# Patient Record
Sex: Female | Born: 1955 | Race: White | Hispanic: No | Marital: Married | Smoking: Current every day smoker
Health system: Southern US, Community
[De-identification: ages and names within clinical notes are randomized; demographics above are authoritative.]

## PROBLEM LIST (undated history)

## (undated) DIAGNOSIS — K529 Noninfective gastroenteritis and colitis, unspecified: Secondary | ICD-10-CM

## (undated) DIAGNOSIS — B1920 Unspecified viral hepatitis C without hepatic coma: Secondary | ICD-10-CM

## (undated) DIAGNOSIS — A159 Respiratory tuberculosis unspecified: Secondary | ICD-10-CM

## (undated) DIAGNOSIS — I10 Essential (primary) hypertension: Secondary | ICD-10-CM

## (undated) DIAGNOSIS — E785 Hyperlipidemia, unspecified: Secondary | ICD-10-CM

## (undated) DIAGNOSIS — H269 Unspecified cataract: Secondary | ICD-10-CM

## (undated) DIAGNOSIS — I509 Heart failure, unspecified: Secondary | ICD-10-CM

## (undated) DIAGNOSIS — J449 Chronic obstructive pulmonary disease, unspecified: Secondary | ICD-10-CM

## (undated) DIAGNOSIS — M1990 Unspecified osteoarthritis, unspecified site: Secondary | ICD-10-CM

## (undated) HISTORY — PX: ABDOMINAL HYSTERECTOMY: SHX81

## (undated) HISTORY — DX: Unspecified viral hepatitis C without hepatic coma: B19.20

## (undated) HISTORY — PX: KNEE SURGERY: SHX244

## (undated) HISTORY — DX: Noninfective gastroenteritis and colitis, unspecified: K52.9

## (undated) HISTORY — DX: Essential (primary) hypertension: I10

## (undated) HISTORY — DX: Unspecified osteoarthritis, unspecified site: M19.90

## (undated) HISTORY — DX: Heart failure, unspecified: I50.9

## (undated) HISTORY — DX: Unspecified cataract: H26.9

## (undated) HISTORY — DX: Respiratory tuberculosis unspecified: A15.9

## (undated) HISTORY — DX: Hyperlipidemia, unspecified: E78.5

## (undated) HISTORY — PX: COLONOSCOPY: SHX174

## (undated) HISTORY — DX: Chronic obstructive pulmonary disease, unspecified: J44.9

## (undated) HISTORY — PX: CATARACT EXTRACTION, BILATERAL: SHX1313

---

## 1999-07-25 ENCOUNTER — Emergency Department (HOSPITAL_COMMUNITY): Admission: EM | Admit: 1999-07-25 | Discharge: 1999-07-25 | Payer: Self-pay | Admitting: Emergency Medicine

## 1999-07-25 ENCOUNTER — Encounter: Payer: Self-pay | Admitting: Emergency Medicine

## 2008-11-17 HISTORY — PX: POLYPECTOMY: SHX149

## 2013-09-20 ENCOUNTER — Ambulatory Visit: Payer: Self-pay

## 2014-03-11 ENCOUNTER — Encounter: Payer: Self-pay | Admitting: Internal Medicine

## 2014-03-11 ENCOUNTER — Ambulatory Visit: Payer: No Typology Code available for payment source | Attending: Internal Medicine | Admitting: Internal Medicine

## 2014-03-11 VITALS — BP 136/86 | HR 87 | Temp 98.3°F | Resp 16 | Ht 66.5 in | Wt 167.0 lb

## 2014-03-11 DIAGNOSIS — F17200 Nicotine dependence, unspecified, uncomplicated: Secondary | ICD-10-CM | POA: Insufficient documentation

## 2014-03-11 DIAGNOSIS — N390 Urinary tract infection, site not specified: Secondary | ICD-10-CM | POA: Insufficient documentation

## 2014-03-11 DIAGNOSIS — Z7189 Other specified counseling: Secondary | ICD-10-CM

## 2014-03-11 DIAGNOSIS — Z7689 Persons encountering health services in other specified circumstances: Secondary | ICD-10-CM

## 2014-03-11 DIAGNOSIS — M069 Rheumatoid arthritis, unspecified: Secondary | ICD-10-CM | POA: Insufficient documentation

## 2014-03-11 DIAGNOSIS — B1920 Unspecified viral hepatitis C without hepatic coma: Secondary | ICD-10-CM | POA: Insufficient documentation

## 2014-03-11 DIAGNOSIS — R21 Rash and other nonspecific skin eruption: Secondary | ICD-10-CM | POA: Insufficient documentation

## 2014-03-11 LAB — POCT URINALYSIS DIPSTICK
Bilirubin, UA: NEGATIVE
Blood, UA: NEGATIVE
Glucose, UA: NEGATIVE
Ketones, UA: NEGATIVE
Leukocytes, UA: NEGATIVE
Nitrite, UA: NEGATIVE
Protein, UA: NEGATIVE
Spec Grav, UA: 1.01
Urobilinogen, UA: 0.2
pH, UA: 6.5

## 2014-03-11 NOTE — Progress Notes (Signed)
Pt is here to establish care. Pt thinks that she may have a UTI. Pt has a history of RA and HEP C

## 2014-03-11 NOTE — Progress Notes (Signed)
Patient ID: Raven Torres, female   DOB: [birth year], 58 y.o.   MRN: [id number]  [id number]  [id number]  DOB - [birth year]  CC:  Chief Complaint  Patient presents with  . Establish Care       HPI: Raven Torres is a 58 y.o. female with a past medical history of Hep C and RA, here today to establish medical care.  Patient presents to clinic today with pictures of her legs that reveal severe edema and rash from 4 months ago.  She states that when she has the rash on her legs it is painful and itchy.  She states that in 2 to 3 days she gets knots in her legs that later turn into a bruise.   Patient reports that she has been taking advil for pain twice a day and that is all she has ever been treated with.   She states that she has noticed odorous urine but denies dysuria, hematuria, fever, chills, or flank pain.                             No Known Allergies Past Medical History  Diagnosis Date  . Arthritis   . Colitis    No current outpatient prescriptions on file prior to visit.   No current facility-administered medications on file prior to visit.   Family History  Problem Relation Age of Onset  . Heart disease Mother   . Cancer Mother    History   Social History  . Marital Status: Legally Separated    Spouse Name: N/A    Number of Children: N/A  . Years of Education: N/A   Occupational History  . Not on file.   Social History Main Topics  . Smoking status: Current Every Day Smoker  . Smokeless tobacco: Not on file  . Alcohol Use: No  . Drug Use: No  . Sexual Activity: No   Other Topics Concern  . Not on file   Social History Narrative  . No narrative on file    Review of Systems: See HPI   Objective:   Filed Vitals:   03/11/14 1530  BP: 136/86  Pulse: 87  Temp: 98.3 F (36.8 C)  Resp: 16    Physical Exam: Constitutional: Patient appears well-developed and well-nourished. No distress. HENT: Normocephalic, atraumatic, External right and  left ear normal. Oropharynx is clear and moist.  Eyes: Conjunctivae and EOM are normal. PERRLA, no scleral icterus. Neck: Normal ROM. Neck supple. No JVD. No tracheal deviation. No thyromegaly. CVS: RRR, S1/S2 +, no murmurs, no gallops, no carotid bruit.  Pulmonary: Effort and breath sounds normal, no stridor, rhonchi, wheezes, rales.  Abdominal: Soft. BS +, no distension, tenderness, rebound or guarding.  Musculoskeletal: Normal range of motion. No edema and no tenderness.  Lymphadenopathy: No lymphadenopathy noted, cervical, Neuro: Alert. Normal reflexes, muscle tone coordination. No cranial nerve deficit. Skin: Skin is warm and dry. No rash noted. Not diaphoretic. No erythema. No pallor. Psychiatric: Normal mood and affect. Behavior, judgment, thought content normal.  No results found for this basename: WBC, HGB, HCT, MCV, PLT   No results found for this basename: CREATININE, BUN, NA, K, CL, CO2    No results found for this basename: HGBA1C   Lipid Panel  No results found for this basename: chol, trig, hdl, cholhdl, vldl, ldlcalc       Assessment and plan:   Jaleah was seen today for establish care.  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  The patient was given clear instructions to go to ER or return to medical center if symptoms don't improve, worsen or new problems develop. The patient verbalized understanding  Chari Manning, NP-C [facility] [phone] 04/12/2014, 1:03 PM

## 2014-03-11 NOTE — Patient Instructions (Addendum)
Smoking Cessation Quitting smoking is important to your health and has many advantages. However, it is not always easy to quit since nicotine is a very addictive drug. Oftentimes, people try 3 times or more before being able to quit. This document explains the best ways for you to prepare to quit smoking. Quitting takes hard work and a lot of effort, but you can do it. ADVANTAGES OF QUITTING SMOKING  You will live longer, feel better, and live better.  Your body will feel the impact of quitting smoking almost immediately.  Within 20 minutes, blood pressure decreases. Your pulse returns to its normal level.  After 8 hours, carbon monoxide levels in the blood return to normal. Your oxygen level increases.  After 24 hours, the chance of having a heart attack starts to decrease. Your breath, hair, and body stop smelling like smoke.  After 48 hours, damaged nerve endings begin to recover. Your sense of taste and smell improve.  After 72 hours, the body is virtually free of nicotine. Your bronchial tubes relax and breathing becomes easier.  After 2 to 12 weeks, lungs can hold more air. Exercise becomes easier and circulation improves.  The risk of having a heart attack, stroke, cancer, or lung disease is greatly reduced.  After 1 year, the risk of coronary heart disease is cut in half.  After 5 years, the risk of stroke falls to the same as a nonsmoker.  After 10 years, the risk of lung cancer is cut in half and the risk of other cancers decreases significantly.  After 15 years, the risk of coronary heart disease drops, usually to the level of a nonsmoker.  If you are pregnant, quitting smoking will improve your chances of having a healthy baby.  The people you live with, especially any children, will be healthier.  You will have extra money to spend on things other than cigarettes. QUESTIONS TO THINK ABOUT BEFORE ATTEMPTING TO QUIT You may want to talk about your answers with your  health care provider.  Why do you want to quit?  If you tried to quit in the past, what helped and what did not?  What will be the most difficult situations for you after you quit? How will you plan to handle them?  Who can help you through the tough times? Your family? Friends? A health care provider?  What pleasures do you get from smoking? What ways can you still get pleasure if you quit? Here are some questions to ask your health care provider:  How can you help me to be successful at quitting?  What medicine do you think would be best for me and how should I take it?  What should I do if I need more help?  What is smoking withdrawal like? How can I get information on withdrawal? GET READY  Set a quit date.  Change your environment by getting rid of all cigarettes, ashtrays, matches, and lighters in your home, car, or work. Do not let people smoke in your home.  Review your past attempts to quit. Think about what worked and what did not. GET SUPPORT AND ENCOURAGEMENT You have a better chance of being successful if you have help. You can get support in many ways.  Tell your family, friends, and coworkers that you are going to quit and need their support. Ask them not to smoke around you.  Get individual, group, or telephone counseling and support. Programs are available at local hospitals and health centers. Call   your local health department for information about programs in your area.  Spiritual beliefs and practices may help some smokers quit.  Download a "quit meter" on your computer to keep track of quit statistics, such as how long you have gone without smoking, cigarettes not smoked, and money saved.  Get a self-help book about quitting smoking and staying off tobacco. [place] yourself from urges to smoke. Talk to someone, go for a walk, or occupy your time with a task.  Change your normal routine. Take a different route to work.  Drink tea instead of coffee. Eat breakfast in a different place.  Reduce your stress. Take a hot bath, exercise, or read a book.  Plan something enjoyable to do every day. Reward yourself for not smoking.  Explore interactive web-based programs that specialize in helping you quit. GET MEDICINE AND USE IT CORRECTLY Medicines can help you stop smoking and decrease the urge to smoke. Combining medicine with the above behavioral methods and support can greatly increase your chances of successfully quitting smoking.  Nicotine replacement therapy helps deliver nicotine to your body without the negative effects and risks of smoking. Nicotine replacement therapy includes nicotine gum, lozenges, inhalers, nasal sprays, and skin patches. Some may be available over-the-counter and others require a prescription.  Antidepressant medicine helps people abstain from smoking, but how this works is unknown. This medicine is available by prescription.  Nicotinic receptor partial agonist medicine simulates the effect of nicotine in your brain. This medicine is available by prescription. Ask your health care provider for advice about which medicines to use and how to use them based on your health history. Your health care provider will tell you what side effects to look out for if you choose to be on a medicine or therapy. Carefully read the information on the package. Do not use any other product containing nicotine while using a nicotine replacement product.  RELAPSE OR DIFFICULT SITUATIONS Most relapses occur within the first 3 months after quitting. Do not be discouraged if you start smoking again. Remember, most people try several times before finally quitting. You may have symptoms of withdrawal because your body is used to nicotine. You may crave cigarettes, be irritable, feel very hungry, cough often, get headaches, or have difficulty concentrating. The withdrawal symptoms are only temporary. They are strongest  when you first quit, but they will go away within 10-14 days. To reduce the chances of relapse, try to:  Avoid drinking alcohol. Drinking lowers your chances of successfully quitting.  Reduce the amount of caffeine you consume. Once you quit smoking, the amount of caffeine in your body increases and can give you symptoms, such as a rapid heartbeat, sweating, and anxiety.  Avoid smokers because they can make you want to smoke.  Do not let weight gain distract you. Many smokers will gain weight when they quit, usually less than 10 pounds. Eat a healthy diet and stay active. You can always lose the weight gained after you quit.  Find ways to improve your mood other than smoking. FOR MORE INFORMATION  www.smokefree.gov  Document Released: 07/16/2001 Document Revised: 12/06/2013 Document Reviewed: 10/31/2011 [facility] Patient Information 2015 [place], [place]. This information is not intended to replace advice given to you by your health care provider. Make sure you discuss any questions you have with your health care provider. Vasculitis Vasculitis is when your blood vessels are inflamed. There are many different blood vessels in the body, and vasculitis can affect  any of them. This includes large (veins and arteries) and small (capillaries) vessels. With vasculitis,   Blood vessel walls can become thick.  Blood vessels can become narrow.  Blood vessels can become weak. Sometimes, it becomes so weak that the blood vessel bulges out like a balloon. This is called an aneurysm. Aneurysms are rare but can be life-threatening.  Scarring can occur.  Not enough blood can flow through the blood vessels. All of these things can damage many parts of the body, including the muscles, kidneys, lungs and brain. There are many types of vasculitis. Some types are short-term (acute), while others are long-term (chronic). Some types may go away without treatment, and others may need to be treated for a long  time.  CAUSES  Vasculitis occurs when the body's immune system (which fights germs and disease) makes a mistake. It attacks its own blood vessels. This causes inflammation (the body's way of reacting to injury or infection).   Why this happens is usually not known. The condition is then called primary vasculitis.  Sometimes, something triggers the inflammation. This is called secondary vasculitis. Possible causes include:  Infections.  An immune system disease. Examples include lupus, rheumatoid arthritis and scleroderma.  An allergic reaction to a medicine.  Cancer that affects blood cells. This includes leukemia and lymphoma.  Males and females of all ages and races can develop vasculitis. Some risk factors make vasculitis more likely. These include:  Smoking.  Stress.  Physical injury. SYMPTOMS  There are more than 20 types of vasculitis. Symptoms of each type vary, but some symptoms are common.  Many people with vasculitis:  Have a fever.  Do not feel like eating.  Lose weight.  Feel very tired.  Have aches and pains.  Feel weak.  Start to not have feeling (numbness) in an area.  Symptoms for some types of vasculitis also could be:  Sores in the mouth or eyes.  Skin problems. This could be sores, spots or rashes.  Trouble seeing.  Trouble breathing.  Blood in the urine.  Headaches.  Pain in the abdomen.  Stuffy or bloody nose. DIAGNOSIS  Vasculitis symptoms are similar to symptoms of many other conditions. That can make it hard to tell if you have vasculitis. To be sure, your caregiver will ask about your symptoms and do a physical exam. Certain tests may be necessary, such as:   A complete blood count (CBC). This test shows how many red blood cells are in your blood. Not having enough red blood cells (anemic) can result from vasculitis.  Erythrocyte sedimentation (also called sed rate test). It measures inflammation in the body.  C reactive  protein (CRP). This also shows if there is inflammation.  Anti-neutrophil cytoplasmic antibodies (ANCA). This can tell if the immune system is reacting to certain cells in the blood.  A urine test. This checks for blood or protein in the urine. That could be a sign of kidney damage from vasculitis.  Imaging tests. These tests create pictures from inside the body. Options include:  X-rays.  Computed tomography (CT) scan. This uses X-rays guided by a computer.  Ultrasound. It creates an image using sound waves.  Magnetic resonance imaging (MRI). It uses radio waves, magnets and a computer.  Angiography. A dye is put into your blood vessels. Then, an X-ray is taken of them.  A biopsy of a blood vessel. This means your caregiver will take out a small piece of a blood vessel. Then, it is checked  under a microscope. This is an important test. It often is the best way to know for sure if you have vasculitis. TREATMENT   Treatment will depend on the type of vasculitis and how severe it is. Often, you will need to see a specialist in immunologic diseases (rheumatologist).  Some types of vasculitis may go away without treatment.  Some types need only over-the-counter drugs.  Prescription medicines are used to treat many types of vasculitis. For example:  Corticosteroids. These are the drugs used most often. They are very powerful. Usually, a high dose is taken until symptoms improve. Then, the dose is gradually decreased. Using corticosteroids for a long time can cause problems. They can make muscles and bones weak. They can cause blood pressure to go up, and cause diabetes. Also, people often gain weight when they take corticosteroids.  Cytotoxic drugs. These kill cells that cause inflammation. Sometimes, they are used if corticosteroids do not help. Other times, both medications are taken.  Surgery. This may be needed to repair a blood vessel that has bulged out (aneurysm).  Treatment can  sometimes cure your disease. Other times, it can put the disease in remission (no symptoms). Increased treatment and reevaluation might be necessary if your disease comes back or flares. HOME CARE INSTRUCTIONS   Take any medications that your caregiver prescribes. Follow the directions carefully.  Watch for any problems that can be caused by a drug (side effects). Tell your caregiver right away if you notice any changes or problems.  Keep all appointments for checkups. This is important to help your caregiver watch for side effects. Checkups may include:  Periodic blood tests.  Bone density testing. This checks how strong or weak your bones are.  Blood pressure checks. If your blood pressure rises, you may need to take a drug to control it while you are taking corticosteroids.  Blood sugar checks. This is to be sure you are not developing diabetes. If you have diabetes, corticosteroid medications may make it worse and require increased treatment.  Exercise. First, talk with your caregiver about what would be OK for you to do. Aerobic exercise (which increases your heart rate) is often suggested. It includes walking. This type of exercise is good because it helps prevent bone loss. It also helps control your blood pressure.  Follow a healthy diet. Include good sources of protein in your diet. Also include fruits, vegetables and whole grains. Your caregiver can refer you to an expert on healthy eating (dietitian) for more detailed advice.  Learn as much as you can about vasculitis. Understanding your condition can help you cope with it. Coping can be hard because this may be something you will have to live with for years.  Consider joining a support group. It often helps to talk about your worries with others who have the same problems.  Tell your caregiver if you feel stressed, anxious or depressed. Your caregiver may refer you to a specialist, or recommend medication to relieve your  symptoms. SEEK MEDICAL CARE IF:   The symptoms that led to your diagnosis return.  You develop worsening fever, fatigue, headache, weight loss or pain in your jaw.  You develop signs of infection. Infections can be worse if you are on corticosteroid medication.  You develop any new or unexplained symptoms of disease. SEEK IMMEDIATE MEDICAL CARE IF:   Your eyesight changes.  Pain does not go away, even after taking medication.  You feel pain in your chest or abdomen.  You  have trouble breathing.  One side of your face or body becomes suddenly weak or numb.  Your nose bleeds.  There is blood in your urine.  You develop a fever of more than 102 F (38.9 C). Document Released: 05/18/2009 Document Revised: 10/14/2011 Document Reviewed: 09/15/2013 [facility] Patient Information 2015 [place], [place]. This information is not intended to replace advice given to you by your health care provider. Make sure you discuss any questions you have with your health care provider.

## 2014-03-12 LAB — CBC WITH DIFFERENTIAL/PLATELET
Basophils Absolute: 0 10*3/uL (ref 0.0–0.1)
Basophils Relative: 0 % (ref 0–1)
Eosinophils Absolute: 0.1 10*3/uL (ref 0.0–0.7)
Eosinophils Relative: 1 % (ref 0–5)
HCT: 47.1 % — ABNORMAL HIGH (ref 36.0–46.0)
Hemoglobin: 16.8 g/dL — ABNORMAL HIGH (ref 12.0–15.0)
Lymphocytes Relative: 31 % (ref 12–46)
Lymphs Abs: 2.8 10*3/uL (ref 0.7–4.0)
MCH: 30.9 pg (ref 26.0–34.0)
MCHC: 35.7 g/dL (ref 30.0–36.0)
MCV: 86.7 fL (ref 78.0–100.0)
Monocytes Absolute: 0.8 10*3/uL (ref 0.1–1.0)
Monocytes Relative: 9 % (ref 3–12)
Neutro Abs: 5.4 10*3/uL (ref 1.7–7.7)
Neutrophils Relative %: 59 % (ref 43–77)
Platelets: 183 10*3/uL (ref 150–400)
RBC: 5.43 MIL/uL — ABNORMAL HIGH (ref 3.87–5.11)
RDW: 13 % (ref 11.5–15.5)
WBC: 9.1 10*3/uL (ref 4.0–10.5)

## 2014-03-12 LAB — COMPLETE METABOLIC PANEL WITH GFR
ALT: 23 U/L (ref 0–35)
AST: 36 U/L (ref 0–37)
Albumin: 4.1 g/dL (ref 3.5–5.2)
Alkaline Phosphatase: 68 U/L (ref 39–117)
BUN: 10 mg/dL (ref 6–23)
CO2: 26 mEq/L (ref 19–32)
Calcium: 9.7 mg/dL (ref 8.4–10.5)
Chloride: 98 mEq/L (ref 96–112)
Creat: 0.68 mg/dL (ref 0.50–1.10)
GFR, Est African American: >89 mL/min
GFR, Est Non African American: >89 mL/min
Glucose, Bld: 78 mg/dL (ref 70–99)
Potassium: 4.9 mEq/L (ref 3.5–5.3)
Sodium: 135 mEq/L (ref 135–145)
Total Bilirubin: 0.4 mg/dL (ref 0.2–1.2)
Total Protein: 9.1 g/dL — ABNORMAL HIGH (ref 6.0–8.3)

## 2014-03-12 LAB — TSH: TSH: 1.35 u[IU]/mL (ref 0.350–4.500)

## 2014-03-12 LAB — SEDIMENTATION RATE: Sed Rate: 73 mm/hr — ABNORMAL HIGH (ref 0–22)

## 2014-03-12 LAB — RHEUMATOID FACTOR: Rheumatoid fact SerPl-aCnc: 87 IU/mL — ABNORMAL HIGH (ref <=14)

## 2014-03-14 LAB — PAN-ANCA
Atypical p-ANCA Screen: NEGATIVE
Myeloperoxidase Abs: <1
Serine Protease 3: <1
c-ANCA Screen: NEGATIVE
p-ANCA Screen: NEGATIVE

## 2014-03-14 LAB — ANA: Anti Nuclear Antibody(ANA): NEGATIVE

## 2014-03-15 LAB — HEPATITIS C RNA QUANTITATIVE
HCV Quantitative Log: 4.77 {Log} — ABNORMAL HIGH (ref <1.18)
HCV Quantitative: 58835 IU/mL — ABNORMAL HIGH (ref <15)

## 2014-03-21 ENCOUNTER — Telehealth: Payer: Self-pay | Admitting: Internal Medicine

## 2014-03-21 NOTE — Telephone Encounter (Signed)
Patient her liver enzymes are within normal limits but she has a high viral load for hepatitis C.  Patient has questions about RA labs and vasculitis. Patient states she is confused. Patient wants to know if she can come in for a visit to discuss her lab results?

## 2014-03-21 NOTE — Telephone Encounter (Signed)
Pt wants to know lab results Please f/u with Patient

## 2014-03-22 ENCOUNTER — Telehealth: Payer: Self-pay | Admitting: *Deleted

## 2014-03-22 DIAGNOSIS — B182 Chronic viral hepatitis C: Secondary | ICD-10-CM

## 2014-03-22 NOTE — Telephone Encounter (Signed)
Message copied by [name] on Tue Mar 22, 2014  3:22 PM ------      Message from: Chari Manning A      Created: Tue Mar 15, 2014 10:45 AM       Please let patient know that her liver enzymes are within normal limits but she has a high viral load for hepatitis C. Please find out if patient wants referral to infectious disease for hepatitis treatment. ------

## 2014-03-22 NOTE — Telephone Encounter (Signed)
Please explain to patient that she has Hep C, if she reports that she has had treatment in the past please explain that it is a virus and it will continue to show up in her system.  I only checked her for RA to confirm her history. 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  You may place order for Infectious disease if she wants further care for her Hep C.

## 2014-03-22 NOTE — Telephone Encounter (Signed)
Message left on patient's mobile VM to return call to discuss results. Home number has been disconnected

## 2014-03-22 NOTE — Telephone Encounter (Signed)
Patient states that she was diagnosed 6 years ago with Hep C but was told that it was minimal and didn't require treatment. Requesting copy of lab results and referral to Infectious Disease. Referral placed.      Message from: Chari Manning A  Created: Tue Mar 15, 2014 10:45 AM  Please let patient know that her liver enzymes are within normal limits but she has a high viral load for hepatitis C. Please find out if patient wants referral to infectious disease for hepatitis treatment.

## 2014-03-22 NOTE — Telephone Encounter (Signed)
Message copied by [name] on Tue Mar 22, 2014  2:19 PM ------      Message from: Chari Manning A      Created: Tue Mar 15, 2014 10:45 AM       Please let patient know that her liver enzymes are within normal limits but she has a high viral load for hepatitis C. Please find out if patient wants referral to infectious disease for hepatitis treatment. ------

## 2014-03-22 NOTE — Telephone Encounter (Signed)
Message copied by [name] on Tue Mar 22, 2014  3:18 PM ------      Message from: Raven Torres      Created: Tue Mar 15, 2014 10:45 AM       Please let patient know that her liver enzymes are within normal limits but she has Torres high viral load for hepatitis C. Please find out if patient wants referral to infectious disease for hepatitis treatment. ------

## 2014-03-30 ENCOUNTER — Other Ambulatory Visit: Payer: No Typology Code available for payment source

## 2014-03-30 DIAGNOSIS — B182 Chronic viral hepatitis C: Secondary | ICD-10-CM

## 2014-03-31 LAB — PROTIME-INR
INR: 1.02 (ref <1.50)
PROTHROMBIN TIME: 13.4 s (ref 11.6–15.2)

## 2014-03-31 LAB — HEPATITIS A ANTIBODY, TOTAL: Hep A Total Ab: NONREACTIVE

## 2014-03-31 LAB — HEPATITIS B SURFACE ANTIGEN: Hepatitis B Surface Ag: NEGATIVE

## 2014-03-31 LAB — HIV ANTIBODY (ROUTINE TESTING W REFLEX): HIV: NONREACTIVE

## 2014-03-31 LAB — HEPATITIS B CORE ANTIBODY, TOTAL: HEP B C TOTAL AB: REACTIVE — AB

## 2014-03-31 LAB — IRON: Iron: 155 ug/dL — ABNORMAL HIGH (ref 42–145)

## 2014-03-31 LAB — HEPATITIS B SURFACE ANTIBODY,QUALITATIVE: Hep B S Ab: POSITIVE — AB

## 2014-05-02 ENCOUNTER — Ambulatory Visit: Payer: No Typology Code available for payment source | Admitting: Internal Medicine

## 2014-05-03 ENCOUNTER — Encounter: Payer: Self-pay | Admitting: Internal Medicine

## 2014-05-03 ENCOUNTER — Ambulatory Visit (INDEPENDENT_AMBULATORY_CARE_PROVIDER_SITE_OTHER): Payer: No Typology Code available for payment source | Admitting: Internal Medicine

## 2014-05-03 VITALS — BP 152/77 | HR 83 | Temp 97.9°F | Ht 65.5 in | Wt 172.0 lb

## 2014-05-03 DIAGNOSIS — Z23 Encounter for immunization: Secondary | ICD-10-CM

## 2014-05-03 DIAGNOSIS — B182 Chronic viral hepatitis C: Secondary | ICD-10-CM

## 2014-05-03 NOTE — Progress Notes (Signed)
+Raven Torres is a 58 y.o. female who presents for initial evaluation and management of a positive Hepatitis C antibody test.  Patient tested positive about 5-6 years ago and told to defer treatment for better options. Hepatitis C risk factors present are: history of blood transfusion (details: during labor in about 1980 and tatto), tattoos (details: in the 1980s). Patient denies intranasal drug use, IV drug abuse, multiple sexual partners. Patient has not had other studies performed. Results: hepatitis C RNA by PCR, result: positive. Patient has not had prior treatment for Hepatitis C. Patient does not have a past history of liver disease. Patient does not have a family history of liver disease.   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  Has had a long history of fatigue.  Is genotype unknown at this time.  Never had a liver biopsy.   Patient does not have documented immunity to Hepatitis A. Patient does have documented immunity to Hepatitis B.     Review of Systems A comprehensive review of systems was negative.   Past Medical History  Diagnosis Date  . Arthritis   . Colitis     Prior to Admission medications   Not on File    No Known Allergies  History  Substance Use Topics  . Smoking status: Current Every Day Smoker -- 0.50 packs/day    Types: Cigarettes  . Smokeless tobacco: Never Used     Comment: cutting back  . Alcohol Use: No    Family History  Problem Relation Age of Onset  . Heart disease Mother   . Cancer Mother       Objective:   Filed Vitals:   05/03/14 1448  BP: 152/77  Pulse: 83  Temp: 97.9 F (36.6 C)   in no apparent distress and alert HEENT: anicteric Cor RRR and No murmurs clear Bowel sounds are normal, liver is not enlarged, spleen is not enlarged peripheral pulses normal, no pedal edema, no clubbing or cyanosis negative for - jaundice, spider hemangioma, telangiectasia, palmar erythema, ecchymosis and  atrophy  Laboratory Genotype:  No results found for this basename: hcvgenotype   HCV viral load:  Lab Results  Component Value Date   HCVQUANT 58835* 03/11/2014   Lab Results  Component Value Date   WBC 9.1 03/11/2014   HGB 16.8* 03/11/2014   HCT 47.1* 03/11/2014   MCV 86.7 03/11/2014   PLT 183 03/11/2014    Lab Results  Component Value Date   CREATININE 0.68 03/11/2014   BUN 10 03/11/2014   NA 135 03/11/2014   K 4.9 03/11/2014   CL 98 03/11/2014   CO2 26 03/11/2014    Lab Results  Component Value Date   ALT 23 03/11/2014   AST 36 03/11/2014   ALKPHOS 68 03/11/2014   BILITOT 0.4 03/11/2014   INR 1.02 03/30/2014      Assessment: Hepatitis C genotype unknown  Plan: 1) Patient counseled extensively on limiting acetaminophen to no more than 2 grams daily, avoidance of alcohol. 2) Transmission discussed with patient including sexual transmission, sharing razors and toothbrush.   3) Will need referral to gastroenterology if concern for cirrhosis 4) Will need referral for substance abuse counseling: No. 5) Will prescribe Harvoni if genotype 1, otherwise limited options through patient assistance.   6) Hepatitis A vaccine Yes.   7) Hepatitis B vaccine No. 8) Pneumovax vaccine if concern for cirrhosis 9) will follow up after starting medication or in 1 year if denied

## 2014-05-05 LAB — HCV RNA QUANT RFLX ULTRA OR GENOTYP
HCV QUANT: 64361 [IU]/mL — AB (ref <15)
HCV Quantitative Log: 4.81 {Log} — ABNORMAL HIGH (ref <1.18)

## 2014-05-09 LAB — HEPATITIS C GENOTYPE: HCV Genotype: 1

## 2014-05-17 ENCOUNTER — Ambulatory Visit (HOSPITAL_COMMUNITY)
Admission: RE | Admit: 2014-05-17 | Discharge: 2014-05-17 | Disposition: A | Discharge status: Home / Self Care | Payer: Medicaid Other | Source: Ambulatory Visit | Attending: Internal Medicine | Admitting: Internal Medicine

## 2014-05-17 DIAGNOSIS — B182 Chronic viral hepatitis C: Secondary | ICD-10-CM | POA: Diagnosis not present

## 2014-05-18 ENCOUNTER — Other Ambulatory Visit: Payer: Self-pay | Admitting: Internal Medicine

## 2014-05-18 MED ORDER — LEDIPASVIR-SOFOSBUVIR 90-400 MG PO TABS: 1.0000 | ORAL_TABLET | Freq: Every day | ORAL | Status: DC

## 2014-05-31 ENCOUNTER — Ambulatory Visit (INDEPENDENT_AMBULATORY_CARE_PROVIDER_SITE_OTHER): Payer: No Typology Code available for payment source | Admitting: Internal Medicine

## 2014-05-31 ENCOUNTER — Encounter: Payer: Self-pay | Admitting: Internal Medicine

## 2014-05-31 VITALS — BP 170/82 | HR 78 | Temp 98.0°F | Wt 174.0 lb

## 2014-05-31 DIAGNOSIS — K7469 Other cirrhosis of liver: Secondary | ICD-10-CM

## 2014-05-31 DIAGNOSIS — K7460 Unspecified cirrhosis of liver: Secondary | ICD-10-CM | POA: Insufficient documentation

## 2014-05-31 DIAGNOSIS — B182 Chronic viral hepatitis C: Secondary | ICD-10-CM

## 2014-05-31 NOTE — Assessment & Plan Note (Signed)
Discussed results.  Needs ultrasound every 6 months.  Will send to GI if she gets medicaid.

## 2014-05-31 NOTE — Patient Instructions (Addendum)
Date 05/31/14  Dear Ms Raven Torres, As discussed in the [facility], your hepatitis C therapy will include the following medications:          Harvoni 90mg /400mg  tablet:           Take 1 tablet by mouth once daily   Please note that ALL MEDICATIONS WILL START ON THE SAME DATE for a total of 12 weeks. 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  Remember, each prescription is for 28 days. ---------------------------------------------------------------- GENERAL NOTES REGARDING YOUR HEPATITIS C MEDICATION:  SOFOSBUVIR/LEDIPASVIR (HARVONI): - Harvoni tablet is taken daily with OR without food. - The tablets are orange. - The tablets should be stored at room temperature.  - Acid reducing agents such as H2 blockers (ie. Pepcid (famotidine), Zantac (ranitidine), Tagamet (cimetidine), Axid (nizatidine) and proton pump inhibitors (ie. Prilosec (omeprazole), Protonix (pantoprazole), Nexium (esomeprazole), or Aciphex (rabeprazole)) can decrease effectiveness of Harvoni. Do not take until you have discussed with a health care provider.    -Antacids that contain magnesium and/or aluminum hydroxide (ie. Milk of Magensia, Rolaids, Gaviscon, Maalox, Mylanta, an dArthritis Pain Formula)can reduce absorption of Harvoni, so take them at least 4 hours before or after Harvoni.  -Calcium carbonate (calcium supplements or antacids such as Tums, Caltrate,  Os-Cal)needs to be taken at least 4 hours hours before or after Harvoni.  -St. John's wort or any products that contain St. John's wort like some herbal supplements  Please inform the office prior to starting any of these medications.  - The common side effects with Harvoni:      1. Fatigue      2. Headache      3. Nausea      4. Diarrhea      5. Insomnia   Support Path is a suite of resources designed to help patients start with HARVONI and move toward treatment completion [place] helps patients access therapy and get off to an efficient start  Benefits investigation and prior authorization support Co-pay and other financial assistance A specialty pharmacy finder CO-PAY COUPON [employer] [place] [employer] [place] co-pay coupon may help eligible patients lower their out-of-pocket costs. With a co-pay coupon, most eligible patients may pay no more than $5 per co-pay (restrictions apply) www.harvoni.com call (807) 540-1210 Not valid for patients enrolled in government healthcare prescription drug programs, such as Medicare Part D and Medicaid. Patients in the coverage gap known as the "donut hole" also are not eligible The HARVONI co-pay coupon program will cover the out-of-pocket costs for HARVONI prescriptions up to a maximum of 25% of the catalog price of a 12-week regimen of HARVONI  Please note that this only lists the most common side effects and is NOT a comprehensive list of the potential side effects of these medications. For more information, please review the drug information sheets that come with your medication package from the pharmacy.  ---------------------------------------------------------------- GENERAL HELPFUL HINTS ON HCV THERAPY: 1. No alcohol. 2. Protect against sun-sensitivity/sunburns (wear sunglasses, hat, long sleeves, pants and sunscreen).  3. Stay well-hydrated/well-moisturized. 4. Notify the [facility] of any changes in your other over-the-counter/herbal or  prescription medications. 5. If you miss a dose of your medication, take the missed dose as soon as you remember. Return to your regular time/dose schedule the next day.  6.  Do not stop taking your medications without first talking with your healthcare provider. 7.  You may take Tylenol (acetaminophen), as long as the dose is less than 2000 mg (OR no more than 4 tablets of the Tylenol Extra Strengths 500mg  tablet) in 24 hours. 8.  You will need to obtain routine labs and/or office visits at [facility] at weeks 2, 4, 8,  and 12 as well as 12 and 24 weeks after completion of treatment.   Scharlene Gloss, [facility] [place], [place]  [phone]

## 2014-05-31 NOTE — Progress Notes (Signed)
   Subjective:    Patient ID: Raven Torres, female    DOB: [birth year], 58 y.o.   MRN: [id number]  HPI Here for follow up of Hep C.  Had elastography and is F3/4.  Ultrasound with contours suggestive of cirrhosis.  Genotype 1, viral load 64,361.  Have sent Harvoni to [facility] for drug assistance.     Review of Systems  Constitutional: Negative for fatigue.  Gastrointestinal: Negative for diarrhea and abdominal distention.  Musculoskeletal: Positive for arthralgias.  Skin: Negative for rash.  Neurological: Negative for dizziness and light-headedness.       Objective:   Physical Exam  Constitutional: She appears well-developed and well-nourished. No distress.  HENT:  Mouth/Throat: No oropharyngeal exudate.  Eyes: No scleral icterus.  Cardiovascular: Normal rate, regular rhythm and normal heart sounds.   No murmur heard. Pulmonary/Chest: Effort normal and breath sounds normal. No respiratory distress.  Lymphadenopathy:    She has no cervical adenopathy.  Skin: No rash noted.          Assessment & Plan:

## 2014-05-31 NOTE — Assessment & Plan Note (Signed)
Waiting for approval for Harvoni.  RTC after starting medicaiton.

## 2014-06-06 ENCOUNTER — Ambulatory Visit: Payer: No Typology Code available for payment source | Attending: Internal Medicine

## 2014-07-08 ENCOUNTER — Telehealth: Payer: Self-pay | Admitting: Internal Medicine

## 2014-07-08 NOTE — Telephone Encounter (Signed)
Patient presented in clinic to ask for copies and results for her x-rays that were performed at [facility]. Please contact patient.

## 2014-07-13 NOTE — Telephone Encounter (Signed)
Patient presented in clinic to ask for copies and results for her x-rays that were performed at [facility]. Please contact patient.

## 2014-07-13 NOTE — Telephone Encounter (Signed)
If she is referring to the hepatic ultrasound, she will need to get results from ID. Thanks

## 2014-07-15 ENCOUNTER — Telehealth: Payer: Self-pay | Admitting: Emergency Medicine

## 2014-07-15 NOTE — Telephone Encounter (Signed)
Pt informed she will need to call ID doctor to get imaging

## 2014-08-15 ENCOUNTER — Ambulatory Visit: Payer: No Typology Code available for payment source | Admitting: Internal Medicine

## 2014-08-22 ENCOUNTER — Encounter: Payer: Self-pay | Admitting: Internal Medicine

## 2014-08-22 ENCOUNTER — Ambulatory Visit: Payer: Medicaid Other | Attending: Internal Medicine | Admitting: Internal Medicine

## 2014-08-22 VITALS — BP 174/84 | HR 81 | Temp 97.6°F | Resp 18 | Ht 65.0 in | Wt 180.0 lb

## 2014-08-22 DIAGNOSIS — Z720 Tobacco use: Secondary | ICD-10-CM

## 2014-08-22 DIAGNOSIS — F17200 Nicotine dependence, unspecified, uncomplicated: Secondary | ICD-10-CM

## 2014-08-22 DIAGNOSIS — F17210 Nicotine dependence, cigarettes, uncomplicated: Secondary | ICD-10-CM | POA: Insufficient documentation

## 2014-08-22 DIAGNOSIS — R030 Elevated blood-pressure reading, without diagnosis of hypertension: Secondary | ICD-10-CM

## 2014-08-22 DIAGNOSIS — I10 Essential (primary) hypertension: Secondary | ICD-10-CM | POA: Diagnosis not present

## 2014-08-22 DIAGNOSIS — M545 Low back pain: Secondary | ICD-10-CM | POA: Insufficient documentation

## 2014-08-22 DIAGNOSIS — M069 Rheumatoid arthritis, unspecified: Secondary | ICD-10-CM | POA: Diagnosis present

## 2014-08-22 DIAGNOSIS — IMO0001 Reserved for inherently not codable concepts without codable children: Secondary | ICD-10-CM

## 2014-08-22 DIAGNOSIS — B1920 Unspecified viral hepatitis C without hepatic coma: Secondary | ICD-10-CM | POA: Diagnosis not present

## 2014-08-22 MED ORDER — IBUPROFEN 600 MG PO TABS: 600.0000 mg | ORAL_TABLET | Freq: Three times a day (TID) | ORAL | Status: DC | PRN

## 2014-08-22 MED ORDER — PREDNISONE 10 MG PO TABS: 10.0000 mg | ORAL_TABLET | Freq: Every day | ORAL | Status: DC

## 2014-08-22 NOTE — Patient Instructions (Signed)
Smoking Cessation Quitting smoking is important to your health and has many advantages. However, it is not always easy to quit since nicotine is a very addictive drug. Oftentimes, people try 3 times or more before being able to quit. This document explains the best ways for you to prepare to quit smoking. Quitting takes hard work and a lot of effort, but you can do it. ADVANTAGES OF QUITTING SMOKING  You will live longer, feel better, and live better.  Your body will feel the impact of quitting smoking almost immediately.  Within 20 minutes, blood pressure decreases. Your pulse returns to its normal level.  After 8 hours, carbon monoxide levels in the blood return to normal. Your oxygen level increases.  After 24 hours, the chance of having a heart attack starts to decrease. Your breath, hair, and body stop smelling like smoke.  After 48 hours, damaged nerve endings begin to recover. Your sense of taste and smell improve.  After 72 hours, the body is virtually free of nicotine. Your bronchial tubes relax and breathing becomes easier.  After 2 to 12 weeks, lungs can hold more air. Exercise becomes easier and circulation improves.  The risk of having a heart attack, stroke, cancer, or lung disease is greatly reduced.  After 1 year, the risk of coronary heart disease is cut in half.  After 5 years, the risk of stroke falls to the same as a nonsmoker.  After 10 years, the risk of lung cancer is cut in half and the risk of other cancers decreases significantly.  After 15 years, the risk of coronary heart disease drops, usually to the level of a nonsmoker.  If you are pregnant, quitting smoking will improve your chances of having a healthy baby.  The people you live with, especially any children, will be healthier.  You will have extra money to spend on things other than cigarettes. QUESTIONS TO THINK ABOUT BEFORE ATTEMPTING TO QUIT You may want to talk about your answers with your  health care provider.  Why do you want to quit?  If you tried to quit in the past, what helped and what did not?  What will be the most difficult situations for you after you quit? How will you plan to handle them?  Who can help you through the tough times? Your family? Friends? A health care provider?  What pleasures do you get from smoking? What ways can you still get pleasure if you quit? Here are some questions to ask your health care provider:  How can you help me to be successful at quitting?  What medicine do you think would be best for me and how should I take it?  What should I do if I need more help?  What is smoking withdrawal like? How can I get information on withdrawal? GET READY  Set a quit date.  Change your environment by getting rid of all cigarettes, ashtrays, matches, and lighters in your home, car, or work. Do not let people smoke in your home.  Review your past attempts to quit. Think about what worked and what did not. GET SUPPORT AND ENCOURAGEMENT You have a better chance of being successful if you have help. You can get support in many ways.  Tell your family, friends, and coworkers that you are going to quit and need their support. Ask them not to smoke around you.  Get individual, group, or telephone counseling and support. Programs are available at local hospitals and health centers. Call   your local health department for information about programs in your area.  Spiritual beliefs and practices may help some smokers quit.  Download a "quit meter" on your computer to keep track of quit statistics, such as how long you have gone without smoking, cigarettes not smoked, and money saved.  Get a self-help book about quitting smoking and staying off tobacco. LEARN NEW SKILLS AND BEHAVIORS  Distract yourself from urges to smoke. Talk to someone, go for a walk, or occupy your time with a task.  Change your normal routine. Take a different route to work.  Drink tea instead of coffee. Eat breakfast in a different place.  Reduce your stress. Take a hot bath, exercise, or read a book.  Plan something enjoyable to do every day. Reward yourself for not smoking.  Explore interactive web-based programs that specialize in helping you quit. GET MEDICINE AND USE IT CORRECTLY Medicines can help you stop smoking and decrease the urge to smoke. Combining medicine with the above behavioral methods and support can greatly increase your chances of successfully quitting smoking.  Nicotine replacement therapy helps deliver nicotine to your body without the negative effects and risks of smoking. Nicotine replacement therapy includes nicotine gum, lozenges, inhalers, nasal sprays, and skin patches. Some may be available over-the-counter and others require a prescription.  Antidepressant medicine helps people abstain from smoking, but how this works is unknown. This medicine is available by prescription.  Nicotinic receptor partial agonist medicine simulates the effect of nicotine in your brain. This medicine is available by prescription. Ask your health care provider for advice about which medicines to use and how to use them based on your health history. Your health care provider will tell you what side effects to look out for if you choose to be on a medicine or therapy. Carefully read the information on the package. Do not use any other product containing nicotine while using a nicotine replacement product.  RELAPSE OR DIFFICULT SITUATIONS Most relapses occur within the first 3 months after quitting. Do not be discouraged if you start smoking again. Remember, most people try several times before finally quitting. You may have symptoms of withdrawal because your body is used to nicotine. You may crave cigarettes, be irritable, feel very hungry, cough often, get headaches, or have difficulty concentrating. The withdrawal symptoms are only temporary. They are strongest  when you first quit, but they will go away within 10-14 days. To reduce the chances of relapse, try to:  Avoid drinking alcohol. Drinking lowers your chances of successfully quitting.  Reduce the amount of caffeine you consume. Once you quit smoking, the amount of caffeine in your body increases and can give you symptoms, such as a rapid heartbeat, sweating, and anxiety.  Avoid smokers because they can make you want to smoke.  Do not let weight gain distract you. Many smokers will gain weight when they quit, usually less than 10 pounds. Eat a healthy diet and stay active. You can always lose the weight gained after you quit.  Find ways to improve your mood other than smoking. FOR MORE INFORMATION  www.smokefree.gov  Document Released: 07/16/2001 Document Revised: 12/06/2013 Document Reviewed: 10/31/2011 ExitCare Patient Information 2015 [employer]. This information is not intended to replace advice given to you by your health care provider. Make sure you discuss any questions you have with your health care provider.  

## 2014-08-22 NOTE — Progress Notes (Signed)
Patient ID: Raven Torres, female   DOB: 1956-01-31, 59 y.o.   MRN: [id number]  CC: RA flare  HPI: Raven Torres is a 59 y.o. female here today for a follow up visit.  Patient has past medical history of Rheumatoid arthritis, Hepatitis C, tobacco use, and elevated blood pressures. She presents to clinic today with progressive lower back pain. Denies bowel/bladder dysfunction. She states that a few days ago she bent down to pick something up off the floor and she had a severe pain in her back.  She notes several nodules in her hands that have been painful. She reports that the pain has been so bad she has been laying around in bed all day.  She reports that she is afraid to take medications because she is being treated with Harvoni for Hep C. She would like a referral if possible to a rheumatologist.   Patient has No headache, No chest pain, No abdominal pain - No Nausea, No new weakness tingling or numbness, No Cough - SOB.  No Known Allergies Past Medical History  Diagnosis Date  . Arthritis   . Colitis   . Hepatitis C    Current Outpatient Prescriptions on File Prior to Visit  Medication Sig Dispense Refill  . Ledipasvir-Sofosbuvir (HARVONI) 90-400 MG TABS Take 1 tablet by mouth daily. 28 tablet 2   No current facility-administered medications on file prior to visit.   Family History  Problem Relation Age of Onset  . Heart disease Mother   . Cancer Mother    History   Social History  . Marital Status: Married    Spouse Name: N/A    Number of Children: N/A  . Years of Education: N/A   Occupational History  . Not on file.   Social History Main Topics  . Smoking status: Current Every Day Smoker -- 0.50 packs/day    Types: Cigarettes  . Smokeless tobacco: Never Used     Comment: cutting back  . Alcohol Use: No  . Drug Use: No  . Sexual Activity: No   Other Topics Concern  . Not on file   Social History Narrative    Review of Systems: See HPI.    Objective:    Filed Vitals:   08/22/14 1432  BP: 174/84  Pulse: 81  Temp: 97.6 F (36.4 C)  Resp: 18    Physical Exam  Constitutional: She is oriented to person, place, and time.  Cardiovascular: Normal rate and normal heart sounds.   Pulmonary/Chest: Effort normal and breath sounds normal.  Abdominal: Soft. There is no tenderness.  Musculoskeletal: Normal range of motion. She exhibits no edema.  Neurological: She is alert and oriented to person, place, and time.  Skin: There is erythema (knuckles).  Multiple subcutaneous rheumatoid nodules on bilateral palmer surface of hands, non tender   Psychiatric: She has a normal mood and affect.     Lab Results  Component Value Date   WBC 9.1 03/11/2014   HGB 16.8* 03/11/2014   HCT 47.1* 03/11/2014   MCV 86.7 03/11/2014   PLT 183 03/11/2014   Lab Results  Component Value Date   CREATININE 0.68 03/11/2014   BUN 10 03/11/2014   NA 135 03/11/2014   K 4.9 03/11/2014   CL 98 03/11/2014   CO2 26 03/11/2014    No results found for: HGBA1C Lipid Panel  No results found for: CHOL, TRIG, HDL, CHOLHDL, VLDL, LDLCALC     Assessment and plan:   Su was seen  today for follow-up, back pain, hypertension and referral.  Diagnoses and associated orders for this visit:  Rheumatoid arthritis - Ambulatory referral to Rheumatology - predniSONE (DELTASONE) 10 MG tablet; Take 1 tablet (10 mg total) by mouth daily with breakfast. 6-5-4-3-2-1. Take 6 pills today and decrease by one pill per day - ibuprofen (ADVIL,MOTRIN) 600 MG tablet; Take 1 tablet (600 mg total) by mouth every 8 (eight) hours as needed.  Elevated BP Unsure if patient BP is elevated due to pain. I will given patient medication and have her to return in 1-2 weeks for a recheck. If still elevated I will start her on lisinopril 10 mg.   Smoking Smoking cessation discussed for 3 minutes, patient is not willing to quit at this time. Will continue to assess on each visit. Discussed  increased risk for diseases such as cancer, heart disease, and stroke.    Return if symptoms worsen or fail to improve.        Chari Manning, NP-C [facility] [phone] 08/22/2014, 3:06 PM

## 2014-08-22 NOTE — Progress Notes (Signed)
Pt comes in requesting Rheumatology referral for chronic arthritis with worsening pain in mid back radiating to lower back x 3 weeks States she is having throbbing lower back pain affecting daily activities Pain in hands noted as well Pt isn't taking pain reliever Currently smoking daily with elevated BP- 174/84 82 c/o headaches  Need PNA vaccine/Pap smear per health maintenance

## 2014-08-23 ENCOUNTER — Ambulatory Visit (INDEPENDENT_AMBULATORY_CARE_PROVIDER_SITE_OTHER): Payer: Medicaid Other | Admitting: Internal Medicine

## 2014-08-23 ENCOUNTER — Encounter: Payer: Self-pay | Admitting: Internal Medicine

## 2014-08-23 ENCOUNTER — Telehealth: Payer: Self-pay | Admitting: *Deleted

## 2014-08-23 VITALS — BP 193/84 | HR 91 | Temp 97.5°F | Wt 179.0 lb

## 2014-08-23 DIAGNOSIS — K7460 Unspecified cirrhosis of liver: Secondary | ICD-10-CM

## 2014-08-23 DIAGNOSIS — B182 Chronic viral hepatitis C: Secondary | ICD-10-CM

## 2014-08-23 DIAGNOSIS — IMO0001 Reserved for inherently not codable concepts without codable children: Secondary | ICD-10-CM | POA: Insufficient documentation

## 2014-08-23 DIAGNOSIS — R030 Elevated blood-pressure reading, without diagnosis of hypertension: Secondary | ICD-10-CM

## 2014-08-23 LAB — CBC WITH DIFFERENTIAL/PLATELET
Basophils Absolute: 0 10*3/uL (ref 0.0–0.1)
Basophils Relative: 0 % (ref 0–1)
EOS ABS: 0.1 10*3/uL (ref 0.0–0.7)
Eosinophils Relative: 1 % (ref 0–5)
HCT: 49.1 % — ABNORMAL HIGH (ref 36.0–46.0)
Hemoglobin: 17.2 g/dL — ABNORMAL HIGH (ref 12.0–15.0)
LYMPHS ABS: 3 10*3/uL (ref 0.7–4.0)
LYMPHS PCT: 34 % (ref 12–46)
MCH: 30.8 pg (ref 26.0–34.0)
MCHC: 35 g/dL (ref 30.0–36.0)
MCV: 88 fL (ref 78.0–100.0)
MONO ABS: 0.7 10*3/uL (ref 0.1–1.0)
MPV: 10 fL (ref 8.6–12.4)
Monocytes Relative: 8 % (ref 3–12)
NEUTROS ABS: 5 10*3/uL (ref 1.7–7.7)
Neutrophils Relative %: 57 % (ref 43–77)
Platelets: 171 10*3/uL (ref 150–400)
RBC: 5.58 MIL/uL — ABNORMAL HIGH (ref 3.87–5.11)
RDW: 13.3 % (ref 11.5–15.5)
WBC: 8.7 10*3/uL (ref 4.0–10.5)

## 2014-08-23 LAB — COMPLETE METABOLIC PANEL WITH GFR
ALT: 11 U/L (ref 0–35)
AST: 24 U/L (ref 0–37)
Albumin: 3.7 g/dL (ref 3.5–5.2)
Alkaline Phosphatase: 62 U/L (ref 39–117)
BUN: 9 mg/dL (ref 6–23)
CALCIUM: 9.4 mg/dL (ref 8.4–10.5)
CO2: 26 mEq/L (ref 19–32)
Chloride: 103 mEq/L (ref 96–112)
Creat: 0.65 mg/dL (ref 0.50–1.10)
GFR, Est Non African American: >89 mL/min
Glucose, Bld: 82 mg/dL (ref 70–99)
Potassium: 3.9 mEq/L (ref 3.5–5.3)
SODIUM: 135 meq/L (ref 135–145)
TOTAL PROTEIN: 8.2 g/dL (ref 6.0–8.3)
Total Bilirubin: 0.6 mg/dL (ref 0.2–1.2)

## 2014-08-23 NOTE — Assessment & Plan Note (Signed)
She is doing well with her Harvoni. She is about one month into it and we'll check her labs today. She will return in 1 month for follow-up and I'll consider a CMP at that time. I will call her with the results of today's labs.

## 2014-08-23 NOTE — Telephone Encounter (Signed)
  Left voice message to return call   Advice patient to come back for a BP recheck soon.. If BP is still elevated then we may place her on medication.

## 2014-08-23 NOTE — Progress Notes (Signed)
   Subjective:    Patient ID: Raven Torres, female    DOB: October 25, 1955, 59 y.o.   MRN: [id number]  HPI She is here for follow-up of hepatitis C. 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 Her elastography was consistent with F3 to F4. She since has received Medicaid and now is on Harvoni. She is been on this about 1 month. No issues. Takes daily.   Review of Systems  Constitutional: Negative for fatigue and unexpected weight change.  Gastrointestinal: Negative for nausea.  Skin: Negative for rash.  Neurological: Negative for dizziness and light-headedness.       Objective:   Physical Exam  Constitutional: She appears well-developed and well-nourished. No distress.  Eyes: No scleral icterus.  Cardiovascular: Normal rate, regular rhythm and normal heart sounds.   No murmur heard. Pulmonary/Chest: Effort normal and breath sounds normal. No respiratory distress.  Skin: No rash noted.          Assessment & Plan:

## 2014-08-23 NOTE — Assessment & Plan Note (Signed)
She will need an ultrasound every 6 months with the next due in April.

## 2014-08-24 LAB — HEPATITIS C RNA QUANTITATIVE: HCV QUANT: NOT DETECTED [IU]/mL (ref <15)

## 2014-08-29 ENCOUNTER — Telehealth: Payer: Self-pay | Admitting: *Deleted

## 2014-08-29 NOTE — Telephone Encounter (Signed)
Patient notified of lab results Myrtis Hopping CMA

## 2014-08-29 NOTE — Telephone Encounter (Signed)
-----   Message from Thayer Headings, MD sent at 08/29/2014 10:08 AM EST ----- Please let her know his hep C viral load is undectectable and the medicien is working as expected.  thanks

## 2014-09-22 ENCOUNTER — Ambulatory Visit (INDEPENDENT_AMBULATORY_CARE_PROVIDER_SITE_OTHER): Payer: Medicaid Other | Admitting: Internal Medicine

## 2014-09-22 ENCOUNTER — Encounter: Payer: Self-pay | Admitting: Internal Medicine

## 2014-09-22 VITALS — BP 147/96 | HR 76 | Temp 97.7°F | Ht 66.0 in | Wt 183.0 lb

## 2014-09-22 DIAGNOSIS — B182 Chronic viral hepatitis C: Secondary | ICD-10-CM

## 2014-09-22 DIAGNOSIS — K7460 Unspecified cirrhosis of liver: Secondary | ICD-10-CM

## 2014-09-23 LAB — COMPREHENSIVE METABOLIC PANEL
ALT: 11 U/L (ref 0–35)
AST: 23 U/L (ref 0–37)
Albumin: 3.8 g/dL (ref 3.5–5.2)
Alkaline Phosphatase: 73 U/L (ref 39–117)
BILIRUBIN TOTAL: 0.4 mg/dL (ref 0.2–1.2)
BUN: 9 mg/dL (ref 6–23)
CO2: 30 meq/L (ref 19–32)
Calcium: 9.4 mg/dL (ref 8.4–10.5)
Chloride: 103 mEq/L (ref 96–112)
Creat: 0.68 mg/dL (ref 0.50–1.10)
GLUCOSE: 99 mg/dL (ref 70–99)
POTASSIUM: 3.9 meq/L (ref 3.5–5.3)
SODIUM: 138 meq/L (ref 135–145)
Total Protein: 7.4 g/dL (ref 6.0–8.3)

## 2014-09-23 NOTE — Progress Notes (Signed)
   Subjective:    Patient ID: Raven Torres, female    DOB: [birth year], 59 y.o.   MRN: [id number]  HPI She is here for follow-up of hepatitis C. 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 Her elastography was consistent with F3 to F4. She since has received Medicaid and now is on Harvoni. She is been on this about 2 months. No issues. Takes daily.  Initial viral load undetectable.  Some fatigue.  Waiting for end of treatment to start treatment for RA.     Review of Systems  Constitutional: Negative for fatigue and unexpected weight change.  Gastrointestinal: Negative for nausea.  Skin: Negative for rash.  Neurological: Negative for dizziness and light-headedness.       Objective:   Physical Exam  Constitutional: She appears well-developed and well-nourished. No distress.  Eyes: No scleral icterus.  Cardiovascular: Normal rate, regular rhythm and normal heart sounds.   No murmur heard. Pulmonary/Chest: Effort normal and breath sounds normal. No respiratory distress.  Skin: No rash noted.          Assessment & Plan:

## 2014-09-23 NOTE — Assessment & Plan Note (Addendum)
She will need ultrasound every 6 months and also Pneumovax next visit.  I will refer her to gastroenterology after the next visit as well.

## 2014-09-23 NOTE — Assessment & Plan Note (Signed)
She is doing well, will check her CMP to make sure there is no side effects and then she will come back after treatment completion for end of treatment labs. She knows then will be 3 and 6 months posttreatment.

## 2014-10-12 ENCOUNTER — Telehealth: Payer: Self-pay | Admitting: *Deleted

## 2014-10-12 NOTE — Telephone Encounter (Signed)
I spoke to the pt she informed me that she would be getting her last treatment next week. I told her to call me after she is retested and I would place the referral.

## 2014-10-12 NOTE — Telephone Encounter (Signed)
-----   Message from Lance Bosch, NP sent at 10/11/2014  1:09 PM EST ----- Call patient and let her know to call us after completion of Hep C treatment and retesting for rheumatology referral. They will not accept her until she has completed treatment.

## 2014-10-27 ENCOUNTER — Ambulatory Visit (INDEPENDENT_AMBULATORY_CARE_PROVIDER_SITE_OTHER): Payer: Medicaid Other | Admitting: Internal Medicine

## 2014-10-27 ENCOUNTER — Encounter: Payer: Self-pay | Admitting: Internal Medicine

## 2014-10-27 VITALS — BP 176/92 | HR 80 | Temp 97.7°F | Wt 190.2 lb

## 2014-10-27 DIAGNOSIS — Z23 Encounter for immunization: Secondary | ICD-10-CM

## 2014-10-27 DIAGNOSIS — B182 Chronic viral hepatitis C: Secondary | ICD-10-CM | POA: Diagnosis not present

## 2014-10-27 DIAGNOSIS — K7460 Unspecified cirrhosis of liver: Secondary | ICD-10-CM | POA: Diagnosis not present

## 2014-10-27 DIAGNOSIS — M069 Rheumatoid arthritis, unspecified: Secondary | ICD-10-CM | POA: Diagnosis not present

## 2014-10-27 NOTE — Progress Notes (Signed)
   Subjective:    Patient ID: Raven Torres, female    DOB: [birth year], 59 y.o.   MRN: [id number]  HPI She is here for follow-up of hepatitis C. 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 Her elastography was consistent with F3 to F4. She has completed 12 weeks of Harvoni. Initial viral load undetectable.      Review of Systems  Constitutional: Negative for fatigue and unexpected weight change.  Gastrointestinal: Negative for nausea.  Skin: Negative for rash.  Neurological: Negative for dizziness and light-headedness.       Objective:   Physical Exam  Constitutional: She appears well-developed and well-nourished. No distress.  Eyes: No scleral icterus.  Cardiovascular: Normal rate, regular rhythm and normal heart sounds.   No murmur heard. Pulmonary/Chest: Effort normal and breath sounds normal. No respiratory distress.  Skin: No rash noted.          Assessment & Plan:

## 2014-10-27 NOTE — Assessment & Plan Note (Signed)
Completed treatment for hep C and now to get RA treatment.

## 2014-10-27 NOTE — Assessment & Plan Note (Signed)
Will do [place] screening prior to next visit and every 6 months.

## 2014-10-27 NOTE — Assessment & Plan Note (Signed)
End of treatment viral load today and SVR12 in 3 months.

## 2014-10-31 ENCOUNTER — Telehealth: Payer: Self-pay | Admitting: *Deleted

## 2014-10-31 NOTE — Telephone Encounter (Signed)
-----   Message from Ileana Roup, RN sent at 10/27/2014  2:51 PM EDT ----- Regarding: pt may need reminding to schedule ABD U/S at [facility] Dr. Linus Salmons would like her to have the U/S prior to coming back for her next visit in 3 months.  She will also need lab work as well.  Thank you.  Langley Gauss

## 2014-11-01 ENCOUNTER — Ambulatory Visit: Payer: Self-pay

## 2014-12-16 ENCOUNTER — Encounter: Payer: Self-pay | Admitting: Internal Medicine

## 2014-12-16 ENCOUNTER — Ambulatory Visit: Payer: Medicaid Other | Attending: Internal Medicine | Admitting: Internal Medicine

## 2014-12-16 VITALS — BP 150/80 | HR 85 | Temp 98.0°F | Ht 66.0 in | Wt 187.0 lb

## 2014-12-16 DIAGNOSIS — M069 Rheumatoid arthritis, unspecified: Secondary | ICD-10-CM

## 2014-12-16 DIAGNOSIS — I10 Essential (primary) hypertension: Secondary | ICD-10-CM | POA: Diagnosis not present

## 2014-12-16 DIAGNOSIS — M25512 Pain in left shoulder: Secondary | ICD-10-CM

## 2014-12-16 MED ORDER — PREDNISONE 10 MG PO TABS: 10.0000 mg | ORAL_TABLET | Freq: Every day | ORAL | Status: DC

## 2014-12-16 MED ORDER — CLONIDINE HCL 0.1 MG PO TABS
0.1000 mg | ORAL_TABLET | Freq: Once | ORAL | Status: AC
  Administered 2014-12-16: 0.1 mg via ORAL

## 2014-12-16 MED ORDER — LISINOPRIL 10 MG PO TABS: 10.0000 mg | ORAL_TABLET | Freq: Every day | ORAL | Status: DC

## 2014-12-16 MED ORDER — TRAMADOL HCL 50 MG PO TABS: 50.0000 mg | ORAL_TABLET | Freq: Two times a day (BID) | ORAL | Status: DC | PRN

## 2014-12-16 NOTE — Progress Notes (Signed)
Patient ID: Raven Torres, female   DOB: 03-23-1956, 59 y.o.   MRN: [id number]  CC: BP, left arm/shoulder pain   HPI: Raven Torres is a 59 y.o. female here today for a follow up visit.  Patient has past medical history of Hep C and rheumatoid arthritis. She reports that she went to her Infectious Disease doctor recently and was found to have a elevated BP. She states that she was told that she is unable to see a Rheumatologist until she has completed Hep C treatment. Today she has pain in her left arm and shoulder. The pain is severe and she has limited ROM. The pain is constantly achy and throbbing. Patient states that she knows the pain is from her arthritis and is unset that she has been unable to get treatment with Rheumatologist. She reports that her ID doctor has given her clearance for RA treatment but they have refused to see her.   Patient has No headache, No chest pain, No abdominal pain - No Nausea, No new weakness tingling or numbness, No Cough - SOB.  No Known Allergies Past Medical History  Diagnosis Date  . Arthritis   . Colitis   . Hepatitis C    Current Outpatient Prescriptions on File Prior to Visit  Medication Sig Dispense Refill  . ibuprofen (ADVIL,MOTRIN) 600 MG tablet Take 1 tablet (600 mg total) by mouth every 8 (eight) hours as needed. 60 tablet 0   No current facility-administered medications on file prior to visit.   Family History  Problem Relation Age of Onset  . Heart disease Mother   . Cancer Mother    History   Social History  . Marital Status: Married    Spouse Name: N/A  . Number of Children: N/A  . Years of Education: N/A   Occupational History  . Not on file.   Social History Main Topics  . Smoking status: Current Every Day Smoker -- 1.00 packs/day    Types: Cigarettes  . Smokeless tobacco: Never Used     Comment: cutting back  . Alcohol Use: No  . Drug Use: No  . Sexual Activity: No   Other Topics Concern  . Not on file   Social  History Narrative    Review of Systems: See HPI.    Objective:   Filed Vitals:   12/16/14 1409  BP: 185/79  Pulse: 85  Temp: 98 F (36.7 C)    Physical Exam  Constitutional: She is oriented to person, place, and time.  Cardiovascular: Normal rate, regular rhythm and normal heart sounds.   Pulmonary/Chest: Effort normal and breath sounds normal.  Musculoskeletal: She exhibits tenderness (left shoulder and arm). She exhibits no edema.  Neurological: She is alert and oriented to person, place, and time.  Skin: Skin is warm and dry.     Lab Results  Component Value Date   WBC 8.7 08/23/2014   HGB 17.2* 08/23/2014   HCT 49.1* 08/23/2014   MCV 88.0 08/23/2014   PLT 171 08/23/2014   Lab Results  Component Value Date   CREATININE 0.68 09/22/2014   BUN 9 09/22/2014   NA 138 09/22/2014   K 3.9 09/22/2014   CL 103 09/22/2014   CO2 30 09/22/2014    No results found for: HGBA1C Lipid Panel  No results found for: CHOL, TRIG, HDL, CHOLHDL, VLDL, LDLCALC     Assessment and plan:   Patirica was seen today for left arm pain.  Diagnoses and all orders for  this visit:  Essential hypertension Orders: -     cloNIDine (CATAPRES) tablet 0.1 mg; Take 1 tablet (0.1 mg total) by mouth once in office -    Begin lisinopril (PRINIVIL,ZESTRIL) 10 MG tablet; Take 1 tablet (10 mg total) by mouth daily. Patient will begin treatment with Lisinopril and come back in 2 weeks for repeat pressure.   Explained to patient diet modifications that may contribute to elevated BP. Patient will avoid foods that are high in sodium such as  canned soups and vegetables, tomato juice, commercial baked goods, commercially prepared frozen or canned entrees and sauces. Avoid salty snacks, added salt when cooking, and substituting for low sodium herbs or spices Explained exercise regimen of cardio at least three times weekly to help lower BP and cholesterol  Left shoulder pain Pain related to her RA. I will  give short dose of pain medication to help her until she is able to get in with Rheumatology.  Rheumatoid arthritis Orders: -    Begin traMADol (ULTRAM) 50 MG tablet; Take 1 tablet (50 mg total) by mouth every 12 (twelve) hours as needed. -    Begin predniSONE (DELTASONE) 10 MG tablet; Take 1 tablet (10 mg total) by mouth daily with breakfast. 6-5-4-3-2-1. Take 6 pills today and decrease by one pill per day  Return in about 2 weeks (around 12/30/2014) for Nurse Visit-BP check and 3 mo PCP.      Chari Manning, NP-C [facility] [phone] 12/16/2014, 2:23 PM

## 2014-12-16 NOTE — Progress Notes (Signed)
Patient here complaining of left arm/shoulder pain She has Rheumatoid arthritis and feels like this is related She is taking ibuprofen and with no relief Patient has medicaid and you gave her referral to rheumatologist and she said a case manager called her and said that when he finished with Dr. Linus Salmons infectious disease doctor she could then go to the rheumatologist.

## 2014-12-16 NOTE — Patient Instructions (Signed)
Rheumatoid Arthritis °Rheumatoid arthritis is a long-term (chronic) inflammatory disease that causes pain, swelling, and stiffness of the joints. It can affect the entire body, including the eyes and lungs. The effects of rheumatoid arthritis vary widely among those with the condition. °CAUSES  °The cause of rheumatoid arthritis is not known. It tends to run in families and is more common in women. Certain cells of the body's natural defense system (immune system) do not work properly and begin to attack healthy joints. It primarily involves the connective tissue that lines the joints (synovial membrane). This can cause damage to the joint. °SYMPTOMS  °· Pain, stiffness, swelling, and decreased motion of many joints, especially in the hands and feet. °· Stiffness that is worse in the morning. It may last 1-2 hours or longer. °· Numbness and tingling in the hands. °· Fatigue. °· Loss of appetite. °· Weight loss. °· Low-grade fever. °· Dry eyes and mouth. °· Firm lumps (rheumatoid nodules) that grow beneath the skin in areas such as the elbows and hands. °DIAGNOSIS  °Diagnosis is based on the symptoms described, an exam, and blood tests. Sometimes, X-rays are helpful. °TREATMENT  °The goals of treatment are to relieve pain, reduce inflammation, and to slow down or stop joint damage and disability. Methods vary and may include: °· Maintaining a balance of rest, exercise, and proper nutrition. °· Medicines: °¨ Pain relievers (analgesics). °¨ Corticosteroids and nonsteroidal anti-inflammatory drugs (NSAIDs) to reduce inflammation. °¨ Disease-modifying antirheumatic drugs (DMARDs) to try to slow the course of the disease. °¨ Biologic response modifiers to reduce inflammation and damage. °· Physical therapy and occupational therapy. °· Surgery for patients with severe joint damage. Joint replacement or fusing of joints may be needed. °· Routine monitoring and ongoing care, such as office visits, blood and urine tests, and  X-rays. °HOME CARE INSTRUCTIONS  °· Remain physically active and reduce activity when the disease gets worse. °· Eat a well-balanced diet. °· Put heat on affected joints when you wake up and before activities. Keep the heat on the affected joint for as long as directed by your health care provider. °· Put ice on affected joints following activities or exercising. °¨ Put ice in a plastic bag. °¨ Place a towel between your skin and the bag. °¨ Leave the ice on for 15-20 minutes, 3-4 times per day, or as directed by your health care provider. °· Take medicines and supplements only as directed by your health care provider. °· Use splints as directed by your health care provider. Splints help maintain joint position and function. °· Do not sleep with pillows under your knees. This may lead to spasms. °· Participate in a self-management program to keep current with the latest treatment and coping skills. °SEEK IMMEDIATE MEDICAL CARE IF: °· You have fainting episodes. °· You have periods of extreme weakness. °· You rapidly develop a hot, painful joint that is more severe than usual joint aches. °· You have chills. °· You have a fever. °FOR MORE INFORMATION  °· [employer] Rheumatology: www.rheumatology.org °· Arth[employer] Foundation: www.arthritis.org °Document Released: 07/19/2000 Document Revised: 12/06/2013 Document Reviewed: 08/28/2011 °[employer]® Patient Information ©2015 [employer]. This information is not intended to replace advice given to you by your health care provider. Make sure you discuss any questions you have with your health care provider. ° °

## 2015-01-04 ENCOUNTER — Other Ambulatory Visit: Payer: Self-pay | Admitting: Internal Medicine

## 2015-01-11 ENCOUNTER — Other Ambulatory Visit: Payer: Self-pay | Admitting: Internal Medicine

## 2015-01-12 ENCOUNTER — Other Ambulatory Visit (INDEPENDENT_AMBULATORY_CARE_PROVIDER_SITE_OTHER): Payer: Medicaid Other

## 2015-01-12 DIAGNOSIS — B182 Chronic viral hepatitis C: Secondary | ICD-10-CM

## 2015-01-13 ENCOUNTER — Ambulatory Visit
Admission: RE | Admit: 2015-01-13 | Discharge: 2015-01-13 | Disposition: A | Discharge status: Home / Self Care | Payer: Medicaid Other | Source: Ambulatory Visit | Attending: Internal Medicine | Admitting: Internal Medicine

## 2015-01-13 DIAGNOSIS — K7460 Unspecified cirrhosis of liver: Secondary | ICD-10-CM

## 2015-01-13 LAB — HEPATITIS C RNA QUANTITATIVE: HCV Quantitative: NOT DETECTED IU/mL (ref <15)

## 2015-01-26 ENCOUNTER — Encounter: Payer: Self-pay | Admitting: Internal Medicine

## 2015-01-26 ENCOUNTER — Ambulatory Visit (INDEPENDENT_AMBULATORY_CARE_PROVIDER_SITE_OTHER): Payer: Medicaid Other | Admitting: Internal Medicine

## 2015-01-26 VITALS — BP 147/75 | HR 78 | Temp 97.4°F | Ht 66.0 in | Wt 190.0 lb

## 2015-01-26 DIAGNOSIS — B182 Chronic viral hepatitis C: Secondary | ICD-10-CM

## 2015-01-26 DIAGNOSIS — K7460 Unspecified cirrhosis of liver: Secondary | ICD-10-CM | POA: Diagnosis not present

## 2015-01-26 NOTE — Progress Notes (Signed)
   Subjective:    Patient ID: Raven Torres, female    DOB: November 17, 1955, 59 y.o.   MRN: [id number]  HPI She is here for follow-up of hepatitis C. 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 Her elastography was consistent with F3 to F4. She has completed 12 weeks of Harvoni. Initial viral load undetectable.  End of treatment viral load not done but SVR12 done and is undetectable.  Also with screening [place] for [facility] and negative for masses.     Review of Systems  Constitutional: Negative for fatigue and unexpected weight change.  Gastrointestinal: Negative for nausea.  Skin: Negative for rash.  Neurological: Negative for dizziness and light-headedness.       Objective:   Physical Exam  Constitutional: She appears well-developed and well-nourished. No distress.  Eyes: No scleral icterus.  Cardiovascular: Normal rate, regular rhythm and normal heart sounds.   No murmur heard. Pulmonary/Chest: Effort normal and breath sounds normal. No respiratory distress.  Skin: No rash noted.          Assessment & Plan:

## 2015-01-26 NOTE — Assessment & Plan Note (Signed)
Will continue [place] every 6 months for [facility] screening.

## 2015-01-26 NOTE — Assessment & Plan Note (Signed)
SVR12 negative confers 99.6% probability of cure.  Will check again in 6 months when she returns for [facility] screening.

## 2015-01-27 ENCOUNTER — Ambulatory Visit: Payer: Medicaid Other | Attending: Internal Medicine | Admitting: *Deleted

## 2015-01-27 VITALS — BP 133/66 | HR 84 | Temp 97.5°F | Resp 16 | Ht 66.0 in | Wt 189.4 lb

## 2015-01-27 DIAGNOSIS — F17210 Nicotine dependence, cigarettes, uncomplicated: Secondary | ICD-10-CM | POA: Diagnosis not present

## 2015-01-27 DIAGNOSIS — I10 Essential (primary) hypertension: Secondary | ICD-10-CM | POA: Diagnosis present

## 2015-01-27 NOTE — Patient Instructions (Signed)
Smoking Cessation, Tips for Success If you are ready to quit smoking, congratulations! You have chosen to help yourself be healthier. Cigarettes bring nicotine, tar, carbon monoxide, and other irritants into your body. Your lungs, heart, and blood vessels will be able to work better without these poisons. There are many different ways to quit smoking. Nicotine gum, nicotine patches, a nicotine inhaler, or nicotine nasal spray can help with physical craving. Hypnosis, support groups, and medicines help break the habit of smoking. WHAT THINGS CAN I DO TO MAKE QUITTING EASIER?  Here are some tips to help you quit for good:  Pick a date when you will quit smoking completely. Tell all of your friends and family about your plan to quit on that date.  Do not try to slowly cut down on the number of cigarettes you are smoking. Pick a quit date and quit smoking completely starting on that day.  Throw away all cigarettes.   Clean and remove all ashtrays from your home, work, and car.  On a card, write down your reasons for quitting. Carry the card with you and read it when you get the urge to smoke.  Cleanse your body of nicotine. Drink enough water and fluids to keep your urine clear or pale yellow. Do this after quitting to flush the nicotine from your body.  Learn to predict your moods. Do not let a bad situation be your excuse to have a cigarette. Some situations in your life might tempt you into wanting a cigarette.  Never have "just one" cigarette. It leads to wanting another and another. Remind yourself of your decision to quit.  Change habits associated with smoking. If you smoked while driving or when feeling stressed, try other activities to replace smoking. Stand up when drinking your coffee. Brush your teeth after eating. Sit in a different chair when you read the paper. Avoid alcohol while trying to quit, and try to drink fewer caffeinated beverages. Alcohol and caffeine may urge you to  smoke.  Avoid foods and drinks that can trigger a desire to smoke, such as sugary or spicy foods and alcohol.  Ask people who smoke not to smoke around you.  Have something planned to do right after eating or having a cup of coffee. For example, plan to take a walk or exercise.  Try a relaxation exercise to calm you down and decrease your stress. Remember, you may be tense and nervous for the first 2 weeks after you quit, but this will pass.  Find new activities to keep your hands busy. Play with a pen, coin, or rubber band. Doodle or draw things on paper.  Brush your teeth right after eating. This will help cut down on the craving for the taste of tobacco after meals. You can also try mouthwash.   Use oral substitutes in place of cigarettes. Try using lemon drops, carrots, cinnamon sticks, or chewing gum. Keep them handy so they are available when you have the urge to smoke.  When you have the urge to smoke, try deep breathing.  Designate your home as a nonsmoking area.  If you are a heavy smoker, ask your health care provider about a prescription for nicotine chewing gum. It can ease your withdrawal from nicotine.  Reward yourself. Set aside the cigarette money you save and buy yourself something nice.  Look for support from others. Join a support group or smoking cessation program. Ask someone at home or at work to help you with your plan   to quit smoking.  Always ask yourself, "Do I need this cigarette or is this just a reflex?" Tell yourself, "Today, I choose not to smoke," or "I do not want to smoke." You are reminding yourself of your decision to quit.  Do not replace cigarette smoking with electronic cigarettes (commonly called e-cigarettes). The safety of e-cigarettes is unknown, and some may contain harmful chemicals.  If you relapse, do not give up! Plan ahead and think about what you will do the next time you get the urge to smoke. HOW WILL I FEEL WHEN I QUIT SMOKING? You  may have symptoms of withdrawal because your body is used to nicotine (the addictive substance in cigarettes). You may crave cigarettes, be irritable, feel very hungry, cough often, get headaches, or have difficulty concentrating. The withdrawal symptoms are only temporary. They are strongest when you first quit but will go away within 10-14 days. When withdrawal symptoms occur, stay in control. Think about your reasons for quitting. Remind yourself that these are signs that your body is healing and getting used to being without cigarettes. Remember that withdrawal symptoms are easier to treat than the major diseases that smoking can cause.  Even after the withdrawal is over, expect periodic urges to smoke. However, these cravings are generally short lived and will go away whether you smoke or not. Do not smoke! WHAT RESOURCES ARE AVAILABLE TO HELP ME QUIT SMOKING? Your health care provider can direct you to community resources or hospitals for support, which may include:  Group support.  Education.  Hypnosis.  Therapy. Document Released: 04/19/2004 Document Revised: 12/06/2013 Document Reviewed: 01/07/2013 [facility] Patient Information 2015 [place], [place]. This information is not intended to replace advice given to you by your health care provider. Make sure you discuss any questions you have with your health care provider. Smoking Cessation Quitting smoking is important to your health and has many advantages. However, it is not always easy to quit since nicotine is a very addictive drug. Oftentimes, people try 3 times or more before being able to quit. This document explains the best ways for you to prepare to quit smoking. Quitting takes hard work and a lot of effort, but you can do it. ADVANTAGES OF QUITTING SMOKING  You will live longer, feel better, and live better.  Your body will feel the impact of quitting smoking almost immediately.  Within 20 minutes, blood pressure decreases. Your  pulse returns to its normal level.  After 8 hours, carbon monoxide levels in the blood return to normal. Your oxygen level increases.  After 24 hours, the chance of having a heart attack starts to decrease. Your breath, hair, and body stop smelling like smoke.  After 48 hours, damaged nerve endings begin to recover. Your sense of taste and smell improve.  After 72 hours, the body is virtually free of nicotine. Your bronchial tubes relax and breathing becomes easier.  After 2 to 12 weeks, lungs can hold more air. Exercise becomes easier and circulation improves.  The risk of having a heart attack, stroke, cancer, or lung disease is greatly reduced.  After 1 year, the risk of coronary heart disease is cut in half.  After 5 years, the risk of stroke falls to the same as a nonsmoker.  After 10 years, the risk of lung cancer is cut in half and the risk of other cancers decreases significantly.  After 15 years, the risk of coronary heart disease drops, usually to the level of a nonsmoker.  If  you are pregnant, quitting smoking will improve your chances of having a healthy baby.  The people you live with, especially any children, will be healthier.  You will have extra money to spend on things other than cigarettes. QUESTIONS TO THINK ABOUT BEFORE ATTEMPTING TO QUIT You may want to talk about your answers with your health care provider.  Why do you want to quit?  If you tried to quit in the past, what helped and what did not?  What will be the most difficult situations for you after you quit? How will you plan to handle them?  Who can help you through the tough times? Your family? Friends? A health care provider?  What pleasures do you get from smoking? What ways can you still get pleasure if you quit? Here are some questions to ask your health care provider:  How can you help me to be successful at quitting?  What medicine do you think would be best for me and how should I take  it?  What should I do if I need more help?  What is smoking withdrawal like? How can I get information on withdrawal? GET READY  Set a quit date.  Change your environment by getting rid of all cigarettes, ashtrays, matches, and lighters in your home, car, or work. Do not let people smoke in your home.  Review your past attempts to quit. Think about what worked and what did not. GET SUPPORT AND ENCOURAGEMENT You have a better chance of being successful if you have help. You can get support in many ways.  Tell your family, friends, and coworkers that you are going to quit and need their support. Ask them not to smoke around you.  Get individual, group, or telephone counseling and support. Programs are available at [employer] and health centers. Call your local health department for information about programs in your area.  Spiritual beliefs and practices may help some smokers quit.  Download a "quit meter" on your computer to keep track of quit statistics, such as how long you have gone without smoking, cigarettes not smoked, and money saved.  Get a self-help book about quitting smoking and staying off tobacco. [place] yourself from urges to smoke. Talk to someone, go for a walk, or occupy your time with a task.  Change your normal routine. Take a different route to work. Drink tea instead of coffee. Eat breakfast in a different place.  Reduce your stress. Take a hot bath, exercise, or read a book.  Plan something enjoyable to do every day. Reward yourself for not smoking.  Explore interactive web-based programs that specialize in helping you quit. GET MEDICINE AND USE IT CORRECTLY Medicines can help you stop smoking and decrease the urge to smoke. Combining medicine with the above behavioral methods and support can greatly increase your chances of successfully quitting smoking.  Nicotine replacement therapy helps deliver nicotine to your body  without the negative effects and risks of smoking. Nicotine replacement therapy includes nicotine gum, lozenges, inhalers, nasal sprays, and skin patches. Some may be available over-the-counter and others require a prescription.  Antidepressant medicine helps people abstain from smoking, but how this works is unknown. This medicine is available by prescription.  Nicotinic receptor partial agonist medicine simulates the effect of nicotine in your brain. This medicine is available by prescription. Ask your health care provider for advice about which medicines to use and how to use them based on your health  history. Your health care provider will tell you what side effects to look out for if you choose to be on a medicine or therapy. Carefully read the information on the package. Do not use any other product containing nicotine while using a nicotine replacement product.  RELAPSE OR DIFFICULT SITUATIONS Most relapses occur within the first 3 months after quitting. Do not be discouraged if you start smoking again. Remember, most people try several times before finally quitting. You may have symptoms of withdrawal because your body is used to nicotine. You may crave cigarettes, be irritable, feel very hungry, cough often, get headaches, or have difficulty concentrating. The withdrawal symptoms are only temporary. They are strongest when you first quit, but they will go away within 10-14 days. To reduce the chances of relapse, try to:  Avoid drinking alcohol. Drinking lowers your chances of successfully quitting.  Reduce the amount of caffeine you consume. Once you quit smoking, the amount of caffeine in your body increases and can give you symptoms, such as a rapid heartbeat, sweating, and anxiety.  Avoid smokers because they can make you want to smoke.  Do not let weight gain distract you. Many smokers will gain weight when they quit, usually less than 10 pounds. Eat a healthy diet and stay active. You  can always lose the weight gained after you quit.  Find ways to improve your mood other than smoking. FOR MORE INFORMATION  www.smokefree.gov  Document Released: 07/16/2001 Document Revised: 12/06/2013 Document Reviewed: 10/31/2011 [facility] Patient Information 2015 [place], [place]. This information is not intended to replace advice given to you by your health care provider. Make sure you discuss any questions you have with your health care provider. DASH Eating Plan DASH stands for "Dietary Approaches to Stop Hypertension." The DASH eating plan is a healthy eating plan that has been shown to reduce high blood pressure (hypertension). Additional health benefits may include reducing the risk of type 2 diabetes mellitus, heart disease, and stroke. The DASH eating plan may also help with weight loss. WHAT DO I NEED TO KNOW ABOUT THE DASH EATING PLAN? For the DASH eating plan, you will follow these general guidelines:  Choose foods with a percent daily value for sodium of less than 5% (as listed on the food label).  Use salt-free seasonings or herbs instead of table salt or sea salt.  Check with your health care provider or pharmacist before using salt substitutes.  Eat lower-sodium products, often labeled as "lower sodium" or "no salt added."  Eat fresh foods.  Eat more vegetables, fruits, and low-fat dairy products.  Choose whole grains. Look for the word "whole" as the first word in the ingredient list.  Choose fish and skinless chicken or [place] more often than red meat. Limit fish, poultry, and meat to 6 oz (170 g) each day.  Limit sweets, desserts, sugars, and sugary drinks.  Choose heart-healthy fats.  Limit cheese to 1 oz (28 g) per day.  Eat more home-cooked food and less restaurant, buffet, and fast food.  Limit fried foods.  Cook foods using methods other than frying.  Limit canned vegetables. If you do use them, rinse them well to decrease the sodium.  When eating at  a restaurant, ask that your food be prepared with less salt, or no salt if possible. WHAT FOODS CAN I EAT? Seek help from a dietitian for individual calorie needs. Grains Whole grain or whole wheat bread. Brown rice. Whole grain or whole wheat pasta. Quinoa, bulgur, and  whole grain cereals. Low-sodium cereals. Corn or whole wheat flour tortillas. Whole grain cornbread. Whole grain crackers. Low-sodium crackers. Vegetables Fresh or frozen vegetables (raw, steamed, roasted, or grilled). Low-sodium or reduced-sodium tomato and vegetable juices. Low-sodium or reduced-sodium tomato sauce and paste. Low-sodium or reduced-sodium canned vegetables.  Fruits All fresh, canned (in natural juice), or frozen fruits. Meat and Other Protein Products Ground beef (85% or leaner), grass-fed beef, or beef trimmed of fat. Skinless chicken or [place]. Ground chicken or [place]. Pork trimmed of fat. All fish and seafood. Eggs. Dried beans, peas, or lentils. Unsalted nuts and seeds. Unsalted canned beans. Dairy Low-fat dairy products, such as skim or 1% milk, 2% or reduced-fat cheeses, low-fat ricotta or cottage cheese, or plain low-fat yogurt. Low-sodium or reduced-sodium cheeses. Fats and Oils Tub margarines without trans fats. Light or reduced-fat mayonnaise and salad dressings (reduced sodium). Avocado. Safflower, olive, or canola oils. Natural peanut or almond butter. Other Unsalted popcorn and pretzels. The items listed above may not be a complete list of recommended foods or beverages. Contact your dietitian for more options. WHAT FOODS ARE NOT RECOMMENDED? Grains White bread. White pasta. White rice. Refined cornbread. Bagels and croissants. Crackers that contain trans fat. Vegetables Creamed or fried vegetables. Vegetables in a cheese sauce. Regular canned vegetables. Regular canned tomato sauce and paste. Regular tomato and vegetable juices. Fruits Dried fruits. Canned fruit in light or heavy syrup. Fruit  juice. Meat and Other Protein Products Fatty cuts of meat. Ribs, chicken wings, bacon, sausage, bologna, salami, chitterlings, fatback, hot dogs, bratwurst, and packaged luncheon meats. Salted nuts and seeds. Canned beans with salt. Dairy Whole or 2% milk, cream, half-and-half, and cream cheese. Whole-fat or sweetened yogurt. Full-fat cheeses or blue cheese. Nondairy creamers and whipped toppings. Processed cheese, cheese spreads, or cheese curds. Condiments Onion and garlic salt, seasoned salt, table salt, and sea salt. Canned and packaged gravies. Worcestershire sauce. Tartar sauce. Barbecue sauce. Teriyaki sauce. Soy sauce, including reduced sodium. Steak sauce. Fish sauce. Oyster sauce. Cocktail sauce. Horseradish. Ketchup and mustard. Meat flavorings and tenderizers. Bouillon cubes. Hot sauce. Tabasco sauce. Marinades. Taco seasonings. Relishes. Fats and Oils Butter, stick margarine, lard, shortening, ghee, and bacon fat. Coconut, palm kernel, or palm oils. Regular salad dressings. Other Pickles and olives. Salted popcorn and pretzels. The items listed above may not be a complete list of foods and beverages to avoid. Contact your dietitian for more information. WHERE CAN I FIND MORE INFORMATION? National Heart, Lung, and Blood Institute: travelstabloid.com Document Released: 07/11/2011 Document Revised: 12/06/2013 Document Reviewed: 05/26/2013 [facility] Patient Information 2015 [place], [place]. This information is not intended to replace advice given to you by your health care provider. Make sure you discuss any questions you have with your health care provider.

## 2015-01-27 NOTE — Progress Notes (Signed)
Patient presents for BP check after starting lisinopril 10 mg daily Patient states she loves salt but is trying to lower salt intake Discussed need for low sodium diet and using Mrs. Dash as alternative to salt Discussed walking 30 minutes per day for exercise. States she's doing some stretches in AM and walking around the house. C/o arthritis pain left shoulder moving down left arm; rates 9/10 at present Wanting to see rheumatologist for RA Smoking 15-20 cigs per day. States smoking more due to pain C/o swelling below left knee X 3-4 weeks C/o orange urine. Patient has appt with PCP to discuss  Filed Vitals:   01/27/15 1424  BP: 133/66  Pulse: 84  Temp: 97.5 F (36.4 C)  Resp: 16     Patient advised to call for med refills at least 7 days before running out so as not to go without.  Patient aware that she is to f/u with PCP 3 months from last visit (Due 03/18/15)  Patient given literature on DASH Eating Plan, Smoking Cessation and Smoking Cessation Tips for Success and given 1-800- QUIT-NOW

## 2015-02-03 ENCOUNTER — Other Ambulatory Visit: Payer: Self-pay | Admitting: Internal Medicine

## 2015-02-03 ENCOUNTER — Ambulatory Visit: Payer: Medicaid Other | Attending: Internal Medicine | Admitting: Internal Medicine

## 2015-02-03 VITALS — BP 137/73 | HR 89 | Temp 97.7°F | Resp 16 | Wt 191.6 lb

## 2015-02-03 DIAGNOSIS — R8299 Other abnormal findings in urine: Secondary | ICD-10-CM | POA: Diagnosis not present

## 2015-02-03 DIAGNOSIS — Z1239 Encounter for other screening for malignant neoplasm of breast: Secondary | ICD-10-CM

## 2015-02-03 DIAGNOSIS — I10 Essential (primary) hypertension: Secondary | ICD-10-CM | POA: Insufficient documentation

## 2015-02-03 DIAGNOSIS — Z720 Tobacco use: Secondary | ICD-10-CM | POA: Diagnosis not present

## 2015-02-03 DIAGNOSIS — N398 Other specified disorders of urinary system: Secondary | ICD-10-CM | POA: Insufficient documentation

## 2015-02-03 DIAGNOSIS — F17200 Nicotine dependence, unspecified, uncomplicated: Secondary | ICD-10-CM

## 2015-02-03 DIAGNOSIS — Z79899 Other long term (current) drug therapy: Secondary | ICD-10-CM | POA: Insufficient documentation

## 2015-02-03 DIAGNOSIS — F17210 Nicotine dependence, cigarettes, uncomplicated: Secondary | ICD-10-CM | POA: Insufficient documentation

## 2015-02-03 DIAGNOSIS — R82998 Other abnormal findings in urine: Secondary | ICD-10-CM

## 2015-02-03 DIAGNOSIS — M069 Rheumatoid arthritis, unspecified: Secondary | ICD-10-CM

## 2015-02-03 LAB — POCT URINALYSIS DIPSTICK
Bilirubin, UA: NEGATIVE
Blood, UA: NEGATIVE
GLUCOSE UA: NEGATIVE
KETONES UA: NEGATIVE
LEUKOCYTES UA: NEGATIVE
NITRITE UA: NEGATIVE
Protein, UA: NEGATIVE
Spec Grav, UA: 1.025
Urobilinogen, UA: 1
pH, UA: 5.5

## 2015-02-03 MED ORDER — IBUPROFEN 600 MG PO TABS: 600.0000 mg | ORAL_TABLET | Freq: Three times a day (TID) | ORAL | Status: DC | PRN

## 2015-02-03 NOTE — Progress Notes (Signed)
  Pt present today for discolored urine for a couple of days.

## 2015-02-03 NOTE — Progress Notes (Signed)
Patient ID: Raven Torres, female   DOB: July 24, 1956, 59 y.o.   MRN: [id number]  CC: discolored urine  HPI: Raven Torres is a 59 y.o. female here today for a follow up visit.  Patient has past medical history of Hep C (undected viral load), rheumatoid arthritis, and hypertension. Patient would like evaluation of dark urine that she has had for the past several years. Patient reports that she only notices on his urine during her first void of the morning. She denies dysuria, fever, chills, flank pain.  Patient also like referral back to rheumatology now that she has completed her hep C treatment and has undetectable viral load. Patient is a needed for mammogram.  Allergies  Allergen Reactions  . Codeine Nausea Only  . Tramadol Nausea Only   Past Medical History  Diagnosis Date  . Arthritis   . Colitis   . Hepatitis C    Current Outpatient Prescriptions on File Prior to Visit  Medication Sig Dispense Refill  . lisinopril (PRINIVIL,ZESTRIL) 10 MG tablet Take 1 tablet (10 mg total) by mouth daily. 30 tablet 3  . acetaminophen (TYLENOL) 500 MG tablet Take 500-1,000 mg by mouth daily.    Marland Kitchen ibuprofen (ADVIL,MOTRIN) 600 MG tablet Take 1 tablet (600 mg total) by mouth every 8 (eight) hours as needed. (Patient not taking: Reported on 02/03/2015) 60 tablet 0   No current facility-administered medications on file prior to visit.   Family History  Problem Relation Age of Onset  . Heart disease Mother   . Cancer Mother    History   Social History  . Marital Status: Married    Spouse Name: N/A  . Number of Children: N/A  . Years of Education: N/A   Occupational History  . Not on file.   Social History Main Topics  . Smoking status: Current Every Day Smoker -- 1.00 packs/day    Types: Cigarettes  . Smokeless tobacco: Never Used     Comment: smoking 15-20 cigs daily  . Alcohol Use: No  . Drug Use: No  . Sexual Activity: No   Other Topics Concern  . Not on file   Social History  Narrative    Review of Systems  Eyes: Negative for blurred vision.  Genitourinary: Negative for dysuria, urgency, frequency, hematuria and flank pain.  Musculoskeletal: Positive for myalgias and joint pain.  Neurological: Negative for dizziness and headaches.  All other systems reviewed and are negative.  Objective:   Filed Vitals:   02/03/15 1430  BP: 137/73  Pulse: 89  Temp: 97.7 F (36.5 C)  Resp: 16    Physical Exam  Constitutional: She is oriented to person, place, and time.  Cardiovascular: Normal rate, regular rhythm and normal heart sounds.   Pulmonary/Chest: Effort normal and breath sounds normal.  Abdominal: Soft. She exhibits no distension. There is no tenderness.  Musculoskeletal: She exhibits no edema or tenderness.  Negative CVA tenderness  Neurological: She is alert and oriented to person, place, and time.     Lab Results  Component Value Date   WBC 8.7 08/23/2014   HGB 17.2* 08/23/2014   HCT 49.1* 08/23/2014   MCV 88.0 08/23/2014   PLT 171 08/23/2014   Lab Results  Component Value Date   CREATININE 0.68 09/22/2014   BUN 9 09/22/2014   NA 138 09/22/2014   K 3.9 09/22/2014   CL 103 09/22/2014   CO2 30 09/22/2014    No results found for: HGBA1C Lipid Panel  No results found  for: CHOL, TRIG, HDL, CHOLHDL, VLDL, LDLCALC     Assessment and plan:   Raven Torres was seen today for urinary problems.  Diagnoses and all orders for this visit:  Dark urine Orders: -     POCT urinalysis dipstick Explained to patient urine may be more concentrated after sitting and bladder overnight. She will patient that urine is negative for blood and bacteria no infection noted.  Rheumatoid arthritis Orders: -     Ambulatory referral to Rheumatology -     ibuprofen (ADVIL,MOTRIN) 600 MG tablet; Take 1 tablet (600 mg total) by mouth every 8 (eight) hours as needed. Patient has now completed hep C treatment and is safe to see rheumatology.  Essential  hypertension Continue current medication--lisinopril  Tobacco use disorder Smoking cessation discussed for 3 minutes, patient is not willing to quit at this time. Will continue to assess on each visit. Discussed increased risk for diseases such as cancer, heart disease, and stroke.   Breast cancer screening Orders: -     MM Digital Screening; Future   Return if symptoms worsen or fail to improve.       Raven Manning, NP-C [facility] [phone] 02/03/2015, 2:49 PM

## 2015-02-03 NOTE — Patient Instructions (Signed)
I have placed referral to Rheumatology and for a mammogram.  I would call in one week if you have not heard back.

## 2015-02-07 ENCOUNTER — Encounter: Payer: Self-pay | Admitting: Internal Medicine

## 2015-02-23 ENCOUNTER — Ambulatory Visit
Admission: RE | Admit: 2015-02-23 | Discharge: 2015-02-23 | Disposition: A | Discharge status: Home / Self Care | Payer: Medicaid Other | Source: Ambulatory Visit | Attending: Rheumatology | Admitting: Rheumatology

## 2015-02-23 ENCOUNTER — Other Ambulatory Visit: Payer: Self-pay | Admitting: Rheumatology

## 2015-02-23 DIAGNOSIS — M545 Low back pain: Secondary | ICD-10-CM

## 2015-03-02 ENCOUNTER — Encounter: Payer: Self-pay | Admitting: Internal Medicine

## 2015-03-02 ENCOUNTER — Ambulatory Visit: Payer: Medicaid Other | Attending: Internal Medicine | Admitting: Internal Medicine

## 2015-03-02 VITALS — BP 130/72 | HR 84 | Temp 98.2°F | Resp 18 | Ht 66.0 in | Wt 192.0 lb

## 2015-03-02 DIAGNOSIS — F329 Major depressive disorder, single episode, unspecified: Secondary | ICD-10-CM | POA: Insufficient documentation

## 2015-03-02 DIAGNOSIS — F32A Depression, unspecified: Secondary | ICD-10-CM

## 2015-03-02 DIAGNOSIS — M797 Fibromyalgia: Secondary | ICD-10-CM | POA: Insufficient documentation

## 2015-03-02 MED ORDER — AMITRIPTYLINE HCL 25 MG PO TABS: 25.0000 mg | ORAL_TABLET | Freq: Every day | ORAL | Status: DC

## 2015-03-02 NOTE — Patient Instructions (Addendum)
Please take Amitriptyline at bedtime. This will help with pain over time. It is not a medication you take when having pain but a drug you take over time to reduce the perception of pain   Do not take cyclobenzaprine and amitriptyline together.   We will follow up in 8 weeks to see if you have noticed any improvement

## 2015-03-02 NOTE — Progress Notes (Signed)
Seen rheumatologist last week, ruled out RA. Thinks patient may have fibromyalgia.  Patient needs referral to see specialist for fibromyalgia.  Patient complains of pain "all over", achy and tender to touch, at level 9.   Patient has had BP medication today.

## 2015-03-02 NOTE — Progress Notes (Signed)
Patient ID: Raven Torres, female   DOB: 04-04-1956, 59 y.o.   MRN: [id number]  CC: generalized pain  HPI: Raven Torres is a 59 y.o. female here today for a follow up visit.  Patient has past medical history of arthritis and resolved Hepatitis C. Patient was recently seen by Rheumatology and was told that she did not have Rheumatoid arthritis and that her elevated RH factor was likely a false positive from her Hep C.  Review of Dr. Charlestine Night notes reveal that she was given a corticosteroid injection in her left shoulder for tendinitis. Notes also reveal that he performed a extensive exam that was suggestive of fibromyalgia and multiple x-rays of hands that revealed joint space narrowing due to sclerosis She was given cyclobenzaprine 10 mg to take as needed for pain. Patient is concerned about what do now since she has pain all over that is affecting her sleep. Pain mostly noticed in bilateral hips, wrist, shoulders, knees for several years. Pain is aching and moderate severity according to patient.    Allergies  Allergen Reactions  . Codeine Nausea Only  . Tramadol Nausea Only   Past Medical History  Diagnosis Date  . Arthritis   . Colitis   . Hepatitis C    Current Outpatient Prescriptions on File Prior to Visit  Medication Sig Dispense Refill  . acetaminophen (TYLENOL) 500 MG tablet Take 500-1,000 mg by mouth daily.    Marland Kitchen ibuprofen (ADVIL,MOTRIN) 600 MG tablet Take 1 tablet (600 mg total) by mouth every 8 (eight) hours as needed. 60 tablet 0  . lisinopril (PRINIVIL,ZESTRIL) 10 MG tablet Take 1 tablet (10 mg total) by mouth daily. 30 tablet 3   No current facility-administered medications on file prior to visit.   Family History  Problem Relation Age of Onset  . Heart disease Mother   . Cancer Mother    History   Social History  . Marital Status: Married    Spouse Name: N/A  . Number of Children: N/A  . Years of Education: N/A   Occupational History  . Not on file.    Social History Main Topics  . Smoking status: Current Every Day Smoker -- 0.50 packs/day    Types: Cigarettes  . Smokeless tobacco: Never Used     Comment: smoking 15-20 cigs daily  . Alcohol Use: No  . Drug Use: No  . Sexual Activity: No   Other Topics Concern  . Not on file   Social History Narrative    Review of Systems: Other than what is stated in HPI, all other systems are negative.    Objective:   Filed Vitals:   03/02/15 1659  BP: 130/72  Pulse: 84  Temp: 98.2 F (36.8 C)  Resp: 18    Physical Exam  Constitutional: She is oriented to person, place, and time.  Cardiovascular: Normal rate, regular rhythm and normal heart sounds.   Pulmonary/Chest: Effort normal and breath sounds normal.  Musculoskeletal: Normal range of motion. She exhibits tenderness (of shoulders, wrists, hips). She exhibits no edema.  No joint swelling   Neurological: She is alert and oriented to person, place, and time.  Skin: No erythema.  Psychiatric:  Tearful, anxious     Lab Results  Component Value Date   WBC 8.7 08/23/2014   HGB 17.2* 08/23/2014   HCT 49.1* 08/23/2014   MCV 88.0 08/23/2014   PLT 171 08/23/2014   Lab Results  Component Value Date   CREATININE 0.68 09/22/2014   BUN  9 09/22/2014   NA 138 09/22/2014   K 3.9 09/22/2014   CL 103 09/22/2014   CO2 30 09/22/2014    No results found for: HGBA1C Lipid Panel  No results found for: CHOL, TRIG, HDL, CHOLHDL, VLDL, LDLCALC     Assessment and plan:   Raven Torres was seen today for referral.  Diagnoses and all orders for this visit:  Fibromyalgia Orders: -     amitriptyline (ELAVIL) 25 MG tablet; Take 1 tablet (25 mg total) by mouth at bedtime. PHQ-9 score 12, GAD-7 score of 12 Clinical Fibromyalgia Diagnostic Criteria revealed a score of 12 and Symptom Severity screening revealed a score of 6. Based off my assessment and Dr. Elmon Else notes I feel comfortable diagnosing patient with Fibromyalgia. I will begin  her on amitriptyline which will help with pain and mild depression.  Depression I feel mild depression is from chronic generalized pain that patient has had for several years. Once pain is addressed I feel depression will improve. This is a new diagnoses but she is stable currently. See above scores. I will bring her back in 2 months to reassess pain and depression.    Return in about 2 months (around 05/03/2015) for fibromyalgia.       Lance Bosch, [facility] [phone] 03/02/2015, 5:13 PM

## 2015-03-07 ENCOUNTER — Telehealth: Payer: Self-pay | Admitting: Clinical

## 2015-03-07 ENCOUNTER — Ambulatory Visit
Admission: RE | Admit: 2015-03-07 | Discharge: 2015-03-07 | Disposition: A | Discharge status: Home / Self Care | Payer: Medicaid Other | Source: Ambulatory Visit | Attending: Internal Medicine | Admitting: Internal Medicine

## 2015-03-07 DIAGNOSIS — Z1239 Encounter for other screening for malignant neoplasm of breast: Secondary | ICD-10-CM

## 2015-03-07 NOTE — Telephone Encounter (Signed)
F/u w pt; pt would like to come in and talk to [facility] at next PCP visit, would like to stop smoking, be more active, and lose weight

## 2015-03-13 ENCOUNTER — Telehealth: Payer: Self-pay | Admitting: Internal Medicine

## 2015-03-13 NOTE — Telephone Encounter (Signed)
Called patient. Patient verified name and date of birth. Patient notified that her mammogram is normal. Patient voiced understanding.

## 2015-03-13 NOTE — Telephone Encounter (Signed)
-----   Message from Nila Nephew, RN sent at 03/13/2015 12:39 PM EDT -----   ----- Message -----    From: Lance Bosch, NP    Sent: 03/08/2015   6:06 PM      To: Nila Nephew, RN  Normal mammogram

## 2015-04-12 ENCOUNTER — Telehealth: Payer: Self-pay | Admitting: Internal Medicine

## 2015-04-12 NOTE — Telephone Encounter (Signed)
Patient called to request a med refill for lisinopril (PRINIVIL,ZESTRIL) 10 MG tablet. Please f/u with pt.

## 2015-04-13 NOTE — Telephone Encounter (Signed)
Patient called for medication refill lisinopril (PRINIVIL,ZESTRIL) 10 MG tablet. Patient uses different pharmacy please call to verify.

## 2015-04-19 ENCOUNTER — Other Ambulatory Visit: Payer: Self-pay

## 2015-04-19 DIAGNOSIS — I10 Essential (primary) hypertension: Secondary | ICD-10-CM

## 2015-04-19 MED ORDER — LISINOPRIL 10 MG PO TABS: 10.0000 mg | ORAL_TABLET | Freq: Every day | ORAL | Status: DC

## 2015-05-15 ENCOUNTER — Ambulatory Visit: Payer: Medicaid Other | Attending: Internal Medicine | Admitting: Internal Medicine

## 2015-05-15 ENCOUNTER — Encounter: Payer: Self-pay | Admitting: Internal Medicine

## 2015-05-15 VITALS — BP 164/90 | HR 78 | Temp 98.0°F | Resp 16 | Ht 66.0 in | Wt 194.0 lb

## 2015-05-15 DIAGNOSIS — Z8249 Family history of ischemic heart disease and other diseases of the circulatory system: Secondary | ICD-10-CM | POA: Diagnosis not present

## 2015-05-15 DIAGNOSIS — Z885 Allergy status to narcotic agent status: Secondary | ICD-10-CM | POA: Diagnosis not present

## 2015-05-15 DIAGNOSIS — L819 Disorder of pigmentation, unspecified: Secondary | ICD-10-CM

## 2015-05-15 DIAGNOSIS — M545 Low back pain, unspecified: Secondary | ICD-10-CM

## 2015-05-15 DIAGNOSIS — B1920 Unspecified viral hepatitis C without hepatic coma: Secondary | ICD-10-CM | POA: Diagnosis not present

## 2015-05-15 DIAGNOSIS — F17210 Nicotine dependence, cigarettes, uncomplicated: Secondary | ICD-10-CM | POA: Diagnosis not present

## 2015-05-15 DIAGNOSIS — M797 Fibromyalgia: Secondary | ICD-10-CM | POA: Diagnosis not present

## 2015-05-15 DIAGNOSIS — Z79899 Other long term (current) drug therapy: Secondary | ICD-10-CM | POA: Diagnosis not present

## 2015-05-15 DIAGNOSIS — Z0000 Encounter for general adult medical examination without abnormal findings: Secondary | ICD-10-CM | POA: Diagnosis not present

## 2015-05-15 DIAGNOSIS — L814 Other melanin hyperpigmentation: Secondary | ICD-10-CM

## 2015-05-15 DIAGNOSIS — Z23 Encounter for immunization: Secondary | ICD-10-CM | POA: Diagnosis not present

## 2015-05-15 MED ORDER — DICLOFENAC EPOLAMINE 1.3 % TD PTCH: 1.0000 | MEDICATED_PATCH | Freq: Two times a day (BID) | TRANSDERMAL | Status: DC | PRN

## 2015-05-15 MED ORDER — GABAPENTIN 300 MG PO CAPS: 300.0000 mg | ORAL_CAPSULE | Freq: Every day | ORAL | Status: DC

## 2015-05-15 NOTE — Progress Notes (Signed)
Patient ID: Raven Torres, female   DOB: 05-18-1956, 59 y.o.   MRN: [id number]  CC: fibromyalgia f/u  HPI: Raven Torres is a 59 y.o. female here today for a follow up visit.  Patient has past medical history of arthritis, Hep C, DDD. Patient reports that since last visit she stopped taking amitriptyline because she began to have left eye twitching. She took the medication for one month and then reports that the second month is when she had eye twitching and felt like it was hard for her to drive. She states that she did notice some improvement in pain while on medication.  She states that she has been seen by Dr. Judie Grieve who is recommending that she have physical therapy for her left shoulder pain. Patient is concerned that physical therapy may cause her more problems with her back and wants further evaluation of her spine before agreeing to go to physical therapy.  Patient has No headache, No chest pain, No abdominal pain - No Nausea, No new weakness tingling or numbness, No Cough - SOB.  Allergies  Allergen Reactions  . Codeine Nausea Only  . Tramadol Nausea Only   Past Medical History  Diagnosis Date  . Arthritis   . Colitis   . Hepatitis C    Current Outpatient Prescriptions on File Prior to Visit  Medication Sig Dispense Refill  . acetaminophen (TYLENOL) 500 MG tablet Take 500-1,000 mg by mouth daily.    . cyclobenzaprine (FLEXERIL) 10 MG tablet Take 10 mg by mouth 3 (three) times daily as needed for muscle spasms.    Marland Kitchen ibuprofen (ADVIL,MOTRIN) 600 MG tablet Take 1 tablet (600 mg total) by mouth every 8 (eight) hours as needed. 60 tablet 0  . lisinopril (PRINIVIL,ZESTRIL) 10 MG tablet Take 1 tablet (10 mg total) by mouth daily. 30 tablet 3  . amitriptyline (ELAVIL) 25 MG tablet Take 1 tablet (25 mg total) by mouth at bedtime. (Patient not taking: Reported on 05/15/2015) 30 tablet 2   No current facility-administered medications on file prior to visit.   Family  History  Problem Relation Age of Onset  . Heart disease Mother   . Cancer Mother    Social History   Social History  . Marital Status: Married    Spouse Name: N/A  . Number of Children: N/A  . Years of Education: N/A   Occupational History  . Not on file.   Social History Main Topics  . Smoking status: Current Every Day Smoker -- 0.50 packs/day    Types: Cigarettes  . Smokeless tobacco: Never Used     Comment: smoking 15-20 cigs daily  . Alcohol Use: No  . Drug Use: No  . Sexual Activity: No   Other Topics Concern  . Not on file   Social History Narrative    Review of Systems: Other than what is stated in HPI, all other systems are negative.   Objective:   Filed Vitals:   05/15/15 1523  BP: 164/90  Pulse: 78  Temp: 98 F (36.7 C)  Resp: 16    Physical Exam  Constitutional: She is oriented to person, place, and time.  Neck: Normal range of motion.  Cardiovascular: Normal rate, regular rhythm and normal heart sounds.   Pulmonary/Chest: Effort normal and breath sounds normal.  Musculoskeletal: Normal range of motion. She exhibits tenderness (right shoulder, lumbar).  Neurological: She is alert and oriented to person, place, and time.  Skin: Skin is warm and dry.  Several age  spots and some that may be cancerous on left and right UE  Psychiatric: She has a normal mood and affect.     Lab Results  Component Value Date   WBC 8.7 08/23/2014   HGB 17.2* 08/23/2014   HCT 49.1* 08/23/2014   MCV 88.0 08/23/2014   PLT 171 08/23/2014   Lab Results  Component Value Date   CREATININE 0.68 09/22/2014   BUN 9 09/22/2014   NA 138 09/22/2014   K 3.9 09/22/2014   CL 103 09/22/2014   CO2 30 09/22/2014    No results found for: HGBA1C Lipid Panel  No results found for: CHOL, TRIG, HDL, CHOLHDL, VLDL, LDLCALC     Assessment and plan:   [name] was seen today for follow-up.  Diagnoses and all orders for this visit:  Fibromyalgia -     gabapentin (NEURONTIN)  300 MG capsule; Take 1 capsule (300 mg total) by mouth at bedtime. Discontinued Elavil and switched patient to gabapentin. Patient was not a candidate for Cymbalta due to the possible liver involvement from her Hep C.   Bilateral low back pain without sciatica -     Begin diclofenac (FLECTOR) 1.3 % PTCH; Place 1 patch onto the skin 2 (two) times daily as needed. Patient has allergies to codeine and Tramadol. Will try patches for now  Age spots -     Ambulatory referral to Dermatology  Health care maintenance -     Flu Vaccine QUAD 36+ mos PF IM (Fluarix & Fluzone Quad PF) -     Ambulatory referral to Ophthalmology  Return in about 2 months (around 07/15/2015) for fibromyalgia.        Lance Bosch, [facility] [phone] 05/15/2015, 3:33 PM

## 2015-05-15 NOTE — Progress Notes (Signed)
Patient here for follow up Patient states she has stopped her elavil due it causing her problems with her eyes Patient also states she follows up with ortho and they are recommending physical therapy for her left Shoulder

## 2015-05-22 ENCOUNTER — Other Ambulatory Visit: Payer: Self-pay | Admitting: Internal Medicine

## 2015-05-22 DIAGNOSIS — M25512 Pain in left shoulder: Secondary | ICD-10-CM

## 2015-05-22 DIAGNOSIS — M545 Low back pain, unspecified: Secondary | ICD-10-CM

## 2015-06-05 ENCOUNTER — Encounter: Payer: Self-pay | Admitting: Physical Therapy

## 2015-06-05 ENCOUNTER — Ambulatory Visit: Payer: Medicaid Other | Attending: Internal Medicine | Admitting: Physical Therapy

## 2015-06-05 DIAGNOSIS — M545 Low back pain, unspecified: Secondary | ICD-10-CM

## 2015-06-05 DIAGNOSIS — M25512 Pain in left shoulder: Secondary | ICD-10-CM | POA: Diagnosis present

## 2015-06-05 NOTE — Therapy (Signed)
[address] [place], [place], [address] Phone: [phone]   Fax:  [phone]  Physical Therapy Evaluation  Patient Details  Name: Raven Torres MRN: [id number] Date of Birth: 1956/03/06 Referring Provider: Feliciana Rossetti  Encounter Date: 06/05/2015      PT End of Session - 06/05/15 1044    Visit Number 1   Number of Visits 1   Authorization Type Medicaid   PT Start Time 0939   PT Stop Time 1033   PT Time Calculation (min) 54 min   Activity Tolerance Patient tolerated treatment well   Behavior During Therapy [facility] for tasks assessed/performed      Past Medical History  Diagnosis Date  . Arthritis   . Colitis   . Hepatitis C     Past Surgical History  Procedure Laterality Date  . Knee surgery Right   . Abdominal hysterectomy      There were no vitals filed for this visit.  Visit Diagnosis:  Bilateral low back pain without sciatica - Plan: PT plan of care cert/re-cert  Left shoulder pain - Plan: PT plan of care cert/re-cert      Subjective Assessment - 06/05/15 0942    Subjective Patient reports that she has been having low back pain and left shoulder pain for "a long time".  She is unsure of a specific cause.  Reports a tear in the left RC and has had cortisone injections.  Reports that she feels that her quality of life has suffered because she cannot do much due to pain.   Limitations House hold activities;Sitting;Lifting;Standing;Walking   Patient Stated Goals less pain   Currently in Pain? Yes   Pain Score 5    Pain Location Back  also left shoulder   Pain Orientation Lower   Pain Descriptors / Indicators Aching;Dull   Pain Type Chronic pain   Pain Onset More than a month ago   Pain Frequency Constant   Aggravating Factors  reports that standing, bending and any activity will increase pain to a 9/10   Pain Relieving Factors lie down pain to a 3/10   Effect of Pain on Daily Activities  reports affects her ability to do any ADL's            [facility] PT Assessment - 06/05/15 0001    Assessment   Medical Diagnosis low back pain and left shoulder pain   Referring Provider [facility]   Onset Date/Surgical Date 05/06/15   Hand Dominance Left   Prior Therapy none   Precautions   Precautions None   Balance Screen   Has the patient fallen in the past 6 months No   Has the patient had a decrease in activity level because of a fear of falling?  No   Is the patient reluctant to leave their home because of a fear of falling?  No   Home Environment   Additional Comments housework, grandchildren come over and she has not been able to lift   Prior Function   Level of Independence Independent   Vocation Unemployed;On disability   Posture/Postural Control   Posture Comments fwd head and rounded shoulders   AROM   Overall AROM Comments Lumbar ROM was decreased 50% for all motions with pain, Left shoulder AROM flexion and abduction to 90- degrees, ER 50 degrees and IR 40 degrees all motions of lumbar and shoulder increase the pain   Palpation   Palpation comment tender and tight in  the paraspinals from the lumbar to the thoracic parapsinals                   [facility] Adult PT Treatment/Exercise - 06/05/15 0001    Modalities   Modalities Electrical Stimulation;Moist Heat   Moist Heat Therapy   Number Minutes Moist Heat 15 Minutes   Moist Heat Location Lumbar Spine   Electrical Stimulation   Electrical Stimulation Location lumbar   Electrical Stimulation Action IFC   Electrical Stimulation Parameters tolerance   Electrical Stimulation Goals Pain                PT Education - 06/05/15 1043    Education provided Yes   Education Details Wms flexion exercises and information about the TENS   Person(s) Educated Patient   Methods Explanation;Demonstration;Handout   Comprehension Verbalized understanding             PT Long Term Goals - 06/05/15 1056    PT  LONG TERM GOAL #1   Title independent with HEP   Time 1   Period Days   Status Achieved               Plan - 06/05/15 1045    Clinical Impression Statement Patient with long standing back pain and shoulder pain.  She is unsure of a specific cause.  She is medicaid so she only gets one visit per year.  Seems to have tightness in the LE's and some degenerative changes int he low back that responded well to Wms flexion exercises.   Pt will benefit from skilled therapeutic intervention in order to improve on the following deficits Pain;Improper body mechanics;Impaired flexibility   PT Frequency One time visit   PT Treatment/Interventions Moist Heat;Electrical Stimulation;Therapeutic exercise;Patient/family education   PT Next Visit Plan Gave her HEP and information about the TENS, Medicaid allows one visit   Consulted and Agree with Plan of Care Patient         Problem List Patient Active Problem List   Diagnosis Date Noted  . Fibromyalgia 05/15/2015  . Elevated BP 08/23/2014  . Rheumatoid arthritis ([place]) 08/22/2014  . HTN (hypertension) 08/22/2014  . Smoking 08/22/2014  . Hepatic cirrhosis ([place]) 05/31/2014  . Chronic hepatitis C virus infection ([place]) 05/03/2014    Sumner Boast., PT 06/05/2015, 10:58 AM  [address] [place], [place], [address] Phone: [phone]   Fax:  [phone]  Name: Raven Torres MRN: [id number] Date of Birth: [birth year]

## 2015-06-12 ENCOUNTER — Telehealth: Payer: Self-pay | Admitting: Internal Medicine

## 2015-06-12 DIAGNOSIS — M797 Fibromyalgia: Secondary | ICD-10-CM

## 2015-06-12 NOTE — Telephone Encounter (Signed)
Patient called needing a prior authorization for diclofenac [facility]) Please follow up with patient Patient uses this pharmacy  [id number] - [place], [address] AT [address]

## 2015-06-14 NOTE — Telephone Encounter (Signed)
[name] please take care of this

## 2015-06-16 NOTE — Telephone Encounter (Signed)
Patient called to check on the status of the prior auth for her medications. Please f/u with pt.

## 2015-06-19 MED ORDER — DICLOFENAC SODIUM 1 % TD GEL: 2.0000 g | Freq: Four times a day (QID) | TRANSDERMAL | Status: DC

## 2015-06-19 NOTE — Telephone Encounter (Signed)
Nurse called patient, patient verified date of birth. Nurse explained to patient, flector patch is non-preferred medication. Voltaren gel is preferred. Patient agrees for provider to change patients medication to Voltaren gel, is provider agrees.  Nurse wills ned message to provider. Nurse spoke to [employer]. Per Mateo Flow, change to voltaren gel, 2gm, QID.  Patient aware of change and prescription sent to pharmacy.

## 2015-07-03 ENCOUNTER — Telehealth: Payer: Self-pay | Admitting: *Deleted

## 2015-07-03 NOTE — Telephone Encounter (Signed)
It is for cirrhosis, not hep C, HCC screen every 6 months.  Maybe a limited ultrasound?  See if we can retry, I suspect it was just misdiagnosed.  Otherwise I will appeal.  Thanks

## 2015-07-03 NOTE — Telephone Encounter (Signed)
[name] is resubmitting it. Raven Torres

## 2015-07-03 NOTE — Telephone Encounter (Signed)
Abdominal ultrasound denied by Medicaid stating that a previous same study had been performed. May need for MD to appeal if still needed. Raven Torres

## 2015-07-10 ENCOUNTER — Other Ambulatory Visit: Payer: Medicaid Other

## 2015-07-10 ENCOUNTER — Ambulatory Visit (HOSPITAL_COMMUNITY): Payer: Medicaid Other

## 2015-07-10 DIAGNOSIS — B182 Chronic viral hepatitis C: Secondary | ICD-10-CM

## 2015-07-10 DIAGNOSIS — K7460 Unspecified cirrhosis of liver: Secondary | ICD-10-CM

## 2015-07-12 LAB — HEPATITIS C RNA QUANTITATIVE: HCV Quantitative: NOT DETECTED IU/mL (ref <15)

## 2015-07-19 ENCOUNTER — Encounter: Payer: Self-pay | Admitting: Internal Medicine

## 2015-07-19 ENCOUNTER — Ambulatory Visit: Payer: Medicaid Other | Attending: Internal Medicine | Admitting: Internal Medicine

## 2015-07-19 VITALS — BP 120/68 | HR 80 | Temp 97.8°F | Resp 18 | Ht 67.0 in | Wt 195.2 lb

## 2015-07-19 DIAGNOSIS — Z79899 Other long term (current) drug therapy: Secondary | ICD-10-CM | POA: Insufficient documentation

## 2015-07-19 DIAGNOSIS — L304 Erythema intertrigo: Secondary | ICD-10-CM

## 2015-07-19 DIAGNOSIS — Z885 Allergy status to narcotic agent status: Secondary | ICD-10-CM | POA: Insufficient documentation

## 2015-07-19 DIAGNOSIS — M797 Fibromyalgia: Secondary | ICD-10-CM

## 2015-07-19 DIAGNOSIS — I10 Essential (primary) hypertension: Secondary | ICD-10-CM

## 2015-07-19 DIAGNOSIS — B1920 Unspecified viral hepatitis C without hepatic coma: Secondary | ICD-10-CM | POA: Diagnosis not present

## 2015-07-19 DIAGNOSIS — F17210 Nicotine dependence, cigarettes, uncomplicated: Secondary | ICD-10-CM | POA: Diagnosis not present

## 2015-07-19 LAB — BASIC METABOLIC PANEL
BUN: 8 mg/dL (ref 7–25)
CALCIUM: 9.5 mg/dL (ref 8.6–10.4)
CO2: 27 mmol/L (ref 20–31)
Chloride: 103 mmol/L (ref 98–110)
Creat: 0.7 mg/dL (ref 0.50–0.99)
GLUCOSE: 88 mg/dL (ref 65–99)
Potassium: 4.7 mmol/L (ref 3.5–5.3)
SODIUM: 138 mmol/L (ref 135–146)

## 2015-07-19 LAB — LIPID PANEL
CHOL/HDL RATIO: 4.6 ratio (ref <=5.0)
CHOLESTEROL: 228 mg/dL — AB (ref 125–200)
HDL: 50 mg/dL (ref >=46)
LDL Cholesterol: 140 mg/dL — ABNORMAL HIGH (ref <130)
Triglycerides: 192 mg/dL — ABNORMAL HIGH (ref <150)
VLDL: 38 mg/dL — ABNORMAL HIGH (ref <30)

## 2015-07-19 MED ORDER — LISINOPRIL 10 MG PO TABS: 10.0000 mg | ORAL_TABLET | Freq: Every day | ORAL | Status: DC

## 2015-07-19 MED ORDER — CYCLOBENZAPRINE HCL 10 MG PO TABS: 10.0000 mg | ORAL_TABLET | Freq: Three times a day (TID) | ORAL | Status: DC | PRN

## 2015-07-19 MED ORDER — GABAPENTIN 300 MG PO CAPS: 300.0000 mg | ORAL_CAPSULE | Freq: Two times a day (BID) | ORAL | Status: DC

## 2015-07-19 MED ORDER — NYSTATIN 100000 UNIT/GM EX CREA: 1.0000 "application " | TOPICAL_CREAM | Freq: Two times a day (BID) | CUTANEOUS | Status: DC

## 2015-07-19 NOTE — Progress Notes (Signed)
Patient ID: Raven Torres, female   DOB: [birth year], 59 y.o.   MRN: [id number]  CC: fibromyalgia follow up  HPI: Raven Torres is a 59 y.o. female here today for a follow up visit.  Patient has past medical history of Hep C and Fibromyalgia. Patient has been evaluated here several times for complaints of fibromyalgia. She was last switched off Amitriptyline due to side effects and switched to gabapentin 300 mg at bedtime. She states that since she has begun the Gabapentin she has not noticed much pain relief. She states that most of her pain relief is from the Voltaren gel.  Patient is concerned about a red, itchy rash under bilateral breast that has been present for one week. She has tried a OTC cream with some relief. Patient would like refills of her blood pressure medication.  Allergies  Allergen Reactions  . Codeine Nausea Only  . Tramadol Nausea Only   Past Medical History  Diagnosis Date  . Arthritis   . Colitis   . Hepatitis C    Current Outpatient Prescriptions on File Prior to Visit  Medication Sig Dispense Refill  . acetaminophen (TYLENOL) 500 MG tablet Take 500-1,000 mg by mouth daily.    . cyclobenzaprine (FLEXERIL) 10 MG tablet Take 10 mg by mouth 3 (three) times daily as needed for muscle spasms.     . diclofenac sodium (VOLTAREN) 1 % GEL Apply 2 g topically 4 (four) times daily. 1 Tube 2  . gabapentin (NEURONTIN) 300 MG capsule Take 1 capsule (300 mg total) by mouth at bedtime. 30 capsule 2  . ibuprofen (ADVIL,MOTRIN) 600 MG tablet Take 1 tablet (600 mg total) by mouth every 8 (eight) hours as needed. 60 tablet 0  . lisinopril (PRINIVIL,ZESTRIL) 10 MG tablet Take 1 tablet (10 mg total) by mouth daily. 30 tablet 3   No current facility-administered medications on file prior to visit.   Family History  Problem Relation Age of Onset  . Heart disease Mother   . Cancer Mother    Social History   Social History  . Marital Status: Married    Spouse Name: N/A  .  Number of Children: N/A  . Years of Education: N/A   Occupational History  . Not on file.   Social History Main Topics  . Smoking status: Current Every Day Smoker -- 0.50 packs/day    Types: Cigarettes  . Smokeless tobacco: Never Used     Comment: smoking 15-20 cigs daily  . Alcohol Use: No  . Drug Use: No  . Sexual Activity: No   Other Topics Concern  . Not on file   Social History Narrative    Review of Systems: Other than what is stated in HPI, all other systems are negative.   Objective:   Filed Vitals:   07/19/15 1124  BP: 148/74  Pulse: 80  Temp: 97.8 F (36.6 C)  Resp: 18    Physical Exam  Constitutional: She is oriented to person, place, and time.  Cardiovascular: Normal rate, regular rhythm and normal heart sounds.   Pulmonary/Chest: Effort normal and breath sounds normal.  Musculoskeletal: She exhibits no tenderness.  Neurological: She is alert and oriented to person, place, and time.  Skin: Skin is warm. Rash (intertrigo under breast) noted. There is erythema.     Lab Results  Component Value Date   WBC 8.7 08/23/2014   HGB 17.2* 08/23/2014   HCT 49.1* 08/23/2014   MCV 88.0 08/23/2014   PLT 171 08/23/2014  Lab Results  Component Value Date   CREATININE 0.68 09/22/2014   BUN 9 09/22/2014   NA 138 09/22/2014   K 3.9 09/22/2014   CL 103 09/22/2014   CO2 30 09/22/2014    No results found for: HGBA1C Lipid Panel  No results found for: CHOL, TRIG, HDL, CHOLHDL, VLDL, LDLCALC     Assessment and plan:   [name] was seen today for follow-up.  Diagnoses and all orders for this visit:  Fibromyalgia -     gabapentin (NEURONTIN) 300 MG capsule; Take 1 capsule (300 mg total) by mouth 2 (two) times daily. -     cyclobenzaprine (FLEXERIL) 10 MG tablet; Take 1 tablet (10 mg total) by mouth 3 (three) times daily as needed for muscle spasms. I have increased her Gabapentin to 300 mg BID for better pain relief.   Essential hypertension -      Refill lisinopril (PRINIVIL,ZESTRIL) 10 MG tablet; Take 1 tablet (10 mg total) by mouth daily. -     Basic Metabolic Panel -     Lipid panel Patient blood pressure is stable and may continue on current medication.  Education on diet, exercise, and modifiable risk factors discussed. Will obtain appropriate labs as needed. Will follow up in 3-6 months.   Intertrigo -     nystatin cream (MYCOSTATIN); Apply 1 application topically 2 (two) times daily.   Return in about 3 months (around 10/17/2015) for Hypertension.       Lance Bosch, [facility] [phone] 07/19/2015, 11:34 AM

## 2015-07-19 NOTE — Patient Instructions (Signed)
Increase gabapentin to 300 mg two times per day. We will follow up in 3 months

## 2015-07-19 NOTE — Progress Notes (Signed)
Patient here for 2 month Fibromyalgia FU  Patient complains of chronic pain scaled at a 6 right now.  Patient requesting refill on Ibuprofen.

## 2015-07-27 ENCOUNTER — Telehealth: Payer: Self-pay

## 2015-07-27 ENCOUNTER — Encounter: Payer: Self-pay | Admitting: Internal Medicine

## 2015-07-27 ENCOUNTER — Ambulatory Visit (INDEPENDENT_AMBULATORY_CARE_PROVIDER_SITE_OTHER): Payer: Medicaid Other | Admitting: Internal Medicine

## 2015-07-27 VITALS — BP 143/79 | HR 78 | Temp 98.0°F | Ht 66.0 in | Wt 193.0 lb

## 2015-07-27 DIAGNOSIS — E7800 Pure hypercholesterolemia, unspecified: Secondary | ICD-10-CM

## 2015-07-27 DIAGNOSIS — B182 Chronic viral hepatitis C: Secondary | ICD-10-CM | POA: Diagnosis not present

## 2015-07-27 DIAGNOSIS — K7460 Unspecified cirrhosis of liver: Secondary | ICD-10-CM | POA: Diagnosis not present

## 2015-07-27 MED ORDER — ATORVASTATIN CALCIUM 20 MG PO TABS: 20.0000 mg | ORAL_TABLET | Freq: Every day | ORAL | Status: DC

## 2015-07-27 NOTE — Telephone Encounter (Signed)
Pt. Returned call. Please f/u with pt. °

## 2015-07-27 NOTE — Progress Notes (Signed)
   Subjective:    Patient ID: Raven Torres, female    DOB: 1955-12-06, 59 y.o.   MRN: [id number]  HPI She is here for follow-up of hepatitis C and cirrhosis.    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 Her elastography was consistent with F3 to F4. She has completed 12 weeks of Harvoni. Initial viral load undetectable. SVR12 and now SVR24 done and are undetectable.  I have requested [place] screen of ultrasound but this was denied by her insurance.      Review of Systems  Constitutional: Negative for fatigue and unexpected weight change.  Gastrointestinal: Negative for nausea.  Skin: Negative for rash.  Neurological: Negative for dizziness and light-headedness.       Objective:   Physical Exam  Constitutional: She appears well-developed and well-nourished. No distress.  Eyes: No scleral icterus.  Cardiovascular: Normal rate, regular rhythm and normal heart sounds.   No murmur heard. Pulmonary/Chest: Effort normal and breath sounds normal. No respiratory distress.  Skin: No rash noted.          Assessment & Plan:

## 2015-07-27 NOTE — Telephone Encounter (Signed)
-----   Message from Lance Bosch, NP sent at 07/26/2015  5:49 PM EST ----- Cholesterol is really elevated. Please go over things that increase cholesterol levels such as breads pasta, rice, butters, fried foods, etc. Please send her Lipitor 20 mg to take every evening with dinner. Please explain that high cholesterol places her at risk for stroke and heart disease

## 2015-07-27 NOTE — Assessment & Plan Note (Signed)
Now considered cured.  

## 2015-07-27 NOTE — Telephone Encounter (Signed)
Patient called back, please f/u  °

## 2015-07-27 NOTE — Assessment & Plan Note (Signed)
Will reorder [facility] screen and every 6 months.

## 2015-07-27 NOTE — Telephone Encounter (Signed)
Tried to call patient  Patient not available Message left on voice mail to return our call

## 2015-07-28 ENCOUNTER — Telehealth: Payer: Self-pay | Admitting: Internal Medicine

## 2015-07-28 ENCOUNTER — Telehealth: Payer: Self-pay

## 2015-07-28 DIAGNOSIS — E7800 Pure hypercholesterolemia, unspecified: Secondary | ICD-10-CM

## 2015-07-28 MED ORDER — ATORVASTATIN CALCIUM 20 MG PO TABS: 20.0000 mg | ORAL_TABLET | Freq: Every day | ORAL | Status: DC

## 2015-07-28 NOTE — Telephone Encounter (Signed)
Returned patient phone call and patient is aware of her lab results Per patient request RX sent to [facility] on file

## 2015-07-28 NOTE — Telephone Encounter (Signed)
Pt. Returned call. Please f/u with pt. °

## 2015-08-14 ENCOUNTER — Ambulatory Visit (HOSPITAL_COMMUNITY): Payer: Medicaid Other

## 2015-08-21 ENCOUNTER — Ambulatory Visit (HOSPITAL_COMMUNITY)
Admission: RE | Admit: 2015-08-21 | Discharge: 2015-08-21 | Disposition: A | Discharge status: Home / Self Care | Payer: Medicaid Other | Source: Ambulatory Visit | Attending: Internal Medicine | Admitting: Internal Medicine

## 2015-08-21 ENCOUNTER — Telehealth: Payer: Self-pay | Admitting: *Deleted

## 2015-08-21 DIAGNOSIS — K7460 Unspecified cirrhosis of liver: Secondary | ICD-10-CM | POA: Diagnosis not present

## 2015-08-21 DIAGNOSIS — B1920 Unspecified viral hepatitis C without hepatic coma: Secondary | ICD-10-CM | POA: Insufficient documentation

## 2015-08-21 NOTE — Telephone Encounter (Signed)
-----   Message from Thayer Headings, MD sent at 08/21/2015  8:55 AM EST ----- Please let her know her ultrasound looks fine for [facility] screen.  Will repeat at next appt. thanks

## 2015-08-21 NOTE — Telephone Encounter (Signed)
Patient notified

## 2015-10-12 ENCOUNTER — Ambulatory Visit: Payer: Medicaid Other | Attending: Internal Medicine | Admitting: Internal Medicine

## 2015-10-12 ENCOUNTER — Encounter: Payer: Self-pay | Admitting: Internal Medicine

## 2015-10-12 VITALS — BP 148/78 | HR 88 | Temp 98.0°F | Resp 16 | Ht 67.0 in | Wt 191.0 lb

## 2015-10-12 DIAGNOSIS — Z885 Allergy status to narcotic agent status: Secondary | ICD-10-CM | POA: Diagnosis not present

## 2015-10-12 DIAGNOSIS — E7800 Pure hypercholesterolemia, unspecified: Secondary | ICD-10-CM | POA: Diagnosis not present

## 2015-10-12 DIAGNOSIS — F17210 Nicotine dependence, cigarettes, uncomplicated: Secondary | ICD-10-CM | POA: Diagnosis not present

## 2015-10-12 DIAGNOSIS — Z79899 Other long term (current) drug therapy: Secondary | ICD-10-CM | POA: Insufficient documentation

## 2015-10-12 DIAGNOSIS — R0602 Shortness of breath: Secondary | ICD-10-CM

## 2015-10-12 DIAGNOSIS — I10 Essential (primary) hypertension: Secondary | ICD-10-CM | POA: Diagnosis not present

## 2015-10-12 DIAGNOSIS — Z720 Tobacco use: Secondary | ICD-10-CM

## 2015-10-12 DIAGNOSIS — Z8619 Personal history of other infectious and parasitic diseases: Secondary | ICD-10-CM | POA: Insufficient documentation

## 2015-10-12 MED ORDER — ATORVASTATIN CALCIUM 20 MG PO TABS: 20.0000 mg | ORAL_TABLET | Freq: Every day | ORAL | Status: DC

## 2015-10-12 MED ORDER — DICLOFENAC SODIUM 1 % TD GEL: 2.0000 g | Freq: Four times a day (QID) | TRANSDERMAL | Status: DC

## 2015-10-12 MED ORDER — LISINOPRIL 10 MG PO TABS: 10.0000 mg | ORAL_TABLET | Freq: Every day | ORAL | Status: DC

## 2015-10-12 MED ORDER — NICOTINE 21 MG/24HR TD PT24: 21.0000 mg | MEDICATED_PATCH | Freq: Every day | TRANSDERMAL | Status: DC

## 2015-10-12 MED ORDER — ALBUTEROL SULFATE HFA 108 (90 BASE) MCG/ACT IN AERS: 2.0000 | INHALATION_SPRAY | Freq: Four times a day (QID) | RESPIRATORY_TRACT | Status: DC | PRN

## 2015-10-12 NOTE — Progress Notes (Signed)
Patient ID: Raven Torres, female   DOB: 10-29-1955, 60 y.o.   MRN: [id number]  CC: Follow up  HPI: Raven Torres is a 60 y.o. female here today for a follow up visit.  Patient has past medical history of hep C, RA, HTN, fibromyalgia and smoking.  Patient reports SOB on exertion and raised area on left breast.  Patient reports having SOB with trying to complete activities.  Patient requesting smoking cessation patches. Patient feels if she stops smoking she will feel better.  Patient states she has been applying her sister's cancer cream to raised are on left breast and applying a bandaid.  Patient state the area painful to touch, but denies drainage or fever.  Patient has No headache, No chest pain, No abdominal pain - No Nausea, No new weakness tingling or numbness.  Allergies  Allergen Reactions  . Codeine Nausea Only  . Tramadol Nausea Only   Past Medical History  Diagnosis Date  . Arthritis   . Colitis   . Hepatitis C    Current Outpatient Prescriptions on File Prior to Visit  Medication Sig Dispense Refill  . cyclobenzaprine (FLEXERIL) 10 MG tablet Take 1 tablet (10 mg total) by mouth 3 (three) times daily as needed for muscle spasms. 30 tablet 3  . gabapentin (NEURONTIN) 300 MG capsule Take 1 capsule (300 mg total) by mouth 2 (two) times daily. 60 capsule 3  . acetaminophen (TYLENOL) 500 MG tablet Take 500-1,000 mg by mouth daily.    Marland Kitchen ibuprofen (ADVIL,MOTRIN) 600 MG tablet Take 1 tablet (600 mg total) by mouth every 8 (eight) hours as needed. 60 tablet 0  . nystatin cream (MYCOSTATIN) Apply 1 application topically 2 (two) times daily. 30 g 0   No current facility-administered medications on file prior to visit.   Family History  Problem Relation Age of Onset  . Heart disease Mother   . Cancer Mother    Social History   Social History  . Marital Status: Married    Spouse Name: N/A  . Number of Children: N/A  . Years of Education: N/A   Occupational History  . Not on  file.   Social History Main Topics  . Smoking status: Current Every Day Smoker -- 0.50 packs/day    Types: Cigarettes  . Smokeless tobacco: Never Used     Comment: smoking 15-20 cigs daily  . Alcohol Use: No  . Drug Use: No  . Sexual Activity: No   Other Topics Concern  . Not on file   Social History Narrative    Review of Systems: Constitutional: Negative for fever, chills, diaphoresis, activity change, appetite change and fatigue. HENT: Negative for ear pain, nosebleeds, congestion, facial swelling, rhinorrhea, neck pain, neck stiffness and ear discharge.  Eyes: Negative for pain, discharge, redness, itching and visual disturbance. Respiratory: Negative for cough, choking, chest tightness, shortness of breath, wheezing and stridor.  Cardiovascular: Negative for chest pain, palpitations and leg swelling. Gastrointestinal: Negative for abdominal distention. Genitourinary: Negative for dysuria, urgency, frequency, hematuria, flank pain, decreased urine volume, difficulty urinating and dyspareunia.  Musculoskeletal: Negative for back pain, joint swelling, arthralgias and gait problem. Neurological: Negative for dizziness, tremors, seizures, syncope, facial asymmetry, speech difficulty, weakness, light-headedness, numbness and headaches.  Hematological: Negative for adenopathy. Does not bruise/bleed easily. Psychiatric/Behavioral: Negative for hallucinations, behavioral problems, confusion, dysphoric mood, decreased concentration and agitation.    Objective:   Filed Vitals:   10/12/15 1601  BP: 148/78  Pulse: 88  Temp: 98 F (36.7  C)  Resp: 16    Physical Exam: Constitutional: Patient appears well-developed and well-nourished. No distress. HENT: Normocephalic, atraumatic, External right and left ear normal.   Eyes: Conjunctivae are normal. no scleral icterus. Neck: Normal ROM. Neck supple. No JVD. No tracheal deviation.  CVS: RRR, S1/S2 +, no murmurs, no gallops, no  carotid bruit.  Pulmonary: Expiratory wheezing.  Effort and breath sounds normal, no stridor, rhonchi, rales.  Abdominal: Soft. BS +,  no distension, tenderness, rebound or guarding.  Musculoskeletal: Normal range of motion. No edema and no tenderness.  Skin: seborrheic keratosis to left breast Skin is warm and dry. No rash noted. Not diaphoretic. No erythema. No pallor. Psychiatric: Normal mood and affect. Behavior, judgment, thought content normal.  Lab Results  Component Value Date   WBC 8.7 08/23/2014   HGB 17.2* 08/23/2014   HCT 49.1* 08/23/2014   MCV 88.0 08/23/2014   PLT 171 08/23/2014   Lab Results  Component Value Date   CREATININE 0.70 07/19/2015   BUN 8 07/19/2015   NA 138 07/19/2015   K 4.7 07/19/2015   CL 103 07/19/2015   CO2 27 07/19/2015    No results found for: HGBA1C Lipid Panel     Component Value Date/Time   CHOL 228* 07/19/2015 1156   TRIG 192* 07/19/2015 1156   HDL 50 07/19/2015 1156   CHOLHDL 4.6 07/19/2015 1156   VLDL 38* 07/19/2015 1156   LDLCALC 140* 07/19/2015 1156       Assessment and plan:   Tenay was seen today for follow-up.  Diagnoses and all orders for this visit:  Tobacco abuse -     nicotine (NICODERM CQ - DOSED IN MG/24 HOURS) 21 mg/24hr patch; Place 1 patch (21 mg total) onto the skin daily. Patient encouraged to quit smoking and apply patches are ordered.    High cholesterol -     atorvastatin (LIPITOR) 20 MG tablet; Take 1 tablet (20 mg total) by mouth daily. Patient to continue Lipitor as ordered.  Labs to be completed at next visit.    Essential hypertension -     lisinopril (PRINIVIL,ZESTRIL) 10 MG tablet; Take 1 tablet (10 mg total) by mouth daily. Patient's BP is controlled with current dose.  Patient instructed to continue medication as ordered.   SOB (shortness of breath) -     albuterol (PROVENTIL HFA;VENTOLIN HFA) 108 (90 Base) MCG/ACT inhaler; Inhale 2 puffs into the lungs every 6 (six) hours as needed for  wheezing or shortness of breath. Patient to start albuterol inhaler for SOB or wheezing.  Patient to follow up if symptoms worsen.  Other orders -     diclofenac sodium (VOLTAREN) 1 % GEL; Apply 2 g topically 4 (four) times daily.         Melissa Noon, BSN, RN, student NP [facility] [phone] 10/12/2015, 4:31 PM

## 2015-10-12 NOTE — Progress Notes (Signed)
Patient here for follow up on her HTN and cholesterol 

## 2015-10-25 ENCOUNTER — Other Ambulatory Visit (HOSPITAL_COMMUNITY)
Admission: RE | Admit: 2015-10-25 | Discharge: 2015-10-25 | Disposition: A | Discharge status: Home / Self Care | Payer: Medicaid Other | Source: Ambulatory Visit | Attending: Internal Medicine | Admitting: Internal Medicine

## 2015-10-25 ENCOUNTER — Ambulatory Visit: Payer: Medicaid Other | Attending: Internal Medicine | Admitting: Internal Medicine

## 2015-10-25 ENCOUNTER — Encounter: Payer: Self-pay | Admitting: Internal Medicine

## 2015-10-25 VITALS — BP 146/77 | HR 96 | Temp 98.0°F | Resp 16 | Ht 67.0 in | Wt 191.0 lb

## 2015-10-25 DIAGNOSIS — Z124 Encounter for screening for malignant neoplasm of cervix: Secondary | ICD-10-CM

## 2015-10-25 DIAGNOSIS — Z79899 Other long term (current) drug therapy: Secondary | ICD-10-CM | POA: Diagnosis not present

## 2015-10-25 DIAGNOSIS — Z1272 Encounter for screening for malignant neoplasm of vagina: Secondary | ICD-10-CM | POA: Insufficient documentation

## 2015-10-25 DIAGNOSIS — B1920 Unspecified viral hepatitis C without hepatic coma: Secondary | ICD-10-CM | POA: Diagnosis not present

## 2015-10-25 DIAGNOSIS — Z888 Allergy status to other drugs, medicaments and biological substances status: Secondary | ICD-10-CM | POA: Insufficient documentation

## 2015-10-25 DIAGNOSIS — Z113 Encounter for screening for infections with a predominantly sexual mode of transmission: Secondary | ICD-10-CM | POA: Insufficient documentation

## 2015-10-25 DIAGNOSIS — M1990 Unspecified osteoarthritis, unspecified site: Secondary | ICD-10-CM | POA: Diagnosis not present

## 2015-10-25 DIAGNOSIS — F17210 Nicotine dependence, cigarettes, uncomplicated: Secondary | ICD-10-CM | POA: Diagnosis not present

## 2015-10-25 DIAGNOSIS — Z90710 Acquired absence of both cervix and uterus: Secondary | ICD-10-CM | POA: Insufficient documentation

## 2015-10-25 DIAGNOSIS — N760 Acute vaginitis: Secondary | ICD-10-CM | POA: Insufficient documentation

## 2015-10-25 NOTE — Progress Notes (Signed)
Patient ID: Raven Torres, female   DOB: [birth year], 60 y.o.   MRN: [id number]  CC: pap smear  HPI: Raven Torres is a 60 y.o. female here today for a follow up visit.  Patient has past medical history of Hep C, arthritis, and colitis. Patient reports that she had a Hysterectomy over 40 years ago for severe Endometriosis but does not remember if her cervix was taken during that time. She is here for evaluation and possible pap if needed.  Patient had a recent mammogram 3 months ago that was normal. She has no other complaints at this time.   Allergies  Allergen Reactions  . Codeine Nausea Only  . Tramadol Nausea Only   Past Medical History  Diagnosis Date  . Arthritis   . Colitis   . Hepatitis C    Current Outpatient Prescriptions on File Prior to Visit  Medication Sig Dispense Refill  . acetaminophen (TYLENOL) 500 MG tablet Take 500-1,000 mg by mouth daily.    Marland Kitchen albuterol (PROVENTIL HFA;VENTOLIN HFA) 108 (90 Base) MCG/ACT inhaler Inhale 2 puffs into the lungs every 6 (six) hours as needed for wheezing or shortness of breath. 1 Inhaler 0  . atorvastatin (LIPITOR) 20 MG tablet Take 1 tablet (20 mg total) by mouth daily. 90 tablet 3  . cyclobenzaprine (FLEXERIL) 10 MG tablet Take 1 tablet (10 mg total) by mouth 3 (three) times daily as needed for muscle spasms. 30 tablet 3  . diclofenac sodium (VOLTAREN) 1 % GEL Apply 2 g topically 4 (four) times daily. 1 Tube 2  . gabapentin (NEURONTIN) 300 MG capsule Take 1 capsule (300 mg total) by mouth 2 (two) times daily. 60 capsule 3  . ibuprofen (ADVIL,MOTRIN) 600 MG tablet Take 1 tablet (600 mg total) by mouth every 8 (eight) hours as needed. 60 tablet 0  . lisinopril (PRINIVIL,ZESTRIL) 10 MG tablet Take 1 tablet (10 mg total) by mouth daily. 30 tablet 3  . nicotine (NICODERM CQ - DOSED IN MG/24 HOURS) 21 mg/24hr patch Place 1 patch (21 mg total) onto the skin daily. 28 patch 1  . nystatin cream (MYCOSTATIN) Apply 1 application topically 2 (two)  times daily. 30 g 0   No current facility-administered medications on file prior to visit.   Family History  Problem Relation Age of Onset  . Heart disease Mother   . Cancer Mother    Social History   Social History  . Marital Status: Married    Spouse Name: N/A  . Number of Children: N/A  . Years of Education: N/A   Occupational History  . Not on file.   Social History Main Topics  . Smoking status: Current Every Day Smoker -- 0.50 packs/day    Types: Cigarettes  . Smokeless tobacco: Never Used     Comment: smoking 15-20 cigs daily  . Alcohol Use: No  . Drug Use: No  . Sexual Activity: No   Other Topics Concern  . Not on file   Social History Narrative    Review of Systems: Constitutional: Negative for fever, chills, diaphoresis, activity change, appetite change and fatigue. HENT: Negative for ear pain, nosebleeds, congestion, facial swelling, rhinorrhea, neck pain, neck stiffness and ear discharge.  Eyes: Negative for pain, discharge, redness, itching and visual disturbance. Respiratory: Negative for cough, choking, chest tightness, shortness of breath, wheezing and stridor.  Cardiovascular: Negative for chest pain, palpitations and leg swelling. Gastrointestinal: Negative for abdominal distention. Genitourinary: Negative for dysuria, urgency, frequency, hematuria, flank pain, decreased urine  volume, difficulty urinating and dyspareunia.  Musculoskeletal: Negative for back pain, joint swelling, arthralgias and gait problem. Neurological: Negative for dizziness, tremors, seizures, syncope, facial asymmetry, speech difficulty, weakness, light-headedness, numbness and headaches.  Hematological: Negative for adenopathy. Does not bruise/bleed easily. Psychiatric/Behavioral: Negative for hallucinations, behavioral problems, confusion, dysphoric mood, decreased concentration and agitation.    Objective:   Filed Vitals:   10/25/15 1132  BP: 146/77  Pulse: 96  Temp: 98  F (36.7 C)  Resp: 16    Physical Exam  Abdominal: Soft. Bowel sounds are normal. There is no tenderness.  Genitourinary: Vagina normal.  No uterus or cervix  Lymphadenopathy:       Right: No inguinal adenopathy present.       Left: No inguinal adenopathy present.     Lab Results  Component Value Date   WBC 8.7 08/23/2014   HGB 17.2* 08/23/2014   HCT 49.1* 08/23/2014   MCV 88.0 08/23/2014   PLT 171 08/23/2014   Lab Results  Component Value Date   CREATININE 0.70 07/19/2015   BUN 8 07/19/2015   NA 138 07/19/2015   K 4.7 07/19/2015   CL 103 07/19/2015   CO2 27 07/19/2015    No results found for: HGBA1C Lipid Panel     Component Value Date/Time   CHOL 228* 07/19/2015 1156   TRIG 192* 07/19/2015 1156   HDL 50 07/19/2015 1156   CHOLHDL 4.6 07/19/2015 1156   VLDL 38* 07/19/2015 1156   LDLCALC 140* 07/19/2015 1156       Assessment and plan:   Brita was seen today for gynecologic exam.  Diagnoses and all orders for this visit:  Papanicolaou smear -     Cervicovaginal ancillary only No cytology was completed today since patient has had surgical removal of uterus and cervix. Wet prep and GC/Chylamydia completed.   Return if symptoms worsen or fail to improve.       Lance Bosch, [facility] [phone] 10/25/2015, 2:07 PM

## 2015-10-25 NOTE — Progress Notes (Signed)
Patient here for her annual pap only

## 2015-10-26 ENCOUNTER — Encounter: Payer: Self-pay | Admitting: Clinical

## 2015-10-26 LAB — CERVICOVAGINAL ANCILLARY ONLY
Chlamydia: NEGATIVE
NEISSERIA GONORRHEA: NEGATIVE
Wet Prep (BD Affirm): POSITIVE — AB

## 2015-10-26 NOTE — Progress Notes (Signed)
Depression screen [facility] 2/9 10/25/2015 07/27/2015 07/19/2015 02/03/2015 01/26/2015  Decreased Interest 1 0 0 0 0  Down, Depressed, Hopeless 0 1 0 0 0  PHQ - 2 Score 1 1 0 0 0  Altered sleeping - - - - -  Tired, decreased energy - - - - -  Change in appetite - - - - -  Feeling bad or failure about yourself  - - - - -  Trouble concentrating - - - - -  Moving slowly or fidgety/restless - - - - -  Suicidal thoughts - - - - -  PHQ-9 Score - - - - -   10-25-15 PHQ9: 5 (1,0,1,1,2,0,0,0,0)  GAD 7 : Generalized Anxiety Score 10/25/2015  Nervous, Anxious, on Edge 1  Control/stop worrying 1  Worry too much - different things 1  Trouble relaxing 0  Restless 0  Easily annoyed or irritable 1  Afraid - awful might happen 0  Total GAD 7 Score 4

## 2015-10-31 ENCOUNTER — Telehealth: Payer: Self-pay

## 2015-10-31 MED ORDER — METRONIDAZOLE 500 MG PO TABS: 500.0000 mg | ORAL_TABLET | Freq: Two times a day (BID) | ORAL | Status: DC

## 2015-10-31 NOTE — Telephone Encounter (Signed)
-----   Message from Lance Bosch, NP sent at 10/30/2015  9:03 PM EDT ----- Patient positive for Bacterial Vaginosis . Please explain this is not a STD, but a imbalance of the vaginal pH. Please send Flagyl 500 mg BID for 7 days. No refills, no alcohol while on this medication.

## 2015-10-31 NOTE — Telephone Encounter (Signed)
Tried to contact patient Patient not available Message left on voice mail to return our call 

## 2015-11-01 ENCOUNTER — Telehealth: Payer: Self-pay

## 2015-11-01 NOTE — Telephone Encounter (Signed)
Returned patient phone call and she is aware of her  Pap results

## 2015-11-01 NOTE — Telephone Encounter (Signed)
Patient returned nurse phone call.

## 2015-11-16 NOTE — Progress Notes (Signed)
Patient ID: Raven Torres, female   DOB: Jul 02, 1956, 60 y.o.   MRN: [id number] CC: Follow up  HPI:  Raven Torres is a 60 y.o. female here today for a follow up visit. Patient has past medical history of hep C, RA, HTN, fibromyalgia and smoking. Patient reports SOB on exertion and raised area on left breast. Patient reports having SOB with trying to complete activities. Patient requesting smoking cessation patches. Patient feels if she stops smoking she will feel better. Patient states she has been applying her sister's cancer cream to raised are on left breast and applying a bandaid. Patient state the area painful to touch, but denies drainage or fever.   Allergies   Allergen  Reactions   .  Codeine  Nausea Only   .  Tramadol  Nausea Only    Past Medical History   Diagnosis  Date   .  Arthritis    .  Colitis    .  Hepatitis C     Current Outpatient Prescriptions on File Prior to Visit   Medication  Sig  Dispense  Refill   .  cyclobenzaprine (FLEXERIL) 10 MG tablet  Take 1 tablet (10 mg total) by mouth 3 (three) times daily as needed for muscle spasms.  30 tablet  3   .  gabapentin (NEURONTIN) 300 MG capsule  Take 1 capsule (300 mg total) by mouth 2 (two) times daily.  60 capsule  3   .  acetaminophen (TYLENOL) 500 MG tablet  Take 500-1,000 mg by mouth daily.     Marland Kitchen  ibuprofen (ADVIL,MOTRIN) 600 MG tablet  Take 1 tablet (600 mg total) by mouth every 8 (eight) hours as needed.  60 tablet  0   .  nystatin cream (MYCOSTATIN)  Apply 1 application topically 2 (two) times daily.  30 g  0    No current facility-administered medications on file prior to visit.    Family History   Problem  Relation  Age of Onset   .  Heart disease  Mother    .  Cancer  Mother     Social History    Social History   .  Marital Status:  Married     Spouse Name:  N/A   .  Number of Children:  N/A   .  Years of Education:  N/A    Occupational History   .  Not on file.    Social History Main Topics   .   Smoking status:  Current Every Day Smoker -- 0.50 packs/day     Types:  Cigarettes   .  Smokeless tobacco:  Never Used      Comment: smoking 15-20 cigs daily   .  Alcohol Use:  No   .  Drug Use:  No   .  Sexual Activity:  No    Other Topics  Concern   .  Not on file    Social History Narrative    Review of Systems:  Constitutional: Negative for fever, chills, diaphoresis, activity change, appetite change and fatigue.  HENT: Negative for ear pain, nosebleeds, congestion, facial swelling, rhinorrhea, neck pain, neck stiffness and ear discharge.  Eyes: Negative for pain, discharge, redness, itching and visual disturbance.  Respiratory: Negative for cough, choking, chest tightness, shortness of breath, wheezing and stridor.  Cardiovascular: Negative for chest pain, palpitations and leg swelling.  Gastrointestinal: Negative for abdominal distention.  Genitourinary: Negative for dysuria, urgency, frequency, hematuria, flank pain, decreased urine volume, difficulty  urinating and dyspareunia.  Musculoskeletal: Negative for back pain, joint swelling, arthralgias and gait problem.  Neurological: Negative for dizziness, tremors, seizures, syncope, facial asymmetry, speech difficulty, weakness, light-headedness, numbness and headaches.  Hematological: Negative for adenopathy. Does not bruise/bleed easily.  Psychiatric/Behavioral: Negative for hallucinations, behavioral problems, confusion, dysphoric mood, decreased concentration and agitation.  Objective:    Filed Vitals:    10/12/15 1601   BP:  148/78   Pulse:  88   Temp:  98 F (36.7 C)   Resp:  16    Physical Exam:  Constitutional: Patient appears well-developed and well-nourished. No distress.  HENT: Normocephalic, atraumatic, External right and left ear normal.  Eyes: Conjunctivae are normal. no scleral icterus.  Neck: Normal ROM. Neck supple. No JVD. No tracheal deviation.  CVS: RRR, S1/S2 +, no murmurs, no gallops, no carotid  bruit.  Pulmonary: Expiratory wheezing. Effort and breath sounds normal, no stridor, rhonchi, rales.  Abdominal: Soft. BS +, no distension, tenderness, rebound or guarding.  Musculoskeletal: Normal range of motion. No edema and no tenderness.  Skin: seborrheic keratosis to left breast Skin is warm and dry. No rash noted. Not diaphoretic. No erythema. No pallor. Psychiatric: Normal mood and affect. Behavior, judgment, thought content normal.   Recent Labs   Lab Results    Component  Value  Date     WBC  8.7  08/23/2014     HGB  17.2*  08/23/2014     HCT  49.1*  08/23/2014     MCV  88.0  08/23/2014     PLT  171  08/23/2014     Recent Labs   Lab Results    Component  Value  Date     CREATININE  0.70  07/19/2015     BUN  8  07/19/2015     NA  138  07/19/2015     K  4.7  07/19/2015     CL  103  07/19/2015     CO2  27  07/19/2015     Recent Labs   No results found for: HGBA1C   Lipid Panel   Labs (Brief)      Component  Value  Date/Time     CHOL  228*  07/19/2015 1156     TRIG  192*  07/19/2015 1156     HDL  50  07/19/2015 1156     CHOLHDL  4.6  07/19/2015 1156     VLDL  38*  07/19/2015 1156     LDLCALC  140*  07/19/2015 1156    Assessment and plan:   Raven Torres was seen today for follow-up.  Diagnoses and all orders for this visit:  Tobacco abuse  - nicotine (NICODERM CQ - DOSED IN MG/24 HOURS) 21 mg/24hr patch; Place 1 patch (21 mg total) onto the skin daily.  Patient encouraged to quit smoking and apply patches are ordered.  High cholesterol  - atorvastatin (LIPITOR) 20 MG tablet; Take 1 tablet (20 mg total) by mouth daily.  Patient to continue Lipitor as ordered. Labs to be completed at next visit.  Essential hypertension  - lisinopril (PRINIVIL,ZESTRIL) 10 MG tablet; Take 1 tablet (10 mg total) by mouth daily.  Patient's BP is controlled with current dose. Patient instructed to continue medication as ordered.  SOB (shortness of breath)  - albuterol (PROVENTIL  HFA;VENTOLIN HFA) 108 (90 Base) MCG/ACT inhaler; Inhale 2 puffs into the lungs every 6 (six) hours as needed for wheezing or shortness of breath.  Patient to  start albuterol inhaler for SOB or wheezing. Patient to follow up if symptoms worsen.    Return in about 3 months (around 01/12/2016) for Hypertension.  Lance Bosch, NP 11/16/2015 11:05 PM

## 2015-11-23 ENCOUNTER — Telehealth: Payer: Self-pay | Admitting: Internal Medicine

## 2015-11-23 ENCOUNTER — Other Ambulatory Visit: Payer: Self-pay | Admitting: Internal Medicine

## 2015-11-23 NOTE — Telephone Encounter (Signed)
Patient called requesting a medication refill for gabapentin. Please follow up.

## 2015-11-29 ENCOUNTER — Other Ambulatory Visit: Payer: Self-pay | Admitting: *Deleted

## 2015-11-29 NOTE — Telephone Encounter (Signed)
Gabapentin was refilled on 11/27/15

## 2015-11-29 NOTE — Telephone Encounter (Signed)
Gabapentin was refilled on 11/27/15.

## 2016-01-18 ENCOUNTER — Telehealth: Payer: Self-pay | Admitting: *Deleted

## 2016-01-18 NOTE — Telephone Encounter (Signed)
Faxed prior authorization request to [employer]med[employer] 6/12 for ultrasound scheduled for 6/22.  Contacted [employer], patient's case is still in review 6/15. Will continue to follow. [phone]

## 2016-01-23 NOTE — Telephone Encounter (Signed)
Approved [id number] Patient aware, confirmed appointments and instructions (NPO midnight, arrive 15 minutes early). Landis Gandy, RN

## 2016-01-25 ENCOUNTER — Ambulatory Visit (HOSPITAL_COMMUNITY)
Admission: RE | Admit: 2016-01-25 | Discharge: 2016-01-25 | Disposition: A | Discharge status: Home / Self Care | Payer: Medicaid Other | Source: Ambulatory Visit | Attending: Internal Medicine | Admitting: Internal Medicine

## 2016-01-25 ENCOUNTER — Ambulatory Visit (HOSPITAL_COMMUNITY): Payer: Medicaid Other

## 2016-01-25 DIAGNOSIS — B182 Chronic viral hepatitis C: Secondary | ICD-10-CM | POA: Insufficient documentation

## 2016-01-25 DIAGNOSIS — K7460 Unspecified cirrhosis of liver: Secondary | ICD-10-CM | POA: Insufficient documentation

## 2016-01-29 ENCOUNTER — Other Ambulatory Visit: Payer: Self-pay | Admitting: Internal Medicine

## 2016-01-30 ENCOUNTER — Encounter: Payer: Self-pay | Admitting: Internal Medicine

## 2016-01-30 ENCOUNTER — Ambulatory Visit (INDEPENDENT_AMBULATORY_CARE_PROVIDER_SITE_OTHER): Payer: Medicaid Other | Admitting: Internal Medicine

## 2016-01-30 VITALS — BP 153/73 | HR 84 | Temp 97.4°F | Ht 66.0 in | Wt 191.0 lb

## 2016-01-30 DIAGNOSIS — K7460 Unspecified cirrhosis of liver: Secondary | ICD-10-CM

## 2016-01-30 DIAGNOSIS — F17200 Nicotine dependence, unspecified, uncomplicated: Secondary | ICD-10-CM

## 2016-01-30 DIAGNOSIS — R188 Other ascites: Principal | ICD-10-CM

## 2016-01-30 DIAGNOSIS — Z720 Tobacco use: Secondary | ICD-10-CM | POA: Diagnosis not present

## 2016-01-30 NOTE — Progress Notes (Signed)
   Subjective:    Patient ID: Raven Torres, female    DOB: 1956-01-04, 60 y.o.   MRN: [id number]  HPI She is here for follow-up of hepatitis C and cirrhosis.    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 Her elastography was consistent with F3 to F4. She has completed 12 weeks of Harvoni. Initial viral load undetectable. SVR12 and now SVR24 done and are undetectable.  [place] screen with ultrasound negative for mass.      Review of Systems  Constitutional: Negative for fatigue and unexpected weight change.  Gastrointestinal: Negative for nausea.  Skin: Negative for rash.  Neurological: Negative for dizziness and light-headedness.       Objective:   Physical Exam  Constitutional: She appears well-developed and well-nourished. No distress.  Eyes: No scleral icterus.  Cardiovascular: Normal rate, regular rhythm and normal heart sounds.   No murmur heard. Pulmonary/Chest: Effort normal and breath sounds normal. No respiratory distress.  Skin: No rash noted.          Assessment & Plan:

## 2016-01-30 NOTE — Assessment & Plan Note (Signed)
Screening ok.  Will continue with every 6 months ultrasound.

## 2016-01-30 NOTE — Assessment & Plan Note (Signed)
Advised cessation 

## 2016-02-07 ENCOUNTER — Other Ambulatory Visit: Payer: Self-pay | Admitting: Internal Medicine

## 2016-02-21 ENCOUNTER — Other Ambulatory Visit: Payer: Self-pay | Admitting: Internal Medicine

## 2016-02-21 NOTE — Telephone Encounter (Signed)
Refill request for Proventil

## 2016-02-26 ENCOUNTER — Other Ambulatory Visit: Payer: Self-pay | Admitting: Internal Medicine

## 2016-02-26 ENCOUNTER — Encounter: Payer: Self-pay | Admitting: Internal Medicine

## 2016-02-26 ENCOUNTER — Ambulatory Visit: Payer: Medicaid Other | Attending: Internal Medicine | Admitting: Internal Medicine

## 2016-02-26 VITALS — BP 121/79 | HR 76 | Temp 97.6°F | Resp 18 | Wt 190.8 lb

## 2016-02-26 DIAGNOSIS — M797 Fibromyalgia: Secondary | ICD-10-CM

## 2016-02-26 DIAGNOSIS — E7800 Pure hypercholesterolemia, unspecified: Secondary | ICD-10-CM | POA: Diagnosis not present

## 2016-02-26 DIAGNOSIS — M069 Rheumatoid arthritis, unspecified: Secondary | ICD-10-CM | POA: Diagnosis not present

## 2016-02-26 DIAGNOSIS — Z720 Tobacco use: Secondary | ICD-10-CM

## 2016-02-26 DIAGNOSIS — I10 Essential (primary) hypertension: Secondary | ICD-10-CM

## 2016-02-26 DIAGNOSIS — R300 Dysuria: Secondary | ICD-10-CM

## 2016-02-26 DIAGNOSIS — E785 Hyperlipidemia, unspecified: Secondary | ICD-10-CM

## 2016-02-26 DIAGNOSIS — B182 Chronic viral hepatitis C: Secondary | ICD-10-CM

## 2016-02-26 LAB — POCT URINALYSIS DIPSTICK
Bilirubin, UA: NEGATIVE
Blood, UA: NEGATIVE
GLUCOSE UA: NEGATIVE
Ketones, UA: NEGATIVE
Nitrite, UA: NEGATIVE
PROTEIN UA: NEGATIVE
Spec Grav, UA: <=1.005
UROBILINOGEN UA: 0.2
pH, UA: 6

## 2016-02-26 LAB — CBC WITH DIFFERENTIAL/PLATELET
BASOS PCT: 0 %
Basophils Absolute: 0 cells/uL (ref 0–200)
EOS PCT: 2 %
Eosinophils Absolute: 160 cells/uL (ref 15–500)
HCT: 46.7 % — ABNORMAL HIGH (ref 35.0–45.0)
HEMOGLOBIN: 15.8 g/dL — AB (ref 11.7–15.5)
LYMPHS ABS: 2800 {cells}/uL (ref 850–3900)
Lymphocytes Relative: 35 %
MCH: 30.4 pg (ref 27.0–33.0)
MCHC: 33.8 g/dL (ref 32.0–36.0)
MCV: 90 fL (ref 80.0–100.0)
MONOS PCT: 9 %
MPV: 9.7 fL (ref 7.5–12.5)
Monocytes Absolute: 720 cells/uL (ref 200–950)
NEUTROS ABS: 4320 {cells}/uL (ref 1500–7800)
Neutrophils Relative %: 54 %
PLATELETS: 155 10*3/uL (ref 140–400)
RBC: 5.19 MIL/uL — AB (ref 3.80–5.10)
RDW: 13.6 % (ref 11.0–15.0)
WBC: 8 10*3/uL (ref 3.8–10.8)

## 2016-02-26 LAB — CMP AND LIVER
ALBUMIN: 4.1 g/dL (ref 3.6–5.1)
ALK PHOS: 52 U/L (ref 33–130)
ALT: 13 U/L (ref 6–29)
AST: 27 U/L (ref 10–35)
BUN: 8 mg/dL (ref 7–25)
Bilirubin, Direct: 0.1 mg/dL (ref <=0.2)
CALCIUM: 9.1 mg/dL (ref 8.6–10.4)
CHLORIDE: 107 mmol/L (ref 98–110)
CO2: 24 mmol/L (ref 20–31)
CREATININE: 0.7 mg/dL (ref 0.50–0.99)
GLUCOSE: 103 mg/dL — AB (ref 65–99)
Indirect Bilirubin: 0.5 mg/dL (ref 0.2–1.2)
POTASSIUM: 3.9 mmol/L (ref 3.5–5.3)
SODIUM: 137 mmol/L (ref 135–146)
Total Bilirubin: 0.6 mg/dL (ref 0.2–1.2)
Total Protein: 7 g/dL (ref 6.1–8.1)

## 2016-02-26 LAB — LIPID PANEL
CHOLESTEROL: 153 mg/dL (ref 125–200)
HDL: 62 mg/dL (ref >=46)
LDL Cholesterol: 63 mg/dL (ref <130)
TRIGLYCERIDES: 139 mg/dL (ref <150)
Total CHOL/HDL Ratio: 2.5 Ratio (ref <=5.0)
VLDL: 28 mg/dL (ref <30)

## 2016-02-26 LAB — RHEUMATOID FACTOR: Rhuematoid fact SerPl-aCnc: 27 IU/mL — ABNORMAL HIGH (ref <=14)

## 2016-02-26 MED ORDER — ATORVASTATIN CALCIUM 20 MG PO TABS: 20.0000 mg | ORAL_TABLET | Freq: Every day | ORAL | 3 refills | Status: DC

## 2016-02-26 MED ORDER — ALBUTEROL SULFATE HFA 108 (90 BASE) MCG/ACT IN AERS
2.0000 | INHALATION_SPRAY | RESPIRATORY_TRACT | 11 refills | Status: DC | PRN
Start: 2016-02-26 — End: 2016-05-01

## 2016-02-26 MED ORDER — GABAPENTIN 300 MG PO CAPS: 300.0000 mg | ORAL_CAPSULE | Freq: Two times a day (BID) | ORAL | 2 refills | Status: DC

## 2016-02-26 MED ORDER — METRONIDAZOLE 500 MG PO TABS: 500.0000 mg | ORAL_TABLET | Freq: Two times a day (BID) | ORAL | 0 refills | Status: DC

## 2016-02-26 MED ORDER — LISINOPRIL 10 MG PO TABS: 10.0000 mg | ORAL_TABLET | Freq: Every day | ORAL | 3 refills | Status: DC

## 2016-02-26 NOTE — Progress Notes (Signed)
Pt is in the office today for hypertension Pt states she took her bp medication before coming to her appt Pt states her pain level is a 6 today in the office Pt states the pain is coming from her mid and lower back

## 2016-02-26 NOTE — Patient Instructions (Signed)

## 2016-02-26 NOTE — Progress Notes (Signed)
Raven Torres, is a 60 y.o. female  [id number]  [id number]  DOB - [birth year]  CC:  Chief Complaint  Patient presents with  . Hypertension       HPI: Raven Torres is a 60 y.o. female here today to establish medical care, last seen in clinic 10/25/15 w/ significant pmhx of RA, htn, fibromyalgia, hep c cirrhosis, and tob abuse.  Per pt, for last few months, has been having more body aches, especially bilateral shoulders and right hip w/ pain radiating down her right leg at times.  She wakes up very stiff all over, and takes a few hours of moving around for the stiffness to improve.  She states she use to see a doctor, could not tell me who, who dx her w/ RA and gave her injections to left shoulder.  She is requesting a referral to "arthritis" specialist today.   She still smokes about 4-7cigs/ day. Denies etoh.    She also is complaining of "foul  Vaginal odor" again.  Last time dx w/ BV, took flagyl w/ good results.  She denies any abnml vagina discharge or pruritis.  Patient has No headache, No chest pain, No abdominal pain - No Nausea, No new weakness tingling or numbness, No Cough - SOB.    Review of Systems: Per HPI, o/w all systems reviewed and negative.   Allergies  Allergen Reactions  . Codeine Nausea Only  . Tramadol Nausea Only   Past Medical History:  Diagnosis Date  . Arthritis   . Colitis   . Hepatitis C    Current Outpatient Prescriptions on File Prior to Visit  Medication Sig Dispense Refill  . acetaminophen (TYLENOL) 500 MG tablet Take 500-1,000 mg by mouth daily.    . cyclobenzaprine (FLEXERIL) 10 MG tablet Take 1 tablet (10 mg total) by mouth 3 (three) times daily as needed for muscle spasms. 30 tablet 3  . diclofenac sodium (VOLTAREN) 1 % GEL Apply 2 g topically 4 (four) times daily. 1 Tube 2  . nystatin cream (MYCOSTATIN) Apply 1 application topically 2 (two) times daily. 30 g 0  . ibuprofen (ADVIL,MOTRIN) 600 MG tablet Take 1 tablet (600  mg total) by mouth every 8 (eight) hours as needed. (Patient not taking: Reported on 01/30/2016) 60 tablet 0  . nicotine (NICODERM CQ - DOSED IN MG/24 HOURS) 21 mg/24hr patch Place 1 patch (21 mg total) onto the skin daily. (Patient not taking: Reported on 02/26/2016) 28 patch 1   No current facility-administered medications on file prior to visit.    Family History  Problem Relation Age of Onset  . Heart disease Mother   . Cancer Mother    Social History   Social History  . Marital status: Married    Spouse name: N/A  . Number of children: N/A  . Years of education: N/A   Occupational History  . Not on file.   Social History Main Topics  . Smoking status: Current Every Day Smoker    Packs/day: 0.50    Types: Cigarettes  . Smokeless tobacco: Never Used     Comment: smoking 10 cigs daily  . Alcohol use No  . Drug use: No  . Sexual activity: No   Other Topics Concern  . Not on file   Social History Narrative  . No narrative on file    Objective:   Vitals:   02/26/16 1102  BP: 121/79  Pulse: 76  Resp: 18  Temp: 97.6 F (36.4 C)  Filed Weights   02/26/16 1102  Weight: 190 lb 12.8 oz (86.5 kg)    BP Readings from Last 3 Encounters:  02/26/16 121/79  01/30/16 (!) 153/73  10/25/15 (!) 146/77    Physical Exam: Constitutional: Patient appears well-developed and well-nourished. No distress. AAOx3, pleasant. HENT: Normocephalic, atraumatic, External right and left ear normal. Oropharynx is clear and moist.  Eyes: Conjunctivae and EOM are normal. PERRL, no scleral icterus. Neck: Normal ROM. Neck supple. No JVD.  CVS: RRR, S1/S2 +, no murmurs, no gallops, no carotid bruit.  Pulmonary: Effort and breath sounds normal, no stridor, rhonchi, wheezes, rales.  Abdominal: Soft. BS +, no distension, tenderness, rebound or guarding.  Musculoskeletal: range of motion limited to 90deg bilat shoulders, ttp on flexion of right shoulder /scapular region against force.  Mild  ttp diffuse, bilateral paraspinus area, unable to find specific point of tenderness though,neg straight leg test bilat, neg tripod sign.   No edema and no tenderness.  LE: bilat/ no c/c/e, pulses 2+ bilateral. Neuro: Alert. muscle tone coordination wnl. No cranial nerve deficit grossly. Skin: Skin is warm and dry. No rash noted. Not diaphoretic. No erythema. No pallor. Psychiatric: Normal mood and affect. Behavior, judgment, thought content normal.  Lab Results  Component Value Date   WBC 8.7 08/23/2014   HGB 17.2 (H) 08/23/2014   HCT 49.1 (H) 08/23/2014   MCV 88.0 08/23/2014   PLT 171 08/23/2014   Lab Results  Component Value Date   CREATININE 0.70 07/19/2015   BUN 8 07/19/2015   NA 138 07/19/2015   K 4.7 07/19/2015   CL 103 07/19/2015   CO2 27 07/19/2015    No results found for: HGBA1C Lipid Panel     Component Value Date/Time   CHOL 228 (H) 07/19/2015 1156   TRIG 192 (H) 07/19/2015 1156   HDL 50 07/19/2015 1156   CHOLHDL 4.6 07/19/2015 1156   VLDL 38 (H) 07/19/2015 1156   LDLCALC 140 (H) 07/19/2015 1156       Depression screen PHQ 2/9 02/26/2016 01/30/2016 10/25/2015 07/27/2015 07/19/2015  Decreased Interest 1 1 1  0 0  Down, Depressed, Hopeless 0 1 0 1 0  PHQ - 2 Score 1 2 1 1  0  Altered sleeping - 1 - - -  Tired, decreased energy - 1 - - -  Change in appetite - 0 - - -  Feeling bad or failure about yourself  - 1 - - -  Trouble concentrating - 1 - - -  Moving slowly or fidgety/restless - 1 - - -  Suicidal thoughts - 0 - - -  PHQ-9 Score - 7 - - -  Difficult doing work/chores - Somewhat difficult - - -    Assessment and plan:   1. Hyperlipidema - currently fasting. - atorvastatin (LIPITOR) 20 MG tablet; Take 1 tablet (20 mg total) by mouth daily.  Dispense: 90 tablet; Refill: 3 - CBC with Differential - CMP and Liver - Lipid panel  2. Rheumatoid arthritis involving both shoulders, unspecified rheumatoid factor presence (HCC) Vs fibromyalgia ? - did not  see RA labs in past, will chk. - Ambulatory referral to Rheumatology - DG Lumbar Spine Complete; Future - DG Cervical Spine Complete; Future - DG Thoracic Spine W/Swimmers; Future - Rheumatoid factor - ANA,IFA RA Diag Pnl w/rflx Tit/Patn  3. Dysuria - Urinalysis Dipstick - negative  4. Tobacco abuse tob cessation recd, tips provided  5. Essential hypertension Controlled,  - renewed lisinopril 10qd, -dash diet discussed - CBC  with Differential  6. Chronic hepatitis C without hepatic coma [facility]) Being followed by ID, Dr Linus Salmons, [facility] [place], last seen 01/30/16, doing q85month [place] f/us  7. BV, recurrent? "foul odor" but no dishcharge - neg UA, recd probiotics, flagyl 500bid x 7 days emperically. - pt sp hysterectomy, does not have cervix.  Return in about 3 months (around 05/28/2016).  The patient was given clear instructions to go to ER or return to medical center if symptoms don't improve, worsen or new problems develop. The patient verbalized understanding. The patient was told to call to get lab results if they haven't heard anything in the next week.    This note has been created with Surveyor, quantity. Any transcriptional errors are unintentional.   Maren Reamer, MD, [place], [place]   02/26/2016, 12:10 PM

## 2016-02-26 NOTE — Telephone Encounter (Signed)
Rx request 

## 2016-02-27 LAB — ANA,IFA RA DIAG PNL W/RFLX TIT/PATN: ANA: NEGATIVE

## 2016-02-28 ENCOUNTER — Telehealth: Payer: Self-pay

## 2016-02-28 NOTE — Telephone Encounter (Signed)
Contacted pt to go over lab results pt is aware of results and doesn't have any questions or concerns 

## 2016-05-01 ENCOUNTER — Other Ambulatory Visit: Payer: Self-pay | Admitting: Internal Medicine

## 2016-05-06 ENCOUNTER — Other Ambulatory Visit: Payer: Self-pay | Admitting: Internal Medicine

## 2016-05-19 ENCOUNTER — Other Ambulatory Visit: Payer: Self-pay | Admitting: Internal Medicine

## 2016-06-03 ENCOUNTER — Encounter: Payer: Self-pay | Admitting: Internal Medicine

## 2016-06-03 NOTE — Progress Notes (Signed)
Pt seen by Rheum, Dr Hurley Cisco, 04/22/16 Dx w/ FMS, but pain in Right low back different, may need xray or mri.  rx motrin 600mg  bid prn given. Fu prn.  Will scan note.

## 2016-06-10 ENCOUNTER — Telehealth: Payer: Self-pay | Admitting: *Deleted

## 2016-06-10 NOTE — Telephone Encounter (Signed)
-----   Message from Georgena Spurling, [place] sent at 01/30/2016 10:00 AM EDT ----- Regarding: ultrasound 07/2016 Prior auth for ultrasound 06/2016

## 2016-06-10 NOTE — Telephone Encounter (Signed)
Prior Authorization initiated for follow up abdominal ultrasound. Answered nursing questions and now in case review [id number]. Was told would receive an answer via fax or phone within 48 hours on determination as to whether this has been approved. Raven Torres

## 2016-06-12 ENCOUNTER — Other Ambulatory Visit: Payer: Self-pay | Admitting: Internal Medicine

## 2016-06-12 DIAGNOSIS — K7469 Other cirrhosis of liver: Secondary | ICD-10-CM

## 2016-06-12 NOTE — Telephone Encounter (Signed)
Prior authorization for limited abdominal ultrasound approved through 07/12/16. Patient notified of appt at [place] at [facility] for 07/08/16. She is aware nothing to eat or drink after midnight. We will call her once Dr. Linus Salmons reviews her test results. Raven Torres CMA

## 2016-06-28 ENCOUNTER — Other Ambulatory Visit: Payer: Self-pay | Admitting: Internal Medicine

## 2016-07-08 ENCOUNTER — Ambulatory Visit
Admission: RE | Admit: 2016-07-08 | Discharge: 2016-07-08 | Disposition: A | Discharge status: Home / Self Care | Payer: Medicaid Other | Source: Ambulatory Visit | Attending: Internal Medicine | Admitting: Internal Medicine

## 2016-07-08 DIAGNOSIS — K7469 Other cirrhosis of liver: Secondary | ICD-10-CM

## 2016-07-09 ENCOUNTER — Telehealth: Payer: Self-pay | Admitting: *Deleted

## 2016-07-09 NOTE — Telephone Encounter (Signed)
Patient notified Raven Torres  

## 2016-07-09 NOTE — Telephone Encounter (Signed)
-----   Message from Thayer Headings, MD sent at 07/08/2016  3:51 PM EST ----- Liver ultrasound looks good, no new issues. Please let her know. thanks

## 2016-07-22 ENCOUNTER — Other Ambulatory Visit: Payer: Self-pay | Admitting: Internal Medicine

## 2016-08-16 ENCOUNTER — Other Ambulatory Visit: Payer: Self-pay | Admitting: Internal Medicine

## 2016-08-24 ENCOUNTER — Other Ambulatory Visit: Payer: Self-pay | Admitting: Internal Medicine

## 2016-09-13 ENCOUNTER — Other Ambulatory Visit: Payer: Self-pay | Admitting: Internal Medicine

## 2016-09-21 ENCOUNTER — Other Ambulatory Visit: Payer: Self-pay | Admitting: Internal Medicine

## 2016-09-30 ENCOUNTER — Telehealth: Payer: Self-pay | Admitting: Internal Medicine

## 2016-09-30 NOTE — Telephone Encounter (Signed)
Pt. Called requesting a refill on all her current medication. Pt. States she is not able to come in for an appt. B/c her husband was diagnosed with stage 4 cancer. Please f/u with pt.

## 2016-10-01 ENCOUNTER — Other Ambulatory Visit: Payer: Self-pay | Admitting: Internal Medicine

## 2016-10-01 DIAGNOSIS — E7800 Pure hypercholesterolemia, unspecified: Secondary | ICD-10-CM

## 2016-10-01 MED ORDER — GABAPENTIN 300 MG PO CAPS: ORAL_CAPSULE | ORAL | 0 refills | Status: DC

## 2016-10-01 MED ORDER — LISINOPRIL 10 MG PO TABS: 10.0000 mg | ORAL_TABLET | Freq: Every day | ORAL | 0 refills | Status: DC

## 2016-10-01 MED ORDER — ATORVASTATIN CALCIUM 20 MG PO TABS: 20.0000 mg | ORAL_TABLET | Freq: Every day | ORAL | 0 refills | Status: DC

## 2016-10-01 MED ORDER — ALBUTEROL SULFATE HFA 108 (90 BASE) MCG/ACT IN AERS: INHALATION_SPRAY | RESPIRATORY_TRACT | 0 refills | Status: DC

## 2016-10-01 NOTE — Telephone Encounter (Signed)
Will forward to pcp

## 2016-10-01 NOTE — Telephone Encounter (Signed)
[place]-  I refilled albuterol inhaler, neurontin, atorvastatin, and lisinopril. 48month supply, she must have a fu for further refills.

## 2016-10-01 NOTE — Telephone Encounter (Signed)
Could you schedule pt an appointment  

## 2016-10-01 NOTE — Telephone Encounter (Signed)
Could you schedule an appointment  

## 2016-10-24 ENCOUNTER — Other Ambulatory Visit: Payer: Self-pay | Admitting: Internal Medicine

## 2016-11-07 ENCOUNTER — Telehealth: Payer: Self-pay

## 2016-11-07 NOTE — Telephone Encounter (Signed)
Called to find out if pt would like to schedule appt for colonoscopy

## 2016-11-17 ENCOUNTER — Other Ambulatory Visit: Payer: Self-pay | Admitting: Internal Medicine

## 2016-11-19 ENCOUNTER — Ambulatory Visit (HOSPITAL_COMMUNITY)
Admission: RE | Admit: 2016-11-19 | Discharge: 2016-11-19 | Disposition: A | Discharge status: Home / Self Care | Payer: Medicaid Other | Source: Ambulatory Visit | Attending: Internal Medicine | Admitting: Internal Medicine

## 2016-11-19 ENCOUNTER — Encounter: Payer: Self-pay | Admitting: Internal Medicine

## 2016-11-19 ENCOUNTER — Telehealth: Payer: Self-pay | Admitting: Internal Medicine

## 2016-11-19 ENCOUNTER — Ambulatory Visit: Payer: Medicaid Other | Attending: Internal Medicine | Admitting: Internal Medicine

## 2016-11-19 VITALS — BP 187/92 | HR 75 | Temp 97.4°F | Resp 16 | Wt 193.4 lb

## 2016-11-19 DIAGNOSIS — Z1321 Encounter for screening for nutritional disorder: Secondary | ICD-10-CM | POA: Diagnosis not present

## 2016-11-19 DIAGNOSIS — Z1211 Encounter for screening for malignant neoplasm of colon: Secondary | ICD-10-CM

## 2016-11-19 DIAGNOSIS — M25562 Pain in left knee: Secondary | ICD-10-CM

## 2016-11-19 DIAGNOSIS — R609 Edema, unspecified: Secondary | ICD-10-CM | POA: Diagnosis not present

## 2016-11-19 DIAGNOSIS — G4700 Insomnia, unspecified: Secondary | ICD-10-CM

## 2016-11-19 DIAGNOSIS — M1990 Unspecified osteoarthritis, unspecified site: Secondary | ICD-10-CM

## 2016-11-19 DIAGNOSIS — R300 Dysuria: Secondary | ICD-10-CM | POA: Diagnosis not present

## 2016-11-19 DIAGNOSIS — K7469 Other cirrhosis of liver: Secondary | ICD-10-CM | POA: Diagnosis not present

## 2016-11-19 DIAGNOSIS — F17200 Nicotine dependence, unspecified, uncomplicated: Secondary | ICD-10-CM | POA: Insufficient documentation

## 2016-11-19 DIAGNOSIS — M069 Rheumatoid arthritis, unspecified: Secondary | ICD-10-CM

## 2016-11-19 DIAGNOSIS — I10 Essential (primary) hypertension: Secondary | ICD-10-CM

## 2016-11-19 DIAGNOSIS — F4321 Adjustment disorder with depressed mood: Secondary | ICD-10-CM

## 2016-11-19 DIAGNOSIS — Z131 Encounter for screening for diabetes mellitus: Secondary | ICD-10-CM | POA: Diagnosis not present

## 2016-11-19 DIAGNOSIS — M797 Fibromyalgia: Secondary | ICD-10-CM

## 2016-11-19 DIAGNOSIS — IMO0001 Reserved for inherently not codable concepts without codable children: Secondary | ICD-10-CM

## 2016-11-19 DIAGNOSIS — B1920 Unspecified viral hepatitis C without hepatic coma: Secondary | ICD-10-CM | POA: Insufficient documentation

## 2016-11-19 DIAGNOSIS — Z0001 Encounter for general adult medical examination with abnormal findings: Secondary | ICD-10-CM | POA: Diagnosis not present

## 2016-11-19 DIAGNOSIS — Z23 Encounter for immunization: Secondary | ICD-10-CM

## 2016-11-19 LAB — POCT URINALYSIS DIPSTICK
BILIRUBIN UA: NEGATIVE
GLUCOSE UA: 500
KETONES UA: NEGATIVE
Leukocytes, UA: NEGATIVE
Nitrite, UA: NEGATIVE
Protein, UA: NEGATIVE
SPEC GRAV UA: 1.01 (ref 1.010–1.025)
Urobilinogen, UA: 0.2 E.U./dL
pH, UA: 7 (ref 5.0–8.0)

## 2016-11-19 LAB — POCT GLYCOSYLATED HEMOGLOBIN (HGB A1C): Hemoglobin A1C: 5.2

## 2016-11-19 MED ORDER — DICLOFENAC SODIUM 1 % TD GEL: 2.0000 g | Freq: Four times a day (QID) | TRANSDERMAL | 2 refills | Status: DC

## 2016-11-19 MED ORDER — ESCITALOPRAM OXALATE 10 MG PO TABS: 10.0000 mg | ORAL_TABLET | Freq: Every day | ORAL | 1 refills | Status: DC

## 2016-11-19 MED ORDER — LISINOPRIL-HYDROCHLOROTHIAZIDE 10-12.5 MG PO TABS: 1.0000 | ORAL_TABLET | Freq: Every day | ORAL | 3 refills | Status: DC

## 2016-11-19 MED ORDER — GABAPENTIN 300 MG PO CAPS: 300.0000 mg | ORAL_CAPSULE | Freq: Three times a day (TID) | ORAL | 3 refills | Status: DC

## 2016-11-19 MED ORDER — CYCLOBENZAPRINE HCL 10 MG PO TABS: 10.0000 mg | ORAL_TABLET | Freq: Three times a day (TID) | ORAL | 3 refills | Status: DC | PRN

## 2016-11-19 NOTE — Telephone Encounter (Signed)
61 year old patient was referred to our office for colonoscopy. Patient brought in previous gi records from [facility] to be reviewed. Patient not requesting any certain doctor. DOD is Dr.Pyrtle so records will be placed on his desk for review.

## 2016-11-19 NOTE — Patient Instructions (Addendum)
Raven Munson RN 2 wks bp check -    Calcium 1200 mg/ day  + vit D  - ?? Pending labs  -  For your tobacco abuse: Strongly recommend that he stop smoking back and all other inhaled products right away Call 1-800-Quit-Now to get free nicotine replacement from the state of [place]  -   Steps to Quit Smoking Smoking tobacco can be bad for your health. It can also affect almost every organ in your body. Smoking puts you and people around you at risk for many serious long-lasting (chronic) diseases. Quitting smoking is hard, but it is one of the best things that you can do for your health. It is never too late to quit. What are the benefits of quitting smoking? When you quit smoking, you lower your risk for getting serious diseases and conditions. They can include:  Lung cancer or lung disease.  Heart disease.  Stroke.  Heart attack.  Not being able to have children (infertility).  Weak bones (osteoporosis) and broken bones (fractures). If you have coughing, wheezing, and shortness of breath, those symptoms may get better when you quit. You may also get sick less often. If you are pregnant, quitting smoking can help to lower your chances of having a baby of low birth weight. What can I do to help me quit smoking? Talk with your doctor about what can help you quit smoking. Some things you can do (strategies) include:  Quitting smoking totally, instead of slowly cutting back how much you smoke over a period of time.  Going to in-person counseling. You are more likely to quit if you go to many counseling sessions.  Using resources and support systems, such as:  Online chats with a Social worker.  Phone quitlines.  Printed Furniture conservator/restorer.  Support groups or group counseling.  Text messaging programs.  Mobile phone apps or applications.  Taking medicines. Some of these medicines may have nicotine in them. If you are pregnant or breastfeeding, do not take any medicines to quit  smoking unless your doctor says it is okay. Talk with your doctor about counseling or other things that can help you. Talk with your doctor about using more than one strategy at the same time, such as taking medicines while you are also going to in-person counseling. This can help make quitting easier. What things can I do to make it easier to quit? Quitting smoking might feel very hard at first, but there is a lot that you can do to make it easier. Take these steps:  Talk to your family and friends. Ask them to support and encourage you.  Call phone quitlines, reach out to support groups, or work with a Social worker.  Ask people who smoke to not smoke around you.  Avoid places that make you want (trigger) to smoke, such as:  Bars.  Parties.  Smoke-break areas at work.  Spend time with people who do not smoke.  Lower the stress in your life. Stress can make you want to smoke. Try these things to help your stress:  Getting regular exercise.  Deep-breathing exercises.  Yoga.  Meditating.  Doing a body scan. To do this, close your eyes, focus on one area of your body at a time from head to toe, and notice which parts of your body are tense. Try to relax the muscles in those areas.  Download or buy apps on your mobile phone or tablet that can help you stick to your quit plan. There  are many free apps, such as QuitGuide from the [employer] manager for Disease Control and Prevention). You can find more support from smokefree.gov and other websites. This information is not intended to replace advice given to you by your health care provider. Make sure you discuss any questions you have with your health care provider. Document Released: 05/18/2009 Document Revised: 03/19/2016 Document Reviewed: 12/06/2014 Elsevier Interactive Patient Education  2017 [place].   -  Low-Sodium Eating Plan Sodium, which is an element that makes up salt, helps you maintain a healthy balance of fluids in  your body. Too much sodium can increase your blood pressure and cause fluid and waste to be held in your body. Your health care provider or dietitian may recommend following this plan if you have high blood pressure (hypertension), kidney disease, liver disease, or heart failure. Eating less sodium can help lower your blood pressure, reduce swelling, and protect your heart, liver, and kidneys. What are tips for following this plan? General guidelines   Most people on this plan should limit their sodium intake to 1,500-2,000 mg (milligrams) of sodium each day. Reading food labels   The Nutrition Facts label lists the amount of sodium in one serving of the food. If you eat more than one serving, you must multiply the listed amount of sodium by the number of servings.  Choose foods with less than 140 mg of sodium per serving.  Avoid foods with 300 mg of sodium or more per serving. Shopping   Look for lower-sodium products, often labeled as "low-sodium" or "no salt added."  Always check the sodium content even if foods are labeled as "unsalted" or "no salt added".  Buy fresh foods.  Avoid canned foods and premade or frozen meals.  Avoid canned, cured, or processed meats  Buy breads that have less than 80 mg of sodium per slice. Cooking   Eat more home-cooked food and less restaurant, buffet, and fast food.  Avoid adding salt when cooking. Use salt-free seasonings or herbs instead of table salt or sea salt. Check with your health care provider or pharmacist before using salt substitutes.  Cook with plant-based oils, such as canola, sunflower, or olive oil. Meal planning   When eating at a restaurant, ask that your food be prepared with less salt or no salt, if possible.  Avoid foods that contain MSG (monosodium glutamate). MSG is sometimes added to [place] food, bouillon, and some canned foods. What foods are recommended? The items listed may not be a complete list. Talk with your  dietitian about what dietary choices are best for you. Grains  Low-sodium cereals, including oats, puffed wheat and rice, and shredded wheat. Low-sodium crackers. Unsalted rice. Unsalted pasta. Low-sodium bread. Whole-grain breads and whole-grain pasta. Vegetables  Fresh or frozen vegetables. "No salt added" canned vegetables. "No salt added" tomato sauce and paste. Low-sodium or reduced-sodium tomato and vegetable juice. Fruits  Fresh, frozen, or canned fruit. Fruit juice. Meats and other protein foods  Fresh or frozen (no salt added) meat, poultry, seafood, and fish. Low-sodium canned tuna and salmon. Unsalted nuts. Dried peas, beans, and lentils without added salt. Unsalted canned beans. Eggs. Unsalted nut butters. Dairy  Milk. Soy milk. Cheese that is naturally low in sodium, such as ricotta cheese, fresh mozzarella, or Swiss cheese Low-sodium or reduced-sodium cheese. Cream cheese. Yogurt. Fats and oils  Unsalted butter. Unsalted margarine with no trans fat. Vegetable oils such as canola or olive oils. Seasonings and other foods  Fresh and dried  herbs and spices. Salt-free seasonings. Low-sodium mustard and ketchup. Sodium-free salad dressing. Sodium-free light mayonnaise. Fresh or refrigerated horseradish. Lemon juice. Vinegar. Homemade, reduced-sodium, or low-sodium soups. Unsalted popcorn and pretzels. Low-salt or salt-free chips. What foods are not recommended? The items listed may not be a complete list. Talk with your dietitian about what dietary choices are best for you. Grains  Instant hot cereals. Bread stuffing, pancake, and biscuit mixes. Croutons. Seasoned rice or pasta mixes. Noodle soup cups. Boxed or frozen macaroni and cheese. Regular salted crackers. Self-rising flour. Vegetables  Sauerkraut, pickled vegetables, and relishes. Olives. [place] fries. Onion rings. Regular canned vegetables (not low-sodium or reduced-sodium). Regular canned tomato sauce and paste (not low-sodium  or reduced-sodium). Regular tomato and vegetable juice (not low-sodium or reduced-sodium). Frozen vegetables in sauces. Meats and other protein foods  Meat or fish that is salted, canned, smoked, spiced, or pickled. Bacon, ham, sausage, hotdogs, corned beef, chipped beef, packaged lunch meats, salt pork, jerky, pickled herring, anchovies, regular canned tuna, sardines, salted nuts. Dairy  Processed cheese and cheese spreads. Cheese curds. Blue cheese. Feta cheese. String cheese. Regular cottage cheese. Buttermilk. Canned milk. Fats and oils  Salted butter. Regular margarine. Ghee. Bacon fat. Seasonings and other foods  Onion salt, garlic salt, seasoned salt, table salt, and sea salt. Canned and packaged gravies. Worcestershire sauce. Tartar sauce. Barbecue sauce. Teriyaki sauce. Soy sauce, including reduced-sodium. Steak sauce. Fish sauce. Oyster sauce. Cocktail sauce. Horseradish that you find on the shelf. Regular ketchup and mustard. Meat flavorings and tenderizers. Bouillon cubes. Hot sauce and Tabasco sauce. Premade or packaged marinades. Premade or packaged taco seasonings. Relishes. Regular salad dressings. Salsa. Potato and tortilla chips. Corn chips and puffs. Salted popcorn and pretzels. Canned or dried soups. Pizza. Frozen entrees and pot pies. Summary  Eating less sodium can help lower your blood pressure, reduce swelling, and protect your heart, liver, and kidneys.  Most people on this plan should limit their sodium intake to 1,500-2,000 mg (milligrams) of sodium each day.  Canned, boxed, and frozen foods are high in sodium. Restaurant foods, fast foods, and pizza are also very high in sodium. You also get sodium by adding salt to food.  Try to cook at home, eat more fresh fruits and vegetables, and eat less fast food, canned, processed, or prepared foods. This information is not intended to replace advice given to you by your health care provider. Make sure you discuss any questions  you have with your health care provider. Document Released: 01/11/2002 Document Revised: 07/15/2016 Document Reviewed: 07/15/2016 Elsevier Interactive Patient Education  2017 [place] for Routine Care of Injuries Many injuries can be cared for using rest, ice, compression, and elevation (RICE therapy). Using RICE therapy can help to lessen pain and swelling. It can help your body to heal. Rest  Reduce your normal activities and avoid using the injured part of your body. You can go back to your normal activities when you feel okay and your doctor says it is okay. Ice  Do not put ice on your bare skin.  Put ice in a plastic bag.  Place a towel between your skin and the bag.  Leave the ice on for 20 minutes, 2-3 times a day. Do this for as long as told by your doctor. Compression  Compression means putting pressure on the injured area. This can be done with an elastic bandage. If an elastic bandage has been applied:  Remove and reapply the bandage every  3-4 hours or as told by your doctor.  Make sure the bandage is not wrapped too tight. Wrap the bandage more loosely if part of your body beyond the bandage is blue, swollen, cold, painful, or loses feeling (numb).  See your doctor if the bandage seems to make your problems worse. Elevation  Elevation means keeping the injured area raised. Raise the injured area above your heart or the center of your chest if you can. When should I get help? You should get help if:  You keep having pain and swelling.  Your symptoms get worse. Get help right away if: You should get help right away if:  You have sudden bad pain at or below the area of your injury.  You have redness or more swelling around your injury.  You have tingling or numbness at or below the injury that does not go away when you take off the bandage. This information is not intended to replace advice given to you by your health care provider. Make sure you  discuss any questions you have with your health care provider. Document Released: 01/08/2008 Document Revised: 06/18/2016 Document Reviewed: 06/29/2014 Elsevier Interactive Patient Education  2017 Else[employer] Inc[employer] -   Adjustment Disorder Adjustment disorder is an unusually severe reaction to a stressful life event, such as the loss of a job or physical illness. The event may be any stressful event other than the loss of a loved one. Adjustment disorder may affect your feelings, your thinking, how you act, or a combination of these. It may interfere with personal relationships or with the way you are at work, school, or home. People with this disorder are at risk for suicide and substance abuse. They may develop a more serious mental disorder, such as major depressive disorder or post-traumatic stress disorder. SIGNS AND SYMPTOMS  Symptoms may include:  Sadness, depressed mood, or crying spells.  Loss of enjoyment.  Change in appetite or weight.  Sense of loss or hopelessness.  Thoughts of suicide.  Anxiety, worry, or nervousness.  Trouble sleeping.  Avoiding family and friends.  Poor school performance.  Fighting or vandalism.  Reckless driving.  Skipping school.  Poor work Systems analyst.  Ignoring bills. Symptoms of adjustment disorder start within 3 months of the stressful life event. They do not last more than 6 months after the event has ended. DIAGNOSIS  To make a diagnosis, your health care provider will ask about what has happened in your life and how it has affected you. He or she may also ask about your medical history and use of medicines, alcohol, and other substances. Your health care provider may do a physical exam and order lab tests or other studies. You may be referred to a mental health specialist for evaluation. TREATMENT  Treatment options include:  Counseling or talk therapy. Talk therapy is usually provided by mental health specialists.  Medicine.  Certain medicines may help with depression, anxiety, and sleep.  Support groups. Support groups offer emotional support, advice, and guidance. They are made up of people who have had similar experiences. HOME CARE INSTRUCTIONS  Keep all follow-up visits as directed by your health care provider. This is important.  Take medicines only as directed by your health care provider. SEEK MEDICAL CARE IF:  Your symptoms get worse.  SEEK IMMEDIATE MEDICAL CARE IF: You have serious thoughts about hurting yourself or someone else. MAKE SURE YOU:  Understand these instructions.  Will watch your condition.  Will get help right away if you  are not doing well or get worse. This information is not intended to replace advice given to you by your health care provider. Make sure you discuss any questions you have with your health care provider. Document Released: 03/26/2006 Document Revised: 11/13/2015 Document Reviewed: 12/14/2013 Elsevier Interactive Patient Education  2017 Elsevier Inc.  -  Edema Edema is when you have too much fluid in your body or under your skin. Edema may make your legs, feet, and ankles swell up. Swelling is also common in looser tissues, like around your eyes. This is a common condition. It gets more common as you get older. There are many possible causes of edema. Eating too much salt (sodium) and being on your feet or sitting for a long time can cause edema in your legs, feet, and ankles. Hot weather may make edema worse. Edema is usually painless. Your skin may look swollen or shiny. Follow these instructions at home:  Keep the swollen body part raised (elevated) above the level of your heart when you are sitting or lying down.  Do not sit still or stand for a long time.  Do not wear tight clothes. Do not wear garters on your upper legs.  Exercise your legs. This can help the swelling go down.  Wear elastic bandages or support stockings as told by your doctor.  Eat a  low-salt (low-sodium) diet to reduce fluid as told by your doctor.  Depending on the cause of your swelling, you may need to limit how much fluid you drink (fluid restriction).  Take over-the-counter and prescription medicines only as told by your doctor. Contact a doctor if:  Treatment is not working.  You have heart, liver, or kidney disease and have symptoms of edema.  You have sudden and unexplained weight gain. Get help right away if:  You have shortness of breath or chest pain.  You cannot breathe when you lie down.  You have pain, redness, or warmth in the swollen areas.  You have heart, liver, or kidney disease and get edema all of a sudden.  You have a fever and your symptoms get worse all of a sudden. Summary  Edema is when you have too much fluid in your body or under your skin.  Edema may make your legs, feet, and ankles swell up. Swelling is also common in looser tissues, like around your eyes.  Raise (elevate) the swollen body part above the level of your heart when you are sitting or lying down.  Follow your doctor's instructions about diet and how much fluid you can drink (fluid restriction). This information is not intended to replace advice given to you by your health care provider. Make sure you discuss any questions you have with your health care provider. Document Released: 01/08/2008 Document Revised: 08/09/2016 Document Reviewed: 08/09/2016 Elsevier Interactive Patient Education  2017 [place] Maintenance for Postmenopausal Women Menopause is a normal process in which your reproductive ability comes to an end. This process happens gradually over a span of months to years, usually between the ages of 3 and 87. Menopause is complete when you have missed 12 consecutive menstrual periods. It is important to talk with your health care provider about some of the most common conditions that affect postmenopausal women, such as heart disease,  cancer, and bone loss (osteoporosis). Adopting a healthy lifestyle and getting preventive care can help to promote your health and wellness. Those actions can also lower your chances of developing some of these  common conditions. What should I know about menopause? During menopause, you may experience a number of symptoms, such as:  Moderate-to-severe hot flashes.  Night sweats.  Decrease in sex drive.  Mood swings.  Headaches.  Tiredness.  Irritability.  Memory problems.  Insomnia. Choosing to treat or not to treat menopausal changes is an individual decision that you make with your health care provider. What should I know about hormone replacement therapy and supplements? Hormone therapy products are effective for treating symptoms that are associated with menopause, such as hot flashes and night sweats. Hormone replacement carries certain risks, especially as you become older. If you are thinking about using estrogen or estrogen with progestin treatments, discuss the benefits and risks with your health care provider. What should I know about heart disease and stroke? Heart disease, heart attack, and stroke become more likely as you age. This may be due, in part, to the hormonal changes that your body experiences during menopause. These can affect how your body processes dietary fats, triglycerides, and cholesterol. Heart attack and stroke are both medical emergencies. There are many things that you can do to help prevent heart disease and stroke:  Have your blood pressure checked at least every 1-2 years. High blood pressure causes heart disease and increases the risk of stroke.  If you are 62-66 years old, ask your health care provider if you should take aspirin to prevent a heart attack or a stroke.  Do not use any tobacco products, including cigarettes, chewing tobacco, or electronic cigarettes. If you need help quitting, ask your health care provider.  It is important to eat  a healthy diet and maintain a healthy weight.  Be sure to include plenty of vegetables, fruits, low-fat dairy products, and lean protein.  Avoid eating foods that are high in solid fats, added sugars, or salt (sodium).  Get regular exercise. This is one of the most important things that you can do for your health.  Try to exercise for at least 150 minutes each week. The type of exercise that you do should increase your heart rate and make you sweat. This is known as moderate-intensity exercise.  Try to do strengthening exercises at least twice each week. Do these in addition to the moderate-intensity exercise.  Know your numbers.Ask your health care provider to check your cholesterol and your blood glucose. Continue to have your blood tested as directed by your health care provider. What should I know about cancer screening? There are several types of cancer. Take the following steps to reduce your risk and to catch any cancer development as early as possible. Breast Cancer  Practice breast self-awareness.  This means understanding how your breasts normally appear and feel.  It also means doing regular breast self-exams. Let your health care provider know about any changes, no matter how small.  If you are 62 or older, have a clinician do a breast exam (clinical breast exam or CBE) every year. Depending on your age, family history, and medical history, it may be recommended that you also have a yearly breast X-ray (mammogram).  If you have a family history of breast cancer, talk with your health care provider about genetic screening.  If you are at high risk for breast cancer, talk with your health care provider about having an MRI and a mammogram every year.  Breast cancer (BRCA) gene test is recommended for women who have family members with BRCA-related cancers. Results of the assessment will determine the need for genetic  counseling and BRCA1 and for BRCA2 testing. BRCA-related  cancers include these types:  Breast. This occurs in males or females.  Ovarian.  Tubal. This may also be called fallopian tube cancer.  Cancer of the abdominal or pelvic lining (peritoneal cancer).  Prostate.  Pancreatic. Cervical, Uterine, and Ovarian Cancer  Your health care provider may recommend that you be screened regularly for cancer of the pelvic organs. These include your ovaries, uterus, and vagina. This screening involves a pelvic exam, which includes checking for microscopic changes to the surface of your cervix (Pap test).  For women ages 21-65, health care providers may recommend a pelvic exam and a Pap test every three years. For women ages 44-65, they may recommend the Pap test and pelvic exam, combined with testing for human papilloma virus (HPV), every five years. Some types of HPV increase your risk of cervical cancer. Testing for HPV may also be done on women of any age who have unclear Pap test results.  Other health care providers may not recommend any screening for nonpregnant women who are considered low risk for pelvic cancer and have no symptoms. Ask your health care provider if a screening pelvic exam is right for you.  If you have had past treatment for cervical cancer or a condition that could lead to cancer, you need Pap tests and screening for cancer for at least 20 years after your treatment. If Pap tests have been discontinued for you, your risk factors (such as having a new sexual partner) need to be reassessed to determine if you should start having screenings again. Some women have medical problems that increase the chance of getting cervical cancer. In these cases, your health care provider may recommend that you have screening and Pap tests more often.  If you have a family history of uterine cancer or ovarian cancer, talk with your health care provider about genetic screening.  If you have vaginal bleeding after reaching menopause, tell your health care  provider.  There are currently no reliable tests available to screen for ovarian cancer. Lung Cancer  Lung cancer screening is recommended for adults 37-9 years old who are at high risk for lung cancer because of a history of smoking. A yearly low-dose CT scan of the lungs is recommended if you:  Currently smoke.  Have a history of at least 30 pack-years of smoking and you currently smoke or have quit within the past 15 years. A pack-year is smoking an average of one pack of cigarettes per day for one year. Yearly screening should:  Continue until it has been 15 years since you quit.  Stop if you develop a health problem that would prevent you from having lung cancer treatment. Colorectal Cancer  This type of cancer can be detected and can often be prevented.  Routine colorectal cancer screening usually begins at age 40 and continues through age 34.  If you have risk factors for colon cancer, your health care provider may recommend that you be screened at an earlier age.  If you have a family history of colorectal cancer, talk with your health care provider about genetic screening.  Your health care provider may also recommend using home test kits to check for hidden blood in your stool.  A small camera at the end of a tube can be used to examine your colon directly (sigmoidoscopy or colonoscopy). This is done to check for the earliest forms of colorectal cancer.  Direct examination of the colon should be repeated  every 5-10 years until age 26. However, if early forms of precancerous polyps or small growths are found or if you have a family history or genetic risk for colorectal cancer, you may need to be screened more often. Skin Cancer  Check your skin from head to toe regularly.  Monitor any moles. Be sure to tell your health care provider:  About any new moles or changes in moles, especially if there is a change in a mole's shape or color.  If you have a mole that is larger  than the size of a pencil eraser.  If any of your family members has a history of skin cancer, especially at a young age, talk with your health care provider about genetic screening.  Always use sunscreen. Apply sunscreen liberally and repeatedly throughout the day.  Whenever you are outside, protect yourself by wearing long sleeves, pants, a wide-brimmed hat, and sunglasses. What should I know about osteoporosis? Osteoporosis is a condition in which bone destruction happens more quickly than new bone creation. After menopause, you may be at an increased risk for osteoporosis. To help prevent osteoporosis or the bone fractures that can happen because of osteoporosis, the following is recommended:  If you are 64-30 years old, get at least 1,000 mg of calcium and at least 600 mg of vitamin D per day.  If you are older than age 50 but younger than age 75, get at least 1,200 mg of calcium and at least 600 mg of vitamin D per day.  If you are older than age 80, get at least 1,200 mg of calcium and at least 800 mg of vitamin D per day. Smoking and excessive alcohol intake increase the risk of osteoporosis. Eat foods that are rich in calcium and vitamin D, and do weight-bearing exercises several times each week as directed by your health care provider. What should I know about how menopause affects my mental health? Depression may occur at any age, but it is more common as you become older. Common symptoms of depression include:  Low or sad mood.  Changes in sleep patterns.  Changes in appetite or eating patterns.  Feeling an overall lack of motivation or enjoyment of activities that you previously enjoyed.  Frequent crying spells. Talk with your health care provider if you think that you are experiencing depression. What should I know about immunizations? It is important that you get and maintain your immunizations. These include:  Tetanus, diphtheria, and pertussis (Tdap) booster  vaccine.  Influenza every year before the flu season begins.  Pneumonia vaccine.  Shingles vaccine. Your health care provider may also recommend other immunizations. This information is not intended to replace advice given to you by your health care provider. Make sure you discuss any questions you have with your health care provider. Document Released: 09/13/2005 Document Revised: 02/09/2016 Document Reviewed: 04/25/2015 Elsevier Interactive Patient Education  2017 [place] (Tetanus and Diphtheria): What You Need to Know 1. Why get vaccinated? Tetanus  and diphtheria are very serious diseases. They are rare in the [place] today, but people who do become infected often have severe complications. Td vaccine is used to protect adolescents and adults from both of these diseases. Both tetanus and diphtheria are infections caused by bacteria. Diphtheria spreads from person to person through coughing or sneezing. Tetanus-causing bacteria enter the body through cuts, scratches, or wounds. TETANUS (lockjaw) causes painful muscle tightening and stiffness, usually all over the body.  It can lead to tightening  of muscles in the head and neck so you can't open your mouth, swallow, or sometimes even breathe. Tetanus kills about 1 out of every 10 people who are infected even after receiving the best medical care. DIPHTHERIA can cause a thick coating to form in the back of the throat.  It can lead to breathing problems, paralysis, heart failure, and death. Before vaccines, as many as 200,000 cases of diphtheria and hundreds of cases of tetanus were reported in the [place] each year. Since vaccination began, reports of cases for both diseases have dropped by about 99%. 2. Td vaccine Td vaccine can protect adolescents and adults from tetanus and diphtheria. Td is usually given as a booster dose every 10 years but it can also be given earlier after a severe and dirty wound or  burn. Another vaccine, called Tdap, which protects against pertussis in addition to tetanus and diphtheria, is sometimes recommended instead of Td vaccine. Your doctor or the person giving you the vaccine can give you more information. Td may safely be given at the same time as other vaccines. 3. Some people should not get this vaccine  A person who has ever had a life-threatening allergic reaction after a previous dose of any tetanus or diphtheria containing vaccine, OR has a severe allergy to any part of this vaccine, should not get Td vaccine. Tell the person giving the vaccine about any severe allergies.  Talk to your doctor if you:  had severe pain or swelling after any vaccine containing diphtheria or tetanus,  ever had a condition called Guillain Barre Syndrome (GBS),  aren't feeling well on the day the shot is scheduled. 4. What are the risks from Td vaccine? With any medicine, including vaccines, there is a chance of side effects. These are usually mild and go away on their own. Serious reactions are also possible but are rare. Most people who get Td vaccine do not have any problems with it. Mild problems following Td vaccine:  (Did not interfere with activities)  Pain where the shot was given (about 8 people in 10)  Redness or swelling where the shot was given (about 1 person in 4)  Mild fever (rare)  Headache (about 1 person in 4)  Tiredness (about 1 person in 4) Moderate problems following Td vaccine:  (Interfered with activities, but did not require medical attention)  Fever over 102F (rare) Severe problems following Td vaccine:  (Unable to perform usual activities; required medical attention)  Swelling, severe pain, bleeding and/or redness in the arm where the shot was given (rare). Problems that could happen after any vaccine:   People sometimes faint after a medical procedure, including vaccination. Sitting or lying down for about 15 minutes can help prevent  fainting, and injuries caused by a fall. Tell your doctor if you feel dizzy, or have vision changes or ringing in the ears.  Some people get severe pain in the shoulder and have difficulty moving the arm where a shot was given. This happens very rarely.  Any medication can cause a severe allergic reaction. Such reactions from a vaccine are very rare, estimated at fewer than 1 in a million doses, and would happen within a few minutes to a few hours after the vaccination. As with any medicine, there is a very remote chance of a vaccine causing a serious injury or death. The safety of vaccines is always being monitored. For more information, visit: http://www.aguilar.org/ 5. What if there is a serious reaction? What should  I look for?  Look for anything that concerns you, such as signs of a severe allergic reaction, very high fever, or unusual behavior. Signs of a severe allergic reaction can include hives, swelling of the face and throat, difficulty breathing, a fast heartbeat, dizziness, and weakness. These would usually start a few minutes to a few hours after the vaccination. What should I do?   If you think it is a severe allergic reaction or other emergency that can't wait, call 9-1-1 or get the person to the nearest hospital. Otherwise, call your doctor.  Afterward, the reaction should be reported to the Vaccine Adverse Event Reporting System (VAERS). Your doctor might file this report, or you can do it yourself through the VAERS web site at www.vaers.SamedayNews.es, or by calling (604) 421-9673.  VAERS does not give medical advice. 6. The National Vaccine Injury Compensation Program The [employer] Vaccine Injury Compensation Program (VICP) is a federal program that was created to compensate people who may have been injured by certain vaccines. Persons who believe they may have been injured by a vaccine can learn about the program and about filing a claim by calling 214-883-5481 or visiting the  [place] website at GoldCloset.com.ee. There is a time limit to file a claim for compensation. 7. How can I learn more?  Ask your doctor. He or she can give you the vaccine package insert or suggest other sources of information.  Call your local or state health department.  Contact the Centers for Disease Control and Prevention (CDC):  Call 628-783-6448 (1-800-CDC-INFO)  Visit CDC's website at http://hunter.com/ CDC Td Vaccine VIS (11/14/15) This information is not intended to replace advice given to you by your health care provider. Make sure you discuss any questions you have with your health care provider. Document Released: 05/19/2006 Document Revised: 04/11/2016 Document Reviewed: 04/11/2016 Elsevier Interactive Patient Education  2017 [place]. Pneumococcal Polysaccharide Vaccine: What You Need to Know 1. Why get vaccinated? Vaccination can protect older adults (and some children and younger adults) from pneumococcal disease. Pneumococcal disease is caused by bacteria that can spread from person to person through close contact. It can cause ear infections, and it can also lead to more serious infections of the:  Lungs (pneumonia),  Blood (bacteremia), and  Covering of the brain and spinal cord (meningitis). Meningitis can cause deafness and brain damage, and it can be fatal. Anyone can get pneumococcal disease, but children under 86 years of age, people with certain medical conditions, adults over 71 years of age, and cigarette smokers are at the highest risk. About 18,000 older adults die each year from pneumococcal disease in the [place]. Treatment of pneumococcal infections with penicillin and other drugs used to be more effective. But some strains of the disease have become resistant to these drugs. This makes prevention of the disease, through vaccination, even more important. 2. Pneumococcal polysaccharide vaccine (PPSV23) Pneumococcal polysaccharide  vaccine (PPSV23) protects against 23 types of pneumococcal bacteria. It will not prevent all pneumococcal disease. PPSV23 is recommended for:  All adults 3 years of age and older,  Anyone 2 through 63 years of age with certain long-term health problems,  Anyone 2 through 63 years of age with a weakened immune system,  Adults 58 through 63 years of age who smoke cigarettes or have asthma. Most people need only one dose of PPSV. A second dose is recommended for certain high-risk groups. People 34 and older should get a dose even if they have gotten one or more doses  of the vaccine before they turned 31. Your healthcare provider can give you more information about these recommendations. Most healthy adults develop protection within 2 to 3 weeks of getting the shot. 3. Some people should not get this vaccine  Anyone who has had a life-threatening allergic reaction to PPSV should not get another dose.  Anyone who has a severe allergy to any component of PPSV should not receive it. Tell your provider if you have any severe allergies.  Anyone who is moderately or severely ill when the shot is scheduled may be asked to wait until they recover before getting the vaccine. Someone with a mild illness can usually be vaccinated.  Children less than 64 years of age should not receive this vaccine.  There is no evidence that PPSV is harmful to either a pregnant woman or to her fetus. However, as a precaution, women who need the vaccine should be vaccinated before becoming pregnant, if possible. 4. Risks of a vaccine reaction With any medicine, including vaccines, there is a chance of side effects. These are usually mild and go away on their own, but serious reactions are also possible. About half of people who get PPSV have mild side effects, such as redness or pain where the shot is given, which go away within about two days. Less than 1 out of 100 people develop a fever, muscle aches, or more severe  local reactions. Problems that could happen after any vaccine:   People sometimes faint after a medical procedure, including vaccination. Sitting or lying down for about 15 minutes can help prevent fainting, and injuries caused by a fall. Tell your doctor if you feel dizzy, or have vision changes or ringing in the ears.  Some people get severe pain in the shoulder and have difficulty moving the arm where a shot was given. This happens very rarely.  Any medication can cause a severe allergic reaction. Such reactions from a vaccine are very rare, estimated at about 1 in a million doses, and would happen within a few minutes to a few hours after the vaccination. As with any medicine, there is a very remote chance of a vaccine causing a serious injury or death. The safety of vaccines is always being monitored. For more information, visit: http://www.aguilar.org/ 5. What if there is a serious reaction? What should I look for?  Look for anything that concerns you, such as signs of a severe allergic reaction, very high fever, or unusual behavior. Signs of a severe allergic reaction can include hives, swelling of the face and throat, difficulty breathing, a fast heartbeat, dizziness, and weakness. These would usually start a few minutes to a few hours after the vaccination. What should I do?  If you think it is a severe allergic reaction or other emergency that can't wait, call 9-1-1 or get to the nearest hospital. Otherwise, call your doctor. Afterward, the reaction should be reported to the Vaccine Adverse Event Reporting System (VAERS). Your doctor might file this report, or you can do it yourself through the VAERS web site at www.vaers.SamedayNews.es, or by calling (512)389-7168. VAERS does not give medical advice.  6. How can I learn more?  Ask your doctor. He or she can give you the vaccine package insert or suggest other sources of information.  Call your local or state health  department.  Contact the Centers for Disease Control and Prevention (CDC):  Call (250)358-9962 (1-800-CDC-INFO) or  Visit CDC's website at http://hunter.com/ CDC Pneumococcal Polysaccharide Vaccine VIS (11/26/13) This  information is not intended to replace advice given to you by your health care provider. Make sure you discuss any questions you have with your health care provider. Document Released: 05/19/2006 Document Revised: 04/11/2016 Document Reviewed: 04/11/2016 Elsevier Interactive Patient Education  2017 [employer].

## 2016-11-19 NOTE — Progress Notes (Addendum)
Raven Torres, is a 61 y.o. female  NKN:[id number]  FXT:[id number]  DOB - Nov 22, 1955  Chief Complaint  Patient presents with  . Hypertension        Subjective:   Raven Torres is a 61 y.o. 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  Of note, her husband just passed away about 6 wks ago, hospice from cancer. And since than, she has been crying and more tearful. Very stressed and trying to adjust to the changes. Smoking more due to stress and not sleeping well at night. Denies si/hi/avh, but amendable to starting something for short time. She declined referral to psychiatry, but noted that if she does not get better she will call us to get her referred.   She use to see Rheum for all her joint pains, but her prior Rheum Dr Charlestine Night does not take Medicaid. She has had prior injections as well. c/o of more left knee pain more, worse after standing for long periods, w/ occasional swelling of knee as well. Denies any trauma.  Patient has No headache, No chest pain, No abdominal pain - No Nausea, No new weakness tingling or numbness, No Cough - SOB.  No problems updated.  ALLERGIES: Allergies  Allergen Reactions  . Amitriptyline Other (See Comments)    Eye spasms per pt  . Codeine Nausea Only  . Tramadol Nausea Only    PAST MEDICAL HISTORY: Past Medical History:  Diagnosis Date  . Arthritis   . Colitis   . Hepatitis C     MEDICATIONS AT HOME: Prior to Admission medications   Medication Sig Start Date End Date Taking? Authorizing Provider  acetaminophen (TYLENOL) 500 MG tablet Take 500-1,000 mg by mouth daily.    Historical Provider, MD  albuterol (PROAIR HFA) 108 (90 Base) MCG/ACT inhaler INHALE 2 PUFFS INTO THE LUNGS EVERY 6 HOURS AS NEEDED FOR WHEEZING OR SHORTNESS OF BREATH 10/01/16   Maren Reamer, MD  atorvastatin (LIPITOR) 20  MG tablet Take 1 tablet (20 mg total) by mouth daily. 10/01/16   Maren Reamer, MD  cyclobenzaprine (FLEXERIL) 10 MG tablet Take 1 tablet (10 mg total) by mouth 3 (three) times daily as needed for muscle spasms. 11/19/16   Maren Reamer, MD  diclofenac sodium (VOLTAREN) 1 % GEL Apply 2 g topically 4 (four) times daily. 11/19/16   Maren Reamer, MD  escitalopram (LEXAPRO) 10 MG tablet Take 1 tablet (10 mg total) by mouth daily. 11/19/16   Maren Reamer, MD  gabapentin (NEURONTIN) 300 MG capsule Take 1 capsule (300 mg total) by mouth 3 (three) times daily. 11/19/16   Maren Reamer, MD  lisinopril-hydrochlorothiazide (PRINZIDE,ZESTORETIC) 10-12.5 MG tablet Take 1 tablet by mouth daily. 11/19/16   Maren Reamer, MD  metroNIDAZOLE (FLAGYL) 500 MG tablet Take 1 tablet (500 mg total) by mouth 2 (two) times daily. Patient not taking: Reported on 11/19/2016 02/26/16   Maren Reamer, MD  NICOTINE STEP 1 21 MG/24HR patch PLACE 1 [place] DAY Patient not taking: Reported on 11/19/2016 02/27/16   Maren Reamer, MD  nystatin cream (MYCOSTATIN) Apply 1 application topically 2 (two) times daily. Patient not taking: Reported on 11/19/2016 07/19/15   Lance Bosch, NP     Objective:   Vitals:   11/19/16 1120  BP: (!) 187/92  Pulse: 75  Resp: 16  Temp: 97.4 F (36.3 C)  TempSrc: Oral  SpO2: 93%  Weight: 193 lb 6.4 oz (87.7 kg)    Exam General appearance : Awake, alert, not in any distress. Speech Clear. Not toxic looking, obese, tired appearing, but pleasant.  HEENT: Atraumatic and Normocephalic, pupils equally reactive to light. Neck: supple, no JVD. Chest:Good air entry bilaterally, no added sounds. CVS: S1 S2 regular, no murmurs/gallups or rubs. Abdomen: Bowel sounds active, Non tender, obese and not distended with no gaurding, rigidity or rebound. Extremities: mild ttp medial aspect of left knee w/ mild effusion, not warm to touch.  rom intact, but gait  slow/limited due to pains. B/L Lower Ext shows no edema, both legs are warm to touch Neurology: Awake alert, and oriented X 3, CN II-XII grossly intact, Non focal Skin:No Rash  Data Review Lab Results  Component Value Date   HGBA1C 5.2 11/19/2016    Depression screen [facility] 2/9 11/19/2016 02/26/2016 02/26/2016 01/30/2016 10/25/2015  Decreased Interest 1 1 1 1 1   Down, Depressed, Hopeless 1 0 0 1 0  PHQ - 2 Score 2 1 1 2 1   Altered sleeping 2 - - 1 -  Tired, decreased energy 1 - - 1 -  Change in appetite 2 - - 0 -  Feeling bad or failure about yourself  0 - - 1 -  Trouble concentrating 0 - - 1 -  Moving slowly or fidgety/restless 0 - - 1 -  Suicidal thoughts 0 - - 0 -  PHQ-9 Score 7 - - 7 -  Difficult doing work/chores - - - Somewhat difficult -    Liver US 07/08/16 IMPRESSION: Subtle surface nodularity of the liver with coarsening of the liver parenchyma above without discrete mass. Findings are in keeping with cirrhosis. No ascites.  Minimal biliary sludge without findings of acute cholecystitis.   Electronically Signed   By: Ashley Royalty M.D.   On: 07/08/2016 13:53  Outside records Pt brought in//colonoscopy 11/17/2008 - recd repeat surveillance 5 years. Done at [facility], 53mm polyp, possible congested mucosa. - path - hyperplastic polyp. Assessment & Plan   1. Essential hypertension - uncontrolled, likely worse w/ stress/smoking - low salt diet discussed - Basic metabolic panel - Lipid Panel - CBC with Differential - dc lisinopril 10 and start prinzide 10-12.5 qd - fu RN 2 wks/Travia for bp check, sbp >130, increase prinzide to 20-25 qd  2. Adjustment disorder with depressed mood From husband's recent death. - low dose lexapro 10 qd started - dw pt coping mechanisms - fu in 1 month, if not better, psyche eval, pt wants to hold off for now.   3. Colon cancer screening + cirrhosis - Hx of colonic polpy on cscope 4/10, recd 5 year survellance; and  - Ambulatory referral  to Gastroenterology  4. Left knee pain, unspecified chronicity - Ambulatory referral to Physical Medicine Rehab - DG Knee Complete 4 Views Left; Future - prn flexeril, voltarin gel renewed, rice for now.  5. Arthritis /RA - Ambulatory referral to Physical Medicine Rehab - Ambulatory referral to Rheumatology  6. Diabetes mellitus screening - POCT glycosylated hemoglobin (Hb A1C)  7. Other cirrhosis of liver (HCC) /hep c Per ID, treated w/ 12 wks Harvoni.  8. Smoking Pneumococcal 23v today.  Debara was counseled on the dangers of tobacco use, and was advised to quit. Reviewed strategies to maximize success, including removing cigarettes and smoking materials from environment, stress management and support of family/friends.  9. Insomnia, unspecified type Likely related w/ adjustment d/o, follow  10. Dysuria, possible - POCT urinalysis dipstick - neg ua  11. Encounter for vitamin deficiency screening - postmenopausal, advised Calcium 1200mg /daily and vit d as well - vit d dose pending lab - VITAMIN D 25 Hydroxy (Vit-D Deficiency, Fractures)   13. Fibromyalgia - failed elavil trial in past, said it cause her vision to go "weird" and off. - cyclobenzaprine (FLEXERIL) 10 MG tablet; Take 1 tablet (10 mg total) by mouth 3 (three) times daily as needed for muscle spasms.  Dispense: 30 tablet; Refill: 3  14. bilat le trace edema - may improve w/ addition of hctz 12.5 - rx compression stockings provided as well - info on edema noted, keep legs elevated at night, wear compression stockings during day.  Patient have been counseled extensively about nutrition and exercise  Return in about 4 weeks (around 12/17/2016) for anxiety .  The patient was given clear instructions to go to ER or return to medical center if symptoms don't improve, worsen or new problems develop. The patient verbalized understanding. The patient was told to call to get lab results if they haven't heard anything in  the next week.   This note has been created with Surveyor, quantity. Any transcriptional errors are unintentional.   Maren Reamer, MD, [place] and [facility] [place], [place]   11/19/2016, 1:09 PM

## 2016-11-19 NOTE — Telephone Encounter (Signed)
Called pt, confirmed dob.  Reviewed left knee xray w/ pt, negative. Suspect overuse arthritis. Use her knee brace, rice as needed, volt gel. May benefit w/ knee injection again if persist.

## 2016-11-20 ENCOUNTER — Other Ambulatory Visit: Payer: Self-pay | Admitting: Internal Medicine

## 2016-11-20 LAB — BASIC METABOLIC PANEL
BUN/Creatinine Ratio: 9 — ABNORMAL LOW (ref 12–28)
BUN: 7 mg/dL — ABNORMAL LOW (ref 8–27)
CHLORIDE: 98 mmol/L (ref 96–106)
CO2: 26 mmol/L (ref 18–29)
Calcium: 10.1 mg/dL (ref 8.7–10.3)
Creatinine, Ser: 0.81 mg/dL (ref 0.57–1.00)
GFR calc Af Amer: 89 mL/min/{1.73_m2} (ref >59)
GFR, EST NON AFRICAN AMERICAN: 78 mL/min/{1.73_m2} (ref >59)
Glucose: 103 mg/dL — ABNORMAL HIGH (ref 65–99)
POTASSIUM: 4.9 mmol/L (ref 3.5–5.2)
SODIUM: 138 mmol/L (ref 134–144)

## 2016-11-20 LAB — LIPID PANEL
CHOL/HDL RATIO: 2.7 ratio (ref 0.0–4.4)
Cholesterol, Total: 184 mg/dL (ref 100–199)
HDL: 69 mg/dL (ref >39)
LDL Calculated: 82 mg/dL (ref 0–99)
TRIGLYCERIDES: 165 mg/dL — AB (ref 0–149)
VLDL Cholesterol Cal: 33 mg/dL (ref 5–40)

## 2016-11-20 LAB — CBC WITH DIFFERENTIAL/PLATELET
Basophils Absolute: 0 10*3/uL (ref 0.0–0.2)
Basos: 0 %
EOS (ABSOLUTE): 0.2 10*3/uL (ref 0.0–0.4)
EOS: 2 %
HEMATOCRIT: 51.1 % — AB (ref 34.0–46.6)
HEMOGLOBIN: 17.5 g/dL — AB (ref 11.1–15.9)
IMMATURE GRANS (ABS): 0 10*3/uL (ref 0.0–0.1)
IMMATURE GRANULOCYTES: 0 %
LYMPHS: 36 %
Lymphocytes Absolute: 2.9 10*3/uL (ref 0.7–3.1)
MCH: 30.4 pg (ref 26.6–33.0)
MCHC: 34.2 g/dL (ref 31.5–35.7)
MCV: 89 fL (ref 79–97)
MONOCYTES: 8 %
Monocytes Absolute: 0.7 10*3/uL (ref 0.1–0.9)
Neutrophils Absolute: 4.4 10*3/uL (ref 1.4–7.0)
Neutrophils: 54 %
Platelets: 174 10*3/uL (ref 150–379)
RBC: 5.76 x10E6/uL — AB (ref 3.77–5.28)
RDW: 14.4 % (ref 12.3–15.4)
WBC: 8.1 10*3/uL (ref 3.4–10.8)

## 2016-11-20 LAB — VITAMIN D 25 HYDROXY (VIT D DEFICIENCY, FRACTURES): VIT D 25 HYDROXY: 18.6 ng/mL — AB (ref 30.0–100.0)

## 2016-11-20 MED ORDER — ASPIRIN EC 81 MG PO TBEC: 81.0000 mg | DELAYED_RELEASE_TABLET | Freq: Every day | ORAL | 3 refills | Status: DC

## 2016-11-20 MED ORDER — VITAMIN D (ERGOCALCIFEROL) 1.25 MG (50000 UNIT) PO CAPS: 50000.0000 [IU] | ORAL_CAPSULE | ORAL | 0 refills | Status: DC

## 2016-11-22 ENCOUNTER — Telehealth: Payer: Self-pay

## 2016-11-22 NOTE — Telephone Encounter (Signed)
Contacted pt to go over lab results pt didn't answer lvm asking pt to give me a call at her earliest convenience  

## 2016-12-02 NOTE — Telephone Encounter (Signed)
Dr.Pyrtle reviewed records and decided that patient wasn't due for next colon until April 2020. Patient states no family hx of colon cancer and no problems at the time, so recall will be put in system. Patient notified to call if having any gi problems before then.

## 2016-12-05 ENCOUNTER — Ambulatory Visit: Payer: Medicaid Other | Attending: Internal Medicine | Admitting: *Deleted

## 2016-12-05 VITALS — BP 128/64 | HR 61 | Resp 16

## 2016-12-05 DIAGNOSIS — I10 Essential (primary) hypertension: Secondary | ICD-10-CM

## 2016-12-05 NOTE — Progress Notes (Signed)
Pt here for blood pressure check Pt denies chest pain, SOB, HA, new vison concerns. She states swelling in her legs is improving. No pitting edema assessed to BLE. Encourage to elevate legs to reduce swelling.   Verified medications with Raven Torres. She verifies she is taking medications as prescribed. Blood pressure taken manually while patient is sitting:  BP: 128/60 and 128/64.

## 2016-12-14 ENCOUNTER — Other Ambulatory Visit: Payer: Self-pay | Admitting: Internal Medicine

## 2016-12-14 DIAGNOSIS — E7800 Pure hypercholesterolemia, unspecified: Secondary | ICD-10-CM

## 2016-12-16 ENCOUNTER — Other Ambulatory Visit: Payer: Self-pay | Admitting: Internal Medicine

## 2016-12-17 ENCOUNTER — Encounter: Payer: Self-pay | Admitting: Internal Medicine

## 2016-12-17 ENCOUNTER — Ambulatory Visit: Payer: Medicaid Other | Attending: Internal Medicine | Admitting: Internal Medicine

## 2016-12-17 VITALS — BP 127/71 | HR 78 | Temp 97.4°F | Resp 16 | Wt 190.6 lb

## 2016-12-17 DIAGNOSIS — Z7982 Long term (current) use of aspirin: Secondary | ICD-10-CM | POA: Insufficient documentation

## 2016-12-17 DIAGNOSIS — Z1231 Encounter for screening mammogram for malignant neoplasm of breast: Secondary | ICD-10-CM

## 2016-12-17 DIAGNOSIS — B182 Chronic viral hepatitis C: Secondary | ICD-10-CM | POA: Diagnosis present

## 2016-12-17 DIAGNOSIS — I10 Essential (primary) hypertension: Secondary | ICD-10-CM | POA: Diagnosis present

## 2016-12-17 DIAGNOSIS — Z1239 Encounter for other screening for malignant neoplasm of breast: Secondary | ICD-10-CM

## 2016-12-17 DIAGNOSIS — F4321 Adjustment disorder with depressed mood: Secondary | ICD-10-CM

## 2016-12-17 DIAGNOSIS — E7800 Pure hypercholesterolemia, unspecified: Secondary | ICD-10-CM | POA: Diagnosis not present

## 2016-12-17 MED ORDER — LISINOPRIL-HYDROCHLOROTHIAZIDE 10-12.5 MG PO TABS: 1.0000 | ORAL_TABLET | Freq: Every day | ORAL | 3 refills | Status: DC

## 2016-12-17 MED ORDER — ASPIRIN EC 81 MG PO TBEC: 81.0000 mg | DELAYED_RELEASE_TABLET | Freq: Every day | ORAL | 3 refills | Status: DC

## 2016-12-17 MED ORDER — ESCITALOPRAM OXALATE 20 MG PO TABS
20.0000 mg | ORAL_TABLET | Freq: Every day | ORAL | 2 refills | Status: DC
Start: 2016-12-17 — End: 2017-04-14

## 2016-12-17 MED ORDER — ATORVASTATIN CALCIUM 20 MG PO TABS: ORAL_TABLET | ORAL | 3 refills | Status: DC

## 2016-12-17 NOTE — Progress Notes (Signed)
Raven Torres, is a 61 y.o. female  INO:[id number]  [id number]:[id number]  DOB - 1955/08/22  Chief Complaint  Patient presents with  . Follow-up        Subjective:   Raven Torres is a 61 y.o. female here today for a follow up visit for adjustment do, htn, hx of hep c cirrhosis, treated w/ Harvoni per DR Novella Olive ID.  Of note, since starting lexapro, she is feeling better, and sleeping much better. Still has some crying spells, esp during mother's day when her husband was not around. He usually bought her flowers.  Denies si/hi/avh.  Her bp is much better on current regimen.    Patient has No headache, No chest pain, No abdominal pain - No Nausea, No new weakness tingling or numbness, No Cough - SOB.  No problems updated.  ALLERGIES: Allergies  Allergen Reactions  . Amitriptyline Other (See Comments)    Eye spasms per pt  . Codeine Nausea Only  . Tramadol Nausea Only    PAST MEDICAL HISTORY: Past Medical History:  Diagnosis Date  . Arthritis   . Colitis   . Hepatitis C     MEDICATIONS AT HOME: Prior to Admission medications   Medication Sig Start Date End Date Taking? Authorizing Provider  acetaminophen (TYLENOL) 500 MG tablet Take 500-1,000 mg by mouth daily.    [provider]  albuterol (PROAIR HFA) 108 (90 Base) MCG/ACT inhaler INHALE 2 PUFFS INTO THE LUNGS EVERY 6 HOURS AS NEEDED FOR WHEEZING OR SHORTNESS OF BREATH 10/01/16   Maren Reamer, MD  aspirin EC 81 MG tablet Take 1 tablet (81 mg total) by mouth daily. 12/17/16   Maritza Goldsborough, Leda Quail, MD  atorvastatin (LIPITOR) 20 MG tablet TAKE 1 TABLET(20 MG) BY MOUTH DAILY 12/17/16   Lottie Mussel T, MD  cyclobenzaprine (FLEXERIL) 10 MG tablet Take 1 tablet (10 mg total) by mouth 3 (three) times daily as needed for muscle spasms. 11/19/16   Maren Reamer, MD  diclofenac sodium (VOLTAREN) 1 % GEL Apply 2 g topically 4 (four) times daily. 11/19/16   Emonee Winkowski, Leda Quail, MD  escitalopram (LEXAPRO) 20 MG tablet  Take 1 tablet (20 mg total) by mouth at bedtime. 1/2 tab daily, may take full dose if bad day. 12/17/16   Maren Reamer, MD  gabapentin (NEURONTIN) 300 MG capsule Take 1 capsule (300 mg total) by mouth 3 (three) times daily. 11/19/16   Maren Reamer, MD  lisinopril-hydrochlorothiazide (PRINZIDE,ZESTORETIC) 10-12.5 MG tablet Take 1 tablet by mouth daily. 12/17/16   Maren Reamer, MD  metroNIDAZOLE (FLAGYL) 500 MG tablet Take 1 tablet (500 mg total) by mouth 2 (two) times daily. Patient not taking: Reported on 11/19/2016 02/26/16   Maren Reamer, MD  NICOTINE STEP 1 21 MG/24HR patch PLACE 1 [place] DAY Patient not taking: Reported on 11/19/2016 02/27/16   Maren Reamer, MD  nystatin cream (MYCOSTATIN) Apply 1 application topically 2 (two) times daily. Patient not taking: Reported on 11/19/2016 07/19/15   Lance Bosch, NP  Vitamin D, Ergocalciferol, (DRISDOL) 50000 units CAPS capsule Take 1 capsule (50,000 Units total) by mouth every 7 (seven) days. 11/20/16   Maren Reamer, MD     Objective:   Vitals:   12/17/16 0840  BP: 127/71  Pulse: 78  Resp: 16  Temp: 97.4 F (36.3 C)  TempSrc: Oral  SpO2: 95%  Weight: 190 lb 9.6 oz (86.5 kg)    Exam General appearance :  Awake, alert, not in any distress. Speech Clear. Not toxic looking, pleasant, obese. Good spirits. HEENT: Atraumatic and Normocephalic, pupils equally reactive to light. Neck: supple, no JVD.  Chest:Good air entry bilaterally, no added sounds. CVS: S1 S2 regular, no murmurs/gallups or rubs. Abdomen: Bowel sounds active, Non tender and not distended with no gaurding, rigidity or rebound. No ascitic fluid wave. Extremities: B/L Lower Ext shows no edema, both legs are warm to touch Neurology: Awake alert, and oriented X 3, CN II-XII grossly intact, Non focal Skin:No Rash  Data Review Lab Results  Component Value Date   HGBA1C 5.2 11/19/2016    Depression screen [facility] 2/9 12/17/2016  11/19/2016 02/26/2016 02/26/2016 01/30/2016  Decreased Interest 1 1 1 1 1   Down, Depressed, Hopeless 0 1 0 0 1  PHQ - 2 Score 1 2 1 1 2   Altered sleeping 0 2 - - 1  Tired, decreased energy 0 1 - - 1  Change in appetite 1 2 - - 0  Feeling bad or failure about yourself  0 0 - - 1  Trouble concentrating 0 0 - - 1  Moving slowly or fidgety/restless 0 0 - - 1  Suicidal thoughts 0 0 - - 0  PHQ-9 Score 2 7 - - 7  Difficult doing work/chores - - - - Somewhat difficult      Assessment & Plan   1. Adjustment disorder with depressed mood Due to husband's death.  Doing better on lexapro 10. Will increase to 20mg  tabs. Pt may try full dose to see if helps better w/ mood, sleep is better now. If not much difference, can back down to 1/2 tab and take full tab for "bad" days.  2. Chronic hepatitis C without hepatic coma (HCC) - per ID, due fo 23month f/u w/ ID, encouraged pt to call Dr Henreitta Leber office for fu appt . - US Abdomen Complete; Future - hcc screening  3. Essential hypertension Much better, low salt diet encouraged. Continue prinzide 10-12.5 qd  4. High cholesterol - continue asa 81 qd - atorvastatin (LIPITOR) 20 MG tablet; TAKE 1 TABLET(20 MG) BY MOUTH DAILY  Dispense: 90 tablet; Refill: 3  5. Breast cancer screening - MM Digital Screening; Future  - due 03/2017  6. Colon cancer screening Chart reviewed by Dr Hilarie Fredrickson, pt not due for re-eval til April 2020.     Patient have been counseled extensively about nutrition and exercise  Return in about 3 months (around 03/19/2017), or if symptoms worsen or fail to improve.  The patient was given clear instructions to go to ER or return to medical center if symptoms don't improve, worsen or new problems develop. The patient verbalized understanding. The patient was told to call to get lab results if they haven't heard anything in the next week.   This note has been created with Surveyor, quantity.  Any transcriptional errors are unintentional.   Maren Reamer, MD, [place] [facility] [place], [place]   12/17/2016, 8:58 AM

## 2016-12-17 NOTE — Patient Instructions (Addendum)
Vit D 3  10,000 IU daily x 3 months, than go down to 5,000 IU daily. Calcium 1200mg  /daily  - Can take full dose of lexapro 20mg  if having rough /bad day. If all ok, than take 1/ 2 tab = 10mg  day.  - pending referral to Pain Management - may take up to 4-6 wks for referral to be reviewed.  -call Dr Comer's office for 44month f/u.  -   Adjustment Disorder Adjustment disorder is an unusually severe reaction to a stressful life event, such as the loss of a job or physical illness. The event may be any stressful event other than the loss of a loved one. Adjustment disorder may affect your feelings, your thinking, how you act, or a combination of these. It may interfere with personal relationships or with the way you are at work, school, or home. People with this disorder are at risk for suicide and substance abuse. They may develop a more serious mental disorder, such as major depressive disorder or post-traumatic stress disorder. SIGNS AND SYMPTOMS  Symptoms may include:  Sadness, depressed mood, or crying spells.  Loss of enjoyment.  Change in appetite or weight.  Sense of loss or hopelessness.  Thoughts of suicide.  Anxiety, worry, or nervousness.  Trouble sleeping.  Avoiding family and friends.  Poor school performance.  Fighting or vandalism.  Reckless driving.  Skipping school.  Poor work Systems analyst.  Ignoring bills. Symptoms of adjustment disorder start within 3 months of the stressful life event. They do not last more than 6 months after the event has ended. DIAGNOSIS  To make a diagnosis, your health care provider will ask about what has happened in your life and how it has affected you. He or she may also ask about your medical history and use of medicines, alcohol, and other substances. Your health care provider may do a physical exam and order lab tests or other studies. You may be referred to a mental health specialist for evaluation. TREATMENT  Treatment  options include:  Counseling or talk therapy. Talk therapy is usually provided by mental health specialists.  Medicine. Certain medicines may help with depression, anxiety, and sleep.  Support groups. Support groups offer emotional support, advice, and guidance. They are made up of people who have had similar experiences. HOME CARE INSTRUCTIONS  Keep all follow-up visits as directed by your health care provider. This is important.  Take medicines only as directed by your health care provider. SEEK MEDICAL CARE IF:  Your symptoms get worse.  SEEK IMMEDIATE MEDICAL CARE IF: You have serious thoughts about hurting yourself or someone else. MAKE SURE YOU:  Understand these instructions.  Will watch your condition.  Will get help right away if you are not doing well or get worse. This information is not intended to replace advice given to you by your health care provider. Make sure you discuss any questions you have with your health care provider. Document Released: 03/26/2006 Document Revised: 11/13/2015 Document Reviewed: 12/14/2013 Elsevier Interactive Patient Education  2017 Elsevier Inc. --   Low-Sodium Eating Plan Sodium, which is an element that makes up salt, helps you maintain a healthy balance of fluids in your body. Too much sodium can increase your blood pressure and cause fluid and waste to be held in your body. Your health care provider or dietitian may recommend following this plan if you have high blood pressure (hypertension), kidney disease, liver disease, or heart failure. Eating less sodium can help lower your blood pressure,  reduce swelling, and protect your heart, liver, and kidneys. What are tips for following this plan? General guidelines   Most people on this plan should limit their sodium intake to 1,500-2,000 mg (milligrams) of sodium each day. Reading food labels   The Nutrition Facts label lists the amount of sodium in one serving of the food. If you eat  more than one serving, you must multiply the listed amount of sodium by the number of servings.  Choose foods with less than 140 mg of sodium per serving.  Avoid foods with 300 mg of sodium or more per serving. Shopping   Look for lower-sodium products, often labeled as "low-sodium" or "no salt added."  Always check the sodium content even if foods are labeled as "unsalted" or "no salt added".  Buy fresh foods.  Avoid canned foods and premade or frozen meals.  Avoid canned, cured, or processed meats  Buy breads that have less than 80 mg of sodium per slice. Cooking   Eat more home-cooked food and less restaurant, buffet, and fast food.  Avoid adding salt when cooking. Use salt-free seasonings or herbs instead of table salt or sea salt. Check with your health care provider or pharmacist before using salt substitutes.  Cook with plant-based oils, such as canola, sunflower, or olive oil. Meal planning   When eating at a restaurant, ask that your food be prepared with less salt or no salt, if possible.  Avoid foods that contain MSG (monosodium glutamate). MSG is sometimes added to [place] food, bouillon, and some canned foods. What foods are recommended? The items listed may not be a complete list. Talk with your dietitian about what dietary choices are best for you. Grains  Low-sodium cereals, including oats, puffed wheat and rice, and shredded wheat. Low-sodium crackers. Unsalted rice. Unsalted pasta. Low-sodium bread. Whole-grain breads and whole-grain pasta. Vegetables  Fresh or frozen vegetables. "No salt added" canned vegetables. "No salt added" tomato sauce and paste. Low-sodium or reduced-sodium tomato and vegetable juice. Fruits  Fresh, frozen, or canned fruit. Fruit juice. Meats and other protein foods  Fresh or frozen (no salt added) meat, poultry, seafood, and fish. Low-sodium canned tuna and salmon. Unsalted nuts. Dried peas, beans, and lentils without added salt.  Unsalted canned beans. Eggs. Unsalted nut butters. Dairy  Milk. Soy milk. Cheese that is naturally low in sodium, such as ricotta cheese, fresh mozzarella, or Swiss cheese Low-sodium or reduced-sodium cheese. Cream cheese. Yogurt. Fats and oils  Unsalted butter. Unsalted margarine with no trans fat. Vegetable oils such as canola or olive oils. Seasonings and other foods  Fresh and dried herbs and spices. Salt-free seasonings. Low-sodium mustard and ketchup. Sodium-free salad dressing. Sodium-free light mayonnaise. Fresh or refrigerated horseradish. Lemon juice. Vinegar. Homemade, reduced-sodium, or low-sodium soups. Unsalted popcorn and pretzels. Low-salt or salt-free chips. What foods are not recommended? The items listed may not be a complete list. Talk with your dietitian about what dietary choices are best for you. Grains  Instant hot cereals. Bread stuffing, pancake, and biscuit mixes. Croutons. Seasoned rice or pasta mixes. Noodle soup cups. Boxed or frozen macaroni and cheese. Regular salted crackers. Self-rising flour. Vegetables  Sauerkraut, pickled vegetables, and relishes. Olives. [place] fries. Onion rings. Regular canned vegetables (not low-sodium or reduced-sodium). Regular canned tomato sauce and paste (not low-sodium or reduced-sodium). Regular tomato and vegetable juice (not low-sodium or reduced-sodium). Frozen vegetables in sauces. Meats and other protein foods  Meat or fish that is salted, canned, smoked, spiced, or pickled. Berniece Salines,  ham, sausage, hotdogs, corned beef, chipped beef, packaged lunch meats, salt pork, jerky, pickled herring, anchovies, regular canned tuna, sardines, salted nuts. Dairy  Processed cheese and cheese spreads. Cheese curds. Blue cheese. Feta cheese. String cheese. Regular cottage cheese. Buttermilk. Canned milk. Fats and oils  Salted butter. Regular margarine. Ghee. Bacon fat. Seasonings and other foods  Onion salt, garlic salt, seasoned salt, table  salt, and sea salt. Canned and packaged gravies. Worcestershire sauce. Tartar sauce. Barbecue sauce. Teriyaki sauce. Soy sauce, including reduced-sodium. Steak sauce. Fish sauce. Oyster sauce. Cocktail sauce. Horseradish that you find on the shelf. Regular ketchup and mustard. Meat flavorings and tenderizers. Bouillon cubes. Hot sauce and Tabasco sauce. Premade or packaged marinades. Premade or packaged taco seasonings. Relishes. Regular salad dressings. Salsa. Potato and tortilla chips. Corn chips and puffs. Salted popcorn and pretzels. Canned or dried soups. Pizza. Frozen entrees and pot pies. Summary  Eating less sodium can help lower your blood pressure, reduce swelling, and protect your heart, liver, and kidneys.  Most people on this plan should limit their sodium intake to 1,500-2,000 mg (milligrams) of sodium each day.  Canned, boxed, and frozen foods are high in sodium. Restaurant foods, fast foods, and pizza are also very high in sodium. You also get sodium by adding salt to food.  Try to cook at home, eat more fresh fruits and vegetables, and eat less fast food, canned, processed, or prepared foods. This information is not intended to replace advice given to you by your health care provider. Make sure you discuss any questions you have with your health care provider. Document Released: 01/11/2002 Document Revised: 07/15/2016 Document Reviewed: 07/15/2016 Elsevier Interactive Patient Education  2017 [employer].   -

## 2016-12-18 ENCOUNTER — Encounter: Payer: Self-pay | Admitting: Physical Medicine & Rehabilitation

## 2016-12-19 ENCOUNTER — Encounter: Payer: Self-pay | Admitting: Internal Medicine

## 2016-12-20 ENCOUNTER — Encounter: Payer: Self-pay | Admitting: Internal Medicine

## 2016-12-23 ENCOUNTER — Encounter: Payer: Self-pay | Admitting: Internal Medicine

## 2016-12-26 ENCOUNTER — Encounter: Payer: Medicaid Other | Attending: Physical Medicine & Rehabilitation | Admitting: Physical Medicine & Rehabilitation

## 2016-12-26 ENCOUNTER — Encounter: Payer: Self-pay | Admitting: Physical Medicine & Rehabilitation

## 2016-12-26 VITALS — BP 119/74 | HR 77 | Resp 14

## 2016-12-26 DIAGNOSIS — I10 Essential (primary) hypertension: Secondary | ICD-10-CM | POA: Diagnosis not present

## 2016-12-26 DIAGNOSIS — F329 Major depressive disorder, single episode, unspecified: Secondary | ICD-10-CM | POA: Insufficient documentation

## 2016-12-26 DIAGNOSIS — G8929 Other chronic pain: Secondary | ICD-10-CM

## 2016-12-26 DIAGNOSIS — M797 Fibromyalgia: Secondary | ICD-10-CM | POA: Insufficient documentation

## 2016-12-26 DIAGNOSIS — M25562 Pain in left knee: Secondary | ICD-10-CM | POA: Diagnosis not present

## 2016-12-26 DIAGNOSIS — F17210 Nicotine dependence, cigarettes, uncomplicated: Secondary | ICD-10-CM | POA: Diagnosis not present

## 2016-12-26 DIAGNOSIS — K7460 Unspecified cirrhosis of liver: Secondary | ICD-10-CM | POA: Insufficient documentation

## 2016-12-26 DIAGNOSIS — R269 Unspecified abnormalities of gait and mobility: Secondary | ICD-10-CM | POA: Diagnosis not present

## 2016-12-26 NOTE — Progress Notes (Signed)
Subjective:    Patient ID: Raven Torres, female    DOB: 12/22/1955, 61 y.o.   MRN: [id number]  HPI 61 y/o female with pmh of cirrhosis, HTN, depression, OA, fibromyalgia presents for evaluation of left knee pain.  Started ~08/2016.  Pt stood up from sitting up and felt pop in her knees.  Getting better.  Being careful helps the pain.  Squats and being on knees exacerbates.  Achy.  Non-radiating.  Intermittent.  Denies associated weakness, numbness, tingling. Denies falls.  Pain does not limit activities.   Pain Inventory Average Pain 8 Pain Right Now 8 My pain is sharp and aching  In the last 24 hours, has pain interfered with the following? General activity 7 Relation with others 0 Enjoyment of life 8 What TIME of day is your pain at its worst? daytime, evening, night Sleep (in general) Fair  Pain is worse with: bending, sitting, inactivity and some activites Pain improves with: rest and medication Relief from Meds: n/a  Mobility ability to climb steps?  yes do you drive?  yes Do you have any goals in this area?  yes  Function retired Do you have any goals in this area?  yes  Neuro/Psych bladder control problems bowel control problems weakness tingling trouble walking spasms  Prior Studies Any changes since last visit?  no  Physicians involved in your care Any changes since last visit?  no   Family History  Problem Relation Age of Onset  . Heart disease Mother   . Cancer Mother    Social History   Social History  . Marital status: Married    Spouse name: N/A  . Number of children: N/A  . Years of education: N/A   Social History Main Topics  . Smoking status: Current Every Day Smoker    Packs/day: 0.50    Types: Cigarettes  . Smokeless tobacco: Never Used     Comment: smoking 10 cigs daily  . Alcohol use No  . Drug use: No  . Sexual activity: No   Other Topics Concern  . None   Social History Narrative  . None   Past Surgical History:    Procedure Laterality Date  . ABDOMINAL HYSTERECTOMY    . KNEE SURGERY Right    Past Medical History:  Diagnosis Date  . Arthritis   . Colitis   . Hepatitis C    BP 119/74 (BP Location: Right Arm, Patient Position: Sitting, Cuff Size: Normal)   Pulse 77   Resp 14   SpO2 94%   Opioid Risk Score:   Fall Risk Score:  `1  Depression screen PHQ 2/9  Depression screen [facility] 2/9 12/26/2016 12/17/2016 11/19/2016 02/26/2016 02/26/2016 01/30/2016 10/25/2015  Decreased Interest 1 1 1 1 1 1 1   Down, Depressed, Hopeless 0 0 1 0 0 1 0  PHQ - 2 Score 1 1 2 1 1 2 1   Altered sleeping 1 0 2 - - 1 -  Tired, decreased energy 1 0 1 - - 1 -  Change in appetite 1 1 2  - - 0 -  Feeling bad or failure about yourself  0 0 0 - - 1 -  Trouble concentrating 0 0 0 - - 1 -  Moving slowly or fidgety/restless 0 0 0 - - 1 -  Suicidal thoughts 0 0 0 - - 0 -  PHQ-9 Score 4 2 7  - - 7 -  Difficult doing work/chores Somewhat difficult - - - - Somewhat difficult -  Some recent data might be hidden   Review of Systems  Constitutional: Positive for unexpected weight change.  HENT: Negative.   Eyes: Negative.   Respiratory: Positive for shortness of breath.   Cardiovascular: Positive for leg swelling.  Gastrointestinal: Positive for abdominal pain.  Endocrine: Negative.   Genitourinary: Positive for difficulty urinating.  Musculoskeletal: Positive for arthralgias, back pain, gait problem, myalgias and neck pain.       Spasms  Skin: Negative.   Neurological: Positive for weakness.       Tingling  Hematological: Negative.   All other systems reviewed and are negative.      Objective:   Physical Exam Gen: NAD. Vital signs reviewed HENT: Normocephalic, Atraumatic Eyes: EOMI. No discharge.  Cardio: RRR. No JVD. Pulm: B/l clear to auscultation.  Effort normal  Abd: Soft, BS+ MSK:  Gait antalgic.   Mild TTP distal medial knee.    Edema distal left knee improving per pt Neuro:     Sensation intact to light  touch in all LE dermatomes  Strength  4+/5 in all LE myotomes  SLR neg Skin: Warm and Dry. Intact    Assessment & Plan:  61 y/o female with pmh of cirrhosis, HTN, depression, OA, fibromyalgia presents for evaluation of left knee pain.    1. Left knee pain  Xray from 11/2016 reviewed, unremarkable  Labs reviewed  Referral information reviewed  Cont Heat  Cont Voltaren gel per PCP  Cont Gabapentin 300 TID per PCP  Cont Flexaril qhs PRN per PCP  Encouraged TENS PRN  Cont Bracing PRN, pt has at home  Will consider PT in future  Encouraged follow up with PCP for ?Echo due to edema  2. Gait abnormality  Does not want assistive device at present

## 2016-12-31 DIAGNOSIS — M0579 Rheumatoid arthritis with rheumatoid factor of multiple sites without organ or systems involvement: Secondary | ICD-10-CM | POA: Insufficient documentation

## 2016-12-31 DIAGNOSIS — Z79899 Other long term (current) drug therapy: Secondary | ICD-10-CM | POA: Insufficient documentation

## 2017-01-06 ENCOUNTER — Ambulatory Visit (HOSPITAL_COMMUNITY)
Admission: RE | Admit: 2017-01-06 | Discharge: 2017-01-06 | Disposition: A | Discharge status: Home / Self Care | Payer: Medicaid Other | Source: Ambulatory Visit | Attending: Internal Medicine | Admitting: Internal Medicine

## 2017-01-06 DIAGNOSIS — I700 Atherosclerosis of aorta: Secondary | ICD-10-CM | POA: Diagnosis not present

## 2017-01-06 DIAGNOSIS — B182 Chronic viral hepatitis C: Secondary | ICD-10-CM

## 2017-01-06 DIAGNOSIS — K769 Liver disease, unspecified: Secondary | ICD-10-CM | POA: Diagnosis not present

## 2017-01-06 NOTE — Progress Notes (Signed)
Patient US abdomen complete results

## 2017-01-07 ENCOUNTER — Telehealth: Payer: Self-pay

## 2017-01-07 NOTE — Telephone Encounter (Signed)
Contacted pt to go over [place] results pt is aware of results and I have forward Dr. Linus Salmons a copy of results in epic per patient request. Pt didn't have any questions or concerns

## 2017-01-15 ENCOUNTER — Telehealth: Payer: Self-pay | Admitting: *Deleted

## 2017-01-15 NOTE — Telephone Encounter (Signed)
Would you still want the referral from the rheumatologist?

## 2017-01-15 NOTE — Telephone Encounter (Signed)
I guess we need that and would be good to let them know.

## 2017-01-15 NOTE — Telephone Encounter (Signed)
Referral sheet sent to Dr Serita Grit office.

## 2017-01-15 NOTE — Telephone Encounter (Signed)
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 She wants to come here for care. RN advised we would need a referral and her records (only partial records available on EPIC).  RN will contact Dr Posey Pronto at [phone] and will fax the referral sheet. Landis Gandy, RN

## 2017-01-15 NOTE — Telephone Encounter (Signed)
Ok.  On Careeverywhere you can see recent CXR (clear) and Quantiferon positive.  She could probably just see Minh or Cassie for treatment if they want.  thanks

## 2017-01-22 DIAGNOSIS — K7469 Other cirrhosis of liver: Secondary | ICD-10-CM | POA: Insufficient documentation

## 2017-01-22 DIAGNOSIS — Z227 Latent tuberculosis: Secondary | ICD-10-CM | POA: Insufficient documentation

## 2017-02-06 ENCOUNTER — Encounter: Payer: Self-pay | Admitting: Physical Medicine & Rehabilitation

## 2017-02-06 ENCOUNTER — Encounter: Payer: Medicaid Other | Attending: Physical Medicine & Rehabilitation | Admitting: Physical Medicine & Rehabilitation

## 2017-02-06 VITALS — BP 115/65 | HR 70 | Resp 14

## 2017-02-06 DIAGNOSIS — Z720 Tobacco use: Secondary | ICD-10-CM | POA: Diagnosis not present

## 2017-02-06 DIAGNOSIS — R269 Unspecified abnormalities of gait and mobility: Secondary | ICD-10-CM

## 2017-02-06 DIAGNOSIS — K7460 Unspecified cirrhosis of liver: Secondary | ICD-10-CM | POA: Insufficient documentation

## 2017-02-06 DIAGNOSIS — F329 Major depressive disorder, single episode, unspecified: Secondary | ICD-10-CM | POA: Insufficient documentation

## 2017-02-06 DIAGNOSIS — G8929 Other chronic pain: Secondary | ICD-10-CM

## 2017-02-06 DIAGNOSIS — F17210 Nicotine dependence, cigarettes, uncomplicated: Secondary | ICD-10-CM | POA: Diagnosis not present

## 2017-02-06 DIAGNOSIS — M797 Fibromyalgia: Secondary | ICD-10-CM | POA: Diagnosis not present

## 2017-02-06 DIAGNOSIS — M25562 Pain in left knee: Secondary | ICD-10-CM | POA: Diagnosis present

## 2017-02-06 DIAGNOSIS — I10 Essential (primary) hypertension: Secondary | ICD-10-CM | POA: Insufficient documentation

## 2017-02-06 NOTE — Progress Notes (Signed)
Subjective:    Patient ID: Raven Torres, female    DOB: 21-Aug-1955, 61 y.o.   MRN: [id number]  HPI 61 y/o female with pmh of cirrhosis, HTN, depression, OA, fibromyalgia, TB presents for follow up of left knee pain.   Initially stated: Started ~08/2016.  Pt stood up from sitting up and felt pop in her knees.  Getting better.  Being careful helps the pain.  Squats and being on knees exacerbates.  Achy.  Non-radiating.  Intermittent.  Denies associated weakness, numbness, tingling. Denies falls.  Pain does not limit activities.   Last clinic visit 12/26/16.  Since last visit, she was diagnosed with TB.  She continues to take Voltaren and Gabapentin.  She stopped taking her flexaril.  She has not been using TENS unit, but states she needs to.  She has not followed up for Echo.  She has been busy going to Rheum due to arthritis in back and she was started on meds. Denies falls.   Pain Inventory Average Pain 7 Pain Right Now 7 My pain is sharp and aching  In the last 24 hours, has pain interfered with the following? General activity 6 Relation with others 0 Enjoyment of life 6 What TIME of day is your pain at its worst? evening, night Sleep (in general) Fair  Pain is worse with: bending, sitting, inactivity and some activites Pain improves with: rest and medication Relief from Meds: 5  Mobility walk without assistance how many minutes can you walk? 10 ability to climb steps?  yes do you drive?  yes Do you have any goals in this area?  yes  Function retired Do you have any goals in this area?  yes  Neuro/Psych bladder control problems bowel control problems weakness tingling trouble walking dizziness  Prior Studies Any changes since last visit?  no  Physicians involved in your care Any changes since last visit?  no   Family History  Problem Relation Age of Onset  . Heart disease Mother   . Cancer Mother    Social History   Social History  . Marital status:  Married    Spouse name: N/A  . Number of children: N/A  . Years of education: N/A   Social History Main Topics  . Smoking status: Current Every Day Smoker    Packs/day: 0.50    Types: Cigarettes  . Smokeless tobacco: Never Used     Comment: smoking 10 cigs daily  . Alcohol use No  . Drug use: No  . Sexual activity: No   Other Topics Concern  . None   Social History Narrative  . None   Past Surgical History:  Procedure Laterality Date  . ABDOMINAL HYSTERECTOMY    . KNEE SURGERY Right    Past Medical History:  Diagnosis Date  . Arthritis   . Colitis   . Hepatitis C    BP 115/65 (BP Location: Left Arm, Patient Position: Sitting, Cuff Size: Normal)   Pulse 70   Resp 14   SpO2 95%   Opioid Risk Score:   Fall Risk Score:  `1  Depression screen PHQ 2/9  Depression screen [facility] 2/9 12/26/2016 12/17/2016 11/19/2016 02/26/2016 02/26/2016 01/30/2016 10/25/2015  Decreased Interest 1 1 1 1 1 1 1   Down, Depressed, Hopeless 0 0 1 0 0 1 0  PHQ - 2 Score 1 1 2 1 1 2 1   Altered sleeping 1 0 2 - - 1 -  Tired, decreased energy 1 0 1 - -  1 -  Change in appetite 1 1 2  - - 0 -  Feeling bad or failure about yourself  0 0 0 - - 1 -  Trouble concentrating 0 0 0 - - 1 -  Moving slowly or fidgety/restless 0 0 0 - - 1 -  Suicidal thoughts 0 0 0 - - 0 -  PHQ-9 Score 4 2 7  - - 7 -  Difficult doing work/chores Somewhat difficult - - - - Somewhat difficult -  Some recent data might be hidden   Review of Systems  Constitutional: Positive for unexpected weight change.  HENT: Negative.   Eyes: Negative.   Respiratory: Positive for shortness of breath.   Cardiovascular: Negative.   Gastrointestinal: Positive for diarrhea.  Endocrine: Negative.   Genitourinary: Positive for difficulty urinating.  Musculoskeletal: Positive for arthralgias, back pain, gait problem, myalgias and neck pain.       Spasms  Skin: Positive for rash.  Neurological: Positive for dizziness and weakness.       Tingling    Hematological: Negative.   Psychiatric/Behavioral: Negative.   All other systems reviewed and are negative.      Objective:   Physical Exam Gen: NAD. Vital signs reviewed HENT: Normocephalic, Atraumatic Eyes: EOMI. No discharge.  Cardio: RRR. No JVD. Pulm: B/l clear to auscultation.  Effort normal  Abd: Soft, BS+ MSK:  Gait mildly antalgic.   No TTP distal medial left knee.    No edema  Neuro:     Strength  4+/5 in all LE myotomes Skin: Warm and Dry. Intact    Assessment & Plan:  61 y/o female with pmh of cirrhosis, HTN, depression, OA, TB, fibromyalgia presents for follow up of left knee pain.    1. Left knee pain  Xray from 11/2016 reviewed, unremarkable  Will try to hold off on Cymbalta due to liver issues  Cont Heat  Cont Voltaren gel per PCP  Cont Gabapentin 300 TID per PCP  Cont Flexaril qhs PRN per PCP  Encouraged TENS PRN  Cont Bracing PRN, pt has at home  Will order aquatic therapy  Encouraged follow up with PCP for ?Echo, pt states she needs to follow up   Chronic pain recently exaggerated by passing of husband= not interested in seeing Psychology at present  2. Gait abnormality  Does not want assistive device at present  3. Tobacco abuse  Smoking >3/4 PPD  Encouraged absitnence

## 2017-02-19 ENCOUNTER — Other Ambulatory Visit: Payer: Self-pay | Admitting: Internal Medicine

## 2017-02-19 DIAGNOSIS — Z1231 Encounter for screening mammogram for malignant neoplasm of breast: Secondary | ICD-10-CM

## 2017-04-14 ENCOUNTER — Encounter: Payer: Self-pay | Admitting: Internal Medicine

## 2017-04-14 ENCOUNTER — Ambulatory Visit: Payer: Medicaid Other | Attending: Internal Medicine | Admitting: Internal Medicine

## 2017-04-14 VITALS — BP 145/83 | HR 70 | Temp 98.0°F | Resp 16 | Wt 185.2 lb

## 2017-04-14 DIAGNOSIS — K7460 Unspecified cirrhosis of liver: Secondary | ICD-10-CM | POA: Insufficient documentation

## 2017-04-14 DIAGNOSIS — M069 Rheumatoid arthritis, unspecified: Secondary | ICD-10-CM | POA: Insufficient documentation

## 2017-04-14 DIAGNOSIS — R7611 Nonspecific reaction to tuberculin skin test without active tuberculosis: Secondary | ICD-10-CM

## 2017-04-14 DIAGNOSIS — Z720 Tobacco use: Secondary | ICD-10-CM

## 2017-04-14 DIAGNOSIS — A150 Tuberculosis of lung: Secondary | ICD-10-CM | POA: Diagnosis not present

## 2017-04-14 DIAGNOSIS — Z7982 Long term (current) use of aspirin: Secondary | ICD-10-CM | POA: Diagnosis not present

## 2017-04-14 DIAGNOSIS — R918 Other nonspecific abnormal finding of lung field: Secondary | ICD-10-CM

## 2017-04-14 DIAGNOSIS — E785 Hyperlipidemia, unspecified: Secondary | ICD-10-CM | POA: Insufficient documentation

## 2017-04-14 DIAGNOSIS — M797 Fibromyalgia: Secondary | ICD-10-CM | POA: Diagnosis not present

## 2017-04-14 DIAGNOSIS — F4321 Adjustment disorder with depressed mood: Secondary | ICD-10-CM

## 2017-04-14 DIAGNOSIS — F17210 Nicotine dependence, cigarettes, uncomplicated: Secondary | ICD-10-CM | POA: Diagnosis not present

## 2017-04-14 DIAGNOSIS — I10 Essential (primary) hypertension: Secondary | ICD-10-CM

## 2017-04-14 DIAGNOSIS — Z227 Latent tuberculosis: Secondary | ICD-10-CM

## 2017-04-14 DIAGNOSIS — J392 Other diseases of pharynx: Secondary | ICD-10-CM | POA: Diagnosis not present

## 2017-04-14 DIAGNOSIS — Z23 Encounter for immunization: Secondary | ICD-10-CM

## 2017-04-14 DIAGNOSIS — Z79899 Other long term (current) drug therapy: Secondary | ICD-10-CM | POA: Insufficient documentation

## 2017-04-14 MED ORDER — GABAPENTIN 300 MG PO CAPS: 300.0000 mg | ORAL_CAPSULE | Freq: Every day | ORAL | 3 refills | Status: DC

## 2017-04-14 NOTE — Progress Notes (Signed)
Patient ID: [name], female    DOB: 12/10/1955  MRN: [id number]  CC: re-establish and Back Pain   Subjective: Raven Torres is a 61 y.o. female who presents for chronic ds management. Last Saw Dr. Janne Napoleon 12/2016 Her concerns today include:  61 year old female with history of HTN, cured hepatitis C with cirrhosis, adjustment disorder, HL, tobacco dependence, and rheumatoid arthritis/fibromyalgia followed by rheumatology at [facility]  1. RA: on Plaquinal 200 mg BID but not well control. Her rheumatologist is wanting to try a biologic. Had +quantiferon test as part of screening. Seen by ID. Started on INH.  -plan for 9 mths -taking Vit B6 OTC once a day -ID Ordered CT scan of neck because she was having pain in the throat and neck swelling. Results of CT below:  IMPRESSION:  1. No acute abnormality within the cervical soft tissues. There is no mass lesion underlying the palpable marker over the left upper neck. 2. Severe emphysematous changes in the visualized lung apices. There are at least two 4 mm pulmonary nodules in the right lung apex. Suggest a dedicated chest CT to evaluate the remaining lung fields. 3. Small 10 mm gas pocket at the right tracheoesophageal groove at the thoracic inlet, differential diagnoses could include a small diverticulum from the posterior trachea, from the right lung apex, or possibly from the esophagus.  3. Tob dep: -smoking 3/4 pk a day. Trying to quit Intentional wgh loss "because I want to loss wgh."  She is intentionally losing wait because she knows once she quits smoking, she will probably eat more to compensate for not having cigarette  4. She cut back on Gabapentin to one a day because she felt it was affecting her memory.  Use to take Flexeril only at night to help her sleep but not now since being on INH   "Since I'm by myself, I need to remember things." -takes it for pain in the lower back that radiates to RT buttock -also gets  generalized pain from fibromyalgia.  "Just little contact touches hurt." -would like to try some physical therapy   5. HTN/HL Compliant with Lis/HCTZ and Lipitor -does not check BP -limits salt 6. She stopped Lexapro. "It made me feel like a sombie." Doing ok; still grieves for her husband but things are getting better.    Patient Active Problem List   Diagnosis Date Noted  . Diabetes mellitus screening 11/19/2016  . Fibromyalgia 05/15/2015  . Elevated BP 08/23/2014  . Rheumatoid arthritis ([place]) 08/22/2014  . HTN (hypertension) 08/22/2014  . Smoking 08/22/2014  . Hepatic cirrhosis ([place]) 05/31/2014  . Chronic hepatitis C virus infection ([place]) 05/03/2014     Current Outpatient Prescriptions on File Prior to Visit  Medication Sig Dispense Refill  . acetaminophen (TYLENOL) 500 MG tablet Take 500-1,000 mg by mouth daily.    Marland Kitchen albuterol (PROAIR HFA) 108 (90 Base) MCG/ACT inhaler INHALE 2 PUFFS INTO THE LUNGS EVERY 6 HOURS AS NEEDED FOR WHEEZING OR SHORTNESS OF BREATH 8.5 g 0  . aspirin EC 81 MG tablet Take 1 tablet (81 mg total) by mouth daily. 90 tablet 3  . atorvastatin (LIPITOR) 20 MG tablet TAKE 1 TABLET(20 MG) BY MOUTH DAILY 90 tablet 3  . cyclobenzaprine (FLEXERIL) 10 MG tablet Take 1 tablet (10 mg total) by mouth 3 (three) times daily as needed for muscle spasms. 30 tablet 3  . diclofenac sodium (VOLTAREN) 1 % GEL Apply 2 g topically 4 (four) times daily. 1 Tube  2  . isoniazid (NYDRAZID) 300 MG tablet Take by mouth daily.    Marland Kitchen lisinopril-hydrochlorothiazide (PRINZIDE,ZESTORETIC) 10-12.5 MG tablet Take 1 tablet by mouth daily. 90 tablet 3  . nystatin cream (MYCOSTATIN) Apply 1 application topically 2 (two) times daily. 30 g 0  . Vitamin D, Ergocalciferol, (DRISDOL) 50000 units CAPS capsule Take 1 capsule (50,000 Units total) by mouth every 7 (seven) days. (Patient not taking: Reported on 04/14/2017) 12 capsule 0   No current facility-administered medications on file prior to visit.      Allergies  Allergen Reactions  . Amitriptyline Other (See Comments)    Eye spasms per pt  . Codeine Nausea Only  . Tramadol Nausea Only    Social History   Social History  . Marital status: Married    Spouse name: N/A  . Number of children: N/A  . Years of education: N/A   Occupational History  . Not on file.   Social History Main Topics  . Smoking status: Current Every Day Smoker    Packs/day: 0.50    Types: Cigarettes  . Smokeless tobacco: Never Used     Comment: smoking 10 cigs daily  . Alcohol use No  . Drug use: No  . Sexual activity: No   Other Topics Concern  . Not on file   Social History Narrative  . No narrative on file    Family History  Problem Relation Age of Onset  . Heart disease Mother   . Cancer Mother     Past Surgical History:  Procedure Laterality Date  . ABDOMINAL HYSTERECTOMY    . KNEE SURGERY Right     ROS: Review of Systems Negative except as stated above  PHYSICAL EXAM: BP (!) 145/83   Pulse 70   Temp 98 F (36.7 C) (Oral)   Resp 16   Wt 185 lb 3.2 oz (84 kg)   SpO2 95%   BMI 29.89 kg/m   BP 130/50 Wt Readings from Last 3 Encounters:  04/14/17 185 lb 3.2 oz (84 kg)  12/17/16 190 lb 9.6 oz (86.5 kg)  11/19/16 193 lb 6.4 oz (87.7 kg)    Physical Exam  General appearance - alert, well appearing, pleasant older caucasian FM and in no distress Mental status - oriented x 3 but tangential in her history giving and thought process Mouth - mucous membranes moist, pharynx normal without lesions Neck - supple, no significant adenopathy. No crepitus on palpation of soft tissues of neck Chest - slightly dec. No wheezes, crackles Heart - normal rate, regular rhythm, normal S1, S2, no murmurs, rubs, clicks or gallops Musculoskeletal - no tenderness on palpation of LS spine Extremities - no LE edema   Depression screen [facility] 2/9 04/14/2017 12/26/2016 12/17/2016 11/19/2016 02/26/2016  Decreased Interest 3 1 1 1 1   Down,  Depressed, Hopeless 3 0 0 1 0  PHQ - 2 Score 6 1 1 2 1   Altered sleeping 3 1 0 2 -  Tired, decreased energy 1 1 0 1 -  Change in appetite 1 1 1 2  -  Feeling bad or failure about yourself  0 0 0 0 -  Trouble concentrating 0 0 0 0 -  Moving slowly or fidgety/restless 0 0 0 0 -  Suicidal thoughts 0 0 0 0 -  PHQ-9 Score 11 4 2 7  -  Difficult doing work/chores - Somewhat difficult - - -  Some recent data might be hidden    GAD 7 : Generalized Anxiety Score 04/14/2017  12/17/2016 11/19/2016 02/26/2016  Nervous, Anxious, on Edge 1 1 1 1   Control/stop worrying 1 1 1 1   Worry too much - different things 2 0 1 1  Trouble relaxing 0 1 2 1   Restless 0 0 0 0  Easily annoyed or irritable 0 0 0 0  Afraid - awful might happen 1 0 0 0  Total GAD 7 Score 5 3 5 4    Lab Results  Component Value Date   WBC 8.1 11/19/2016   HGB 17.5 (H) 11/19/2016   HCT 51.1 (H) 11/19/2016   MCV 89 11/19/2016   PLT 174 11/19/2016     Chemistry      Component Value Date/Time   NA 138 11/19/2016 1223   K 4.9 11/19/2016 1223   CL 98 11/19/2016 1223   CO2 26 11/19/2016 1223   BUN 7 (L) 11/19/2016 1223   CREATININE 0.81 11/19/2016 1223   CREATININE 0.70 02/26/2016 1143      Component Value Date/Time   CALCIUM 10.1 11/19/2016 1223   ALKPHOS 52 02/26/2016 1143   AST 27 02/26/2016 1143   ALT 13 02/26/2016 1143   BILITOT 0.6 02/26/2016 1143       ASSESSMENT AND PLAN: 1. Essential hypertension -at goal Cont Lis-HCTZ  2. Tobacco abuse Patient advised to quit smoking. Discussed health risks associated with smoking including lung and other types of cancers, chronic lung diseases and CV risks.. Pt ready actively trying to quit by cutting back. Not interested in NRT or Chantix  3. Fibromyalgia Followed by rheumatology  4. Lung nodules - CT CHEST NODULE FOLLOW UP LOW DOSE W/O; Future  5. Need for influenza vaccination We forgot to give this today as discussed with pt. Will give on f/u visit in 1 mth  6.  TB lung, latent On INH and Vit B6. Followed by ID. She thinks INH has caused the throat issue  7. Adjustment disorder with depressed mood -much better. Observe off Lexapro  8. Throat irritation And abnormal finding of small gas pocket at tracheoesophageal groove at thoracic inlet. No crepitus on palpation of neck soft tissues.Will see if this bares out on repeat imaging ordered above. If so, will refer.  Patient was given the opportunity to ask questions.  Patient verbalized understanding of the plan and was able to repeat key elements of the plan.   Orders Placed This Encounter  Procedures  . CT CHEST NODULE FOLLOW UP LOW DOSE W/O     Requested Prescriptions   Signed Prescriptions Disp Refills  . gabapentin (NEURONTIN) 300 MG capsule 90 capsule 3    Sig: Take 1 capsule (300 mg total) by mouth daily.    Return in about 1 month (around 05/14/2017).  Karle Plumber, MD, FACP

## 2017-04-16 DIAGNOSIS — Z227 Latent tuberculosis: Secondary | ICD-10-CM | POA: Insufficient documentation

## 2017-04-16 DIAGNOSIS — R918 Other nonspecific abnormal finding of lung field: Secondary | ICD-10-CM | POA: Insufficient documentation

## 2017-04-17 ENCOUNTER — Ambulatory Visit (HOSPITAL_COMMUNITY)
Admission: RE | Admit: 2017-04-17 | Discharge: 2017-04-17 | Disposition: A | Discharge status: Home / Self Care | Payer: Medicaid Other | Source: Ambulatory Visit | Attending: Internal Medicine | Admitting: Internal Medicine

## 2017-04-17 DIAGNOSIS — I2510 Atherosclerotic heart disease of native coronary artery without angina pectoris: Secondary | ICD-10-CM | POA: Insufficient documentation

## 2017-04-17 DIAGNOSIS — R918 Other nonspecific abnormal finding of lung field: Secondary | ICD-10-CM | POA: Insufficient documentation

## 2017-04-17 DIAGNOSIS — I700 Atherosclerosis of aorta: Secondary | ICD-10-CM | POA: Diagnosis not present

## 2017-04-17 DIAGNOSIS — J439 Emphysema, unspecified: Secondary | ICD-10-CM | POA: Diagnosis not present

## 2017-04-21 ENCOUNTER — Telehealth: Payer: Self-pay | Admitting: Internal Medicine

## 2017-04-21 NOTE — Telephone Encounter (Signed)
PT called asking for CT results

## 2017-04-21 NOTE — Telephone Encounter (Signed)
I have not received results. Once result are resulted by physician either the physician or nurse will give patient a call

## 2017-04-21 NOTE — Telephone Encounter (Signed)
Patient calling for CT results. 

## 2017-04-22 NOTE — Telephone Encounter (Signed)
I have not received results. Once result are resulted by physician either the physician or nurse will give patient a call

## 2017-04-23 ENCOUNTER — Telehealth: Payer: Self-pay | Admitting: Internal Medicine

## 2017-04-23 DIAGNOSIS — I7090 Unspecified atherosclerosis: Secondary | ICD-10-CM

## 2017-04-23 DIAGNOSIS — R918 Other nonspecific abnormal finding of lung field: Secondary | ICD-10-CM

## 2017-04-23 DIAGNOSIS — I708 Atherosclerosis of other arteries: Secondary | ICD-10-CM

## 2017-04-23 MED ORDER — GABAPENTIN 300 MG PO CAPS: 300.0000 mg | ORAL_CAPSULE | Freq: Every day | ORAL | 3 refills | Status: DC

## 2017-04-23 NOTE — Telephone Encounter (Signed)
PC placed to pt today to discuss results of recent CT scan of the chest. This revealed multiple nodules in the lungs, larges of which was 1 cm in the LT lung. + emphysematous changes. Also, significant 3 vessel atherosclerosis seen in cardiac vessels. The small amount of air seen b/w the trachea and esophagus on CT done at [facility] was not mentioned on this CT.  I recommend referral to pulmonary and cardiology. Pt on ASA and statin. Continue to encourage her to work towards smoking cessation. Advise that she also have pulmonary look at the CT scan of neck done through [facility] that showed the small air pocket b/w trachea and esophagus.

## 2017-05-09 ENCOUNTER — Encounter: Payer: Self-pay | Admitting: Physical Medicine & Rehabilitation

## 2017-05-09 ENCOUNTER — Encounter: Payer: Medicaid Other | Attending: Physical Medicine & Rehabilitation | Admitting: Physical Medicine & Rehabilitation

## 2017-05-09 VITALS — BP 128/72 | HR 74 | Resp 14

## 2017-05-09 DIAGNOSIS — G8929 Other chronic pain: Secondary | ICD-10-CM | POA: Diagnosis not present

## 2017-05-09 DIAGNOSIS — F329 Major depressive disorder, single episode, unspecified: Secondary | ICD-10-CM | POA: Insufficient documentation

## 2017-05-09 DIAGNOSIS — Z720 Tobacco use: Secondary | ICD-10-CM

## 2017-05-09 DIAGNOSIS — R269 Unspecified abnormalities of gait and mobility: Secondary | ICD-10-CM | POA: Insufficient documentation

## 2017-05-09 DIAGNOSIS — R918 Other nonspecific abnormal finding of lung field: Secondary | ICD-10-CM

## 2017-05-09 DIAGNOSIS — F17210 Nicotine dependence, cigarettes, uncomplicated: Secondary | ICD-10-CM | POA: Diagnosis not present

## 2017-05-09 DIAGNOSIS — I2510 Atherosclerotic heart disease of native coronary artery without angina pectoris: Secondary | ICD-10-CM

## 2017-05-09 DIAGNOSIS — K7460 Unspecified cirrhosis of liver: Secondary | ICD-10-CM | POA: Insufficient documentation

## 2017-05-09 DIAGNOSIS — I10 Essential (primary) hypertension: Secondary | ICD-10-CM | POA: Insufficient documentation

## 2017-05-09 DIAGNOSIS — M797 Fibromyalgia: Secondary | ICD-10-CM | POA: Insufficient documentation

## 2017-05-09 DIAGNOSIS — M25562 Pain in left knee: Secondary | ICD-10-CM | POA: Insufficient documentation

## 2017-05-09 NOTE — Patient Instructions (Signed)
Please trial over the counter Lidoderm patches

## 2017-05-09 NOTE — Progress Notes (Signed)
Subjective:    Patient ID: Raven Torres, female    DOB: 1955/09/12, 61 y.o.   MRN: [id number]  HPI 61 y/o female with pmh of cirrhosis, HTN, depression, OA, fibromyalgia, TB presents for follow up of left knee pain.   Initially stated: Started ~08/2016.  Pt stood up from sitting up and felt pop in her knees.  Getting better.  Being careful helps the pain.  Squats and being on knees exacerbates.  Achy.  Non-radiating.  Intermittent.  Denies associated weakness, numbness, tingling. Denies falls.  Pain does not limit activities.   Last clinic visit 02/06/17.  Since last visit, she states she was diagnosed with lymphoadenopathy, lung nodules, and TB. Notes reviewed, she was recommended to follow up with Pulm and Cards for CAD and ?Echo as well, which she states in a couple of weeks.  She continues to smoke, but states she is trying to quit. She stopped using the TENS unit.  Insurance did not accept medicaid.  She has not gotten a chance to go to the [facility].  Denies falls.   Pain Inventory Average Pain 7 Pain Right Now 7 My pain is sharp and aching  In the last 24 hours, has pain interfered with the following? General activity 6 Relation with others 0 Enjoyment of life 6 What TIME of day is your pain at its worst? evening, night Sleep (in general) Fair  Pain is worse with: bending, sitting, inactivity and some activites Pain improves with: rest and medication Relief from Meds: 5  Mobility walk without assistance how many minutes can you walk? 10 ability to climb steps?  yes do you drive?  yes Do you have any goals in this area?  yes  Function retired Do you have any goals in this area?  yes  Neuro/Psych bladder control problems bowel control problems weakness tingling trouble walking dizziness  Prior Studies Any changes since last visit?  no  Physicians involved in your care Any changes since last visit?  no   Family History  Problem Relation Age of Onset  . Heart  disease Mother   . Cancer Mother    Social History   Social History  . Marital status: Married    Spouse name: N/A  . Number of children: N/A  . Years of education: N/A   Social History Main Topics  . Smoking status: Current Every Day Smoker    Packs/day: 0.50    Types: Cigarettes  . Smokeless tobacco: Never Used     Comment: smoking 10 cigs daily  . Alcohol use No  . Drug use: No  . Sexual activity: No   Other Topics Concern  . None   Social History Narrative  . None   Past Surgical History:  Procedure Laterality Date  . ABDOMINAL HYSTERECTOMY    . KNEE SURGERY Right    Past Medical History:  Diagnosis Date  . Arthritis   . Colitis   . Hepatitis C    BP 128/72 (BP Location: Right Arm, Patient Position: Sitting, Cuff Size: Normal)   Pulse 74   Resp 14   SpO2 94%   Opioid Risk Score:   Fall Risk Score:  `1  Depression screen PHQ 2/9  Depression screen [facility] 2/9 04/14/2017 12/26/2016 12/17/2016 11/19/2016 02/26/2016 02/26/2016 01/30/2016  Decreased Interest 3 1 1 1 1 1 1   Down, Depressed, Hopeless 3 0 0 1 0 0 1  PHQ - 2 Score 6 1 1 2 1 1 2   Altered sleeping  3 1 0 2 - - 1  Tired, decreased energy 1 1 0 1 - - 1  Change in appetite 1 1 1 2  - - 0  Feeling bad or failure about yourself  0 0 0 0 - - 1  Trouble concentrating 0 0 0 0 - - 1  Moving slowly or fidgety/restless 0 0 0 0 - - 1  Suicidal thoughts 0 0 0 0 - - 0  PHQ-9 Score 11 4 2 7  - - 7  Difficult doing work/chores - Somewhat difficult - - - - Somewhat difficult  Some recent data might be hidden   Review of Systems  Constitutional: Positive for unexpected weight change.  HENT: Negative.   Eyes: Negative.   Respiratory: Positive for shortness of breath.   Cardiovascular: Negative.   Gastrointestinal: Positive for diarrhea.  Endocrine: Negative.   Genitourinary: Positive for difficulty urinating.  Musculoskeletal: Positive for arthralgias, back pain, gait problem, myalgias and neck pain.       Spasms    Skin: Positive for rash.  Neurological: Positive for dizziness and weakness.       Tingling  Hematological: Negative.   Psychiatric/Behavioral: Negative.   All other systems reviewed and are negative.      Objective:   Physical Exam Gen: NAD. Vital signs reviewed HENT: Normocephalic, Atraumatic Eyes: EOMI. No discharge.  Cardio: RRR. No JVD. Pulm: B/l clear to auscultation.  Effort normal Abd: Soft, BS+ MSK:  Gait mildly antalgic.   No TTP left knee.    No edema  Neuro:     Strength  4+/5 in all LE myotomes Skin: Warm and Dry. Intact    Assessment & Plan:  61 y/o female with pmh of cirrhosis, HTN, depression, OA, TB, fibromyalgia presents for follow up of left knee pain.    1. Left knee pain  Xray from 11/2016 reviewed, unremarkable  Will try to hold off on Cymbalta due to liver issues  Cont Heat  Cont Voltaren gel  Cont Gabapentin 300 daily, side effects with TID dosing   Cont Flexaril qhs PRN, does not need to take much at present  Hold off on TENS due to lung nodules   Cont Bracing PRN, pt has at home  Pt to follow up with pool therapy, states she has a lot of medical issues going at present  Chronic pain recently exaggerated by passing of husband, still not interested in seeing Psychology  She states she was recently prescribed something by her Rheum, but does not know what  Recommend OTC lidoderm patch  2. Gait abnormality  Does not want assistive device at present  3. Tobacco abuse  Smoking >3/4 PPD  Encouraged abstinence  4. Lung nodules  Hold TENS  Follow up with Pulm  5. CAD  Follow up Cards  Avoid NSAIDs  6. TB  Cont meds

## 2017-05-15 ENCOUNTER — Ambulatory Visit: Payer: Medicaid Other | Attending: Internal Medicine | Admitting: Internal Medicine

## 2017-05-15 ENCOUNTER — Encounter: Payer: Self-pay | Admitting: Internal Medicine

## 2017-05-15 VITALS — BP 127/73 | HR 77 | Temp 97.7°F | Resp 16 | Wt 187.6 lb

## 2017-05-15 DIAGNOSIS — F17210 Nicotine dependence, cigarettes, uncomplicated: Secondary | ICD-10-CM | POA: Insufficient documentation

## 2017-05-15 DIAGNOSIS — Z7982 Long term (current) use of aspirin: Secondary | ICD-10-CM | POA: Insufficient documentation

## 2017-05-15 DIAGNOSIS — Z809 Family history of malignant neoplasm, unspecified: Secondary | ICD-10-CM | POA: Diagnosis not present

## 2017-05-15 DIAGNOSIS — M545 Low back pain: Secondary | ICD-10-CM | POA: Insufficient documentation

## 2017-05-15 DIAGNOSIS — M797 Fibromyalgia: Secondary | ICD-10-CM | POA: Diagnosis not present

## 2017-05-15 DIAGNOSIS — I10 Essential (primary) hypertension: Secondary | ICD-10-CM | POA: Insufficient documentation

## 2017-05-15 DIAGNOSIS — Z885 Allergy status to narcotic agent status: Secondary | ICD-10-CM | POA: Insufficient documentation

## 2017-05-15 DIAGNOSIS — M069 Rheumatoid arthritis, unspecified: Secondary | ICD-10-CM | POA: Insufficient documentation

## 2017-05-15 DIAGNOSIS — Z8249 Family history of ischemic heart disease and other diseases of the circulatory system: Secondary | ICD-10-CM | POA: Diagnosis not present

## 2017-05-15 DIAGNOSIS — I7090 Unspecified atherosclerosis: Secondary | ICD-10-CM | POA: Diagnosis not present

## 2017-05-15 DIAGNOSIS — Z79899 Other long term (current) drug therapy: Secondary | ICD-10-CM | POA: Insufficient documentation

## 2017-05-15 DIAGNOSIS — Z90710 Acquired absence of both cervix and uterus: Secondary | ICD-10-CM | POA: Insufficient documentation

## 2017-05-15 DIAGNOSIS — Z888 Allergy status to other drugs, medicaments and biological substances status: Secondary | ICD-10-CM | POA: Diagnosis not present

## 2017-05-15 DIAGNOSIS — F4320 Adjustment disorder, unspecified: Secondary | ICD-10-CM | POA: Insufficient documentation

## 2017-05-15 DIAGNOSIS — Z720 Tobacco use: Secondary | ICD-10-CM | POA: Diagnosis not present

## 2017-05-15 DIAGNOSIS — R918 Other nonspecific abnormal finding of lung field: Secondary | ICD-10-CM | POA: Diagnosis not present

## 2017-05-15 DIAGNOSIS — R7611 Nonspecific reaction to tuberculin skin test without active tuberculosis: Secondary | ICD-10-CM | POA: Diagnosis not present

## 2017-05-15 DIAGNOSIS — J432 Centrilobular emphysema: Secondary | ICD-10-CM | POA: Insufficient documentation

## 2017-05-15 DIAGNOSIS — R413 Other amnesia: Secondary | ICD-10-CM

## 2017-05-15 DIAGNOSIS — Z227 Latent tuberculosis: Secondary | ICD-10-CM

## 2017-05-15 DIAGNOSIS — I2510 Atherosclerotic heart disease of native coronary artery without angina pectoris: Secondary | ICD-10-CM | POA: Insufficient documentation

## 2017-05-15 DIAGNOSIS — I708 Atherosclerosis of other arteries: Secondary | ICD-10-CM | POA: Insufficient documentation

## 2017-05-15 DIAGNOSIS — B182 Chronic viral hepatitis C: Secondary | ICD-10-CM | POA: Diagnosis not present

## 2017-05-15 MED ORDER — TIOTROPIUM BROMIDE MONOHYDRATE 18 MCG IN CAPS: 18.0000 ug | ORAL_CAPSULE | Freq: Every day | RESPIRATORY_TRACT | 12 refills | Status: DC

## 2017-05-15 NOTE — Progress Notes (Signed)
Patient ID: Raven Torres, female    DOB: [birth year]  MRN: [id number]  CC: Follow-up   Subjective: Raven Torres is a 61 y.o. 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 Lung nodules/emphysematous changes on CT chest:  Has appt with pulmonary later this mth -down to 1/2 pk a day. Plans to start using the nicotine patches once she gets down to 5 cig/day -cough prod of clear phlegm since being on INH -+ wheezing at nights and some times in mornings when she moves around too fast. Uses Albuterol 1-2 x a day.  2. Significant cardiac atherosclerosis noted incidentally on CT -on low dose ASA -some SOB with activity  3. Latent TB: saw ID specialist ( Dr. Layne Benton in [place]) this a.m. He plans to keep her on INH for only 6 mths instead of 9 mths. 6 mths will be the 1st wk in December. She had CBC and CMP done.  4. On Last visit she told me that she cut back on Gabapentin to one a day because she felt it was affecting her memory.  Use to take Flexeril only at night to help her sleep but not now since being on INH   "Since I'm by myself, I need to remember things." -takes it for pain in the lower back that radiates to RT buttock  HM: had flu shot this a.m in our lobby.   Patient Active Problem List   Diagnosis Date Noted  . TB lung, latent 04/16/2017  . Lung nodules 04/16/2017  . Diabetes mellitus screening 11/19/2016  . Fibromyalgia 05/15/2015  . Rheumatoid arthritis ([place]) 08/22/2014  . HTN (hypertension) 08/22/2014  . Tobacco abuse 08/22/2014  . Hepatic cirrhosis ([place]) 05/31/2014  . Chronic hepatitis C virus infection ([place]) 05/03/2014     Current Outpatient Prescriptions on File Prior to Visit  Medication Sig Dispense Refill   . albuterol (PROAIR HFA) 108 (90 Base) MCG/ACT inhaler INHALE 2 PUFFS INTO THE LUNGS EVERY 6 HOURS AS NEEDED FOR WHEEZING OR SHORTNESS OF BREATH 8.5 g 0  . aspirin EC 81 MG tablet Take 1 tablet (81 mg total) by mouth daily. 90 tablet 3  . atorvastatin (LIPITOR) 20 MG tablet TAKE 1 TABLET(20 MG) BY MOUTH DAILY 90 tablet 3  . cyclobenzaprine (FLEXERIL) 10 MG tablet Take 1 tablet (10 mg total) by mouth 3 (three) times daily as needed for muscle spasms. 30 tablet 3  . diclofenac sodium (VOLTAREN) 1 % GEL Apply 2 g topically 4 (four) times daily. 1 Tube 2  . gabapentin (NEURONTIN) 300 MG capsule Take 1 capsule (300 mg total) by mouth daily. 90 capsule 3  . isoniazid (NYDRAZID) 300 MG tablet Take by mouth daily.    Marland Kitchen lisinopril-hydrochlorothiazide (PRINZIDE,ZESTORETIC) 10-12.5 MG tablet Take 1 tablet by mouth daily. 90 tablet 3  . nystatin cream (MYCOSTATIN) Apply 1 application topically 2 (two) times daily. 30 g 0  . Vitamin D, Ergocalciferol, (DRISDOL) 50000 units CAPS capsule Take 1 capsule (50,000 Units total) by mouth every 7 (seven) days. 12 capsule 0   No current facility-administered medications on file prior to visit.     Allergies  Allergen Reactions  . Amitriptyline Other (See Comments)    Eye spasms per pt  . Codeine Nausea Only  . Tramadol Nausea Only  Social History   Social History  . Marital status: Married    Spouse name: N/A  . Number of children: N/A  . Years of education: N/A   Occupational History  . Not on file.   Social History Main Topics  . Smoking status: Current Every Day Smoker    Packs/day: 0.50    Types: Cigarettes  . Smokeless tobacco: Never Used     Comment: smoking 10 cigs daily  . Alcohol use No  . Drug use: No  . Sexual activity: No   Other Topics Concern  . Not on file   Social History Narrative  . No narrative on file    Family History  Problem Relation Age of Onset  . Heart disease Mother   . Cancer Mother     Past Surgical  History:  Procedure Laterality Date  . ABDOMINAL HYSTERECTOMY    . KNEE SURGERY Right     ROS: Review of Systems Neg except as above PHYSICAL EXAM: BP 127/73   Pulse 77   Temp 97.7 F (36.5 C) (Oral)   Resp 16   Wt 187 lb 9.6 oz (85.1 kg)   SpO2 94%   BMI 30.28 kg/m   Wt Readings from Last 3 Encounters:  05/15/17 187 lb 9.6 oz (85.1 kg)  04/14/17 185 lb 3.2 oz (84 kg)  12/17/16 190 lb 9.6 oz (86.5 kg)    Physical Exam General appearance - alert, well appearing, pleasant elderly female and in no distress Mental status - pt oriented to person, place and time Neck - supple, no significant adenopathy Chest -mildly dec BL Heart - normal rate, regular rhythm, normal S1, S2, no murmurs, rubs, clicks or gallops Extremities -no LE edema  ASSESSMENT AND PLAN: 1. Tobacco abuse Advised to quit -Commended her on her efforts on cutting back. She has nicotine patches at home and plans to start using one she gets out of 5 cigarettes a day  2. Centrilobular emphysema ([place]) -continue Albuterol Add Spiriva. I went over with her how to use the inhaler and also advise that she has pharmacist show her how to use it when she fills the prescription  3. TB lung, latent -Followed by ID. On [place] until December  4. Lung nodules Has appointment with pulmonary for further evaluation later this month  5. Atherosclerosis Referred to cardiology. Continue aspirin and Lipitor  6. Memory change -Patient declined doing MMSE today stating that she was up since 4:00 this morning getting ready for her doctor's appointments and is mentally tired. We will plan to do it on her next visit  Patient was given the opportunity to ask questions.  Patient verbalized understanding of the plan and was able to repeat key elements of the plan.   No orders of the defined types were placed in this encounter.    Requested Prescriptions   Signed Prescriptions Disp Refills  . tiotropium (SPIRIVA HANDIHALER) 18  MCG inhalation capsule 30 capsule 12    Sig: Place 1 capsule (18 mcg total) into inhaler and inhale daily.    Return in about 2 months (around 07/15/2017).  Karle Plumber, MD, FACP

## 2017-05-15 NOTE — Patient Instructions (Addendum)
Start using Spiriva inhaler daily. Please ask the pharmicist to instruct you on how to use it.  Influenza Virus Vaccine injection (Fluarix) What is this medicine? INFLUENZA VIRUS VACCINE (in floo EN zuh VAHY ruhs vak SEEN) helps to reduce the risk of getting influenza also known as the flu. This medicine may be used for other purposes; ask your health care provider or pharmacist if you have questions. COMMON BRAND NAME(S): Fluarix, Fluzone What should I tell my health care provider before I take this medicine? They need to know if you have any of these conditions: -bleeding disorder like hemophilia -fever or infection -Guillain-Barre syndrome or other neurological problems -immune system problems -infection with the human immunodeficiency virus (HIV) or AIDS -low blood platelet counts -multiple sclerosis -an unusual or allergic reaction to influenza virus vaccine, eggs, chicken proteins, latex, gentamicin, other medicines, foods, dyes or preservatives -pregnant or trying to get pregnant -breast-feeding How should I use this medicine? This vaccine is for injection into a muscle. It is given by a health care professional. A copy of Vaccine Information Statements will be given before each vaccination. Read this sheet carefully each time. The sheet may change frequently. Talk to your pediatrician regarding the use of this medicine in children. Special care may be needed. Overdosage: If you think you have taken too much of this medicine contact a poison control center or emergency room at once. NOTE: This medicine is only for you. Do not share this medicine with others. What if I miss a dose? This does not apply. What may interact with this medicine? -chemotherapy or radiation therapy -medicines that lower your immune system like etanercept, anakinra, infliximab, and adalimumab -medicines that treat or prevent blood clots like warfarin -phenytoin -steroid medicines like prednisone or  cortisone -theophylline -vaccines This list may not describe all possible interactions. Give your health care provider a list of all the medicines, herbs, non-prescription drugs, or dietary supplements you use. Also tell them if you smoke, drink alcohol, or use illegal drugs. Some items may interact with your medicine. What should I watch for while using this medicine? Report any side effects that do not go away within 3 days to your doctor or health care professional. Call your health care provider if any unusual symptoms occur within 6 weeks of receiving this vaccine. You may still catch the flu, but the illness is not usually as bad. You cannot get the flu from the vaccine. The vaccine will not protect against colds or other illnesses that may cause fever. The vaccine is needed every year. What side effects may I notice from receiving this medicine? Side effects that you should report to your doctor or health care professional as soon as possible: -allergic reactions like skin rash, itching or hives, swelling of the face, lips, or tongue Side effects that usually do not require medical attention (report to your doctor or health care professional if they continue or are bothersome): -fever -headache -muscle aches and pains -pain, tenderness, redness, or swelling at site where injected -weak or tired This list may not describe all possible side effects. Call your doctor for medical advice about side effects. You may report side effects to FDA at 1-800-FDA-1088. Where should I keep my medicine? This vaccine is only given in a clinic, pharmacy, doctor's office, or other health care setting and will not be stored at home. NOTE: This sheet is a summary. It may not cover all possible information. If you have questions about this medicine, talk to  your doctor, pharmacist, or health care provider.  2018 Elsevier/Gold Standard (2008-02-17 09:30:40)

## 2017-05-20 ENCOUNTER — Other Ambulatory Visit: Payer: Medicaid Other

## 2017-05-20 ENCOUNTER — Ambulatory Visit (INDEPENDENT_AMBULATORY_CARE_PROVIDER_SITE_OTHER): Payer: Medicaid Other | Admitting: Pulmonary Disease

## 2017-05-20 ENCOUNTER — Encounter: Payer: Self-pay | Admitting: Pulmonary Disease

## 2017-05-20 VITALS — BP 124/64 | HR 67 | Ht 66.0 in | Wt 185.6 lb

## 2017-05-20 DIAGNOSIS — F17210 Nicotine dependence, cigarettes, uncomplicated: Secondary | ICD-10-CM | POA: Diagnosis not present

## 2017-05-20 DIAGNOSIS — J438 Other emphysema: Secondary | ICD-10-CM | POA: Diagnosis not present

## 2017-05-20 DIAGNOSIS — R911 Solitary pulmonary nodule: Secondary | ICD-10-CM | POA: Diagnosis not present

## 2017-05-20 NOTE — Progress Notes (Signed)
Raven Torres    [id number]    1955-08-31  Primary Care Physician:Johnson, Dalbert Batman, MD  Referring Physician: Ladell Pier, MD [address] [place], [place] [address]  Chief complaint:  Consult for lung nodules  HPI: 61 year old with history of cirrhosis secondary to hepatitis C, hypertension, depression, arthritis, fibromyalgia, latent TB. She has history of rheumatoid arthritis being treated with Plaquenil. Also noted to have a positive QuantiFERON and is on INH therapy for past 5 months for treatment of latent TB. She had a CT scan which showed multiple small pulmonary nodules and has been referred to [facility] for further evaluation. She has 40-pack-year smoking history and continues to smoke half pack per day. She is recently started on Spiriva for COPD 4 days ago. She reports that this has improved her breathing. Her chief complaint is dyspnea with rest and activity, cough with white mucus. No fevers, chills.  Outpatient Encounter Prescriptions as of 05/20/2017  Medication Sig  . albuterol (PROAIR HFA) 108 (90 Base) MCG/ACT inhaler INHALE 2 PUFFS INTO THE LUNGS EVERY 6 HOURS AS NEEDED FOR WHEEZING OR SHORTNESS OF BREATH  . aspirin EC 81 MG tablet Take 1 tablet (81 mg total) by mouth daily.  Marland Kitchen atorvastatin (LIPITOR) 20 MG tablet TAKE 1 TABLET(20 MG) BY MOUTH DAILY  . cyclobenzaprine (FLEXERIL) 10 MG tablet Take 1 tablet (10 mg total) by mouth 3 (three) times daily as needed for muscle spasms.  . diclofenac sodium (VOLTAREN) 1 % GEL Apply 2 g topically 4 (four) times daily.  Marland Kitchen gabapentin (NEURONTIN) 300 MG capsule Take 1 capsule (300 mg total) by mouth daily.  Marland Kitchen isoniazid (NYDRAZID) 300 MG tablet Take by mouth daily.  Marland Kitchen lisinopril-hydrochlorothiazide (PRINZIDE,ZESTORETIC) 10-12.5 MG tablet Take 1 tablet by mouth daily.  Marland Kitchen nystatin cream (MYCOSTATIN) Apply 1 application topically 2 (two) times daily.  Marland Kitchen tiotropium (SPIRIVA HANDIHALER) 18 MCG inhalation capsule Place 1  capsule (18 mcg total) into inhaler and inhale daily.  . Vitamin D, Ergocalciferol, (DRISDOL) 50000 units CAPS capsule Take 1 capsule (50,000 Units total) by mouth every 7 (seven) days.   No facility-administered encounter medications on file as of 05/20/2017.     Allergies as of 05/20/2017 - Review Complete 05/15/2017  Allergen Reaction Noted  . Amitriptyline Other (See Comments) 11/19/2016  . Codeine Nausea Only 01/27/2015  . Tramadol Nausea Only 01/27/2015    Past Medical History:  Diagnosis Date  . Arthritis   . Colitis   . Hepatitis C     Past Surgical History:  Procedure Laterality Date  . ABDOMINAL HYSTERECTOMY    . KNEE SURGERY Right     Family History  Problem Relation Age of Onset  . Heart disease Mother   . Cancer Mother     Social History   Social History  . Marital status: Married    Spouse name: N/A  . Number of children: N/A  . Years of education: N/A   Occupational History  . Not on file.   Social History Main Topics  . Smoking status: Current Every Day Smoker    Packs/day: 0.50    Types: Cigarettes  . Smokeless tobacco: Never Used     Comment: smoking 10 cigs daily  . Alcohol use No  . Drug use: No  . Sexual activity: No   Other Topics Concern  . Not on file   Social History Narrative  . No narrative on file    Review of systems:  Review of Systems  Constitutional: Negative for fever and chills.  HENT: Negative.   Eyes: Negative for blurred vision.  Respiratory: as per HPI  Cardiovascular: Negative for chest pain and palpitations.  Gastrointestinal: Negative for vomiting, diarrhea, blood per rectum. Genitourinary: Negative for dysuria, urgency, frequency and hematuria.  Musculoskeletal: Negative for myalgias, back pain and joint pain.  Skin: Negative for itching and rash.  Neurological: Negative for dizziness, tremors, focal weakness, seizures and loss of consciousness.  Endo/Heme/Allergies: Negative for environmental allergies.    Psychiatric/Behavioral: Negative for depression, suicidal ideas and hallucinations.  All other systems reviewed and are negative.  Physical Exam: There were no vitals taken for this visit. Gen:      No acute distress HEENT:  EOMI, sclera anicteric Neck:     No masses; no thyromegaly Lungs:    Clear to auscultation bilaterally; normal respiratory effort CV:         Regular rate and rhythm; no murmurs Abd:      + bowel sounds; soft, non-tender; no palpable masses, no distension Ext:    No edema; adequate peripheral perfusion Skin:      Warm and dry; no rash Neuro: alert and oriented x 3 Psych: normal mood and affect  Data Reviewed: CT scan 04/18/17-multiple subcentimeter pulmonary nodule. The largest one is a left lower lobe, 1 cm in size. Diffuse emphysematous changes. Aortic, coronary atherosclerosis. I had reviewed the images personally.  Assessment:  Pulmonary nodules Don't suspect active TB as CT scan is not very suggestive. The nodules may be from rheumatoid arthritis. Given her smoking history and risk for malignancy these will need to followed for any sign of growth. We will order a follow-up CT without contrast in 6 months  Emphysema She likely has COPD secondary to smoking. She is responded well to Spiriva and will continue the same. Order pulmonary function test for further evaluation and alpha-1 antitrypsin levels and phenotype  Active smoker Smoking cessation discussed. She wants to quit on her own. Time spent counseling-5 Minutes.  Plan/Recommendations: - CT chest without contrast in 6 months - PFTs, A1AT levels and phenotype - Continue spiriva.  Marshell Garfinkel MD [facility] Pager [phone] 05/20/2017, 8:57 AM  CC: Ladell Pier, MD

## 2017-05-20 NOTE — Patient Instructions (Signed)
We will get pulmonary function test and alpha-1 antitrypsin levels, phenotype for further evaluation of this your emphysema Continue using the Spiriva We'll schedule for a CT of the chest without contrast in 6 months to follow-up on the lung nodule

## 2017-05-21 NOTE — Progress Notes (Signed)
.[email or web address]  Title: Blood Sample Collection in Subjects with Pulmonary Nodules or CT Suspicion of Lung  Cancer  Study Number: [id number]; Protocol: Version 4.0, Amendment 3.0 Date: 06Jun2017  Sponsor: [employer] [address] [place] [address]  Principal Investigator: Dr. Marshell Garfinkel ; Sub Investigators: Dr. Brand Males, Dr. Simonne Maffucci  Synopsis: This is multi-site, sample collection study.The study is to obtain de-identified, clinically characterized, whole blood specimens for use in assessing new biomarkers for the detection of neoplasms off the lung.  The study will sample blood (7mL) from approximately 2250 subject; 1000 will have CT suspicion of lung cancer which is ultimately diagnosed as lung cancer and approximately 1000 subjects will have pulmonary nodules greater than or equal to 4 mm .  Total Number of Subjects in Trial: 2250   CT suspicion of lung Cancer ultimately diagnosed  CT suspicion of lung Cancer ultimately benign Pulmonary  Nodules Greater than/equal to 4 mm  Pulmonary Nodules Greater than/equal to 15 mm Pulmonary Nodules Greater than/equal to 10-15 mm  Greater than/equal to 4-9 mm        Number of Subjects 1000 250 1000 100 100 Remaining      Key Inclusion:  Suspicion of Cancer Subjects:  Subject is female or female, 63-7 years of age, inclusive  Subject has CT suspicion of lung cancer and is scheduled for biopsy or other diagnostic procedure.  Pulmonary Nodule Subjects:  Subject is female or female, 71-63 years of age, inclusive  Subject has a recent (within 90 days of enrollment) CT radiological diagnosis of pulmonary nodule(s) greater than equal to 4 mm without a scheduled biopsy/diagnostic procedure.   All Subjects:  Subject understands the study procedures and is able to provide informed consent to participate in the study and authorization for release of relevant protected health information to the study investigator.  Key  Exclusion:  CT with IV contrast within 1 day (or 24 hours) of blood collection  Prior history of cancer with the exception of non melanoma skin cancer and most in-situ carcinomas.  Prior removal of the lung, excluding percutaneous lung biopsy.  Any cytotoxic therapy including chemotherapy and radiation therapy.           Key Features: Lung cancer is now the most common cause of cancer death among men and women.  Key Endpoints: Using whole blood specimens for use in assessing new biomarkers for detection of neoplasms of the lung and for a biorepository for future cancer-related diagnostic test development.  Safety of [employer]: Routine phlebotomy risks of minimal transient pain and possible hematoma formation  Fleischner Society Guidelines for incidental pulmonary nodule follow up:   Nodule Size(mm) Low- Risk Patient High Risk Patient  ? 4 No Follow-up Needed Follow-up at 12 mo; if unchanged,no further follow up   > 4-6 Follow-up CT at 12 months;   if unchanged, no further follow up  Initial follow up CT at 6-12 mo;then at 18-24 months if no change  > 6-8  Initial follow-up CT at 6-12 months then at 18-24 months if no change       Initial follow-up CT at 3-53mos then at 9-12 and 24 months if no change  > 8 Follow-up CT at around 3, 9, and 24 months, dynamic contrast- enhanced CT, PET, and/or biopsy Same as for low-risk patient    Clinical Research Coordinator / Research RN note : This visit for Subject Raven Torres with DOB: May 10, 1956 on 05/20/2017 for the above protocol is for screening and enrollment  for the purpose of research. The consent for this encounter is and Protocol Version are currently IRB approved. Subject expressed interest and consent in continuing as a study subject. Subject confirmed that there was no  change in contact information (e.g. address, telephone, email). Subject thanked for participation in research and contribution to science.   In this visit 05/20/2017  the subject was evaluated by investigator named Dr. Vaughan Browner . This research coordinator has verified that the investigator is uptodate with his training logs.   Because this visit is a key visit of screening and enrollment this visit is under direct supervision of the PI Dr.Mannam . This PI is available for this visit.   The CRC has confirmed that the note will be routed to PI.  Signed by  August Luz Clinical Research Coordinator / Nurse Horatio Pel, [place] 8:27 AM 05/21/2017

## 2017-05-23 LAB — ALPHA-1 ANTITRYPSIN PHENOTYPE: A1 ANTITRYPSIN SER: 162 mg/dL (ref 83–199)

## 2017-06-03 ENCOUNTER — Ambulatory Visit (INDEPENDENT_AMBULATORY_CARE_PROVIDER_SITE_OTHER): Payer: Medicaid Other | Admitting: Internal Medicine

## 2017-06-03 ENCOUNTER — Encounter: Payer: Self-pay | Admitting: Internal Medicine

## 2017-06-03 VITALS — BP 120/73 | HR 76 | Ht 66.0 in | Wt 187.0 lb

## 2017-06-03 DIAGNOSIS — I2584 Coronary atherosclerosis due to calcified coronary lesion: Secondary | ICD-10-CM | POA: Diagnosis not present

## 2017-06-03 DIAGNOSIS — R5383 Other fatigue: Secondary | ICD-10-CM

## 2017-06-03 DIAGNOSIS — I2510 Atherosclerotic heart disease of native coronary artery without angina pectoris: Secondary | ICD-10-CM | POA: Diagnosis not present

## 2017-06-03 DIAGNOSIS — R0602 Shortness of breath: Secondary | ICD-10-CM

## 2017-06-03 NOTE — Patient Instructions (Addendum)
Your physician has requested that you have a lexiscan myoview. For further information please visit [email or web address]. Please follow instruction sheet, as given.  Your physician recommends that you schedule a follow-up appointment with Dr. Hilty after your test.   

## 2017-06-04 DIAGNOSIS — R0602 Shortness of breath: Secondary | ICD-10-CM | POA: Insufficient documentation

## 2017-06-04 DIAGNOSIS — R5383 Other fatigue: Secondary | ICD-10-CM | POA: Insufficient documentation

## 2017-06-04 DIAGNOSIS — I2584 Coronary atherosclerosis due to calcified coronary lesion: Secondary | ICD-10-CM

## 2017-06-04 DIAGNOSIS — I2510 Atherosclerotic heart disease of native coronary artery without angina pectoris: Secondary | ICD-10-CM | POA: Insufficient documentation

## 2017-06-04 NOTE — Progress Notes (Signed)
OFFICE CONSULT NOTE  Chief Complaint:  Coronary artery calcification  Primary Care Physician: Ladell Pier, MD  HPI:  Raven Torres is a 61 y.o. female who is being seen today for the evaluation of coronary artery calcification at the request of Ladell Pier, MD.  This is a pleasant 61 year old female who was referred to me for evaluation of coronary artery calcification that was noted on recent CT scanning.  She has an extensive medical history including hepatitis C, latent tuberculosis on isoniazid, COPD, and ongoing tobacco abuse for more than 40 years, now reduced to 20 packs/day.  There is an extensive family history of coronary disease as well.  She reports recently worsening shortness of breath but denies any chest pain or pressure with exertion.  She also says that she has been more fatigued than usual.  Other medical problems include peripheral neuropathy, dyslipidemia and hypertension.  With regards to her shortness of breath it is mostly exertional, worse when walking up stairs or doing certain activities.  This is not associated with any wheezing.  She was recently started on Spiriva with some improvement.  PMHx:  Past Medical History:  Diagnosis Date  . Arthritis   . Colitis   . Hepatitis C   . TB (tuberculosis)     Past Surgical History:  Procedure Laterality Date  . ABDOMINAL HYSTERECTOMY    . KNEE SURGERY Right     FAMHx:  Family History  Problem Relation Age of Onset  . Heart disease Mother   . Cancer Mother     SOCHx:   reports that she has been smoking Cigarettes.  She has been smoking about 0.50 packs per day. She has never used smokeless tobacco. She reports that she does not drink alcohol or use drugs.  ALLERGIES:  Allergies  Allergen Reactions  . Amitriptyline Other (See Comments)    Eye spasms per pt  . Codeine Nausea Only  . Tramadol Nausea Only    ROS: Pertinent items noted in HPI and remainder of comprehensive ROS otherwise  negative.  HOME MEDS: Current Outpatient Prescriptions on File Prior to Visit  Medication Sig Dispense Refill  . albuterol (PROAIR HFA) 108 (90 Base) MCG/ACT inhaler INHALE 2 PUFFS INTO THE LUNGS EVERY 6 HOURS AS NEEDED FOR WHEEZING OR SHORTNESS OF BREATH 8.5 g 0  . aspirin EC 81 MG tablet Take 1 tablet (81 mg total) by mouth daily. 90 tablet 3  . atorvastatin (LIPITOR) 20 MG tablet TAKE 1 TABLET(20 MG) BY MOUTH DAILY 90 tablet 3  . CALCIUM CARBONATE-VIT D-MIN PO Take 1,200 mg by mouth.    . Cholecalciferol (VITAMIN D3) 1000 units CAPS Take by mouth.    . cyclobenzaprine (FLEXERIL) 10 MG tablet Take 1 tablet (10 mg total) by mouth 3 (three) times daily as needed for muscle spasms. 30 tablet 3  . diclofenac sodium (VOLTAREN) 1 % GEL Apply 2 g topically 4 (four) times daily. 1 Tube 2  . gabapentin (NEURONTIN) 300 MG capsule Take 1 capsule (300 mg total) by mouth daily. 90 capsule 3  . isoniazid (NYDRAZID) 300 MG tablet Take by mouth daily.    Marland Kitchen lisinopril-hydrochlorothiazide (PRINZIDE,ZESTORETIC) 10-12.5 MG tablet Take 1 tablet by mouth daily. 90 tablet 3  . nystatin cream (MYCOSTATIN) Apply 1 application topically 2 (two) times daily. 30 g 0  . pyridoxine (B-6) 100 MG tablet Take by mouth.    . tiotropium (SPIRIVA HANDIHALER) 18 MCG inhalation capsule Place 1 capsule (18 mcg total) into  inhaler and inhale daily. 30 capsule 12   No current facility-administered medications on file prior to visit.     LABS/IMAGING: No results found for this or any previous visit (from the past 48 hour(s)). No results found.  LIPID PANEL:    Component Value Date/Time   CHOL 184 11/19/2016 1223   TRIG 165 (H) 11/19/2016 1223   HDL 69 11/19/2016 1223   CHOLHDL 2.7 11/19/2016 1223   CHOLHDL 2.5 02/26/2016 1132   VLDL 28 02/26/2016 1132   LDLCALC 82 11/19/2016 1223    WEIGHTS: Wt Readings from Last 3 Encounters:  06/03/17 187 lb (84.8 kg)  05/20/17 185 lb 9.6 oz (84.2 kg)  05/15/17 187 lb 9.6 oz  (85.1 kg)    VITALS: BP 120/73 (BP Location: Left Arm, Patient Position: Sitting)   Pulse 76   Ht 5\' 6"  (1.676 m)   Wt 187 lb (84.8 kg)   BMI 30.18 kg/m   EXAM: General appearance: alert and no distress Neck: no carotid bruit, no JVD and thyroid not enlarged, symmetric, no tenderness/mass/nodules Lungs: diminished breath sounds bilaterally Heart: regular rate and rhythm Abdomen: soft, non-tender; bowel sounds normal; no masses,  no organomegaly Extremities: extremities normal, atraumatic, no cyanosis or edema Pulses: 2+ and symmetric Skin: Skin color, texture, turgor normal. No rashes or lesions Neurologic: Grossly normal : Pleasant  EKG: Normal sinus rhythm at 69, incomplete right bundle branch block- personally reviewed  ASSESSMENT: 1. Progressive dyspnea on exertion -multivessel CAC on CT scan 2. COPD 3. Latent tuberculosis on isoniazid 4. History of hepatitis C 5. Family history of coronary disease 6. Ongoing tobacco use (approximately 90-pack-year smoking history)  PLAN: 1.   Raven Torres has numerous cardiovascular risk factors and has had progressive dyspnea on exertion.  She was found to have multivessel coronary artery calcification seen on CT scan as well as aortic atherosclerosis.  As she is symptomatic it is difficult to ascertain whether her progressive dyspnea on exertion is related to coronary artery disease or her COPD.  Given her multivessel coronary artery calcium, there is a high likelihood of obstructive coronary disease and I recommend a Lexiscan Myoview to further evaluate that.  She is unable to exercise on a treadmill due to her shortness of breath.  We will plan to see her back in follow-up afterwards.  Thank you for the consultation.  Pixie Casino, MD, [place]  Attending Cardiologist  Direct Dial: [phone]  Fax: [phone]  Website:  [email or web address] 06/04/2017, 4:03 PM

## 2017-06-06 ENCOUNTER — Ambulatory Visit: Payer: Medicare Other | Admitting: Internal Medicine

## 2017-06-06 ENCOUNTER — Telehealth (HOSPITAL_COMMUNITY): Payer: Self-pay

## 2017-06-06 NOTE — Telephone Encounter (Signed)
Encounter complete. 

## 2017-06-11 ENCOUNTER — Other Ambulatory Visit: Payer: Self-pay | Admitting: Pharmacist

## 2017-06-11 ENCOUNTER — Ambulatory Visit (HOSPITAL_COMMUNITY)
Admission: RE | Admit: 2017-06-11 | Discharge: 2017-06-11 | Disposition: A | Discharge status: Home / Self Care | Payer: Medicaid Other | Source: Ambulatory Visit | Attending: Cardiology | Admitting: Cardiology

## 2017-06-11 DIAGNOSIS — R5383 Other fatigue: Secondary | ICD-10-CM

## 2017-06-11 DIAGNOSIS — Z8249 Family history of ischemic heart disease and other diseases of the circulatory system: Secondary | ICD-10-CM | POA: Diagnosis not present

## 2017-06-11 DIAGNOSIS — R0602 Shortness of breath: Secondary | ICD-10-CM | POA: Diagnosis not present

## 2017-06-11 DIAGNOSIS — E785 Hyperlipidemia, unspecified: Secondary | ICD-10-CM | POA: Insufficient documentation

## 2017-06-11 DIAGNOSIS — J449 Chronic obstructive pulmonary disease, unspecified: Secondary | ICD-10-CM | POA: Insufficient documentation

## 2017-06-11 DIAGNOSIS — I10 Essential (primary) hypertension: Secondary | ICD-10-CM | POA: Diagnosis not present

## 2017-06-11 DIAGNOSIS — E7800 Pure hypercholesterolemia, unspecified: Secondary | ICD-10-CM

## 2017-06-11 DIAGNOSIS — F17200 Nicotine dependence, unspecified, uncomplicated: Secondary | ICD-10-CM | POA: Diagnosis not present

## 2017-06-11 DIAGNOSIS — I2510 Atherosclerotic heart disease of native coronary artery without angina pectoris: Secondary | ICD-10-CM | POA: Insufficient documentation

## 2017-06-11 DIAGNOSIS — I2584 Coronary atherosclerosis due to calcified coronary lesion: Secondary | ICD-10-CM | POA: Diagnosis not present

## 2017-06-11 LAB — MYOCARDIAL PERFUSION IMAGING
CHL CUP RESTING HR STRESS: 63 {beats}/min
LV sys vol: 22 mL
LVDIAVOL: 80 mL (ref 46–106)
NUC STRESS TID: 1.11
Peak HR: 83 {beats}/min
SDS: 0
SRS: 5
SSS: 5

## 2017-06-11 MED ORDER — REGADENOSON 0.4 MG/5ML IV SOLN
0.4000 mg | Freq: Once | INTRAVENOUS | Status: AC
  Administered 2017-06-11: 0.4 mg via INTRAVENOUS

## 2017-06-11 MED ORDER — ATORVASTATIN CALCIUM 20 MG PO TABS: ORAL_TABLET | ORAL | 0 refills | Status: DC

## 2017-06-11 MED ORDER — TECHNETIUM TC 99M TETROFOSMIN IV KIT
8.6000 | PACK | Freq: Once | INTRAVENOUS | Status: AC | PRN
  Administered 2017-06-11: 8.6 via INTRAVENOUS
  Filled 2017-06-11: qty 9

## 2017-06-11 MED ORDER — TECHNETIUM TC 99M TETROFOSMIN IV KIT
25.2000 | PACK | Freq: Once | INTRAVENOUS | Status: AC | PRN
  Administered 2017-06-11: 25.2 via INTRAVENOUS
  Filled 2017-06-11: qty 26

## 2017-06-11 MED ORDER — AMINOPHYLLINE 25 MG/ML IV SOLN
75.0000 mg | Freq: Once | INTRAVENOUS | Status: AC
  Administered 2017-06-11: 75 mg via INTRAVENOUS

## 2017-06-20 ENCOUNTER — Other Ambulatory Visit: Payer: Self-pay

## 2017-06-20 ENCOUNTER — Encounter: Payer: Medicaid Other | Attending: Physical Medicine & Rehabilitation | Admitting: Physical Medicine & Rehabilitation

## 2017-06-20 ENCOUNTER — Encounter: Payer: Self-pay | Admitting: Physical Medicine & Rehabilitation

## 2017-06-20 VITALS — BP 106/67 | HR 67

## 2017-06-20 DIAGNOSIS — I10 Essential (primary) hypertension: Secondary | ICD-10-CM | POA: Diagnosis not present

## 2017-06-20 DIAGNOSIS — R918 Other nonspecific abnormal finding of lung field: Secondary | ICD-10-CM

## 2017-06-20 DIAGNOSIS — F329 Major depressive disorder, single episode, unspecified: Secondary | ICD-10-CM | POA: Insufficient documentation

## 2017-06-20 DIAGNOSIS — F17210 Nicotine dependence, cigarettes, uncomplicated: Secondary | ICD-10-CM | POA: Insufficient documentation

## 2017-06-20 DIAGNOSIS — R269 Unspecified abnormalities of gait and mobility: Secondary | ICD-10-CM

## 2017-06-20 DIAGNOSIS — K7460 Unspecified cirrhosis of liver: Secondary | ICD-10-CM | POA: Insufficient documentation

## 2017-06-20 DIAGNOSIS — M797 Fibromyalgia: Secondary | ICD-10-CM | POA: Diagnosis not present

## 2017-06-20 DIAGNOSIS — G8929 Other chronic pain: Secondary | ICD-10-CM | POA: Diagnosis not present

## 2017-06-20 DIAGNOSIS — M25562 Pain in left knee: Secondary | ICD-10-CM | POA: Insufficient documentation

## 2017-06-20 DIAGNOSIS — Z720 Tobacco use: Secondary | ICD-10-CM

## 2017-06-20 NOTE — Progress Notes (Signed)
Subjective:    Patient ID: Raven Torres, female    DOB: 04/13/1956, 61 y.o.   MRN: [id number]  HPI 61 y/o female with pmh of cirrhosis, HTN, depression, OA, fibromyalgia, TB presents for follow up of left knee pain.   Initially stated: Started ~08/2016.  Pt stood up from sitting up and felt pop in her knees.  Getting better.  Being careful helps the pain.  Squats and being on knees exacerbates.  Achy.  Non-radiating.  Intermittent.  Denies associated weakness, numbness, tingling. Denies falls.  Pain does not limit activities.   Last clinic visit 05/09/17.  Since last visit, she is tolerating Gabapentin.  She is still awaiting follow up for lung nodules. She still has not followed up with pool therapy. She states she is stretching at home.  She did not obtain OTC lidoderm patch due to cost. She has decreased her smoking to 7-8/day. Denies falls.   Pain Inventory Average Pain 7 Pain Right Now 7 My pain is intermittent, stabbing and aching  In the last 24 hours, has pain interfered with the following? General activity 7 Relation with others 0 Enjoyment of life 0 What TIME of day is your pain at its worst? evening and night, night Sleep (in general) Good  Pain is worse with: walking, bending and sitting Pain improves with: heat/ice and medication Relief from Meds: 6  Mobility walk without assistance how many minutes can you walk? varies ability to climb steps?  yes do you drive?  yes Do you have any goals in this area?  yes  Function retired I need assistance with the following:  . Do you have any goals in this area?  no  Neuro/Psych bladder control problems bowel control problems weakness numbness tingling trouble walking spasms anxiety  Prior Studies Any changes since last visit?  yes  Physicians involved in your care Any changes since last visit?  no   Family History  Problem Relation Age of Onset  . Heart disease Mother   . Cancer Mother    Social  History   Socioeconomic History  . Marital status: Married    Spouse name: None  . Number of children: None  . Years of education: None  . Highest education level: None  Social Needs  . Financial resource strain: None  . Food insecurity - worry: None  . Food insecurity - inability: None  . Transportation needs - medical: None  . Transportation needs - non-medical: None  Occupational History  . None  Tobacco Use  . Smoking status: Current Every Day Smoker    Packs/day: 0.50    Types: Cigarettes  . Smokeless tobacco: Never Used  . Tobacco comment: smoking 10 cigs daily  Substance and Sexual Activity  . Alcohol use: No    Alcohol/week: 0.0 oz  . Drug use: No  . Sexual activity: No  Other Topics Concern  . None  Social History Narrative  . None   Past Surgical History:  Procedure Laterality Date  . ABDOMINAL HYSTERECTOMY    . KNEE SURGERY Right    Past Medical History:  Diagnosis Date  . Arthritis   . Colitis   . Hepatitis C   . TB (tuberculosis)    BP 106/67   Pulse 67   SpO2 93%   Opioid Risk Score:   Fall Risk Score:  `1  Depression screen PHQ 2/9  Depression screen [facility] 2/9 06/20/2017 05/15/2017 04/14/2017 12/26/2016 12/17/2016 11/19/2016 02/26/2016  Decreased Interest 0 0 3  1 1 1 1   Down, Depressed, Hopeless 0 (No Data) 3 0 0 1 0  PHQ - 2 Score 0 0 6 1 1 2 1   Altered sleeping - 2 3 1  0 2 -  Tired, decreased energy - 2 1 1  0 1 -  Change in appetite - 1 1 1 1 2  -  Feeling bad or failure about yourself  - 0 0 0 0 0 -  Trouble concentrating - 1 0 0 0 0 -  Moving slowly or fidgety/restless - 0 0 0 0 0 -  Suicidal thoughts - 0 0 0 0 0 -  PHQ-9 Score - 6 11 4 2 7  -  Difficult doing work/chores - - - Somewhat difficult - - -  Some recent data might be hidden   Review of Systems  Constitutional: Positive for unexpected weight change.       Easy bleeding  HENT: Negative.   Eyes: Negative.   Respiratory: Positive for shortness of breath and wheezing.     Cardiovascular: Negative.   Gastrointestinal: Positive for diarrhea.  Endocrine: Negative.   Genitourinary: Positive for difficulty urinating.  Musculoskeletal: Positive for arthralgias, back pain, gait problem, myalgias and neck pain.       Spasms  Skin: Positive for rash.  Allergic/Immunologic: Negative.   Neurological: Positive for dizziness and weakness.       Tingling  Hematological: Negative.   Psychiatric/Behavioral: Negative.   All other systems reviewed and are negative.      Objective:   Physical Exam Gen: NAD. Vital signs reviewed HENT: Normocephalic, Atraumatic Eyes: EOMI. No discharge.  Cardio: RRR. No JVD. Pulm: B/l clear to auscultation.  Effort normal Abd: Soft, BS+ MSK:  Gait mildly antalgic.   No TTP left knee.    No edema  Neuro:     Strength  4+/5 in all LE myotomes Skin: Warm and Dry. Intact    Assessment & Plan:  61 y/o female with pmh of cirrhosis, HTN, depression, OA, TB, fibromyalgia presents for follow up of left knee pain.    1. Left knee pain  Xray from 11/2016 reviewed, unremarkable  Will not prescribe Cymbalta due to liver issues  States she cannot afford OTC lidoderm patch  Cont Heat  Cont Voltaren gel  Cont Gabapentin 300 daily, side effects with TID dosing.  Does not want to increase dose.    Cont Flexaril qhs PRN, does not need to take much at present  Hold off on TENS due to lung nodules   Cont Bracing PRN, pt has at home  Pt to follow up with pool therapy, states she has a lot of medical issues going at present, still has not followed up  Chronic pain recently exaggerated by passing of husband, still not interested in seeing Psychology  She is apprehensive about any management until follow with Cards/Pulm  2. Gait abnormality  Does not want assistive device at present  3. Tobacco abuse  Smoking 7-8/day  Encouraged abstinence  4. Lung nodules  Hold TENS  Follow up with Pulm, appointment next week  5. CAD  Follow up  Cards  Avoid NSAIDs  6. TB  Cont meds

## 2017-06-24 ENCOUNTER — Ambulatory Visit: Payer: Medicaid Other | Admitting: Internal Medicine

## 2017-06-24 ENCOUNTER — Encounter: Payer: Self-pay | Admitting: Internal Medicine

## 2017-06-24 VITALS — BP 118/69 | HR 72 | Ht 67.0 in | Wt 189.2 lb

## 2017-06-24 DIAGNOSIS — E785 Hyperlipidemia, unspecified: Secondary | ICD-10-CM

## 2017-06-24 DIAGNOSIS — I2510 Atherosclerotic heart disease of native coronary artery without angina pectoris: Secondary | ICD-10-CM | POA: Diagnosis not present

## 2017-06-24 DIAGNOSIS — E7800 Pure hypercholesterolemia, unspecified: Secondary | ICD-10-CM | POA: Insufficient documentation

## 2017-06-24 DIAGNOSIS — I2584 Coronary atherosclerosis due to calcified coronary lesion: Secondary | ICD-10-CM

## 2017-06-24 DIAGNOSIS — R0602 Shortness of breath: Secondary | ICD-10-CM

## 2017-06-24 MED ORDER — ATORVASTATIN CALCIUM 40 MG PO TABS: 40.0000 mg | ORAL_TABLET | Freq: Every day | ORAL | 3 refills | Status: DC

## 2017-06-24 NOTE — Progress Notes (Signed)
OFFICE CONSULT NOTE  Chief Complaint:  Follow-up stress test  Primary Care Physician: Ladell Pier, MD  HPI:  Raven Torres is a 61 y.o. female who is being seen today for the evaluation of coronary artery calcification at the request of Ladell Pier, MD.  This is a pleasant 61 year old female who was referred to me for evaluation of coronary artery calcification that was noted on recent CT scanning.  She has an extensive medical history including hepatitis C, latent tuberculosis on isoniazid, COPD, and ongoing tobacco abuse for more than 40 years, now reduced to 20 packs/day.  There is an extensive family history of coronary disease as well.  She reports recently worsening shortness of breath but denies any chest pain or pressure with exertion.  She also says that she has been more fatigued than usual.  Other medical problems include peripheral neuropathy, dyslipidemia and hypertension.  With regards to her shortness of breath it is mostly exertional, worse when walking up stairs or doing certain activities.  This is not associated with any wheezing.  She was recently started on Spiriva with some improvement.  06/24/2017  Raven Torres returns today for follow-up.  She underwent nuclear stress testing which was a Lexicographer.  This was negative for ischemia and showed normal LV function with an EF of 72%.  She reports some occasional shortness of breath and chest discomfort which is worse with breathing.  I think this is related to her lung disease.  She does have multivessel coronary artery calcification.  Based on that she needs intensive risk factor modification and cholesterol lowering.  Her most recent lipid profile showed an LDL of 82 on atorvastatin 20 mg daily.  Given her coronary artery disease, she should be on a high intensity statin, therefore would recommend increasing atorvastatin to 40 mg daily.   PMHx:  Past Medical History:  Diagnosis Date  . Arthritis   .  Colitis   . Hepatitis C   . TB (tuberculosis)     Past Surgical History:  Procedure Laterality Date  . ABDOMINAL HYSTERECTOMY    . KNEE SURGERY Right     FAMHx:  Family History  Problem Relation Age of Onset  . Heart disease Mother   . Cancer Mother     SOCHx:   reports that she has been smoking cigarettes.  She has been smoking about 0.50 packs per day. she has never used smokeless tobacco. She reports that she does not drink alcohol or use drugs.  ALLERGIES:  Allergies  Allergen Reactions  . Amitriptyline Other (See Comments)    Eye spasms per pt  . Codeine Nausea Only  . Tramadol Nausea Only    ROS: Pertinent items noted in HPI and remainder of comprehensive ROS otherwise negative.  HOME MEDS: Current Outpatient Medications on File Prior to Visit  Medication Sig Dispense Refill  . albuterol (PROAIR HFA) 108 (90 Base) MCG/ACT inhaler INHALE 2 PUFFS INTO THE LUNGS EVERY 6 HOURS AS NEEDED FOR WHEEZING OR SHORTNESS OF BREATH 8.5 g 0  . aspirin EC 81 MG tablet Take 1 tablet (81 mg total) by mouth daily. 90 tablet 3  . atorvastatin (LIPITOR) 20 MG tablet TAKE 1 TABLET(20 MG) BY MOUTH DAILY 90 tablet 0  . CALCIUM CARBONATE-VIT D-MIN PO Take 1,200 mg by mouth.    . Cholecalciferol (VITAMIN D3) 1000 units CAPS Take by mouth.    . cyclobenzaprine (FLEXERIL) 10 MG tablet Take 1 tablet (10 mg total) by mouth 3 (  three) times daily as needed for muscle spasms. 30 tablet 3  . diclofenac sodium (VOLTAREN) 1 % GEL Apply 2 g topically 4 (four) times daily. 1 Tube 2  . gabapentin (NEURONTIN) 300 MG capsule Take 1 capsule (300 mg total) by mouth daily. 90 capsule 3  . hydroxychloroquine (PLAQUENIL) 200 MG tablet Take 200 mg by mouth 2 (two) times daily.    Marland Kitchen isoniazid (NYDRAZID) 300 MG tablet Take by mouth daily.    Marland Kitchen lisinopril-hydrochlorothiazide (PRINZIDE,ZESTORETIC) 10-12.5 MG tablet Take 1 tablet by mouth daily. 90 tablet 3  . nystatin cream (MYCOSTATIN) Apply 1 application  topically 2 (two) times daily. 30 g 0  . pyridoxine (B-6) 100 MG tablet Take by mouth.    . tiotropium (SPIRIVA HANDIHALER) 18 MCG inhalation capsule Place 1 capsule (18 mcg total) into inhaler and inhale daily. 30 capsule 12   No current facility-administered medications on file prior to visit.     LABS/IMAGING: No results found for this or any previous visit (from the past 48 hour(s)). No results found.  LIPID PANEL:    Component Value Date/Time   CHOL 184 11/19/2016 1223   TRIG 165 (H) 11/19/2016 1223   HDL 69 11/19/2016 1223   CHOLHDL 2.7 11/19/2016 1223   CHOLHDL 2.5 02/26/2016 1132   VLDL 28 02/26/2016 1132   LDLCALC 82 11/19/2016 1223    WEIGHTS: Wt Readings from Last 3 Encounters:  06/24/17 189 lb 3.2 oz (85.8 kg)  06/11/17 187 lb (84.8 kg)  06/03/17 187 lb (84.8 kg)    VITALS: BP 118/69   Pulse 72   Ht 5\' 7"  (1.702 m)   Wt 189 lb 3.2 oz (85.8 kg)   SpO2 94%   BMI 29.63 kg/m   EXAM: Deferred  EKG: Deferred  ASSESSMENT: 1. Progressive dyspnea on exertion -multivessel CAC on CT scan 2. COPD 3. Latent tuberculosis on isoniazid 4. History of hepatitis C 5. Family history of coronary disease 6. Ongoing tobacco use (approximately 90-pack-year smoking history)  PLAN: 1.   Raven Torres had a low risk Myoview which is reassuring.  She does have multivessel coronary artery calcification and needs aggressive risk factor modification including smoking cessation and better lipid control.  I advised a high intensity statin and will increase her Lipitor to 40 mg daily.  We will repeat a lipid profile in 3 months.  Follow-up with me in 6 months.  Pixie Casino, MD, [facility], [place] Director of the [facility]  [facility] Attending Cardiologist  Direct Dial: [phone]  Fax: [phone]  Website:  [email or web address] 06/24/2017, 10:49 AM

## 2017-06-24 NOTE — Patient Instructions (Signed)
Medication Instructions:  INCREASE atoravastatin (Lipitor) to 40 mg daily  Labwork: Please return for FASTING labs in 3 months (Lipid)-lab slip provided.  Follow-Up: Your physician wants you to follow-up in: 6 months with Dr. Debara Pickett. You will receive a reminder letter in the mail two months in advance. If you don't receive a letter, please call our office to schedule the follow-up appointment.   Any Other Special Instructions Will Be Listed Below (If Applicable).     If you need a refill on your cardiac medications before your next appointment, please call your pharmacy.

## 2017-07-17 ENCOUNTER — Ambulatory Visit: Payer: Medicaid Other | Admitting: Internal Medicine

## 2017-07-22 ENCOUNTER — Encounter: Payer: Self-pay | Admitting: Internal Medicine

## 2017-07-22 ENCOUNTER — Ambulatory Visit: Payer: Medicaid Other | Attending: Internal Medicine | Admitting: Internal Medicine

## 2017-07-22 VITALS — BP 151/71 | HR 71 | Temp 97.5°F | Resp 16 | Wt 189.4 lb

## 2017-07-22 DIAGNOSIS — J449 Chronic obstructive pulmonary disease, unspecified: Secondary | ICD-10-CM | POA: Insufficient documentation

## 2017-07-22 DIAGNOSIS — I7090 Unspecified atherosclerosis: Secondary | ICD-10-CM

## 2017-07-22 DIAGNOSIS — I708 Atherosclerosis of other arteries: Secondary | ICD-10-CM

## 2017-07-22 DIAGNOSIS — M069 Rheumatoid arthritis, unspecified: Secondary | ICD-10-CM | POA: Diagnosis not present

## 2017-07-22 DIAGNOSIS — R918 Other nonspecific abnormal finding of lung field: Secondary | ICD-10-CM | POA: Diagnosis not present

## 2017-07-22 DIAGNOSIS — E785 Hyperlipidemia, unspecified: Secondary | ICD-10-CM | POA: Insufficient documentation

## 2017-07-22 DIAGNOSIS — I2510 Atherosclerotic heart disease of native coronary artery without angina pectoris: Secondary | ICD-10-CM | POA: Insufficient documentation

## 2017-07-22 DIAGNOSIS — B182 Chronic viral hepatitis C: Secondary | ICD-10-CM | POA: Diagnosis not present

## 2017-07-22 DIAGNOSIS — R413 Other amnesia: Secondary | ICD-10-CM

## 2017-07-22 DIAGNOSIS — Z79899 Other long term (current) drug therapy: Secondary | ICD-10-CM | POA: Diagnosis not present

## 2017-07-22 DIAGNOSIS — Z7982 Long term (current) use of aspirin: Secondary | ICD-10-CM | POA: Insufficient documentation

## 2017-07-22 DIAGNOSIS — E7800 Pure hypercholesterolemia, unspecified: Secondary | ICD-10-CM | POA: Diagnosis not present

## 2017-07-22 DIAGNOSIS — F17210 Nicotine dependence, cigarettes, uncomplicated: Secondary | ICD-10-CM | POA: Insufficient documentation

## 2017-07-22 DIAGNOSIS — I10 Essential (primary) hypertension: Secondary | ICD-10-CM | POA: Diagnosis not present

## 2017-07-22 DIAGNOSIS — R7611 Nonspecific reaction to tuberculin skin test without active tuberculosis: Secondary | ICD-10-CM | POA: Diagnosis not present

## 2017-07-22 DIAGNOSIS — K7460 Unspecified cirrhosis of liver: Secondary | ICD-10-CM | POA: Diagnosis not present

## 2017-07-22 DIAGNOSIS — F4320 Adjustment disorder, unspecified: Secondary | ICD-10-CM | POA: Diagnosis not present

## 2017-07-22 DIAGNOSIS — M797 Fibromyalgia: Secondary | ICD-10-CM | POA: Diagnosis not present

## 2017-07-22 DIAGNOSIS — Z227 Latent tuberculosis: Secondary | ICD-10-CM

## 2017-07-22 NOTE — Progress Notes (Signed)
Patient ID: Raven Torres, female    DOB: [birth year]  MRN: [id number]  CC: Follow-up   Subjective: Raven Torres is a 61 y.o. 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 Lung Nodules/COPD:  -saw Dr. Vaughan Browner since last visit.  He thinks lung nodules may be due to rheumatoid arthritis but still has to follow-up on them given history of tobacco dependence.  Would like for her to have a repeat CT 6 months from her last one which will be sometime in March of next year. -Down to 5 cig/day.  Still trying to quit. -Spiriva helps a lot with her breathing.  2. Coronary calcification: Saw cardiology and had nuclear stress test which was negative for reversible ischemia.  Aggressive risk management recommended.  Lipitor increased to 40 mg. -Tolerating Lipitor.  No chest pains. . 3.  Latent TB: has f/u with ID at [facility] next mth. She has completed 6 mths of INH.  4.  HTN:  Took lisinopril/HCTZ already today.  She has not checked BP lately. Limit salt in food.  5.  Memory: She is agreeable to doing the MMSE  Today.  she feels memory is better now that she takes  Gabapentin only once a day instead of several times a day.   GF and uncle had Alzheimer's dementia -She is independent in ADLs.  She drives.  6. Strong smelling urine. No dysuria or pressure. Wonders if it is her liver.  -LFTs done through [facility] 05/2017 were normal.  Patient Active Problem List   Diagnosis Date Noted  . High cholesterol 06/24/2017  . Shortness of breath 06/04/2017  . Coronary artery calcification 06/04/2017  . Other fatigue 06/04/2017  . Centrilobular emphysema ([place]) 05/15/2017  . Atherosclerosis 05/15/2017  . Memory change 05/15/2017  . TB  lung, latent 04/16/2017  . Lung nodules 04/16/2017  . Diabetes mellitus screening 11/19/2016  . Fibromyalgia 05/15/2015  . Rheumatoid arthritis ([place]) 08/22/2014  . HTN (hypertension) 08/22/2014  . Tobacco abuse 08/22/2014  . Hepatic cirrhosis ([place]) 05/31/2014  . Chronic hepatitis C virus infection ([place]) 05/03/2014     Current Outpatient Medications on File Prior to Visit  Medication Sig Dispense Refill  . albuterol (PROAIR HFA) 108 (90 Base) MCG/ACT inhaler INHALE 2 PUFFS INTO THE LUNGS EVERY 6 HOURS AS NEEDED FOR WHEEZING OR SHORTNESS OF BREATH 8.5 g 0  . aspirin EC 81 MG tablet Take 1 tablet (81 mg total) by mouth daily. 90 tablet 3  . atorvastatin (LIPITOR) 40 MG tablet Take 1 tablet (40 mg total) by mouth daily at 6 PM. 90 tablet 3  . CALCIUM CARBONATE-VIT D-MIN PO Take 1,200 mg by mouth.    . Cholecalciferol (VITAMIN D3) 1000 units CAPS Take by mouth.    . cyclobenzaprine (FLEXERIL) 10 MG tablet Take 1 tablet (10 mg total) by mouth 3 (three) times daily as needed for muscle spasms. 30 tablet 3  . diclofenac sodium (VOLTAREN) 1 % GEL Apply 2 g topically 4 (four) times daily. 1 Tube 2  . gabapentin (NEURONTIN) 300 MG capsule Take 1 capsule (300 mg total) by mouth daily. 90 capsule 3  . hydroxychloroquine (PLAQUENIL) 200 MG tablet Take 200 mg by mouth 2 (two) times daily.    Marland Kitchen isoniazid (NYDRAZID) 300 MG tablet Take by  mouth daily.    Marland Kitchen lisinopril-hydrochlorothiazide (PRINZIDE,ZESTORETIC) 10-12.5 MG tablet Take 1 tablet by mouth daily. 90 tablet 3  . nystatin cream (MYCOSTATIN) Apply 1 application topically 2 (two) times daily. 30 g 0  . pyridoxine (B-6) 100 MG tablet Take by mouth.    . tiotropium (SPIRIVA HANDIHALER) 18 MCG inhalation capsule Place 1 capsule (18 mcg total) into inhaler and inhale daily. 30 capsule 12   No current facility-administered medications on file prior to visit.     Allergies  Allergen Reactions  . Amitriptyline Other (See Comments)    Eye spasms per pt    . Codeine Nausea Only  . Tramadol Nausea Only    Social History   Socioeconomic History  . Marital status: Married    Spouse name: Not on file  . Number of children: Not on file  . Years of education: Not on file  . Highest education level: Not on file  Social Needs  . Financial resource strain: Not on file  . Food insecurity - worry: Not on file  . Food insecurity - inability: Not on file  . Transportation needs - medical: Not on file  . Transportation needs - non-medical: Not on file  Occupational History  . Not on file  Tobacco Use  . Smoking status: Current Every Day Smoker    Packs/day: 0.50    Types: Cigarettes  . Smokeless tobacco: Never Used  . Tobacco comment: smoking 10 cigs daily  Substance and Sexual Activity  . Alcohol use: No    Alcohol/week: 0.0 oz  . Drug use: No  . Sexual activity: No  Other Topics Concern  . Not on file  Social History Narrative  . Not on file    Family History  Problem Relation Age of Onset  . Heart disease Mother   . Cancer Mother     Past Surgical History:  Procedure Laterality Date  . ABDOMINAL HYSTERECTOMY    . KNEE SURGERY Right     ROS: Review of Systems Negative except as stated above. PHYSICAL EXAM: BP (!) 151/71   Pulse 71   Temp (!) 97.5 F (36.4 C) (Oral)   Resp 16   Wt 189 lb 6.4 oz (85.9 kg)   SpO2 94%   BMI 29.66 kg/m   Wt Readings from Last 3 Encounters:  07/22/17 189 lb 6.4 oz (85.9 kg)  06/24/17 189 lb 3.2 oz (85.8 kg)  06/11/17 187 lb (84.8 kg)    Physical Exam  General appearance - alert, well appearing, elderly Caucasian female who looks older than stated age and in no distress Mental status - alert, oriented to person, place, and time, normal mood, behavior, speech, dress, motor activity, and thought processes Neck - supple, no significant adenopathy Chest -breath sounds decreased slightly bilaterally.  No wheezes or crackles heard. Heart - normal rate, regular rhythm, normal S1, S2,  no murmurs, rubs, clicks or gallops Extremities -no lower extremity edema.  Lab Results  Component Value Date   CHOL 184 11/19/2016   HDL 69 11/19/2016   LDLCALC 82 11/19/2016   TRIG 165 (H) 11/19/2016   CHOLHDL 2.7 11/19/2016    ASSESSMENT AND PLAN: 1. Lung nodules -Pulmonary evaluation appreciated.  Patient will have repeat CT in the spring.  2. Atherosclerosis of arteries -Negative nuclear stress test. Continue to encourage smoking cessation.  Continue Lipitor.  3. TB lung, latent Completed 6 months of INH.  4. Essential hypertension At goal.  5. Memory change Patient scored 29 on  MMSE.  We will continue to monitor her memory status over time  Patient was given the opportunity to ask questions.  Patient verbalized understanding of the plan and was able to repeat key elements of the plan.   No orders of the defined types were placed in this encounter.    Requested Prescriptions    No prescriptions requested or ordered in this encounter    F/u in 3 mths Karle Plumber, MD, Rosalita Chessman

## 2017-08-11 ENCOUNTER — Other Ambulatory Visit: Payer: Self-pay | Admitting: Internal Medicine

## 2017-08-11 DIAGNOSIS — Z1231 Encounter for screening mammogram for malignant neoplasm of breast: Secondary | ICD-10-CM

## 2017-08-18 ENCOUNTER — Other Ambulatory Visit: Payer: Medicaid Other | Admitting: Internal Medicine

## 2017-08-25 ENCOUNTER — Encounter: Payer: Self-pay | Admitting: Internal Medicine

## 2017-08-25 ENCOUNTER — Ambulatory Visit: Payer: Medicaid Other | Attending: Internal Medicine | Admitting: Internal Medicine

## 2017-08-25 VITALS — BP 126/75 | HR 71 | Temp 97.4°F | Resp 16 | Ht 66.0 in | Wt 191.0 lb

## 2017-08-25 DIAGNOSIS — F4320 Adjustment disorder, unspecified: Secondary | ICD-10-CM | POA: Diagnosis not present

## 2017-08-25 DIAGNOSIS — M069 Rheumatoid arthritis, unspecified: Secondary | ICD-10-CM | POA: Diagnosis not present

## 2017-08-25 DIAGNOSIS — R918 Other nonspecific abnormal finding of lung field: Secondary | ICD-10-CM | POA: Diagnosis not present

## 2017-08-25 DIAGNOSIS — F17210 Nicotine dependence, cigarettes, uncomplicated: Secondary | ICD-10-CM | POA: Diagnosis not present

## 2017-08-25 DIAGNOSIS — Z7982 Long term (current) use of aspirin: Secondary | ICD-10-CM | POA: Diagnosis not present

## 2017-08-25 DIAGNOSIS — E7800 Pure hypercholesterolemia, unspecified: Secondary | ICD-10-CM | POA: Diagnosis not present

## 2017-08-25 DIAGNOSIS — Z90711 Acquired absence of uterus with remaining cervical stump: Secondary | ICD-10-CM | POA: Diagnosis not present

## 2017-08-25 DIAGNOSIS — I2510 Atherosclerotic heart disease of native coronary artery without angina pectoris: Secondary | ICD-10-CM | POA: Diagnosis not present

## 2017-08-25 DIAGNOSIS — R7611 Nonspecific reaction to tuberculin skin test without active tuberculosis: Secondary | ICD-10-CM | POA: Diagnosis not present

## 2017-08-25 DIAGNOSIS — M797 Fibromyalgia: Secondary | ICD-10-CM | POA: Diagnosis not present

## 2017-08-25 DIAGNOSIS — Z79899 Other long term (current) drug therapy: Secondary | ICD-10-CM | POA: Diagnosis not present

## 2017-08-25 DIAGNOSIS — B182 Chronic viral hepatitis C: Secondary | ICD-10-CM | POA: Diagnosis not present

## 2017-08-25 DIAGNOSIS — Z124 Encounter for screening for malignant neoplasm of cervix: Secondary | ICD-10-CM | POA: Diagnosis present

## 2017-08-25 DIAGNOSIS — I10 Essential (primary) hypertension: Secondary | ICD-10-CM | POA: Diagnosis not present

## 2017-08-25 DIAGNOSIS — N393 Stress incontinence (female) (male): Secondary | ICD-10-CM | POA: Diagnosis not present

## 2017-08-25 NOTE — Patient Instructions (Signed)

## 2017-08-25 NOTE — Progress Notes (Signed)
Patient ID: Raven Torres, female    DOB: 09-09-1955  MRN: [id number]  CC: Gynecologic Exam   Subjective: Raven Torres is a 62 y.o. female who presents for PAP. Her concerns today include:  62 year old female with history of HTN, cured hepatitis C with cirrhosis, adjustment disorder, HL, tobacco dependence, and rheumatoid arthritis/fibromyalgia followed by rheumatology at [facility], latent TB currently on INH, lung nodules   1. Pt states she received a letter in the mail from her insurance wishing her happy B-day and encouraging her to get pap and MMG done.  So she made this appt for PAP.  However, pt has hx of partial hysterectomy at age 84 for severe endometriosis. Ovaries left in place.  She is sure that cervix was removed Endorses some leakage with laughing or coughing Wears a pad all the time  2.. RA: saw her rheumatologist recently and SSZ 419 mg BID and Folic acid 1 mg daily were added to PLQ. He does not thing lung nodules due to RA because her RA is not advance and she does not have high titer and is under control    Patient Active Problem List   Diagnosis Date Noted  . High cholesterol 06/24/2017  . Shortness of breath 06/04/2017  . Coronary artery calcification 06/04/2017  . Other fatigue 06/04/2017  . Centrilobular emphysema ([place]) 05/15/2017  . Atherosclerosis 05/15/2017  . Memory change 05/15/2017  . TB lung, latent 04/16/2017  . Lung nodules 04/16/2017  . Diabetes mellitus screening 11/19/2016  . Fibromyalgia 05/15/2015  . Rheumatoid arthritis ([place]) 08/22/2014  . HTN (hypertension) 08/22/2014  . Tobacco abuse 08/22/2014  . Hepatic cirrhosis ([place]) 05/31/2014  . Chronic hepatitis C virus infection ([place]) 05/03/2014     Current Outpatient Medications on File Prior to Visit  Medication Sig Dispense Refill  . albuterol (PROAIR HFA) 108 (90 Base) MCG/ACT inhaler INHALE 2 PUFFS INTO THE LUNGS EVERY 6 HOURS AS NEEDED FOR WHEEZING OR SHORTNESS OF BREATH 8.5 g  0  . aspirin EC 81 MG tablet Take 1 tablet (81 mg total) by mouth daily. 90 tablet 3  . atorvastatin (LIPITOR) 40 MG tablet Take 1 tablet (40 mg total) by mouth daily at 6 PM. 90 tablet 3  . CALCIUM CARBONATE-VIT D-MIN PO Take 1,200 mg by mouth.    . Cholecalciferol (VITAMIN D3) 1000 units CAPS Take by mouth.    . cyclobenzaprine (FLEXERIL) 10 MG tablet Take 1 tablet (10 mg total) by mouth 3 (three) times daily as needed for muscle spasms. 30 tablet 3  . diclofenac sodium (VOLTAREN) 1 % GEL Apply 2 g topically 4 (four) times daily. 1 Tube 2  . gabapentin (NEURONTIN) 300 MG capsule Take 1 capsule (300 mg total) by mouth daily. 90 capsule 3  . hydroxychloroquine (PLAQUENIL) 200 MG tablet Take 200 mg by mouth 2 (two) times daily.    Marland Kitchen isoniazid (NYDRAZID) 300 MG tablet Take by mouth daily.    Marland Kitchen lisinopril-hydrochlorothiazide (PRINZIDE,ZESTORETIC) 10-12.5 MG tablet Take 1 tablet by mouth daily. 90 tablet 3  . nystatin cream (MYCOSTATIN) Apply 1 application topically 2 (two) times daily. 30 g 0  . pyridoxine (B-6) 100 MG tablet Take by mouth.    . tiotropium (SPIRIVA HANDIHALER) 18 MCG inhalation capsule Place 1 capsule (18 mcg total) into inhaler and inhale daily. 30 capsule 12   No current facility-administered medications on file prior to visit.     Allergies  Allergen Reactions  . Amitriptyline Other (See Comments)  Eye spasms per pt  . Codeine Nausea Only  . Tramadol Nausea Only    Social History   Socioeconomic History  . Marital status: Married    Spouse name: Not on file  . Number of children: Not on file  . Years of education: Not on file  . Highest education level: Not on file  Social Needs  . Financial resource strain: Not on file  . Food insecurity - worry: Not on file  . Food insecurity - inability: Not on file  . Transportation needs - medical: Not on file  . Transportation needs - non-medical: Not on file  Occupational History  . Not on file  Tobacco Use  .  Smoking status: Current Every Day Smoker    Packs/day: 0.50    Types: Cigarettes  . Smokeless tobacco: Never Used  . Tobacco comment: smoking 10 cigs daily  Substance and Sexual Activity  . Alcohol use: No    Alcohol/week: 0.0 oz  . Drug use: No  . Sexual activity: No  Other Topics Concern  . Not on file  Social History Narrative  . Not on file    Family History  Problem Relation Age of Onset  . Heart disease Mother   . Cancer Mother     Past Surgical History:  Procedure Laterality Date  . ABDOMINAL HYSTERECTOMY    . KNEE SURGERY Right     ROS: Review of Systems Neg except as above PHYSICAL EXAM: BP 126/75   Pulse 71   Temp (!) 97.4 F (36.3 C) (Oral)   Resp 16   Ht 5\' 6"  (1.676 m)   Wt 191 lb (86.6 kg)   SpO2 96%   BMI 30.83 kg/m   Physical Exam  General appearance - alert, well appearing, pleasant elderly female and in no distress Mental status - oriented to person and place  ASSESSMENT AND PLAN: 1. Stress incontinence, female Kegal's exercises discussed and encouraged She does not need pap given hx of partial hysterectomy  2. Rheumatoid arthritis involving multiple sites, unspecified rheumatoid factor presence ([place])   Patient was given the opportunity to ask questions.  Patient verbalized understanding of the plan and was able to repeat key elements of the plan.   No orders of the defined types were placed in this encounter.    Requested Prescriptions    No prescriptions requested or ordered in this encounter    No Follow-up on file.  Karle Plumber, MD, FACP

## 2017-08-28 ENCOUNTER — Ambulatory Visit
Admission: RE | Admit: 2017-08-28 | Discharge: 2017-08-28 | Disposition: A | Discharge status: Home / Self Care | Payer: Medicaid Other | Source: Ambulatory Visit | Attending: Internal Medicine | Admitting: Internal Medicine

## 2017-08-28 ENCOUNTER — Ambulatory Visit: Payer: Medicaid Other

## 2017-08-28 DIAGNOSIS — Z1231 Encounter for screening mammogram for malignant neoplasm of breast: Secondary | ICD-10-CM

## 2017-09-17 ENCOUNTER — Encounter: Payer: Medicaid Other | Admitting: Physical Medicine & Rehabilitation

## 2017-10-06 LAB — LIPID PANEL
Chol/HDL Ratio: 1.7 ratio (ref 0.0–4.4)
Cholesterol, Total: 136 mg/dL (ref 100–199)
HDL: 81 mg/dL (ref >39)
LDL Calculated: 40 mg/dL (ref 0–99)
Triglycerides: 73 mg/dL (ref 0–149)
VLDL CHOLESTEROL CAL: 15 mg/dL (ref 5–40)

## 2017-10-08 ENCOUNTER — Encounter: Payer: Self-pay | Admitting: Internal Medicine

## 2017-11-18 ENCOUNTER — Ambulatory Visit (INDEPENDENT_AMBULATORY_CARE_PROVIDER_SITE_OTHER)
Admission: RE | Admit: 2017-11-18 | Discharge: 2017-11-18 | Disposition: A | Discharge status: Home / Self Care | Payer: Medicaid Other | Source: Ambulatory Visit | Attending: Pulmonary Disease | Admitting: Pulmonary Disease

## 2017-11-18 DIAGNOSIS — R911 Solitary pulmonary nodule: Secondary | ICD-10-CM | POA: Diagnosis not present

## 2017-11-19 ENCOUNTER — Telehealth: Payer: Self-pay | Admitting: Pulmonary Disease

## 2017-11-19 DIAGNOSIS — R911 Solitary pulmonary nodule: Secondary | ICD-10-CM

## 2017-11-19 NOTE — Telephone Encounter (Signed)
Notes recorded by Marshell Garfinkel, MD on 11/19/2017 at 6:20 AM EDT The largest lung nodule has resolved but there are other new ones that we need to monitor Order follow up CT without contrast in 3 months  There is evidence of coronary artery plaque and cirrhosis. Please have her follow up with Dr. Wynetta Emery, primary care. ------------------------------------------ Spoke with pt. She is aware of results. Order was placed for repeat CT scan in 3 months. Nothing further was needed.

## 2017-11-27 ENCOUNTER — Encounter: Payer: Self-pay | Admitting: Internal Medicine

## 2017-11-27 ENCOUNTER — Ambulatory Visit: Payer: Medicaid Other | Attending: Internal Medicine | Admitting: Internal Medicine

## 2017-11-27 VITALS — BP 132/67 | HR 73 | Temp 98.0°F | Resp 16 | Wt 185.4 lb

## 2017-11-27 DIAGNOSIS — F17200 Nicotine dependence, unspecified, uncomplicated: Secondary | ICD-10-CM

## 2017-11-27 DIAGNOSIS — R918 Other nonspecific abnormal finding of lung field: Secondary | ICD-10-CM | POA: Diagnosis not present

## 2017-11-27 DIAGNOSIS — F17210 Nicotine dependence, cigarettes, uncomplicated: Secondary | ICD-10-CM | POA: Diagnosis not present

## 2017-11-27 DIAGNOSIS — M069 Rheumatoid arthritis, unspecified: Secondary | ICD-10-CM | POA: Diagnosis not present

## 2017-11-27 DIAGNOSIS — Z79899 Other long term (current) drug therapy: Secondary | ICD-10-CM | POA: Insufficient documentation

## 2017-11-27 DIAGNOSIS — Z7982 Long term (current) use of aspirin: Secondary | ICD-10-CM | POA: Insufficient documentation

## 2017-11-27 DIAGNOSIS — M25561 Pain in right knee: Secondary | ICD-10-CM | POA: Insufficient documentation

## 2017-11-27 DIAGNOSIS — I10 Essential (primary) hypertension: Secondary | ICD-10-CM | POA: Diagnosis not present

## 2017-11-27 NOTE — Patient Instructions (Signed)
Okay to use Advil for a few days for the knee pain. Avoid any furhter heavy lifting.

## 2017-11-27 NOTE — Progress Notes (Signed)
Patient ID: Raven Torres, female    DOB: [birth year]  MRN: [id number]  CC: Hypertension   Subjective: Raven Torres is a 62 y.o. female who presents for chronic ds manangement Her concerns today include:  62 year old female with history of HTN, cured hepatitis C with cirrhosis, adjustment disorder, HL, tobacco dependence, and rheumatoid arthritis/fibromyalgia followed by rheumatology at [facility], latent TB currently on INH, lung nodules   1.  Planning to move in with daughter and her kids in [place]. Husband died 1.5 yr.  She feels it will help her and she can help her daughter with child care.  2.  Tob dep: still smoking but thinks it will be less once she moves in with daughter because she will not be allowed to smoke in the house. Had repeat CAT scan of the chest ordered by pulmonologist Dr. Vaughan Browner.  Largest nodule that was seen had resolved.  However she has multiple new nodules that have developed.  Plan is for repeat CAT scan in 3 months.  He noted advance coronary artery atherosclerosis.  This was also noted on prior study.  Patient has seen cardiologist for this in the fall of last year and had stress test that was low risk.  3.  Hurt her RT knee yesterday lifting a heavy box. Uncomfortable sleeping last night and noticed some swelling in the knee this a.m. -used Voltaren gel and wraps the knee with an Ace wrap.  This has helped  4.  RA:  Doing well on PLQ and SSA.  Moving more with the weather warming up and eating less since she does not cook large meals as she did when spouse was alive.  Loss 6 lbs since last visit which she attributes to this  5.  HTN: compliant with Lis/HCTZ.  No chest pains  Patient Active Problem List   Diagnosis Date Noted  . High cholesterol 06/24/2017  . Shortness of breath 06/04/2017  . Coronary artery calcification 06/04/2017  . Other fatigue 06/04/2017  . Centrilobular emphysema ([place]) 05/15/2017  . Atherosclerosis 05/15/2017  .  Memory change 05/15/2017  . TB lung, latent 04/16/2017  . Lung nodules 04/16/2017  . Diabetes mellitus screening 11/19/2016  . Fibromyalgia 05/15/2015  . Rheumatoid arthritis ([place]) 08/22/2014  . HTN (hypertension) 08/22/2014  . Tobacco abuse 08/22/2014  . Hepatic cirrhosis ([place]) 05/31/2014  . Chronic hepatitis C virus infection ([place]) 05/03/2014     Current Outpatient Medications on File Prior to Visit  Medication Sig Dispense Refill  . albuterol (PROAIR HFA) 108 (90 Base) MCG/ACT inhaler INHALE 2 PUFFS INTO THE LUNGS EVERY 6 HOURS AS NEEDED FOR WHEEZING OR SHORTNESS OF BREATH 8.5 g 0  . aspirin EC 81 MG tablet Take 1 tablet (81 mg total) by mouth daily. 90 tablet 3  . atorvastatin (LIPITOR) 40 MG tablet Take 1 tablet (40 mg total) by mouth daily at 6 PM. 90 tablet 3  . CALCIUM CARBONATE-VIT D-MIN PO Take 1,200 mg by mouth.    . Cholecalciferol (VITAMIN D3) 1000 units CAPS Take by mouth.    . cyclobenzaprine (FLEXERIL) 10 MG tablet Take 1 tablet (10 mg total) by mouth 3 (three) times daily as needed for muscle spasms. 30 tablet 3  . diclofenac sodium (VOLTAREN) 1 % GEL Apply 2 g topically 4 (four) times daily. 1 Tube 2  . folic acid (FOLVITE) 1 MG tablet TK 1 T PO  D  3  . gabapentin (NEURONTIN) 300 MG capsule Take 1 capsule (300 mg  total) by mouth daily. 90 capsule 3  . hydroxychloroquine (PLAQUENIL) 200 MG tablet Take 200 mg by mouth 2 (two) times daily.    Marland Kitchen lisinopril-hydrochlorothiazide (PRINZIDE,ZESTORETIC) 10-12.5 MG tablet Take 1 tablet by mouth daily. 90 tablet 3  . nystatin cream (MYCOSTATIN) Apply 1 application topically 2 (two) times daily. 30 g 0  . sulfaSALAzine (AZULFIDINE) 500 MG EC tablet Take by mouth.    . tiotropium (SPIRIVA HANDIHALER) 18 MCG inhalation capsule Place 1 capsule (18 mcg total) into inhaler and inhale daily. 30 capsule 12   No current facility-administered medications on file prior to visit.     Allergies  Allergen Reactions  . Amitriptyline Other  (See Comments)    Eye spasms per pt  . Codeine Nausea Only  . Tramadol Nausea Only    Social History   Socioeconomic History  . Marital status: Married    Spouse name: Not on file  . Number of children: Not on file  . Years of education: Not on file  . Highest education level: Not on file  Occupational History  . Not on file  Social Needs  . Financial resource strain: Not on file  . Food insecurity:    Worry: Not on file    Inability: Not on file  . Transportation needs:    Medical: Not on file    Non-medical: Not on file  Tobacco Use  . Smoking status: Current Every Day Smoker    Packs/day: 0.50    Types: Cigarettes  . Smokeless tobacco: Never Used  . Tobacco comment: smoking 10 cigs daily  Substance and Sexual Activity  . Alcohol use: No    Alcohol/week: 0.0 oz  . Drug use: No  . Sexual activity: Never  Lifestyle  . Physical activity:    Days per week: Not on file    Minutes per session: Not on file  . Stress: Not on file  Relationships  . Social connections:    Talks on phone: Not on file    Gets together: Not on file    Attends religious service: Not on file    Active member of club or organization: Not on file    Attends meetings of clubs or organizations: Not on file    Relationship status: Not on file  . Intimate partner violence:    Fear of current or ex partner: Not on file    Emotionally abused: Not on file    Physically abused: Not on file    Forced sexual activity: Not on file  Other Topics Concern  . Not on file  Social History Narrative  . Not on file    Family History  Problem Relation Age of Onset  . Heart disease Mother   . Cancer Mother     Past Surgical History:  Procedure Laterality Date  . ABDOMINAL HYSTERECTOMY    . KNEE SURGERY Right     ROS: Review of Systems Negative except as above PHYSICAL EXAM: BP 132/67   Pulse 73   Temp 98 F (36.7 C) (Oral)   Resp 16   Wt 185 lb 6.4 oz (84.1 kg)   SpO2 94%   BMI 29.92  kg/m   Wt Readings from Last 3 Encounters:  11/27/17 185 lb 6.4 oz (84.1 kg)  08/25/17 191 lb (86.6 kg)  07/22/17 189 lb 6.4 oz (85.9 kg)    Physical Exam  General appearance - alert, well appearing, and in no distress Mental status - alert, oriented to person, place,  and time, normal mood, behavior, speech, dress, motor activity, and thought processes Neck - supple, no significant adenopathy Chest - clear to auscultation, no wheezes, rales or rhonchi, symmetric air entry Heart - normal rate, regular rhythm, normal S1, S2, no murmurs, rubs, clicks or gallops Extremities - peripheral pulses normal, no pedal edema, no clubbing or cyanosis MSK: Patient ambulates without assistive device.  Right knee: No edema or erythema.  No point tenderness.  Good range of motion.  Labs: Patient had recent labs done through her rheumatologist.  I reviewed these on care everywhere.  CBC revealed hemoglobin 15, creatinine 43.8 and platelet count of 167.  Chemistry revealed normal LFTs.  Estimated GFR was 78 with creatinine of 0.8  ASSESSMENT AND PLAN: 1. Essential hypertension At goal.  Continue lisinopril/HCTZ.  2. Lung nodules Follow-up per pulmonary with repeat imaging in 3 months.  3. Tobacco dependence Continue to encourage smoking cessation  4. Rheumatoid arthritis involving multiple sites, unspecified rheumatoid factor presence ([place]) Followed by rheumatology in [place].  Disease seems to be under good control on sulfasalazine and Plaquenil  5. Acute pain of right knee Okay to continue using Voltaren gel.  Advised to avoid any further heavy lifting   Patient was given the opportunity to ask questions.  Patient verbalized understanding of the plan and was able to repeat key elements of the plan.   No orders of the defined types were placed in this encounter.    Requested Prescriptions    No prescriptions requested or ordered in this encounter    Return in about 3 months (around  02/26/2018).  Karle Plumber, MD, FACP

## 2017-12-09 ENCOUNTER — Other Ambulatory Visit: Payer: Self-pay

## 2017-12-09 MED ORDER — LISINOPRIL-HYDROCHLOROTHIAZIDE 10-12.5 MG PO TABS: 1.0000 | ORAL_TABLET | Freq: Every day | ORAL | 1 refills | Status: DC

## 2018-02-11 ENCOUNTER — Telehealth: Payer: Self-pay | Admitting: Pulmonary Disease

## 2018-02-11 NOTE — Telephone Encounter (Signed)
Late Entry  .[employer]/ICON  Title: Blood Sample Collection in Subjects with Pulmonary Nodules or CT Suspicion of Lung  Cancer  Study Number: [id number]; Protocol: Version 5.0,  Date: [id number]  Sponsor: [employer] [address] [place] [address]  Principal Investigator: Dr. Marshell Garfinkel ; Sub Investigators: Dr. Brand Males, Dr. Simonne Maffucci  Synopsis: This is multi-site, sample collection study.The study is to obtain de-identified, clinically characterized, whole blood specimens for use in assessing new biomarkers for the detection of neoplasms off the lung.  The study will sample blood (35mL) from approximately 2250 subject; 1000 will have CT suspicion of lung cancer which is ultimately diagnosed as lung cancer and approximately 1000 subjects will have pulmonary nodules greater than or equal to 4 mm .   Clinical Research Coordinator note : This follow up chart review for Subject Raven Torres with DOB: 08-21-1955 on 02/06/2018  for the above protocol is Visit/Encounter 6 month follow up and is for purpose of research. The consent for this encounter is under Protocol Version 5.0 and  is currently IRB approved.   As per required by the above mentioned protocol the subject was due for their 6 month follow up assessment of present baseline lung nodules. This subject's chart has been reviewed and data collected was for the purpose of the study. For additional information on the subject encounter please refer to the subjects paper source binder.    Signed by  T. [employer] BS. Dent Coordinator [facility]  [place], [place] 12:37pm 02/11/2018

## 2018-02-18 ENCOUNTER — Ambulatory Visit (INDEPENDENT_AMBULATORY_CARE_PROVIDER_SITE_OTHER)
Admission: RE | Admit: 2018-02-18 | Discharge: 2018-02-18 | Disposition: A | Discharge status: Home / Self Care | Payer: Medicaid Other | Source: Ambulatory Visit | Attending: Pulmonary Disease | Admitting: Pulmonary Disease

## 2018-02-18 DIAGNOSIS — R911 Solitary pulmonary nodule: Secondary | ICD-10-CM | POA: Diagnosis not present

## 2018-03-02 ENCOUNTER — Telehealth: Payer: Self-pay | Admitting: Pulmonary Disease

## 2018-03-02 NOTE — Telephone Encounter (Signed)
Notes recorded by Marshell Garfinkel, MD on 02/26/2018 at 2:41 PM EDT Please let patient know CT shows multiple small lung nodules with appearence that suggests an inflammatory process. There is no evidence of malignancy We will continue to monitor this. In addition there is findings of cirrhosis, emphysema, coronary plaque buildup. Order CT chest without contrast in 6 months.  Called and spoke with pt letting her know the results of the CT scan. Pt expressed understanding. Pt did not have a f/u appt scheduled with Dr. Vaughan Browner so I scheduled pt an appt Monday, 8/12 at 3:45. Nothing further needed.

## 2018-03-11 ENCOUNTER — Other Ambulatory Visit: Payer: Self-pay

## 2018-03-11 MED ORDER — ASPIRIN EC 81 MG PO TBEC: 81.0000 mg | DELAYED_RELEASE_TABLET | Freq: Every day | ORAL | 0 refills | Status: DC

## 2018-03-16 ENCOUNTER — Encounter: Payer: Self-pay | Admitting: Pulmonary Disease

## 2018-03-16 ENCOUNTER — Ambulatory Visit: Payer: Medicaid Other | Admitting: Pulmonary Disease

## 2018-03-16 VITALS — BP 126/76 | HR 76 | Ht 65.0 in | Wt 172.6 lb

## 2018-03-16 DIAGNOSIS — R059 Cough, unspecified: Secondary | ICD-10-CM

## 2018-03-16 DIAGNOSIS — R05 Cough: Secondary | ICD-10-CM

## 2018-03-16 DIAGNOSIS — R918 Other nonspecific abnormal finding of lung field: Secondary | ICD-10-CM

## 2018-03-16 DIAGNOSIS — J438 Other emphysema: Secondary | ICD-10-CM

## 2018-03-16 DIAGNOSIS — R911 Solitary pulmonary nodule: Secondary | ICD-10-CM

## 2018-03-16 MED ORDER — ACLIDINIUM BROMIDE 400 MCG/ACT IN AEPB: 1.0000 | INHALATION_SPRAY | Freq: Every day | RESPIRATORY_TRACT | 5 refills | Status: DC

## 2018-03-16 MED ORDER — ACLIDINIUM BROMIDE 400 MCG/ACT IN AEPB: 1.0000 | INHALATION_SPRAY | Freq: Two times a day (BID) | RESPIRATORY_TRACT | 0 refills | Status: DC

## 2018-03-16 NOTE — Patient Instructions (Addendum)
We will schedule you for pulmonary function test for evaluation of the lung We will stop the Spiriva and start you on Tudorza inhaler Need a follow-up CT scan without contrast to monitor the lung nodules in about 5 months time Continue to work on smoking cessation. We will check sputum for AFB, regular cultures, fungal cultures  Follow-up after CT scan

## 2018-03-16 NOTE — Progress Notes (Signed)
Raven Torres    [id number]    1956-08-05  Primary Care Physician:Johnson, Dalbert Batman, MD  Referring Physician: Ladell Pier, MD [address] [place], [place] [address]  Chief complaint:  Follow up for lung nodules, emphysema  HPI: 62 year old with history of cirrhosis secondary to hepatitis C, hypertension, depression, arthritis, fibromyalgia, latent TB. She has history of rheumatoid arthritis being treated with Plaquenil and sulfasalazine. Also noted to have a positive QuantiFERON and finished INH therapy for latent TB on 2018.  She had a CT scan which showed multiple small pulmonary nodules and has been referred to [facility] for further evaluation. She has 40-pack-year smoking history and continues to smoke half pack per day.   Interim History: Here for follow-up of CT scan.  States that she has dyspnea with exertion, productive cough with white mucus, occasional wheezing that is unchanged from baseline Continues to smoke 1/2 pack/day.  She had an evaluation by cardiology for coronary atherosclerosis.  Cardiac stress test was low risk.  Outpatient Encounter Medications as of 03/16/2018  Medication Sig  . albuterol (PROAIR HFA) 108 (90 Base) MCG/ACT inhaler INHALE 2 PUFFS INTO THE LUNGS EVERY 6 HOURS AS NEEDED FOR WHEEZING OR SHORTNESS OF BREATH  . aspirin EC 81 MG tablet Take 1 tablet (81 mg total) by mouth daily.  Marland Kitchen atorvastatin (LIPITOR) 40 MG tablet Take 1 tablet (40 mg total) by mouth daily at 6 PM.  . CALCIUM CARBONATE-VIT D-MIN PO Take 1,200 mg by mouth.  . Cholecalciferol (VITAMIN D3) 1000 units CAPS Take by mouth.  . cyclobenzaprine (FLEXERIL) 10 MG tablet Take 1 tablet (10 mg total) by mouth 3 (three) times daily as needed for muscle spasms.  . diclofenac sodium (VOLTAREN) 1 % GEL Apply 2 g topically 4 (four) times daily.  . folic acid (FOLVITE) 1 MG tablet TK 1 T PO  D  . gabapentin (NEURONTIN) 300 MG capsule Take 1 capsule (300 mg total) by mouth daily.    . hydroxychloroquine (PLAQUENIL) 200 MG tablet Take 200 mg by mouth 2 (two) times daily.  Marland Kitchen lisinopril-hydrochlorothiazide (PRINZIDE,ZESTORETIC) 10-12.5 MG tablet Take 1 tablet by mouth daily.  Marland Kitchen nystatin cream (MYCOSTATIN) Apply 1 application topically 2 (two) times daily.  Marland Kitchen sulfaSALAzine (AZULFIDINE) 500 MG EC tablet Take by mouth.  . tiotropium (SPIRIVA HANDIHALER) 18 MCG inhalation capsule Place 1 capsule (18 mcg total) into inhaler and inhale daily.   No facility-administered encounter medications on file as of 03/16/2018.     Allergies as of 03/16/2018 - Review Complete 03/16/2018  Allergen Reaction Noted  . Amitriptyline Other (See Comments) 11/19/2016  . Codeine Nausea Only 01/27/2015  . Tramadol Nausea Only 01/27/2015    Past Medical History:  Diagnosis Date  . Arthritis   . Colitis   . Hepatitis C   . TB (tuberculosis)     Past Surgical History:  Procedure Laterality Date  . ABDOMINAL HYSTERECTOMY    . KNEE SURGERY Right     Family History  Problem Relation Age of Onset  . Heart disease Mother   . Cancer Mother     Social History   Socioeconomic History  . Marital status: Married    Spouse name: Not on file  . Number of children: Not on file  . Years of education: Not on file  . Highest education level: Not on file  Occupational History  . Not on file  Social Needs  . Financial resource strain: Not on  file  . Food insecurity:    Worry: Not on file    Inability: Not on file  . Transportation needs:    Medical: Not on file    Non-medical: Not on file  Tobacco Use  . Smoking status: Current Every Day Smoker    Packs/day: 0.50    Types: Cigarettes  . Smokeless tobacco: Never Used  . Tobacco comment: smoking 10 cigs daily  Substance and Sexual Activity  . Alcohol use: No    Alcohol/week: 0.0 standard drinks  . Drug use: No  . Sexual activity: Never  Lifestyle  . Physical activity:    Days per week: Not on file    Minutes per session: Not on  file  . Stress: Not on file  Relationships  . Social connections:    Talks on phone: Not on file    Gets together: Not on file    Attends religious service: Not on file    Active member of club or organization: Not on file    Attends meetings of clubs or organizations: Not on file    Relationship status: Not on file  . Intimate partner violence:    Fear of current or ex partner: Not on file    Emotionally abused: Not on file    Physically abused: Not on file    Forced sexual activity: Not on file  Other Topics Concern  . Not on file  Social History Narrative  . Not on file    Review of systems: Review of Systems  Constitutional: Negative for fever and chills.  HENT: Negative.   Eyes: Negative for blurred vision.  Respiratory: as per HPI  Cardiovascular: Negative for chest pain and palpitations.  Gastrointestinal: Negative for vomiting, diarrhea, blood per rectum. Genitourinary: Negative for dysuria, urgency, frequency and hematuria.  Musculoskeletal: Negative for myalgias, back pain and joint pain.  Skin: Negative for itching and rash.  Neurological: Negative for dizziness, tremors, focal weakness, seizures and loss of consciousness.  Endo/Heme/Allergies: Negative for environmental allergies.  Psychiatric/Behavioral: Negative for depression, suicidal ideas and hallucinations.  All other systems reviewed and are negative.  Physical Exam: There were no vitals taken for this visit. Gen:      No acute distress HEENT:  EOMI, sclera anicteric Neck:     No masses; no thyromegaly Lungs:    Clear to auscultation bilaterally; normal respiratory effort CV:         Regular rate and rhythm; no murmurs Abd:      + bowel sounds; soft, non-tender; no palpable masses, no distension Ext:    No edema; adequate peripheral perfusion Skin:      Warm and dry; no rash Neuro: alert and oriented x 3 Psych: normal mood and affect  Data Reviewed: CT scan 04/18/17-multiple subcentimeter  pulmonary nodule. The largest one is a left lower lobe, 1 cm in size. Diffuse emphysematous changes. Aortic, coronary atherosclerosis. CT 11/18/2017- dominant left lower lobe nodule has resolved.  Persistence of multiple subcentimeter pulmonary nodules with some new nodules CT 02/18/2018- subcentimeter pulmonary nodules in the right upper and middle lobe have improved while there was an increase in size and number in the lower lobes.  Moderate to severe emphysema. I had reviewed the images personally.  Labs: Alpha-1 antitrypsin 05/20/2017-162,  Cardiac stress test 08/11/2016 EF 72%, low risk study.  Assessment:  Pulmonary nodules Serial CT scan images reviewed with waxing and waning pattern of nodule suggestive of inflammatory process related to ongoing smoking or rheumatoid arthritis, though  RA related nodules as thought to be less likely as a rheumatoid factor and disease activity is low as per Dr. Posey Pronto, rheumatology She has been treated for latent TB in the past, suspicion for active TB is low.  We will continue to monitor this with follow-up CT in 6 months time Check sputum for cultures  Emphysema She likely has COPD secondary to smoking.  Still symptomatic on Spiriva.  Will give her Tudorza inhaler Schedule pulmonary function test  Active smoker Smoking cessation discussed. She wants to quit on her own.   Plan/Recommendations: - PFTs - Stop Spiriva, start Tudorza - CT chest without contrast in 5 months - Continue to work on smoking cessation - Check sputum for cultures including AFB  Marshell Garfinkel MD [facility] 03/16/2018, 3:56 PM  CC: Ladell Pier, MD

## 2018-03-19 ENCOUNTER — Ambulatory Visit: Payer: Medicaid Other | Attending: Internal Medicine | Admitting: Internal Medicine

## 2018-03-19 ENCOUNTER — Telehealth: Payer: Self-pay

## 2018-03-19 ENCOUNTER — Encounter: Payer: Self-pay | Admitting: Internal Medicine

## 2018-03-19 VITALS — BP 130/71 | HR 80 | Temp 97.7°F | Resp 16 | Wt 173.2 lb

## 2018-03-19 DIAGNOSIS — Z79899 Other long term (current) drug therapy: Secondary | ICD-10-CM | POA: Diagnosis not present

## 2018-03-19 DIAGNOSIS — E7800 Pure hypercholesterolemia, unspecified: Secondary | ICD-10-CM | POA: Diagnosis not present

## 2018-03-19 DIAGNOSIS — Z8619 Personal history of other infectious and parasitic diseases: Secondary | ICD-10-CM | POA: Insufficient documentation

## 2018-03-19 DIAGNOSIS — M069 Rheumatoid arthritis, unspecified: Secondary | ICD-10-CM | POA: Insufficient documentation

## 2018-03-19 DIAGNOSIS — Z7982 Long term (current) use of aspirin: Secondary | ICD-10-CM | POA: Diagnosis not present

## 2018-03-19 DIAGNOSIS — M797 Fibromyalgia: Secondary | ICD-10-CM | POA: Diagnosis not present

## 2018-03-19 DIAGNOSIS — J449 Chronic obstructive pulmonary disease, unspecified: Secondary | ICD-10-CM | POA: Diagnosis not present

## 2018-03-19 DIAGNOSIS — R413 Other amnesia: Secondary | ICD-10-CM

## 2018-03-19 DIAGNOSIS — R634 Abnormal weight loss: Secondary | ICD-10-CM | POA: Diagnosis not present

## 2018-03-19 DIAGNOSIS — F4320 Adjustment disorder, unspecified: Secondary | ICD-10-CM | POA: Insufficient documentation

## 2018-03-19 DIAGNOSIS — R7611 Nonspecific reaction to tuberculin skin test without active tuberculosis: Secondary | ICD-10-CM | POA: Diagnosis not present

## 2018-03-19 DIAGNOSIS — I10 Essential (primary) hypertension: Secondary | ICD-10-CM | POA: Diagnosis present

## 2018-03-19 DIAGNOSIS — F17210 Nicotine dependence, cigarettes, uncomplicated: Secondary | ICD-10-CM | POA: Diagnosis not present

## 2018-03-19 DIAGNOSIS — R918 Other nonspecific abnormal finding of lung field: Secondary | ICD-10-CM | POA: Insufficient documentation

## 2018-03-19 NOTE — Telephone Encounter (Signed)
Went on the [employer] to start prior auth for US abdomen limited   CPT- US Abdomen Limited [phone]  ICD 10- Hx of hepatitis Z86.19    Hx hep c w/ cirrhosis    Service order #- [id number]   Authorization # [id number] Effective- 03/19/2018 Expires- 04/18/2018  Contacted Carolyne Fiscal and provided authorization #

## 2018-03-19 NOTE — Progress Notes (Signed)
Patient ID: Raven Torres, female    DOB: 11/18/1955  MRN: [id number]  CC: Hypertension   Subjective: Raven Torres is a 62 y.o. female who presents for chronic disease management. Her concerns today include:  62 year old female with history of HTN, cured hepatitis C with cirrhosis, adjustment disorder, HL, tobacco dependence, and rheumatoid arthritis/fibromyalgia followed by rheumatology at [facility], latent TB currently on INH, lung nodules   She moved in with her daughter and kids.  She loves it.  Lung Nodules:  Saw Dr. Vaughan Browner several days ago.  Repeat CAT scan of the chest showed that the nodules in the upper lungs have decreased but more have appeared in the lower lungs with appearance that suggests an inflammatory process.  He is ordered sputum cultures for AFB.  He has stopped Spiriva and placed her on  Tudorza instead for COPD -She continues to smoke.  She states that she is working on trying to quit.  Hep C with cirrhosis: She has been cured, however, she is due for liver ultrasound for HCC screening  Loss 12 lbs since 11/2017.  She continues to state that the weight loss is intentional.  She reports she has changed her eating a lot since being told she has a lot of plaque buildup in the coronaries.  Appetite is down. No blood in stools Gets hot at nights.  But no fevers. She denies any feelings of depression.  She feels that she is actually doing better now that she lives with her daughter and grandchildren  Poor memory continues to be a problem for her.  "I do get confused at times."  Denies any safety issues like forgetting to turn the stove off or leave the water running.  She is unable to tell me with her daughter and grandchildren are concerned about her memory; she just does not recall. Feels anxious when she does not remember things like why she went into the kitchen.    Patient Active Problem List   Diagnosis Date Noted  . High cholesterol 06/24/2017  .  Shortness of breath 06/04/2017  . Coronary artery calcification 06/04/2017  . Other fatigue 06/04/2017  . Centrilobular emphysema ([place]) 05/15/2017  . Atherosclerosis 05/15/2017  . Memory change 05/15/2017  . TB lung, latent 04/16/2017  . Lung nodules 04/16/2017  . Diabetes mellitus screening 11/19/2016  . Fibromyalgia 05/15/2015  . Rheumatoid arthritis ([place]) 08/22/2014  . HTN (hypertension) 08/22/2014  . Tobacco abuse 08/22/2014  . Hepatic cirrhosis ([place]) 05/31/2014  . Chronic hepatitis C virus infection ([place]) 05/03/2014     Current Outpatient Medications on File Prior to Visit  Medication Sig Dispense Refill  . Aclidinium Bromide (TUDORZA PRESSAIR) 400 MCG/ACT AEPB Inhale 1 puff into the lungs 2 (two) times daily for 1 day. 1 each 0  . Aclidinium Bromide (TUDORZA PRESSAIR) 400 MCG/ACT AEPB Inhale 1 puff into the lungs daily. 1 each 5  . albuterol (PROAIR HFA) 108 (90 Base) MCG/ACT inhaler INHALE 2 PUFFS INTO THE LUNGS EVERY 6 HOURS AS NEEDED FOR WHEEZING OR SHORTNESS OF BREATH 8.5 g 0  . aspirin EC 81 MG tablet Take 1 tablet (81 mg total) by mouth daily. 30 tablet 0  . atorvastatin (LIPITOR) 40 MG tablet Take 1 tablet (40 mg total) by mouth daily at 6 PM. 90 tablet 3  . CALCIUM CARBONATE-VIT D-MIN PO Take 1,200 mg by mouth.    . Cholecalciferol (VITAMIN D3) 1000 units CAPS Take by mouth.    . cyclobenzaprine (FLEXERIL) 10  MG tablet Take 1 tablet (10 mg total) by mouth 3 (three) times daily as needed for muscle spasms. 30 tablet 3  . diclofenac sodium (VOLTAREN) 1 % GEL Apply 2 g topically 4 (four) times daily. 1 Tube 2  . folic acid (FOLVITE) 1 MG tablet TK 1 T PO  D  3  . gabapentin (NEURONTIN) 300 MG capsule Take 1 capsule (300 mg total) by mouth daily. 90 capsule 3  . hydroxychloroquine (PLAQUENIL) 200 MG tablet Take 200 mg by mouth 2 (two) times daily.    Marland Kitchen lisinopril-hydrochlorothiazide (PRINZIDE,ZESTORETIC) 10-12.5 MG tablet Take 1 tablet by mouth daily. 90 tablet 1  . nystatin  cream (MYCOSTATIN) Apply 1 application topically 2 (two) times daily. 30 g 0  . sulfaSALAzine (AZULFIDINE) 500 MG EC tablet Take by mouth.     No current facility-administered medications on file prior to visit.     Allergies  Allergen Reactions  . Amitriptyline Other (See Comments)    Eye spasms per pt  . Codeine Nausea Only  . Tramadol Nausea Only    Social History   Socioeconomic History  . Marital status: Married    Spouse name: Not on file  . Number of children: Not on file  . Years of education: Not on file  . Highest education level: Not on file  Occupational History  . Not on file  Social Needs  . Financial resource strain: Not on file  . Food insecurity:    Worry: Not on file    Inability: Not on file  . Transportation needs:    Medical: Not on file    Non-medical: Not on file  Tobacco Use  . Smoking status: Current Every Day Smoker    Packs/day: 0.50    Types: Cigarettes  . Smokeless tobacco: Never Used  . Tobacco comment: smoking 10 cigs daily  Substance and Sexual Activity  . Alcohol use: No    Alcohol/week: 0.0 standard drinks  . Drug use: No  . Sexual activity: Never  Lifestyle  . Physical activity:    Days per week: Not on file    Minutes per session: Not on file  . Stress: Not on file  Relationships  . Social connections:    Talks on phone: Not on file    Gets together: Not on file    Attends religious service: Not on file    Active member of club or organization: Not on file    Attends meetings of clubs or organizations: Not on file    Relationship status: Not on file  . Intimate partner violence:    Fear of current or ex partner: Not on file    Emotionally abused: Not on file    Physically abused: Not on file    Forced sexual activity: Not on file  Other Topics Concern  . Not on file  Social History Narrative  . Not on file    Family History  Problem Relation Age of Onset  . Heart disease Mother   . Cancer Mother     Past  Surgical History:  Procedure Laterality Date  . ABDOMINAL HYSTERECTOMY    . KNEE SURGERY Right     ROS: Review of Systems Negative except as stated above PHYSICAL EXAM: BP 130/71   Pulse 80   Temp 97.7 F (36.5 C) (Oral)   Resp 16   Wt 173 lb 3.2 oz (78.6 kg)   SpO2 94%   BMI 28.82 kg/m   Wt Readings from Last  3 Encounters:  03/19/18 173 lb 3.2 oz (78.6 kg)  03/16/18 172 lb 9.6 oz (78.3 kg)  11/27/17 185 lb 6.4 oz (84.1 kg)    Physical Exam General appearance - alert, well appearing, elderly female and in no distress Mental status -she is oriented to person place, date and time.  She recalls 3/3 things on 5-minute recall. Mouth - mucous membranes moist, pharynx normal without lesions Neck - supple, no significant adenopathy Lymphatics -no cervical or axillary lymphadenopathy.   Chest -breath sounds slightly decreased but without wheezes or rhonchi's. Heart - normal rate, regular rhythm, normal S1, S2, no murmurs, rubs, clicks or gallops Abdomen - soft, nontender, nondistended, no masses or organomegaly Extremities - peripheral pulses normal, no pedal edema, no clubbing or cyanosis  ASSESSMENT AND PLAN: 1. Essential hypertension At goal.  Continue lisinopril/HCTZ.  2. Poor memory Neurology referral - TSH  3. Weight loss -Patient does not want extensive work-up on this as she insists that it is intentional weight loss.  However I will check a TSH.  She denies any depression at this time.  She is up-to-date with age-appropriate cancer screenings.  4. Lung nodules Being followed and evaluated by pulmonary  5. History of hepatitis C - US Abdomen Limited; Future  Patient was given the opportunity to ask questions.  Patient verbalized understanding of the plan and was able to repeat key elements of the plan.   Orders Placed This Encounter  Procedures  . US Abdomen Limited  . TSH     Requested Prescriptions    No prescriptions requested or ordered in this  encounter    No follow-ups on file.  Karle Plumber, MD, FACP

## 2018-03-20 LAB — TSH: TSH: 1.59 u[IU]/mL (ref 0.450–4.500)

## 2018-03-24 NOTE — Telephone Encounter (Signed)
PI OVERSIGHT ATTESTATION  I the Principal Investigator (PI) for the above mentioned study attest that I reviewed the above mentioned clinical research coordinator notes on research subject  Raven Torres  born 1956-01-14 . I  agree with the findings mentioned above  Marshell Garfinkel MD [facility] 03/24/2018, 4:33 PM

## 2018-03-25 ENCOUNTER — Telehealth: Payer: Self-pay

## 2018-03-25 ENCOUNTER — Ambulatory Visit (INDEPENDENT_AMBULATORY_CARE_PROVIDER_SITE_OTHER): Payer: Medicaid Other | Admitting: Pulmonary Disease

## 2018-03-25 DIAGNOSIS — J438 Other emphysema: Secondary | ICD-10-CM

## 2018-03-25 LAB — PULMONARY FUNCTION TEST
DL/VA % PRED: 58 %
DL/VA: 2.87 ml/min/mmHg/L
DLCO unc % pred: 46 %
DLCO unc: 12.03 ml/min/mmHg
FEF 25-75 Post: 0.54 L/sec
FEF 25-75 Pre: 0.33 L/sec
FEF2575-%CHANGE-POST: 63 %
FEF2575-%PRED-POST: 24 %
FEF2575-%Pred-Pre: 15 %
FEV1-%Change-Post: 24 %
FEV1-%PRED-POST: 39 %
FEV1-%Pred-Pre: 31 %
FEV1-PRE: 0.8 L
FEV1-Post: 1 L
FEV1FVC-%Change-Post: 5 %
FEV1FVC-%Pred-Pre: 56 %
FEV6-%Change-Post: 18 %
FEV6-%PRED-POST: 66 %
FEV6-%Pred-Pre: 55 %
FEV6-PRE: 1.77 L
FEV6-Post: 2.1 L
FEV6FVC-%CHANGE-POST: 0 %
FEV6FVC-%PRED-PRE: 100 %
FEV6FVC-%Pred-Post: 100 %
FVC-%CHANGE-POST: 18 %
FVC-%PRED-POST: 65 %
FVC-%PRED-PRE: 55 %
FVC-PRE: 1.84 L
FVC-Post: 2.17 L
POST FEV1/FVC RATIO: 46 %
PRE FEV6/FVC RATIO: 96 %
Post FEV6/FVC ratio: 97 %
Pre FEV1/FVC ratio: 44 %
RV % PRED: 234 %
RV: 5 L
TLC % pred: 139 %
TLC: 7.26 L

## 2018-03-25 NOTE — Telephone Encounter (Signed)
Contacted pt to go over lab results pt didn't answer left a detailed vm informing pt of results and if she has any questions or concerns to give me a call   If pt calls back please give results: thyroid level was normal

## 2018-03-25 NOTE — Progress Notes (Signed)
PFT done today. 

## 2018-04-02 ENCOUNTER — Ambulatory Visit (HOSPITAL_COMMUNITY)
Admission: RE | Admit: 2018-04-02 | Discharge: 2018-04-02 | Disposition: A | Discharge status: Home / Self Care | Payer: Medicaid Other | Source: Ambulatory Visit | Attending: Internal Medicine | Admitting: Internal Medicine

## 2018-04-02 DIAGNOSIS — K7460 Unspecified cirrhosis of liver: Secondary | ICD-10-CM | POA: Insufficient documentation

## 2018-04-02 DIAGNOSIS — Z8619 Personal history of other infectious and parasitic diseases: Secondary | ICD-10-CM

## 2018-04-03 ENCOUNTER — Telehealth: Payer: Self-pay

## 2018-04-03 NOTE — Telephone Encounter (Signed)
Contacted pt to go over [place] results pt didn't answer left a detailed vm informing pt of results and if she has any questions or concerns to give me a call  If pt calls back please give results: ultrasound of her liver did not reveal any masses in the liver. She will need to be screened again in about 6 to 12 months.

## 2018-04-23 ENCOUNTER — Telehealth: Payer: Self-pay | Admitting: Pulmonary Disease

## 2018-04-23 ENCOUNTER — Encounter: Payer: Self-pay | Admitting: Pulmonary Disease

## 2018-04-23 ENCOUNTER — Ambulatory Visit (INDEPENDENT_AMBULATORY_CARE_PROVIDER_SITE_OTHER): Payer: Medicaid Other | Admitting: Pulmonary Disease

## 2018-04-23 DIAGNOSIS — R918 Other nonspecific abnormal finding of lung field: Secondary | ICD-10-CM

## 2018-04-23 DIAGNOSIS — Z720 Tobacco use: Secondary | ICD-10-CM

## 2018-04-23 DIAGNOSIS — L299 Pruritus, unspecified: Secondary | ICD-10-CM | POA: Insufficient documentation

## 2018-04-23 DIAGNOSIS — J449 Chronic obstructive pulmonary disease, unspecified: Secondary | ICD-10-CM

## 2018-04-23 DIAGNOSIS — J432 Centrilobular emphysema: Secondary | ICD-10-CM

## 2018-04-23 DIAGNOSIS — Z23 Encounter for immunization: Secondary | ICD-10-CM

## 2018-04-23 DIAGNOSIS — F17210 Nicotine dependence, cigarettes, uncomplicated: Secondary | ICD-10-CM

## 2018-04-23 DIAGNOSIS — L308 Other specified dermatitis: Secondary | ICD-10-CM | POA: Insufficient documentation

## 2018-04-23 MED ORDER — TIOTROPIUM BROMIDE-OLODATEROL 2.5-2.5 MCG/ACT IN AERS: 2.0000 | INHALATION_SPRAY | Freq: Every day | RESPIRATORY_TRACT | 0 refills | Status: DC

## 2018-04-23 NOTE — Assessment & Plan Note (Signed)
Repeat CT without contrast in 4 months  Follow up in 3 months  Stop smoking

## 2018-04-23 NOTE — Assessment & Plan Note (Signed)
Follow-up with primary care regarding smoking cessation >>>Can discuss with him Wellbutrin versus Chantix versus reduce to quit method  We recommend that you stop smoking.   Smoking Cessation Resources:  1 800 QUIT NOW  >>> Patient to call this resource and utilize it to help support her quit smoking >>> Keep up your hard work with stopping smoking  You can also contact the [facility] >>>For smoking cessation classes call [phone]  We do not recommend using e-cigarettes as a form of stopping smoking

## 2018-04-23 NOTE — Telephone Encounter (Signed)
Spoke with patient, states she is itching and has a raised red rash on her buttocks and stomach since starting turdorza. Appointment made for today at 2:30pm.

## 2018-04-23 NOTE — Assessment & Plan Note (Signed)
STOP Caprice Renshaw now   [employer] Respimat inhaler >>>2 puffs daily >>>Take this no matter what >>>This is not a rescue inhaler  Reg flu vaccine today  Sputum cultures >>> Produce sputum in the morning, bring to the lab within 4 hours of producing sputum  Repeat CT chest without contrast in 4 months  Follow-up with primary care regarding smoking cessation >>>Can discuss with him Wellbutrin versus Chantix versus reduce to quit method     Note your daily symptoms > remember "red flags" for COPD:   >>>Increase in cough >>>increase in sputum production >>>increase in shortness of breath or activity  intolerance.   If you notice these symptoms, please call the office to be seen.      We recommend that you stop smoking.   Smoking Cessation Resources:  1 800 QUIT NOW  >>> Patient to call this resource and utilize it to help support her quit smoking >>> Keep up your hard work with stopping smoking  You can also contact the [facility] >>>For smoking cessation classes call [phone]  We do not recommend using e-cigarettes as a form of stopping smoking

## 2018-04-23 NOTE — Progress Notes (Signed)
@Patient  ID: Raven Torres, female    DOB: 06/18/1956, 62 y.o.   MRN: [id number]  Chief Complaint  Patient presents with  . Acute Visit    Suspected drug reaction    Referring provider: Ladell Pier, MD  HPI:  62 year old female former smoker followed in our office for dyspnea, probable COPD, emphysema, subcentimeter nodules in right upper and right middle lobe (following on CT scan)  PMH: History of cirrhosis secondary to hepatitis C, hypertension, depression, arthritis, fibromyalgia, latent TB (positive QuantiFERON gold, finished INH therapy for latent TB in 2018) Smoker/ Smoking History: Current Smoker. 40 pack year smoking history.  Smoking half a pack a day. Maintenance:  Tudorza Pt of: Dr. Vaughan Browner  Recent [facility] Encounters:   03/16/2018-office visit-Mannam 62 year old patient presenting today for follow-up of CT scan.  Patient is continued to have dyspnea with exertion, productive cough with white mucus, occasional wheezing.  Patient continues to smoke half a pack a day.  Patient has been evaluated by cardiology for coronary arthrosclerosis, cardio stress test was low risk. Plan: Stop Spiriva start Tudorza, PFTs, CT chest without contrast in 5 months, continue working on smoking cessation, check sputum for cultures including AFB  04/23/2018  - Visit   62 year old female current every day smoker (6 cigarettes a day) who is currently working to stop smoking presenting today with concerns over recent [place] inhaler start.  Patient believes that the Caprice Renshaw is linked to her increased pleuritic rash along abdomen as well as back side and lower back.  Patient is willing and working towards stopping smoking.  Patient is contemplating starting Chantix but has concerns about her anxiety and how this may affect her mental health.  Patient plans on following up with primary care to discuss starting Chantix.  MMRC - Breathlessness Score 2 - on level ground, I walk slower  than people of the same age because of breathlessness, or have to stop for breathe when walking to my own pace   Of note, patient never completed sputum samples that was ordered at last office visit.  Patient says that she "forgot that she was supposed to do that that is my mistake".   Tests:   CT scan 04/18/17-multiple subcentimeter pulmonary nodule. The largest one is a left lower lobe, 1 cm in size. >>>Diffuse emphysematous changes. Aortic, coronary atherosclerosis. CT 11/18/2017- dominant left lower lobe nodule has resolved.  Persistence of multiple subcentimeter pulmonary nodules with some new nodules CT 02/18/2018- subcentimeter pulmonary nodules in the right upper and middle lobe have improved while there was an increase in size and number in the lower lobes.  Moderate to severe emphysema.  03/25/2018-pulmonary function test- FVC 1.84 (55% predicted), postbronchodilator ratio 46, FEV1 39, profound bronchodilator response, DLCO 46  03/16/2017-respiratory sputum >>>  Labs: Alpha-1 antitrypsin 05/20/2017-162  Cardiac stress test 08/11/2016 EF 72%, low risk study.  Chart Review:     Specialty Problems      Pulmonary Problems   Lung nodules    CT scan 04/18/17-multiple subcentimeter pulmonary nodule. The largest one is a left lower lobe, 1 cm in size. >>>Diffuse emphysematous changes. Aortic, coronary atherosclerosis. CT 11/18/2017- dominant left lower lobe nodule has resolved.  Persistence of multiple subcentimeter pulmonary nodules with some new nodules CT 02/18/2018- subcentimeter pulmonary nodules in the right upper and middle lobe have improved while there was an increase in size and number in the lower lobes.  Moderate to severe emphysema.      TB lung, latent  Centrilobular emphysema (HCC)   Shortness of breath   GOLD COPD III B    CT 02/18/2018- subcentimeter pulmonary nodules in the right upper and middle lobe have improved while there was an increase in size and number in  the lower lobes.  Moderate to severe emphysema.  03/25/2018-pulmonary function test- FVC 1.84 (55% predicted), postbronchodilator ratio 46, FEV1 39, profound bronchodilator response, DLCO 46          Allergies  Allergen Reactions  . Amitriptyline Other (See Comments)    Eye spasms per pt  . Codeine Nausea Only  . Tramadol Nausea Only    Immunization History  Administered Date(s) Administered  . Hepatitis A, Adult 05/03/2014, 10/27/2014  . Influenza,inj,Quad PF,6+ Mos 05/03/2014, 05/15/2015, 05/15/2017, 04/23/2018  . Pneumococcal Polysaccharide-23 11/19/2016  . Tdap 11/19/2016   >>> Flu vaccine today  Past Medical History:  Diagnosis Date  . Arthritis   . Colitis   . Hepatitis C   . TB (tuberculosis)     Tobacco History: Social History   Tobacco Use  Smoking Status Current Every Day Smoker  . Packs/day: 0.50  . Types: Cigarettes  Smokeless Tobacco Never Used  Tobacco Comment   smoking 10 cigs daily, down to 6 cigs a day 04/23/18   Ready to quit: Not Answered Counseling given: Yes Comment: smoking 10 cigs daily, down to 6 cigs a day 04/23/18  Discussed extensively with patient she needs to stop smoking.  Patient reports that she is down to 6 cigarettes a day.  Patient is actively trying to reduce smoking.  Unfortunately patient lives with daughter who also smokes.  Encourage patient to work with daughter for them both to stop smoking.  Smoking assessment and cessation counseling  Patient currently smoking: 6 cigarettes a day I have advised the patient to quit/stop smoking as soon as possible due to high risk for multiple medical problems.  It will also be very difficult for [place] to manage patient's  respiratory symptoms and status if we continue to expose her lungs to a known irritant.  We do not advise e-cigarettes as a form of stopping smoking.  Patient is considering starting Chantix.  Patient will follow-up with primary care to discuss Chantix as she also has known  chronic anxiety.  Also discussed with patient she could consider Wellbutrin.  Patient will follow-up with primary care and discuss.  Patient is willing to quit smoking.  I have advised the patient that we can assist and have options of nicotine replacement therapy, provided smoking cessation education today, provided smoking cessation counseling, and provided cessation resources.  Follow-up next office visit office visit for assessment of smoking cessation.    Smoking cessation counseling advised for: 10min    Outpatient Encounter Medications as of 04/23/2018  Medication Sig  . Aclidinium Bromide (TUDORZA PRESSAIR) 400 MCG/ACT AEPB Inhale 1 puff into the lungs daily.  Marland Kitchen albuterol (PROAIR HFA) 108 (90 Base) MCG/ACT inhaler INHALE 2 PUFFS INTO THE LUNGS EVERY 6 HOURS AS NEEDED FOR WHEEZING OR SHORTNESS OF BREATH  . aspirin EC 81 MG tablet Take 1 tablet (81 mg total) by mouth daily.  Marland Kitchen atorvastatin (LIPITOR) 40 MG tablet Take 1 tablet (40 mg total) by mouth daily at 6 PM.  . CALCIUM CARBONATE-VIT D-MIN PO Take 1,200 mg by mouth.  . Cholecalciferol (VITAMIN D3) 1000 units CAPS Take by mouth.  . cyclobenzaprine (FLEXERIL) 10 MG tablet Take 1 tablet (10 mg total) by mouth 3 (three) times daily as needed for muscle spasms.  Marland Kitchen  diclofenac sodium (VOLTAREN) 1 % GEL Apply 2 g topically 4 (four) times daily.  . folic acid (FOLVITE) 1 MG tablet TK 1 T PO  D  . gabapentin (NEURONTIN) 300 MG capsule Take 1 capsule (300 mg total) by mouth daily.  . hydroxychloroquine (PLAQUENIL) 200 MG tablet Take 200 mg by mouth 2 (two) times daily.  Marland Kitchen lisinopril-hydrochlorothiazide (PRINZIDE,ZESTORETIC) 10-12.5 MG tablet Take 1 tablet by mouth daily.  Marland Kitchen nystatin cream (MYCOSTATIN) Apply 1 application topically 2 (two) times daily.  Marland Kitchen sulfaSALAzine (AZULFIDINE) 500 MG EC tablet Take by mouth.  . Aclidinium Bromide (TUDORZA PRESSAIR) 400 MCG/ACT AEPB Inhale 1 puff into the lungs 2 (two) times daily for 1 day.  .  Tiotropium Bromide-Olodaterol (STIOLTO RESPIMAT) 2.5-2.5 MCG/ACT AERS Inhale 2 puffs into the lungs daily.   No facility-administered encounter medications on file as of 04/23/2018.      Review of Systems  Review of Systems  Constitutional: Positive for fatigue. Negative for chills, fever and unexpected weight change.  HENT: Negative for congestion, ear pain, postnasal drip, sinus pressure and sinus pain.   Respiratory: Positive for shortness of breath. Negative for cough, chest tightness and wheezing.   Cardiovascular: Negative for chest pain and palpitations.  Gastrointestinal: Negative for blood in stool, diarrhea, nausea and vomiting.  Genitourinary: Negative for dysuria, frequency and urgency.  Musculoskeletal: Negative for arthralgias.  Skin: Positive for rash. Negative for color change.       Itching - behind, back   Allergic/Immunologic: Negative for environmental allergies and food allergies.  Neurological: Negative for dizziness, light-headedness and headaches.  Psychiatric/Behavioral: Negative for dysphoric mood. The patient is not nervous/anxious.   All other systems reviewed and are negative.    Physical Exam  BP 124/68 (BP Location: Left Arm, Cuff Size: Normal)   Pulse 82   Ht 5' 4.5" (1.638 m)   Wt 174 lb 9.6 oz (79.2 kg)   SpO2 94%   BMI 29.51 kg/m   Wt Readings from Last 5 Encounters:  04/23/18 174 lb 9.6 oz (79.2 kg)  03/19/18 173 lb 3.2 oz (78.6 kg)  03/16/18 172 lb 9.6 oz (78.3 kg)  11/27/17 185 lb 6.4 oz (84.1 kg)  08/25/17 191 lb (86.6 kg)     Physical Exam  Constitutional: She is oriented to person, place, and time and well-developed, well-nourished, and in no distress. No distress.  HENT:  Head: Normocephalic and atraumatic.  Right Ear: Hearing, tympanic membrane, external ear and ear canal normal.  Left Ear: Hearing, tympanic membrane, external ear and ear canal normal.  Nose: Nose normal. Right sinus exhibits no maxillary sinus tenderness and  no frontal sinus tenderness. Left sinus exhibits no maxillary sinus tenderness and no frontal sinus tenderness.  Mouth/Throat: Uvula is midline and oropharynx is clear and moist. No oropharyngeal exudate.  Eyes: Pupils are equal, round, and reactive to light.  Neck: Normal range of motion. Neck supple. No JVD present.  Cardiovascular: Normal rate, regular rhythm and normal heart sounds.  Pulmonary/Chest: Effort normal and breath sounds normal. No accessory muscle usage. No respiratory distress. She has no decreased breath sounds. She has no wheezes. She has no rhonchi.  Abdominal: Soft. Bowel sounds are normal. There is no tenderness.  Musculoskeletal: Normal range of motion. She exhibits no edema.  Lymphadenopathy:    She has no cervical adenopathy.  Neurological: She is alert and oriented to person, place, and time. Gait normal.  Skin: Skin is warm and dry. Abrasion and rash (slight excoriation along abdomen where  patient has been scratching) noted. She is not diaphoretic. No erythema.     Psychiatric: Mood, memory, affect and judgment normal.  Nursing note and vitals reviewed.     Lab Results:  CBC    Component Value Date/Time   WBC 8.1 11/19/2016 1223   WBC 8.0 02/26/2016 1132   RBC 5.76 (H) 11/19/2016 1223   RBC 5.19 (H) 02/26/2016 1132   HGB 17.5 (H) 11/19/2016 1223   HCT 51.1 (H) 11/19/2016 1223   PLT 174 11/19/2016 1223   MCV 89 11/19/2016 1223   MCH 30.4 11/19/2016 1223   MCH 30.4 02/26/2016 1132   MCHC 34.2 11/19/2016 1223   MCHC 33.8 02/26/2016 1132   RDW 14.4 11/19/2016 1223   LYMPHSABS 2.9 11/19/2016 1223   MONOABS 720 02/26/2016 1132   EOSABS 0.2 11/19/2016 1223   BASOSABS 0.0 11/19/2016 1223    BMET    Component Value Date/Time   NA 138 11/19/2016 1223   K 4.9 11/19/2016 1223   CL 98 11/19/2016 1223   CO2 26 11/19/2016 1223   GLUCOSE 103 (H) 11/19/2016 1223   GLUCOSE 103 (H) 02/26/2016 1143   BUN 7 (L) 11/19/2016 1223   CREATININE 0.81 11/19/2016  1223   CREATININE 0.70 02/26/2016 1143   CALCIUM 10.1 11/19/2016 1223   GFRNONAA 78 11/19/2016 1223   GFRNONAA >89 08/23/2014 1506   GFRAA 89 11/19/2016 1223   GFRAA >89 08/23/2014 1506    BNP No results found for: BNP  ProBNP No results found for: PROBNP  Imaging: US Abdomen Limited  Result Date: 04/02/2018 CLINICAL DATA:  Hepatitis-C. EXAM: ULTRASOUND ABDOMEN LIMITED RIGHT UPPER QUADRANT COMPARISON:  Ultrasound of January 06, 2017. FINDINGS: Gallbladder: No gallstones or wall thickening visualized. No sonographic Murphy sign noted by sonographer. Common bile duct: Diameter: 4 mm which is within normal limits. Liver: No focal abnormality is noted. Heterogeneous echotexture is noted with nodular hepatic contours consistent with hepatic cirrhosis. Portal vein is patent on color Doppler imaging with normal direction of blood flow towards the liver. IMPRESSION: Findings consistent with hepatic cirrhosis. No definite sonographic focal abnormality is noted. Electronically Signed   By: Marijo Conception, M.D.   On: 04/02/2018 15:40      Assessment & Plan:   Pleasant 62 year old patient seen office visit today.  Emphasized the importance of patient stop smoking.  I do not believe that the current reaction she is having is from her Caprice Renshaw but will stop it today.  We will also start Stiolto Respimat as a LAMA/LABA may help manage her newly diagnosed GOLD COPD IIIB via recent pulmonary function test.  I emphasized the importance of patient stopping smoking.  Follow-up in 3 months.  Patient to have repeat CT without contrast in 4 months.  Provided sputum sample cups for patient today.  Patient to produce sputum sample and return to the lab.  Centrilobular emphysema ([place]) STOP Tudorza now   Stiolto Respimat inhaler >>>2 puffs daily >>>Take this no matter what >>>This is not a rescue inhaler  Reg flu vaccine today  Sputum cultures >>> Produce sputum in the morning, bring to the lab within 4  hours of producing sputum  Repeat CT chest without contrast in 4 months  Follow-up with primary care regarding smoking cessation >>>Can discuss with him Wellbutrin versus Chantix versus reduce to quit method     Note your daily symptoms > remember "red flags" for COPD:   >>>Increase in cough >>>increase in sputum production >>>increase in shortness of breath  or activity  intolerance.   If you notice these symptoms, please call the office to be seen.      We recommend that you stop smoking.   Smoking Cessation Resources:  1 800 QUIT NOW  >>> Patient to call this resource and utilize it to help support her quit smoking >>> Keep up your hard work with stopping smoking  You can also contact the [facility] >>>For smoking cessation classes call [phone]  We do not recommend using e-cigarettes as a form of stopping smoking   GOLD COPD III B Stiolto Respimat inhaler >>>2 puffs daily >>>Take this no matter what >>>This is not a rescue inhaler  Reg flu vaccine today  Repeat CT chest without contrast in 4 months  Follow-up with primary care regarding smoking cessation >>>Can discuss with him Wellbutrin versus Chantix versus reduce to quit method     Note your daily symptoms > remember "red flags" for COPD:   >>>Increase in cough >>>increase in sputum production >>>increase in shortness of breath or activity  intolerance.   If you notice these symptoms, please call the office to be seen.      We recommend that you stop smoking.   Smoking Cessation Resources:  1 800 QUIT NOW  >>> Patient to call this resource and utilize it to help support her quit smoking >>> Keep up your hard work with stopping smoking  You can also contact the [facility] >>>For smoking cessation classes call [phone]  We do not recommend using e-cigarettes as a form of stopping smoking   Tobacco abuse Follow-up with primary care regarding smoking  cessation >>>Can discuss with him Wellbutrin versus Chantix versus reduce to quit method  We recommend that you stop smoking.   Smoking Cessation Resources:  1 800 QUIT NOW  >>> Patient to call this resource and utilize it to help support her quit smoking >>> Keep up your hard work with stopping smoking  You can also contact the [facility] >>>For smoking cessation classes call [phone]  We do not recommend using e-cigarettes as a form of stopping smoking   Lung nodules Repeat CT without contrast in 4 months  Follow up in 3 months  Stop smoking     Lauraine Rinne, NP 04/23/2018

## 2018-04-23 NOTE — Assessment & Plan Note (Signed)
Will stop Tudorza at this time We will start Stiolto Respimat in its place Discussed with patient that she can use cold compresses on site as well as can take as needed Benadryl to help with itching  Patient to follow-up with primary care if symptoms persist in spite of stopping [place]

## 2018-04-23 NOTE — Assessment & Plan Note (Addendum)
Stiolto Respimat inhaler >>>2 puffs daily >>>Take this no matter what >>>This is not a rescue inhaler  Reg flu vaccine today  Repeat CT chest without contrast in 4 months  Follow-up with primary care regarding smoking cessation >>>Can discuss with him Wellbutrin versus Chantix versus reduce to quit method     Note your daily symptoms > remember "red flags" for COPD:   >>>Increase in cough >>>increase in sputum production >>>increase in shortness of breath or activity  intolerance.   If you notice these symptoms, please call the office to be seen.      We recommend that you stop smoking.   Smoking Cessation Resources:  1 800 QUIT NOW  >>> Patient to call this resource and utilize it to help support her quit smoking >>> Keep up your hard work with stopping smoking  You can also contact the [facility] >>>For smoking cessation classes call [phone]  We do not recommend using e-cigarettes as a form of stopping smoking

## 2018-04-23 NOTE — Patient Instructions (Addendum)
Stiolto Respimat inhaler >>>2 puffs daily >>>Take this no matter what >>>This is not a rescue inhaler  Reg flu vaccine today  Sputum cultures >>> Produce sputum in the morning, bring to the lab within 4 hours of producing sputum  Repeat CT chest without contrast in 4 months  Follow-up with primary care regarding smoking cessation >>>Can discuss with him Wellbutrin versus Chantix versus reduce to quit method     Note your daily symptoms > remember "red flags" for COPD:   >>>Increase in cough >>>increase in sputum production >>>increase in shortness of breath or activity  intolerance.   If you notice these symptoms, please call the office to be seen.      We recommend that you stop smoking.   Smoking Cessation Resources:  1 800 QUIT NOW  >>> Patient to call this resource and utilize it to help support her quit smoking >>> Keep up your hard work with stopping smoking  You can also contact the [facility] >>>For smoking cessation classes call [phone]  We do not recommend using e-cigarettes as a form of stopping smoking    It is flu season:   >>>Remember to be washing your hands regularly, using hand sanitizer, be careful to use around herself with has contact with people who are sick will increase her chances of getting sick yourself. >>> Best ways to protect herself from the flu: Receive the yearly flu vaccine, practice good hand hygiene washing with soap and also using hand sanitizer when available, eat a nutritious meals, get adequate rest, hydrate appropriately    As of 06/08/2018 we will be moving! We will no longer be at our [place] location.   Our new address and phone number will be:  [address] [place], [place] [address] Telephone number: [phone]   Please contact the office if your symptoms worsen or you have concerns that you are not improving.   Thank you for choosing [facility] for your healthcare, and for  allowing [place] to partner with you on your healthcare journey. [name] thankful to be able to provide care to you today.   Wyn Quaker FNP-C   Influenza Virus Vaccine injection What is this medicine? INFLUENZA VIRUS VACCINE (in floo EN zuh VAHY ruhs vak SEEN) helps to reduce the risk of getting influenza also known as the flu. The vaccine only helps protect you against some strains of the flu. This medicine may be used for other purposes; ask your health care provider or pharmacist if you have questions. COMMON BRAND NAME(S): Afluria, Agriflu, Alfuria, FLUAD, Fluarix, Fluarix Quadrivalent, Flublok, Flublok Quadrivalent, FLUCELVAX, Flulaval, Fluvirin, Fluzone, Fluzone High-Dose, Fluzone Intradermal What should I tell my health care provider before I take this medicine? They need to know if you have any of these conditions: -bleeding disorder like hemophilia -fever or infection -Guillain-Barre syndrome or other neurological problems -immune system problems -infection with the human immunodeficiency virus (HIV) or AIDS -low blood platelet counts -multiple sclerosis -an unusual or allergic reaction to influenza virus vaccine, latex, other medicines, foods, dyes, or preservatives. Different brands of vaccines contain different allergens. Some may contain latex or eggs. Talk to your doctor about your allergies to make sure that you get the right vaccine. -pregnant or trying to get pregnant -breast-feeding How should I use this medicine? This vaccine is for injection into a muscle or under the skin. It is given by a health care professional. A copy of Vaccine Information Statements will be given  before each vaccination. Read this sheet carefully each time. The sheet may change frequently. Talk to your healthcare provider to see which vaccines are right for you. Some vaccines should not be used in all age groups. Overdosage: If you think you have taken too much of this medicine contact a poison control  center or emergency room at once. NOTE: This medicine is only for you. Do not share this medicine with others. What if I miss a dose? This does not apply. What may interact with this medicine? -chemotherapy or radiation therapy -medicines that lower your immune system like etanercept, anakinra, infliximab, and adalimumab -medicines that treat or prevent blood clots like warfarin -phenytoin -steroid medicines like prednisone or cortisone -theophylline -vaccines This list may not describe all possible interactions. Give your health care provider a list of all the medicines, herbs, non-prescription drugs, or dietary supplements you use. Also tell them if you smoke, drink alcohol, or use illegal drugs. Some items may interact with your medicine. What should I watch for while using this medicine? Report any side effects that do not go away within 3 days to your doctor or health care professional. Call your health care provider if any unusual symptoms occur within 6 weeks of receiving this vaccine. You may still catch the flu, but the illness is not usually as bad. You cannot get the flu from the vaccine. The vaccine will not protect against colds or other illnesses that may cause fever. The vaccine is needed every year. What side effects may I notice from receiving this medicine? Side effects that you should report to your doctor or health care professional as soon as possible: -allergic reactions like skin rash, itching or hives, swelling of the face, lips, or tongue Side effects that usually do not require medical attention (report to your doctor or health care professional if they continue or are bothersome): -fever -headache -muscle aches and pains -pain, tenderness, redness, or swelling at the injection site -tiredness This list may not describe all possible side effects. Call your doctor for medical advice about side effects. You may report side effects to FDA at 1-800-FDA-1088. Where  should I keep my medicine? The vaccine will be given by a health care professional in a clinic, pharmacy, doctor's office, or other health care setting. You will not be given vaccine doses to store at home. NOTE: This sheet is a summary. It may not cover all possible information. If you have questions about this medicine, talk to your doctor, pharmacist, or health care provider.  2018 Elsevier/Gold Standard (2015-02-10 10:07:28)    Coping with Quitting Smoking Quitting smoking is a physical and mental challenge. You will face cravings, withdrawal symptoms, and temptation. Before quitting, work with your health care provider to make a plan that can help you cope. Preparation can help you quit and keep you from giving in. How can I cope with cravings? Cravings usually last for 5-10 minutes. If you get through it, the craving will pass. Consider taking the following actions to help you cope with cravings:  Keep your mouth busy: ? Chew sugar-free gum. ? Suck on hard candies or a straw. ? Brush your teeth.  Keep your hands and body busy: ? Immediately change to a different activity when you feel a craving. ? Squeeze or play with a ball. ? Do an activity or a hobby, like making bead jewelry, practicing needlepoint, or working with wood. ? Mix up your normal routine. ? Take a short exercise break. Go for  a quick walk or run up and down stairs. ? Spend time in public places where smoking is not allowed.  Focus on doing something kind or helpful for someone else.  Call a friend or family member to talk during a craving.  Join a support group.  Call a quit line, such as 1-800-QUIT-NOW.  Talk with your health care provider about medicines that might help you cope with cravings and make quitting easier for you.  How can I deal with withdrawal symptoms? Your body may experience negative effects as it tries to get used to not having nicotine in the system. These effects are called withdrawal  symptoms. They may include:  Feeling hungrier than normal.  Trouble concentrating.  Irritability.  Trouble sleeping.  Feeling depressed.  Restlessness and agitation.  Craving a cigarette.  To manage withdrawal symptoms:  Avoid places, people, and activities that trigger your cravings.  Remember why you want to quit.  Get plenty of sleep.  Avoid coffee and other caffeinated drinks. These may worsen some of your symptoms.  How can I handle social situations? Social situations can be difficult when you are quitting smoking, especially in the first few weeks. To manage this, you can:  Avoid parties, bars, and other social situations where people might be smoking.  Avoid alcohol.  Leave right away if you have the urge to smoke.  Explain to your family and friends that you are quitting smoking. Ask for understanding and support.  Plan activities with friends or family where smoking is not an option.  What are some ways I can cope with stress? Wanting to smoke may cause stress, and stress can make you want to smoke. Find ways to manage your stress. Relaxation techniques can help. For example:  Breathe slowly and deeply, in through your nose and out through your mouth.  Listen to soothing, relaxing music.  Talk with a family member or friend about your stress.  Light a candle.  Soak in a bath or take a shower.  Think about a peaceful place.  What are some ways I can prevent weight gain? Be aware that many people gain weight after they quit smoking. However, not everyone does. To keep from gaining weight, have a plan in place before you quit and stick to the plan after you quit. Your plan should include:  Having healthy snacks. When you have a craving, it may help to: ? Eat plain popcorn, crunchy carrots, celery, or other cut vegetables. ? Chew sugar-free gum.  Changing how you eat: ? Eat small portion sizes at meals. ? Eat 4-6 small meals throughout the day  instead of 1-2 large meals a day. ? Be mindful when you eat. Do not watch television or do other things that might distract you as you eat.  Exercising regularly: ? Make time to exercise each day. If you do not have time for a long workout, do short bouts of exercise for 5-10 minutes several times a day. ? Do some form of strengthening exercise, like weight lifting, and some form of aerobic exercise, like running or swimming.  Drinking plenty of water or other low-calorie or no-calorie drinks. Drink 6-8 glasses of water daily, or as much as instructed by your health care provider.  Summary  Quitting smoking is a physical and mental challenge. You will face cravings, withdrawal symptoms, and temptation to smoke again. Preparation can help you as you go through these challenges.  You can cope with cravings by keeping your mouth busy (  such as by chewing gum), keeping your body and hands busy, and making calls to family, friends, or a helpline for people who want to quit smoking.  You can cope with withdrawal symptoms by avoiding places where people smoke, avoiding drinks with caffeine, and getting plenty of rest.  Ask your health care provider about the different ways to prevent weight gain, avoid stress, and handle social situations. This information is not intended to replace advice given to you by your health care provider. Make sure you discuss any questions you have with your health care provider. Document Released: 07/19/2016 Document Revised: 07/19/2016 Document Reviewed: 07/19/2016 Elsevier Interactive Patient Education  [employer].

## 2018-04-28 ENCOUNTER — Other Ambulatory Visit: Payer: Medicaid Other

## 2018-04-28 DIAGNOSIS — R05 Cough: Secondary | ICD-10-CM

## 2018-04-28 DIAGNOSIS — R059 Cough, unspecified: Secondary | ICD-10-CM

## 2018-05-16 ENCOUNTER — Other Ambulatory Visit: Payer: Self-pay | Admitting: Internal Medicine

## 2018-06-15 LAB — MYCOBACTERIA,CULT W/FLUOROCHROME SMEAR
MICRO NUMBER:: 91146019
SMEAR: NONE SEEN
SPECIMEN QUALITY:: ADEQUATE

## 2018-06-15 LAB — FUNGUS CULTURE W SMEAR
MICRO NUMBER: 91146018
SMEAR:: NONE SEEN
SPECIMEN QUALITY: ADEQUATE

## 2018-06-15 LAB — RESPIRATORY CULTURE OR RESPIRATORY AND SPUTUM CULTURE
MICRO NUMBER:: 91146020
RESULT: NORMAL
SPECIMEN QUALITY:: ADEQUATE

## 2018-07-22 NOTE — Progress Notes (Signed)
@Patient  ID: Raven Torres, female    DOB: [birth year], 62 y.o.   MRN: [id number]  Chief Complaint  Patient presents with  . Follow-up    3 months follow up, wants to be back on stiolto     Referring provider: Ladell Pier, MD  HPI:  62 year old female former smoker followed in our office for dyspnea, probable COPD, emphysema, subcentimeter nodules in right upper and right middle lobe (following on CT scan)  PMH: History of cirrhosis secondary to hepatitis C, hypertension, depression, arthritis, fibromyalgia, latent TB (positive QuantiFERON gold, finished INH therapy for latent TB in 2018) Smoker/ Smoking History: Current Smoker. 49 pack year smoking history.  Smoking 5 cigarettes a day  Maintenance:  Ran out of stiolto, back on tudorza  Pt of: Dr. Vaughan Browner  07/23/2018  - Visit   62 year old female current every day smoker (trying to quit down to 5 to 6 cigarettes a day) with a 49-pack-year smoking history.  Presents today for 20-month follow-up.  Patient completed Stiolto Respimat sample and reported that this was much better for her breathing but then when she ran out of the sample she did not contact our office for a refill.  Patient then prompted to restart using [place] which she has had known reactions to.  Patient continues to have itching and reactions to this today.  Patient reports she has not been adherent as adherent to her Caprice Renshaw because of the fact that she has these reactions that she is only taking controller therapies about once every other 3 days.  Patient continues to smoke but is interested in stopping smoking and plans to discuss this with her primary care doctor in January when she follows up with her about starting on Wellbutrin as well as nicotine replacement therapies.  Patient has already been scheduled for a follow-up CT scan to follow her pulmonary nodule.  Patient reports her breathing has been moderately stable since last office visit she did have a chest  cold last month but symptoms are resolving now patient's daughter does report occasional wheezing with exertion.  MMRC - Breathlessness Score 3 - I stop for breath after walking about 100 yards or after a few minutes on level ground (isle at grocery store is 156ft)  Patient is up-to-date on flu vaccines   Tests:   CT scan 04/18/17-multiple subcentimeter pulmonary nodule. The largest one is a left lower lobe, 1 cm in size. >>>Diffuse emphysematous changes. Aortic, coronary atherosclerosis.  CT 11/18/2017- dominant left lower lobe nodule has resolved.  Persistence of multiple subcentimeter pulmonary nodules with some new nodules   CT 02/18/2018- subcentimeter pulmonary nodules in the right upper and middle lobe have improved while there was an increase in size and number in the lower lobes.  Moderate to severe emphysema.  03/25/2018-pulmonary function test- FVC 1.84 (55% predicted), postbronchodilator ratio 46, FEV1 39, profound bronchodilator response, DLCO 46   Labs: Alpha-1 antitrypsin 05/20/2017-162  Cardiac stress test 08/11/2016 >>>EF 72%, low risk study.   FENO:  No results found for: NITRICOXIDE  PFT: PFT Results Latest Ref Rng & Units 03/25/2018  FVC-Pre L 1.84  FVC-Predicted Pre % 55  FVC-Post L 2.17  FVC-Predicted Post % 65  Pre FEV1/FVC % % 44  Post FEV1/FCV % % 46  FEV1-Pre L 0.80  FEV1-Predicted Pre % 31  FEV1-Post L 1.00  DLCO UNC% % 46  DLCO COR %Predicted % 58  TLC L 7.26  TLC % Predicted % 139  RV % Predicted %  234    Imaging: No results found.    Specialty Problems      Pulmonary Problems   Lung nodules    CT scan 04/18/17-multiple subcentimeter pulmonary nodule. The largest one is a left lower lobe, 1 cm in size. >>>Diffuse emphysematous changes. Aortic, coronary atherosclerosis. CT 11/18/2017- dominant left lower lobe nodule has resolved.  Persistence of multiple subcentimeter pulmonary nodules with some new nodules CT 02/18/2018- subcentimeter  pulmonary nodules in the right upper and middle lobe have improved while there was an increase in size and number in the lower lobes.  Moderate to severe emphysema.      TB lung, latent   Centrilobular emphysema ([place])   Shortness of breath   GOLD COPD III B    CT 02/18/2018- subcentimeter pulmonary nodules in the right upper and middle lobe have improved while there was an increase in size and number in the lower lobes.  Moderate to severe emphysema.  03/25/2018-pulmonary function test- FVC 1.84 (55% predicted), postbronchodilator ratio 46, FEV1 39, profound bronchodilator response, DLCO 46          Allergies  Allergen Reactions  . Tudorza Pressair [Aclidinium Bromide] Itching  . Amitriptyline Other (See Comments)    Eye spasms per pt  . Codeine Nausea Only  . Tramadol Nausea Only   >>> Patient with known allergy with itching to Caprice Renshaw, patient to stop [place]  Immunization History  Administered Date(s) Administered  . Hepatitis A, Adult 05/03/2014, 10/27/2014  . Influenza,inj,Quad PF,6+ Mos 05/03/2014, 05/15/2015, 05/15/2017, 04/23/2018  . Pneumococcal Polysaccharide-23 11/19/2016  . Tdap 11/19/2016   U2D  Past Medical History:  Diagnosis Date  . Arthritis   . Colitis   . Hepatitis C   . TB (tuberculosis)     Tobacco History: Social History   Tobacco Use  Smoking Status Current Every Day Smoker  . Packs/day: 1.00  . Years: 49.00  . Pack years: 49.00  . Types: Cigarettes  Smokeless Tobacco Never Used  Tobacco Comment   smoking 10 cigs daily, down to 6 cigs a day 04/23/18   Ready to quit: No Counseling given: Yes Comment: smoking 10 cigs daily, down to 6 cigs a day 04/23/18  Smoking assessment and cessation counseling  Patient currently smoking: 5 cigarettes daily  I have advised the patient to quit/stop smoking as soon as possible due to high risk for multiple medical problems.  It will also be very difficult for [place] to manage patient's  respiratory symptoms  and status if we continue to expose her lungs to a known irritant.  We do not advise e-cigarettes as a form of stopping smoking.  Patient is willing to quit smoking. Pt has not set quit date. Dtr also smokes. Both want to smoke.   I have advised the patient that we can assist and have options of nicotine replacement therapy, provided smoking cessation education today, provided smoking cessation counseling, and provided cessation resources. Pt is interested in wellbutrin and nrt.   Follow-up next office visit office visit for assessment of smoking cessation.  Smoking cessation counseling advised for: 11 min  Referred to lung cancer screening program    Outpatient Encounter Medications as of 07/23/2018  Medication Sig  . Aclidinium Bromide (TUDORZA PRESSAIR) 400 MCG/ACT AEPB Inhale 1 puff into the lungs 2 (two) times daily for 1 day.  . Aclidinium Bromide (TUDORZA PRESSAIR) 400 MCG/ACT AEPB Inhale 1 puff into the lungs daily.  Marland Kitchen albuterol (PROAIR HFA) 108 (90 Base) MCG/ACT inhaler INHALE 2 PUFFS  INTO THE LUNGS EVERY 6 HOURS AS NEEDED FOR WHEEZING OR SHORTNESS OF BREATH  . aspirin EC 81 MG tablet Take 1 tablet (81 mg total) by mouth daily.  Marland Kitchen atorvastatin (LIPITOR) 40 MG tablet Take 1 tablet (40 mg total) by mouth daily at 6 PM.  . CALCIUM CARBONATE-VIT D-MIN PO Take 1,200 mg by mouth.  . Cholecalciferol (VITAMIN D3) 1000 units CAPS Take by mouth.  . cyclobenzaprine (FLEXERIL) 10 MG tablet Take 1 tablet (10 mg total) by mouth 3 (three) times daily as needed for muscle spasms.  . diclofenac sodium (VOLTAREN) 1 % GEL Apply 2 g topically 4 (four) times daily.  . folic acid (FOLVITE) 1 MG tablet TK 1 T PO  D  . gabapentin (NEURONTIN) 300 MG capsule Take 1 capsule (300 mg total) by mouth daily.  . hydroxychloroquine (PLAQUENIL) 200 MG tablet Take 200 mg by mouth 2 (two) times daily.  Marland Kitchen lisinopril-hydrochlorothiazide (PRINZIDE,ZESTORETIC) 10-12.5 MG tablet Take 1 tablet by mouth daily.  Marland Kitchen nystatin  cream (MYCOSTATIN) Apply 1 application topically 2 (two) times daily.  Marland Kitchen sulfaSALAzine (AZULFIDINE) 500 MG EC tablet Take by mouth.  . Tiotropium Bromide-Olodaterol (STIOLTO RESPIMAT) 2.5-2.5 MCG/ACT AERS Inhale 2 puffs into the lungs daily. (Patient not taking: Reported on 07/23/2018)  . Tiotropium Bromide-Olodaterol (STIOLTO RESPIMAT) 2.5-2.5 MCG/ACT AERS Inhale 2 puffs into the lungs daily.  . Tiotropium Bromide-Olodaterol (STIOLTO RESPIMAT) 2.5-2.5 MCG/ACT AERS Inhale 2 puffs into the lungs daily.  . [EXPIRED] ipratropium-albuterol (DUONEB) 0.5-2.5 (3) MG/3ML nebulizer solution 3 mL    No facility-administered encounter medications on file as of 07/23/2018.      Review of Systems  Review of Systems  Constitutional: Positive for fatigue. Negative for chills, fever and unexpected weight change.  HENT: Negative for congestion, ear pain, postnasal drip, sinus pressure and sinus pain.   Respiratory: Positive for cough (Occasional, sporadic with clear sputum, sputum production at baseline), shortness of breath (With exertion) and wheezing (With exertion). Negative for chest tightness.   Cardiovascular: Negative for chest pain and palpitations.  Gastrointestinal: Negative for diarrhea, nausea and vomiting.  Genitourinary: Negative for dysuria, frequency and urgency.  Musculoskeletal: Negative for arthralgias.  Skin: Negative for color change.  Allergic/Immunologic: Negative for environmental allergies and food allergies.  Neurological: Negative for dizziness, light-headedness and headaches.  Psychiatric/Behavioral: Negative for dysphoric mood. The patient is not nervous/anxious.   All other systems reviewed and are negative.    Physical Exam  BP 116/68 (BP Location: Left Arm, Cuff Size: Normal)   Pulse 84   Ht 5\' 6"  (1.676 m)   Wt 172 lb (78 kg)   SpO2 94%   BMI 27.76 kg/m   Wt Readings from Last 5 Encounters:  07/23/18 172 lb (78 kg)  04/23/18 174 lb 9.6 oz (79.2 kg)  03/19/18  173 lb 3.2 oz (78.6 kg)  03/16/18 172 lb 9.6 oz (78.3 kg)  11/27/17 185 lb 6.4 oz (84.1 kg)   RA  Physical Exam  Constitutional: She is oriented to person, place, and time and well-developed, well-nourished, and in no distress. Vital signs are normal. No distress.  + Elderly female  HENT:  Head: Normocephalic and atraumatic.  Right Ear: Hearing, tympanic membrane, external ear and ear canal normal.  Left Ear: Hearing, tympanic membrane, external ear and ear canal normal.  Nose: Nose normal. Right sinus exhibits no maxillary sinus tenderness and no frontal sinus tenderness. Left sinus exhibits no maxillary sinus tenderness and no frontal sinus tenderness.  Mouth/Throat: Uvula is midline and  oropharynx is clear and moist. No oropharyngeal exudate.  + Postnasal drip  Eyes: Pupils are equal, round, and reactive to light.  Neck: Normal range of motion. Neck supple. No JVD present.  Cardiovascular: Normal rate, regular rhythm and normal heart sounds.  Pulmonary/Chest: Effort normal. No accessory muscle usage. No respiratory distress. She has no decreased breath sounds. She has wheezes (Right upper lobe expiratory wheeze). She has no rhonchi.  Musculoskeletal: Normal range of motion.        General: No edema.  Lymphadenopathy:    She has no cervical adenopathy.  Neurological: She is alert and oriented to person, place, and time. Gait normal.  Skin: Skin is warm and dry. She is not diaphoretic. No erythema.  Psychiatric: Memory, affect and judgment normal. Her mood appears anxious.  Nursing note and vitals reviewed.   DuoNeb breathing treatment in office Respiratory: Resolved right upper lobe expiratory wheeze  Lab Results:  CBC    Component Value Date/Time   WBC 8.1 11/19/2016 1223   WBC 8.0 02/26/2016 1132   RBC 5.76 (H) 11/19/2016 1223   RBC 5.19 (H) 02/26/2016 1132   HGB 17.5 (H) 11/19/2016 1223   HCT 51.1 (H) 11/19/2016 1223   PLT 174 11/19/2016 1223   MCV 89 11/19/2016 1223     MCH 30.4 11/19/2016 1223   MCH 30.4 02/26/2016 1132   MCHC 34.2 11/19/2016 1223   MCHC 33.8 02/26/2016 1132   RDW 14.4 11/19/2016 1223   LYMPHSABS 2.9 11/19/2016 1223   MONOABS 720 02/26/2016 1132   EOSABS 0.2 11/19/2016 1223   BASOSABS 0.0 11/19/2016 1223    BMET    Component Value Date/Time   NA 138 11/19/2016 1223   K 4.9 11/19/2016 1223   CL 98 11/19/2016 1223   CO2 26 11/19/2016 1223   GLUCOSE 103 (H) 11/19/2016 1223   GLUCOSE 103 (H) 02/26/2016 1143   BUN 7 (L) 11/19/2016 1223   CREATININE 0.81 11/19/2016 1223   CREATININE 0.70 02/26/2016 1143   CALCIUM 10.1 11/19/2016 1223   GFRNONAA 78 11/19/2016 1223   GFRNONAA >89 08/23/2014 1506   GFRAA 89 11/19/2016 1223   GFRAA >89 08/23/2014 1506    BNP No results found for: BNP  ProBNP No results found for: PROBNP    Assessment & Plan:   62 year old female patient complaining follow-up with our office today unfortunately patient ran out of Stiolto which she felt like was better managing her breathing and she restarted [place] which she has known skin irritation from.  We will have patient stop [place] and restart Stiolto.  Sample provided today.  I patient coached on inhaler technique.  DuoNeb provided today.  Will have patient follow-up in 6 to 8 weeks after completing January/2020 CT.  Emphasized the importance of stopping smoking.  Patient and daughter are both interested in stopping smoking.  I will also refer patient to lung cancer screening program.  GOLD COPD III B Duoneb today   Restart Stiolto Respimat inhaler >>>2 puffs daily >>>Take this no matter what >>>This is not a rescue inhaler >>>sample provided today  >>>sent in to pharmacy   STOP [place]  Note your daily symptoms > remember "red flags" for COPD:   >>>Increase in cough >>>increase in sputum production >>>increase in shortness of breath or activity  intolerance.   If you notice these symptoms, please call the office to be seen.    Complete CT without contrast  >>>08/17/2018 11:30 AM at [place]  We  recommend that you stop smoking.   Follow up with Dr. Vaughan Browner in 6-8 weeks after CT completion   Tobacco abuse  We recommend that you stop smoking.  >>>You need to set a quit date >>>If you have friends or family who smoke, let them know you are trying to quit and not to smoke around you or in your living environment  Smoking Cessation Resources:  1 800 QUIT NOW  >>> Patient to call this resource and utilize it to help support her quit smoking >>> Keep up your hard work with stopping smoking  You can also contact the [facility] >>>For smoking cessation classes call [phone]  Nicotine patches: >>>Make sure you rotate sites that you do not get skin irritation, Apply 1 patch each morning to a non-hairy skin site  If you are smoking greater than 10 cigarettes/day and weigh over 45 kg start with the nicotine patch of 21 mg a day for 6 weeks, then 14 mg a day for 2 weeks, then finished with 7 mg a day for 2 weeks, then stop  If you are smoking less than 10 cigarettes a day or weight less than 45 kg start with medium dose pack of 14 mg a day for 6 weeks, followed by 7 mg a day for 2 weeks   >>>If insomnia occurs you are having trouble sleeping you can take the patch off at night, and place a new one on in the morning >>>If the patch is removed at night and you have morning cravings start short acting nicotine replacement therapy such as gum or lozenges  Nicotine Gum:  >>>Smokers who smoke 25 or more cigarettes a day should use 4 mg dose >>>Smokers who smoke fewer than 25 cigarettes a day should use 2 mg dose  Proper chewing of gum is important for optimal results.   >>>Do not chew gum to rapidly.  Once you start chewing eating tasty peppery taste and slide the gum to your cheek.  When the taste disappears to a few more times.  Repeat this for 30 minutes.  Then  discard the gum.  >>>Avoid acidic beverages such as coffee, carbonated beverages before and during gum use.  A soft acidic beverages lower oral pH which cause nicotine to not be absorbed properly >>>If you chew the gum too quickly or vigorously you could have nausea, vomiting, abdominal pain, constipation, hiccups, headache, sore jaw, mouth irritation ulcers  Nicotine lozenge: Lozenges are commonly uses short acting NRT product  >>>Smokers who smoke within 30 minutes of awakening should use 4 mg dose >>>Smokers who wait more than 30 minutes after awakening to smoke should use 2 mg dose  Can use up to 1 lozenge every 1-2 hours for 6 weeks >>>Total amount of lozenges that can be used per day as 20 >>>Gradually reduce number of lozenges used per day after 2 weeks of use  Place lozenge in mouth and allowed to dissolve for 30 minutes loss and does not need to be chewed  Lozenges have advantages to be able to be used in people with TMG, poor dentition, dentures  We do not recommend using e-cigarettes as a form of stopping smoking  You can sign up for smoking cessation support texts and information:  >>>https://smokefree.gov/smokefreetxt   We will refer you today to her lung cancer screening program >>>This is based off of your 49 pack-year smoking history >>> This is a recommendation from the [place] preventative services task force (USPSTF) >>>The USPSTF recommends annual  screening for lung cancer with low-dose computed tomography (LDCT) in adults aged 45 to 15 years who have a 30 pack-year smoking history and currently smoke or have quit within the past 15 years. Screening should be discontinued once a person has not smoked for 15 years or develops a health problem that substantially limits life expectancy or the ability or willingness to have curative lung surgery.   Our office will call you and set up an appointment with Eric Form (Nurse Practitioner) who leads this program.  This  appointment takes place in our office.  After completing this meeting with Eric Form NP you will get a low-dose CT as the screening >>>We will call you with those results  Follow up with Dr. Vaughan Browner in 6-8 weeks after CT completion   Lung nodules  Complete CT without contrast  >>>08/17/2018 11:30 AM at [place]  Follow up with Dr. Vaughan Browner in 6-8 weeks after CT completion      Lauraine Rinne, NP 07/23/2018   This appointment was 42 minutes along with over 50% of the time in direct face-to-face patient care, assessment, plan of care, and follow-up.

## 2018-07-23 ENCOUNTER — Encounter: Payer: Self-pay | Admitting: Pulmonary Disease

## 2018-07-23 ENCOUNTER — Ambulatory Visit (INDEPENDENT_AMBULATORY_CARE_PROVIDER_SITE_OTHER): Payer: Medicaid Other | Admitting: Pulmonary Disease

## 2018-07-23 VITALS — BP 116/68 | HR 84 | Ht 66.0 in | Wt 172.0 lb

## 2018-07-23 DIAGNOSIS — J432 Centrilobular emphysema: Secondary | ICD-10-CM

## 2018-07-23 DIAGNOSIS — F17210 Nicotine dependence, cigarettes, uncomplicated: Secondary | ICD-10-CM | POA: Diagnosis not present

## 2018-07-23 DIAGNOSIS — R918 Other nonspecific abnormal finding of lung field: Secondary | ICD-10-CM

## 2018-07-23 DIAGNOSIS — J449 Chronic obstructive pulmonary disease, unspecified: Secondary | ICD-10-CM

## 2018-07-23 DIAGNOSIS — Z720 Tobacco use: Secondary | ICD-10-CM

## 2018-07-23 MED ORDER — TIOTROPIUM BROMIDE-OLODATEROL 2.5-2.5 MCG/ACT IN AERS: 2.0000 | INHALATION_SPRAY | Freq: Every day | RESPIRATORY_TRACT | 0 refills | Status: DC

## 2018-07-23 MED ORDER — IPRATROPIUM-ALBUTEROL 0.5-2.5 (3) MG/3ML IN SOLN
3.0000 mL | Freq: Once | RESPIRATORY_TRACT | Status: AC
  Administered 2018-07-23: 3 mL via RESPIRATORY_TRACT

## 2018-07-23 MED ORDER — TIOTROPIUM BROMIDE-OLODATEROL 2.5-2.5 MCG/ACT IN AERS: 2.0000 | INHALATION_SPRAY | Freq: Every day | RESPIRATORY_TRACT | 3 refills | Status: DC

## 2018-07-23 NOTE — Progress Notes (Signed)
Patient seen in the office today and instructed on use of Stiolto.  Patient expressed understanding and demonstrated technique. 

## 2018-07-23 NOTE — Assessment & Plan Note (Signed)
We recommend that you stop smoking.  >>>You need to set a quit date >>>If you have friends or family who smoke, let them know you are trying to quit and not to smoke around you or in your living environment  Smoking Cessation Resources:  1 800 QUIT NOW  >>> Patient to call this resource and utilize it to help support her quit smoking >>> Keep up your hard work with stopping smoking  You can also contact the [facility] >>>For smoking cessation classes call [phone]  Nicotine patches: >>>Make sure you rotate sites that you do not get skin irritation, Apply 1 patch each morning to a non-hairy skin site  If you are smoking greater than 10 cigarettes/day and weigh over 45 kg start with the nicotine patch of 21 mg a day for 6 weeks, then 14 mg a day for 2 weeks, then finished with 7 mg a day for 2 weeks, then stop  If you are smoking less than 10 cigarettes a day or weight less than 45 kg start with medium dose pack of 14 mg a day for 6 weeks, followed by 7 mg a day for 2 weeks   >>>If insomnia occurs you are having trouble sleeping you can take the patch off at night, and place a new one on in the morning >>>If the patch is removed at night and you have morning cravings start short acting nicotine replacement therapy such as gum or lozenges  Nicotine Gum:  >>>Smokers who smoke 25 or more cigarettes a day should use 4 mg dose >>>Smokers who smoke fewer than 25 cigarettes a day should use 2 mg dose  Proper chewing of gum is important for optimal results.   >>>Do not chew gum to rapidly.  Once you start chewing eating tasty peppery taste and slide the gum to your cheek.  When the taste disappears to a few more times.  Repeat this for 30 minutes.  Then discard the gum.  >>>Avoid acidic beverages such as coffee, carbonated beverages before and during gum use.  A soft acidic beverages lower oral pH which cause nicotine to not be absorbed properly >>>If you chew the gum too  quickly or vigorously you could have nausea, vomiting, abdominal pain, constipation, hiccups, headache, sore jaw, mouth irritation ulcers  Nicotine lozenge: Lozenges are commonly uses short acting NRT product  >>>Smokers who smoke within 30 minutes of awakening should use 4 mg dose >>>Smokers who wait more than 30 minutes after awakening to smoke should use 2 mg dose  Can use up to 1 lozenge every 1-2 hours for 6 weeks >>>Total amount of lozenges that can be used per day as 20 >>>Gradually reduce number of lozenges used per day after 2 weeks of use  Place lozenge in mouth and allowed to dissolve for 30 minutes loss and does not need to be chewed  Lozenges have advantages to be able to be used in people with TMG, poor dentition, dentures  We do not recommend using e-cigarettes as a form of stopping smoking  You can sign up for smoking cessation support texts and information:  >>>https://smokefree.gov/smokefreetxt   We will refer you today to her lung cancer screening program >>>This is based off of your 49 pack-year smoking history >>> This is a recommendation from the [place] preventative services task force (USPSTF) >>>The USPSTF recommends annual screening for lung cancer with low-dose computed tomography (LDCT) in adults aged 48 to 80 years who have a 30 pack-year smoking history  and currently smoke or have quit within the past 15 years. Screening should be discontinued once a person has not smoked for 15 years or develops a health problem that substantially limits life expectancy or the ability or willingness to have curative lung surgery.   Our office will call you and set up an appointment with Eric Form (Nurse Practitioner) who leads this program.  This appointment takes place in our office.  After completing this meeting with Eric Form NP you will get a low-dose CT as the screening >>>We will call you with those results  Follow up with Dr. Vaughan Browner in 6-8 weeks after CT  completion

## 2018-07-23 NOTE — Assessment & Plan Note (Signed)
Duoneb today   Restart Stiolto Respimat inhaler >>>2 puffs daily >>>Take this no matter what >>>This is not a rescue inhaler >>>sample provided today  >>>sent in to pharmacy   STOP [place]  Note your daily symptoms > remember "red flags" for COPD:   >>>Increase in cough >>>increase in sputum production >>>increase in shortness of breath or activity  intolerance.   If you notice these symptoms, please call the office to be seen.   Complete CT without contrast  >>>08/17/2018 11:30 AM at [place]  We recommend that you stop smoking.   Follow up with Dr. Vaughan Browner in 6-8 weeks after CT completion

## 2018-07-23 NOTE — Patient Instructions (Addendum)
Duoneb today   Restart Stiolto Respimat inhaler >>>2 puffs daily >>>Take this no matter what >>>This is not a rescue inhaler >>>sample provided today  >>>sent in to pharmacy   STOP [facility]  Note your daily symptoms > remember "red flags" for COPD:   >>>Increase in cough >>>increase in sputum production >>>increase in shortness of breath or activity  intolerance.   If you notice these symptoms, please call the office to be seen.   Complete CT without contrast  >>>08/17/2018 11:30 AM at [facility]   We recommend that you stop smoking.  >>>You need to set a quit date >>>If you have friends or family who smoke, let them know you are trying to quit and not to smoke around you or in your living environment  Smoking Cessation Resources:  1 800 QUIT NOW  >>> Patient to call this resource and utilize it to help support her quit smoking >>> Keep up your hard work with stopping smoking  You can also contact the [facility] >>>For smoking cessation classes call [phone]  Nicotine patches: >>>Make sure you rotate sites that you do not get skin irritation, Apply 1 patch each morning to a non-hairy skin site  If you are smoking greater than 10 cigarettes/day and weigh over 45 kg start with the nicotine patch of 21 mg a day for 6 weeks, then 14 mg a day for 2 weeks, then finished with 7 mg a day for 2 weeks, then stop  If you are smoking less than 10 cigarettes a day or weight less than 45 kg start with medium dose pack of 14 mg a day for 6 weeks, followed by 7 mg a day for 2 weeks   >>>If insomnia occurs you are having trouble sleeping you can take the patch off at night, and place a new one on in the morning >>>If the patch is removed at night and you have morning cravings start short acting nicotine replacement therapy such as gum or lozenges  Nicotine Gum:  >>>Smokers who smoke 25 or more cigarettes a day should use 4 mg  dose >>>Smokers who smoke fewer than 25 cigarettes a day should use 2 mg dose  Proper chewing of gum is important for optimal results.   >>>Do not chew gum to rapidly.  Once you start chewing eating tasty peppery taste and slide the gum to your cheek.  When the taste disappears to a few more times.  Repeat this for 30 minutes.  Then discard the gum.  >>>Avoid acidic beverages such as coffee, carbonated beverages before and during gum use.  A soft acidic beverages lower oral pH which cause nicotine to not be absorbed properly >>>If you chew the gum too quickly or vigorously you could have nausea, vomiting, abdominal pain, constipation, hiccups, headache, sore jaw, mouth irritation ulcers  Nicotine lozenge: Lozenges are commonly uses short acting NRT product  >>>Smokers who smoke within 30 minutes of awakening should use 4 mg dose >>>Smokers who wait more than 30 minutes after awakening to smoke should use 2 mg dose  Can use up to 1 lozenge every 1-2 hours for 6 weeks >>>Total amount of lozenges that can be used per day as 20 >>>Gradually reduce number of lozenges used per day after 2 weeks of use  Place lozenge in mouth and allowed to dissolve for 30 minutes loss and does not need to be chewed  Lozenges have advantages to be able to be used in  people with TMG, poor dentition, dentures  We do not recommend using e-cigarettes as a form of stopping smoking  You can sign up for smoking cessation support texts and information:  >>>https://smokefree.gov/smokefreetxt   We will refer you today to her lung cancer screening program >>>This is based off of your 49 pack-year smoking history >>> This is a recommendation from the [place] preventative services task force (USPSTF) >>>The USPSTF recommends annual screening for lung cancer with low-dose computed tomography (LDCT) in adults aged 59 to 80 years who have a 30 pack-year smoking history and currently smoke or have quit within the past 15 years.  Screening should be discontinued once a person has not smoked for 15 years or develops a health problem that substantially limits life expectancy or the ability or willingness to have curative lung surgery.   Our office will call you and set up an appointment with Eric Form (Nurse Practitioner) who leads this program.  This appointment takes place in our office.  After completing this meeting with Eric Form NP you will get a low-dose CT as the screening >>>We will call you with those results    Follow up with Dr. Vaughan Browner in 6-8 weeks after CT completion     It is flu season:   >>>Remember to be washing your hands regularly, using hand sanitizer, be careful to use around herself with has contact with people who are sick will increase her chances of getting sick yourself. >>> Best ways to protect herself from the flu: Receive the yearly flu vaccine, practice good hand hygiene washing with soap and also using hand sanitizer when available, eat a nutritious meals, get adequate rest, hydrate appropriately   Please contact the office if your symptoms worsen or you have concerns that you are not improving.   Thank you for choosing [facility] for your healthcare, and for allowing [place] to partner with you on your healthcare journey. [name] thankful to be able to provide care to you today.   Wyn Quaker FNP-C    Health Risks of Smoking Smoking cigarettes is very bad for your health. Tobacco smoke has over 200 known poisons in it. It contains the poisonous gases nitrogen oxide and carbon monoxide. There are over 60 chemicals in tobacco smoke that cause cancer. Smoking is difficult to quit because a chemical in tobacco, called nicotine, causes addiction or dependence. When you smoke and inhale, nicotine is absorbed rapidly into the bloodstream through your lungs. Both inhaled and non-inhaled nicotine may be addictive. What are the risks of cigarette smoke? Cigarette smokers have an  increased risk of many serious medical problems, including:  Lung cancer.  Lung disease, such as pneumonia, bronchitis, and emphysema.  Chest pain (angina) and heart attack because the heart is not getting enough oxygen.  Heart disease and peripheral blood vessel disease.  High blood pressure (hypertension).  Stroke.  Oral cancer, including cancer of the lip, mouth, or voice box.  Bladder cancer.  Pancreatic cancer.  Cervical cancer.  Pregnancy complications, including premature birth.  Stillbirths and smaller newborn babies, birth defects, and genetic damage to sperm.  Early menopause.  Lower estrogen level for women.  Infertility.  Facial wrinkles.  Blindness.  Increased risk of broken bones (fractures).  Senile dementia.  Stomach ulcers and internal bleeding.  Delayed wound healing and increased risk of complications during surgery.  Even smoking lightly shortens your life expectancy by several years. Because of secondhand smoke exposure, children of smokers have an increased risk  of the following:  Sudden infant death syndrome (SIDS).  Respiratory infections.  Lung cancer.  Heart disease.  Ear infections. What are the benefits of quitting? There are many health benefits of quitting smoking. Here are some of them:  Within days of quitting smoking, your risk of having a heart attack decreases, your blood flow improves, and your lung capacity improves. Blood pressure, pulse rate, and breathing patterns start returning to normal soon after quitting.  Within months, your lungs may clear up completely.  Quitting for 10 years reduces your risk of developing lung cancer and heart disease to almost that of a nonsmoker.  People who quit may see an improvement in their overall quality of life. How do I quit smoking?     Smoking is an addiction with both physical and psychological effects, and longtime habits can be hard to change. Your health care  provider can recommend:  Programs and community resources, which may include group support, education, or talk therapy.  Prescription medicines to help reduce cravings.  Nicotine replacement products, such as patches, gum, and nasal sprays. Use these products only as directed. Do not replace cigarette smoking with electronic cigarettes, which are commonly called e-cigarettes. The safety of e-cigarettes is not known, and some may contain harmful chemicals.  A combination of two or more of these methods. Where to find more information  American Lung Association: www.lung.org  American Cancer Society: www.cancer.org Summary  Smoking cigarettes is very bad for your health. Cigarette smokers have an increased risk of many serious medical problems, including several cancers, heart disease, and stroke.  Smoking is an addiction with both physical and psychological effects, and longtime habits can be hard to change.  By stopping right away, you can greatly reduce the risk of medical problems for you and your family.  To help you quit smoking, your health care provider can recommend programs, community resources, prescription medicines, and nicotine replacement products such as patches, gum, and nasal sprays. This information is not intended to replace advice given to you by your health care provider. Make sure you discuss any questions you have with your health care provider. Document Released: 08/29/2004 Document Revised: 10/23/2017 Document Reviewed: 07/26/2016 Elsevier Interactive Patient Education  2019 [employer].  Steps to Quit Smoking  Smoking tobacco can be bad for your health. It can also affect almost every organ in your body. Smoking puts you and people around you at risk for many serious long-lasting (chronic) diseases. Quitting smoking is hard, but it is one of the best things that you can do for your health. It is never too late to quit. What are the benefits of quitting  smoking? When you quit smoking, you lower your risk for getting serious diseases and conditions. They can include:  Lung cancer or lung disease.  Heart disease.  Stroke.  Heart attack.  Not being able to have children (infertility).  Weak bones (osteoporosis) and broken bones (fractures). If you have coughing, wheezing, and shortness of breath, those symptoms may get better when you quit. You may also get sick less often. If you are pregnant, quitting smoking can help to lower your chances of having a baby of low birth weight. What can I do to help me quit smoking? Talk with your doctor about what can help you quit smoking. Some things you can do (strategies) include:  Quitting smoking totally, instead of slowly cutting back how much you smoke over a period of time.  Going to in-person counseling. You are  more likely to quit if you go to many counseling sessions.  Using resources and support systems, such as: ? Database administrator with a Social worker. ? Phone quitlines. ? Careers information officer. ? Support groups or group counseling. ? Text messaging programs. ? Mobile phone apps or applications.  Taking medicines. Some of these medicines may have nicotine in them. If you are pregnant or breastfeeding, do not take any medicines to quit smoking unless your doctor says it is okay. Talk with your doctor about counseling or other things that can help you. Talk with your doctor about using more than one strategy at the same time, such as taking medicines while you are also going to in-person counseling. This can help make quitting easier. What things can I do to make it easier to quit? Quitting smoking might feel very hard at first, but there is a lot that you can do to make it easier. Take these steps:  Talk to your family and friends. Ask them to support and encourage you.  Call phone quitlines, reach out to support groups, or work with a Social worker.  Ask people who smoke to not smoke  around you.  Avoid places that make you want (trigger) to smoke, such as: ? Bars. ? Parties. ? Smoke-break areas at work.  Spend time with people who do not smoke.  Lower the stress in your life. Stress can make you want to smoke. Try these things to help your stress: ? Getting regular exercise. ? Deep-breathing exercises. ? Yoga. ? Meditating. ? Doing a body scan. To do this, close your eyes, focus on one area of your body at a time from head to toe, and notice which parts of your body are tense. Try to relax the muscles in those areas.  Download or buy apps on your mobile phone or tablet that can help you stick to your quit plan. There are many free apps, such as QuitGuide from the [employer] Office manager for Disease Control and Prevention). You can find more support from smokefree.gov and other websites. This information is not intended to replace advice given to you by your health care provider. Make sure you discuss any questions you have with your health care provider. Document Released: 05/18/2009 Document Revised: 03/19/2016 Document Reviewed: 12/06/2014 Elsevier Interactive Patient Education  2019 [employer].

## 2018-07-23 NOTE — Assessment & Plan Note (Signed)
  Complete CT without contrast  >>>08/17/2018 11:30 AM at [facility]  Follow up with Dr. Vaughan Browner in 6-8 weeks after CT completion

## 2018-08-06 ENCOUNTER — Encounter: Payer: Self-pay | Admitting: Internal Medicine

## 2018-08-06 ENCOUNTER — Ambulatory Visit: Payer: Medicare Other | Attending: Internal Medicine | Admitting: Internal Medicine

## 2018-08-06 VITALS — BP 126/71 | HR 74 | Temp 97.4°F | Resp 16 | Wt 171.8 lb

## 2018-08-06 DIAGNOSIS — K7469 Other cirrhosis of liver: Secondary | ICD-10-CM | POA: Diagnosis not present

## 2018-08-06 DIAGNOSIS — F17200 Nicotine dependence, unspecified, uncomplicated: Secondary | ICD-10-CM

## 2018-08-06 DIAGNOSIS — Z888 Allergy status to other drugs, medicaments and biological substances status: Secondary | ICD-10-CM | POA: Diagnosis not present

## 2018-08-06 DIAGNOSIS — J432 Centrilobular emphysema: Secondary | ICD-10-CM

## 2018-08-06 DIAGNOSIS — F17210 Nicotine dependence, cigarettes, uncomplicated: Secondary | ICD-10-CM | POA: Diagnosis not present

## 2018-08-06 DIAGNOSIS — Z8619 Personal history of other infectious and parasitic diseases: Secondary | ICD-10-CM | POA: Diagnosis not present

## 2018-08-06 DIAGNOSIS — Z885 Allergy status to narcotic agent status: Secondary | ICD-10-CM | POA: Insufficient documentation

## 2018-08-06 DIAGNOSIS — M069 Rheumatoid arthritis, unspecified: Secondary | ICD-10-CM | POA: Diagnosis not present

## 2018-08-06 DIAGNOSIS — R413 Other amnesia: Secondary | ICD-10-CM

## 2018-08-06 DIAGNOSIS — I2510 Atherosclerotic heart disease of native coronary artery without angina pectoris: Secondary | ICD-10-CM | POA: Diagnosis not present

## 2018-08-06 DIAGNOSIS — M797 Fibromyalgia: Secondary | ICD-10-CM | POA: Insufficient documentation

## 2018-08-06 DIAGNOSIS — Z79899 Other long term (current) drug therapy: Secondary | ICD-10-CM | POA: Diagnosis not present

## 2018-08-06 DIAGNOSIS — Z8249 Family history of ischemic heart disease and other diseases of the circulatory system: Secondary | ICD-10-CM | POA: Insufficient documentation

## 2018-08-06 DIAGNOSIS — J449 Chronic obstructive pulmonary disease, unspecified: Secondary | ICD-10-CM | POA: Diagnosis not present

## 2018-08-06 DIAGNOSIS — Z7982 Long term (current) use of aspirin: Secondary | ICD-10-CM | POA: Diagnosis not present

## 2018-08-06 DIAGNOSIS — I10 Essential (primary) hypertension: Secondary | ICD-10-CM | POA: Diagnosis not present

## 2018-08-06 DIAGNOSIS — E7800 Pure hypercholesterolemia, unspecified: Secondary | ICD-10-CM | POA: Insufficient documentation

## 2018-08-06 DIAGNOSIS — H539 Unspecified visual disturbance: Secondary | ICD-10-CM | POA: Insufficient documentation

## 2018-08-06 MED ORDER — BUPROPION HCL 100 MG PO TABS: 100.0000 mg | ORAL_TABLET | Freq: Two times a day (BID) | ORAL | 3 refills | Status: DC

## 2018-08-06 NOTE — Progress Notes (Signed)
44

## 2018-08-06 NOTE — Progress Notes (Signed)
Patient ID: MYLEI BRACKEEN, female    DOB: 26-Nov-1955  MRN: [id number]  CC: Hypertension   Subjective: Raven Torres is a 63 y.o. female who presents for chronic disease management Her concerns today include:  63 year old female with history of HTN, cured hepatitis C with cirrhosis, adjustment disorder, HL, tobacco dependence, and rheumatoid arthritis/fibromyalgia followed by rheumatology at [facility], latent TB currently on INH, lung nodules   Memory:  Getting worse and noticeable by her daughter. Daughter gets agitated when she ask the same question repeatedly.  On last visit we had plan to refer to neurology but I forgot to submit the referral.  Remains independent in ADLs.  Reports good appetite.  Sleeping okay.  COPD GOLD III:  Saw Pulmonary 07/2018.   Still smoking.  Used patches in past and did not find it helpful.  Would like to try Wellbutrin.  Afraid to try Chantix because of possible side effects.  Request referral for eye exam.  Had appt over the holidays but she cancelled.  Does not want to go back to Dr. Schuyler Amor.  Hx of cataract removed with lens placement about 10 yrs ago.  Feels that her vision has gradually deteriorated.  Hep C with Cirrhosis, cured:  Had liver US 03/2018 that was negative for any liver lesions..   HTN;  Compliant with Lis/HCTZ.  She tries to limit salt in the foods.  Rheumatoid arthritis/fibromyalgia.  Saw her rheumatologist through [facility] since last visit with me.  She had lab studies done the results of which I have reviewed on Care Everywhere she remains on Plaquenil and sulfasalazine.  Reports that she no longer takes Flexeril. Patient Active Problem List   Diagnosis Date Noted  . GOLD COPD III B 04/23/2018  . Pruritic dermatitis 04/23/2018  . High cholesterol 06/24/2017  . Shortness of breath 06/04/2017  . Coronary artery calcification 06/04/2017  . Other fatigue 06/04/2017  . Centrilobular emphysema ([place]) 05/15/2017  . Atherosclerosis  05/15/2017  . Memory change 05/15/2017  . TB lung, latent 04/16/2017  . Lung nodules 04/16/2017  . Diabetes mellitus screening 11/19/2016  . Fibromyalgia 05/15/2015  . Rheumatoid arthritis ([place]) 08/22/2014  . HTN (hypertension) 08/22/2014  . Tobacco abuse 08/22/2014  . Hepatic cirrhosis ([place]) 05/31/2014  . Chronic hepatitis C virus infection ([place]) 05/03/2014     Current Outpatient Medications on File Prior to Visit  Medication Sig Dispense Refill  . Aclidinium Bromide (TUDORZA PRESSAIR) 400 MCG/ACT AEPB Inhale 1 puff into the lungs 2 (two) times daily for 1 day. 1 each 0  . Aclidinium Bromide (TUDORZA PRESSAIR) 400 MCG/ACT AEPB Inhale 1 puff into the lungs daily. (Patient not taking: Reported on 08/06/2018) 1 each 5  . albuterol (PROAIR HFA) 108 (90 Base) MCG/ACT inhaler INHALE 2 PUFFS INTO THE LUNGS EVERY 6 HOURS AS NEEDED FOR WHEEZING OR SHORTNESS OF BREATH (Patient not taking: Reported on 08/06/2018) 8.5 g 0  . aspirin EC 81 MG tablet Take 1 tablet (81 mg total) by mouth daily. 30 tablet 0  . atorvastatin (LIPITOR) 40 MG tablet Take 1 tablet (40 mg total) by mouth daily at 6 PM. 90 tablet 3  . CALCIUM CARBONATE-VIT D-MIN PO Take 1,200 mg by mouth.    . Cholecalciferol (VITAMIN D3) 1000 units CAPS Take by mouth.    . cyclobenzaprine (FLEXERIL) 10 MG tablet Take 1 tablet (10 mg total) by mouth 3 (three) times daily as needed for muscle spasms. (Patient not taking: Reported on 08/06/2018) 30 tablet 3  .  diclofenac sodium (VOLTAREN) 1 % GEL Apply 2 g topically 4 (four) times daily. 1 Tube 2  . folic acid (FOLVITE) 1 MG tablet TK 1 T PO  D  3  . gabapentin (NEURONTIN) 300 MG capsule Take 1 capsule (300 mg total) by mouth daily. (Patient not taking: Reported on 08/06/2018) 90 capsule 3  . hydroxychloroquine (PLAQUENIL) 200 MG tablet Take 200 mg by mouth 2 (two) times daily.    Marland Kitchen lisinopril-hydrochlorothiazide (PRINZIDE,ZESTORETIC) 10-12.5 MG tablet Take 1 tablet by mouth daily. 90 tablet 1  .  nystatin cream (MYCOSTATIN) Apply 1 application topically 2 (two) times daily. (Patient not taking: Reported on 08/06/2018) 30 g 0  . sulfaSALAzine (AZULFIDINE) 500 MG EC tablet Take by mouth.    . Tiotropium Bromide-Olodaterol (STIOLTO RESPIMAT) 2.5-2.5 MCG/ACT AERS Inhale 2 puffs into the lungs daily. (Patient not taking: Reported on 07/23/2018) 1 Inhaler 0  . Tiotropium Bromide-Olodaterol (STIOLTO RESPIMAT) 2.5-2.5 MCG/ACT AERS Inhale 2 puffs into the lungs daily. 1 Inhaler 3  . Tiotropium Bromide-Olodaterol (STIOLTO RESPIMAT) 2.5-2.5 MCG/ACT AERS Inhale 2 puffs into the lungs daily. (Patient not taking: Reported on 08/06/2018) 1 Inhaler 0   No current facility-administered medications on file prior to visit.     Allergies  Allergen Reactions  . Tudorza Pressair [Aclidinium Bromide] Itching  . Amitriptyline Other (See Comments)    Eye spasms per pt  . Codeine Nausea Only  . Tramadol Nausea Only    Social History   Socioeconomic History  . Marital status: Married    Spouse name: Not on file  . Number of children: Not on file  . Years of education: Not on file  . Highest education level: Not on file  Occupational History  . Not on file  Social Needs  . Financial resource strain: Not on file  . Food insecurity:    Worry: Not on file    Inability: Not on file  . Transportation needs:    Medical: Not on file    Non-medical: Not on file  Tobacco Use  . Smoking status: Current Every Day Smoker    Packs/day: 1.00    Years: 49.00    Pack years: 49.00    Types: Cigarettes  . Smokeless tobacco: Never Used  . Tobacco comment: smoking 10 cigs daily, down to 6 cigs a day 04/23/18  Substance and Sexual Activity  . Alcohol use: No    Alcohol/week: 0.0 standard drinks  . Drug use: No  . Sexual activity: Never  Lifestyle  . Physical activity:    Days per week: Not on file    Minutes per session: Not on file  . Stress: Not on file  Relationships  . Social connections:    Talks on  phone: Not on file    Gets together: Not on file    Attends religious service: Not on file    Active member of club or organization: Not on file    Attends meetings of clubs or organizations: Not on file    Relationship status: Not on file  . Intimate partner violence:    Fear of current or ex partner: Not on file    Emotionally abused: Not on file    Physically abused: Not on file    Forced sexual activity: Not on file  Other Topics Concern  . Not on file  Social History Narrative  . Not on file    Family History  Problem Relation Age of Onset  . Heart disease Mother   .  Cancer Mother     Past Surgical History:  Procedure Laterality Date  . ABDOMINAL HYSTERECTOMY    . KNEE SURGERY Right     ROS: Review of Systems Negative except as above. PHYSICAL EXAM: BP 126/71   Pulse 74   Temp (!) 97.4 F (36.3 C) (Oral)   Resp 16   Wt 171 lb 12.8 oz (77.9 kg)   SpO2 96%   BMI 27.73 kg/m   Wt Readings from Last 3 Encounters:  08/06/18 171 lb 12.8 oz (77.9 kg)  07/23/18 172 lb (78 kg)  04/23/18 174 lb 9.6 oz (79.2 kg)    Physical Exam  General appearance - alert, well appearing, pleasant elderly female who appears older than stated age and in no distress Mental status -patient is oriented to month, year and day of the week.  She was able to accurately state who her current president is.   Mouth - mucous membranes moist, pharynx normal without lesions Neck - supple, no significant adenopathy Chest -few scattered rhonchi's that cleared with coughing. Heart - normal rate, regular rhythm, normal S1, S2, no murmurs, rubs, clicks or gallops Extremities -no lower extremity edema.  ASSESSMENT AND PLAN: 1. Essential hypertension At goal.  Continue lisinopril/HCTZ.  2. Poor memory - Ambulatory referral to Neurology  3. Tobacco dependence Advised to quit.  Discussed methods to help her quit.  She is wanting to try Wellbutrin.  Less than 5 minutes spent on counseling. -  buPROPion (WELLBUTRIN) 100 MG tablet; Take 1 tablet (100 mg total) by mouth 2 (two) times daily.  Dispense: 60 tablet; Refill: 3  4. Centrilobular emphysema (HCC) Followed by pulmonary.  5. Rheumatoid arthritis involving multiple sites, unspecified rheumatoid factor presence ([place]) Followed by rheumatology.  6. Other cirrhosis of liver (HCC) Clinically stable.  Last liver ultrasound done less than 6 months ago was okay.  7. Vision changes - Ambulatory referral to Ophthalmology  Patient was given the opportunity to ask questions.  Patient verbalized understanding of the plan and was able to repeat key elements of the plan.   No orders of the defined types were placed in this encounter.    Requested Prescriptions    No prescriptions requested or ordered in this encounter    No follow-ups on file.  Karle Plumber, MD, FACP

## 2018-08-06 NOTE — Patient Instructions (Signed)
I have sent a prescription to your pharmacy for the Wellbutrin to help decrease your cravings for cigarettes.  I have submitted a referral for you to see an eye doctor.  I have submitted a referral to the neurologist for further evaluation of poor memory.

## 2018-08-10 ENCOUNTER — Other Ambulatory Visit: Payer: Self-pay | Admitting: Internal Medicine

## 2018-08-11 DIAGNOSIS — M0579 Rheumatoid arthritis with rheumatoid factor of multiple sites without organ or systems involvement: Secondary | ICD-10-CM | POA: Diagnosis not present

## 2018-08-11 DIAGNOSIS — M797 Fibromyalgia: Secondary | ICD-10-CM | POA: Diagnosis not present

## 2018-08-11 DIAGNOSIS — Z79899 Other long term (current) drug therapy: Secondary | ICD-10-CM | POA: Diagnosis not present

## 2018-08-17 ENCOUNTER — Inpatient Hospital Stay: Admission: RE | Admit: 2018-08-17 | Payer: Medicaid Other | Source: Ambulatory Visit

## 2018-08-27 ENCOUNTER — Other Ambulatory Visit: Payer: Self-pay | Admitting: Acute Care

## 2018-08-27 DIAGNOSIS — F17210 Nicotine dependence, cigarettes, uncomplicated: Principal | ICD-10-CM

## 2018-08-27 DIAGNOSIS — Z87891 Personal history of nicotine dependence: Secondary | ICD-10-CM

## 2018-08-27 DIAGNOSIS — Z122 Encounter for screening for malignant neoplasm of respiratory organs: Secondary | ICD-10-CM

## 2018-09-16 ENCOUNTER — Ambulatory Visit (INDEPENDENT_AMBULATORY_CARE_PROVIDER_SITE_OTHER): Payer: Medicare Other | Admitting: Acute Care

## 2018-09-16 ENCOUNTER — Telehealth: Payer: Self-pay | Admitting: Acute Care

## 2018-09-16 ENCOUNTER — Ambulatory Visit (INDEPENDENT_AMBULATORY_CARE_PROVIDER_SITE_OTHER)
Admission: RE | Admit: 2018-09-16 | Discharge: 2018-09-16 | Disposition: A | Discharge status: Home / Self Care | Payer: Medicare Other | Source: Ambulatory Visit | Attending: Acute Care | Admitting: Acute Care

## 2018-09-16 ENCOUNTER — Encounter: Payer: Self-pay | Admitting: Acute Care

## 2018-09-16 VITALS — BP 100/60 | HR 88 | Ht 66.0 in | Wt 162.4 lb

## 2018-09-16 DIAGNOSIS — Z122 Encounter for screening for malignant neoplasm of respiratory organs: Secondary | ICD-10-CM

## 2018-09-16 DIAGNOSIS — F17210 Nicotine dependence, cigarettes, uncomplicated: Secondary | ICD-10-CM | POA: Diagnosis not present

## 2018-09-16 DIAGNOSIS — Z87891 Personal history of nicotine dependence: Secondary | ICD-10-CM | POA: Diagnosis not present

## 2018-09-16 NOTE — Progress Notes (Signed)
Shared Decision Making Visit Lung Cancer Screening Program [phone])   Eligibility:  Age 63 y.o.  Pack Years Smoking History Calculation 50 pack year smoking history (# packs/per year x # years smoked)  Recent History of coughing up blood  no  Unexplained weight loss? no ( >Than 15 pounds within the last 6 months )  Prior History Lung / other cancer no (Diagnosis within the last 5 years already requiring surveillance chest CT Scans).  Smoking Status Current Smoker  Former Smokers: Years since quit: NA  Quit Date: NA  Visit Components:  Discussion included one or more decision making aids. yes  Discussion included risk/benefits of screening. yes  Discussion included potential follow up diagnostic testing for abnormal scans. yes  Discussion included meaning and risk of over diagnosis. yes  Discussion included meaning and risk of False Positives. yes  Discussion included meaning of total radiation exposure. yes  Counseling Included:  Importance of adherence to annual lung cancer LDCT screening. yes  Impact of comorbidities on ability to participate in the program. yes  Ability and willingness to under diagnostic treatment. yes  Smoking Cessation Counseling:  Current Smokers:   Discussed importance of smoking cessation. yes  Information about tobacco cessation classes and interventions provided to patient. yes  Patient provided with "ticket" for LDCT Scan. yes  Symptomatic Patient. no  Counseling  Diagnosis Code: Tobacco Use Z72.0  Asymptomatic Patient yes  Counseling (Intermediate counseling: > three minutes counseling) D3267  Former Smokers:   Discussed the importance of maintaining cigarette abstinence. yes  Diagnosis Code: Personal History of Nicotine Dependence. T24.580  Information about tobacco cessation classes and interventions provided to patient. Yes  Patient provided with "ticket" for LDCT Scan. yes  Written Order for Lung Cancer  Screening with LDCT placed in Epic. Yes (CT Chest Lung Cancer Screening Low Dose W/O CM) [id number] Z12.2-Screening of respiratory organs Z87.891-Personal history of nicotine dependence  I have spent 25 minutes of face to face time with Ms. Malay discussing the risks and benefits of lung cancer screening. We viewed a power point together that explained in detail the above noted topics. We paused at intervals to allow for questions to be asked and answered to ensure understanding.We discussed that the single most powerful action that she can take to decrease her risk of developing lung cancer is to quit smoking. We discussed whether or not she is ready to commit to setting a quit date. We discussed options for tools to aid in quitting smoking including nicotine replacement therapy, non-nicotine medications, support groups, Quit Smart classes, and behavior modification. We discussed that often times setting smaller, more achievable goals, such as eliminating 1 cigarette a day for a week and then 2 cigarettes a day for a week can be helpful in slowly decreasing the number of cigarettes smoked. This allows for a sense of accomplishment as well as providing a clinical benefit. I gave her the " Be Stronger Than Your Excuses" card with contact information for community resources, classes, free nicotine replacement therapy, and access to mobile apps, text messaging, and on-line smoking cessation help. I have also given her my card and contact information in the event she needs to contact me. We discussed the time and location of the scan, and that either Doroteo Glassman RN or I will call with the results within 24-48 hours of receiving them. I have offered her  a copy of the power point we viewed  as a resource in the event they need reinforcement of  the concepts we discussed today in the office. The patient verbalized understanding of all of  the above and had no further questions upon leaving the office. They have my  contact information in the event they have any further questions.  I spent 3 minutes counseling on smoking cessation and the health risks of continued tobacco abuse.  I explained to the patient that there has been a high incidence of coronary artery disease noted on these exams. I explained that this is a non-gated exam therefore degree or severity cannot be determined. This patient is currently on statin therapy. I have asked the patient to follow-up with their PCP regarding any incidental finding of coronary artery disease and management with diet or medication as their PCP  feels is clinically indicated. The patient verbalized understanding of the above and had no further questions upon completion of the visit.  I congratulated patient on weaning herself to 5 cigarettes a week, and encouraged her to continue to work on reaching a goal of total smoking cessation. She is doing great.   I advised patient to try Mucinex for congestion. She verbalized understanding.   Magdalen Spatz, NP 09/16/2018 9:13 AM

## 2018-09-16 NOTE — Telephone Encounter (Signed)
Call report  Lung Screening  FINDINGS: Cardiovascular: The heart is normal in size. No pericardial effusion.  No evidence of thoracic aortic aneurysm. Atherosclerotic calcifications of the aortic arch.  Three vessel coronary atherosclerosis.  Mediastinum/Nodes: Small mediastinal lymph nodes which do not meet pathologic CT size criteria.  Visualized thyroid is unremarkable.  Lungs/Pleura: Biapical pleural-parenchymal scarring.  Moderate centrilobular and paraseptal emphysematous changes, upper lobe predominant.  No focal consolidation.  Numerous bilateral pulmonary nodules, many of which are subpleural and likely benign.  8.7 mm irregular nodule in the posterior right upper lobe (image 119) is new from the prior. A 6.9 mm irregular nodule in the anterior right upper lobe (image 105) is also new from the prior. A 5.6 mm cavitary nodule in the posterior right lower lobe (image 233) is likely new or at least increased. Differential considerations include atypical mycobacterial infection (favored) or less likely metastases. Waxing/waning on prior studies favors atypical mycobacterial infection as the underlying etiology. In this context, it is unclear that this is an appropriate patient for lung cancer screening.  No pleural effusion or pneumothorax.  Upper Abdomen: Visualized upper abdomen is notable for cirrhosis, vascular calcifications, and a 3 mm nonobstructing right upper pole renal calculus.  Musculoskeletal: Visualized osseous structures are within normal limits.  IMPRESSION: Lung-RADS 4A, suspicious.  Multiple new right lung nodules, including a dominant 8.7 mm irregular nodule in the posterior right upper lobe and a 5.6 mm cavitary nodule in the right lower lobe. Waxing/waning nodules were present on recent priors, favoring a benign etiology such as atypical mycobacterial infection. Given this, it is unclear that this is an appropriate patient  for lung cancer screening.  Follow-up CT chest is suggested in 3 months (after appropriate antimicrobial therapy).  These results will be called to the ordering clinician or representative by the Radiologist Assistant, and communication documented in the PACS or zVision Dashboard.  Aortic Atherosclerosis (ICD10-I70.0) and Emphysema (ICD10-J43.9).

## 2018-09-16 NOTE — Telephone Encounter (Signed)
Noted. Will wait on patient to be called.

## 2018-09-16 NOTE — Telephone Encounter (Signed)
These results will be called to the patient. Thanks

## 2018-09-17 ENCOUNTER — Other Ambulatory Visit: Payer: Self-pay | Admitting: Acute Care

## 2018-09-17 DIAGNOSIS — Z122 Encounter for screening for malignant neoplasm of respiratory organs: Secondary | ICD-10-CM

## 2018-09-17 DIAGNOSIS — Z87891 Personal history of nicotine dependence: Secondary | ICD-10-CM

## 2018-09-17 DIAGNOSIS — F17210 Nicotine dependence, cigarettes, uncomplicated: Principal | ICD-10-CM

## 2018-09-17 NOTE — Telephone Encounter (Signed)
Pt is returning call about results. Cb is [phone]

## 2018-09-17 NOTE — Telephone Encounter (Signed)
Spoke with pt and scheduled appt with Eric Form, NP 09/23/18 3:30.  Results of chest ct faxed to PCP.  Order placed for 3 month f/u Chest Ct.

## 2018-09-17 NOTE — Telephone Encounter (Signed)
I have called Raven Torres. I explained that her scan was read as a Lung RADS 4 A : suspicious findings, either short term follow up in 3 months or alternatively  PET Scan evaluation may be considered when there is a solid component of  8 mm or larger. I also told her that the radiologist feels this is more likely an atypical mycobacterial infection as the underlying etiology. We will have her come into the office for a follow up visit. Plan will be sputum collection for culture, AFB and fungal studies. Additionally we will provide her with flutter valve and IS and instruct her on use.   Triage, please schedule patient for Wednesday 2/19 in the afternoon with me for follow up. I told her someone would give her a call with the times. Thanks so much.  Langley Gauss , we will still do the 3 month follow up scan.   Thanks everyone!!

## 2018-09-18 ENCOUNTER — Other Ambulatory Visit: Payer: Self-pay | Admitting: Internal Medicine

## 2018-09-18 ENCOUNTER — Telehealth: Payer: Self-pay | Admitting: Pulmonary Disease

## 2018-09-18 ENCOUNTER — Other Ambulatory Visit: Payer: Medicare Other

## 2018-09-18 DIAGNOSIS — J449 Chronic obstructive pulmonary disease, unspecified: Secondary | ICD-10-CM

## 2018-09-18 MED ORDER — LISINOPRIL-HYDROCHLOROTHIAZIDE 10-12.5 MG PO TABS: 1.0000 | ORAL_TABLET | Freq: Every day | ORAL | 2 refills | Status: DC

## 2018-09-18 NOTE — Telephone Encounter (Signed)
Raven Torres called this morning asking if any sputum orders have been placed for patient Raven Spatz, NP  10:54 AM  Note    I have called Raven Torres. I explained that her scan was read as a Lung RADS 4 A : suspicious findings, either short term follow up in 3 months or alternatively  PET Scan evaluation may be considered when there is a solid component of  8 mm or larger. I also told her that the radiologist feels this is more likely an atypical mycobacterial infection as the underlying etiology. We will have her come into the office for a follow up visit. Plan will be sputum collection for culture, AFB and fungal studies. Additionally we will provide her with flutter valve and IS and instruct her on use.   Triage, please schedule patient for Wednesday 2/19 in the afternoon with me for follow up. I told her someone would give her a call with the times. Thanks so much.     Placed orders this morning for AFB and sputum collection Pt is in labs today with culture in a cup for collection Nothing further needed

## 2018-09-18 NOTE — Telephone Encounter (Signed)
New Message   1) Medication(s) Requested (by name): lisinopril-hydrochlorothiazide (PRINZIDE,ZESTORETIC) 10-12.5 MG tablet  2) Pharmacy of Choice: [facility] on [address]  3) Special Requests:   Approved medications will be sent to the pharmacy, we will reach out if there is an issue.  Requests made after 3pm may not be addressed until the following business day!  If a patient is unsure of the name of the medication(s) please note and ask patient to call back when they are able to provide all info, do not send to responsible party until all information is available!

## 2018-09-23 ENCOUNTER — Encounter: Payer: Self-pay | Admitting: Acute Care

## 2018-09-23 ENCOUNTER — Ambulatory Visit (INDEPENDENT_AMBULATORY_CARE_PROVIDER_SITE_OTHER): Payer: Medicare Other | Admitting: Acute Care

## 2018-09-23 DIAGNOSIS — Z720 Tobacco use: Secondary | ICD-10-CM

## 2018-09-23 DIAGNOSIS — F17210 Nicotine dependence, cigarettes, uncomplicated: Secondary | ICD-10-CM

## 2018-09-23 DIAGNOSIS — J449 Chronic obstructive pulmonary disease, unspecified: Secondary | ICD-10-CM | POA: Diagnosis not present

## 2018-09-23 MED ORDER — DOXYCYCLINE HYCLATE 100 MG PO TABS: 100.0000 mg | ORAL_TABLET | Freq: Two times a day (BID) | ORAL | 0 refills | Status: DC

## 2018-09-23 NOTE — Assessment & Plan Note (Addendum)
Continue working on smoking cessation Currently smoking 5 cigarettes per week She is on Wellbutrin to help her quit. Plan I have spent 3 minutes counseling patient on smoking cessation this visit. Patient verbalizes understanding of their  choice to continue smoking and the negative health consequences including worsening of COPD, risk of lung cancer , stroke and heart disease.Marland Kitchen

## 2018-09-23 NOTE — Patient Instructions (Addendum)
It is nice to see you  We will treat you with Doxycycline 100 mg twice daily x 7 days. Avoid sunlight  Take with a full glass of water  Continue Mucinex 1200 mg once daily with a full glass of water. I will call you with the final results of your sputum culture. Consider taking Pepcid 20 mg ( famotidine) at bedtime I will give you a copy of the reflux diet. We will plan on re-imaging 12/2018 as follow up on your abnormal CT to re-evaluate. Please contact office for sooner follow up if symptoms do not improve or worsen or seek emergency care  Keep working on quitting smoking with the Wellbutrin. Follow up prn

## 2018-09-23 NOTE — Assessment & Plan Note (Signed)
Abnormal Imaging per lung cancer screening  Lung RADS 4 A : suspicious findings, either short term follow up in 3 months or alternatively  PET Scan evaluation may be considered when there is a solid component of  8 mm or larger. Radiology feels more likely;y this is atypical mycobacterial infection Plan: We will treat you with Doxycycline 100 mg twice daily x 7 days. Avoid sunlight  Take with a full glass of water  Continue Mucinex 1200 mg once daily with a full glass of water. I will call you with the final results of your sputum culture. Consider taking Pepcid 20 mg ( famotidine) at bedtime I will give you a copy of the reflux diet. We will plan on re-imaging 12/2018 as follow up on your abnormal CT to re-evaluate for resolution vs worsening.. Please contact office for sooner follow up if symptoms do not improve or worsen or seek emergency care  Keep working on quitting smoking with the Wellbutrin. Follow up prn

## 2018-09-23 NOTE — Progress Notes (Signed)
History of Present Illness Raven Torres is a 63 y.o. female current every day smoker with an abnormal Low Dose CT, read as a Lung RADS 4 A : suspicious findings, either short term follow up in 3 months . She is followed by the screening program.  09/23/2018  Follow up visit for abnormal low dose CT. She brought in sputum for culture . The final results are still pending, but gram stains are positive for  Moderate White blood cells with  Rare epithelial cells : Moderate Gram positive cocci in pairs. Abnormal   She is on medications for her rheumatoid arthritis, Plaquenil and Azulfidine. She does endorse some recent weight loss. She states she has had a much smaller appetite recently. She states she is coughing up green secretions at intervals. She did start using Mucinex to help thin her secretions.She states this has helped. She denies any fever. She denies any chest pain, orthopnea or hemoptysis. She does endorse some nausea .  Test Results: Low Dose CT Chest 09/16/2018 IMPRESSION: Lung-RADS 4A, suspicious.  Multiple new right lung nodules, including a dominant 8.7 mm irregular nodule in the posterior right upper lobe and a 5.6 mm cavitary nodule in the right lower lobe. Waxing/waning nodules were present on recent priors, favoring a benign etiology such as atypical mycobacterial infection. Given this, it is unclear that this is an appropriate patient for lung cancer screening.  Follow-up CT chest is suggested in 3 months (after appropriate antimicrobial therapy).  CBC Latest Ref Rng & Units 11/19/2016 02/26/2016 08/23/2014  WBC 3.4 - 10.8 x10E3/uL 8.1 8.0 8.7  Hemoglobin 11.1 - 15.9 g/dL 17.5(H) 15.8(H) 17.2(H)  Hematocrit 34.0 - 46.6 % 51.1(H) 46.7(H) 49.1(H)  Platelets 150 - 379 x10E3/uL 174 155 171    BMP Latest Ref Rng & Units 11/19/2016 02/26/2016 07/19/2015  Glucose 65 - 99 mg/dL 103(H) 103(H) 88  BUN 8 - 27 mg/dL 7(L) 8 8  Creatinine 0.57 - 1.00 mg/dL 0.81 0.70 0.70    BUN/Creat Ratio 12 - 28 9(L) - -  Sodium 134 - 144 mmol/L 138 137 138  Potassium 3.5 - 5.2 mmol/L 4.9 3.9 4.7  Chloride 96 - 106 mmol/L 98 107 103  CO2 18 - 29 mmol/L 26 24 27   Calcium 8.7 - 10.3 mg/dL 10.1 9.1 9.5    BNP No results found for: BNP  ProBNP No results found for: PROBNP  PFT    Component Value Date/Time   FEV1PRE 0.80 03/25/2018 0952   FEV1POST 1.00 03/25/2018 0952   FVCPRE 1.84 03/25/2018 0952   FVCPOST 2.17 03/25/2018 0952   TLC 7.26 03/25/2018 0952   DLCOUNC 12.03 03/25/2018 0952   PREFEV1FVCRT 44 03/25/2018 0952   PSTFEV1FVCRT 46 03/25/2018 0952    Ct Chest Lung Ca Screen Low Dose W/o Cm  Result Date: 09/16/2018 CLINICAL DATA:  62 year old female current smoker, with 50 pack-year history of smoking, for initial lung cancer screening EXAM: CT CHEST WITHOUT CONTRAST LOW-DOSE FOR LUNG CANCER SCREENING TECHNIQUE: Multidetector CT imaging of the chest was performed following the standard protocol without IV contrast. COMPARISON:  CT chest dated 02/18/2018 FINDINGS: Cardiovascular: The heart is normal in size. No pericardial effusion. No evidence of thoracic aortic aneurysm. Atherosclerotic calcifications of the aortic arch. Three vessel coronary atherosclerosis. Mediastinum/Nodes: Small mediastinal lymph nodes which do not meet pathologic CT size criteria. Visualized thyroid is unremarkable. Lungs/Pleura: Biapical pleural-parenchymal scarring. Moderate centrilobular and paraseptal emphysematous changes, upper lobe predominant. No focal consolidation. Numerous bilateral pulmonary nodules, many of  which are subpleural and likely benign. 8.7 mm irregular nodule in the posterior right upper lobe (image 119) is new from the prior. A 6.9 mm irregular nodule in the anterior right upper lobe (image 105) is also new from the prior. A 5.6 mm cavitary nodule in the posterior right lower lobe (image 233) is likely new or at least increased. Differential considerations include atypical  mycobacterial infection (favored) or less likely metastases. Waxing/waning on prior studies favors atypical mycobacterial infection as the underlying etiology. In this context, it is unclear that this is an appropriate patient for lung cancer screening. No pleural effusion or pneumothorax. Upper Abdomen: Visualized upper abdomen is notable for cirrhosis, vascular calcifications, and a 3 mm nonobstructing right upper pole renal calculus. Musculoskeletal: Visualized osseous structures are within normal limits. IMPRESSION: Lung-RADS 4A, suspicious. Multiple new right lung nodules, including a dominant 8.7 mm irregular nodule in the posterior right upper lobe and a 5.6 mm cavitary nodule in the right lower lobe. Waxing/waning nodules were present on recent priors, favoring a benign etiology such as atypical mycobacterial infection. Given this, it is unclear that this is an appropriate patient for lung cancer screening. Follow-up CT chest is suggested in 3 months (after appropriate antimicrobial therapy). These results will be called to the ordering clinician or representative by the Radiologist Assistant, and communication documented in the PACS or zVision Dashboard. Aortic Atherosclerosis (ICD10-I70.0) and Emphysema (ICD10-J43.9). Electronically Signed   By: Julian Hy M.D.   On: 09/16/2018 11:39     Past medical hx Past Medical History:  Diagnosis Date  . Arthritis   . Colitis   . Hepatitis C   . TB (tuberculosis)      Social History   Tobacco Use  . Smoking status: Current Every Day Smoker    Packs/day: 1.00    Years: 49.00    Pack years: 49.00    Types: Cigarettes  . Smokeless tobacco: Never Used  . Tobacco comment: smoking 5 cig a week now as of Feb 2020  Substance Use Topics  . Alcohol use: No    Alcohol/week: 0.0 standard drinks  . Drug use: No    Raven Torres reports that she has been smoking cigarettes. She has a 49.00 pack-year smoking history. She has never used smokeless  tobacco. She reports that she does not drink alcohol or use drugs.  Tobacco Cessation: Current every day smoker. Smoking 5 cigarettes daily She is on Wellbutrin to help  Past surgical hx, Family hx, Social hx all reviewed.  Current Outpatient Medications on File Prior to Visit  Medication Sig  . Aclidinium Bromide (TUDORZA PRESSAIR) 400 MCG/ACT AEPB Inhale 1 puff into the lungs daily.  Marland Kitchen albuterol (PROAIR HFA) 108 (90 Base) MCG/ACT inhaler INHALE 2 PUFFS INTO THE LUNGS EVERY 6 HOURS AS NEEDED FOR WHEEZING OR SHORTNESS OF BREATH  . aspirin EC 81 MG tablet Take 1 tablet (81 mg total) by mouth daily.  Marland Kitchen atorvastatin (LIPITOR) 40 MG tablet TAKE 1 TABLET(40 MG) BY MOUTH DAILY AT 6 PM  . buPROPion (WELLBUTRIN) 100 MG tablet Take 1 tablet (100 mg total) by mouth 2 (two) times daily.  Marland Kitchen buPROPion (WELLBUTRIN) 100 MG tablet Take by mouth.  Marland Kitchen CALCIUM CARBONATE-VIT D-MIN PO Take 1,200 mg by mouth.  . Cholecalciferol (VITAMIN D3) 1000 units CAPS Take by mouth.  . diclofenac sodium (VOLTAREN) 1 % GEL Apply 2 g topically 4 (four) times daily.  . folic acid (FOLVITE) 1 MG tablet TK 1 T PO  D  .  gabapentin (NEURONTIN) 300 MG capsule Take 1 capsule (300 mg total) by mouth daily.  . hydroxychloroquine (PLAQUENIL) 200 MG tablet Take 200 mg by mouth 2 (two) times daily.  Marland Kitchen lisinopril-hydrochlorothiazide (PRINZIDE,ZESTORETIC) 10-12.5 MG tablet Take 1 tablet by mouth daily.  Marland Kitchen sulfaSALAzine (AZULFIDINE) 500 MG EC tablet Take by mouth.  . Tiotropium Bromide-Olodaterol (STIOLTO RESPIMAT) 2.5-2.5 MCG/ACT AERS Inhale 2 puffs into the lungs daily.  . Aclidinium Bromide (TUDORZA PRESSAIR) 400 MCG/ACT AEPB Inhale 1 puff into the lungs 2 (two) times daily for 1 day.   No current facility-administered medications on file prior to visit.      Allergies  Allergen Reactions  . Tudorza Pressair [Aclidinium Bromide] Itching  . Amitriptyline Other (See Comments)    Eye spasms per pt  . Codeine Nausea Only  .  Tramadol Nausea Only    Review Of Systems:  Constitutional:   No  weight loss, night sweats,  Fevers, chills, fatigue, or  lassitude.  HEENT:   No headaches,  Difficulty swallowing,  Tooth/dental problems, or  Sore throat,                No sneezing, itching, ear ache, nasal congestion, post nasal drip,   CV:  No chest pain,  Orthopnea, PND, swelling in lower extremities, anasarca, dizziness, palpitations, syncope.   GI  No heartburn, indigestion, abdominal pain, nausea, vomiting, diarrhea, change in bowel habits, loss of appetite, bloody stools.   Resp: No shortness of breath with exertion or at rest.  + excess mucus, + productive cough,  No non-productive cough,  No coughing up of blood.  + change in color of mucus.  No wheezing.  No chest wall deformity  Skin: no rash or lesions.  GU: no dysuria, change in color of urine, no urgency or frequency.  No flank pain, no hematuria   MS:  No joint pain or swelling.  No decreased range of motion.  No back pain.  Psych:  No change in mood or affect. No depression or anxiety.  No memory loss.   Vital Signs BP (!) 110/54 (BP Location: Left Arm, Cuff Size: Normal)   Pulse 78   Ht 5\' 6"  (1.676 m)   Wt 160 lb 6.4 oz (72.8 kg)   SpO2 95%   BMI 25.89 kg/m    Physical Exam:  General- No distress,  A&Ox3, pleasant ENT: No sinus tenderness, TM clear, pale nasal mucosa, no oral exudate,no post nasal drip, no LAN Cardiac: S1, S2, regular rate and rhythm, no murmur Chest: No wheeze/ rales/ dullness; coarse rhonchi throughout, no accessory muscle use, no nasal flaring, no sternal retractions Abd.: Soft Non-tender, ND, BS + Ext: No clubbing cyanosis, edema Neuro:  normal strength, MAE x 4, A&O x 3 Skin: No rashes, warm and dry Psych: normal mood and behavior   Assessment/Plan  GOLD COPD III B Abnormal Imaging per lung cancer screening  Lung RADS 4 A : suspicious findings, either short term follow up in 3 months or alternatively  PET  Scan evaluation may be considered when there is a solid component of  8 mm or larger. Radiology feels more likely;y this is atypical mycobacterial infection Plan: We will treat you with Doxycycline 100 mg twice daily x 7 days. Avoid sunlight  Take with a full glass of water  Continue Mucinex 1200 mg once daily with a full glass of water. I will call you with the final results of your sputum culture. Consider taking Pepcid 20 mg ( famotidine) at  bedtime I will give you a copy of the reflux diet. We will plan on re-imaging 12/2018 as follow up on your abnormal CT to re-evaluate for resolution vs worsening.. Please contact office for sooner follow up if symptoms do not improve or worsen or seek emergency care  Keep working on quitting smoking with the Wellbutrin. Follow up prn    Tobacco abuse Continue working on smoking cessation Currently smoking 5 cigarettes per week She is on Wellbutrin to help her quit. Plan I have spent 3 minutes counseling patient on smoking cessation this visit. Patient verbalizes understanding of their  choice to continue smoking and the negative health consequences including worsening of COPD, risk of lung cancer , stroke and heart disease.Magdalen Spatz, NP 09/23/2018  4:27 PM

## 2018-10-12 ENCOUNTER — Telehealth: Payer: Self-pay

## 2018-10-12 DIAGNOSIS — Z006 Encounter for examination for normal comparison and control in clinical research program: Secondary | ICD-10-CM

## 2018-10-12 DIAGNOSIS — R911 Solitary pulmonary nodule: Secondary | ICD-10-CM

## 2018-10-12 NOTE — Telephone Encounter (Signed)
Exact Nodule Study  Placed a call to Raven Torres to schedule her 12 month follow up blood sample collection for the Nodule study. The subject stated she was in town today and would like to come in today [id number] to have the sample collected.  Cam

## 2018-10-13 NOTE — Research (Signed)
LATE ENTRY FOR [id number]  .[email or web address]  Title: Blood Sample Collection in Subjects with Pulmonary Nodules or CT Suspicion of Lung  Cancer  Study Number: 2016-01; Protocol: Version 5.0 Amendment 4,  Date: September 03, 2016  Sponsor: [employer] [address] [place] [address]  Principal Investigator: Dr. Marshell Garfinkel ; Sub Investigators: Dr. Brand Males, Dr. Simonne Maffucci  Synopsis: This is multi-site, sample collection study.The study is to obtain de-identified, clinically characterized, whole blood specimens for use in assessing new biomarkers for the detection of neoplasms off the lung.  The study will sample blood (68mL) from approximately 2250 subject; 1000 will have CT suspicion of lung cancer which is ultimately diagnosed as lung cancer and approximately 1000 subjects will have pulmonary nodules greater than or equal to 4 mm .                 Clinical Research Coordinator / Research RN note : This visit for Subject Raven Torres with DOB: 24-Dec-1955 on 10/12/2018 for the above protocol is Visit/Encounter # 12 month blood collection  and is for purpose of Research . The consent for this encounter is  currently IRB approved. Subject expressed continued interest and consent in continuing as a study subject. Subject confirmed that there was a change in the subjects address and has been updated in the study records.  Subject was thanked for participation in research and contribution to science.   In this visit 10/12/2018 the subject came into clinic to have the 12 month blood sample collection. The subject provided the sample as per required per the above reference protocol.  Additional details  1. Because this visit is a key visit of Subject reconsent, this visit is under direct supervision of the PI Dr. Marshell Garfinkel . This PI was available for this visit. The PI was present for the consenting process and all questions were answered to the subjects  satisfaction.  Signed by  T. Early Chars  BS, [place], [place], [place] 8:46 AM 10/13/2018  Late entry for [id number]

## 2018-10-20 ENCOUNTER — Encounter: Payer: Self-pay | Admitting: Internal Medicine

## 2018-11-05 LAB — MYCOBACTERIA,CULT W/FLUOROCHROME SMEAR
MICRO NUMBER:: 197472
SMEAR:: NONE SEEN
SPECIMEN QUALITY:: ADEQUATE

## 2018-11-05 LAB — RESPIRATORY CULTURE OR RESPIRATORY AND SPUTUM CULTURE
MICRO NUMBER:: 197473
RESULT:: NORMAL
SPECIMEN QUALITY:: ADEQUATE

## 2018-11-06 ENCOUNTER — Other Ambulatory Visit: Payer: Self-pay | Admitting: Internal Medicine

## 2018-11-10 ENCOUNTER — Telehealth: Payer: Self-pay | Admitting: Acute Care

## 2018-11-10 NOTE — Telephone Encounter (Signed)
LMTCB. Please inform patient she will need to call her insurance and find out what inhalers are on her formulary.

## 2018-11-11 NOTE — Telephone Encounter (Signed)
Attempted to call [facility] x2 to see if alternatives are suggested for Stiolto if not covered by patient insurance.  Unable to speak with pharmacy as all lines busy.  Attempted to leave callback number and message but voicemail cut off at pharmacy during the attempt to leave a message.   Left message on patient's voicemail to call back if she has insurance alternatives for stiolto.

## 2018-11-12 NOTE — Telephone Encounter (Signed)
Attempted to call pt but unable to reach. Left message for pt to return call. 

## 2018-11-16 NOTE — Telephone Encounter (Signed)
[email or web address]

## 2018-11-17 NOTE — Telephone Encounter (Addendum)
Attempted to call pt but unable to reach. Left message for pt to return call. 

## 2018-11-18 NOTE — Telephone Encounter (Signed)
Closing encounter per protocol 

## 2018-12-04 ENCOUNTER — Other Ambulatory Visit: Payer: Self-pay | Admitting: Internal Medicine

## 2018-12-07 ENCOUNTER — Other Ambulatory Visit: Payer: Self-pay | Admitting: Internal Medicine

## 2018-12-07 DIAGNOSIS — F17200 Nicotine dependence, unspecified, uncomplicated: Secondary | ICD-10-CM

## 2018-12-08 ENCOUNTER — Encounter: Payer: Self-pay | Admitting: Acute Care

## 2018-12-15 ENCOUNTER — Telehealth: Payer: Self-pay | Admitting: Acute Care

## 2018-12-15 MED ORDER — GLYCOPYRROLATE-FORMOTEROL 9-4.8 MCG/ACT IN AERO: 2.0000 | INHALATION_SPRAY | Freq: Two times a day (BID) | RESPIRATORY_TRACT | 4 refills | Status: DC

## 2018-12-15 NOTE — Telephone Encounter (Signed)
Rx for Raven Torres has been sent to pt's preferred pharmacy and I also discontinued Stiolto off of pt's med list since it was not covered by insurance.  Called and spoke with pt letting her know this was done and stated to pt after she picked up the Rx to stop by office so someone could instruct her how to use the inhaler and pt verbalized undertstanding. Also stated to pt that we needed to schedule an appt for her in about 6-8 weeks to reassess how things have been doing after beginning the new inhaler and pt verbalized understanding. appt scheduled for pt with  SG 7/1 at 2pm. Nothing further needed.

## 2018-12-15 NOTE — Telephone Encounter (Signed)
Called and spoke with pt in regards to stiolto not being covered by insurance but she stated that Medicare will pay for either Anoro or Bevespi.  Pt is requesting to have either one of those inhalers that is covered by Medicare to be called into pharmacy for her. Aaron Edelman, since Judson Roch is not at office today please advise on this for pt. Thanks!

## 2018-12-15 NOTE — Telephone Encounter (Signed)
Ok to stop stiolto. Start Bevespi.   Bevespi Aerosphere inhaler >>>2 puffs daily twice a day (4 puffs total daily) >>>This is not a rescue inhaler >>>You take this daily no matter what  Please place the order.  Can provide the patient with 4 refills.  Please inform patient.  I would recommend the patient contact our office to set up a time to stop by to have a nurse show her how to use the Bevespi inhaler.  If patient has any problems with using this medication she needs to contact our office.  Please make sure the patient is scheduled for a 6 to 8-week follow-up in office to assess how she is doing since being switched to [facility].  Wyn Quaker, FNP

## 2018-12-25 DIAGNOSIS — Z79899 Other long term (current) drug therapy: Secondary | ICD-10-CM | POA: Diagnosis not present

## 2018-12-25 DIAGNOSIS — M0579 Rheumatoid arthritis with rheumatoid factor of multiple sites without organ or systems involvement: Secondary | ICD-10-CM | POA: Diagnosis not present

## 2019-01-05 ENCOUNTER — Other Ambulatory Visit: Payer: Self-pay | Admitting: Pharmacist

## 2019-01-05 MED ORDER — LISINOPRIL 10 MG PO TABS: 10.0000 mg | ORAL_TABLET | Freq: Every day | ORAL | 2 refills | Status: DC

## 2019-01-05 MED ORDER — HYDROCHLOROTHIAZIDE 12.5 MG PO CAPS: 12.5000 mg | ORAL_CAPSULE | Freq: Every day | ORAL | 2 refills | Status: DC

## 2019-01-05 NOTE — Progress Notes (Signed)
Received call from pharmacy. Lisinopril-HCTZ combination pill on backorder. Will order as separate components.

## 2019-01-18 ENCOUNTER — Telehealth: Payer: Self-pay | Admitting: *Deleted

## 2019-01-18 NOTE — Telephone Encounter (Signed)

## 2019-01-19 ENCOUNTER — Other Ambulatory Visit: Payer: Self-pay

## 2019-01-19 ENCOUNTER — Ambulatory Visit (INDEPENDENT_AMBULATORY_CARE_PROVIDER_SITE_OTHER)
Admission: RE | Admit: 2019-01-19 | Discharge: 2019-01-19 | Disposition: A | Discharge status: Home / Self Care | Payer: Medicare Other | Source: Ambulatory Visit | Attending: Acute Care | Admitting: Acute Care

## 2019-01-19 DIAGNOSIS — Z122 Encounter for screening for malignant neoplasm of respiratory organs: Secondary | ICD-10-CM

## 2019-01-19 DIAGNOSIS — J439 Emphysema, unspecified: Secondary | ICD-10-CM | POA: Diagnosis not present

## 2019-01-19 DIAGNOSIS — J9809 Other diseases of bronchus, not elsewhere classified: Secondary | ICD-10-CM | POA: Diagnosis not present

## 2019-01-19 DIAGNOSIS — R911 Solitary pulmonary nodule: Secondary | ICD-10-CM | POA: Diagnosis not present

## 2019-01-19 DIAGNOSIS — Z87891 Personal history of nicotine dependence: Secondary | ICD-10-CM

## 2019-01-19 DIAGNOSIS — R918 Other nonspecific abnormal finding of lung field: Secondary | ICD-10-CM | POA: Diagnosis not present

## 2019-01-19 DIAGNOSIS — F17210 Nicotine dependence, cigarettes, uncomplicated: Secondary | ICD-10-CM

## 2019-01-20 ENCOUNTER — Other Ambulatory Visit: Payer: Self-pay | Admitting: Acute Care

## 2019-01-20 DIAGNOSIS — Z87891 Personal history of nicotine dependence: Secondary | ICD-10-CM

## 2019-01-20 DIAGNOSIS — F17210 Nicotine dependence, cigarettes, uncomplicated: Secondary | ICD-10-CM

## 2019-01-20 DIAGNOSIS — Z122 Encounter for screening for malignant neoplasm of respiratory organs: Secondary | ICD-10-CM

## 2019-01-24 ENCOUNTER — Other Ambulatory Visit: Payer: Self-pay | Admitting: Internal Medicine

## 2019-02-03 ENCOUNTER — Other Ambulatory Visit: Payer: Self-pay

## 2019-02-03 ENCOUNTER — Encounter: Payer: Self-pay | Admitting: Acute Care

## 2019-02-03 ENCOUNTER — Ambulatory Visit (INDEPENDENT_AMBULATORY_CARE_PROVIDER_SITE_OTHER): Payer: Medicare Other | Admitting: Acute Care

## 2019-02-03 DIAGNOSIS — J449 Chronic obstructive pulmonary disease, unspecified: Secondary | ICD-10-CM

## 2019-02-03 DIAGNOSIS — Z720 Tobacco use: Secondary | ICD-10-CM

## 2019-02-03 DIAGNOSIS — F17210 Nicotine dependence, cigarettes, uncomplicated: Secondary | ICD-10-CM | POA: Diagnosis not present

## 2019-02-03 DIAGNOSIS — R911 Solitary pulmonary nodule: Secondary | ICD-10-CM | POA: Diagnosis not present

## 2019-02-03 MED ORDER — ALBUTEROL SULFATE HFA 108 (90 BASE) MCG/ACT IN AERS: INHALATION_SPRAY | RESPIRATORY_TRACT | 2 refills | Status: DC

## 2019-02-03 NOTE — Patient Instructions (Addendum)
It is nice to talk with you today. [name] glad you are doing well.  We will re-order your albuterol inhaler . Use as needed for breakthrough shortness of breath or wheezing up to 3 times a day. If you need it more than 3 times daily please call the office for an appointment . Continue your Bevespi 2 puffs twice daily  This is your maintenance medication. Use every day without fail. We will schedule you for a 6 month follow up with Dr. Vaughan Browner You need a follow up Low Dose Screening CT 12/2019. You will get a call to get this scheduled close to the date. Please work on quitting smoking.  This is the single most powerful action you can take to decrease your risk of lung cancer,worseing  pulmonary disease, heart disease and stroke. Note your daily symptoms > remember "red flags" for COPD:  Increase in cough, increase in sputum production, increase in shortness of breath or activity intolerance. If you notice these symptoms, please call to be seen.   Please contact office for sooner follow up if symptoms do not improve or worsen or seek emergency care

## 2019-02-03 NOTE — Progress Notes (Signed)
Virtual Visit via Telephone Note  I connected with Raven Torres on 02/03/19 at  2:00 PM EDT by telephone and verified that [name] speaking with the correct person using two identifiers.  I  confirmed date of birth and address to authenticate patient  Identity. My nurse Quentin Ore reviewed medications and ordered any refills required.  Location: Patient: At home Provider: In the office at [address] [place], [place] , [place] [address]   I discussed the limitations, risks, security and privacy concerns of performing an evaluation and management service by telephone and the availability of in person appointments. I also discussed with the patient that there may be a patient responsible charge related to this service. The patient expressed understanding and agreed to proceed.  Synopsis Raven Torres is a 63 y.o. female current every day smoker ( 49 pack year smoking history) with an abnormal Low Dose CT, read as a Lung RADS 4 A : suspicious findings, either short term follow up in 3 months . She was treated with Doxycycline, and follow up LDCT  12/2018 was read as a Lung RADS 2: nodules that are benign in appearance and behavior with a very low likelihood of becoming a clinically active cancer due to size or lack of growth. Recommendation per radiology is for a repeat LDCT in 12 months.She is followed by the screening program.    History of Present Illness: Pt. Presents for follow up. She states she has been compliant with her Bevespi. She states she needs her albuterol inhaler renewed. She is very relieved about her low dose CT scan. We discussed that she will have a repeat scan 12/2019. She continues to smoke. She states she is at her baseline status. She does have more issues with dyspnea when she is outside in the heat. She uses her rescue inhaler about twice a week. She denies fever, chest pain, orthopnea or hemoptysis. We discussed the need to quit smoking.    Observations/Objective:  Low Dose CT for Lung Cancer Screening Lung-RADS 2, benign appearance or behavior. Continue annual screening with low-dose chest CT without contrast in 12 months. 2. Three-vessel coronary atherosclerosis. 3. Morphologic changes of hepatic cirrhosis  Low Dose CT for Lung Cancer Screening 09/2018 Lung-RADS 4A, suspicious. Multiple new right lung nodules, including a dominant 8.7 mm irregular nodule in the posterior right upper lobe and a 5.6 mm cavitary nodule in the right lower lobe. Waxing/waning nodules were present on recent priors, favoring a benign etiology such as atypical mycobacterial infection.   Follow-up CT chest is suggested in 3 months (after appropriate antimicrobial therapy).  CT scan 04/18/17-multiple subcentimeter pulmonary nodule. The largest one is a left lower lobe, 1 cm in size. >>>Diffuse emphysematous changes. Aortic, coronary atherosclerosis.  CT 11/18/2017- dominant left lower lobe nodule has resolved.  Persistence of multiple subcentimeter pulmonary nodules with some new nodules   CT 02/18/2018- subcentimeter pulmonary nodules in the right upper and middle lobe have improved while there was an increase in size and number in the lower lobes.  Moderate to severe emphysema.  03/25/2018-pulmonary function test- FVC 1.84 (55% predicted), postbronchodilator ratio 46, FEV1 39, profound bronchodilator response, DLCO 46   Assessment and Plan Pt is at her baseline status with her COPD She is doing well with the change to [facility] We will re-order your albuterol inhaler . Use as needed for breakthrough shortness of breath or wheezing up to 3 times a day. If you need it more than 3 times  daily please call the office for an appointment . Continue your Bevespi 2 puffs twice daily  This is your maintenance medication. Use every day without fail. We will schedule you for a 6 month follow up with Dr. Vaughan Browner You need a follow up Low Dose  Screening CT 12/2019. You will get a call to get this scheduled close to the date. Please work on quitting smoking.  This is the single most powerful action you can take to decrease your risk of lung cancer,worseing  pulmonary disease, heart disease and stroke. Note your daily symptoms > remember "red flags" for COPD:  Increase in cough, increase in sputum production, increase in shortness of breath or activity intolerance. If you notice these symptoms, please call to be seen.   Please contact office for sooner follow up if symptoms do not improve or worsen or seek emergency care   Follow Up Instructions: Follow up with Dr. Vaughan Browner in 6 months.   I discussed the assessment and treatment plan with the patient. The patient was provided an opportunity to ask questions and all were answered. The patient agreed with the plan and demonstrated an understanding of the instructions.   The patient was advised to call back or seek an in-person evaluation if the symptoms worsen or if the condition fails to improve as anticipated.  I provided 22 minutes of non-face-to-face time during this encounter.   Magdalen Spatz, NP 02/03/2019 2:33 PM

## 2019-03-05 ENCOUNTER — Other Ambulatory Visit: Payer: Self-pay | Admitting: Internal Medicine

## 2019-03-05 DIAGNOSIS — F17200 Nicotine dependence, unspecified, uncomplicated: Secondary | ICD-10-CM

## 2019-03-09 ENCOUNTER — Other Ambulatory Visit: Payer: Self-pay | Admitting: Pharmacist

## 2019-03-09 DIAGNOSIS — I10 Essential (primary) hypertension: Secondary | ICD-10-CM

## 2019-03-09 MED ORDER — LISINOPRIL 10 MG PO TABS: 10.0000 mg | ORAL_TABLET | Freq: Every day | ORAL | 0 refills | Status: DC

## 2019-03-09 MED ORDER — HYDROCHLOROTHIAZIDE 12.5 MG PO CAPS: 12.5000 mg | ORAL_CAPSULE | Freq: Every day | ORAL | 0 refills | Status: DC

## 2019-03-12 ENCOUNTER — Telehealth: Payer: Self-pay | Admitting: Pulmonary Disease

## 2019-03-12 NOTE — Telephone Encounter (Signed)
Medication name and strength: Bevespi Provider: Dr. Vaughan Browner Pharmacy: [facility]  Was the PA started on [facility]?  Yes If yes, please enter the Key: [id number] Timeframe for approval/denial: 1-3 business days

## 2019-03-15 NOTE — Telephone Encounter (Signed)
Per CMM, PA has been approved until 03/14/2020.   Attempted to call pharmacy but was placed on a hold. Left a message for pharmacy to go ahead and run RX for patient. Will close this encounter.

## 2019-03-23 ENCOUNTER — Other Ambulatory Visit: Payer: Self-pay | Admitting: Internal Medicine

## 2019-03-23 ENCOUNTER — Other Ambulatory Visit: Payer: Self-pay | Admitting: Acute Care

## 2019-04-02 ENCOUNTER — Other Ambulatory Visit: Payer: Self-pay | Admitting: Internal Medicine

## 2019-04-02 DIAGNOSIS — F17200 Nicotine dependence, unspecified, uncomplicated: Secondary | ICD-10-CM

## 2019-04-14 ENCOUNTER — Other Ambulatory Visit: Payer: Self-pay | Admitting: Internal Medicine

## 2019-04-19 ENCOUNTER — Other Ambulatory Visit: Payer: Self-pay | Admitting: Internal Medicine

## 2019-04-19 DIAGNOSIS — I10 Essential (primary) hypertension: Secondary | ICD-10-CM

## 2019-04-20 ENCOUNTER — Ambulatory Visit: Payer: Medicare Other | Admitting: Pulmonary Disease

## 2019-05-07 ENCOUNTER — Ambulatory Visit (HOSPITAL_BASED_OUTPATIENT_CLINIC_OR_DEPARTMENT_OTHER): Payer: Medicare Other | Admitting: Pharmacist

## 2019-05-07 ENCOUNTER — Ambulatory Visit: Payer: Medicare Other | Attending: Internal Medicine | Admitting: Internal Medicine

## 2019-05-07 ENCOUNTER — Other Ambulatory Visit: Payer: Self-pay

## 2019-05-07 ENCOUNTER — Encounter: Payer: Self-pay | Admitting: Internal Medicine

## 2019-05-07 VITALS — BP 125/63 | HR 77 | Temp 98.0°F | Resp 18 | Ht 65.5 in | Wt 170.0 lb

## 2019-05-07 DIAGNOSIS — I2510 Atherosclerotic heart disease of native coronary artery without angina pectoris: Secondary | ICD-10-CM | POA: Insufficient documentation

## 2019-05-07 DIAGNOSIS — R918 Other nonspecific abnormal finding of lung field: Secondary | ICD-10-CM

## 2019-05-07 DIAGNOSIS — L570 Actinic keratosis: Secondary | ICD-10-CM

## 2019-05-07 DIAGNOSIS — I708 Atherosclerosis of other arteries: Secondary | ICD-10-CM | POA: Diagnosis not present

## 2019-05-07 DIAGNOSIS — M0579 Rheumatoid arthritis with rheumatoid factor of multiple sites without organ or systems involvement: Secondary | ICD-10-CM

## 2019-05-07 DIAGNOSIS — I10 Essential (primary) hypertension: Secondary | ICD-10-CM | POA: Diagnosis not present

## 2019-05-07 DIAGNOSIS — R413 Other amnesia: Secondary | ICD-10-CM

## 2019-05-07 DIAGNOSIS — E7800 Pure hypercholesterolemia, unspecified: Secondary | ICD-10-CM | POA: Insufficient documentation

## 2019-05-07 DIAGNOSIS — Z79899 Other long term (current) drug therapy: Secondary | ICD-10-CM | POA: Diagnosis not present

## 2019-05-07 DIAGNOSIS — K7460 Unspecified cirrhosis of liver: Secondary | ICD-10-CM | POA: Insufficient documentation

## 2019-05-07 DIAGNOSIS — F17210 Nicotine dependence, cigarettes, uncomplicated: Secondary | ICD-10-CM | POA: Insufficient documentation

## 2019-05-07 DIAGNOSIS — J449 Chronic obstructive pulmonary disease, unspecified: Secondary | ICD-10-CM

## 2019-05-07 DIAGNOSIS — Z7982 Long term (current) use of aspirin: Secondary | ICD-10-CM | POA: Diagnosis not present

## 2019-05-07 DIAGNOSIS — F17200 Nicotine dependence, unspecified, uncomplicated: Secondary | ICD-10-CM | POA: Diagnosis not present

## 2019-05-07 DIAGNOSIS — Z23 Encounter for immunization: Secondary | ICD-10-CM

## 2019-05-07 DIAGNOSIS — I7090 Unspecified atherosclerosis: Secondary | ICD-10-CM

## 2019-05-07 DIAGNOSIS — R21 Rash and other nonspecific skin eruption: Secondary | ICD-10-CM | POA: Insufficient documentation

## 2019-05-07 DIAGNOSIS — B182 Chronic viral hepatitis C: Secondary | ICD-10-CM | POA: Diagnosis not present

## 2019-05-07 DIAGNOSIS — F419 Anxiety disorder, unspecified: Secondary | ICD-10-CM | POA: Diagnosis not present

## 2019-05-07 DIAGNOSIS — Z1211 Encounter for screening for malignant neoplasm of colon: Secondary | ICD-10-CM | POA: Diagnosis not present

## 2019-05-07 DIAGNOSIS — M797 Fibromyalgia: Secondary | ICD-10-CM | POA: Diagnosis not present

## 2019-05-07 MED ORDER — ATORVASTATIN CALCIUM 40 MG PO TABS: 40.0000 mg | ORAL_TABLET | Freq: Every day | ORAL | 4 refills | Status: DC

## 2019-05-07 NOTE — Progress Notes (Signed)
Patient ID: Raven Torres, female    DOB: October 02, 1955  MRN: [id number]  CC: Follow-up   Subjective: Raven Torres is a 63 y.o. female who presents for chronic ds management Her concerns today include:  HTN, cured hepatitis C with cirrhosis, adjustment disorder, HL, tobacco dependence, and rheumatoid arthritis/fibromyalgia (Dr. Posey Pronto at [facility]) , memory decline, COPD GOLD III, lung nodules (Dr. Vaughan Browner), coronary atherosclerosis  Memory Decline:  Feels memory still bad but no worse.  Daughter mentions that she continues to repeat things. -referred to neurology on last visit.  She was called 3 times but never replied to schedule an appointment. Wants to hold off; afraid of too many doctors appointment at this time due to [place] pandemic.  Seeing the neurologist is "not on my priority list right now."  HTN:  Reports compliance with Lisinopril and HCTZ Limits salt in foods. No CP, HA +SOB which she attributes to CODP  COPD III/Tob dep/Lung nodules: using Bevespi BID and Proventil once a day "if I'm out in the heat." -no major recent flares No increase cough -down to 2-3 cig/day.  Working on quitting.  Bupropion was very helpful at [facility].  "Calms my anxiety of wanting to smoke."  Admits that she does not take it twice a day.  Takes it about 5 x a wk -saw pulmonary NP 02/22/2019.  Lung nodules stable.  Plan for repeat CT scan 5/21  HL:  Tolerating Lipitor but out x 1 wk.  Taking ASA  RA:  Still on PQ and SSA.  Last spoke with Dr. Posey Pronto 12/2018 -referred for eye exam on last visit with me .  pt states she had appt but they cancelled it.  She plans to reschedule  Was called for c-scope "but [name] too cared to do it yet."  Nervous about being compromised due to COVID pandemic  Complains of itchy rash on her upper back over the chest the arms and legs.  She states they appear as raised spots.  It is chronic.  Reports seeing dermatologist in the past.  States she was told it is just old age.  She is wanting to know whether she needs to have screening test done for ovarian cancer.  Had a hysterectomy years ago secondary to severe endometriosis. Patient Active Problem List   Diagnosis Date Noted  . GOLD COPD III B 04/23/2018  . Pruritic dermatitis 04/23/2018  . High cholesterol 06/24/2017  . Shortness of breath 06/04/2017  . Coronary artery calcification 06/04/2017  . Other fatigue 06/04/2017  . Centrilobular emphysema ([place]) 05/15/2017  . Atherosclerosis 05/15/2017  . Memory change 05/15/2017  . TB lung, latent 04/16/2017  . Lung nodules 04/16/2017  . Diabetes mellitus screening 11/19/2016  . Fibromyalgia 05/15/2015  . Rheumatoid arthritis ([place]) 08/22/2014  . HTN (hypertension) 08/22/2014  . Tobacco abuse 08/22/2014  . Hepatic cirrhosis ([place]) 05/31/2014  . Chronic hepatitis C virus infection ([place]) 05/03/2014     Current Outpatient Medications on File Prior to Visit  Medication Sig Dispense Refill  . albuterol (VENTOLIN HFA) 108 (90 Base) MCG/ACT inhaler INHALE 2 PUFFS INTO THE LUNGS EVERY 6 HOURS AS NEEDED FOR WHEEZING OR SHORTNESS OF BREATH 8.5 g 2  . aspirin EC 81 MG tablet Take 1 tablet (81 mg total) by mouth daily. 30 tablet 0  . buPROPion (WELLBUTRIN) 100 MG tablet Take 1 tablet (100 mg total) by mouth 2 (two) times daily. MUST MAKE APPT FOR FURTHER REFILLS 60 tablet 0  . CALCIUM CARBONATE-VIT D-MIN  PO Take 1,200 mg by mouth.    . Cholecalciferol (VITAMIN D3) 1000 units CAPS Take by mouth.    . diclofenac sodium (VOLTAREN) 1 % GEL Apply 2 g topically 4 (four) times daily. 1 Tube 2  . folic acid (FOLVITE) 1 MG tablet TK 1 T PO  D  3  . gabapentin (NEURONTIN) 300 MG capsule Take 1 capsule (300 mg total) by mouth daily. 90 capsule 3  . Glycopyrrolate-Formoterol (BEVESPI AEROSPHERE) 9-4.8 MCG/ACT AERO Inhale 2 puffs into the lungs 2 (two) times a day. 1 Inhaler 4  . hydrochlorothiazide (MICROZIDE) 12.5 MG capsule Take 1 capsule (12.5 mg total) by mouth daily. Must have  appt for more refills. 30 capsule 0  . hydroxychloroquine (PLAQUENIL) 200 MG tablet Take 200 mg by mouth 2 (two) times daily.    Marland Kitchen lisinopril (ZESTRIL) 10 MG tablet Take 1 tablet (10 mg total) by mouth daily. Must have appt for more refills. 30 tablet 0  . sulfaSALAzine (AZULFIDINE) 500 MG EC tablet Take by mouth.     No current facility-administered medications on file prior to visit.     Allergies  Allergen Reactions  . Tudorza Pressair [Aclidinium Bromide] Itching  . Amitriptyline Other (See Comments)    Eye spasms per pt  . Codeine Nausea Only  . Tramadol Nausea Only    Social History   Socioeconomic History  . Marital status: Married    Spouse name: Not on file  . Number of children: Not on file  . Years of education: Not on file  . Highest education level: Not on file  Occupational History  . Not on file  Social Needs  . Financial resource strain: Not on file  . Food insecurity    Worry: Not on file    Inability: Not on file  . Transportation needs    Medical: Not on file    Non-medical: Not on file  Tobacco Use  . Smoking status: Current Every Day Smoker    Packs/day: 1.00    Years: 49.00    Pack years: 49.00    Types: Cigarettes  . Smokeless tobacco: Never Used  . Tobacco comment: smoking 5 cig a week now as of Feb 2020  Substance and Sexual Activity  . Alcohol use: No    Alcohol/week: 0.0 standard drinks  . Drug use: No  . Sexual activity: Never  Lifestyle  . Physical activity    Days per week: Not on file    Minutes per session: Not on file  . Stress: Not on file  Relationships  . Social Herbalist on phone: Not on file    Gets together: Not on file    Attends religious service: Not on file    Active member of club or organization: Not on file    Attends meetings of clubs or organizations: Not on file    Relationship status: Not on file  . Intimate partner violence    Fear of current or ex partner: Not on file    Emotionally abused:  Not on file    Physically abused: Not on file    Forced sexual activity: Not on file  Other Topics Concern  . Not on file  Social History Narrative  . Not on file    Family History  Problem Relation Age of Onset  . Heart disease Mother   . Cancer Mother     Past Surgical History:  Procedure Laterality Date  . ABDOMINAL HYSTERECTOMY    .  KNEE SURGERY Right     ROS: Review of Systems Negative except as stated above  PHYSICAL EXAM: BP 125/63 (BP Location: Left Arm, Patient Position: Sitting, Cuff Size: Large)   Pulse 77   Temp 98 F (36.7 C) (Oral)   Resp 18   Ht 5' 5.5" (1.664 m)   Wt 170 lb (77.1 kg)   SpO2 96%   BMI 27.86 kg/m   Wt Readings from Last 3 Encounters:  05/07/19 170 lb (77.1 kg)  09/23/18 160 lb 6.4 oz (72.8 kg)  09/16/18 162 lb 6.4 oz (73.7 kg)    Physical Exam  General appearance - alert, well appearing, and in no distress Mental status - normal mood, behavior, speech, dress, motor activity, and thought processes Mouth - mucous membranes moist, pharynx normal without lesions Neck - supple, no significant adenopathy Chest - clear to auscultation, no wheezes, rales or rhonchi, symmetric air entry Heart - normal rate, regular rhythm, normal S1, S2, no murmurs, rubs, clicks or gallops Extremities - peripheral pulses normal, no pedal edema, no clubbing or cyanosis Skin: Very dry skin on the upper posterior thorax.  She has what appears to be actinic keratosis on the upper anterior chest, scattered on the arms and a few on the legs. CMP Latest Ref Rng & Units 11/19/2016 02/26/2016 07/19/2015  Glucose 65 - 99 mg/dL 103(H) 103(H) 88  BUN 8 - 27 mg/dL 7(L) 8 8  Creatinine 0.57 - 1.00 mg/dL 0.81 0.70 0.70  Sodium 134 - 144 mmol/L 138 137 138  Potassium 3.5 - 5.2 mmol/L 4.9 3.9 4.7  Chloride 96 - 106 mmol/L 98 107 103  CO2 18 - 29 mmol/L 26 24 27   Calcium 8.7 - 10.3 mg/dL 10.1 9.1 9.5  Total Protein 6.1 - 8.1 g/dL - 7.0 -  Total Bilirubin 0.2 - 1.2 mg/dL  - 0.6 -  Alkaline Phos 33 - 130 U/L - 52 -  AST 10 - 35 U/L - 27 -  ALT 6 - 29 U/L - 13 -   Lipid Panel     Component Value Date/Time   CHOL 136 10/06/2017 0908   TRIG 73 10/06/2017 0908   HDL 81 10/06/2017 0908   CHOLHDL 1.7 10/06/2017 0908   CHOLHDL 2.5 02/26/2016 1132   VLDL 28 02/26/2016 1132   LDLCALC 40 10/06/2017 0908    CBC    Component Value Date/Time   WBC 8.1 11/19/2016 1223   WBC 8.0 02/26/2016 1132   RBC 5.76 (H) 11/19/2016 1223   RBC 5.19 (H) 02/26/2016 1132   HGB 17.5 (H) 11/19/2016 1223   HCT 51.1 (H) 11/19/2016 1223   PLT 174 11/19/2016 1223   MCV 89 11/19/2016 1223   MCH 30.4 11/19/2016 1223   MCH 30.4 02/26/2016 1132   MCHC 34.2 11/19/2016 1223   MCHC 33.8 02/26/2016 1132   RDW 14.4 11/19/2016 1223   LYMPHSABS 2.9 11/19/2016 1223   MONOABS 720 02/26/2016 1132   EOSABS 0.2 11/19/2016 1223   BASOSABS 0.0 11/19/2016 1223   Mini-Mental status exam: Patient scored 30  ASSESSMENT AND PLAN: 1. Essential hypertension At goal.  Continue current medications that include lisinopril, hydrochlorothiazide  2. Poor memory Patient scored a perfect score on her Mini-Mental status exam today.  At least we have a baseline.  She declines referral to neurology at this time  3. Tobacco dependence Commended her on cutting back.  Encouraged her to set a quit date.  About 3-minute spent on counseling  4. GOLD COPD III  B Stable.  Followed by pulmonary  5. Lung nodules Stable.  Followed by pulmonary  6. Need for influenza vaccination Given.  7. Need for vaccination against Streptococcus pneumoniae using pneumococcal conjugate vaccine 13 Given.  8. Rheumatoid arthritis involving multiple sites with positive rheumatoid factor ([place]) On DMARDs.  Advised patient of the importance of getting annual eye exam while she is on Plaquenil.  I will resubmit the referral to ophthalmology - Ambulatory referral to Ophthalmology - CBC - Comprehensive metabolic panel  9.  Screening for colon cancer Patient wants to hold off on doing this until next year  87. Actinic keratosis - Ambulatory referral to Dermatology  11. Atherosclerosis of arteries - atorvastatin (LIPITOR) 40 MG tablet; Take 1 tablet (40 mg total) by mouth daily at 6 PM. NEED OV.  Dispense: 30 tablet; Refill: 4 - CBC - Comprehensive metabolic panel - Lipid panel   Patient was given the opportunity to ask questions.  Patient verbalized understanding of the plan and was able to repeat key elements of the plan.   Orders Placed This Encounter  Procedures  . CBC  . Comprehensive metabolic panel  . Lipid panel  . Ambulatory referral to Ophthalmology  . Ambulatory referral to Dermatology     Requested Prescriptions   Signed Prescriptions Disp Refills  . atorvastatin (LIPITOR) 40 MG tablet 30 tablet 4    Sig: Take 1 tablet (40 mg total) by mouth daily at 6 PM. NEED OV.    Return in about 4 months (around 09/07/2019).  Karle Plumber, MD, FACP

## 2019-05-07 NOTE — Progress Notes (Signed)
Patient presents for vaccination against S. Pneumoniae and influenza per orders of Dr. Wynetta Emery. Consent given. Counseling provided. No contraindications exists. Vaccine administered without incident.

## 2019-05-08 LAB — LIPID PANEL
Chol/HDL Ratio: 2 ratio (ref 0.0–4.4)
Cholesterol, Total: 166 mg/dL (ref 100–199)
HDL: 81 mg/dL (ref >39)
LDL Chol Calc (NIH): 71 mg/dL (ref 0–99)
Triglycerides: 74 mg/dL (ref 0–149)
VLDL Cholesterol Cal: 14 mg/dL (ref 5–40)

## 2019-05-08 LAB — CBC
Hematocrit: 42.6 % (ref 34.0–46.6)
Hemoglobin: 14.2 g/dL (ref 11.1–15.9)
MCH: 30.9 pg (ref 26.6–33.0)
MCHC: 33.3 g/dL (ref 31.5–35.7)
MCV: 93 fL (ref 79–97)
Platelets: 145 10*3/uL — ABNORMAL LOW (ref 150–450)
RBC: 4.6 x10E6/uL (ref 3.77–5.28)
RDW: 12.8 % (ref 11.7–15.4)
WBC: 6 10*3/uL (ref 3.4–10.8)

## 2019-05-08 LAB — COMPREHENSIVE METABOLIC PANEL
ALT: 18 IU/L (ref 0–32)
AST: 35 IU/L (ref 0–40)
Albumin/Globulin Ratio: 1.3 (ref 1.2–2.2)
Albumin: 3.7 g/dL — ABNORMAL LOW (ref 3.8–4.8)
Alkaline Phosphatase: 58 IU/L (ref 39–117)
BUN/Creatinine Ratio: 16 (ref 12–28)
BUN: 12 mg/dL (ref 8–27)
Bilirubin Total: 0.4 mg/dL (ref 0.0–1.2)
CO2: 26 mmol/L (ref 20–29)
Calcium: 9.1 mg/dL (ref 8.7–10.3)
Chloride: 105 mmol/L (ref 96–106)
Creatinine, Ser: 0.74 mg/dL (ref 0.57–1.00)
GFR calc Af Amer: 98 mL/min/{1.73_m2} (ref >59)
GFR calc non Af Amer: 85 mL/min/{1.73_m2} (ref >59)
Globulin, Total: 2.9 g/dL (ref 1.5–4.5)
Glucose: 92 mg/dL (ref 65–99)
Potassium: 4.3 mmol/L (ref 3.5–5.2)
Sodium: 143 mmol/L (ref 134–144)
Total Protein: 6.6 g/dL (ref 6.0–8.5)

## 2019-05-11 ENCOUNTER — Ambulatory Visit (INDEPENDENT_AMBULATORY_CARE_PROVIDER_SITE_OTHER): Payer: Medicare Other | Admitting: Physician Assistant

## 2019-05-11 ENCOUNTER — Other Ambulatory Visit: Payer: Self-pay

## 2019-05-11 ENCOUNTER — Encounter: Payer: Self-pay | Admitting: Physician Assistant

## 2019-05-11 VITALS — BP 129/63 | HR 74 | Temp 99.0°F | Ht 65.0 in | Wt 171.0 lb

## 2019-05-11 DIAGNOSIS — E785 Hyperlipidemia, unspecified: Secondary | ICD-10-CM

## 2019-05-11 DIAGNOSIS — J449 Chronic obstructive pulmonary disease, unspecified: Secondary | ICD-10-CM | POA: Diagnosis not present

## 2019-05-11 DIAGNOSIS — I2510 Atherosclerotic heart disease of native coronary artery without angina pectoris: Secondary | ICD-10-CM

## 2019-05-11 NOTE — Patient Instructions (Signed)
Medication Instructions:  Your physician recommends that you continue on your current medications as directed. Please refer to the Current Medication list given to you today.  If you need a refill on your cardiac medications before your next appointment, please call your pharmacy.   Lab work: NONE ordered at this time of appointment   If you have labs (blood work) drawn today and your tests are completely normal, you will receive your results only by: Marland Kitchen MyChart Message (if you have MyChart) OR . A paper copy in the mail If you have any lab test that is abnormal or we need to change your treatment, we will call you to review the results.  Testing/Procedures: NONE ordered at this time of appointment   Follow-Up: At [facility], you and your health needs are our priority.  As part of our continuing mission to provide you with exceptional heart care, we have created designated Provider Care Teams.  These Care Teams include your primary Cardiologist (physician) and Advanced Practice Providers (APPs -  Physician Assistants and Nurse Practitioners) who all work together to provide you with the care you need, when you need it. . You will need a follow up appointment in 12 months-October 2021.  Please call our office in July and/or August 2021 to schedule this appointment.  You may see Pixie Casino, MD   Any Other Special Instructions Will Be Listed Below (If Applicable).

## 2019-05-11 NOTE — Progress Notes (Signed)
Cardiology Office Note    Date:  05/12/2019   ID:  Raven Torres, DOB Nov 22, 1955, MRN [id number]  PCP:  Ladell Pier, MD  Cardiologist:  Dr. Debara Pickett  Chief Complaint  Patient presents with  . Follow-up    seen for Dr. Debara Pickett    History of Present Illness:  Raven Torres is a 63 y.o. female with PMH of coronary artery calcification, HLD, history of TB and hepatitis C, COPD, and tobacco abuse.  Patient was previously seen by Dr. Debara Pickett for worsening dyspnea on exertion.  Myoview demonstrated normal EF 72%, no ischemia.  It was suspected that her dyspnea on exertion was related to pulmonary issue.  Given her multivessel coronary artery calcification seen on previous imaging, it was recommended to tightly control her cholesterol with LDL goal less than 70.  Lipitor was increased to 40 mg daily during the last office visit on 06/24/2017.  Her most recent pulmonology visit was on 02/03/2019, at which time she continued to smoke on a daily basis.  She was being followed by low density CT scanning for lung nodules.  55-month follow-up was recommended.  Her PCP recommended referral to neurology for memory issues, however she declined.  Patient presents today for cardiology office visit.  She denies any recent chest pain or worsening shortness of breath.  She has no shortness of breath with everyday activity but does have shortness of breath with more extreme activity.  This is likely related to history of smoking.  She continues to smoke 3 to 4 cigarettes/day.  Emphasis has been placed on tobacco cessation.  She has no lower extremity edema, orthopnea or PND.  Recent lab work showed a very well-controlled cholesterol.  Otherwise she can follow-up in 1 year.   Past Medical History:  Diagnosis Date  . Arthritis   . Colitis   . Hepatitis C   . TB (tuberculosis)     Past Surgical History:  Procedure Laterality Date  . ABDOMINAL HYSTERECTOMY    . KNEE SURGERY Right     Current  Medications: Outpatient Medications Prior to Visit  Medication Sig Dispense Refill  . albuterol (VENTOLIN HFA) 108 (90 Base) MCG/ACT inhaler INHALE 2 PUFFS INTO THE LUNGS EVERY 6 HOURS AS NEEDED FOR WHEEZING OR SHORTNESS OF BREATH 8.5 g 2  . aspirin EC 81 MG tablet Take 1 tablet (81 mg total) by mouth daily. 30 tablet 0  . atorvastatin (LIPITOR) 40 MG tablet Take 1 tablet (40 mg total) by mouth daily at 6 PM. NEED OV. 30 tablet 4  . buPROPion (WELLBUTRIN) 100 MG tablet Take 1 tablet (100 mg total) by mouth 2 (two) times daily. MUST MAKE APPT FOR FURTHER REFILLS 60 tablet 0  . CALCIUM CARBONATE-VIT D-MIN PO Take 1,200 mg by mouth.    . Cholecalciferol (VITAMIN D3) 1000 units CAPS Take by mouth.    . diclofenac sodium (VOLTAREN) 1 % GEL Apply 2 g topically 4 (four) times daily. 1 Tube 2  . folic acid (FOLVITE) 1 MG tablet TK 1 T PO  D  3  . Glycopyrrolate-Formoterol (BEVESPI AEROSPHERE) 9-4.8 MCG/ACT AERO Inhale 2 puffs into the lungs 2 (two) times a day. 1 Inhaler 4  . hydrochlorothiazide (MICROZIDE) 12.5 MG capsule Take 1 capsule (12.5 mg total) by mouth daily. Must have appt for more refills. 30 capsule 0  . hydroxychloroquine (PLAQUENIL) 200 MG tablet Take 200 mg by mouth 2 (two) times daily.    Marland Kitchen lisinopril (ZESTRIL) 10 MG tablet  Take 1 tablet (10 mg total) by mouth daily. Must have appt for more refills. 30 tablet 0  . sulfaSALAzine (AZULFIDINE) 500 MG EC tablet Take by mouth.    . gabapentin (NEURONTIN) 300 MG capsule Take 1 capsule (300 mg total) by mouth daily. (Patient not taking: Reported on 05/11/2019) 90 capsule 3   No facility-administered medications prior to visit.      Allergies:   Tudorza pressair [aclidinium bromide], Amitriptyline, Codeine, and Tramadol   Social History   Socioeconomic History  . Marital status: Married    Spouse name: Not on file  . Number of children: Not on file  . Years of education: Not on file  . Highest education level: Not on file  Occupational  History  . Not on file  Social Needs  . Financial resource strain: Not on file  . Food insecurity    Worry: Not on file    Inability: Not on file  . Transportation needs    Medical: Not on file    Non-medical: Not on file  Tobacco Use  . Smoking status: Current Every Day Smoker    Packs/day: 1.00    Years: 49.00    Pack years: 49.00    Types: Cigarettes  . Smokeless tobacco: Never Used  . Tobacco comment: smoking 5 cig a week now as of Feb 2020  Substance and Sexual Activity  . Alcohol use: No    Alcohol/week: 0.0 standard drinks  . Drug use: No  . Sexual activity: Never  Lifestyle  . Physical activity    Days per week: Not on file    Minutes per session: Not on file  . Stress: Not on file  Relationships  . Social Herbalist on phone: Not on file    Gets together: Not on file    Attends religious service: Not on file    Active member of club or organization: Not on file    Attends meetings of clubs or organizations: Not on file    Relationship status: Not on file  Other Topics Concern  . Not on file  Social History Narrative  . Not on file     Family History:  The patient's family history includes Cancer in her mother; Heart disease in her mother.   ROS:   Please see the history of present illness.    ROS All other systems reviewed and are negative.   PHYSICAL EXAM:   VS:  BP 129/63   Pulse 74   Temp 99 F (37.2 C)   Ht 5\' 5"  (1.651 m)   Wt 171 lb (77.6 kg)   SpO2 98%   BMI 28.46 kg/m    GEN: Well nourished, well developed, in no acute distress  HEENT: normal  Neck: no JVD, carotid bruits, or masses Cardiac: RRR; no murmurs, rubs, or gallops,no edema  Respiratory:  clear to auscultation bilaterally, normal work of breathing GI: soft, nontender, nondistended, + BS MS: no deformity or atrophy  Skin: warm and dry, no rash Neuro:  Alert and Oriented x 3, Strength and sensation are intact Psych: euthymic mood, full affect  Wt Readings from  Last 3 Encounters:  05/11/19 171 lb (77.6 kg)  05/07/19 170 lb (77.1 kg)  09/23/18 160 lb 6.4 oz (72.8 kg)      Studies/Labs Reviewed:   EKG:  EKG is ordered today.  The ekg ordered today demonstrates NSR without significant ST-T wave changes  Recent Labs: 05/07/2019: ALT 18; BUN 12;  Creatinine, Ser 0.74; Hemoglobin 14.2; Platelets 145; Potassium 4.3; Sodium 143   Lipid Panel    Component Value Date/Time   CHOL 166 05/07/2019 1058   TRIG 74 05/07/2019 1058   HDL 81 05/07/2019 1058   CHOLHDL 2.0 05/07/2019 1058   CHOLHDL 2.5 02/26/2016 1132   VLDL 28 02/26/2016 1132   LDLCALC 71 05/07/2019 1058    Additional studies/ records that were reviewed today include:   Myoview 06/11/2017 Study Highlights   Nuclear stress EF: 72%.  The left ventricular ejection fraction is hyperdynamic (>65%).  There was no ST segment deviation noted during stress.  No T wave inversion was noted during stress.  The study is normal.  This is a low risk study.      ASSESSMENT:    1. Coronary artery arteriosclerosis   2. Hyperlipidemia, unspecified hyperlipidemia type   3. Chronic obstructive pulmonary disease, unspecified COPD type ([place])      PLAN:  In order of problems listed above:  1. Coronary artery atherosclerosis: Seen on previous CT.  Myoview in 2018 was negative.  Continue to monitor  2. Hyperlipidemia: Continue Lipitor 40 mg daily.  3. COPD: Baseline dyspnea on exertion however does not occur with everyday activity.   Medication Adjustments/Labs and Tests Ordered: Current medicines are reviewed at length with the patient today.  Concerns regarding medicines are outlined above.  Medication changes, Labs and Tests ordered today are listed in the Patient Instructions below. Patient Instructions  Medication Instructions:  Your physician recommends that you continue on your current medications as directed. Please refer to the Current Medication list given to you today.  If  you need a refill on your cardiac medications before your next appointment, please call your pharmacy.   Lab work: NONE ordered at this time of appointment   If you have labs (blood work) drawn today and your tests are completely normal, you will receive your results only by: Marland Kitchen MyChart Message (if you have MyChart) OR . A paper copy in the mail If you have any lab test that is abnormal or we need to change your treatment, we will call you to review the results.  Testing/Procedures: NONE ordered at this time of appointment   Follow-Up: At [facility], you and your health needs are our priority.  As part of our continuing mission to provide you with exceptional heart care, we have created designated Provider Care Teams.  These Care Teams include your primary Cardiologist (physician) and Advanced Practice Providers (APPs -  Physician Assistants and Nurse Practitioners) who all work together to provide you with the care you need, when you need it. . You will need a follow up appointment in 12 months-October 2021.  Please call our office in July and/or August 2021 to schedule this appointment.  You may see Pixie Casino, MD   Any Other Special Instructions Will Be Listed Below (If Applicable).       Hilbert Corrigan, [place]  05/12/2019 12:20 AM    [facility] [address], [place], [place]  [address] Phone: [phone]; Fax: [phone]

## 2019-05-12 ENCOUNTER — Encounter: Payer: Self-pay | Admitting: Physician Assistant

## 2019-05-18 ENCOUNTER — Other Ambulatory Visit: Payer: Self-pay | Admitting: Internal Medicine

## 2019-05-18 DIAGNOSIS — Z1231 Encounter for screening mammogram for malignant neoplasm of breast: Secondary | ICD-10-CM

## 2019-05-26 ENCOUNTER — Other Ambulatory Visit: Payer: Self-pay | Admitting: Internal Medicine

## 2019-05-26 DIAGNOSIS — I10 Essential (primary) hypertension: Secondary | ICD-10-CM

## 2019-07-06 ENCOUNTER — Ambulatory Visit
Admission: RE | Admit: 2019-07-06 | Discharge: 2019-07-06 | Disposition: A | Discharge status: Home / Self Care | Payer: Medicare Other | Source: Ambulatory Visit | Attending: Internal Medicine | Admitting: Internal Medicine

## 2019-07-06 ENCOUNTER — Other Ambulatory Visit: Payer: Self-pay

## 2019-07-06 DIAGNOSIS — Z1231 Encounter for screening mammogram for malignant neoplasm of breast: Secondary | ICD-10-CM

## 2019-07-09 DIAGNOSIS — L573 Poikiloderma of Civatte: Secondary | ICD-10-CM | POA: Diagnosis not present

## 2019-07-09 DIAGNOSIS — L309 Dermatitis, unspecified: Secondary | ICD-10-CM | POA: Diagnosis not present

## 2019-07-09 DIAGNOSIS — L570 Actinic keratosis: Secondary | ICD-10-CM | POA: Diagnosis not present

## 2019-07-21 ENCOUNTER — Other Ambulatory Visit: Payer: Self-pay

## 2019-07-21 MED ORDER — BEVESPI AEROSPHERE 9-4.8 MCG/ACT IN AERO: 2.0000 | INHALATION_SPRAY | Freq: Two times a day (BID) | RESPIRATORY_TRACT | 4 refills | Status: DC

## 2019-08-19 ENCOUNTER — Other Ambulatory Visit: Payer: Self-pay | Admitting: Internal Medicine

## 2019-09-07 ENCOUNTER — Other Ambulatory Visit: Payer: Self-pay

## 2019-09-07 ENCOUNTER — Ambulatory Visit: Payer: Medicare Other | Attending: Internal Medicine | Admitting: Internal Medicine

## 2019-09-07 DIAGNOSIS — Z7982 Long term (current) use of aspirin: Secondary | ICD-10-CM | POA: Diagnosis not present

## 2019-09-07 DIAGNOSIS — I10 Essential (primary) hypertension: Secondary | ICD-10-CM | POA: Diagnosis not present

## 2019-09-07 DIAGNOSIS — I708 Atherosclerosis of other arteries: Secondary | ICD-10-CM

## 2019-09-07 DIAGNOSIS — Z79899 Other long term (current) drug therapy: Secondary | ICD-10-CM | POA: Insufficient documentation

## 2019-09-07 DIAGNOSIS — M797 Fibromyalgia: Secondary | ICD-10-CM | POA: Insufficient documentation

## 2019-09-07 DIAGNOSIS — K7469 Other cirrhosis of liver: Secondary | ICD-10-CM | POA: Insufficient documentation

## 2019-09-07 DIAGNOSIS — I2510 Atherosclerotic heart disease of native coronary artery without angina pectoris: Secondary | ICD-10-CM | POA: Diagnosis not present

## 2019-09-07 DIAGNOSIS — J449 Chronic obstructive pulmonary disease, unspecified: Secondary | ICD-10-CM

## 2019-09-07 DIAGNOSIS — B1920 Unspecified viral hepatitis C without hepatic coma: Secondary | ICD-10-CM | POA: Diagnosis not present

## 2019-09-07 DIAGNOSIS — M0579 Rheumatoid arthritis with rheumatoid factor of multiple sites without organ or systems involvement: Secondary | ICD-10-CM

## 2019-09-07 DIAGNOSIS — F17200 Nicotine dependence, unspecified, uncomplicated: Secondary | ICD-10-CM | POA: Insufficient documentation

## 2019-09-07 DIAGNOSIS — I7090 Unspecified atherosclerosis: Secondary | ICD-10-CM

## 2019-09-07 MED ORDER — ATORVASTATIN CALCIUM 40 MG PO TABS: 40.0000 mg | ORAL_TABLET | Freq: Every day | ORAL | 6 refills | Status: DC

## 2019-09-07 MED ORDER — BUPROPION HCL 100 MG PO TABS: 100.0000 mg | ORAL_TABLET | Freq: Two times a day (BID) | ORAL | 6 refills | Status: DC

## 2019-09-07 MED ORDER — LISINOPRIL-HYDROCHLOROTHIAZIDE 10-12.5 MG PO TABS: 1.0000 | ORAL_TABLET | Freq: Every day | ORAL | 2 refills | Status: DC

## 2019-09-07 NOTE — Progress Notes (Signed)
Virtual Visit via Telephone Note Due to current restrictions/limitations of in-office visits due to the COVID-19 pandemic, this scheduled clinical appointment was converted to a telehealth visit  I connected with Raven Torres on 09/07/19 at 8:37 a.m by telephone and verified that [name] speaking with the correct person using two identifiers. [name] in my office.  The patient is at home.  Only the patient and myself participated in this encounter.  I discussed the limitations, risks, security and privacy concerns of performing an evaluation and management service by telephone and the availability of in person appointments. I also discussed with the patient that there may be a patient responsible charge related to this service. The patient expressed understanding and agreed to proceed.   History of Present Illness: HTN, cured hepatitis C with cirrhosis, adjustment disorder, HL, tobacco dependence, and rheumatoid arthritis/fibromyalgia (Dr. Posey Pronto at [facility]) , memory decline (MMSE 05/2019 was 30/30), COPD GOLD III, lung nodules (Dr. Vaughan Browner), coronary atherosclerosis.  Last seen 05/2019.  Dermatitis:  Saw derm at [facility].  Reports being told that she has extremely dry skin. Given Triamcinolone cream which has help.  RA:  Last saw Dr. Posey Pronto 12/2018.  Next appt 09/14/2019.   Compliant with HQ and SSZ. Little pains in lower back and hips I resubmitted referral to optometrist Dr. Domingo Cocking per her request but pt states she has not been called and is still skeptical about seeing an eye doctor during the pandemic.  She has his card and plans to call this wk.  COPD GOLD III:  using Bevespi BID and Proventil once a day as needed.  Coughs up mucus in mornings.  "Not as wheezy in my chest."   -still smoking 2-3 cig/day.  "I'm very close to quitting but stress about being couped up."  -she is out of Wellbutrin.  Request RF  Cured hep C with cirrhosis: last [place] 03/2018.  She is due for repeat study.  HTN:   Checks BP occasionally.  Staying below 130/80.  No CP or LE edema.  Limits salt in foods.  Compliant with Lisinopril/HCZT  Outpatient Encounter Medications as of 09/07/2019  Medication Sig  . albuterol (VENTOLIN HFA) 108 (90 Base) MCG/ACT inhaler INHALE 2 PUFFS INTO THE LUNGS EVERY 6 HOURS AS NEEDED FOR WHEEZING OR SHORTNESS OF BREATH  . aspirin EC 81 MG tablet Take 1 tablet (81 mg total) by mouth daily.  Marland Kitchen atorvastatin (LIPITOR) 40 MG tablet Take 1 tablet (40 mg total) by mouth daily at 6 PM. NEED OV.  Marland Kitchen buPROPion (WELLBUTRIN) 100 MG tablet Take 1 tablet (100 mg total) by mouth 2 (two) times daily. MUST MAKE APPT FOR FURTHER REFILLS  . CALCIUM CARBONATE-VIT D-MIN PO Take 1,200 mg by mouth.  . Cholecalciferol (VITAMIN D3) 1000 units CAPS Take by mouth.  . diclofenac sodium (VOLTAREN) 1 % GEL Apply 2 g topically 4 (four) times daily.  . folic acid (FOLVITE) 1 MG tablet TK 1 T PO  D  . Glycopyrrolate-Formoterol (BEVESPI AEROSPHERE) 9-4.8 MCG/ACT AERO Inhale 2 puffs into the lungs 2 (two) times daily.  . hydroxychloroquine (PLAQUENIL) 200 MG tablet Take 200 mg by mouth 2 (two) times daily.  Marland Kitchen lisinopril-hydrochlorothiazide (ZESTORETIC) 10-12.5 MG tablet TAKE 1 TABLET BY MOUTH DAILY  . sulfaSALAzine (AZULFIDINE) 500 MG EC tablet Take by mouth.   No facility-administered encounter medications on file as of 09/07/2019.    Observations/Objective: Results for orders placed or performed in visit on 05/07/19  CBC  Result Value Ref Range  WBC 6.0 3.4 - 10.8 x10E3/uL   RBC 4.60 3.77 - 5.28 x10E6/uL   Hemoglobin 14.2 11.1 - 15.9 g/dL   Hematocrit 42.6 34.0 - 46.6 %   MCV 93 79 - 97 fL   MCH 30.9 26.6 - 33.0 pg   MCHC 33.3 31.5 - 35.7 g/dL   RDW 12.8 11.7 - 15.4 %   Platelets 145 (L) 150 - 450 x10E3/uL  Comprehensive metabolic panel  Result Value Ref Range   Glucose 92 65 - 99 mg/dL   BUN 12 8 - 27 mg/dL   Creatinine, Ser 0.74 0.57 - 1.00 mg/dL   GFR calc non Af Amer 85 >59 mL/min/1.73   GFR  calc Af Amer 98 >59 mL/min/1.73   BUN/Creatinine Ratio 16 12 - 28   Sodium 143 134 - 144 mmol/L   Potassium 4.3 3.5 - 5.2 mmol/L   Chloride 105 96 - 106 mmol/L   CO2 26 20 - 29 mmol/L   Calcium 9.1 8.7 - 10.3 mg/dL   Total Protein 6.6 6.0 - 8.5 g/dL   Albumin 3.7 (L) 3.8 - 4.8 g/dL   Globulin, Total 2.9 1.5 - 4.5 g/dL   Albumin/Globulin Ratio 1.3 1.2 - 2.2   Bilirubin Total 0.4 0.0 - 1.2 mg/dL   Alkaline Phosphatase 58 39 - 117 IU/L   AST 35 0 - 40 IU/L   ALT 18 0 - 32 IU/L  Lipid panel  Result Value Ref Range   Cholesterol, Total 166 100 - 199 mg/dL   Triglycerides 74 0 - 149 mg/dL   HDL 81 >39 mg/dL   VLDL Cholesterol Cal 14 5 - 40 mg/dL   LDL Chol Calc (NIH) 71 0 - 99 mg/dL   Chol/HDL Ratio 2.0 0.0 - 4.4 ratio     Assessment and Plan: 1. GOLD COPD III B Stable on Bevespi inhaler Commended her on how much she has cut back on smoking.  Continue to encourage her to quit completely  2. Essential hypertension Reported home blood pressure readings are at goal.  Continue low-salt diet and lisinopril/HCTZ - lisinopril-hydrochlorothiazide (ZESTORETIC) 10-12.5 MG tablet; Take 1 tablet by mouth daily.  Dispense: 90 tablet; Refill: 2  3. Rheumatoid arthritis involving multiple sites with positive rheumatoid factor ([place]) Followed by rheumatologist Dr. Posey Pronto.  Strongly encouraged her to get her eye exam done given that she is on Plaquenil.  Advised that she needs to get eye exam done at least once a year while on this medication  4. Other cirrhosis of liver (HCC) - US Abdomen Limited RUQ; Future  5. Tobacco dependence Commended her on how much she has cut back.  Continue to encourage her to quit completely.  Encouraged her to set a quit date.  Refill Wellbutrin.  Less than 5 minutes spent on counseling - buPROPion (WELLBUTRIN) 100 MG tablet; Take 1 tablet (100 mg total) by mouth 2 (two) times daily.  Dispense: 60 tablet; Refill: 6  6. Atherosclerosis of arteries - atorvastatin  (LIPITOR) 40 MG tablet; Take 1 tablet (40 mg total) by mouth daily at 6 PM. NEED OV.  Dispense: 30 tablet; Refill: 6   Follow Up Instructions: 4 mths   I discussed the assessment and treatment plan with the patient. The patient was provided an opportunity to ask questions and all were answered. The patient agreed with the plan and demonstrated an understanding of the instructions.   The patient was advised to call back or seek an in-person evaluation if the symptoms worsen or if  the condition fails to improve as anticipated.  I provided 20 minutes of non-face-to-face time during this encounter.   Karle Plumber, MD

## 2019-09-14 ENCOUNTER — Other Ambulatory Visit: Payer: Self-pay

## 2019-09-14 ENCOUNTER — Ambulatory Visit (HOSPITAL_COMMUNITY)
Admission: RE | Admit: 2019-09-14 | Discharge: 2019-09-14 | Disposition: A | Discharge status: Home / Self Care | Payer: Medicare Other | Source: Ambulatory Visit | Attending: Internal Medicine | Admitting: Internal Medicine

## 2019-09-14 DIAGNOSIS — K7469 Other cirrhosis of liver: Secondary | ICD-10-CM | POA: Insufficient documentation

## 2019-09-22 ENCOUNTER — Telehealth: Payer: Self-pay

## 2019-09-22 NOTE — Telephone Encounter (Signed)
Contacted pt to go over US results pt is aware and doesn't have any questions or concerns  

## 2019-10-03 ENCOUNTER — Other Ambulatory Visit: Payer: Self-pay

## 2019-10-03 ENCOUNTER — Ambulatory Visit: Payer: Medicare Other | Attending: Internal Medicine

## 2019-10-03 DIAGNOSIS — Z23 Encounter for immunization: Secondary | ICD-10-CM

## 2019-10-03 NOTE — Progress Notes (Signed)
   Covid-19 Vaccination Clinic  Name:  Raven Torres    MRN: [id number] DOB: 02-22-1956  10/03/2019  [name] was observed post Covid-19 immunization for 15 minutes without incidence. She was provided with Vaccine Information Sheet and instruction to access the V-Safe system.   [name] was instructed to call 911 with any severe reactions post vaccine: Marland Kitchen Difficulty breathing  . Swelling of your face and throat  . A fast heartbeat  . A bad rash all over your body  . Dizziness and weakness    Immunizations Administered    Name Date Dose VIS Date Route   Pfizer COVID-19 Vaccine 10/03/2019  9:16 AM 0.3 mL 07/16/2019 Intramuscular   Manufacturer: [place]   Lot: [id number]   [place]: [id number]

## 2019-11-02 ENCOUNTER — Ambulatory Visit: Payer: Medicare Other | Attending: Internal Medicine

## 2019-11-02 DIAGNOSIS — Z23 Encounter for immunization: Secondary | ICD-10-CM

## 2019-11-02 NOTE — Progress Notes (Signed)
   Covid-19 Vaccination Clinic  Name:  Raven Torres    MRN: [id number] DOB: [birth year]  11/02/2019  [name] was observed post Covid-19 immunization for 15 minutes without incident. She was provided with Vaccine Information Sheet and instruction to access the V-Safe system.   [name] was instructed to call 911 with any severe reactions post vaccine: Marland Kitchen Difficulty breathing  . Swelling of face and throat  . A fast heartbeat  . A bad rash all over body  . Dizziness and weakness   Immunizations Administered    Name Date Dose VIS Date Route   Pfizer COVID-19 Vaccine 11/02/2019  8:28 AM 0.3 mL 07/16/2019 Intramuscular   Manufacturer: [place]   Lot: [id number]   [place]: [id number]

## 2019-11-03 ENCOUNTER — Telehealth: Payer: Self-pay | Admitting: Internal Medicine

## 2019-11-03 NOTE — Telephone Encounter (Signed)
Patient called to let her PCP know that she has received her COVID vaccine.

## 2020-01-07 ENCOUNTER — Other Ambulatory Visit: Payer: Self-pay

## 2020-01-07 ENCOUNTER — Ambulatory Visit: Payer: Medicare Other | Attending: Internal Medicine | Admitting: Internal Medicine

## 2020-01-07 ENCOUNTER — Encounter: Payer: Self-pay | Admitting: Internal Medicine

## 2020-01-07 VITALS — BP 131/76 | HR 78 | Temp 99.9°F | Resp 16 | Wt 162.0 lb

## 2020-01-07 DIAGNOSIS — I10 Essential (primary) hypertension: Secondary | ICD-10-CM

## 2020-01-07 DIAGNOSIS — M546 Pain in thoracic spine: Secondary | ICD-10-CM

## 2020-01-07 DIAGNOSIS — G8929 Other chronic pain: Secondary | ICD-10-CM

## 2020-01-07 DIAGNOSIS — Z780 Asymptomatic menopausal state: Secondary | ICD-10-CM

## 2020-01-07 DIAGNOSIS — F17200 Nicotine dependence, unspecified, uncomplicated: Secondary | ICD-10-CM

## 2020-01-07 DIAGNOSIS — J449 Chronic obstructive pulmonary disease, unspecified: Secondary | ICD-10-CM

## 2020-01-07 DIAGNOSIS — M0579 Rheumatoid arthritis with rheumatoid factor of multiple sites without organ or systems involvement: Secondary | ICD-10-CM | POA: Diagnosis not present

## 2020-01-07 DIAGNOSIS — R634 Abnormal weight loss: Secondary | ICD-10-CM

## 2020-01-07 DIAGNOSIS — M542 Cervicalgia: Secondary | ICD-10-CM

## 2020-01-07 DIAGNOSIS — K7469 Other cirrhosis of liver: Secondary | ICD-10-CM

## 2020-01-07 NOTE — Progress Notes (Signed)
Patient ID: Raven Torres, female    DOB: 1956/04/14  MRN: [id number]  CC: Hypertension   Subjective: Raven Torres is a 64 y.o. female who presents for chronic ds management Her concerns today include:  HTN, cured hepatitis C with cirrhosis, adjustment disorder, HL, tobacco dependence, and rheumatoid arthritis/fibromyalgia(Dr. Posey Pronto at [facility]) , memory decline (MMSE 05/2019 was 30/30), COPD GOLD III, lung nodules (Dr. Vaughan Browner), coronary atherosclerosis.  HM:  Due for c-scope.  Received letter from gastroenterologist who did her last colonoscopy informing her that it is time to make an appointment for repeat testing.  Waiting to schedule until later this yr. Due for Dexa scan.  She is willing to have this scheduled.  RA:  Saw Dr. Posey Pronto 09/2019.  No changes made in meds -reports having had eye exam for retinopathy screening given that she is on Plaquenil -today she c/o having pain LT side of neck x several mths.  Hurting more than it use to..  Started in middle of back since last yr. No radicular symptoms in arms. Reports rheumatologist told her may be slip disc.   Tob dep:  Still trying to quit.  Down to 1-2 cigarettes a day. Wellbutrin helps  COPD GOLD III:  using Bevespi BID and Proventil once a day as needed.  Has appt coming up for CT scan chest to follow up of lung nodules  -Denies any increased cough or shortness of breath.  No fever  HTN:  Compliant with Lisinopril/HCTZ.  Home BP monitor no longer works.  Limits salt in foods.  No chest pains or lower extremity edema.  Works a lot in her garden to stay active  Hep C cirrhosis:  US done 09/2019 revealed no liver lesions suggestive of HCC.  She is noted to have 9 pound weight loss since October of last year.  She reports that some of her medicines decreased her appetite.  She denies any abdominal pain.  No changes in bowel movements or blood in the stools.  She will be getting a follow-up CAT scan of the chest coming up through  her pulmonologist for follow-up on lung nodules.  She plans to reschedule her colonoscopy for later in the year Patient Active Problem List   Diagnosis Date Noted  . GOLD COPD III B 04/23/2018  . Pruritic dermatitis 04/23/2018  . High cholesterol 06/24/2017  . Shortness of breath 06/04/2017  . Coronary artery calcification 06/04/2017  . Other fatigue 06/04/2017  . Centrilobular emphysema ([place]) 05/15/2017  . Atherosclerosis of arteries 05/15/2017  . Poor memory 05/15/2017  . TB lung, latent 04/16/2017  . Lung nodules 04/16/2017  . Diabetes mellitus screening 11/19/2016  . Fibromyalgia 05/15/2015  . Rheumatoid arthritis ([place]) 08/22/2014  . HTN (hypertension) 08/22/2014  . Tobacco abuse 08/22/2014  . Hepatic cirrhosis ([place]) 05/31/2014  . Chronic hepatitis C virus infection ([place]) 05/03/2014     Current Outpatient Medications on File Prior to Visit  Medication Sig Dispense Refill  . albuterol (VENTOLIN HFA) 108 (90 Base) MCG/ACT inhaler INHALE 2 PUFFS INTO THE LUNGS EVERY 6 HOURS AS NEEDED FOR WHEEZING OR SHORTNESS OF BREATH 8.5 g 2  . aspirin EC 81 MG tablet Take 1 tablet (81 mg total) by mouth daily. 30 tablet 0  . atorvastatin (LIPITOR) 40 MG tablet Take 1 tablet (40 mg total) by mouth daily at 6 PM. NEED OV. 30 tablet 6  . buPROPion (WELLBUTRIN) 100 MG tablet Take 1 tablet (100 mg total) by mouth 2 (two) times daily.  60 tablet 6  . CALCIUM CARBONATE-VIT D-MIN PO Take 1,200 mg by mouth.    . Cholecalciferol (VITAMIN D3) 1000 units CAPS Take by mouth.    . diclofenac sodium (VOLTAREN) 1 % GEL Apply 2 g topically 4 (four) times daily. 1 Tube 2  . folic acid (FOLVITE) 1 MG tablet TK 1 T PO  D  3  . Glycopyrrolate-Formoterol (BEVESPI AEROSPHERE) 9-4.8 MCG/ACT AERO Inhale 2 puffs into the lungs 2 (two) times daily. 10.7 g 4  . hydroxychloroquine (PLAQUENIL) 200 MG tablet Take 200 mg by mouth 2 (two) times daily.    Marland Kitchen lisinopril-hydrochlorothiazide (ZESTORETIC) 10-12.5 MG tablet Take 1  tablet by mouth daily. 90 tablet 2  . sulfaSALAzine (AZULFIDINE) 500 MG EC tablet Take by mouth.    . triamcinolone cream (KENALOG) 0.1 % APPLY BID TO AA FOR UP TO TWO WEEKS THEN BID ON THE WEEKENDS ONLY THEREAFTER. DO NOT USE ON FACE OR SKIN FOLDS     No current facility-administered medications on file prior to visit.    Allergies  Allergen Reactions  . Tudorza Pressair [Aclidinium Bromide] Itching  . Amitriptyline Other (See Comments)    Eye spasms per pt  . Codeine Nausea Only  . Tramadol Nausea Only    Social History   Socioeconomic History  . Marital status: Married    Spouse name: Not on file  . Number of children: Not on file  . Years of education: Not on file  . Highest education level: Not on file  Occupational History  . Not on file  Tobacco Use  . Smoking status: Current Every Day Smoker    Packs/day: 1.00    Years: 49.00    Pack years: 49.00    Types: Cigarettes  . Smokeless tobacco: Never Used  . Tobacco comment: smoking 5 cig a week now as of Feb 2020  Substance and Sexual Activity  . Alcohol use: No    Alcohol/week: 0.0 standard drinks  . Drug use: No  . Sexual activity: Never  Other Topics Concern  . Not on file  Social History Narrative  . Not on file   Social Determinants of Health   Financial Resource Strain:   . Difficulty of Paying Living Expenses:   Food Insecurity:   . Worried About Charity fundraiser in the Last Year:   . Arboriculturist in the Last Year:   Transportation Needs:   . Film/video editor (Medical):   Marland Kitchen Lack of Transportation (Non-Medical):   Physical Activity:   . Days of Exercise per Week:   . Minutes of Exercise per Session:   Stress:   . Feeling of Stress :   Social Connections:   . Frequency of Communication with Friends and Family:   . Frequency of Social Gatherings with Friends and Family:   . Attends Religious Services:   . Active Member of Clubs or Organizations:   . Attends Archivist  Meetings:   Marland Kitchen Marital Status:   Intimate Partner Violence:   . Fear of Current or Ex-Partner:   . Emotionally Abused:   Marland Kitchen Physically Abused:   . Sexually Abused:     Family History  Problem Relation Age of Onset  . Heart disease Mother   . Cancer Mother     Past Surgical History:  Procedure Laterality Date  . ABDOMINAL HYSTERECTOMY    . KNEE SURGERY Right     ROS: Review of Systems Negative except as stated above  PHYSICAL EXAM: BP 131/76   Pulse 78   Temp 99.9 F (37.7 C)   Resp 16   Wt 162 lb (73.5 kg)   SpO2 95%   BMI 26.96 kg/m   Wt Readings from Last 3 Encounters:  01/07/20 162 lb (73.5 kg)  05/11/19 171 lb (77.6 kg)  05/07/19 170 lb (77.1 kg)    Physical Exam General appearance - alert, well appearing, and in no distress Mental status - normal mood, behavior, speech, dress, motor activity, and thought processes Eyes - pupils equal and reactive, extraocular eye movements intact Mouth - mucous membranes moist, pharynx normal without lesions Neck - supple, no significant adenopathy Chest -breath sounds mild to moderately decreased.  No wheezes or crackles heard. Heart - normal rate, regular rhythm, normal S1, S2, no murmurs, rubs, clicks or gallops Musculoskeletal -no tenderness on palpation of cervical and thoracic spine.  Slow passive range of motion of the neck. Extremities -no lower extremity edema.  CMP Latest Ref Rng & Units 05/07/2019 11/19/2016 02/26/2016  Glucose 65 - 99 mg/dL 92 103(H) 103(H)  BUN 8 - 27 mg/dL 12 7(L) 8  Creatinine 0.57 - 1.00 mg/dL 0.74 0.81 0.70  Sodium 134 - 144 mmol/L 143 138 137  Potassium 3.5 - 5.2 mmol/L 4.3 4.9 3.9  Chloride 96 - 106 mmol/L 105 98 107  CO2 20 - 29 mmol/L 26 26 24   Calcium 8.7 - 10.3 mg/dL 9.1 10.1 9.1  Total Protein 6.0 - 8.5 g/dL 6.6 - 7.0  Total Bilirubin 0.0 - 1.2 mg/dL 0.4 - 0.6  Alkaline Phos 39 - 117 IU/L 58 - 52  AST 0 - 40 IU/L 35 - 27  ALT 0 - 32 IU/L 18 - 13   Lipid Panel     Component  Value Date/Time   CHOL 166 05/07/2019 1058   TRIG 74 05/07/2019 1058   HDL 81 05/07/2019 1058   CHOLHDL 2.0 05/07/2019 1058   CHOLHDL 2.5 02/26/2016 1132   VLDL 28 02/26/2016 1132   LDLCALC 71 05/07/2019 1058    CBC    Component Value Date/Time   WBC 6.0 05/07/2019 1058   WBC 8.0 02/26/2016 1132   RBC 4.60 05/07/2019 1058   RBC 5.19 (H) 02/26/2016 1132   HGB 14.2 05/07/2019 1058   HCT 42.6 05/07/2019 1058   PLT 145 (L) 05/07/2019 1058   MCV 93 05/07/2019 1058   MCH 30.9 05/07/2019 1058   MCH 30.4 02/26/2016 1132   MCHC 33.3 05/07/2019 1058   MCHC 33.8 02/26/2016 1132   RDW 12.8 05/07/2019 1058   LYMPHSABS 2.9 11/19/2016 1223   MONOABS 720 02/26/2016 1132   EOSABS 0.2 11/19/2016 1223   BASOSABS 0.0 11/19/2016 1223    ASSESSMENT AND PLAN: 1. Essential hypertension Close to goal.  No changes made in medication.  Continue DASH diet  2. GOLD COPD III B Stable on current inhalers.  No recent flares.  Keep follow-up appointment with pulmonology  3. Tobacco dependence Commended her on how she has cut back.  Encouraged her to set a quit date.  Less than 5 minutes spent on counseling  4. Rheumatoid arthritis involving multiple sites with positive rheumatoid factor ([place]) Followed by rheumatology  5. Neck pain, chronic - DG Cervical Spine Complete; Future  6. Chronic midline thoracic back pain - DG Thoracic Spine W/Swimmers; Future  7. Weight loss, unintentional Encourage patient to get up-to-date with age-appropriate cancer screening.  The only one that she needs right now is her colonoscopy which  she states she will schedule later in this year - TSH  8. Post-menopausal Discuss osteoporosis screening.  She is agreeable to having the bone density study - DG BONE DENSITY (DXA); Future  9. Other cirrhosis of liver ([place]) Recent liver ultrasound negative for any suspicious lesions   Patient was given the opportunity to ask questions.  Patient verbalized understanding of  the plan and was able to repeat key elements of the plan.   No orders of the defined types were placed in this encounter.    Requested Prescriptions    No prescriptions requested or ordered in this encounter    No follow-ups on file.  Karle Plumber, MD, FACP

## 2020-01-07 NOTE — Patient Instructions (Addendum)
We have ordered a bone density study to screen for osteoporosis. I have ordered an x-ray of your neck and the thoracic spine given your complaints of chronic pain in these areas.  You can go to the radiology department at [facility] to have these x-rays done.  You do not need to make an appointment.  Please remember to call and schedule your colonoscopy for colon cancer screening.

## 2020-01-08 LAB — TSH: TSH: 2.22 u[IU]/mL (ref 0.450–4.500)

## 2020-01-14 ENCOUNTER — Ambulatory Visit (HOSPITAL_COMMUNITY)
Admission: RE | Admit: 2020-01-14 | Discharge: 2020-01-14 | Disposition: A | Discharge status: Home / Self Care | Payer: Medicare Other | Source: Ambulatory Visit | Attending: Internal Medicine | Admitting: Internal Medicine

## 2020-01-14 DIAGNOSIS — M546 Pain in thoracic spine: Secondary | ICD-10-CM | POA: Diagnosis present

## 2020-01-14 DIAGNOSIS — M542 Cervicalgia: Secondary | ICD-10-CM | POA: Diagnosis present

## 2020-01-14 DIAGNOSIS — G8929 Other chronic pain: Secondary | ICD-10-CM | POA: Insufficient documentation

## 2020-01-20 ENCOUNTER — Ambulatory Visit
Admission: RE | Admit: 2020-01-20 | Discharge: 2020-01-20 | Disposition: A | Discharge status: Home / Self Care | Payer: Medicare Other | Source: Ambulatory Visit | Attending: Acute Care | Admitting: Acute Care

## 2020-01-20 DIAGNOSIS — Z87891 Personal history of nicotine dependence: Secondary | ICD-10-CM

## 2020-01-20 DIAGNOSIS — Z122 Encounter for screening for malignant neoplasm of respiratory organs: Secondary | ICD-10-CM

## 2020-01-20 DIAGNOSIS — F17210 Nicotine dependence, cigarettes, uncomplicated: Secondary | ICD-10-CM

## 2020-01-21 ENCOUNTER — Other Ambulatory Visit: Payer: Self-pay | Admitting: Internal Medicine

## 2020-01-21 DIAGNOSIS — E2839 Other primary ovarian failure: Secondary | ICD-10-CM

## 2020-01-25 ENCOUNTER — Other Ambulatory Visit: Payer: Self-pay | Admitting: *Deleted

## 2020-01-25 DIAGNOSIS — Z87891 Personal history of nicotine dependence: Secondary | ICD-10-CM

## 2020-01-25 DIAGNOSIS — F17210 Nicotine dependence, cigarettes, uncomplicated: Secondary | ICD-10-CM

## 2020-01-25 NOTE — Progress Notes (Signed)
Denise, Please call patient and let them  know their  low dose Ct was read as a Lung RADS 2: nodules that are benign in appearance and behavior with a very low likelihood of becoming a clinically active cancer due to size or lack of growth. Recommendation per radiology is for a repeat LDCT in 12 months. .Please let them  know we will order and schedule their  annual screening scan for 01/2021. Please let them  know there was notation of CAD on their  scan.  Please remind the patient  that this is a non-gated exam therefore degree or severity of disease  cannot be determined. Please have them  follow up with their PCP regarding potential risk factor modification, dietary therapy or pharmacologic therapy if clinically indicated. Pt.  is  currently on statin therapy. Please place order for annual  screening scan for  01/2021 and fax results to PCP. Thanks so much.  Pt is followed by cardiology, but has never had a echo.  Also, please let her know there was notation of cirrhosis per scan. Please have her follow up with PCP for this. Thanks so much

## 2020-02-03 ENCOUNTER — Encounter: Payer: Self-pay | Admitting: Internal Medicine

## 2020-02-10 ENCOUNTER — Other Ambulatory Visit: Payer: Self-pay | Admitting: Pulmonary Disease

## 2020-02-10 MED ORDER — BEVESPI AEROSPHERE 9-4.8 MCG/ACT IN AERO: 2.0000 | INHALATION_SPRAY | Freq: Two times a day (BID) | RESPIRATORY_TRACT | 0 refills | Status: DC

## 2020-03-08 ENCOUNTER — Other Ambulatory Visit: Payer: Self-pay | Admitting: Internal Medicine

## 2020-03-08 DIAGNOSIS — I10 Essential (primary) hypertension: Secondary | ICD-10-CM

## 2020-03-21 ENCOUNTER — Ambulatory Visit (AMBULATORY_SURGERY_CENTER): Payer: Self-pay | Admitting: *Deleted

## 2020-03-21 ENCOUNTER — Other Ambulatory Visit: Payer: Self-pay

## 2020-03-21 VITALS — Ht 65.0 in | Wt 155.8 lb

## 2020-03-21 DIAGNOSIS — Z1211 Encounter for screening for malignant neoplasm of colon: Secondary | ICD-10-CM

## 2020-03-21 MED ORDER — SUPREP BOWEL PREP KIT 17.5-3.13-1.6 GM/177ML PO SOLN: 1.0000 | Freq: Once | ORAL | 0 refills | Status: AC

## 2020-03-21 NOTE — Progress Notes (Signed)
cov vax x 2  No egg or soy allergy known to patient  No issues with past sedation with any surgeries or procedures no intubation problems in the past  No FH of Malignant Hyperthermia No diet pills per patient No home 02 use per patient  No blood thinners per patient  Pt denies issues with constipation  No A fib or A flutter  EMMI video to pt or via [place] 19 guidelines implemented in PV today with Pt and RN   2 day Suprep per JMP     Due to the COVID-19 pandemic we are asking patients to follow these guidelines. Please only bring one care partner. Please be aware that your care partner may wait in the car in the parking lot or if they feel like they will be too hot to wait in the car, they may wait in the lobby on the 4th floor. All care partners are required to wear a mask the entire time (we do not have any that we can provide them), they need to practice social distancing, and we will do a Covid check for all patient's and care partners when you arrive. Also we will check their temperature and your temperature. If the care partner waits in their car they need to stay in the parking lot the entire time and we will call them on their cell phone when the patient is ready for discharge so they can bring the car to the front of the building. Also all patient's will need to wear a mask into building.

## 2020-03-23 ENCOUNTER — Ambulatory Visit: Payer: Self-pay | Admitting: Internal Medicine

## 2020-03-23 NOTE — Telephone Encounter (Signed)
Pt is scheduled for a virtual visit at 1050 with pcp on 8/20

## 2020-03-23 NOTE — Telephone Encounter (Signed)
Pt. Reports she broke out in a rash 3 days ago. Small blisters that start under right breast and go around to her spine. No pain, but is itchy. Blisters are fluid filled. No availability in the practice. Instructed to go to UC. States she is afraid to because of COVID 19. Instructed she could try an e-visit through My Chart.  FS 2  Raven Torres. [place] Female, 64 y.o., 04-15-1956 MRN:  [id number] Phone:  [phone] Raven Torres) PCP:  Ladell Pier, MD Primary Cvg:  [place] Medicare Next Appt With Gastroenterology 04/04/2020 at 9:00 AM Message from Raven Torres sent at 03/23/2020 11:19 AM EDT  Summary: please advise   Pt is calling and needs advice pt has blisters under her breast, back and spine. Pt pop one of blister and fluid came at. Blister has been they for at least 3 days. No openings at [facility]         Call History   Type Contact Phone  03/23/2020 11:23 AM EDT Phone ([address] Raven Torres, Raven Torres (Self) [phone] (H)  User: Elliot Cousin, RN  No Answer/Busy -  "Call cannot be completed at this time."  03/23/2020 11:17 AM EDT Phone ([address]) Raven Torres, Raven Torres (Self) [phone] (H)  User: Raven Torres  Encounter Report  Patient Encounter Report    Reason for Disposition . [1] Looks infected (spreading redness, pus) AND [2] no fever  Answer Assessment - Initial Assessment Questions 1. APPEARANCE of RASH: "Describe the rash."      Looks like blisters 2. LOCATION: "Where is the rash located?"      Right breast and around to back. 3. NUMBER: "How many spots are there?"      Clusters 4. SIZE: "How big are the spots?" (Inches, centimeters or compare to size of a coin)      Eraser 5. ONSET: "When did the rash start?"       3 days ago 6. ITCHING: "Does the rash itch?" If Yes, ask: "How bad is the itch?"  (Scale 1-10; or mild, moderate, severe)     Moderate 7. PAIN: "Does the rash hurt?" If Yes, ask: "How bad is the pain?"  (Scale 1-10; or  mild, moderate, severe)     No 8. OTHER SYMPTOMS: "Do you have any other symptoms?" (e.g., fever)     No 9. PREGNANCY: "Is there any chance you are pregnant?" "When was your last menstrual period?"     No  Protocols used: RASH OR REDNESS - LOCALIZED-A-AH

## 2020-03-24 ENCOUNTER — Encounter: Payer: Self-pay | Admitting: Internal Medicine

## 2020-03-24 ENCOUNTER — Other Ambulatory Visit: Payer: Self-pay

## 2020-03-24 ENCOUNTER — Ambulatory Visit: Payer: Medicare Other | Attending: Internal Medicine | Admitting: Internal Medicine

## 2020-03-24 VITALS — BP 138/71 | HR 68 | Temp 97.9°F | Resp 16 | Wt 160.0 lb

## 2020-03-24 DIAGNOSIS — B029 Zoster without complications: Secondary | ICD-10-CM

## 2020-03-24 MED ORDER — VALACYCLOVIR HCL 1 G PO TABS: 1000.0000 mg | ORAL_TABLET | Freq: Three times a day (TID) | ORAL | 0 refills | Status: DC

## 2020-03-24 MED ORDER — GABAPENTIN 100 MG PO CAPS: 200.0000 mg | ORAL_CAPSULE | Freq: Two times a day (BID) | ORAL | 0 refills | Status: DC

## 2020-03-24 NOTE — Progress Notes (Signed)
Patient ID: Raven Torres, female    DOB: 29-Jul-1956  MRN: [id number]  CC: Rash   Subjective: Raven Torres is a 64 y.o. female who presents for UC visit Her concerns today include:  HTN, cured hepatitis C with cirrhosis, adjustment disorder, HL, tobacco dependence, and rheumatoid arthritis/fibromyalgia(Dr. Posey Pronto at [facility]) , memory decline(MMSE 05/2019 was 30/30), COPD GOLD III, lung nodules (Dr. Vaughan Browner), coronary atherosclerosis.  Patient complains of a painful vesicular rash that started on the midportion of the right posterior thorax and spread around the rib cage below the right breast.  Rash started 3 days ago.  Does not itch.  No initiating factors.  She reports having busted several of the vesicles.  She thinks she had chickenpox as a child.  She has not had the shingles vaccine. Patient Active Problem List   Diagnosis Date Noted  . GOLD COPD III B 04/23/2018  . Pruritic dermatitis 04/23/2018  . High cholesterol 06/24/2017  . Shortness of breath 06/04/2017  . Coronary artery calcification 06/04/2017  . Other fatigue 06/04/2017  . Centrilobular emphysema ([place]) 05/15/2017  . Atherosclerosis of arteries 05/15/2017  . Poor memory 05/15/2017  . TB lung, latent 04/16/2017  . Lung nodules 04/16/2017  . Diabetes mellitus screening 11/19/2016  . Fibromyalgia 05/15/2015  . Rheumatoid arthritis ([place]) 08/22/2014  . HTN (hypertension) 08/22/2014  . Tobacco abuse 08/22/2014  . Hepatic cirrhosis ([place]) 05/31/2014  . Chronic hepatitis C virus infection ([place]) 05/03/2014     Current Outpatient Medications on File Prior to Visit  Medication Sig Dispense Refill  . albuterol (VENTOLIN HFA) 108 (90 Base) MCG/ACT inhaler INHALE 2 PUFFS INTO THE LUNGS EVERY 6 HOURS AS NEEDED FOR WHEEZING OR SHORTNESS OF BREATH 8.5 g 2  . aspirin EC 81 MG tablet Take 1 tablet (81 mg total) by mouth daily. 30 tablet 0  . atorvastatin (LIPITOR) 40 MG tablet Take 1 tablet (40 mg total) by mouth daily at 6  PM. NEED OV. 30 tablet 6  . buPROPion (WELLBUTRIN) 100 MG tablet Take 1 tablet (100 mg total) by mouth 2 (two) times daily. 60 tablet 6  . CALCIUM CARBONATE-VIT D-MIN PO Take 1,200 mg by mouth.    . Cholecalciferol (VITAMIN D3) 1000 units CAPS Take by mouth.    . diclofenac sodium (VOLTAREN) 1 % GEL Apply 2 g topically 4 (four) times daily. 1 Tube 2  . folic acid (FOLVITE) 1 MG tablet TK 1 T PO  D  3  . Glycopyrrolate-Formoterol (BEVESPI AEROSPHERE) 9-4.8 MCG/ACT AERO Inhale 2 puffs into the lungs 2 (two) times daily. 10.7 g 0  . hydroxychloroquine (PLAQUENIL) 200 MG tablet Take 200 mg by mouth 2 (two) times daily.    Marland Kitchen lisinopril-hydrochlorothiazide (ZESTORETIC) 10-12.5 MG tablet TAKE 1 TABLET BY MOUTH DAILY 90 tablet 1  . sulfaSALAzine (AZULFIDINE) 500 MG EC tablet Take by mouth.    . triamcinolone cream (KENALOG) 0.1 % APPLY BID TO AA FOR UP TO TWO WEEKS THEN BID ON THE WEEKENDS ONLY THEREAFTER. DO NOT USE ON FACE OR SKIN FOLDS     No current facility-administered medications on file prior to visit.    Allergies  Allergen Reactions  . Tudorza Pressair [Aclidinium Bromide] Itching  . Amitriptyline Other (See Comments)    Eye spasms per pt  . Codeine Nausea Only  . Tramadol Nausea Only    Social History   Socioeconomic History  . Marital status: Married    Spouse name: Not on file  . Number of  children: Not on file  . Years of education: Not on file  . Highest education level: Not on file  Occupational History  . Not on file  Tobacco Use  . Smoking status: Current Every Day Smoker    Years: 49.00    Types: Cigarettes  . Smokeless tobacco: Never Used  . Tobacco comment: 2 cigs a day   Substance and Sexual Activity  . Alcohol use: No    Alcohol/week: 0.0 standard drinks  . Drug use: No  . Sexual activity: Never  Other Topics Concern  . Not on file  Social History Narrative  . Not on file   Social Determinants of Health   Financial Resource Strain:   . Difficulty of  Paying Living Expenses: Not on file  Food Insecurity:   . Worried About Charity fundraiser in the Last Year: Not on file  . Ran Out of Food in the Last Year: Not on file  Transportation Needs:   . Lack of Transportation (Medical): Not on file  . Lack of Transportation (Non-Medical): Not on file  Physical Activity:   . Days of Exercise per Week: Not on file  . Minutes of Exercise per Session: Not on file  Stress:   . Feeling of Stress : Not on file  Social Connections:   . Frequency of Communication with Friends and Family: Not on file  . Frequency of Social Gatherings with Friends and Family: Not on file  . Attends Religious Services: Not on file  . Active Member of Clubs or Organizations: Not on file  . Attends Archivist Meetings: Not on file  . Marital Status: Not on file  Intimate Partner Violence:   . Fear of Current or Ex-Partner: Not on file  . Emotionally Abused: Not on file  . Physically Abused: Not on file  . Sexually Abused: Not on file    Family History  Problem Relation Age of Onset  . Heart disease Mother   . Cancer Mother   . Melanoma Mother   . Esophageal cancer Father   . Colon cancer Neg Hx   . Colon polyps Neg Hx   . Rectal cancer Neg Hx   . Stomach cancer Neg Hx     Past Surgical History:  Procedure Laterality Date  . ABDOMINAL HYSTERECTOMY    . CATARACT EXTRACTION, BILATERAL    . COLONOSCOPY    . KNEE SURGERY Right   . POLYPECTOMY  11/17/2008   HPP x 1     ROS: Review of Systems Negative except as stated above  PHYSICAL EXAM: BP 138/71   Pulse 68   Temp 97.9 F (36.6 C)   Resp 16   Wt 160 lb (72.6 kg)   SpO2 96%   BMI 26.63 kg/m   Physical Exam  General appearance - alert, well appearing, and in no distress Mental status - normal mood, behavior, speech, dress, motor activity, and thought processes Skin -patient with cluster of slightly scaled over rash in the midportion of the right posterior thorax.  She has another  larger cluster below the right breast.  Few vesicles remaining.       CMP Latest Ref Rng & Units 05/07/2019 11/19/2016 02/26/2016  Glucose 65 - 99 mg/dL 92 103(H) 103(H)  BUN 8 - 27 mg/dL 12 7(L) 8  Creatinine 0.57 - 1.00 mg/dL 0.74 0.81 0.70  Sodium 134 - 144 mmol/L 143 138 137  Potassium 3.5 - 5.2 mmol/L 4.3 4.9 3.9  Chloride 96 -  106 mmol/L 105 98 107  CO2 20 - 29 mmol/L 26 26 24   Calcium 8.7 - 10.3 mg/dL 9.1 10.1 9.1  Total Protein 6.0 - 8.5 g/dL 6.6 - 7.0  Total Bilirubin 0.0 - 1.2 mg/dL 0.4 - 0.6  Alkaline Phos 39 - 117 IU/L 58 - 52  AST 0 - 40 IU/L 35 - 27  ALT 0 - 32 IU/L 18 - 13   Lipid Panel     Component Value Date/Time   CHOL 166 05/07/2019 1058   TRIG 74 05/07/2019 1058   HDL 81 05/07/2019 1058   CHOLHDL 2.0 05/07/2019 1058   CHOLHDL 2.5 02/26/2016 1132   VLDL 28 02/26/2016 1132   LDLCALC 71 05/07/2019 1058    CBC    Component Value Date/Time   WBC 6.0 05/07/2019 1058   WBC 8.0 02/26/2016 1132   RBC 4.60 05/07/2019 1058   RBC 5.19 (H) 02/26/2016 1132   HGB 14.2 05/07/2019 1058   HCT 42.6 05/07/2019 1058   PLT 145 (L) 05/07/2019 1058   MCV 93 05/07/2019 1058   MCH 30.9 05/07/2019 1058   MCH 30.4 02/26/2016 1132   MCHC 33.3 05/07/2019 1058   MCHC 33.8 02/26/2016 1132   RDW 12.8 05/07/2019 1058   LYMPHSABS 2.9 11/19/2016 1223   MONOABS 720 02/26/2016 1132   EOSABS 0.2 11/19/2016 1223   BASOSABS 0.0 11/19/2016 1223    ASSESSMENT AND PLAN: 1. Herpes zoster without complication Patient with shingles that has been present for about 72 hours.  Even though most of the lesions are scabbed, we will still give a course of Valtrex to help shorten the course she is immunosuppressed.  I recommend prescribing gabapentin for a limited course to help decrease the pain associated with the shingles.  Patient is agreeable.  Informed her that the gabapentin can cause some drowsiness.   Once the rash has cleared, she should consider getting the Shingrix vaccine which we  can give on her routine follow-up visit with me in several weeks. - valACYclovir (VALTREX) 1000 MG tablet; Take 1 tablet (1,000 mg total) by mouth 3 (three) times daily.  Dispense: 21 tablet; Refill: 0 - gabapentin (NEURONTIN) 100 MG capsule; Take 2 capsules (200 mg total) by mouth 2 (two) times daily.  Dispense: 56 capsule; Refill: 0    Patient was given the opportunity to ask questions.  Patient verbalized understanding of the plan and was able to repeat key elements of the plan.   No orders of the defined types were placed in this encounter.    Requested Prescriptions   Signed Prescriptions Disp Refills  . valACYclovir (VALTREX) 1000 MG tablet 21 tablet 0    Sig: Take 1 tablet (1,000 mg total) by mouth 3 (three) times daily.  Marland Kitchen gabapentin (NEURONTIN) 100 MG capsule 56 capsule 0    Sig: Take 2 capsules (200 mg total) by mouth 2 (two) times daily.    No follow-ups on file.  Karle Plumber, MD, FACP

## 2020-03-24 NOTE — Patient Instructions (Signed)
I have started you on a medication called valacyclovir to take 3 times a day to help shorten the course of the shingles.  I have also sent a prescription to your pharmacy for medication called gabapentin for you to take twice a day to help decrease the pain from the shingles.  The medication may cause drowsiness.  Shingles  Shingles is an infection. It gives you a painful skin rash and blisters that have fluid in them. Shingles is caused by the same germ (virus) that causes chickenpox. Shingles only happens in people who:  Have had chickenpox.  Have been given a shot of medicine (vaccine) to protect against chickenpox. Shingles is rare in this group. The first symptoms of shingles may be itching, tingling, or pain in an area on your skin. A rash will show on your skin a few days or weeks later. The rash is likely to be on one side of your body. The rash usually has a shape like a belt or a band. Over time, the rash turns into fluid-filled blisters. The blisters will break open, change into scabs, and dry up. Medicines may:  Help with pain and itching.  Help you get better sooner.  Help to prevent long-term problems. Follow these instructions at home: Medicines  Take over-the-counter and prescription medicines only as told by your doctor.  Put on an anti-itch cream or numbing cream where you have a rash, blisters, or scabs. Do this as told by your doctor. Helping with itching and discomfort   Put cold, wet cloths (cold compresses) on the area of the rash or blisters as told by your doctor.  Cool baths can help you feel better. Try adding baking soda or dry oatmeal to the water to lessen itching. Do not bathe in hot water. Blister and rash care  Keep your rash covered with a loose bandage (dressing).  Wear loose clothing that does not rub on your rash.  Keep your rash and blisters clean. To do this, wash the area with mild soap and cool water as told by your doctor.  Check your  rash every day for signs of infection. Check for: ? More redness, swelling, or pain. ? Fluid or blood. ? Warmth. ? Pus or a bad smell.  Do not scratch your rash. Do not pick at your blisters. To help you to not scratch: ? Keep your fingernails clean and cut short. ? Wear gloves or mittens when you sleep, if scratching is a problem. General instructions  Rest as told by your doctor.  Keep all follow-up visits as told by your doctor. This is important.  Wash your hands often with soap and water. If soap and water are not available, use hand sanitizer. Doing this lowers your chance of getting a skin infection caused by germs (bacteria).  Your infection can cause chickenpox in people who have never had chickenpox or never got a shot of chickenpox vaccine. If you have blisters that did not change into scabs yet, try not to touch other people or be around other people, especially: ? Babies. ? Pregnant women. ? Children who have areas of red, itchy, or rough skin (eczema). ? Very old people who have transplants. ? People who have a long-term (chronic) sickness, like cancer or AIDS. Contact a doctor if:  Your pain does not get better with medicine.  Your pain does not get better after the rash heals.  You have any signs of infection in the rash area. These signs include: ?  More redness, swelling, or pain around the rash. ? Fluid or blood coming from the rash. ? The rash area feeling warm to the touch. ? Pus or a bad smell coming from the rash. Get help right away if:  The rash is on your face or nose.  You have pain in your face or pain by your eye.  You lose feeling on one side of your face.  You have trouble seeing.  You have ear pain, or you have ringing in your ear.  You have a loss of taste.  Your condition gets worse. Summary  Shingles gives you a painful skin rash and blisters that have fluid in them.  Shingles is an infection. It is caused by the same germ (virus)  that causes chickenpox.  Keep your rash covered with a loose bandage (dressing). Wear loose clothing that does not rub on your rash.  If you have blisters that did not change into scabs yet, try not to touch other people or be around people. This information is not intended to replace advice given to you by your health care provider. Make sure you discuss any questions you have with your health care provider. Document Revised: 11/13/2018 Document Reviewed: 03/26/2017 Elsevier Patient Education  2020 [employer].

## 2020-03-27 ENCOUNTER — Other Ambulatory Visit: Payer: Self-pay | Admitting: Acute Care

## 2020-03-27 DIAGNOSIS — B029 Zoster without complications: Secondary | ICD-10-CM

## 2020-03-27 HISTORY — DX: Zoster without complications: B02.9

## 2020-04-04 ENCOUNTER — Ambulatory Visit (AMBULATORY_SURGERY_CENTER): Payer: Medicare Other | Admitting: Internal Medicine

## 2020-04-04 ENCOUNTER — Encounter: Payer: Self-pay | Admitting: Internal Medicine

## 2020-04-04 ENCOUNTER — Other Ambulatory Visit: Payer: Self-pay

## 2020-04-04 VITALS — BP 122/57 | HR 69 | Temp 96.0°F | Resp 13 | Ht 65.0 in | Wt 155.0 lb

## 2020-04-04 DIAGNOSIS — Z1211 Encounter for screening for malignant neoplasm of colon: Secondary | ICD-10-CM

## 2020-04-04 HISTORY — PX: COLONOSCOPY: SHX174

## 2020-04-04 MED ORDER — SODIUM CHLORIDE 0.9 % IV SOLN: 500.0000 mL | Freq: Once | INTRAVENOUS | Status: DC

## 2020-04-04 NOTE — Progress Notes (Signed)
Pt's states no medical or surgical changes since previsit or office visit except shingles on 03/27/20, pt states all are scabbed, no weeping.  Vitals SF

## 2020-04-04 NOTE — Op Note (Signed)
[facility] Patient Name: Raven Torres Procedure Date: 04/04/2020 9:16 AM MRN: [id number] Endoscopist: Jerene Bears , MD Age: 64 Referring MD:  Date of Birth: Jul 06, 1956 Gender: Female Account #: [id number] Procedure:                Colonoscopy Indications:              Screening for colorectal malignant neoplasm, Last                            colonoscopy: April 2010 Medicines:                Monitored Anesthesia Care Procedure:                Pre-Anesthesia Assessment:                           - Prior to the procedure, a History and Physical                            was performed, and patient medications and                            allergies were reviewed. The patient's tolerance of                            previous anesthesia was also reviewed. The risks                            and benefits of the procedure and the sedation                            options and risks were discussed with the patient.                            All questions were answered, and informed consent                            was obtained. Prior Anticoagulants: The patient has                            taken no previous anticoagulant or antiplatelet                            agents. ASA Grade Assessment: III - A patient with                            severe systemic disease. After reviewing the risks                            and benefits, the patient was deemed in                            satisfactory condition to undergo the procedure.  After obtaining informed consent, the colonoscope                            was passed under direct vision. Throughout the                            procedure, the patient's blood pressure, pulse, and                            oxygen saturations were monitored continuously. The                            Colonoscope was introduced through the anus and                            advanced to the cecum, identified by  appendiceal                            orifice and ileocecal valve. The colonoscopy was                            performed without difficulty. The patient tolerated                            the procedure well. The quality of the bowel                            preparation was fair despite 2 day prep with                            MiraLax and Suprep. The ileocecal valve,                            appendiceal orifice, and rectum were photographed. Scope In: 9:24:01 AM Scope Out: 9:41:40 AM Scope Withdrawal Time: 0 hours 13 minutes 5 seconds  Total Procedure Duration: 0 hours 17 minutes 39 seconds  Findings:                 The digital rectal exam was normal.                           The entire examined colon appeared normal on direct                            and retroflexion views. Complications:            No immediate complications. Estimated Blood Loss:     Estimated blood loss: none. Impression:               - Preparation of the colon was fair despite 2 day                            bowel preparation. No solid stool, but there were  areas with adherent mucus/stool despite copious                            irrigation and lavage.                           - The entire examined colon is normal on direct and                            retroflexion views.                           - No specimens collected. Recommendation:           - Patient has a contact number available for                            emergencies. The signs and symptoms of potential                            delayed complications were discussed with the                            patient. Return to normal activities tomorrow.                            Written discharge instructions were provided to the                            patient.                           - Resume previous diet.                           - Continue present medications.                           - Repeat  colonoscopy in 5-10 years for screening                            purposes. Jerene Bears, MD 04/04/2020 9:45:13 AM This report has been signed electronically.

## 2020-04-04 NOTE — Patient Instructions (Signed)
Discharge instructions given. Normal exam. Resume previous medications. YOU HAD AN ENDOSCOPIC PROCEDURE TODAY AT THE [facility]:   Refer to the procedure report that was given to you for any specific questions about what was found during the examination.  If the procedure report does not answer your questions, please call your gastroenterologist to clarify.  If you requested that your care partner not be given the details of your procedure findings, then the procedure report has been included in a sealed envelope for you to review at your convenience later.  YOU SHOULD EXPECT: Some feelings of bloating in the abdomen. Passage of more gas than usual.  Walking can help get rid of the air that was put into your GI tract during the procedure and reduce the bloating. If you had a lower endoscopy (such as a colonoscopy or flexible sigmoidoscopy) you may notice spotting of blood in your stool or on the toilet paper. If you underwent a bowel prep for your procedure, you may not have a normal bowel movement for a few days.  Please Note:  You might notice some irritation and congestion in your nose or some drainage.  This is from the oxygen used during your procedure.  There is no need for concern and it should clear up in a day or so.  SYMPTOMS TO REPORT IMMEDIATELY:  Following lower endoscopy (colonoscopy or flexible sigmoidoscopy):  Excessive amounts of blood in the stool  Significant tenderness or worsening of abdominal pains  Swelling of the abdomen that is new, acute  Fever of 100F or higher   For urgent or emergent issues, a gastroenterologist can be reached at any hour by calling [phone]. Do not use MyChart messaging for urgent concerns.    DIET:  We do recommend a small meal at first, but then you may proceed to your regular diet.  Drink plenty of fluids but you should avoid alcoholic beverages for 24 hours.  ACTIVITY:  You should plan to take it easy for the rest of  today and you should NOT DRIVE or use heavy machinery until tomorrow (because of the sedation medicines used during the test).    FOLLOW UP: Our staff will call the number listed on your records 48-72 hours following your procedure to check on you and address any questions or concerns that you may have regarding the information given to you following your procedure. If we do not reach you, we will leave a message.  We will attempt to reach you two times.  During this call, we will ask if you have developed any symptoms of COVID 19. If you develop any symptoms (ie: fever, flu-like symptoms, shortness of breath, cough etc.) before then, please call [phone].  If you test positive for Covid 19 in the 2 weeks post procedure, please call and report this information to us.    If any biopsies were taken you will be contacted by phone or by letter within the next 1-3 weeks.  Please call us at [phone] if you have not heard about the biopsies in 3 weeks.    SIGNATURES/CONFIDENTIALITY: You and/or your care partner have signed paperwork which will be entered into your electronic medical record.  These signatures attest to the fact that that the information above on your After Visit Summary has been reviewed and is understood.  Full responsibility of the confidentiality of this discharge information lies with you and/or your care-partner.  

## 2020-04-04 NOTE — Progress Notes (Signed)
A and O x3. Report to RN. Tolerated MAC anesthesia well.

## 2020-04-06 ENCOUNTER — Ambulatory Visit
Admission: RE | Admit: 2020-04-06 | Discharge: 2020-04-06 | Disposition: A | Discharge status: Home / Self Care | Payer: Medicare Other | Source: Ambulatory Visit | Attending: Internal Medicine | Admitting: Internal Medicine

## 2020-04-06 ENCOUNTER — Other Ambulatory Visit: Payer: Self-pay

## 2020-04-06 ENCOUNTER — Telehealth: Payer: Self-pay

## 2020-04-06 DIAGNOSIS — E2839 Other primary ovarian failure: Secondary | ICD-10-CM

## 2020-04-06 NOTE — Telephone Encounter (Signed)
  Follow up Call-  Call back number 04/04/2020  Post procedure Call Back phone  # [phone]  Permission to leave phone message Yes  Some recent data might be hidden     Patient questions:  Do you have a fever, pain , or abdominal swelling? No. Pain Score  0 *  Have you tolerated food without any problems? Yes.    Have you been able to return to your normal activities? Yes.    Do you have any questions about your discharge instructions: Diet   No. Medications  No. Follow up visit  No.  Do you have questions or concerns about your Care? No.  Actions: * If pain score is 4 or above: No action needed, pain <4.  1. Have you developed a fever since your procedure?no  2.   Have you had an respiratory symptoms (SOB or cough) since your procedure? no  3.   Have you tested positive for COVID 19 since your procedure no  4.   Have you had any family members/close contacts diagnosed with the COVID 19 since your procedure?  no   If yes to any of these questions please route to Joylene John, RN and Joella Prince, RN

## 2020-04-07 ENCOUNTER — Other Ambulatory Visit: Payer: Self-pay | Admitting: Internal Medicine

## 2020-04-07 ENCOUNTER — Other Ambulatory Visit: Payer: Self-pay | Admitting: Pulmonary Disease

## 2020-04-07 DIAGNOSIS — I7090 Unspecified atherosclerosis: Secondary | ICD-10-CM

## 2020-04-07 NOTE — Telephone Encounter (Signed)
Refill request for Bevespi  Last OV: 02/03/2019  Next OV: Nothing scheduled  Last ordered by : Dr Vaughan Browner Quantity: 10.7 g Instructions: Inhale 2 puffs into the lungs 2 (two) times daily  Dr Vaughan Browner please advise  Allergies  Allergen Reactions  . Tudorza Pressair [Aclidinium Bromide] Itching  . Amitriptyline Other (See Comments)    Eye spasms per pt  . Codeine Nausea Only  . Tramadol Nausea Only    Current Outpatient Medications on File Prior to Visit  Medication Sig Dispense Refill  . albuterol (VENTOLIN HFA) 108 (90 Base) MCG/ACT inhaler INHALE 2 PUFFS INTO THE LUNGS EVERY 6 HOURS AS NEEDED FOR WHEEZING OR SHORTNESS OF BREATH 8.5 g 3  . aspirin EC 81 MG tablet Take 1 tablet (81 mg total) by mouth daily. 30 tablet 0  . atorvastatin (LIPITOR) 40 MG tablet TAKE 1 TABLET(40 MG) BY MOUTH DAILY AT 6 PM 30 tablet 2  . buPROPion (WELLBUTRIN) 100 MG tablet Take 1 tablet (100 mg total) by mouth 2 (two) times daily. 60 tablet 6  . CALCIUM CARBONATE-VIT D-MIN PO Take 1,200 mg by mouth.    . Cholecalciferol (VITAMIN D3) 1000 units CAPS Take by mouth.    . diclofenac sodium (VOLTAREN) 1 % GEL Apply 2 g topically 4 (four) times daily. (Patient not taking: Reported on 6/65/9935) 1 Tube 2  . folic acid (FOLVITE) 1 MG tablet TK 1 T PO  D  3  . gabapentin (NEURONTIN) 100 MG capsule Take 2 capsules (200 mg total) by mouth 2 (two) times daily. 56 capsule 0  . Glycopyrrolate-Formoterol (BEVESPI AEROSPHERE) 9-4.8 MCG/ACT AERO Inhale 2 puffs into the lungs 2 (two) times daily. 10.7 g 0  . hydroxychloroquine (PLAQUENIL) 200 MG tablet Take 200 mg by mouth 2 (two) times daily.    Marland Kitchen lisinopril-hydrochlorothiazide (ZESTORETIC) 10-12.5 MG tablet TAKE 1 TABLET BY MOUTH DAILY 90 tablet 1  . sulfaSALAzine (AZULFIDINE) 500 MG EC tablet Take by mouth.    . triamcinolone cream (KENALOG) 0.1 % APPLY BID TO AA FOR UP TO TWO WEEKS THEN BID ON THE WEEKENDS ONLY THEREAFTER. DO NOT USE ON FACE OR SKIN FOLDS    .  valACYclovir (VALTREX) 1000 MG tablet Take 1 tablet (1,000 mg total) by mouth 3 (three) times daily. 21 tablet 0   No current facility-administered medications on file prior to visit.

## 2020-04-12 ENCOUNTER — Other Ambulatory Visit: Payer: Self-pay | Admitting: Pulmonary Disease

## 2020-04-28 ENCOUNTER — Other Ambulatory Visit: Payer: Medicare Other

## 2020-04-28 DIAGNOSIS — Z20822 Contact with and (suspected) exposure to covid-19: Secondary | ICD-10-CM

## 2020-04-30 LAB — NOVEL CORONAVIRUS, NAA: SARS-CoV-2, NAA: NOT DETECTED

## 2020-04-30 LAB — SARS-COV-2, NAA 2 DAY TAT

## 2020-05-11 ENCOUNTER — Other Ambulatory Visit: Payer: Self-pay

## 2020-05-11 ENCOUNTER — Ambulatory Visit: Payer: Medicare Other | Attending: Internal Medicine | Admitting: Internal Medicine

## 2020-05-11 DIAGNOSIS — J441 Chronic obstructive pulmonary disease with (acute) exacerbation: Secondary | ICD-10-CM | POA: Diagnosis not present

## 2020-05-11 DIAGNOSIS — I10 Essential (primary) hypertension: Secondary | ICD-10-CM

## 2020-05-11 DIAGNOSIS — I2510 Atherosclerotic heart disease of native coronary artery without angina pectoris: Secondary | ICD-10-CM

## 2020-05-11 DIAGNOSIS — I2583 Coronary atherosclerosis due to lipid rich plaque: Secondary | ICD-10-CM

## 2020-05-11 DIAGNOSIS — R634 Abnormal weight loss: Secondary | ICD-10-CM

## 2020-05-11 DIAGNOSIS — M0579 Rheumatoid arthritis with rheumatoid factor of multiple sites without organ or systems involvement: Secondary | ICD-10-CM

## 2020-05-11 DIAGNOSIS — Z23 Encounter for immunization: Secondary | ICD-10-CM

## 2020-05-11 DIAGNOSIS — F17200 Nicotine dependence, unspecified, uncomplicated: Secondary | ICD-10-CM | POA: Diagnosis not present

## 2020-05-11 DIAGNOSIS — R918 Other nonspecific abnormal finding of lung field: Secondary | ICD-10-CM | POA: Diagnosis not present

## 2020-05-11 DIAGNOSIS — R413 Other amnesia: Secondary | ICD-10-CM

## 2020-05-11 MED ORDER — ALBUTEROL SULFATE (2.5 MG/3ML) 0.083% IN NEBU: 2.5000 mg | INHALATION_SOLUTION | Freq: Four times a day (QID) | RESPIRATORY_TRACT | 1 refills | Status: DC | PRN

## 2020-05-11 MED ORDER — AZITHROMYCIN 250 MG PO TABS: ORAL_TABLET | ORAL | 0 refills | Status: DC

## 2020-05-11 MED ORDER — PREDNISONE 20 MG PO TABS: 20.0000 mg | ORAL_TABLET | Freq: Every day | ORAL | 0 refills | Status: DC

## 2020-05-11 NOTE — Progress Notes (Signed)
Virtual Visit via Telephone Note Due to current restrictions/limitations of in-office visits due to the COVID-19 pandemic, this scheduled clinical appointment was converted to a telehealth visit  I connected with Caleen Jobs on 05/11/20 at  9:10 AM EDT by telephone and verified that [name] speaking with the correct person using two identifiers.   [name] in my office.  The patient is at home.  Only the patient and myself participated in this encounter.  I discussed the limitations, risks, security and privacy concerns of performing an evaluation and management service by telephone and the availability of in person appointments. I also discussed with the patient that there may be a patient responsible charge related to this service. The patient expressed understanding and agreed to proceed.   History of Present Illness: HTN, cured hepatitis C with cirrhosis, adjustment disorder, HL, tobacco dependence, and rheumatoid arthritis/fibromyalgia(Dr. Posey Pronto at [facility]) , memory decline(MMSE 05/2019 was 30/30), COPD GOLD III, lung nodules (Dr. Vaughan Browner), coronary atherosclerosis.  Last visit 03/2020. Today's visit is for chronic ds management  COPD:  C/o having some increase cough and congestion x 2 wks. Little increase SOB. Cough productive of light green phlegm.  Wants something to help get phlegm up -no fever. -no recent sick contacts. She is vaccinated against COVID.  Had flu shot at [facility] 2-3 wks ago.  She requested that they send me that information but I have not received it. -using Bevespi and Albuterol BID.  No increase use of albuterol. She does not have a neb machine. -still working on trying to quit smoking.  Now smokes 1 cig/day and some days not at all. -saw pulmonary NP 01/2020.  CT scan revealed benign appearance or behavior of nodules.  Again seen as incidental findings were coronary atherosclerosis and liver cirrhosis.  We plan to repeat CAT scan in 1 year.   HTN/coronry atherosclerosis:  no device to check BP.  Reports compliance with lisinopril/hydrochlorothiazide and salt restriction No CP other than when coughing  Wgh Loss: Unintentional.  She reports that her weight is now in the 150s.  Weight on last visit was 162 pounds down 9 pounds from October of last year.  TSH was normal.  Reports decrease appetite.  Eats1-2 small meals a day and snacks on peanut butter crackers when she feels she cannot eat a full meal.  Denies any problems swallowing.  No abdominal pain. No depression symptoms.  She has had her colonoscopy recently and it was normal though prep was fair.  Last ultrasound of the liver for [facility] The screening was February of this year.  Recent CT of the chest for follow-up of lung nodules as described above.  Up-to-date with her mammogram. She continues to struggle with some memory issues.  RA:  Was suppose to see Dr. Posey Pronto last mth but cancel as she was waiting on COVID test results.  She plans to call to reschedule.  HM:  Completed c-scope.  Bone density cancelled day of because she had shingle scabs at the time.  Needs to be rescheduled.  Healed from shingles.  Ready for shingles vaccine. Outpatient Encounter Medications as of 05/11/2020  Medication Sig  . albuterol (VENTOLIN HFA) 108 (90 Base) MCG/ACT inhaler INHALE 2 PUFFS INTO THE LUNGS EVERY 6 HOURS AS NEEDED FOR WHEEZING OR SHORTNESS OF BREATH  . aspirin EC 81 MG tablet Take 1 tablet (81 mg total) by mouth daily.  Marland Kitchen atorvastatin (LIPITOR) 40 MG tablet TAKE 1 TABLET(40 MG) BY MOUTH DAILY AT 6 PM  .  BEVESPI AEROSPHERE 9-4.8 MCG/ACT AERO INHALE 2 PUFFS INTO THE LUNGS TWICE DAILY  . buPROPion (WELLBUTRIN) 100 MG tablet Take 1 tablet (100 mg total) by mouth 2 (two) times daily.  Marland Kitchen CALCIUM CARBONATE-VIT D-MIN PO Take 1,200 mg by mouth.  . Cholecalciferol (VITAMIN D3) 1000 units CAPS Take by mouth.  . diclofenac sodium (VOLTAREN) 1 % GEL Apply 2 g topically 4 (four) times daily. (Patient not taking: Reported on 04/04/2020)  .  folic acid (FOLVITE) 1 MG tablet TK 1 T PO  D  . gabapentin (NEURONTIN) 100 MG capsule Take 2 capsules (200 mg total) by mouth 2 (two) times daily.  . hydroxychloroquine (PLAQUENIL) 200 MG tablet Take 200 mg by mouth 2 (two) times daily.  Marland Kitchen lisinopril-hydrochlorothiazide (ZESTORETIC) 10-12.5 MG tablet TAKE 1 TABLET BY MOUTH DAILY  . sulfaSALAzine (AZULFIDINE) 500 MG EC tablet Take by mouth.  . triamcinolone cream (KENALOG) 0.1 % APPLY BID TO AA FOR UP TO TWO WEEKS THEN BID ON THE WEEKENDS ONLY THEREAFTER. DO NOT USE ON FACE OR SKIN FOLDS  . valACYclovir (VALTREX) 1000 MG tablet Take 1 tablet (1,000 mg total) by mouth 3 (three) times daily.   No facility-administered encounter medications on file as of 05/11/2020.      Observations/Objective: Results for orders placed or performed in visit on 04/28/20  Novel Coronavirus, NAA ([facility])   Specimen: Nasopharyngeal(NP) swabs in vial transport medium   Nasopharynge  Screenin  Result Value Ref Range   SARS-CoV-2, NAA Not Detected Not Detected  SARS-COV-2, NAA 2 DAY TAT   Nasopharynge  Screenin  Result Value Ref Range   SARS-CoV-2, NAA 2 DAY TAT Performed       Chemistry      Component Value Date/Time   NA 143 05/07/2019 1058   K 4.3 05/07/2019 1058   CL 105 05/07/2019 1058   CO2 26 05/07/2019 1058   BUN 12 05/07/2019 1058   CREATININE 0.74 05/07/2019 1058   CREATININE 0.70 02/26/2016 1143      Component Value Date/Time   CALCIUM 9.1 05/07/2019 1058   ALKPHOS 58 05/07/2019 1058   AST 35 05/07/2019 1058   ALT 18 05/07/2019 1058   BILITOT 0.4 05/07/2019 1058     Lab Results  Component Value Date   WBC 6.0 05/07/2019   HGB 14.2 05/07/2019   HCT 42.6 05/07/2019   MCV 93 05/07/2019   PLT 145 (L) 05/07/2019   Lab Results  Component Value Date   CHOL 166 05/07/2019   HDL 81 05/07/2019   LDLCALC 71 05/07/2019   TRIG 74 05/07/2019   CHOLHDL 2.0 05/07/2019    Assessment and Plan: 1. COPD exacerbation ([place]) We will give a  short course of prednisone with antibiotics.  She can purchase Mucinex over-the-counter and use as needed to help break up the phlegm. -I explained to her what her nebulizer machine is and how it can be used during acute episodes.  She is agreeable to getting a nebulizer machine.  I have submitted prescription for solutions to go with the nebulizer. - azithromycin (ZITHROMAX Z-PAK) 250 MG tablet; 2 tabs PO x 1 then 1 tab daily  Dispense: 6 each; Refill: 0 - predniSONE (DELTASONE) 20 MG tablet; Take 1 tablet (20 mg total) by mouth daily with breakfast.  Dispense: 5 tablet; Refill: 0 - albuterol (PROVENTIL) (2.5 MG/3ML) 0.083% nebulizer solution; Take 3 mLs (2.5 mg total) by nebulization every 6 (six) hours as needed for wheezing or shortness of breath.  Dispense: 150 mL;  Refill: 1  2. Lung nodules Followed by pulmonary.  3. Tobacco dependence Continue to encourage her to quit.  I have commended her on how she has cut back.  Encouraged her to set a quit date.  Less than 5 minutes spent on counseling.  4. Essential hypertension Continue current medication and low-salt diet. - CBC; Future - Comprehensive metabolic panel; Future  5. Weight loss, unintentional Of questionable etiology.  Patient thinks it may be due to some of her medications.  She is up-to-date with age-appropriate cancer screenings.  She denies any significant depression.  I recommend purchasing some boost or Ensure shakes and drinking 1 to supplement meals.  6. Rheumatoid arthritis involving multiple sites with positive rheumatoid factor ([place]) Encourage her to call Dr. Serita Grit office to reschedule her follow-up appointment.  It is important that she follows up regularly with her being on DMARDs  7. Poor memory Continues to struggle with this.  I have noted that we do not have a B12 level in the chart.  She is agreeable to coming to the lab to having her routine blood test done and we will do a B12 level also.  We will repeat  Mini-Mental status exam on next in-person visit. - Vitamin B12; Future  8. Need for shingles vaccine We will schedule her for Shingrix 2 to 3 weeks out from today's visit to give her time to recover from her current COPD exacerbation and to complete steroid.  9. Coronary atherosclerosis due to lipid rich plaque Continue Lipitor and aspirin.  Smoking cessation advised. - Lipid panel; Future   Follow Up Instructions: 3 mths in person   I discussed the assessment and treatment plan with the patient. The patient was provided an opportunity to ask questions and all were answered. The patient agreed with the plan and demonstrated an understanding of the instructions.   The patient was advised to call back or seek an in-person evaluation if the symptoms worsen or if the condition fails to improve as anticipated.  I provided 25 minutes of non-face-to-face time during this encounter.   Karle Plumber, MD

## 2020-05-25 ENCOUNTER — Telehealth: Payer: Self-pay

## 2020-05-25 NOTE — Telephone Encounter (Signed)
Contacted pt and left cm making pt aware that I have a nebulizer machine in the office for her and she can come by the office to pick it up and if she has any questions or concerns to give [place] a call

## 2020-05-26 ENCOUNTER — Telehealth: Payer: Self-pay | Admitting: Pulmonary Disease

## 2020-05-26 NOTE — Telephone Encounter (Signed)
PA request was received from (pharmacy): [facility] Phone:[phone] Fax: [phone] Medication name and strength: Bevespi 9-4.45mcg Ordering Provider: Vaughan Browner  Was PA started with CMM?: yes If yes, please enter KEY: [id number]  Medication tried and failed:Tuderza, stiolto, spirivia Covered Alternatives: na  PA sent to plan, time frame for approval / denial: 24-72 Routing to  [place] for follow-up

## 2020-05-27 ENCOUNTER — Other Ambulatory Visit: Payer: Self-pay | Admitting: Internal Medicine

## 2020-05-27 DIAGNOSIS — I7090 Unspecified atherosclerosis: Secondary | ICD-10-CM

## 2020-05-27 DIAGNOSIS — I708 Atherosclerosis of other arteries: Secondary | ICD-10-CM

## 2020-05-27 NOTE — Telephone Encounter (Signed)
Requested medication (s) are due for refill today: no  Requested medication (s) are on the active medication list: yes  Last refill:  04/07/20  Future visit scheduled: yes  Notes to clinic:  labs overdue   Requested Prescriptions  Pending Prescriptions Disp Refills   atorvastatin (LIPITOR) 40 MG tablet [Pharmacy Med Name: ATORVASTATIN 40MG  TABLETS] 30 tablet 2    Sig: TAKE 1 TABLET(40 MG) BY MOUTH DAILY AT 6 PM      Cardiovascular:  Antilipid - Statins Failed - 05/27/2020  3:28 PM      Failed - Total Cholesterol in normal range and within 360 days    Cholesterol, Total  Date Value Ref Range Status  05/07/2019 166 100 - 199 mg/dL Final          Failed - LDL in normal range and within 360 days    LDL Chol Calc (NIH)  Date Value Ref Range Status  05/07/2019 71 0 - 99 mg/dL Final          Failed - HDL in normal range and within 360 days    HDL  Date Value Ref Range Status  05/07/2019 81 >39 mg/dL Final          Failed - Triglycerides in normal range and within 360 days    Triglycerides  Date Value Ref Range Status  05/07/2019 74 0 - 149 mg/dL Final          Passed - Patient is not pregnant      Passed - Valid encounter within last 12 months    Recent Outpatient Visits           2 weeks ago COPD exacerbation ([place])   [place], Deborah B, MD   2 months ago Herpes zoster without complication   [place], Deborah B, MD   4 months ago Essential hypertension   [place], Deborah B, MD   8 months ago GOLD COPD III B   [place], Deborah B, MD   1 year ago Need for influenza vaccination   [place], Jarome Matin, RPH-CPP       Future Appointments             In 6 days Daisy Blossom, Jarome Matin, [place]   In 2 months Wynetta Emery,  Dalbert Batman, MD [place]

## 2020-06-01 NOTE — Telephone Encounter (Signed)
Checked CMM. PA is not required for Patient to receive Bevespi at this time.

## 2020-06-02 ENCOUNTER — Other Ambulatory Visit: Payer: Self-pay

## 2020-06-02 ENCOUNTER — Ambulatory Visit: Payer: Medicare Other | Attending: Internal Medicine | Admitting: Pharmacist

## 2020-06-02 DIAGNOSIS — R413 Other amnesia: Secondary | ICD-10-CM

## 2020-06-02 DIAGNOSIS — I2510 Atherosclerotic heart disease of native coronary artery without angina pectoris: Secondary | ICD-10-CM

## 2020-06-02 DIAGNOSIS — Z23 Encounter for immunization: Secondary | ICD-10-CM

## 2020-06-02 DIAGNOSIS — I10 Essential (primary) hypertension: Secondary | ICD-10-CM

## 2020-06-02 DIAGNOSIS — I2583 Coronary atherosclerosis due to lipid rich plaque: Secondary | ICD-10-CM

## 2020-06-02 NOTE — Progress Notes (Signed)
Patient presents for vaccination against Shingrix per orders of Dr. Wynetta Emery. Consent given. Counseling provided. No contraindications exists. Vaccine administered without incident.   Raven Torres, PharmD, [place] [phone]

## 2020-06-03 LAB — COMPREHENSIVE METABOLIC PANEL
ALT: 15 IU/L (ref 0–32)
AST: 30 IU/L (ref 0–40)
Albumin/Globulin Ratio: 1.1 — ABNORMAL LOW (ref 1.2–2.2)
Albumin: 3.5 g/dL — ABNORMAL LOW (ref 3.8–4.8)
Alkaline Phosphatase: 59 IU/L (ref 44–121)
BUN/Creatinine Ratio: 13 (ref 12–28)
BUN: 12 mg/dL (ref 8–27)
Bilirubin Total: 0.5 mg/dL (ref 0.0–1.2)
CO2: 23 mmol/L (ref 20–29)
Calcium: 9.5 mg/dL (ref 8.7–10.3)
Chloride: 101 mmol/L (ref 96–106)
Creatinine, Ser: 0.93 mg/dL (ref 0.57–1.00)
GFR calc Af Amer: 74 mL/min/{1.73_m2} (ref >59)
GFR calc non Af Amer: 64 mL/min/{1.73_m2} (ref >59)
Globulin, Total: 3.1 g/dL (ref 1.5–4.5)
Glucose: 83 mg/dL (ref 65–99)
Potassium: 3.9 mmol/L (ref 3.5–5.2)
Sodium: 139 mmol/L (ref 134–144)
Total Protein: 6.6 g/dL (ref 6.0–8.5)

## 2020-06-03 LAB — LIPID PANEL
Chol/HDL Ratio: 2 ratio (ref 0.0–4.4)
Cholesterol, Total: 133 mg/dL (ref 100–199)
HDL: 67 mg/dL (ref >39)
LDL Chol Calc (NIH): 50 mg/dL (ref 0–99)
Triglycerides: 84 mg/dL (ref 0–149)
VLDL Cholesterol Cal: 16 mg/dL (ref 5–40)

## 2020-06-03 LAB — CBC
Hematocrit: 39.7 % (ref 34.0–46.6)
Hemoglobin: 13.8 g/dL (ref 11.1–15.9)
MCH: 31.9 pg (ref 26.6–33.0)
MCHC: 34.8 g/dL (ref 31.5–35.7)
MCV: 92 fL (ref 79–97)
Platelets: 114 10*3/uL — ABNORMAL LOW (ref 150–450)
RBC: 4.32 x10E6/uL (ref 3.77–5.28)
RDW: 13 % (ref 11.7–15.4)
WBC: 6 10*3/uL (ref 3.4–10.8)

## 2020-06-03 LAB — VITAMIN B12: Vitamin B-12: 322 pg/mL (ref 232–1245)

## 2020-06-05 ENCOUNTER — Other Ambulatory Visit: Payer: Self-pay | Admitting: Internal Medicine

## 2020-06-05 DIAGNOSIS — F17200 Nicotine dependence, unspecified, uncomplicated: Secondary | ICD-10-CM

## 2020-06-08 ENCOUNTER — Ambulatory Visit: Payer: Medicare Other | Attending: Family Medicine | Admitting: Family Medicine

## 2020-06-08 ENCOUNTER — Other Ambulatory Visit: Payer: Self-pay

## 2020-06-08 ENCOUNTER — Encounter: Payer: Self-pay | Admitting: Family Medicine

## 2020-06-08 VITALS — BP 134/69 | HR 80 | Ht 65.0 in | Wt 150.4 lb

## 2020-06-08 DIAGNOSIS — R221 Localized swelling, mass and lump, neck: Secondary | ICD-10-CM | POA: Diagnosis not present

## 2020-06-08 DIAGNOSIS — R634 Abnormal weight loss: Secondary | ICD-10-CM

## 2020-06-08 IMAGING — US US ABDOMEN LIMITED
1 series · 14 of 25 positions shown · non-contrast
Comparison: Ultrasound January 06, 2017.

CLINICAL DATA: Hepatitis-C.

EXAM:
ULTRASOUND ABDOMEN LIMITED RIGHT UPPER QUADRANT

[Series 1: us abdomen limited · 0.15mm/px · 14 of 34 slices shown]
[im 1/34]
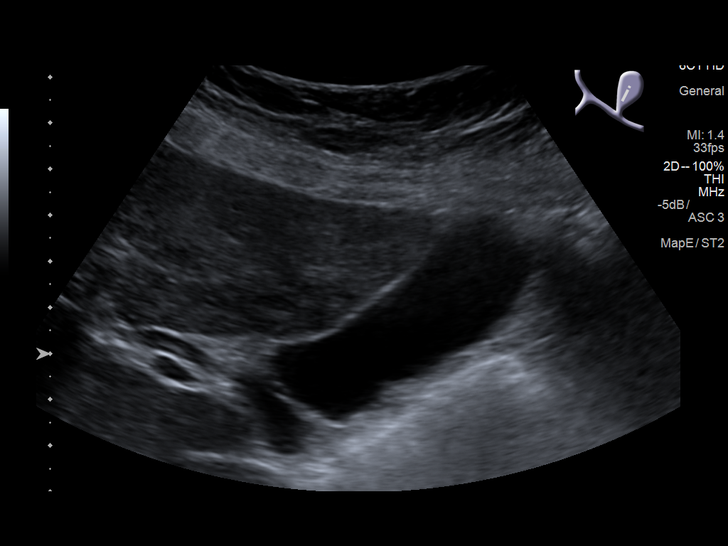
[im 3/34]
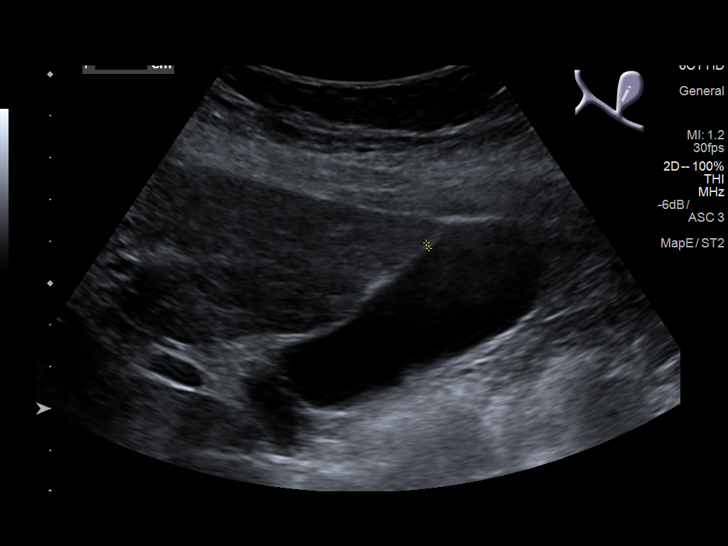
[im 6/34]
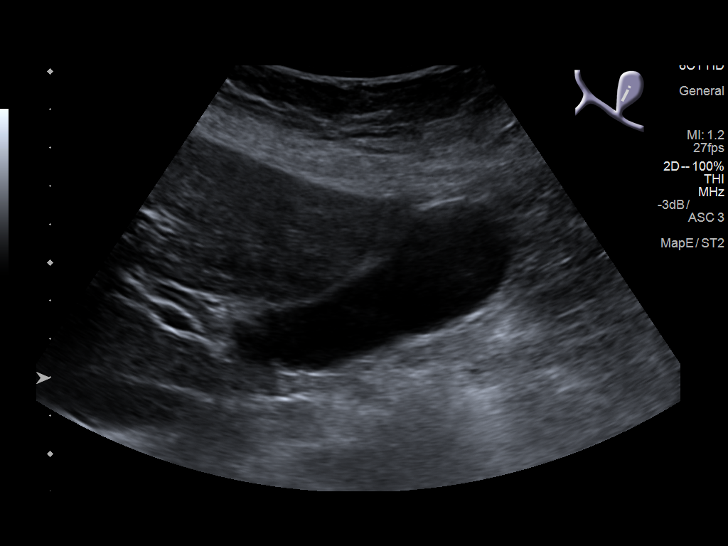
[im 9/34]
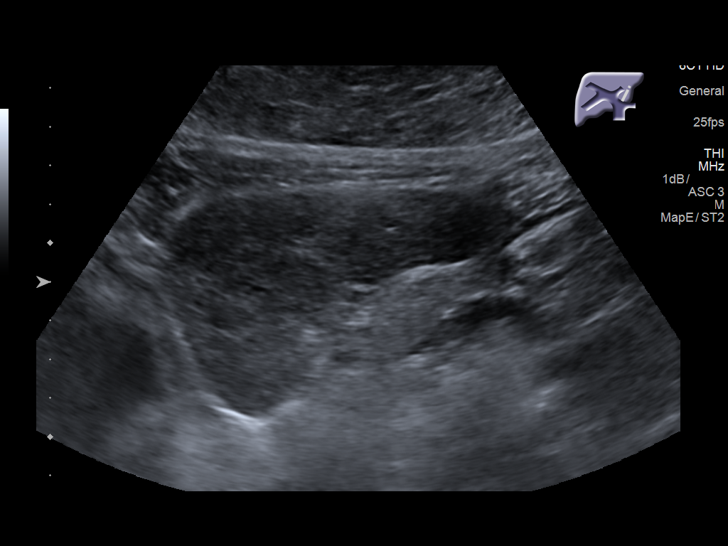
[im 12/34]
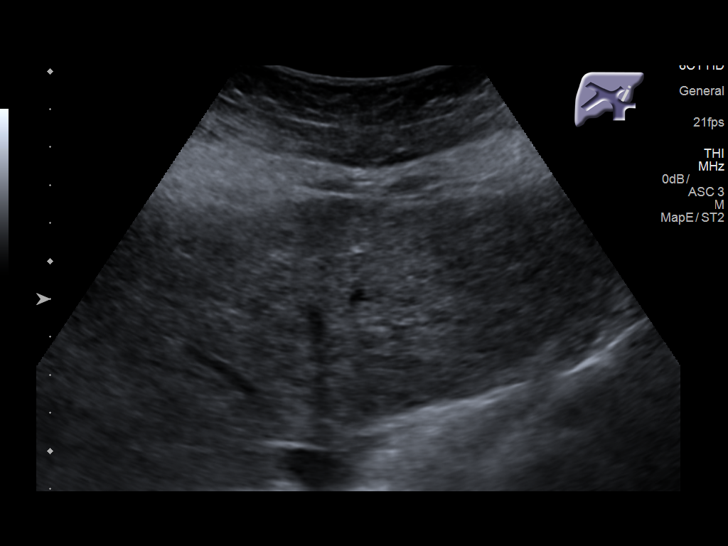
[im 13/34]
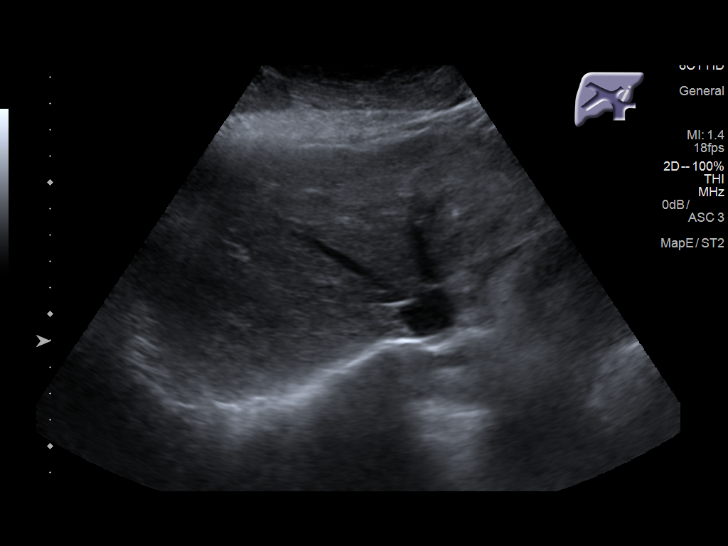
[im 16/34]
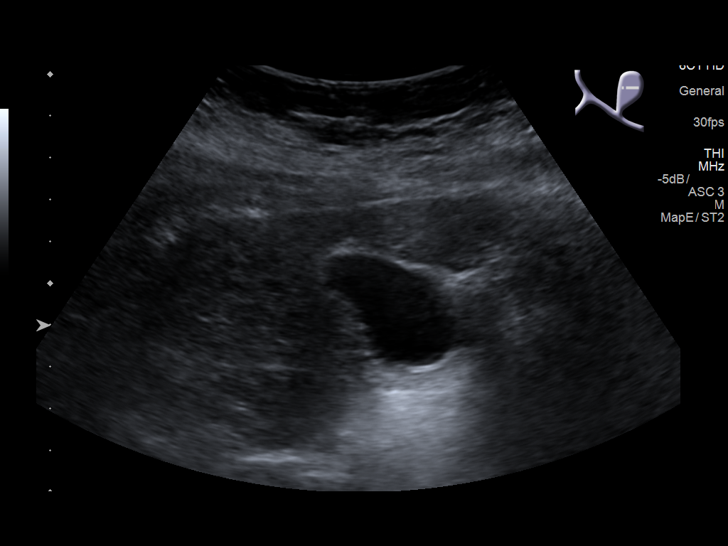
[im 18/34]
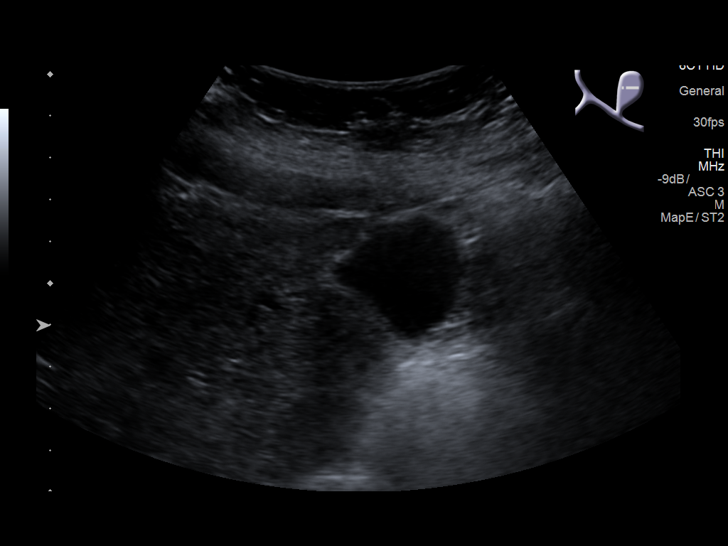
[im 21/34]
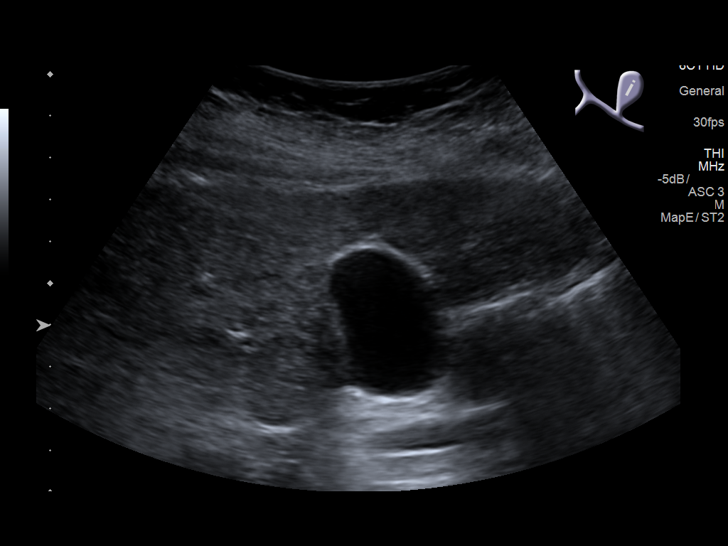
[im 23/34]
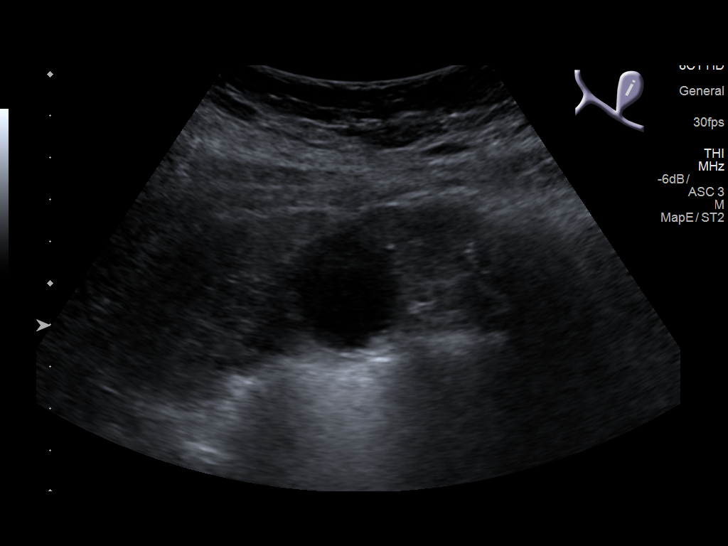
[im 25/34]
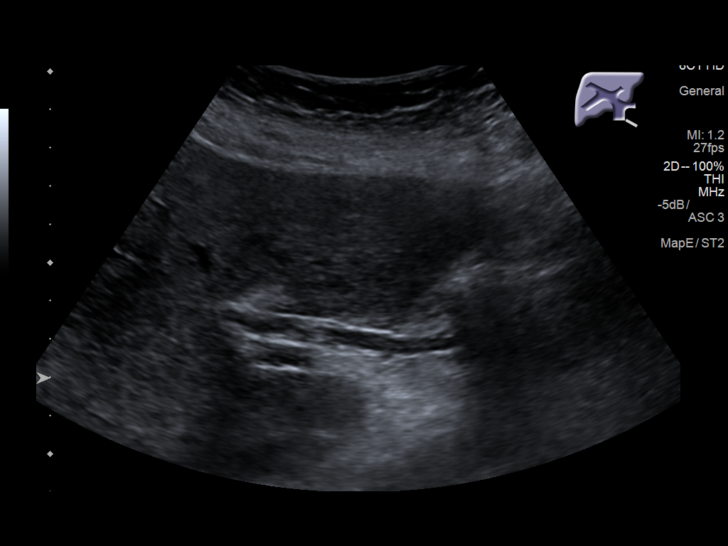
[im 28/34]
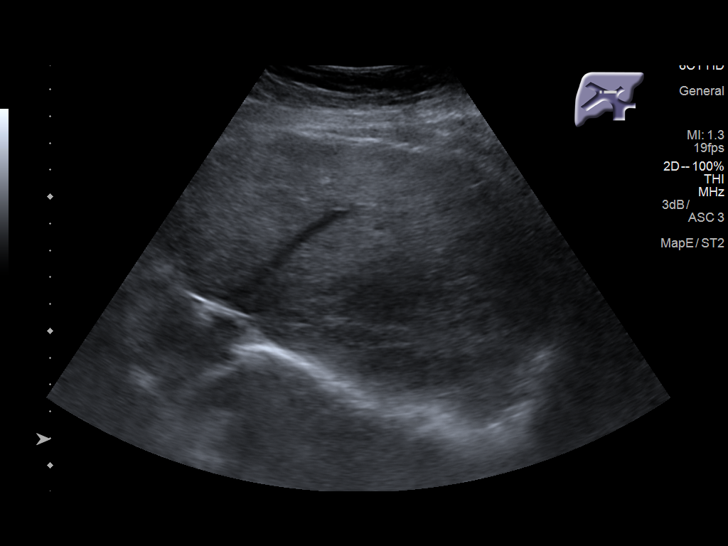
[im 31/34]
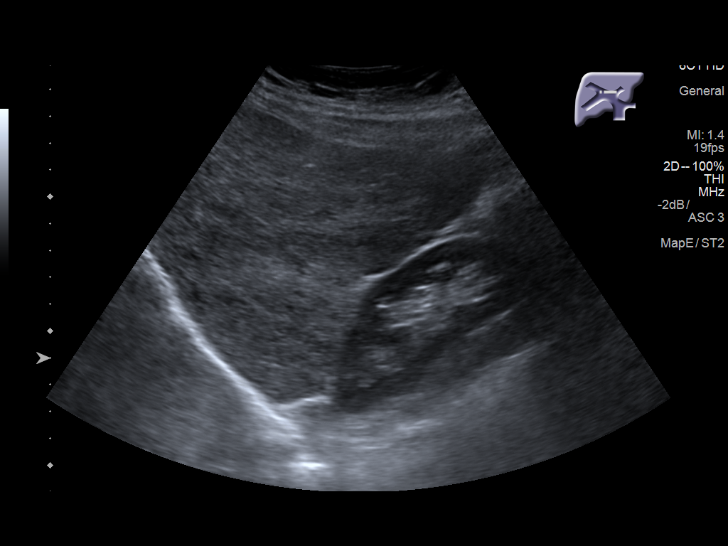
[im 34/34]
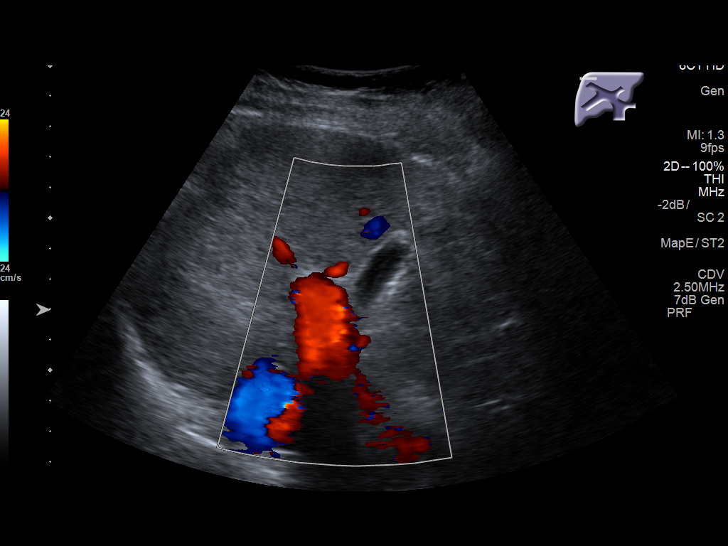

[14 of 25 positions shown; findings below may reference images not displayed]

FINDINGS: Gallbladder:

No gallstones or wall thickening visualized. No sonographic Murphy
sign noted by sonographer.

Common bile duct:

Diameter: 4 mm which is within normal limits.

Liver:

No focal abnormality is noted. Heterogeneous echotexture is noted
with nodular hepatic contours consistent with hepatic cirrhosis.
Portal vein is patent on color Doppler imaging with normal direction
of blood flow towards the liver.
IMPRESSION: Findings consistent with hepatic cirrhosis. No definite sonographic
focal abnormality is noted.

## 2020-06-08 NOTE — Progress Notes (Signed)
States that her throat is swollen.  Weight loss concern.

## 2020-06-08 NOTE — Progress Notes (Signed)
Subjective:  Patient ID: Raven Torres, female    DOB: 1956/05/21  Age: 64 y.o. MRN: [id number]  CC: Neck Pain   HPI Raven Torres is a 64 year old female patient of Dr Wynetta Emery with a medical history of hyperlipidemia, hepatitis C (completed treatment),Liver cirrhosis, rheumatoid arthritis, fibromyalgia, COPD seen today for an acute visit accompanied by her daughter.  She complains about weight loss (vitals reveal she has lost 10 pounds in the last 3 months). She noticed this over the last 3 months and her appetite has changed. Daughter states she has had nausea but no abdominal pain or other GI symptoms. TSH was normal in 01/2020. Low-dose CT for lung cancer screening was negative for malignancy in 01/2020 Screening mammogram was negative for malignancy in 07/2019 Colonoscopy was negative for malignancy in 03/2020. Also noticed right neck swelling over the last 2 days which has progressively increased in size with no associated voice hoarseness, no fever but she does have chronic cough with mucus production typical of her COPD.  Past Medical History:  Diagnosis Date  . Arthritis   . Cataract    removed both eyes   . Colitis   . COPD (chronic obstructive pulmonary disease) ([place])   . Hepatitis C   . Hyperlipidemia   . Hypertension   . Shingles 03/27/2020  . TB (tuberculosis)    treatment     Past Surgical History:  Procedure Laterality Date  . ABDOMINAL HYSTERECTOMY    . CATARACT EXTRACTION, BILATERAL    . COLONOSCOPY    . COLONOSCOPY  04/04/2020  . KNEE SURGERY Right   . POLYPECTOMY  11/17/2008   HPP x 1     Family History  Problem Relation Age of Onset  . Heart disease Mother   . Cancer Mother   . Melanoma Mother   . Esophageal cancer Father   . Colon cancer Neg Hx   . Colon polyps Neg Hx   . Rectal cancer Neg Hx   . Stomach cancer Neg Hx     Allergies  Allergen Reactions  . Tudorza Pressair [Aclidinium Bromide] Itching  . Amitriptyline Other (See  Comments)    Eye spasms per pt  . Codeine Nausea Only  . Tramadol Nausea Only    Outpatient Medications Prior to Visit  Medication Sig Dispense Refill  . albuterol (PROVENTIL) (2.5 MG/3ML) 0.083% nebulizer solution Take 3 mLs (2.5 mg total) by nebulization every 6 (six) hours as needed for wheezing or shortness of breath. 150 mL 1  . albuterol (VENTOLIN HFA) 108 (90 Base) MCG/ACT inhaler INHALE 2 PUFFS INTO THE LUNGS EVERY 6 HOURS AS NEEDED FOR WHEEZING OR SHORTNESS OF BREATH 8.5 g 3  . aspirin EC 81 MG tablet Take 1 tablet (81 mg total) by mouth daily. 30 tablet 0  . atorvastatin (LIPITOR) 40 MG tablet TAKE 1 TABLET(40 MG) BY MOUTH DAILY AT 6 PM 30 tablet 2  . azithromycin (ZITHROMAX Z-PAK) 250 MG tablet 2 tabs PO x 1 then 1 tab daily 6 each 0  . BEVESPI AEROSPHERE 9-4.8 MCG/ACT AERO INHALE 2 PUFFS INTO THE LUNGS TWICE DAILY 10.7 g 1  . buPROPion (WELLBUTRIN) 100 MG tablet TAKE 1 TABLET(100 MG) BY MOUTH TWICE DAILY 60 tablet 2  . CALCIUM CARBONATE-VIT D-MIN PO Take 1,200 mg by mouth.    . Cholecalciferol (VITAMIN D3) 1000 units CAPS Take by mouth.    . folic acid (FOLVITE) 1 MG tablet TK 1 T PO  D  3  . gabapentin (  NEURONTIN) 100 MG capsule Take 2 capsules (200 mg total) by mouth 2 (two) times daily. 56 capsule 0  . hydroxychloroquine (PLAQUENIL) 200 MG tablet Take 200 mg by mouth 2 (two) times daily.    Marland Kitchen lisinopril-hydrochlorothiazide (ZESTORETIC) 10-12.5 MG tablet TAKE 1 TABLET BY MOUTH DAILY 90 tablet 1  . sulfaSALAzine (AZULFIDINE) 500 MG EC tablet Take by mouth.    . triamcinolone cream (KENALOG) 0.1 % APPLY BID TO AA FOR UP TO TWO WEEKS THEN BID ON THE WEEKENDS ONLY THEREAFTER. DO NOT USE ON FACE OR SKIN FOLDS    . valACYclovir (VALTREX) 1000 MG tablet Take 1 tablet (1,000 mg total) by mouth 3 (three) times daily. 21 tablet 0  . diclofenac sodium (VOLTAREN) 1 % GEL Apply 2 g topically 4 (four) times daily. (Patient not taking: Reported on 04/04/2020) 1 Tube 2  . predniSONE (DELTASONE)  20 MG tablet Take 1 tablet (20 mg total) by mouth daily with breakfast. (Patient not taking: Reported on 06/08/2020) 5 tablet 0   No facility-administered medications prior to visit.     ROS Review of Systems  Constitutional: Negative for activity change, appetite change and fatigue.  HENT: Negative for congestion, sinus pressure and sore throat.   Eyes: Negative for visual disturbance.  Respiratory: Negative for cough, chest tightness, shortness of breath and wheezing.   Cardiovascular: Negative for chest pain and palpitations.  Gastrointestinal: Negative for abdominal distention, abdominal pain and constipation.  Endocrine: Negative for polydipsia.  Genitourinary: Negative for dysuria and frequency.  Musculoskeletal: Positive for neck pain. Negative for arthralgias and back pain.  Skin: Negative for rash.  Neurological: Negative for tremors, light-headedness and numbness.  Hematological: Does not bruise/bleed easily.  Psychiatric/Behavioral: Negative for agitation and behavioral problems.    Objective:  BP 134/69   Pulse 80   Ht 5\' 5"  (1.651 m)   Wt 150 lb 6.4 oz (68.2 kg)   SpO2 98%   BMI 25.03 kg/m   BP/Weight 06/08/2020 04/04/2020 9/67/5916  Systolic BP 384 665 993  Diastolic BP 69 57 71  Wt. (Lbs) 150.4 155 160  BMI 25.03 25.79 26.63      Physical Exam Constitutional:      Appearance: She is well-developed.  Neck:     Vascular: No JVD.     Comments: Right neck mass attached to sternocleidomastoid muscle which is tender to palpation Cardiovascular:     Rate and Rhythm: Normal rate.     Heart sounds: Normal heart sounds. No murmur heard.   Pulmonary:     Effort: Pulmonary effort is normal.     Breath sounds: Normal breath sounds. No wheezing or rales.  Chest:     Chest wall: No tenderness.  Abdominal:     General: Bowel sounds are normal. There is no distension.     Palpations: Abdomen is soft. There is no mass.     Tenderness: There is no abdominal  tenderness.  Musculoskeletal:        General: Normal range of motion.     Cervical back: No rigidity.     Right lower leg: No edema.     Left lower leg: No edema.  Lymphadenopathy:     Cervical: No cervical adenopathy.  Neurological:     Mental Status: She is alert and oriented to person, place, and time.  Psychiatric:        Mood and Affect: Mood normal.     CMP Latest Ref Rng & Units 06/02/2020 05/07/2019 11/19/2016  Glucose 65 -  99 mg/dL 83 92 103(H)  BUN 8 - 27 mg/dL 12 12 7(L)  Creatinine 0.57 - 1.00 mg/dL 0.93 0.74 0.81  Sodium 134 - 144 mmol/L 139 143 138  Potassium 3.5 - 5.2 mmol/L 3.9 4.3 4.9  Chloride 96 - 106 mmol/L 101 105 98  CO2 20 - 29 mmol/L 23 26 26   Calcium 8.7 - 10.3 mg/dL 9.5 9.1 10.1  Total Protein 6.0 - 8.5 g/dL 6.6 6.6 -  Total Bilirubin 0.0 - 1.2 mg/dL 0.5 0.4 -  Alkaline Phos 44 - 121 IU/L 59 58 -  AST 0 - 40 IU/L 30 35 -  ALT 0 - 32 IU/L 15 18 -    Lipid Panel     Component Value Date/Time   CHOL 133 06/02/2020 0845   TRIG 84 06/02/2020 0845   HDL 67 06/02/2020 0845   CHOLHDL 2.0 06/02/2020 0845   CHOLHDL 2.5 02/26/2016 1132   VLDL 28 02/26/2016 1132   LDLCALC 50 06/02/2020 0845    CBC    Component Value Date/Time   WBC 6.0 06/02/2020 0845   WBC 8.0 02/26/2016 1132   RBC 4.32 06/02/2020 0845   RBC 5.19 (H) 02/26/2016 1132   HGB 13.8 06/02/2020 0845   HCT 39.7 06/02/2020 0845   PLT 114 (L) 06/02/2020 0845   MCV 92 06/02/2020 0845   MCH 31.9 06/02/2020 0845   MCH 30.4 02/26/2016 1132   MCHC 34.8 06/02/2020 0845   MCHC 33.8 02/26/2016 1132   RDW 13.0 06/02/2020 0845   LYMPHSABS 2.9 11/19/2016 1223   MONOABS 720 02/26/2016 1132   EOSABS 0.2 11/19/2016 1223   BASOSABS 0.0 11/19/2016 1223    Lab Results  Component Value Date   HGBA1C 5.2 11/19/2016    Lab Results  Component Value Date   TSH 2.220 01/07/2020    Assessment & Plan:  1. Neck mass No evidence of infection, recent CBC was unremarkable Neck mass is acute but  given underlying weight loss will need to exclude malignancy - US Soft Tissue Head/Neck (NON-THYROID); Future  2. Weight loss Weight loss of 10 pounds Up-to-date on cancer screenings for health maintenance Nausea and reduced appetite could be contributory Work-up so far unrevealing We will follow up on neck ultrasound and advised to follow-up with PCP regarding this.    Follow-up: Return in about 1 month (around 07/08/2020) for Dr Wynetta Emery - follow up of neck mass.       Charlott Rakes, MD, FAAFP. [facility] and [place], [place]   06/08/2020, 11:53 AM

## 2020-06-13 ENCOUNTER — Ambulatory Visit (HOSPITAL_COMMUNITY)
Admission: RE | Admit: 2020-06-13 | Discharge: 2020-06-13 | Disposition: A | Discharge status: Home / Self Care | Payer: Medicare Other | Source: Ambulatory Visit | Attending: Family Medicine | Admitting: Family Medicine

## 2020-06-13 ENCOUNTER — Other Ambulatory Visit: Payer: Self-pay

## 2020-06-13 DIAGNOSIS — R221 Localized swelling, mass and lump, neck: Secondary | ICD-10-CM | POA: Diagnosis present

## 2020-06-14 ENCOUNTER — Other Ambulatory Visit: Payer: Self-pay | Admitting: Family Medicine

## 2020-06-14 DIAGNOSIS — R221 Localized swelling, mass and lump, neck: Secondary | ICD-10-CM

## 2020-06-16 ENCOUNTER — Ambulatory Visit: Payer: Self-pay | Admitting: *Deleted

## 2020-06-16 ENCOUNTER — Telehealth: Payer: Self-pay

## 2020-06-16 ENCOUNTER — Ambulatory Visit (HOSPITAL_COMMUNITY)
Admission: RE | Admit: 2020-06-16 | Discharge: 2020-06-16 | Disposition: A | Discharge status: Home / Self Care | Payer: Medicare Other | Source: Ambulatory Visit | Attending: Family Medicine | Admitting: Family Medicine

## 2020-06-16 ENCOUNTER — Telehealth: Payer: Self-pay | Admitting: Internal Medicine

## 2020-06-16 ENCOUNTER — Other Ambulatory Visit: Payer: Self-pay

## 2020-06-16 DIAGNOSIS — R221 Localized swelling, mass and lump, neck: Secondary | ICD-10-CM

## 2020-06-16 DIAGNOSIS — R634 Abnormal weight loss: Secondary | ICD-10-CM

## 2020-06-16 MED ORDER — IOHEXOL 300 MG/ML  SOLN
75.0000 mL | Freq: Once | INTRAMUSCULAR | Status: AC | PRN
  Administered 2020-06-16: 75 mL via INTRAVENOUS

## 2020-06-16 NOTE — Telephone Encounter (Signed)
[facility] Radiologist calling with stat report for CT of neck  by neuroradiologist Dr. Kellie Simmering Ordered by Dr. Charlott Rakes Impression. 2.0 x 3.2 x 2.5 cm supraglottic laryngeal mass involving the epiglottis and extending along the aryepiglottic folds bilaterally. Tumor extends into the pre-epiglottic fat and likely into the vallecula. Tumor extends into the upper thyroid cartilage bilaterally, abutting and possibly extending through the outer cortex (particularly on the left). 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 ENT consultation is Recommended. Results available in Epic.

## 2020-06-16 NOTE — Telephone Encounter (Signed)
Return call from Sylvester Harder, NP received. Stat CT results given and reviewed per [facility] Radiology of CT of neck ordered by Dr. Margarita Rana.  Note [facility] Radiologist calling with stat report for CT of neck  by neuroradiologist Dr. Kellie Simmering Ordered by Dr. Charlott Rakes Impression. 2.0 x 3.2 x 2.5 cm supraglottic laryngeal mass involving the epiglottis and extending along the aryepiglottic folds bilaterally. Tumor extends into the pre-epiglottic fat and likely into the vallecula. Tumor extends into the upper thyroid cartilage bilaterally, abutting and possibly extending through the outer cortex (particularly on the left). 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 ENT consultation is Recommended. Results available in Epic.      No new orders at this time.

## 2020-06-16 NOTE — Telephone Encounter (Signed)
On call provider, Juluis Mire called and left message to return call for stat report of CT of neck ordered by Dr. Margarita Rana.

## 2020-06-16 NOTE — Telephone Encounter (Signed)
Patient name and DOB has been verified Patient was informed of lab results. Patient had no questions.  Patient has been informed of CT scan appointment.

## 2020-06-16 NOTE — Telephone Encounter (Signed)
-----   Message from Charlott Rakes, MD sent at 06/14/2020  1:04 PM EST ----- Ultrasound of the neck reveals an enlarged lymph node with an additional test by means of CT is recommended which I have ordered.  Can you please schedule this for the patient.

## 2020-06-17 ENCOUNTER — Encounter (INDEPENDENT_AMBULATORY_CARE_PROVIDER_SITE_OTHER): Payer: Self-pay | Admitting: Primary Care

## 2020-06-17 ENCOUNTER — Other Ambulatory Visit (INDEPENDENT_AMBULATORY_CARE_PROVIDER_SITE_OTHER): Payer: Self-pay | Admitting: Primary Care

## 2020-06-17 DIAGNOSIS — C329 Malignant neoplasm of larynx, unspecified: Secondary | ICD-10-CM

## 2020-06-19 NOTE — Telephone Encounter (Signed)
Phone call placed to patient this morning to go over the results of the CAT scan of the neck that was ordered by Dr. Margarita Rana.  Patient states that she has seen the report on her MyChart account.  Informed her that the mass is concerning for underlying cancer and we will need to refer her to an ear nose and throat specialist for further evaluation and management as soon as possible.  She denies any dysphagia at this time.  Referral submitted.  Patient advised she will be receiving a call for the appointment.

## 2020-06-19 NOTE — Addendum Note (Signed)
Addended by: Karle Plumber B on: 06/19/2020 08:17 AM   Modules accepted: Orders

## 2020-06-20 ENCOUNTER — Telehealth: Payer: Self-pay

## 2020-06-20 NOTE — Telephone Encounter (Signed)
error 

## 2020-06-21 ENCOUNTER — Telehealth: Payer: Self-pay | Admitting: Internal Medicine

## 2020-06-21 NOTE — Telephone Encounter (Signed)
Returned pt call and provided referral information to the ENT.  Provided phone number and provider name   Pt doesn't have any questions or concerns

## 2020-06-21 NOTE — Telephone Encounter (Signed)
-----   Message from Ena Dawley sent at 06/21/2020  2:16 PM EST ----- Regarding: RE: Please get her in with ENT ASAP  Patient scheduled  06-23-2020 @ 1;15pm with Dr  Radene Journey  ----- Message ----- From: Ladell Pier, MD Sent: 06/19/2020   8:16 AM EST To: Ena Dawley Subject: Please get her in with ENT ASAP

## 2020-06-23 ENCOUNTER — Encounter (HOSPITAL_COMMUNITY): Payer: Self-pay | Admitting: Otolaryngology

## 2020-06-23 ENCOUNTER — Other Ambulatory Visit: Payer: Self-pay

## 2020-06-23 ENCOUNTER — Ambulatory Visit (INDEPENDENT_AMBULATORY_CARE_PROVIDER_SITE_OTHER): Payer: Self-pay | Admitting: Otolaryngology

## 2020-06-23 ENCOUNTER — Ambulatory Visit (INDEPENDENT_AMBULATORY_CARE_PROVIDER_SITE_OTHER): Payer: Medicare Other | Admitting: Otolaryngology

## 2020-06-23 ENCOUNTER — Other Ambulatory Visit: Payer: Self-pay | Admitting: Otolaryngology

## 2020-06-23 ENCOUNTER — Encounter (INDEPENDENT_AMBULATORY_CARE_PROVIDER_SITE_OTHER): Payer: Self-pay | Admitting: Otolaryngology

## 2020-06-23 VITALS — Temp 97.7°F

## 2020-06-23 DIAGNOSIS — C100 Malignant neoplasm of vallecula: Secondary | ICD-10-CM | POA: Diagnosis not present

## 2020-06-23 DIAGNOSIS — C101 Malignant neoplasm of anterior surface of epiglottis: Secondary | ICD-10-CM

## 2020-06-23 DIAGNOSIS — C321 Malignant neoplasm of supraglottis: Secondary | ICD-10-CM | POA: Diagnosis not present

## 2020-06-23 NOTE — Progress Notes (Signed)
HPI: Raven Torres is a 64 y.o. female who presents is referred by her PCP for evaluation of possible laryngeal cancer.  She apparently has noticed a gradually enlarging right neck mass over the past 2 months.  She had a CT scan of the neck performed on 06/16/2020 that demonstrated a 3-1/2 cm right neck node that was consistent with metastatic squamous cell carcinoma and a 2 x 3 cm supraglottic mass involving the epiglottis that most likely represents the primary.  She has not had a biopsy yet. She has a significant smoking history of 1 to 2 packs/day since she was a teenager.  She has been trying to quit over the past year but continues to smoke.  Denies significant alcohol use. She states that she has lost about 30 pounds over the past 4 months. She lives with her daughter and grandkids.  Past Medical History:  Diagnosis Date  . Arthritis   . Cataract    removed both eyes   . Colitis   . COPD (chronic obstructive pulmonary disease) ([place])   . Hepatitis C   . Hyperlipidemia   . Hypertension   . Shingles 03/27/2020  . TB (tuberculosis)    treatment    Past Surgical History:  Procedure Laterality Date  . ABDOMINAL HYSTERECTOMY    . CATARACT EXTRACTION, BILATERAL    . COLONOSCOPY    . COLONOSCOPY  04/04/2020  . KNEE SURGERY Right   . POLYPECTOMY  11/17/2008   HPP x 1    Social History   Socioeconomic History  . Marital status: Married    Spouse name: Not on file  . Number of children: Not on file  . Years of education: Not on file  . Highest education level: Not on file  Occupational History  . Not on file  Tobacco Use  . Smoking status: Current Every Day Smoker    Packs/day: 1.00    Years: 50.00    Pack years: 50.00    Types: Cigarettes    Start date: 4  . Smokeless tobacco: Never Used  . Tobacco comment: 2 cigs a day   Substance and Sexual Activity  . Alcohol use: No    Alcohol/week: 0.0 standard drinks  . Drug use: No  . Sexual activity: Never  Other Topics  Concern  . Not on file  Social History Narrative  . Not on file   Social Determinants of Health   Financial Resource Strain:   . Difficulty of Paying Living Expenses: Not on file  Food Insecurity:   . Worried About Charity fundraiser in the Last Year: Not on file  . Ran Out of Food in the Last Year: Not on file  Transportation Needs:   . Lack of Transportation (Medical): Not on file  . Lack of Transportation (Non-Medical): Not on file  Physical Activity:   . Days of Exercise per Week: Not on file  . Minutes of Exercise per Session: Not on file  Stress:   . Feeling of Stress : Not on file  Social Connections:   . Frequency of Communication with Friends and Family: Not on file  . Frequency of Social Gatherings with Friends and Family: Not on file  . Attends Religious Services: Not on file  . Active Member of Clubs or Organizations: Not on file  . Attends Archivist Meetings: Not on file  . Marital Status: Not on file   Family History  Problem Relation Age of Onset  . Heart disease  Mother   . Cancer Mother   . Melanoma Mother   . Esophageal cancer Father   . Colon cancer Neg Hx   . Colon polyps Neg Hx   . Rectal cancer Neg Hx   . Stomach cancer Neg Hx    Allergies  Allergen Reactions  . Tudorza Pressair [Aclidinium Bromide] Itching  . Amitriptyline Other (See Comments)    Eye spasms per pt  . Codeine Nausea Only  . Tramadol Nausea Only   Prior to Admission medications   Medication Sig Start Date End Date Taking? Authorizing Provider  albuterol (PROVENTIL) (2.5 MG/3ML) 0.083% nebulizer solution Take 3 mLs (2.5 mg total) by nebulization every 6 (six) hours as needed for wheezing or shortness of breath. 05/11/20  Yes Ladell Pier, MD  albuterol (VENTOLIN HFA) 108 (90 Base) MCG/ACT inhaler INHALE 2 PUFFS INTO THE LUNGS EVERY 6 HOURS AS NEEDED FOR WHEEZING OR SHORTNESS OF BREATH 03/29/20  Yes Magdalen Spatz, NP  aspirin EC 81 MG tablet Take 1 tablet (81 mg  total) by mouth daily. 03/11/18  Yes Ladell Pier, MD  atorvastatin (LIPITOR) 40 MG tablet TAKE 1 TABLET(40 MG) BY MOUTH DAILY AT 6 PM 04/07/20  Yes Ladell Pier, MD  azithromycin (ZITHROMAX Z-PAK) 250 MG tablet 2 tabs PO x 1 then 1 tab daily 05/11/20  Yes Ladell Pier, MD  BEVESPI AEROSPHERE 9-4.8 MCG/ACT AERO INHALE 2 PUFFS INTO THE LUNGS TWICE DAILY 04/11/20  Yes Mannam, Praveen, MD  buPROPion (WELLBUTRIN) 100 MG tablet TAKE 1 TABLET(100 MG) BY MOUTH TWICE DAILY 06/05/20  Yes Ladell Pier, MD  CALCIUM CARBONATE-VIT D-MIN PO Take 1,200 mg by mouth.   Yes [provider]  Cholecalciferol (VITAMIN D3) 1000 units CAPS Take by mouth.   Yes [provider]  diclofenac sodium (VOLTAREN) 1 % GEL Apply 2 g topically 4 (four) times daily. 11/19/16  Yes Langeland, Dawn T, MD  folic acid (FOLVITE) 1 MG tablet TK 1 T PO  D 08/14/17  Yes [provider]  gabapentin (NEURONTIN) 100 MG capsule Take 2 capsules (200 mg total) by mouth 2 (two) times daily. 03/24/20  Yes Ladell Pier, MD  hydroxychloroquine (PLAQUENIL) 200 MG tablet Take 200 mg by mouth 2 (two) times daily.   Yes [provider]  lisinopril-hydrochlorothiazide (ZESTORETIC) 10-12.5 MG tablet TAKE 1 TABLET BY MOUTH DAILY 03/08/20  Yes Ladell Pier, MD  predniSONE (DELTASONE) 20 MG tablet Take 1 tablet (20 mg total) by mouth daily with breakfast. 05/11/20  Yes Ladell Pier, MD  sulfaSALAzine (AZULFIDINE) 500 MG EC tablet Take by mouth. 08/13/17  Yes [provider]  triamcinolone cream (KENALOG) 0.1 % APPLY BID TO AA FOR UP TO TWO WEEKS THEN BID ON THE WEEKENDS ONLY THEREAFTER. DO NOT USE ON FACE OR SKIN FOLDS 07/09/19  Yes [provider]  valACYclovir (VALTREX) 1000 MG tablet Take 1 tablet (1,000 mg total) by mouth 3 (three) times daily. 03/24/20  Yes Ladell Pier, MD     Positive ROS: Otherwise negative  All other systems have been reviewed and were otherwise  negative with the exception of those mentioned in the HPI and as above.  Physical Exam: Constitutional: Alert, well-appearing, no acute distress.  No significant hoarseness.  No breathing difficulty. Ears: External ears without lesions or tenderness. Ear canals are clear bilaterally with intact, clear TMs.  Nasal: External nose without lesions. Septum midline.. Clear nasal passages. Oral: Lips and gums without lesions. Tongue and  palate mucosa without lesions. Posterior oropharynx clear. Fiberoptic laryngoscopy was performed through the left nostril.  The base of tongue vallecula and tip of the epiglottis appeared normal.  On evaluation of the laryngeal surface of epiglottis she has some irregularity.  The vocal cords were clear with normal vocal cord mobility bilaterally. Neck: She has a large right neck mass in the mid upper neck just deep to the sternocleidomastoid muscle. Respiratory: Breathing comfortably  Skin: No facial/neck lesions or rash noted.  Laryngoscopy  Date/Time: 06/23/2020 5:28 PM Performed by: Rozetta Nunnery, MD Authorized by: Rozetta Nunnery, MD   Consent:    Consent obtained:  Verbal   Consent given by:  Patient Procedure details:    Indications: direct visualization of the upper aerodigestive tract     Medication:  Afrin   Instrument: flexible fiberoptic laryngoscope     Scope location: left nare   Sinus:    Left nasopharynx: normal   Mouth:    Oropharynx: normal     Vallecula: normal     Base of tongue: normal   Throat:    True vocal cords: normal   Comments:     On fiberoptic laryngoscopy patient has some irregular mucosa on the laryngeal surface of the epiglottis with thickened AE fold.  Vocal cords appear clear with normal vocal mobility.    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  Plan: We will schedule patient for  direct laryngoscopy and biopsy. We will also schedule a PET scan and plan on presenting patient to head neck tumor board in 2 weeks.   Radene Journey, MD   CC:

## 2020-06-23 NOTE — H&P (Signed)
PREOPERATIVE H&P  Chief Complaint: Weight loss and enlarged right neck node  HPI: Raven Torres is a 64 y.o. female who presents for evaluation of weight loss and enlarged right neck node.  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 She has had a approximate 30 pound weight loss over the past 4 months.  She is having no airway problems or hoarseness. On fiberoptic laryngoscopy she has abnormality of the laryngeal surface of the epiglottis and is taken to the operating room at this time for direct laryngoscopy and biopsy. She has a significant smoking history since she was a teenager of 1 to 2packs/day.  She has been trying to quit over the past year.  Past Medical History:  Diagnosis Date  . Arthritis   . Cataract    removed both eyes   . Colitis   . COPD (chronic obstructive pulmonary disease) ([place])   . Hepatitis C   . Hyperlipidemia   . Hypertension   . Shingles 03/27/2020  . TB (tuberculosis)    treatment    Past Surgical History:  Procedure Laterality Date  . ABDOMINAL HYSTERECTOMY    . CATARACT EXTRACTION, BILATERAL    . COLONOSCOPY    . COLONOSCOPY  04/04/2020  . KNEE SURGERY Right   . POLYPECTOMY  11/17/2008   HPP x 1    Social History   Socioeconomic History  . Marital status: Married    Spouse name: Not on file  . Number of children: Not on file  . Years of education: Not on file  . Highest education level: Not on file  Occupational History  . Not on file  Tobacco Use  . Smoking status: Current Every Day Smoker    Packs/day: 1.00    Years: 50.00    Pack years: 50.00    Types: Cigarettes    Start date: 8  . Smokeless tobacco: Never Used  . Tobacco comment: 2 cigs a day   Substance and Sexual Activity  . Alcohol use: No    Alcohol/week: 0.0 standard drinks  . Drug use: No  . Sexual activity:  Never  Other Topics Concern  . Not on file  Social History Narrative  . Not on file   Social Determinants of Health   Financial Resource Strain:   . Difficulty of Paying Living Expenses: Not on file  Food Insecurity:   . Worried About Charity fundraiser in the Last Year: Not on file  . Ran Out of Food in the Last Year: Not on file  Transportation Needs:   . Lack of Transportation (Medical): Not on file  . Lack of Transportation (Non-Medical): Not on file  Physical Activity:   . Days of Exercise per Week: Not on file  . Minutes of Exercise per Session: Not on file  Stress:   . Feeling of Stress : Not on file  Social Connections:   . Frequency of Communication with Friends and Family: Not on file  . Frequency of Social Gatherings with Friends and Family: Not on file  . Attends Religious Services: Not on file  . Active Member of Clubs or Organizations: Not on file  . Attends Archivist Meetings: Not on file  . Marital Status: Not on file   Family History  Problem Relation Age of Onset  . Heart disease Mother   .  Cancer Mother   . Melanoma Mother   . Esophageal cancer Father   . Colon cancer Neg Hx   . Colon polyps Neg Hx   . Rectal cancer Neg Hx   . Stomach cancer Neg Hx    Allergies  Allergen Reactions  . Tudorza Pressair [Aclidinium Bromide] Itching  . Amitriptyline Other (See Comments)    Eye spasms per pt  . Codeine Nausea Only  . Tramadol Nausea Only   Prior to Admission medications   Medication Sig Start Date End Date Taking? Authorizing Provider  albuterol (PROVENTIL) (2.5 MG/3ML) 0.083% nebulizer solution Take 3 mLs (2.5 mg total) by nebulization every 6 (six) hours as needed for wheezing or shortness of breath. 05/11/20   Ladell Pier, MD  albuterol (VENTOLIN HFA) 108 (90 Base) MCG/ACT inhaler INHALE 2 PUFFS INTO THE LUNGS EVERY 6 HOURS AS NEEDED FOR WHEEZING OR SHORTNESS OF BREATH 03/29/20   Magdalen Spatz, NP  aspirin EC 81 MG tablet Take 1  tablet (81 mg total) by mouth daily. 03/11/18   Ladell Pier, MD  atorvastatin (LIPITOR) 40 MG tablet TAKE 1 TABLET(40 MG) BY MOUTH DAILY AT 6 PM 04/07/20   Ladell Pier, MD  azithromycin (ZITHROMAX Z-PAK) 250 MG tablet 2 tabs PO x 1 then 1 tab daily 05/11/20   Ladell Pier, MD  BEVESPI AEROSPHERE 9-4.8 MCG/ACT AERO INHALE 2 PUFFS INTO THE LUNGS TWICE DAILY 04/11/20   Mannam, Hart Robinsons, MD  buPROPion (WELLBUTRIN) 100 MG tablet TAKE 1 TABLET(100 MG) BY MOUTH TWICE DAILY 06/05/20   Ladell Pier, MD  CALCIUM CARBONATE-VIT D-MIN PO Take 1,200 mg by mouth.    [provider]  Cholecalciferol (VITAMIN D3) 1000 units CAPS Take by mouth.    [provider]  diclofenac sodium (VOLTAREN) 1 % GEL Apply 2 g topically 4 (four) times daily. 11/19/16   Maren Reamer, MD  folic acid (FOLVITE) 1 MG tablet TK 1 T PO  D 08/14/17   [provider]  gabapentin (NEURONTIN) 100 MG capsule Take 2 capsules (200 mg total) by mouth 2 (two) times daily. 03/24/20   Ladell Pier, MD  hydroxychloroquine (PLAQUENIL) 200 MG tablet Take 200 mg by mouth 2 (two) times daily.    [provider]  lisinopril-hydrochlorothiazide (ZESTORETIC) 10-12.5 MG tablet TAKE 1 TABLET BY MOUTH DAILY 03/08/20   Ladell Pier, MD  predniSONE (DELTASONE) 20 MG tablet Take 1 tablet (20 mg total) by mouth daily with breakfast. 05/11/20   Ladell Pier, MD  sulfaSALAzine (AZULFIDINE) 500 MG EC tablet Take by mouth. 08/13/17   [provider]  triamcinolone cream (KENALOG) 0.1 % APPLY BID TO AA FOR UP TO TWO WEEKS THEN BID ON THE WEEKENDS ONLY THEREAFTER. DO NOT USE ON FACE OR SKIN FOLDS 07/09/19   [provider]  valACYclovir (VALTREX) 1000 MG tablet Take 1 tablet (1,000 mg total) by mouth 3 (three) times daily. 03/24/20   Ladell Pier, MD     Positive ROS: Otherwise negative  All other systems have been reviewed and were otherwise negative with the exception of those  mentioned in the HPI and as above.  Physical Exam: There were no vitals filed for this visit.  General: Alert, no acute distress Oral: Normal oral mucosa and tonsils Nasal: Clear nasal passages Fiberoptic laryngoscopy revealed clear base of tongue and superior aspect of the epiglottis however she has had abnormality on the laryngeal surface of the epiglottis.  Vocal cords were  clear bilaterally with normal vocal cord mobility. Neck: Enlarged 3.5 cm right neck node just deep to the sternocleidomastoid muscle mid right neck. Ear: Ear canal is clear with normal appearing TMs Cardiovascular: Regular rate and rhythm, no murmur.  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  Plan for direct laryngoscopy and biopsy.   Melony Overly, MD 06/23/2020 5:59 PM

## 2020-06-23 NOTE — Progress Notes (Signed)
PCP - Dr. Wynetta Emery - [facility] Cardiologist - [facility]  - per pt she does not see anyone specifically   Chest x-ray - n/a EKG - DOS Stress Test - 06/12/17 ECHO - denies Cardiac Cath - denies   Blood Thinner Instructions: n/a Aspirin Instructions:  Per surgeon's orders - ok to stop taking ASA    ERAS Protcol - n/a  COVID TEST- DOS  Anesthesia review: n/a   -------------  SDW INSTRUCTIONS:  Your procedure is scheduled on 06/26/20.  Report to [facility] Main Entrance "A" at 0600 A.M., and check in at the Admitting office.  Call this number if you have problems the morning of surgery: [phone]   Remember: Do not eat or drink after midnight the night before your surgery   Take these medicines the morning of surgery with A SIP OF WATER: BEVESPI AEROSPHERE azithromycin (ZITHROMAX Z-PAK)  buPROPion (WELLBUTRIN) hydroxychloroquine (PLAQUENIL) gabapentin (NEURONTIN)  sulfaSALAzine (AZULFIDINE)  valACYclovir (VALTREX)   If needed: albuterol (PROVENTIL) nebulizer  albuterol (VENTOLIN HFA) inhaler --- Please bring all inhalers with you the day of surgery.    Per surgeon's orders - ok to stop taking ASA   As of today, STOP taking any diclofenac sodium (VOLTAREN) gel, Aleve, Naproxen, Ibuprofen, Motrin, Advil, Goody's, BC's, all herbal medications, fish oil, and all vitamins.    The Morning of Surgery  Do not wear jewelry, make-up or nail polish.  Do not wear lotions, powders, or perfumes, or deodorant  Do not shave 48 hours prior to surgery.    Do not bring valuables to the hospital.  [facility] is not responsible for any belongings or valuables.  If you are a smoker, DO NOT Smoke 24 hours prior to surgery  If you wear a CPAP at night please bring your mask the morning of surgery   Remember that you must have someone to transport you home after your surgery, and remain with you for 24 hours if you are discharged the same day.  Please  bring cases for contacts, glasses, hearing aids, dentures or bridgework because it cannot be worn into surgery.   Leave your suitcase in the car.  After surgery it may be brought to your room.  For patients admitted to the hospital, discharge time will be determined by your treatment team.  Patients discharged the day of surgery will not be allowed to drive home.    Special instructions:   [place]- Preparing For Surgery  Oral Hygiene is also important to reduce your risk of infection.  Remember - BRUSH YOUR TEETH THE MORNING OF SURGERY WITH YOUR REGULAR TOOTHPASTE  Please follow these instructions carefully.   1. Shower the NIGHT BEFORE SURGERY and the MORNING OF SURGERY with DIAL Soap.   2. Wash thoroughly, paying special attention to the area where your surgery will be performed.  3. Thoroughly rinse your body with warm water from the neck down.  4. Pat yourself dry with a CLEAN TOWEL.  5. Wear CLEAN PAJAMAS to bed the night before surgery  6. Place CLEAN SHEETS on your bed the night of your first shower and DO NOT SLEEP WITH PETS.  7. Wear comfortable clothes the morning of surgery.    Day of Surgery:  Please shower the morning of surgery with the DIAL soap Do not apply any deodorants/lotions. Please wear clean clothes to the hospital/surgery center.   Remember to brush your teeth WITH YOUR REGULAR TOOTHPASTE.   Please read over the following fact  sheets that you were given.  Patient denies shortness of breath, fever, cough and chest pain.

## 2020-06-23 NOTE — H&P (View-Only) (Signed)
PREOPERATIVE H&P  Chief Complaint: Weight loss and enlarged right neck node  HPI: Raven Torres is a 64 y.o. female who presents for evaluation of weight loss and enlarged right neck node.  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 She has had a approximate 30 pound weight loss over the past 4 months.  She is having no airway problems or hoarseness. On fiberoptic laryngoscopy she has abnormality of the laryngeal surface of the epiglottis and is taken to the operating room at this time for direct laryngoscopy and biopsy. She has a significant smoking history since she was a teenager of 1 to 2packs/day.  She has been trying to quit over the past year.  Past Medical History:  Diagnosis Date  . Arthritis   . Cataract    removed both eyes   . Colitis   . COPD (chronic obstructive pulmonary disease) ([place])   . Hepatitis C   . Hyperlipidemia   . Hypertension   . Shingles 03/27/2020  . TB (tuberculosis)    treatment    Past Surgical History:  Procedure Laterality Date  . ABDOMINAL HYSTERECTOMY    . CATARACT EXTRACTION, BILATERAL    . COLONOSCOPY    . COLONOSCOPY  04/04/2020  . KNEE SURGERY Right   . POLYPECTOMY  11/17/2008   HPP x 1    Social History   Socioeconomic History  . Marital status: Married    Spouse name: Not on file  . Number of children: Not on file  . Years of education: Not on file  . Highest education level: Not on file  Occupational History  . Not on file  Tobacco Use  . Smoking status: Current Every Day Smoker    Packs/day: 1.00    Years: 50.00    Pack years: 50.00    Types: Cigarettes    Start date: 16  . Smokeless tobacco: Never Used  . Tobacco comment: 2 cigs a day   Substance and Sexual Activity  . Alcohol use: No    Alcohol/week: 0.0 standard drinks  . Drug use: No  . Sexual activity:  Never  Other Topics Concern  . Not on file  Social History Narrative  . Not on file   Social Determinants of Health   Financial Resource Strain:   . Difficulty of Paying Living Expenses: Not on file  Food Insecurity:   . Worried About Charity fundraiser in the Last Year: Not on file  . Ran Out of Food in the Last Year: Not on file  Transportation Needs:   . Lack of Transportation (Medical): Not on file  . Lack of Transportation (Non-Medical): Not on file  Physical Activity:   . Days of Exercise per Week: Not on file  . Minutes of Exercise per Session: Not on file  Stress:   . Feeling of Stress : Not on file  Social Connections:   . Frequency of Communication with Friends and Family: Not on file  . Frequency of Social Gatherings with Friends and Family: Not on file  . Attends Religious Services: Not on file  . Active Member of Clubs or Organizations: Not on file  . Attends Archivist Meetings: Not on file  . Marital Status: Not on file   Family History  Problem Relation Age of Onset  . Heart disease Mother   .  Cancer Mother   . Melanoma Mother   . Esophageal cancer Father   . Colon cancer Neg Hx   . Colon polyps Neg Hx   . Rectal cancer Neg Hx   . Stomach cancer Neg Hx    Allergies  Allergen Reactions  . Tudorza Pressair [Aclidinium Bromide] Itching  . Amitriptyline Other (See Comments)    Eye spasms per pt  . Codeine Nausea Only  . Tramadol Nausea Only   Prior to Admission medications   Medication Sig Start Date End Date Taking? Authorizing Provider  albuterol (PROVENTIL) (2.5 MG/3ML) 0.083% nebulizer solution Take 3 mLs (2.5 mg total) by nebulization every 6 (six) hours as needed for wheezing or shortness of breath. 05/11/20   Ladell Pier, MD  albuterol (VENTOLIN HFA) 108 (90 Base) MCG/ACT inhaler INHALE 2 PUFFS INTO THE LUNGS EVERY 6 HOURS AS NEEDED FOR WHEEZING OR SHORTNESS OF BREATH 03/29/20   Magdalen Spatz, NP  aspirin EC 81 MG tablet Take 1  tablet (81 mg total) by mouth daily. 03/11/18   Ladell Pier, MD  atorvastatin (LIPITOR) 40 MG tablet TAKE 1 TABLET(40 MG) BY MOUTH DAILY AT 6 PM 04/07/20   Ladell Pier, MD  azithromycin (ZITHROMAX Z-PAK) 250 MG tablet 2 tabs PO x 1 then 1 tab daily 05/11/20   Ladell Pier, MD  BEVESPI AEROSPHERE 9-4.8 MCG/ACT AERO INHALE 2 PUFFS INTO THE LUNGS TWICE DAILY 04/11/20   Mannam, Hart Robinsons, MD  buPROPion (WELLBUTRIN) 100 MG tablet TAKE 1 TABLET(100 MG) BY MOUTH TWICE DAILY 06/05/20   Ladell Pier, MD  CALCIUM CARBONATE-VIT D-MIN PO Take 1,200 mg by mouth.    [provider]  Cholecalciferol (VITAMIN D3) 1000 units CAPS Take by mouth.    [provider]  diclofenac sodium (VOLTAREN) 1 % GEL Apply 2 g topically 4 (four) times daily. 11/19/16   Maren Reamer, MD  folic acid (FOLVITE) 1 MG tablet TK 1 T PO  D 08/14/17   [provider]  gabapentin (NEURONTIN) 100 MG capsule Take 2 capsules (200 mg total) by mouth 2 (two) times daily. 03/24/20   Ladell Pier, MD  hydroxychloroquine (PLAQUENIL) 200 MG tablet Take 200 mg by mouth 2 (two) times daily.    [provider]  lisinopril-hydrochlorothiazide (ZESTORETIC) 10-12.5 MG tablet TAKE 1 TABLET BY MOUTH DAILY 03/08/20   Ladell Pier, MD  predniSONE (DELTASONE) 20 MG tablet Take 1 tablet (20 mg total) by mouth daily with breakfast. 05/11/20   Ladell Pier, MD  sulfaSALAzine (AZULFIDINE) 500 MG EC tablet Take by mouth. 08/13/17   [provider]  triamcinolone cream (KENALOG) 0.1 % APPLY BID TO AA FOR UP TO TWO WEEKS THEN BID ON THE WEEKENDS ONLY THEREAFTER. DO NOT USE ON FACE OR SKIN FOLDS 07/09/19   [provider]  valACYclovir (VALTREX) 1000 MG tablet Take 1 tablet (1,000 mg total) by mouth 3 (three) times daily. 03/24/20   Ladell Pier, MD     Positive ROS: Otherwise negative  All other systems have been reviewed and were otherwise negative with the exception of those  mentioned in the HPI and as above.  Physical Exam: There were no vitals filed for this visit.  General: Alert, no acute distress Oral: Normal oral mucosa and tonsils Nasal: Clear nasal passages Fiberoptic laryngoscopy revealed clear base of tongue and superior aspect of the epiglottis however she has had abnormality on the laryngeal surface of the epiglottis.  Vocal cords were  clear bilaterally with normal vocal cord mobility. Neck: Enlarged 3.5 cm right neck node just deep to the sternocleidomastoid muscle mid right neck. Ear: Ear canal is clear with normal appearing TMs Cardiovascular: Regular rate and rhythm, no murmur.  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  Plan for direct laryngoscopy and biopsy.   Melony Overly, MD 06/23/2020 5:59 PM

## 2020-06-25 NOTE — Anesthesia Preprocedure Evaluation (Addendum)
Anesthesia Evaluation  Patient identified by MRN, date of birth, ID band Patient awake    Reviewed: Allergy & Precautions, NPO status , Patient's Chart, lab work & pertinent test results  Airway Mallampati: II  TM Distance: >3 FB Neck ROM: Full    Dental  (+) Edentulous Upper, Edentulous Lower   Pulmonary COPD,  COPD inhaler, Current Smoker and Patient abstained from smoking.,    Pulmonary exam normal        Cardiovascular hypertension, Pt. on medications Normal cardiovascular exam     Neuro/Psych negative neurological ROS     GI/Hepatic negative GI ROS, (+) Hepatitis -, C  Endo/Other  negative endocrine ROS  Renal/GU negative Renal ROS     Musculoskeletal  (+) Arthritis , Fibromyalgia -  Abdominal   Peds  Hematology HLD   Anesthesia Other Findings LARYNGEAL CANCER Covid test negative   Reproductive/Obstetrics                            Anesthesia Physical Anesthesia Plan  ASA: III  Anesthesia Plan: General   Post-op Pain Management:    Induction: Intravenous  PONV Risk Score and Plan: 2 and Dexamethasone, Ondansetron and Treatment may vary due to age or medical condition  Airway Management Planned: Oral ETT and Video Laryngoscope Planned  Additional Equipment: None  Intra-op Plan:   Post-operative Plan: Extubation in OR  Informed Consent: I have reviewed the patients History and Physical, chart, labs and discussed the procedure including the risks, benefits and alternatives for the proposed anesthesia with the patient or authorized representative who has indicated his/her understanding and acceptance.     Dental advisory given  Plan Discussed with: CRNA and Anesthesiologist  Anesthesia Plan Comments:        Anesthesia Quick Evaluation

## 2020-06-26 ENCOUNTER — Other Ambulatory Visit: Payer: Self-pay

## 2020-06-26 ENCOUNTER — Encounter (HOSPITAL_COMMUNITY): Payer: Self-pay | Admitting: Otolaryngology

## 2020-06-26 ENCOUNTER — Encounter (HOSPITAL_COMMUNITY)
Admission: RE | Disposition: A | Discharge status: Home / Self Care | Payer: Self-pay | Source: Home / Self Care | Attending: Otolaryngology

## 2020-06-26 ENCOUNTER — Ambulatory Visit (HOSPITAL_COMMUNITY)
Admission: RE | Admit: 2020-06-26 | Discharge: 2020-06-26 | Disposition: A | Discharge status: Home / Self Care | Payer: Medicare Other | Attending: Otolaryngology | Admitting: Otolaryngology

## 2020-06-26 ENCOUNTER — Ambulatory Visit (HOSPITAL_COMMUNITY): Payer: Medicare Other | Admitting: Certified Registered"

## 2020-06-26 DIAGNOSIS — Z79899 Other long term (current) drug therapy: Secondary | ICD-10-CM | POA: Insufficient documentation

## 2020-06-26 DIAGNOSIS — Z20822 Contact with and (suspected) exposure to covid-19: Secondary | ICD-10-CM | POA: Insufficient documentation

## 2020-06-26 DIAGNOSIS — C321 Malignant neoplasm of supraglottis: Secondary | ICD-10-CM | POA: Diagnosis not present

## 2020-06-26 DIAGNOSIS — R599 Enlarged lymph nodes, unspecified: Secondary | ICD-10-CM | POA: Insufficient documentation

## 2020-06-26 DIAGNOSIS — Z7982 Long term (current) use of aspirin: Secondary | ICD-10-CM | POA: Insufficient documentation

## 2020-06-26 DIAGNOSIS — Z888 Allergy status to other drugs, medicaments and biological substances status: Secondary | ICD-10-CM | POA: Insufficient documentation

## 2020-06-26 DIAGNOSIS — Z885 Allergy status to narcotic agent status: Secondary | ICD-10-CM | POA: Insufficient documentation

## 2020-06-26 DIAGNOSIS — D491 Neoplasm of unspecified behavior of respiratory system: Secondary | ICD-10-CM | POA: Diagnosis present

## 2020-06-26 DIAGNOSIS — F17210 Nicotine dependence, cigarettes, uncomplicated: Secondary | ICD-10-CM | POA: Insufficient documentation

## 2020-06-26 DIAGNOSIS — R634 Abnormal weight loss: Secondary | ICD-10-CM | POA: Diagnosis not present

## 2020-06-26 DIAGNOSIS — Z6822 Body mass index (BMI) 22.0-22.9, adult: Secondary | ICD-10-CM | POA: Diagnosis not present

## 2020-06-26 DIAGNOSIS — C101 Malignant neoplasm of anterior surface of epiglottis: Secondary | ICD-10-CM

## 2020-06-26 DIAGNOSIS — D380 Neoplasm of uncertain behavior of larynx: Secondary | ICD-10-CM | POA: Diagnosis not present

## 2020-06-26 HISTORY — PX: DIRECT LARYNGOSCOPY: SHX5326

## 2020-06-26 LAB — COMPREHENSIVE METABOLIC PANEL
ALT: 15 U/L (ref 0–44)
AST: 30 U/L (ref 15–41)
Albumin: 3 g/dL — ABNORMAL LOW (ref 3.5–5.0)
Alkaline Phosphatase: 44 U/L (ref 38–126)
Anion gap: 11 (ref 5–15)
BUN: 12 mg/dL (ref 8–23)
CO2: 23 mmol/L (ref 22–32)
Calcium: 9 mg/dL (ref 8.9–10.3)
Chloride: 104 mmol/L (ref 98–111)
Creatinine, Ser: 0.87 mg/dL (ref 0.44–1.00)
GFR, Estimated: >60 mL/min (ref >60)
Glucose, Bld: 105 mg/dL — ABNORMAL HIGH (ref 70–99)
Potassium: 3.5 mmol/L (ref 3.5–5.1)
Sodium: 138 mmol/L (ref 135–145)
Total Bilirubin: 0.7 mg/dL (ref 0.3–1.2)
Total Protein: 6.4 g/dL — ABNORMAL LOW (ref 6.5–8.1)

## 2020-06-26 LAB — SARS CORONAVIRUS 2 BY RT PCR (HOSPITAL ORDER, PERFORMED IN ~~LOC~~ HOSPITAL LAB): SARS Coronavirus 2: NEGATIVE

## 2020-06-26 LAB — CBC
HCT: 41.9 % (ref 36.0–46.0)
Hemoglobin: 13.5 g/dL (ref 12.0–15.0)
MCH: 31.3 pg (ref 26.0–34.0)
MCHC: 32.2 g/dL (ref 30.0–36.0)
MCV: 97 fL (ref 80.0–100.0)
Platelets: 100 10*3/uL — ABNORMAL LOW (ref 150–400)
RBC: 4.32 MIL/uL (ref 3.87–5.11)
RDW: 13.2 % (ref 11.5–15.5)
WBC: 5.5 10*3/uL (ref 4.0–10.5)
nRBC: 0 % (ref 0.0–0.2)

## 2020-06-26 SURGERY — LARYNGOSCOPY, DIRECT
Anesthesia: General | Site: Mouth

## 2020-06-26 MED ORDER — CHLORHEXIDINE GLUCONATE CLOTH 2 % EX PADS: 6.0000 | MEDICATED_PAD | Freq: Once | CUTANEOUS | Status: DC

## 2020-06-26 MED ORDER — PROPOFOL 10 MG/ML IV BOLUS
INTRAVENOUS | Status: DC | PRN
  Administered 2020-06-26: 150 mg via INTRAVENOUS
  Administered 2020-06-26: 20 mg via INTRAVENOUS
  Administered 2020-06-26 (×2): 50 mg via INTRAVENOUS

## 2020-06-26 MED ORDER — EPINEPHRINE HCL (NASAL) 0.1 % NA SOLN
NASAL | Status: AC
  Filled 2020-06-26: qty 30

## 2020-06-26 MED ORDER — PROPOFOL 10 MG/ML IV BOLUS
INTRAVENOUS | Status: AC
  Filled 2020-06-26: qty 20

## 2020-06-26 MED ORDER — EPINEPHRINE HCL (NASAL) 0.1 % NA SOLN
NASAL | Status: DC | PRN
  Administered 2020-06-26: 30 mL via NASAL

## 2020-06-26 MED ORDER — MIDAZOLAM HCL 2 MG/2ML IJ SOLN
INTRAMUSCULAR | Status: AC
  Filled 2020-06-26: qty 2

## 2020-06-26 MED ORDER — CHLORHEXIDINE GLUCONATE 0.12 % MT SOLN
15.0000 mL | Freq: Once | OROMUCOSAL | Status: AC
  Administered 2020-06-26: 15 mL via OROMUCOSAL
  Filled 2020-06-26: qty 15

## 2020-06-26 MED ORDER — PHENYLEPHRINE 40 MCG/ML (10ML) SYRINGE FOR IV PUSH (FOR BLOOD PRESSURE SUPPORT)
PREFILLED_SYRINGE | INTRAVENOUS | Status: AC
  Filled 2020-06-26: qty 10

## 2020-06-26 MED ORDER — FENTANYL CITRATE (PF) 250 MCG/5ML IJ SOLN
INTRAMUSCULAR | Status: AC
  Filled 2020-06-26: qty 5

## 2020-06-26 MED ORDER — FENTANYL CITRATE (PF) 100 MCG/2ML IJ SOLN
INTRAMUSCULAR | Status: DC | PRN
  Administered 2020-06-26: 50 ug via INTRAVENOUS

## 2020-06-26 MED ORDER — ORAL CARE MOUTH RINSE: 15.0000 mL | Freq: Once | OROMUCOSAL | Status: AC

## 2020-06-26 MED ORDER — ACETAMINOPHEN 500 MG PO TABS
1000.0000 mg | ORAL_TABLET | Freq: Once | ORAL | Status: AC
  Administered 2020-06-26: 1000 mg via ORAL
  Filled 2020-06-26: qty 2

## 2020-06-26 MED ORDER — DEXAMETHASONE SODIUM PHOSPHATE 10 MG/ML IJ SOLN
INTRAMUSCULAR | Status: DC | PRN
  Administered 2020-06-26: 10 mg via INTRAVENOUS

## 2020-06-26 MED ORDER — PHENYLEPHRINE HCL-NACL 10-0.9 MG/250ML-% IV SOLN
INTRAVENOUS | Status: DC | PRN
  Administered 2020-06-26: 50 ug/min via INTRAVENOUS

## 2020-06-26 MED ORDER — ONDANSETRON HCL 4 MG/2ML IJ SOLN
INTRAMUSCULAR | Status: DC | PRN
  Administered 2020-06-26: 4 mg via INTRAVENOUS

## 2020-06-26 MED ORDER — SUCCINYLCHOLINE CHLORIDE 200 MG/10ML IV SOSY
PREFILLED_SYRINGE | INTRAVENOUS | Status: DC | PRN
  Administered 2020-06-26: 100 mg via INTRAVENOUS

## 2020-06-26 MED ORDER — MIDAZOLAM HCL 5 MG/5ML IJ SOLN
INTRAMUSCULAR | Status: DC | PRN
  Administered 2020-06-26: 2 mg via INTRAVENOUS

## 2020-06-26 MED ORDER — ONDANSETRON HCL 4 MG/2ML IJ SOLN
INTRAMUSCULAR | Status: AC
  Filled 2020-06-26: qty 2

## 2020-06-26 MED ORDER — DEXAMETHASONE SODIUM PHOSPHATE 10 MG/ML IJ SOLN
INTRAMUSCULAR | Status: AC
  Filled 2020-06-26: qty 1

## 2020-06-26 MED ORDER — CEFAZOLIN SODIUM-DEXTROSE 2-4 GM/100ML-% IV SOLN
2.0000 g | INTRAVENOUS | Status: AC
  Administered 2020-06-26: 2 g via INTRAVENOUS
  Filled 2020-06-26: qty 100

## 2020-06-26 MED ORDER — LACTATED RINGERS IV SOLN: INTRAVENOUS | Status: DC

## 2020-06-26 MED ORDER — 0.9 % SODIUM CHLORIDE (POUR BTL) OPTIME
TOPICAL | Status: DC | PRN
  Administered 2020-06-26: 1000 mL

## 2020-06-26 MED ORDER — OXYMETAZOLINE HCL 0.05 % NA SOLN
NASAL | Status: AC
  Filled 2020-06-26: qty 30

## 2020-06-26 MED ORDER — LIDOCAINE 2% (20 MG/ML) 5 ML SYRINGE
INTRAMUSCULAR | Status: DC | PRN
  Administered 2020-06-26: 40 mg via INTRAVENOUS

## 2020-06-26 MED ORDER — PHENYLEPHRINE 40 MCG/ML (10ML) SYRINGE FOR IV PUSH (FOR BLOOD PRESSURE SUPPORT)
PREFILLED_SYRINGE | INTRAVENOUS | Status: DC | PRN
  Administered 2020-06-26 (×3): 120 ug via INTRAVENOUS

## 2020-06-26 SURGICAL SUPPLY — 31 items
BALLN PULM 15 16.5 18 X 75CM (BALLOONS)
BALLN PULM 15 16.5 18X75 (BALLOONS)
BALLOON PULM 15 16.5 18X75 (BALLOONS) IMPLANT
CANISTER SUCT 3000ML PPV (MISCELLANEOUS) ×3 IMPLANT
CNTNR URN SCR LID CUP LEK RST (MISCELLANEOUS) IMPLANT
CONT SPEC 4OZ STRL OR WHT (MISCELLANEOUS)
COVER BACK TABLE 60X90IN (DRAPES) ×3 IMPLANT
COVER MAYO STAND STRL (DRAPES) ×3 IMPLANT
COVER WAND RF STERILE (DRAPES) IMPLANT
DECANTER SPIKE VIAL GLASS SM (MISCELLANEOUS) ×3 IMPLANT
DRAPE HALF SHEET 40X57 (DRAPES) ×3 IMPLANT
GAUZE 4X4 16PLY RFD (DISPOSABLE) IMPLANT
GAUZE SPONGE 4X4 12PLY STRL (GAUZE/BANDAGES/DRESSINGS) ×3 IMPLANT
GLOVE BIOGEL PI IND STRL 7.5 (GLOVE) ×1 IMPLANT
GLOVE BIOGEL PI INDICATOR 7.5 (GLOVE) ×2
GLOVE SS BIOGEL STRL SZ 7.5 (GLOVE) ×2 IMPLANT
GLOVE SUPERSENSE BIOGEL SZ 7.5 (GLOVE) ×4
GUARD TEETH (MISCELLANEOUS) IMPLANT
KIT BASIN OR (CUSTOM PROCEDURE TRAY) ×3 IMPLANT
KIT TURNOVER KIT B (KITS) ×3 IMPLANT
NEEDLE HYPO 25GX1X1/2 BEV (NEEDLE) IMPLANT
NS IRRIG 1000ML POUR BTL (IV SOLUTION) ×3 IMPLANT
PAD ARMBOARD 7.5X6 YLW CONV (MISCELLANEOUS) ×6 IMPLANT
PATTIES SURGICAL .5 X3 (DISPOSABLE) IMPLANT
SOL ANTI FOG 6CC (MISCELLANEOUS) IMPLANT
SOLUTION ANTI FOG 6CC (MISCELLANEOUS)
SPECIMEN JAR SMALL (MISCELLANEOUS) ×3 IMPLANT
SURGILUBE 2OZ TUBE FLIPTOP (MISCELLANEOUS) IMPLANT
TOWEL GREEN STERILE FF (TOWEL DISPOSABLE) ×3 IMPLANT
TUBE CONNECTING 12'X1/4 (SUCTIONS) ×1
TUBE CONNECTING 12X1/4 (SUCTIONS) ×2 IMPLANT

## 2020-06-26 NOTE — Interval H&P Note (Signed)
History and Physical Interval Note:  06/26/2020 9:00 AM  Raven Torres  has presented today for surgery, with the diagnosis of LARYNGEAL CANCER.  The various methods of treatment have been discussed with the patient and family. After consideration of risks, benefits and other options for treatment, the patient has consented to  Procedure(s): DIRECT LARYNGOSCOPY AND BIOPSY (N/A) as a surgical intervention.  The patient's history has been reviewed, patient examined, no change in status, stable for surgery.  I have reviewed the patient's chart and labs.  Questions were answered to the patient's satisfaction.     Melony Overly

## 2020-06-26 NOTE — Discharge Instructions (Signed)
Tylenol, ibuprofen as needed for pain or discomfort. Can also use topical throat spray or lozenges for sore throat as needed. Diet as tolerated. Follow-up with Dr. Radene Journey at his office Wednesday week 07/05/2020 at 11:45. Call Dr. Lucia Gaskins if you have any problems or questions [phone]

## 2020-06-26 NOTE — Anesthesia Procedure Notes (Signed)
Procedure Name: Intubation Date/Time: 06/26/2020 9:06 AM Performed by: Renato Shin, CRNA Pre-anesthesia Checklist: Patient identified, Emergency Drugs available, Suction available and Patient being monitored Patient Re-evaluated:Patient Re-evaluated prior to induction Oxygen Delivery Method: Circle system utilized Preoxygenation: Pre-oxygenation with 100% oxygen Induction Type: IV induction Ventilation: Mask ventilation without difficulty Laryngoscope Size: Glidescope and 3 Grade View: Grade I Tube type: MLT Tube size: 6.0 mm Number of attempts: 1 Airway Equipment and Method: Stylet and Video-laryngoscopy Placement Confirmation: ETT inserted through vocal cords under direct vision,  positive ETCO2 and breath sounds checked- equal and bilateral Secured at: 22 cm Tube secured with: Tape Dental Injury: Teeth and Oropharynx as per pre-operative assessment  Comments: Large supraglottic mass noted at 2 o'clock. EZ mask; AOI.

## 2020-06-26 NOTE — Brief Op Note (Signed)
06/26/2020  10:04 AM  PATIENT:  Raven Torres  64 y.o. female  PRE-OPERATIVE DIAGNOSIS:  LARYNGEAL CANCER  POST-OPERATIVE DIAGNOSIS:  LARYNGEAL CANCER  PROCEDURE:  Procedure(s): DIRECT LARYNGOSCOPY AND BIOPSY (N/A)  SURGEON:  Surgeon(s) and Role:    * Rozetta Nunnery, MD - Primary  PHYSICIAN ASSISTANT:   ASSISTANTS: none   ANESTHESIA:   general  EBL:  minimal   BLOOD ADMINISTERED:none  DRAINS: none   LOCAL MEDICATIONS USED:  NONE  SPECIMEN:  Source of Specimen:  laryngeal surface of epiglottis  DISPOSITION OF SPECIMEN:  PATHOLOGY  COUNTS:  YES  TOURNIQUET:  * No tourniquets in log *  DICTATION: .Other Dictation: Dictation Number [id number]  PLAN OF CARE: Discharge to home after PACU  PATIENT DISPOSITION:  PACU - hemodynamically stable.   Delay start of Pharmacological VTE agent (>24hrs) due to surgical blood loss or risk of bleeding: yes

## 2020-06-26 NOTE — Transfer of Care (Signed)
Immediate Anesthesia Transfer of Care Note  Patient: Raven Torres  Procedure(s) Performed: DIRECT LARYNGOSCOPY AND BIOPSY (N/A Mouth)  Patient Location: PACU  Anesthesia Type:General  Level of Consciousness: awake and patient cooperative  Airway & Oxygen Therapy: Patient Spontanous Breathing and Patient connected to face mask oxygen  Post-op Assessment: Report given to RN and Post -op Vital signs reviewed and stable  Post vital signs: Reviewed and stable  Last Vitals:  Vitals Value Taken Time  BP 120/55 06/26/20 1009  Temp    Pulse 81 06/26/20 1010  Resp 22 06/26/20 1010  SpO2 99 % 06/26/20 1010  Vitals shown include unvalidated device data.  Last Pain:  Vitals:   06/26/20 0650  PainSc: 0-No pain      Patients Stated Pain Goal: 6 (58/85/02 7741)  Complications: No complications documented.

## 2020-06-26 NOTE — Anesthesia Postprocedure Evaluation (Signed)
Anesthesia Post Note  Patient: Raven Torres  Procedure(s) Performed: DIRECT LARYNGOSCOPY AND BIOPSY (N/A Mouth)     Patient location during evaluation: PACU Anesthesia Type: General Level of consciousness: awake and alert Pain management: pain level controlled Vital Signs Assessment: post-procedure vital signs reviewed and stable Respiratory status: spontaneous breathing, nonlabored ventilation and respiratory function stable Cardiovascular status: blood pressure returned to baseline and stable Postop Assessment: no apparent nausea or vomiting Anesthetic complications: no   No complications documented.  Last Vitals:  Vitals:   06/26/20 1038 06/26/20 1047  BP: (!) 114/48 (!) 121/52  Pulse: 71 71  Resp: 17 16  Temp:  36.6 C  SpO2: 97% 100%    Last Pain:  Vitals:   06/26/20 1050  PainSc: 0-No pain                 Audry Pili

## 2020-06-27 ENCOUNTER — Encounter (HOSPITAL_COMMUNITY): Payer: Self-pay | Admitting: Otolaryngology

## 2020-06-27 NOTE — Op Note (Signed)
NAME: Raven Torres, Raven Torres MEDICAL RECORD OT:[id number] ACCOUNT [id number] DATE OF BIRTH:May 28, 1956 FACILITY: [facility] LOCATION: [place], [place]  OPERATIVE REPORT  DATE OF PROCEDURE:  06/26/2020  PREOPERATIVE DIAGNOSIS:  Laryngeal tumor.  POSTOPERATIVE DIAGNOSIS:  Laryngeal tumor involving the epiglottis.  OPERATION PERFORMED:  Direct laryngoscopy with biopsy.  SURGEON:  Melony Overly, MD  ANESTHESIA:  General endotracheal.  COMPLICATIONS:  None.  BLOOD LOSS:  Minimal.  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  She is taken to the operating room at this time for direct laryngoscopy and biopsy.  DESCRIPTION OF PROCEDURE:  After undergoing endotracheal anesthesia with #6 endotracheal tube, a direct laryngoscopy was performed.  The base of tongue was clear to evaluation as was the distal portion of the epiglottis and the vallecula area with no  obvious mass in the vallecula or base of tongue.  Next, laryngoscope was used to evaluate the laryngeal surface of epiglottis.  On evaluation of the laryngeal surface of epiglottis, there was a friable ulcerative tumor involving predominantly the  supraglottic area above the vocal cords as the vocal cords were clear to evaluation.  This extended up the laryngeal surface of epiglottis, more on the right side.  Photos were obtained of the vocal cords as well as the supraglottic epiglottic region.   Biopsies were then obtained from the ulcerative area on the right side of the supraglottic region of the epiglottis.  Biopsies were obtained x3.  Cotton pledgets soaked in adrenaline were  placed for hemostasis.  A direct laryngoscopy was also performed  of the piriform sinuses which were clear bilaterally.  This completed the procedure.  The patient was subsequently awoken from anesthesia and transferred to recovery room postoperatively doing well.  DISPOSITION:  The patient will be discharged home later this morning on Tylenol, ibuprofen and topical anesthetic spray and throat lozenges for sore throat.  She will follow up in my office in 10 days to review final pathology report as well as after  review of her tumor at tumor board.  She has a PET scan scheduled.  She will follow up with tumor board and she will be presented at tumor board in 9 days.  HN/NUANCE  D:06/26/2020 T:06/27/2020 JOB:[id number]

## 2020-06-28 ENCOUNTER — Ambulatory Visit
Admission: RE | Admit: 2020-06-28 | Discharge: 2020-06-28 | Disposition: A | Discharge status: Home / Self Care | Payer: Medicare Other | Source: Ambulatory Visit | Attending: Otolaryngology | Admitting: Otolaryngology

## 2020-06-28 ENCOUNTER — Other Ambulatory Visit: Payer: Self-pay

## 2020-06-28 DIAGNOSIS — C321 Malignant neoplasm of supraglottis: Secondary | ICD-10-CM | POA: Insufficient documentation

## 2020-06-28 DIAGNOSIS — K7460 Unspecified cirrhosis of liver: Secondary | ICD-10-CM | POA: Insufficient documentation

## 2020-06-28 DIAGNOSIS — I700 Atherosclerosis of aorta: Secondary | ICD-10-CM | POA: Diagnosis not present

## 2020-06-28 DIAGNOSIS — C100 Malignant neoplasm of vallecula: Secondary | ICD-10-CM | POA: Insufficient documentation

## 2020-06-28 DIAGNOSIS — K766 Portal hypertension: Secondary | ICD-10-CM | POA: Insufficient documentation

## 2020-06-28 DIAGNOSIS — J439 Emphysema, unspecified: Secondary | ICD-10-CM | POA: Diagnosis not present

## 2020-06-28 LAB — GLUCOSE, CAPILLARY: Glucose-Capillary: 81 mg/dL (ref 70–99)

## 2020-06-28 MED ORDER — FLUDEOXYGLUCOSE F - 18 (FDG) INJECTION
7.9000 | Freq: Once | INTRAVENOUS | Status: AC | PRN
  Administered 2020-06-28: 8.46 via INTRAVENOUS

## 2020-06-30 LAB — SURGICAL PATHOLOGY

## 2020-07-05 ENCOUNTER — Encounter (INDEPENDENT_AMBULATORY_CARE_PROVIDER_SITE_OTHER): Payer: Self-pay | Admitting: Otolaryngology

## 2020-07-05 ENCOUNTER — Ambulatory Visit (INDEPENDENT_AMBULATORY_CARE_PROVIDER_SITE_OTHER): Payer: Medicare Other | Admitting: Otolaryngology

## 2020-07-05 ENCOUNTER — Other Ambulatory Visit: Payer: Self-pay

## 2020-07-05 ENCOUNTER — Telehealth: Payer: Self-pay | Admitting: Hematology and Oncology

## 2020-07-05 ENCOUNTER — Other Ambulatory Visit (INDEPENDENT_AMBULATORY_CARE_PROVIDER_SITE_OTHER): Payer: Self-pay

## 2020-07-05 VITALS — Temp 97.3°F

## 2020-07-05 DIAGNOSIS — C329 Malignant neoplasm of larynx, unspecified: Secondary | ICD-10-CM

## 2020-07-05 DIAGNOSIS — Z4889 Encounter for other specified surgical aftercare: Secondary | ICD-10-CM

## 2020-07-05 NOTE — Progress Notes (Signed)
HPI: Raven Torres is a 64 y.o. female who presents 10 days s/p direct laryngoscopy and biopsy of supraglottic mass.  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  PET scan demonstrated supraglottic tumor and right neck node that was positive.  Patient was reviewed at tumor board earlier today and chemoradiation treatment was recommended.  Staged T3N2.  Epiglottic cancer..   Past Medical History:  Diagnosis Date  . Arthritis   . Cataract    removed both eyes   . Colitis   . COPD (chronic obstructive pulmonary disease) ([place])   . Hepatitis C   . Hyperlipidemia   . Hypertension   . Shingles 03/27/2020  . TB (tuberculosis)    treatment    Past Surgical History:  Procedure Laterality Date  . ABDOMINAL HYSTERECTOMY    . CATARACT EXTRACTION, BILATERAL    . COLONOSCOPY    . COLONOSCOPY  04/04/2020  . DIRECT LARYNGOSCOPY N/A 06/26/2020   Procedure: DIRECT LARYNGOSCOPY AND BIOPSY;  Surgeon: Rozetta Nunnery, MD;  Location: [facility];  Service: ENT;  Laterality: N/A;  . KNEE SURGERY Right   . POLYPECTOMY  11/17/2008   HPP x 1    Social History   Socioeconomic History  . Marital status: Married    Spouse name: Not on file  . Number of children: Not on file  . Years of education: Not on file  . Highest education level: Not on file  Occupational History  . Not on file  Tobacco Use  . Smoking status: Current Every Day Smoker    Packs/day: 1.00    Years: 50.00    Pack years: 50.00    Types: Cigarettes    Start date: 36  . Smokeless tobacco: Never Used  . Tobacco comment: 4 cigs a day   Vaping Use  . Vaping Use: Never used  Substance and Sexual Activity  . Alcohol use: No    Alcohol/week: 0.0 standard drinks  . Drug use: No  . Sexual activity: Never  Other Topics Concern  . Not on file  Social History Narrative  . Not on file   Social Determinants of Health   Financial Resource Strain:   . Difficulty of Paying Living Expenses: Not  on file  Food Insecurity:   . Worried About Charity fundraiser in the Last Year: Not on file  . Ran Out of Food in the Last Year: Not on file  Transportation Needs:   . Lack of Transportation (Medical): Not on file  . Lack of Transportation (Non-Medical): Not on file  Physical Activity:   . Days of Exercise per Week: Not on file  . Minutes of Exercise per Session: Not on file  Stress:   . Feeling of Stress : Not on file  Social Connections:   . Frequency of Communication with Friends and Family: Not on file  . Frequency of Social Gatherings with Friends and Family: Not on file  . Attends Religious Services: Not on file  . Active Member of Clubs or Organizations: Not on file  . Attends Archivist Meetings: Not on file  . Marital Status: Not on file   Family History  Problem Relation Age of Onset  . Heart disease Mother   . Cancer Mother   . Melanoma Mother   . Esophageal cancer Father   . Colon cancer Neg Hx   . Colon polyps Neg Hx   . Rectal cancer Neg Hx   . Stomach cancer Neg Hx  Allergies  Allergen Reactions  . Tudorza Pressair [Aclidinium Bromide] Itching  . Amitriptyline Other (See Comments)    Eye spasms per pt  . Codeine Nausea Only  . Tramadol Nausea Only   Prior to Admission medications   Medication Sig Start Date End Date Taking? Authorizing Provider  albuterol (PROVENTIL) (2.5 MG/3ML) 0.083% nebulizer solution Take 3 mLs (2.5 mg total) by nebulization every 6 (six) hours as needed for wheezing or shortness of breath. 05/11/20  Yes Ladell Pier, MD  albuterol (VENTOLIN HFA) 108 (90 Base) MCG/ACT inhaler INHALE 2 PUFFS INTO THE LUNGS EVERY 6 HOURS AS NEEDED FOR WHEEZING OR SHORTNESS OF BREATH Patient taking differently: Inhale 2 puffs into the lungs every 6 (six) hours as needed for wheezing or shortness of breath. INHALE 2 PUFFS INTO THE LUNGS EVERY 6 HOURS AS NEEDED FOR WHEEZING OR SHORTNESS OF BREATH 03/29/20  Yes Magdalen Spatz, NP  aspirin EC  81 MG tablet Take 1 tablet (81 mg total) by mouth daily. 03/11/18  Yes Ladell Pier, MD  atorvastatin (LIPITOR) 40 MG tablet TAKE 1 TABLET(40 MG) BY MOUTH DAILY AT 6 PM Patient taking differently: Take 40 mg by mouth daily.  04/07/20  Yes Ladell Pier, MD  azithromycin (ZITHROMAX Z-PAK) 250 MG tablet 2 tabs PO x 1 then 1 tab daily 05/11/20  Yes Ladell Pier, MD  BEVESPI AEROSPHERE 9-4.8 MCG/ACT AERO INHALE 2 PUFFS INTO THE LUNGS TWICE DAILY Patient taking differently: Inhale 2 puffs into the lungs in the morning and at bedtime.  04/11/20  Yes Mannam, Praveen, MD  buPROPion (WELLBUTRIN) 100 MG tablet TAKE 1 TABLET(100 MG) BY MOUTH TWICE DAILY Patient taking differently: Take 100 mg by mouth 2 (two) times daily.  06/05/20  Yes Ladell Pier, MD  CALCIUM CARBONATE-VIT D-MIN PO Take 1,200 mg by mouth daily.    Yes [provider]  cholecalciferol (VITAMIN D3) 25 MCG (1000 UNIT) tablet Take 1,000 Units by mouth daily.   Yes [provider]  folic acid (FOLVITE) 1 MG tablet Take 1 mg by mouth daily.  08/14/17  Yes [provider]  gabapentin (NEURONTIN) 100 MG capsule Take 2 capsules (200 mg total) by mouth 2 (two) times daily. 03/24/20  Yes Ladell Pier, MD  guaiFENesin (MUCINEX) 600 MG 12 hr tablet Take 600 mg by mouth daily.   Yes [provider]  hydroxychloroquine (PLAQUENIL) 200 MG tablet Take 200 mg by mouth 2 (two) times daily.   Yes [provider]  lisinopril-hydrochlorothiazide (ZESTORETIC) 10-12.5 MG tablet TAKE 1 TABLET BY MOUTH DAILY 03/08/20  Yes Ladell Pier, MD  predniSONE (DELTASONE) 20 MG tablet Take 1 tablet (20 mg total) by mouth daily with breakfast. 05/11/20  Yes Ladell Pier, MD  sulfaSALAzine (AZULFIDINE) 500 MG EC tablet Take 500 mg by mouth 2 (two) times daily.  08/13/17  Yes [provider]  valACYclovir (VALTREX) 1000 MG tablet Take 1 tablet (1,000 mg total) by mouth 3 (three) times daily. 03/24/20   Yes Ladell Pier, MD     Physical Exam: Patient doing well with no hoarseness and no airway problems.  No significant throat pain.   Assessment: S/p direct laryngoscopy and biopsy which was positive for squamous cell carcinoma. 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 She will follow up here 2 to 3 months following completion of radiation chemotherapy treatment.   Radene Journey, MD

## 2020-07-05 NOTE — Telephone Encounter (Signed)
Received a new pt referral from Dr. Lucia Gaskins for laryngeal cancer. Pt has been cld and scheduled to see Dr. Chryl Heck on 12/9 at 8am. Pt aware to arrive 15 minutes early.

## 2020-07-06 ENCOUNTER — Other Ambulatory Visit (INDEPENDENT_AMBULATORY_CARE_PROVIDER_SITE_OTHER): Payer: Self-pay

## 2020-07-06 NOTE — Progress Notes (Signed)
[facility]

## 2020-07-07 NOTE — Progress Notes (Signed)
Oncology Nurse Navigator Documentation  Placed introductory call to new referral patient Gearlene Godsil  Introduced myself as the H&N oncology nurse navigator that works with Dr. Isidore Moos and Dr. Chryl Heck to whom she has been referred by Dr. Lucia Gaskins. She confirmed understanding of referral.  Briefly explained my role as her navigator, provided my contact information.   Confirmed understanding of upcoming appts and [employer] location, explained arrival and registration process.  I explained the purpose of a dental evaluation prior to starting RT. Ms. Joelyn Oms informed me that she does not have any teeth and wears dentures.    I encouraged her to call with questions/concerns as she moves forward with appts and procedures.    She verbalized understanding of information provided, expressed appreciation for my call.   Navigator Initial Assessment . Employment Status: She is retired . Currently on FMLA / STD: no . Living Situation: She lives with her daughter and grandson's . Support System: daughter . PCP: Karle Plumber . PCD: none . Financial Concerns: no . Transportation Needs: maybe towards the end of treatment.  . Sensory Deficits: no . Language Barriers/Interpreter Needed:  no . Ambulation Needs: no . DME Used in Home: no . Psychosocial Needs:  no . Concerns/Needs Understanding Cancer:  addressed/answered by navigator to best of ability . Self-Expressed Needs: no   Harlow Asa RN, BSN, OCN Head & Neck Oncology Nurse [place] [facility] Phone # [phone]  Fax # [phone]

## 2020-07-07 NOTE — Progress Notes (Signed)
Head and Neck Cancer Location of Tumor / Histology:  Squamous cell carcinoma of epiglottis (T3N2)  Patient presented with symptoms of: was referred by her PCP who noticed a gradually enlarging right neck mass over the past 2 months. Patient had a CT scan of her neck on 06/16/2020 that demonstrated a supraglottic mass with enlarged right neck node consistent with supraglottic cancer and metastasis to right neck node.  PET Scan 06/28/2020 IMPRESSION: 1. The supraglottic mass has a maximum SUV of 12.5 and the enlarged right level II lymph node has a maximum SUV of 16.9, both compatible with malignancy. No findings of metastatic disease to the chest, abdomen, pelvis, or regional skeleton. 2. New lingular ground-glass opacity compared to 01/20/2020 with very low-grade activity, probably from alveolitis or low-grade atypical infection. 3. Aortic Atherosclerosis (ICD10-I70.0) and Emphysema (ICD10-J43.9). 4. Airway thickening is present, suggesting bronchitis or reactive airways disease. 5. 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 6.  Prominent stool throughout the colon favors constipation.  CT Scan  06/16/2020 IMPRESSION: --2.0 x 3.2 x 2.5 cm supraglottic laryngeal mass involving the epiglottis and extending along the aryepiglottic folds bilaterally. Tumor extends into the pre-epiglottic fat and likely into the vallecula. Tumor extends into the upper thyroid cartilage bilaterally, abutting and possibly extending through the outer cortex (particularly on the left). 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 ENT consultation is recommended. --3.4 x 2.4 cm enlarged and heterogeneously enhancing right level 2 lymph node compatible with nodal metastatic disease. The enlarged node near completely effaces the right internal jugular vein at this level.  Biopsies revealed:  06/26/2020 FINAL  MICROSCOPIC DIAGNOSIS:  A. EPIGLOTTIS, BIOPSY:  - Focal poorly differentiated carcinoma, see comment.  COMMENT:  Most of the fragments have a dense lymphoid infiltrate with germinal centers. One fragment has poorly differentiated cells at one edge. The cells are positive for cytokeratin 5/6 and negative for CD20. Thus, the immunophenotype is consistent with a squamous cell carcinoma.  Nutrition Status Yes No Comments  Weight changes? [x]  []  ~30 lb over the past 4-5 months  Swallowing concerns? [x]  []  Reports she often has congestion/phlegm from her COPD, and that triggers her gag reflex when she's eating  PEG? []  [x]     Referrals Yes No Comments  Social Work? [x]  []    Dentistry? []  [x]    Swallowing therapy? [x]  []    Nutrition? [x]  []    Med/Onc? [x]  []  07/13/2020 Consult with Dr. Benay Pike   Safety Issues Yes No Comments  Prior radiation? []  [x]    Pacemaker/ICD? []  [x]    Possible current pregnancy? []  [x]  Hysterectomy  Is the patient on methotrexate? []  [x]     Tobacco/Marijuana/Snuff/ETOH use:  Patient has a significant smoking history of 1 to 2 packs/day since she was a teenager.  She has been trying to quit over the past year but continues to smoke.  Denies any significant alcohol use or any illicit drug use.  Past/Anticipated interventions by otolaryngology, if any:  06/26/2020 Dr. Melony Overly DIRECT LARYNGOSCOPY AND BIOPSY   Past/Anticipated interventions by medical oncology, if any:  Scheduled for a consult with Dr. Benay Pike on 07/13/2020   Current Complaints / other details:   She lives with her daughter and grandkids

## 2020-07-09 ENCOUNTER — Other Ambulatory Visit: Payer: Self-pay | Admitting: Pulmonary Disease

## 2020-07-10 ENCOUNTER — Ambulatory Visit
Admission: RE | Admit: 2020-07-10 | Discharge: 2020-07-10 | Disposition: A | Discharge status: Home / Self Care | Payer: Medicare Other | Source: Ambulatory Visit | Attending: Radiation Oncology | Admitting: Radiation Oncology

## 2020-07-10 ENCOUNTER — Telehealth: Payer: Self-pay | Admitting: Internal Medicine

## 2020-07-10 ENCOUNTER — Inpatient Hospital Stay: Payer: Medicaid Other

## 2020-07-10 ENCOUNTER — Encounter: Payer: Self-pay | Admitting: Radiation Oncology

## 2020-07-10 ENCOUNTER — Other Ambulatory Visit: Payer: Self-pay

## 2020-07-10 ENCOUNTER — Other Ambulatory Visit: Payer: Self-pay | Admitting: Pulmonary Disease

## 2020-07-10 VITALS — BP 97/45 | HR 76 | Temp 98.5°F | Resp 19 | Ht 66.0 in | Wt 149.2 lb

## 2020-07-10 DIAGNOSIS — C321 Malignant neoplasm of supraglottis: Secondary | ICD-10-CM

## 2020-07-10 DIAGNOSIS — K7460 Unspecified cirrhosis of liver: Secondary | ICD-10-CM | POA: Insufficient documentation

## 2020-07-10 DIAGNOSIS — K5900 Constipation, unspecified: Secondary | ICD-10-CM | POA: Insufficient documentation

## 2020-07-10 DIAGNOSIS — I700 Atherosclerosis of aorta: Secondary | ICD-10-CM | POA: Diagnosis not present

## 2020-07-10 DIAGNOSIS — Z923 Personal history of irradiation: Secondary | ICD-10-CM | POA: Diagnosis not present

## 2020-07-10 NOTE — Progress Notes (Signed)
ambulatory

## 2020-07-10 NOTE — Progress Notes (Signed)
Radiation Oncology         [phone] ________________________________  Initial outpatient Consultation  Name: Raven Torres MRN: [id number]  Date: 07/10/2020  DOB: 1955-09-17  DG:[id number], Dalbert Batman, MD  Rozetta Nunnery, *   REFERRING PHYSICIAN: Rozetta Nunnery, *  DIAGNOSIS:    ICD-10-CM   1. 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 female who presented to her PCP with  a gradually enlarging right neck mass over the past 2 months. Patient had a CT scan of her neck on 06/16/2020 that demonstrated a supraglottic mass with enlarged right neck node consistent with supraglottic cancer and metastasis to right neck node.  I have personally reviewed her images.  She has been discussed at tumor board.  The consensus is that her disease is T3 vs T4 stage but very possibly T3 despite potential thyroid cartilage invasion.  PET Scan 06/28/2020 IMPRESSION: 1. The supraglottic mass has a maximum SUV of 12.5 and the enlarged right level II lymph node has a maximum SUV of 16.9, both compatible with malignancy. No findings of metastatic disease to the chest, abdomen, pelvis, or regional skeleton. 2. New lingular ground-glass opacity compared to 01/20/2020 with very low-grade activity, probably from alveolitis or low-grade atypical infection. 3. Aortic Atherosclerosis (ICD10-I70.0) and Emphysema (ICD10-J43.9). 4. Airway thickening is present, suggesting bronchitis or reactive airways disease. 5. Hepatic cirrhosis with left periaortic portosystemic collateral vessels indicating portal venous  hypertension. 6.  Prominent stool throughout the colon favors constipation.  CT Scan  06/16/2020 IMPRESSION: --2.0 x 3.2 x 2.5 cm supraglottic laryngeal mass involving the epiglottis and extending along the aryepiglottic folds bilaterally. Tumor extends into the pre-epiglottic fat and likely into the vallecula. Tumor extends into the upper thyroid cartilage bilaterally, abutting and possibly extending through the outer cortex (particularly on the left). 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 ENT consultation is recommended. --3.4 x 2.4 cm enlarged and heterogeneously enhancing right level 2 lymph node compatible with nodal metastatic disease. The enlarged node near completely effaces the right internal jugular vein at this level.  Biopsies revealed:  06/26/2020 FINAL MICROSCOPIC DIAGNOSIS:  A. EPIGLOTTIS, BIOPSY:  - Focal poorly differentiated carcinoma, see comment.  COMMENT:  Most of the fragments have a dense lymphoid infiltrate with germinal centers. One fragment has poorly differentiated cells at one edge. The cells are positive for cytokeratin 5/6 and negative for CD20. Thus, the immunophenotype is consistent with a squamous cell carcinoma.  Nutrition Status Yes No Comments  Weight changes? [x]  []  ~30 lb over the past 4-5 months  Swallowing concerns? [x]  []  Reports she often has congestion/phlegm from her COPD, and that triggers her gag reflex when she's eating  PEG? []  [x]     Referrals Yes No Comments  Social Work? [x]  []    Dentistry? []  [x]    Swallowing therapy? [x]  []    Nutrition? [x]  []    Med/Onc? [x]  []  07/13/2020 Consult with Dr. Benay Pike   Safety Issues Yes No Comments  Prior radiation? []  [x]    Pacemaker/ICD? []  [x]    Possible current pregnancy? []  [x]  Hysterectomy  Is the patient on methotrexate? []  [  x]    Tobacco/Marijuana/Snuff/ETOH use:  Patient has a significant smoking history of 1 to 2 packs/day since she was  a teenager.  She has been trying to quit over the past year but continues to smoke.  Denies any significant alcohol use or any illicit drug use.  Past/Anticipated interventions by medical oncology, if any:  Scheduled for a consult with Dr. Benay Pike on 07/13/2020  Current Complaints / other details:   She lives with her daughter and grandkids   PREVIOUS RADIATION THERAPY: No  PAST MEDICAL HISTORY:  has a past medical history of Arthritis, Cataract, Colitis, COPD (chronic obstructive pulmonary disease) ([place]), Hepatitis C, Hyperlipidemia, Hypertension, Shingles (03/27/2020), and TB (tuberculosis).    PAST SURGICAL HISTORY: Past Surgical History:  Procedure Laterality Date  . ABDOMINAL HYSTERECTOMY    . CATARACT EXTRACTION, BILATERAL    . COLONOSCOPY    . COLONOSCOPY  04/04/2020  . DIRECT LARYNGOSCOPY N/A 06/26/2020   Procedure: DIRECT LARYNGOSCOPY AND BIOPSY;  Surgeon: Rozetta Nunnery, MD;  Location: [place];  Service: ENT;  Laterality: N/A;  . KNEE SURGERY Right   . POLYPECTOMY  11/17/2008   HPP x 1     FAMILY HISTORY: family history includes Cancer in her mother; Esophageal cancer in her father; Heart disease in her mother; Melanoma in her mother.  SOCIAL HISTORY:  reports that she has been smoking cigarettes. She started smoking about 50 years ago. She has a 50.00 pack-year smoking history. She has never used smokeless tobacco. She reports that she does not drink alcohol and does not use drugs.  ALLERGIES: Tudorza pressair [aclidinium bromide], Amitriptyline, Codeine, and Tramadol  MEDICATIONS:  Current Outpatient Medications  Medication Sig Dispense Refill  . albuterol (PROVENTIL) (2.5 MG/3ML) 0.083% nebulizer solution Take 3 mLs (2.5 mg total) by nebulization every 6 (six) hours as needed for wheezing or shortness of breath. 150 mL 1  . albuterol (VENTOLIN HFA) 108 (90 Base) MCG/ACT inhaler INHALE 2 PUFFS INTO THE LUNGS EVERY 6 HOURS AS NEEDED FOR WHEEZING OR  SHORTNESS OF BREATH (Patient taking differently: Inhale 2 puffs into the lungs every 6 (six) hours as needed for wheezing or shortness of breath. INHALE 2 PUFFS INTO THE LUNGS EVERY 6 HOURS AS NEEDED FOR WHEEZING OR SHORTNESS OF BREATH) 8.5 g 3  . aspirin EC 81 MG tablet Take 1 tablet (81 mg total) by mouth daily. 30 tablet 0  . atorvastatin (LIPITOR) 40 MG tablet TAKE 1 TABLET(40 MG) BY MOUTH DAILY AT 6 PM (Patient taking differently: Take 40 mg by mouth daily.) 30 tablet 2  . BEVESPI AEROSPHERE 9-4.8 MCG/ACT AERO INHALE 2 PUFFS INTO THE LUNGS TWICE DAILY (Patient taking differently: Inhale 2 puffs into the lungs in the morning and at bedtime.) 10.7 g 1  . buPROPion (WELLBUTRIN) 100 MG tablet TAKE 1 TABLET(100 MG) BY MOUTH TWICE DAILY (Patient taking differently: Take 100 mg by mouth 2 (two) times daily.) 60 tablet 2  . CALCIUM CARBONATE-VIT D-MIN PO Take 1,200 mg by mouth daily.     . Calcium Carbonate-Vitamin D (CALCIUM-VITAMIN D3 PO) Take 1 tablet by mouth daily.    Marland Kitchen Fexofenadine HCl (MUCINEX ALLERGY PO) Take 1 tablet by mouth daily as needed.    . folic acid (FOLVITE) 1 MG tablet Take 1 mg by mouth daily.   3  . hydroxychloroquine (PLAQUENIL) 200 MG tablet Take 200 mg by mouth 2 (two) times daily.    Marland Kitchen lisinopril-hydrochlorothiazide (ZESTORETIC) 10-12.5 MG tablet TAKE 1 TABLET BY MOUTH DAILY  90 tablet 1  . sulfaSALAzine (AZULFIDINE) 500 MG EC tablet Take 500 mg by mouth 2 (two) times daily.     . valACYclovir (VALTREX) 1000 MG tablet Take 1 tablet (1,000 mg total) by mouth 3 (three) times daily. 21 tablet 0  . Baclofen 5 MG TABS Take 5 mg by mouth at bedtime.    Marland Kitchen dexamethasone (DECADRON) 4 MG tablet Take 2 tablets (8 mg total) by mouth daily. Take daily x 3 days starting the day after cisplatin chemotherapy. Take with food. 30 tablet 1  . diclofenac Sodium (VOLTAREN) 1 % GEL Place 1 application onto the skin 4 (four) times daily as needed.    . gabapentin (NEURONTIN) 100 MG capsule Take 2  capsules (200 mg total) by mouth 2 (two) times daily. 56 capsule 0  . lidocaine-prilocaine (EMLA) cream Apply to affected area once 30 g 3  . LORazepam (ATIVAN) 0.5 MG tablet Take 1 tablet (0.5 mg total) by mouth every 6 (six) hours as needed (Nausea or vomiting). 30 tablet 0  . ondansetron (ZOFRAN) 8 MG tablet Take 1 tablet (8 mg total) by mouth 2 (two) times daily as needed. Start on the third day after cisplatin chemotherapy. 30 tablet 1  . prochlorperazine (COMPAZINE) 10 MG tablet Take 1 tablet (10 mg total) by mouth every 6 (six) hours as needed (Nausea or vomiting). 30 tablet 1   No current facility-administered medications for this encounter.    REVIEW OF SYSTEMS:  Notable for that above.   PHYSICAL EXAM:  height is 5\' 6"  (1.676 m) and weight is 149 lb 4 oz (67.7 kg). Her oral temperature is 98.5 F (36.9 C). Her blood pressure is 97/45 (abnormal) and her pulse is 76. Her respiration is 19 and oxygen saturation is 96%.   General: Alert and oriented, in no acute distress HEENT: Head is normocephalic. Extraocular movements are intact. Oropharynx is notable for no lesions in upper throat. Neck: Neck is notable for 3 cm mass at level 2 right neck Heart: Regular in rate and rhythm with no murmurs, rubs, or gallops. Chest: Clear to auscultation bilaterally, with no rhonchi, wheezes, or rales. Abdomen: Soft, nontender, nondistended, with no rigidity or guarding. Extremities: No cyanosis or edema. Lymphatics: see Neck Exam Skin: No concerning lesions. Musculoskeletal: symmetric strength and muscle tone throughout. Neurologic: Cranial nerves II through XII are grossly intact. No obvious focalities. Speech is fluent. Coordination is intact. Psychiatric: Judgment and insight are intact. Affect is appropriate.   ECOG = 1  0 - Asymptomatic (Fully active, able to carry on all predisease activities without restriction)  1 - Symptomatic but completely ambulatory (Restricted in physically  strenuous activity but ambulatory and able to carry out work of a light or sedentary nature. For example, light housework, office work)  2 - Symptomatic, <50% in bed during the day (Ambulatory and capable of all self care but unable to carry out any work activities. Up and about more than 50% of waking hours)  3 - Symptomatic, >50% in bed, but not bedbound (Capable of only limited self-care, confined to bed or chair 50% or more of waking hours)  4 - Bedbound (Completely disabled. Cannot carry on any self-care. Totally confined to bed or chair)  5 - Death   Eustace Pen MM, Creech RH, Tormey DC, et al. [phone]). "Toxicity and response criteria of the [facility]". [place] Oncol. 5 (6): 649-55   LABORATORY DATA:  Lab Results  Component Value Date   WBC  5.5 06/26/2020   HGB 13.5 06/26/2020   HCT 41.9 06/26/2020   MCV 97.0 06/26/2020   PLT 100 (L) 06/26/2020   CMP     Component Value Date/Time   NA 138 06/26/2020 0658   NA 139 06/02/2020 0845   K 3.5 06/26/2020 0658   CL 104 06/26/2020 0658   CO2 23 06/26/2020 0658   GLUCOSE 105 (H) 06/26/2020 0658   BUN 12 06/26/2020 0658   BUN 12 06/02/2020 0845   CREATININE 0.87 06/26/2020 0658   CREATININE 0.70 02/26/2016 1143   CALCIUM 9.0 06/26/2020 0658   PROT 6.4 (L) 06/26/2020 0658   PROT 6.6 06/02/2020 0845   ALBUMIN 3.0 (L) 06/26/2020 0658   ALBUMIN 3.5 (L) 06/02/2020 0845   AST 30 06/26/2020 0658   ALT 15 06/26/2020 0658   ALKPHOS 44 06/26/2020 0658   BILITOT 0.7 06/26/2020 0658   BILITOT 0.5 06/02/2020 0845   GFRNONAA >60 06/26/2020 0658   GFRNONAA >89 08/23/2014 1506   GFRAA 74 06/02/2020 0845   GFRAA >89 08/23/2014 1506      Lab Results  Component Value Date   TSH 2.220 01/07/2020     RADIOGRAPHY: CT Soft Tissue Neck W Contrast  Result Date: 06/16/2020 CLINICAL DATA:  Neck mass. Lymphadenopathy, neck; pathologically enlarged lymph node on neck ultrasound. Additional history provided by scanning  technologist: Patient reports mass on right side of neck noticed 3-4 days ago. EXAM: CT NECK WITH CONTRAST TECHNIQUE: Multidetector CT imaging of the neck was performed using the standard protocol following the bolus administration of intravenous contrast. CONTRAST:  11mL OMNIPAQUE IOHEXOL 300 MG/ML  SOLN COMPARISON:  Ultrasound of the neck soft tissues 06/13/2020. FINDINGS: Pharynx and larynx: 2.0 x 3.2 x 2.5 cm supraglottic laryngeal mass involving the epiglottis and extending along the aryepiglottic folds bilaterally. The tumor extends into the pre-epiglottic fat and likely into the vallecula. Additionally, the tumor extends into the upper thyroid cartilage bilaterally, abutting and possibly extending through, the outer cortex (particularly on the left (series 5, image 53). The laryngeal airway remains patent. Salivary glands: No inflammation, mass, or stone. Thyroid: Unremarkable. Lymph nodes: Abnormal heterogeneously enhancing and enlarged right level 2 lymph node measuring 3.4 x 2.4 cm (series 10, image 20) (series 5, image 41). No other pathologically enlarged lymph nodes are identified. Vascular: The major vascular structures of the neck are patent. An enlarged right level 2 lymph node near completely effaces the right internal jugular vein. Calcified atherosclerotic plaque within the visualized aortic arch, proximal major branch vessels of the neck, carotid bifurcations and proximal internal carotid arteries. Limited intracranial: No acute intracranial abnormality is identified. Visualized orbits: The right orbit is excluded from the field of view. The left orbit is partially imaged. No visible mass or acute finding. Mastoids and visualized paranasal sinuses: Small left maxillary sinus air-fluid level. Small right maxillary sinus mucous retention cyst. No significant mastoid effusion. Skeleton: No acute bony abnormality or aggressive osseous lesion. A cervical dextrocurvature may be positional. Cervical  spondylosis greatest at C6-C7. Upper chest: Severe emphysema within the imaged lung apices. These results will be called to the ordering clinician or representative by the Radiologist Assistant, and communication documented in the PACS or [employer]. IMPRESSION: 2.0 x 3.2 x 2.5 cm supraglottic laryngeal mass involving the epiglottis and extending along the aryepiglottic folds bilaterally. Tumor extends into the pre-epiglottic fat and likely into the vallecula. Tumor extends into the upper thyroid cartilage bilaterally, abutting and possibly extending through the outer cortex (particularly on  the left). 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 ENT consultation is recommended. 3.4 x 2.4 cm enlarged and heterogeneously enhancing right level 2 lymph node compatible with nodal metastatic disease. The enlarged node near completely effaces the right internal jugular vein at this level. Electronically Signed   By: Kellie Simmering DO   On: 06/16/2020 17:22   NM PET Image Initial (PI) Skull Base To Thigh  Result Date: 06/28/2020 CLINICAL DATA:  Initial treatment strategy for supraglottic malignancy EXAM: NUCLEAR MEDICINE PET SKULL BASE TO THIGH TECHNIQUE: A 0.5 mCi F-18 FDG was injected intravenously. Full-ring PET imaging was performed from the skull base to thigh after the radiotracer. CT data was obtained and used for attenuation correction and anatomic localization. Fasting blood glucose: 81 mg/dl COMPARISON:  Multiple exams, including CT neck 06/16/2020 FINDINGS: Mediastinal blood pool activity: SUV max 2.1 Liver activity: SUV max NA NECK: The anterior supraglottic mass is slightly eccentric to the right with partial extension into the right aryepiglottic fold and pre epiglottic involvement, maximum SUV 12.5, compatible with malignancy. A right level II lymph node measuring 1.4 cm in short axis on image 33 of series 3 has a maximum SUV of 16.9, compatible with malignant  involvement. In addition to the supraglottic mass, there is some low-grade glottic activity which is symmetric and probably physiologic. There is also some physiologic activity in left anterior paravertebral musculature. Incidental CT findings: Bilateral common carotid atherosclerotic calcification. CHEST: Confluent ground-glass opacity filling several adjacent secondary pulmonary lobules in the lingula, measuring 3.7 by 2.7 cm on image 105 of series 3, with low-grade metabolic activity with maximum SUV of 1.5. This was not present on 01/20/2020 and this region appeared normal on that exam, accordingly this is probably inflammatory. Incidental CT findings: Severe centrilobular emphysema. Airway thickening is present, suggesting bronchitis or reactive airways disease. Coronary, aortic arch, and branch vessel atherosclerotic vascular disease. ABDOMEN/PELVIS: No significant abnormal hypermetabolic activity in this region. Incidental CT findings: Nodular hepatic contour favoring cirrhosis. Aortoiliac atherosclerotic vascular disease. Left periaortic portosystemic collateral vessels indicating portal venous hypertension. Vascular calcifications in the renal hila. Prominent stool throughout the colon favors constipation. SKELETON: No significant abnormal hypermetabolic activity in this region. Incidental CT findings: Mild grade 1 degenerative retrolisthesis of L3 on L4. IMPRESSION: 1. The supraglottic mass has a maximum SUV of 12.5 and the enlarged right level II lymph node has a maximum SUV of 16.9, both compatible with malignancy. No findings of metastatic disease to the chest, abdomen, pelvis, or regional skeleton. 2. New lingular ground-glass opacity compared to 01/20/2020 with very low-grade activity, probably from alveolitis or low-grade atypical infection. 3. Aortic Atherosclerosis (ICD10-I70.0) and Emphysema (ICD10-J43.9). 4. Airway thickening is present, suggesting bronchitis or reactive airways disease. 5.  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 6.  Prominent stool throughout the colon favors constipation. Electronically Signed   By: Van Clines M.D.   On: 06/28/2020 16:56      IMPRESSION/PLAN:  This is a delightful patient with head and neck cancer.  She has been discussed at tumor board.  The consensus of the team is to attempt organ preservation and surgery only if needed salvage.  She is enthusiastic about this plan.  She will see medical oncology in the near future to discuss concurrent chemotherapy.  I recommend 7 weeks of definitive radiotherapy for this patient.  We discussed the potential risks, benefits, and side effects of radiotherapy. We talked in detail about acute and  late effects. We discussed that some of the most bothersome acute effects may be mucositis, dysgeusia, salivary changes, skin irritation, hair loss, dehydration, weight loss and fatigue. We talked about late effects which include but are not necessarily limited to dysphagia, hypothyroidism, nerve injury, vascular injury, spinal cord injury, xerostomia, trismus, neck edema, and potential injury to any of the tissues in the head and neck region. No guarantees of treatment were given. A consent form was signed and placed in the patient's medical record. The patient is enthusiastic about proceeding with treatment. I look forward to participating in the patient's care.    Simulation (treatment planning) will take place within the next week  We also discussed that the treatment of head and neck cancer is a multidisciplinary process to maximize treatment outcomes and quality of life. For this reason the following referrals have been or will be made:   Medical oncology to discuss chemotherapy  (patient understands that she will likely need a feeding tube and this would be placed at the same time as Port-A-Cath insertion; this will be scheduled by medical  oncology)     Nutritionist for nutrition support during and after treatment.   Speech language pathology for swallowing and/or speech therapy.   Social work for social support.    Physical therapy due to risk of lymphedema in neck and deconditioning.     On date of service, in total, I spent 65 minutes on this encounter. Patient was seen in person.  __________________________________________   Eppie Gibson, MD

## 2020-07-10 NOTE — Progress Notes (Signed)
Oncology Nurse Navigator Documentation  Met with patient during initial consult with Dr. Isidore Moos.  . Further introduced myself as her Navigator, explained my role as a member of the Care Team. . Provided New Patient Information packet: o Contact information for physician, this navigator, other members of the Care Team o Advance Directive information ([place] blue pamphlet with LCSW insert); provided [facility] AD booklet at his request,  o Fall Prevention Patient [place] sheet o Symptom Management Clinic information o [facility] campus map with highlight of [place] o SLP Information sheet . Provided and discussed educational handouts for PEG and PAC. Marland Kitchen Assisted with post-consult appt scheduling. She will see Dr. Chryl Heck on 12/9 and receive her COVID booster shot after that appointment.  . She verbalized understanding of information provided. . I encouraged them to call with questions/concerns moving forward.  Harlow Asa, RN, BSN, OCN Head & Neck Oncology Nurse [place] [facility] [phone]

## 2020-07-10 NOTE — Telephone Encounter (Signed)
   Raven Torres DOB: April 07, 1956 MRN: [id number]   RIDER WAIVER AND RELEASE OF LIABILITY  For purposes of improving physical access to our facilities, [place] is pleased to partner with third parties to provide [facility] patients or other authorized individuals the option of convenient, on-demand ground transportation services (the [place]") through use of the technology service that enables users to request on-demand ground transportation from independent third-party providers.  By opting to use and accept these [employer], I, the undersigned, hereby agree on behalf of myself, and on behalf of any minor child using the [employer] for whom [name] the parent or legal guardian, as follows:  1. Government social research officer provided to me are provided by independent third-party transportation providers who are not [employer] or employees and who are unaffiliated with [employer]. 2. [place] is neither a transportation carrier nor a common or public carrier. 3. [facility] has no control over the quality or safety of the transportation that occurs as a result of the [employer]. 4. [place] cannot guarantee that any third-party transportation provider will complete any arranged transportation service. 5. [place] makes no representation, warranty, or guarantee regarding the reliability, timeliness, quality, safety, suitability, or availability of any of the Transport Services or that they will be error free. 6. I fully understand that traveling by vehicle involves risks and dangers of serious bodily injury, including permanent disability, paralysis, and death. I agree, on behalf of myself and on behalf of any minor child using the Transport Services for whom [name] the parent or legal guardian, that the entire risk arising out of my use of the [employer] remains solely with me, to the maximum extent permitted under applicable law. 7. The [employer] are provided "as is" and "as available." [place] disclaims all representations and warranties, express, implied or statutory, not expressly set out in these terms, including the implied warranties of merchantability and fitness for a particular purpose. 8. I hereby waive and release [place], its agents, employees, officers, directors, representatives, insurers, attorneys, assigns, successors, subsidiaries, and affiliates from any and all past, present, or future claims, demands, liabilities, actions, causes of action, or suits of any kind directly or indirectly arising from acceptance and use of the [employer]. 9. I further waive and release [place] and its affiliates from all present and future liability and responsibility for any injury or death to persons or damages to property caused by or related to the use of the [employer]. 10. I have read this Waiver and Release of Liability, and I understand the terms used in it and their legal significance. This Waiver is freely and voluntarily given with the understanding that my right (as well as the right of any minor child for whom [name] the parent or legal guardian using the [employer]) to legal recourse against [place] in connection with the [employer] is knowingly surrendered in return for use of these services.   I attest that I read the consent document to Raven Torres, gave Raven Torres the opportunity to ask questions and answered the questions asked (if any). I affirm that Raven Torres then provided consent for she's participation in this program.     Raven Torres

## 2020-07-11 ENCOUNTER — Encounter: Payer: Self-pay | Admitting: *Deleted

## 2020-07-11 NOTE — Progress Notes (Signed)
[place] Work  Initial Assessment   Raven Torres is a 64 y.o. 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  Clinical Social Work was referred by distress screen protocol and radiation oncology for assessment of psychosocial needs.   SDOH (Social Determinants of Health) assessments performed: Yes SDOH Interventions     Most Recent Value  SDOH Interventions  Food Insecurity Interventions Intervention Not Indicated  Housing Interventions Intervention Not Indicated  Transportation Interventions [facility]      Distress Screen completed: Yes ONCBCN DISTRESS SCREENING 07/10/2020  Screening Type Initial Screening  Distress experienced in past week (1-10) 7  Emotional problem type Nervousness/Anxiety  Physical Problem type Pain;Breathing;Loss of appetitie;Skin dry/itchy  Physician notified of physical symptoms Yes  Referral to clinical psychology No  Referral to clinical social work Yes  Referral to dietition Yes  Referral to financial advocate No  Referral to support programs No  Referral to palliative care No      Family/Social Information:  . Housing Arrangement: patient lives with daughter and 2 grandsons (6years and 43 years) . Family members/support persons in your life? Family . Transportation concerns: yes  Patient has already been connected to [employer] team. . Employment: Retired. Income source: Conservation officer, historic buildings . Financial concerns: cost associated with treatment o Type of concern: Transportation . Food access concerns: no . Religious or spiritual practice: unknown  . Medication Concerns: no  . Services Currently in place:  Medicare and medicaid.  Has also been referred to [facility] transportation.  Coping/ Adjustment to diagnosis: . Patient understands treatment plan and what happens next? yes . Concerns about diagnosis and/or treatment: Overwhelmed by information . Patient reported stressors:  Transportation and Adjusting to my illness . Hopes and priorities: "to get through it one day at a time." . Patient enjoys time with family/ friends . Current coping skills/ strengths: Active sense of humor and Capable of independent living    SUMMARY: Current SDOH Barriers:  . Transportation  Clinical Social Work Clinical Goal(s):  Marland Kitchen Referral complete to [facility] transportation.  Contact information given and patient encouraged to call with needs or concerns.  Interventions: . Discussed common feeling and emotions when being diagnosed with cancer, and the importance of support during treatment . Informed patient of the support team roles and support services at [facility] . Provided CSW contact information and encouraged patient to call with any questions or concerns . Provided education and assistance to patient regarding Advance Directives   Follow Up Plan: Patient will contact CSW with any support or resource needs Patient verbalizes understanding of plan: Yes    Laymond Postle P Salena Ortlieb , LCSW  Johnnye Lana, MSW, LCSW, OSW-C Clinical Social Worker [place] [phone]

## 2020-07-12 NOTE — Progress Notes (Unsigned)
[place] NOTE  Patient Care Team: Ladell Pier, MD as PCP - General (Internal Medicine) Debara Pickett Nadean Corwin, MD as PCP - Cardiology (Cardiology)  CHIEF COMPLAINTS/PURPOSE OF CONSULTATION:  Supraglottic cancer   ASSESSMENT & PLAN:  No problem-specific Assessment & Plan notes found for this encounter.  Orders Placed This Encounter  Procedures  . IR Gastrostomy Tube    Standing Status:   Future    Standing Expiration Date:   07/13/2021    Order Specific Question:   Reason for exam:    Answer:   New diagnosis of laryngeal cancer, prep for treatment.    Order Specific Question:   Preferred Imaging Location?    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 Patient arrived to the appointment today with her daughter.  She is not a good historian, needs redirection during any conversation about her dog.  Appeared to have good understanding of what is going on.  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 We have discussed her in the ENT in multidisciplinary conference and there was a question if the T staging was a T3 versus a T4.  But since there is no conclusive evidence of T4 and since the patient also prefers no surgery at this time, I believe it is reasonable to proceed with concurrent chemoradiation.  I have discussed weekly cisplatin concurrent with radiation.   I have discussed that concurrent chemoradiation increases the rate of laryngeal preservation. We have discussed about adverse effects including but not limited to fatigue, nausea, vomiting, cytopenias, nephrotoxicity,  hearing loss, and increased risk of infections, reactivation of tuberculosis including death.  Most common side effects however fatigue, nausea, mucositis and cytopenias.  Given her underlying cirrhosis, baseline platelet count of around 100,000, we may have to monitor CBC weekly. She is agreeable to proceed with concurrent chemoradiation.  IR gastrostomy orders placed.   No need for baseline audiology evaluation since patient declines any hearing deficit. Speech therapy, social work referrals placed already. Pt is edentulous, so no dental evaluation needed.  Thank you for consulting [place] in the care of this patient.  Please do not hesitate to contact us with any additional questions or concerns.  HISTORY OF PRESENTING ILLNESS:  CAPRIA CARTAYA 64 y.o. female is here because of new diagnosis of laryngeal cancer  Chronology  KHYRA VISCUSO is a 64 y.o. female who presents for evaluation of weight loss and enlarged right neck node.  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 She has had a approximate 30 pound weight loss over the past 4 months.  She is having no airway problems or hoarseness.RONALD VINSANT is a 64 y.o. female who presents for evaluation of weight loss and enlarged right neck node. She has had a approximate 30 pound weight loss over the past 4 months.  She has no hoarseness or airway problems. On fiberoptic laryngoscopy she has abnormality of the laryngeal surface of the epiglottis and is taken to the operating room for  direct laryngoscopy and biopsy.  06/16/2020 2.0 x 3.2 x 2.5 cm supraglottic laryngeal mass involving the epiglottis and extending along the aryepiglottic folds bilaterally. Tumor extends into the pre-epiglottic fat and likely into the vallecula. Tumor extends into the upper thyroid cartilage bilaterally,  abutting and possibly extending through the outer cortex (particularly on the left). This almost certainly reflects a supraglottic laryngeal squamous cell carcinoma  06/26/2020, she had laryngoscopy which showed there was a friable ulcerative tumor involving predominantly the  supraglottic area above the vocal cords as the vocal cords were clear to evaluation.  This extended up the laryngeal surface of epiglottis, more on the right side. A direct laryngoscopy was also performed  of the piriform sinuses which were clear bilaterally.  06/28/2020  1. The supraglottic mass has a maximum SUV of 12.5 and the enlarged right level II lymph node has a maximum SUV of 16.9, both compatible with malignancy. No findings of metastatic disease to the chest, abdomen, pelvis, or regional skeleton. 2. New lingular ground-glass opacity compared to 01/20/2020 with very low-grade activity, probably from alveolitis or low-grade atypical infection. 3. Aortic Atherosclerosis (ICD10-I70.0) and Emphysema (ICD10-J43.9). 4. Airway thickening is present, suggesting bronchitis or reactive airways disease. 5. 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 6.  Prominent stool throughout the colon favors constipation.  Her case was discussed in the tumor board, and radiology review suggested that there is no definitive evidence of T4 involvement, hence plan was to consider concurrent chemoradiation and thus medical oncology referral. She is here with her daughter, She is not a good historian. She needs lot of re direction when taking history. She has baseline history of COPD, not oxygen dependent. She is able to perform her all ADL's. She says memory has been an issue, she is forgetful at times. She had TB 3 yrs ago and was treated for this at [facility]. She has RA, takes plaquenil and sulfasalazine  Hep C induced cirrhosis, Hep C cured by Interferon,  cirrhosis well compensated. She is very anxious, lives with her daughter,  She smoked about 1-2 PPD and is now smoking about 2 packs in a week. No alcohol, only when she was very young. She denies any pain in her neck, swallowing, speech or hearing. She is able to drink and eat everything. Baseline cough and SOB with exertion. Baseline constipation. No hearing loss.  Rest of the pertinent 10 point ROS reviewed and negative  MEDICAL HISTORY:  Past Medical History:  Diagnosis Date  . Arthritis   . Cataract    removed both eyes   . Colitis   . COPD (chronic obstructive pulmonary disease) ([place])   . Hepatitis C   . Hyperlipidemia   . Hypertension   . Shingles 03/27/2020  . TB (tuberculosis)    treatment     SURGICAL HISTORY: Past Surgical History:  Procedure Laterality Date  . ABDOMINAL HYSTERECTOMY    . CATARACT EXTRACTION, BILATERAL    . COLONOSCOPY    . COLONOSCOPY  04/04/2020  . DIRECT LARYNGOSCOPY N/A 06/26/2020   Procedure: DIRECT LARYNGOSCOPY AND BIOPSY;  Surgeon: Rozetta Nunnery, MD;  Location: [facility];  Service: ENT;  Laterality: N/A;  . KNEE SURGERY Right   . POLYPECTOMY  11/17/2008   HPP x 1     SOCIAL HISTORY: Social History   Socioeconomic History  . Marital status: Married    Spouse name: Not on file  . Number of children: Not on file  .  Years of education: Not on file  . Highest education level: Not on file  Occupational History  . Not on file  Tobacco Use  . Smoking status: Current Every Day Smoker    Packs/day: 1.00    Years: 50.00    Pack years: 50.00    Types: Cigarettes    Start date: 34  . Smokeless tobacco: Never Used  . Tobacco comment: 4 cigs a day   Vaping Use  . Vaping Use: Never used  Substance and Sexual Activity  . Alcohol use: No    Alcohol/week: 0.0 standard drinks  . Drug use: No  . Sexual activity: Not Currently  Other Topics Concern  . Not on file  Social History Narrative  . Not on file   Social Determinants  of Health   Financial Resource Strain: Not on file  Food Insecurity: No Food Insecurity  . Worried About Charity fundraiser in the Last Year: Never true  . Ran Out of Food in the Last Year: Never true  Transportation Needs: No Transportation Needs  . Lack of Transportation (Medical): No  . Lack of Transportation (Non-Medical): No  Physical Activity: Not on file  Stress: Not on file  Social Connections: Not on file  Intimate Partner Violence: Not on file    FAMILY HISTORY: Family History  Problem Relation Age of Onset  . Heart disease Mother   . Cancer Mother   . Melanoma Mother   . Esophageal cancer Father   . Colon cancer Neg Hx   . Colon polyps Neg Hx   . Rectal cancer Neg Hx   . Stomach cancer Neg Hx     ALLERGIES:  is allergic to tudorza pressair [aclidinium bromide], amitriptyline, codeine, and tramadol.  MEDICATIONS:  Current Outpatient Medications  Medication Sig Dispense Refill  . albuterol (PROVENTIL) (2.5 MG/3ML) 0.083% nebulizer solution Take 3 mLs (2.5 mg total) by nebulization every 6 (six) hours as needed for wheezing or shortness of breath. 150 mL 1  . albuterol (VENTOLIN HFA) 108 (90 Base) MCG/ACT inhaler INHALE 2 PUFFS INTO THE LUNGS EVERY 6 HOURS AS NEEDED FOR WHEEZING OR SHORTNESS OF BREATH (Patient taking differently: Inhale 2 puffs into the lungs every 6 (six) hours as needed for wheezing or shortness of breath. INHALE 2 PUFFS INTO THE LUNGS EVERY 6 HOURS AS NEEDED FOR WHEEZING OR SHORTNESS OF BREATH) 8.5 g 3  . aspirin EC 81 MG tablet Take 1 tablet (81 mg total) by mouth daily. 30 tablet 0  . atorvastatin (LIPITOR) 40 MG tablet TAKE 1 TABLET(40 MG) BY MOUTH DAILY AT 6 PM (Patient taking differently: Take 40 mg by mouth daily.) 30 tablet 2  . Baclofen 5 MG TABS Take 5 mg by mouth at bedtime.    Marland Kitchen BEVESPI AEROSPHERE 9-4.8 MCG/ACT AERO INHALE 2 PUFFS INTO THE LUNGS TWICE DAILY (Patient taking differently: Inhale 2 puffs into the lungs in the morning and at  bedtime.) 10.7 g 1  . buPROPion (WELLBUTRIN) 100 MG tablet TAKE 1 TABLET(100 MG) BY MOUTH TWICE DAILY (Patient taking differently: Take 100 mg by mouth 2 (two) times daily.) 60 tablet 2  . CALCIUM CARBONATE-VIT D-MIN PO Take 1,200 mg by mouth daily.     . Calcium Carbonate-Vitamin D (CALCIUM-VITAMIN D3 PO) Take 1 tablet by mouth daily.    . diclofenac Sodium (VOLTAREN) 1 % GEL Place 1 application onto the skin 4 (four) times daily as needed.    Marland Kitchen Fexofenadine HCl (MUCINEX ALLERGY PO) Take 1  tablet by mouth daily as needed.    . folic acid (FOLVITE) 1 MG tablet Take 1 mg by mouth daily.   3  . gabapentin (NEURONTIN) 100 MG capsule Take 2 capsules (200 mg total) by mouth 2 (two) times daily. 56 capsule 0  . hydroxychloroquine (PLAQUENIL) 200 MG tablet Take 200 mg by mouth 2 (two) times daily.    Marland Kitchen lisinopril-hydrochlorothiazide (ZESTORETIC) 10-12.5 MG tablet TAKE 1 TABLET BY MOUTH DAILY 90 tablet 1  . predniSONE (DELTASONE) 20 MG tablet Take 1 tablet (20 mg total) by mouth daily with breakfast. 5 tablet 0  . sulfaSALAzine (AZULFIDINE) 500 MG EC tablet Take 500 mg by mouth 2 (two) times daily.     . valACYclovir (VALTREX) 1000 MG tablet Take 1 tablet (1,000 mg total) by mouth 3 (three) times daily. 21 tablet 0   No current facility-administered medications for this visit.    PHYSICAL EXAMINATION: ECOG PERFORMANCE STATUS: 0 - Asymptomatic  Vitals:   07/13/20 0801  BP: (!) 126/58  Pulse: 81  Resp: 18  Temp: (!) 96 F (35.6 C)   Filed Weights   07/13/20 0801  Weight: 150 lb (68 kg)    GENERAL:alert, no distress and comfortable SKIN: skin color, texture, turgor are normal, no rashes or significant lesions EYES: normal, conjunctiva are pink and non-injected, sclera clear OROPHARYNX:no exudate, no erythema and lips, buccal mucosa, and tongue normal. Dentures. NECK: supple, thyroid normal size, non-tender, without nodularity LYMPH:  Palpable large right sided LN, painless. LUNGS:  diminished expiratory effort, consistent with COPD, no adventitious sounds HEART: regular rate & rhythm and no murmurs and no lower extremity edema ABDOMEN:abdomen soft, non-tender and normal bowel sounds Musculoskeletal:no cyanosis of digits and no clubbing  PSYCH: alert & oriented x 3 with fluent speech NEURO: no focal motor/sensory deficits  LABORATORY DATA:  I have reviewed the data as listed Lab Results  Component Value Date   WBC 5.5 06/26/2020   HGB 13.5 06/26/2020   HCT 41.9 06/26/2020   MCV 97.0 06/26/2020   PLT 100 (L) 06/26/2020     Chemistry      Component Value Date/Time   NA 138 06/26/2020 0658   NA 139 06/02/2020 0845   K 3.5 06/26/2020 0658   CL 104 06/26/2020 0658   CO2 23 06/26/2020 0658   BUN 12 06/26/2020 0658   BUN 12 06/02/2020 0845   CREATININE 0.87 06/26/2020 0658   CREATININE 0.70 02/26/2016 1143      Component Value Date/Time   CALCIUM 9.0 06/26/2020 0658   ALKPHOS 44 06/26/2020 0658   AST 30 06/26/2020 0658   ALT 15 06/26/2020 0658   BILITOT 0.7 06/26/2020 0658   BILITOT 0.5 06/02/2020 0845       RADIOGRAPHIC STUDIES: I have personally reviewed the radiological images as listed and agreed with the findings in the report. CT Soft Tissue Neck W Contrast  Result Date: 06/16/2020 CLINICAL DATA:  Neck mass. Lymphadenopathy, neck; pathologically enlarged lymph node on neck ultrasound. Additional history provided by scanning technologist: Patient reports mass on right side of neck noticed 3-4 days ago. EXAM: CT NECK WITH CONTRAST TECHNIQUE: Multidetector CT imaging of the neck was performed using the standard protocol following the bolus administration of intravenous contrast. CONTRAST:  55mL OMNIPAQUE IOHEXOL 300 MG/ML  SOLN COMPARISON:  Ultrasound of the neck soft tissues 06/13/2020. FINDINGS: Pharynx and larynx: 2.0 x 3.2 x 2.5 cm supraglottic laryngeal mass involving the epiglottis and extending along the aryepiglottic folds bilaterally. The tumor  extends into the pre-epiglottic fat and likely into the vallecula. Additionally, the tumor extends into the upper thyroid cartilage bilaterally, abutting and possibly extending through, the outer cortex (particularly on the left (series 5, image 53). The laryngeal airway remains patent. Salivary glands: No inflammation, mass, or stone. Thyroid: Unremarkable. Lymph nodes: Abnormal heterogeneously enhancing and enlarged right level 2 lymph node measuring 3.4 x 2.4 cm (series 10, image 20) (series 5, image 41). No other pathologically enlarged lymph nodes are identified. Vascular: The major vascular structures of the neck are patent. An enlarged right level 2 lymph node near completely effaces the right internal jugular vein. Calcified atherosclerotic plaque within the visualized aortic arch, proximal major branch vessels of the neck, carotid bifurcations and proximal internal carotid arteries. Limited intracranial: No acute intracranial abnormality is identified. Visualized orbits: The right orbit is excluded from the field of view. The left orbit is partially imaged. No visible mass or acute finding. Mastoids and visualized paranasal sinuses: Small left maxillary sinus air-fluid level. Small right maxillary sinus mucous retention cyst. No significant mastoid effusion. Skeleton: No acute bony abnormality or aggressive osseous lesion. A cervical dextrocurvature may be positional. Cervical spondylosis greatest at C6-C7. Upper chest: Severe emphysema within the imaged lung apices. These results will be called to the ordering clinician or representative by the Radiologist Assistant, and communication documented in the PACS or [employer]. IMPRESSION: 2.0 x 3.2 x 2.5 cm supraglottic laryngeal mass involving the epiglottis and extending along the aryepiglottic folds bilaterally. Tumor extends into the pre-epiglottic fat and likely into the vallecula. Tumor extends into the upper thyroid cartilage bilaterally, abutting  and possibly extending through the outer cortex (particularly on the left). 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 ENT consultation is recommended. 3.4 x 2.4 cm enlarged and heterogeneously enhancing right level 2 lymph node compatible with nodal metastatic disease. The enlarged node near completely effaces the right internal jugular vein at this level. Electronically Signed   By: Kellie Simmering DO   On: 06/16/2020 17:22   US Soft Tissue Head/Neck (NON-THYROID)  Result Date: 06/13/2020 CLINICAL DATA:  Right neck mass EXAM: ULTRASOUND OF HEAD/NECK SOFT TISSUES TECHNIQUE: Ultrasound examination of the head and neck soft tissues was performed in the area of clinical concern. COMPARISON:  None. FINDINGS: The patient's palpable area of concern in the right cervical region corresponds to a 3.3 x 1.2 x 1.4 cm hypoechoic, heterogeneous mass with some internal and peripheral color Doppler flow. IMPRESSION: The patient's palpable area of concern corresponds to a 3.3 cm mass that is highly suspicious for a pathologically enlarged lymph node. Further evaluation with a contrast enhanced CT of the neck and/or ultrasound-guided biopsy is recommended. Electronically Signed   By: Constance Holster M.D.   On: 06/13/2020 18:39   NM PET Image Initial (PI) Skull Base To Thigh  Result Date: 06/28/2020 CLINICAL DATA:  Initial treatment strategy for supraglottic malignancy EXAM: NUCLEAR MEDICINE PET SKULL BASE TO THIGH TECHNIQUE: A 0.5 mCi F-18 FDG was injected intravenously. Full-ring PET imaging was performed from the skull base to thigh after the radiotracer. CT data was obtained and used for attenuation correction and anatomic localization. Fasting blood glucose: 81 mg/dl COMPARISON:  Multiple exams, including CT neck 06/16/2020 FINDINGS: Mediastinal blood pool activity: SUV max 2.1 Liver activity: SUV max NA NECK: The anterior supraglottic mass is slightly eccentric to the right with  partial extension into the right aryepiglottic fold and pre epiglottic involvement, maximum SUV 12.5, compatible with malignancy.  A right level II lymph node measuring 1.4 cm in short axis on image 33 of series 3 has a maximum SUV of 16.9, compatible with malignant involvement. In addition to the supraglottic mass, there is some low-grade glottic activity which is symmetric and probably physiologic. There is also some physiologic activity in left anterior paravertebral musculature. Incidental CT findings: Bilateral common carotid atherosclerotic calcification. CHEST: Confluent ground-glass opacity filling several adjacent secondary pulmonary lobules in the lingula, measuring 3.7 by 2.7 cm on image 105 of series 3, with low-grade metabolic activity with maximum SUV of 1.5. This was not present on 01/20/2020 and this region appeared normal on that exam, accordingly this is probably inflammatory. Incidental CT findings: Severe centrilobular emphysema. Airway thickening is present, suggesting bronchitis or reactive airways disease. Coronary, aortic arch, and branch vessel atherosclerotic vascular disease. ABDOMEN/PELVIS: No significant abnormal hypermetabolic activity in this region. Incidental CT findings: Nodular hepatic contour favoring cirrhosis. Aortoiliac atherosclerotic vascular disease. Left periaortic portosystemic collateral vessels indicating portal venous hypertension. Vascular calcifications in the renal hila. Prominent stool throughout the colon favors constipation. SKELETON: No significant abnormal hypermetabolic activity in this region. Incidental CT findings: Mild grade 1 degenerative retrolisthesis of L3 on L4. IMPRESSION: 1. The supraglottic mass has a maximum SUV of 12.5 and the enlarged right level II lymph node has a maximum SUV of 16.9, both compatible with malignancy. No findings of metastatic disease to the chest, abdomen, pelvis, or regional skeleton. 2. New lingular ground-glass opacity  compared to 01/20/2020 with very low-grade activity, probably from alveolitis or low-grade atypical infection. 3. Aortic Atherosclerosis (ICD10-I70.0) and Emphysema (ICD10-J43.9). 4. Airway thickening is present, suggesting bronchitis or reactive airways disease. 5. 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 6.  Prominent stool throughout the colon favors constipation. Electronically Signed   By: Van Clines M.D.   On: 06/28/2020 16:56    I have reviewed all pertinent images and pathology reports.\  Pathology  PATHOLOGY SURGICAL PATHOLOGY  CASE: [id number]  PATIENT: Doreatha Martin  Surgical Pathology Report      Clinical History: laryngeal cancer (cm)      FINAL MICROSCOPIC DIAGNOSIS:   A. EPIGLOTTIS, BIOPSY:  - Focal poorly differentiated carcinoma, see comment.   COMMENT:   Most of the fragments have a dense lymphoid infiltrate with germinal  centers. One fragment has poorly differentiated cells at one edge. The  cells are positive for cytokeratin 5/6 and negative for CD20. Thus, the  immunophenotype is consistent with a squamous cell carcinoma. Dr.  Vic Ripper has reviewed the case.    All questions were answered. The patient knows to call the clinic with any problems, questions or concerns. I spent  Over 60 minutes in the care of this patient including H and P, review of medical records, counseling and coordination of care.     Benay Pike, MD 07/13/2020 9:12 AM

## 2020-07-13 ENCOUNTER — Inpatient Hospital Stay: Payer: Medicare Other

## 2020-07-13 ENCOUNTER — Inpatient Hospital Stay: Payer: Medicare Other | Attending: Hematology and Oncology | Admitting: Hematology and Oncology

## 2020-07-13 ENCOUNTER — Other Ambulatory Visit: Payer: Self-pay

## 2020-07-13 ENCOUNTER — Inpatient Hospital Stay: Payer: Medicare Other | Admitting: Nutrition

## 2020-07-13 ENCOUNTER — Encounter: Payer: Self-pay | Admitting: Hematology and Oncology

## 2020-07-13 ENCOUNTER — Other Ambulatory Visit: Payer: Self-pay | Admitting: *Deleted

## 2020-07-13 VITALS — BP 126/58 | HR 81 | Temp 96.0°F | Resp 18 | Ht 66.0 in | Wt 150.0 lb

## 2020-07-13 DIAGNOSIS — Z5111 Encounter for antineoplastic chemotherapy: Secondary | ICD-10-CM | POA: Diagnosis present

## 2020-07-13 DIAGNOSIS — I10 Essential (primary) hypertension: Secondary | ICD-10-CM | POA: Diagnosis not present

## 2020-07-13 DIAGNOSIS — Z808 Family history of malignant neoplasm of other organs or systems: Secondary | ICD-10-CM

## 2020-07-13 DIAGNOSIS — J449 Chronic obstructive pulmonary disease, unspecified: Secondary | ICD-10-CM | POA: Diagnosis not present

## 2020-07-13 DIAGNOSIS — Z809 Family history of malignant neoplasm, unspecified: Secondary | ICD-10-CM

## 2020-07-13 DIAGNOSIS — Z23 Encounter for immunization: Secondary | ICD-10-CM | POA: Diagnosis not present

## 2020-07-13 DIAGNOSIS — Z79899 Other long term (current) drug therapy: Secondary | ICD-10-CM | POA: Insufficient documentation

## 2020-07-13 DIAGNOSIS — F17210 Nicotine dependence, cigarettes, uncomplicated: Secondary | ICD-10-CM | POA: Insufficient documentation

## 2020-07-13 DIAGNOSIS — C321 Malignant neoplasm of supraglottis: Secondary | ICD-10-CM | POA: Insufficient documentation

## 2020-07-13 DIAGNOSIS — M069 Rheumatoid arthritis, unspecified: Secondary | ICD-10-CM | POA: Diagnosis not present

## 2020-07-13 DIAGNOSIS — C329 Malignant neoplasm of larynx, unspecified: Secondary | ICD-10-CM

## 2020-07-13 DIAGNOSIS — K7460 Unspecified cirrhosis of liver: Secondary | ICD-10-CM | POA: Diagnosis not present

## 2020-07-13 MED ORDER — DEXAMETHASONE 4 MG PO TABS: 8.0000 mg | ORAL_TABLET | Freq: Every day | ORAL | 1 refills | Status: DC

## 2020-07-13 MED ORDER — LORAZEPAM 0.5 MG PO TABS: 0.5000 mg | ORAL_TABLET | Freq: Four times a day (QID) | ORAL | 0 refills | Status: DC | PRN

## 2020-07-13 MED ORDER — PROCHLORPERAZINE MALEATE 10 MG PO TABS: 10.0000 mg | ORAL_TABLET | Freq: Four times a day (QID) | ORAL | 1 refills | Status: DC | PRN

## 2020-07-13 MED ORDER — ONDANSETRON HCL 8 MG PO TABS: 8.0000 mg | ORAL_TABLET | Freq: Two times a day (BID) | ORAL | 1 refills | Status: DC | PRN

## 2020-07-13 MED ORDER — LIDOCAINE-PRILOCAINE 2.5-2.5 % EX CREA: TOPICAL_CREAM | CUTANEOUS | 3 refills | Status: DC

## 2020-07-13 NOTE — Addendum Note (Signed)
Addended by: Adaline Sill on: 07/13/2020 10:28 AM   Modules accepted: Orders

## 2020-07-13 NOTE — Addendum Note (Signed)
Addended by: Adaline Sill on: 07/13/2020 09:34 AM   Modules accepted: Orders

## 2020-07-13 NOTE — Progress Notes (Signed)
START ON PATHWAY REGIMEN - Head and Neck     A cycle is every 7 days:     Cisplatin   **Always confirm dose/schedule in your pharmacy ordering system**  Patient Characteristics: Larynx, Preoperative or Nonsurgical Candidate, Stage III - IVB Disease Classification: Larynx AJCC T Category: T3 AJCC 8 Stage Grouping: IVA Therapeutic Status: Preoperative or Nonsurgical Candidate AJCC N Category: cN2a AJCC M Category: M0 Intent of Therapy: Curative Intent, Discussed with Patient

## 2020-07-13 NOTE — Progress Notes (Signed)
Oncology Nurse Navigator Documentation  Met with patient during initial consult with Dr. Chryl Heck. She was accompanied by her daughter [name].  . Further introduced myself as his/their Navigator, explained my role as a member of the Care Team. . Assisted with post-consult appt scheduling. . They verbalized understanding of information provided. . I encouraged them to call with questions/concerns moving forward.  Harlow Asa, RN, BSN, OCN Head & Neck Oncology Nurse [facility] [phone]

## 2020-07-13 NOTE — Progress Notes (Signed)
Telephone consult completed with patient with new diagnosis of laryngeal cancer followed by Dr.Iruku and Dr. Isidore Moos. Patient to receive cisplatin every 7 days along with concurrent radiation therapy.  Past medical history includes colitis, COPD, hepatitis C, hyperlipidemia, shingles, TB, and tobacco.  Medications include Lipitor, Wellbutrin, calcium, vitamin D, Ativan, Compazine, Zofran and prednisone.  Labs were reviewed.  Height: 5 feet 6 inches. Weight: 150 pounds December 9. Patient reports usual body weight of 160 pounds. BMI: 24.21.  Patient reports it has been difficult to eat because she is going to so many doctors appointments. She usually snacks.  She is concerned because someone told her not to eat sugar. She denies nutrition impact symptoms today other than constipation.  She was told to take MiraLAX. Noted patient to be scheduled for feeding tube placement.  Nutrition diagnosis:  Predicted suboptimal energy intake related to laryngeal cancer and associated treatments as evidenced by history or condition for which research shows suboptimal energy intake.  Intervention: Educated about the importance of smaller more frequent meals and snacks consisting of high-protein, high-calorie foods. Educated to adjust textures and consistencies based on tolerance. Reviewed list of high-protein foods. Educated patient on strategies for constipation. Encouraged oral nutrition supplements. I mailed fact sheets to patient along with RD contact information. All questions were answered.  Teach back method used.  Monitoring, evaluation, goals: Patient will tolerate adequate calories and protein to minimize weight loss throughout treatment.  Next visit: To be scheduled with treatment.  **Disclaimer: This note was dictated with voice recognition software. Similar sounding words can inadvertently be transcribed and this note may contain transcription errors which may not have been corrected  upon publication of note.**

## 2020-07-13 NOTE — Progress Notes (Signed)
   Covid-19 Vaccination Clinic  Name:  Raven Torres    MRN: [id number] DOB: 04/13/1956  07/13/2020  [name] was observed post Covid-19 immunization for 15 minutes without incident. She was provided with Vaccine Information Sheet and instruction to access the V-Safe system.   [name] was instructed to call 911 with any severe reactions post vaccine: Marland Kitchen Difficulty breathing  . Swelling of face and throat  . A fast heartbeat  . A bad rash all over body  . Dizziness and weakness   Immunizations Administered    Name Date Dose VIS Date Route   Pfizer COVID-19 Vaccine 07/13/2020  9:57 AM 0.3 mL 05/24/2020 Intramuscular   Manufacturer: [place]   Lot: [id number]   [place]: [address]-7

## 2020-07-14 ENCOUNTER — Encounter: Payer: Self-pay | Admitting: Radiation Oncology

## 2020-07-14 ENCOUNTER — Ambulatory Visit
Admission: RE | Admit: 2020-07-14 | Discharge: 2020-07-14 | Disposition: A | Discharge status: Home / Self Care | Payer: Medicare Other | Source: Ambulatory Visit | Attending: Radiation Oncology | Admitting: Radiation Oncology

## 2020-07-14 ENCOUNTER — Other Ambulatory Visit: Payer: Self-pay

## 2020-07-14 VITALS — BP 113/46 | HR 77 | Temp 98.2°F | Resp 20 | Ht 66.0 in | Wt 148.8 lb

## 2020-07-14 DIAGNOSIS — C321 Malignant neoplasm of supraglottis: Secondary | ICD-10-CM | POA: Diagnosis not present

## 2020-07-14 DIAGNOSIS — Z510 Encounter for antineoplastic radiation therapy: Secondary | ICD-10-CM | POA: Diagnosis not present

## 2020-07-14 MED ORDER — SODIUM CHLORIDE 0.9% FLUSH
10.0000 mL | Freq: Once | INTRAVENOUS | Status: AC
  Administered 2020-07-14: 10 mL via INTRAVENOUS

## 2020-07-14 NOTE — Progress Notes (Signed)
Has armband been applied?  Yes.    Does patient have an allergy to IV contrast dye?: No.   Has patient ever received premedication for IV contrast dye?: No.   Does patient take metformin?: No.  Date of lab work: June 26, 2020 BUN: 12 CR: 0.87  IV site: forearm left, condition patent and no redness  Has IV site been added to flowsheet?  Yes.    Vitals:   07/14/20 1323  BP: (!) 113/46  Pulse: 77  Resp: 20  Temp: 98.2 F (36.8 C)  SpO2: 96%

## 2020-07-17 ENCOUNTER — Telehealth: Payer: Self-pay | Admitting: Hematology and Oncology

## 2020-07-17 NOTE — Telephone Encounter (Signed)
Called pt and left a msg with added appts

## 2020-07-19 DIAGNOSIS — C321 Malignant neoplasm of supraglottis: Secondary | ICD-10-CM | POA: Diagnosis not present

## 2020-07-19 NOTE — Progress Notes (Signed)
Pharmacist Chemotherapy Monitoring - Initial Assessment    Anticipated start date: 07/25/2020   Regimen:  . Are orders appropriate based on the patient's diagnosis, regimen, and cycle? Yes . Does the plan date match the patient's scheduled date? Yes . Is the sequencing of drugs appropriate? Yes . Are the premedications appropriate for the patient's regimen? Yes . Prior Authorization for treatment is: Approved o If applicable, is the correct biosimilar selected based on the patient's insurance? no  Organ Function and Labs: Marland Kitchen Are dose adjustments needed based on the patient's renal function, hepatic function, or hematologic function? Yes . Are appropriate labs ordered prior to the start of patient's treatment? Yes . Other organ system assessment, if indicated: N/A . The following baseline labs, if indicated, have been ordered: cisplatin: K, Mg  Dose Assessment: . Are the drug doses appropriate? Yes . Are the following correct: o Drug concentrations Yes o IV fluid compatible with drug Yes o Administration routes Yes o Timing of therapy Yes . If applicable, does the patient have documented access for treatment and/or plans for port-a-cath placement? no . If applicable, have lifetime cumulative doses been properly documented and assessed? no Lifetime Dose Tracking  No doses have been documented on this patient for the following tracked chemicals: Doxorubicin, Epirubicin, Idarubicin, Daunorubicin, Mitoxantrone, Bleomycin, Oxaliplatin, Carboplatin, Liposomal Doxorubicin  o   Toxicity Monitoring/Prevention: . The patient has the following take home antiemetics prescribed: Ondansetron . The patient has the following take home medications prescribed: N/A . Medication allergies and previous infusion related reactions, if applicable, have been reviewed and addressed. No . The patient's current medication list has been assessed for drug-drug interactions with their chemotherapy regimen. no  significant drug-drug interactions were identified on review.  Order Review: . Are the treatment plan orders signed? Yes . Is the patient scheduled to see a provider prior to their treatment? No  I verify that I have reviewed each item in the above checklist and answered each question accordingly.  Blain Hunsucker D 07/19/2020 10:51 AM

## 2020-07-20 ENCOUNTER — Inpatient Hospital Stay: Payer: Medicare Other

## 2020-07-20 ENCOUNTER — Other Ambulatory Visit: Payer: Self-pay

## 2020-07-23 ENCOUNTER — Ambulatory Visit: Payer: Medicare Other

## 2020-07-24 ENCOUNTER — Other Ambulatory Visit: Payer: Self-pay | Admitting: Radiation Oncology

## 2020-07-24 ENCOUNTER — Ambulatory Visit
Admission: RE | Admit: 2020-07-24 | Discharge: 2020-07-24 | Disposition: A | Discharge status: Home / Self Care | Payer: Medicare Other | Source: Ambulatory Visit | Attending: Radiation Oncology | Admitting: Radiation Oncology

## 2020-07-24 ENCOUNTER — Ambulatory Visit: Payer: Medicare Other | Admitting: Medical

## 2020-07-24 ENCOUNTER — Other Ambulatory Visit: Payer: Medicare Other

## 2020-07-24 ENCOUNTER — Other Ambulatory Visit (HOSPITAL_COMMUNITY)
Admission: RE | Admit: 2020-07-24 | Discharge: 2020-07-24 | Disposition: A | Discharge status: Home / Self Care | Payer: Medicare Other | Source: Ambulatory Visit | Attending: Hematology and Oncology | Admitting: Hematology and Oncology

## 2020-07-24 DIAGNOSIS — C321 Malignant neoplasm of supraglottis: Secondary | ICD-10-CM

## 2020-07-24 DIAGNOSIS — Z01812 Encounter for preprocedural laboratory examination: Secondary | ICD-10-CM | POA: Insufficient documentation

## 2020-07-24 DIAGNOSIS — Z20822 Contact with and (suspected) exposure to covid-19: Secondary | ICD-10-CM | POA: Insufficient documentation

## 2020-07-24 LAB — SARS CORONAVIRUS 2 (TAT 6-24 HRS): SARS Coronavirus 2: NEGATIVE

## 2020-07-24 MED ORDER — LIDOCAINE VISCOUS HCL 2 % MT SOLN: OROMUCOSAL | 4 refills | Status: DC

## 2020-07-24 MED ORDER — SONAFINE EX EMUL
1.0000 "application " | Freq: Two times a day (BID) | CUTANEOUS | Status: DC
  Administered 2020-07-24: 1 via TOPICAL

## 2020-07-24 NOTE — Progress Notes (Signed)
Oncology Nurse Navigator Documentation  To provide support, encouragement and care continuity, met with Ms. Oshima for her initial RT.    I reviewed the 2-step treatment process, answered questions.   Ms. Treptow completed treatment without difficulty, denied questions/concerns.  I reviewed the registration/arrival procedure for subsequent treatments. I encouraged her to call me with questions/concerns as tmts proceed.   I also met with Ms. Sebald to provide PEG education prior to 07/27/20 placement.  Provided port educational handout, showed example, provided guidance for post-surgical dsg removal, site care.  . Using  PEG teaching device   and Teach Back, provided education for PEG use and care, including: hand hygiene, gravity bolus administration of daily water flushes and nutritional supplement, fluids and medications; care of tube insertion site including daily dressing change and cleaning; S&S of infection.   . Ms. Freel correctly verbalized procedures for and provided correct return demonstration of gravity administration of water, dressing change and site care.  . I provided written instructions for PEG flushing/dressing change in support of verbal instruction.   . I provided/described contents of Start of Care Bolus Feeding Kit (3 60 cc syringes, 2 boxes 4x4 drainage sponges, 1 package mesh briefs, 1 roll paper tape, 1 case Osmolite 1.5).  He voiced understanding he is to start using Osmolite per guidance of Nutrition. . She understands I will be available for ongoing PEG support. Provided barium sulfate prep which I obtained from [facility], reviewed instructions which included guidance for today's COVID screening at [address].   Harlow Asa RN, BSN, OCN Head & Neck Oncology Nurse [place] at [facility] Phone # [phone]  Fax # [phone]    Harlow Asa RN, BSN, OCN Head & Neck Oncology Nurse  [place] at [facility] Phone # [phone]  Fax # [phone]

## 2020-07-24 NOTE — Progress Notes (Signed)
Pt here for patient teaching.    Pt given Radiation and You booklet, Managing Acute Radiation Side Effects for Head and Neck Cancer handout, skin care instructions and Sonafine.    Reviewed areas of pertinence such as fatigue, hair loss, mouth changes, skin changes, throat changes, earaches and taste changes .   Pt able to give teach back of to pat skin, use unscented/gentle soap and drink plenty of water,apply Sonafine bid and avoid applying anything to skin within 4 hours of treatment.   Pt demonstrated understanding and verbalizes understanding of information given and will contact nursing with any questions or concerns.    Http://rtanswers.org/treatmentinformation/whattoexpect/index          

## 2020-07-25 ENCOUNTER — Ambulatory Visit: Payer: Medicare Other

## 2020-07-25 ENCOUNTER — Other Ambulatory Visit: Payer: Self-pay

## 2020-07-25 ENCOUNTER — Inpatient Hospital Stay: Payer: Medicare Other

## 2020-07-25 ENCOUNTER — Other Ambulatory Visit: Payer: Self-pay | Admitting: *Deleted

## 2020-07-25 ENCOUNTER — Encounter: Payer: Self-pay | Admitting: Hematology and Oncology

## 2020-07-25 ENCOUNTER — Other Ambulatory Visit: Payer: Self-pay | Admitting: Radiology

## 2020-07-25 ENCOUNTER — Ambulatory Visit
Admission: RE | Admit: 2020-07-25 | Discharge: 2020-07-25 | Disposition: A | Discharge status: Home / Self Care | Payer: Medicare Other | Source: Ambulatory Visit | Attending: Radiation Oncology | Admitting: Radiation Oncology

## 2020-07-25 ENCOUNTER — Inpatient Hospital Stay (HOSPITAL_BASED_OUTPATIENT_CLINIC_OR_DEPARTMENT_OTHER): Payer: Medicare Other | Admitting: Medical

## 2020-07-25 VITALS — BP 124/61 | HR 78 | Temp 98.1°F | Resp 18

## 2020-07-25 DIAGNOSIS — Z5111 Encounter for antineoplastic chemotherapy: Secondary | ICD-10-CM | POA: Diagnosis not present

## 2020-07-25 DIAGNOSIS — C329 Malignant neoplasm of larynx, unspecified: Secondary | ICD-10-CM

## 2020-07-25 DIAGNOSIS — C321 Malignant neoplasm of supraglottis: Secondary | ICD-10-CM

## 2020-07-25 LAB — CBC WITH DIFFERENTIAL/PLATELET
Abs Immature Granulocytes: 0.02 10*3/uL (ref 0.00–0.07)
Basophils Absolute: 0 10*3/uL (ref 0.0–0.1)
Basophils Relative: 1 %
Eosinophils Absolute: 0.2 10*3/uL (ref 0.0–0.5)
Eosinophils Relative: 3 %
HCT: 42 % (ref 36.0–46.0)
Hemoglobin: 13.7 g/dL (ref 12.0–15.0)
Immature Granulocytes: 0 %
Lymphocytes Relative: 20 %
Lymphs Abs: 1.2 10*3/uL (ref 0.7–4.0)
MCH: 30.2 pg (ref 26.0–34.0)
MCHC: 32.6 g/dL (ref 30.0–36.0)
MCV: 92.7 fL (ref 80.0–100.0)
Monocytes Absolute: 0.8 10*3/uL (ref 0.1–1.0)
Monocytes Relative: 12 %
Neutro Abs: 4.1 10*3/uL (ref 1.7–7.7)
Neutrophils Relative %: 64 %
Platelets: 119 10*3/uL — ABNORMAL LOW (ref 150–400)
RBC: 4.53 MIL/uL (ref 3.87–5.11)
RDW: 13.1 % (ref 11.5–15.5)
WBC: 6.3 10*3/uL (ref 4.0–10.5)
nRBC: 0 % (ref 0.0–0.2)

## 2020-07-25 LAB — BASIC METABOLIC PANEL
Anion gap: 7 (ref 5–15)
BUN: 10 mg/dL (ref 8–23)
CO2: 25 mmol/L (ref 22–32)
Calcium: 9.1 mg/dL (ref 8.9–10.3)
Chloride: 105 mmol/L (ref 98–111)
Creatinine, Ser: 0.79 mg/dL (ref 0.44–1.00)
GFR, Estimated: >60 mL/min (ref >60)
Glucose, Bld: 98 mg/dL (ref 70–99)
Potassium: 3.6 mmol/L (ref 3.5–5.1)
Sodium: 137 mmol/L (ref 135–145)

## 2020-07-25 LAB — MAGNESIUM: Magnesium: 1.7 mg/dL (ref 1.7–2.4)

## 2020-07-25 MED ORDER — PALONOSETRON HCL INJECTION 0.25 MG/5ML
INTRAVENOUS | Status: AC
  Filled 2020-07-25: qty 5

## 2020-07-25 MED ORDER — PALONOSETRON HCL INJECTION 0.25 MG/5ML
0.2500 mg | Freq: Once | INTRAVENOUS | Status: AC
  Administered 2020-07-25: 09:00:00 0.25 mg via INTRAVENOUS

## 2020-07-25 MED ORDER — SODIUM CHLORIDE 0.9 % IV SOLN
150.0000 mg | Freq: Once | INTRAVENOUS | Status: AC
  Administered 2020-07-25: 11:00:00 150 mg via INTRAVENOUS
  Filled 2020-07-25: qty 150

## 2020-07-25 MED ORDER — SODIUM CHLORIDE 0.9 % IV SOLN
40.0000 mg/m2 | Freq: Once | INTRAVENOUS | Status: AC
  Administered 2020-07-25: 12:00:00 71 mg via INTRAVENOUS
  Filled 2020-07-25: qty 71

## 2020-07-25 MED ORDER — SODIUM CHLORIDE 0.9 % IV SOLN
Freq: Once | INTRAVENOUS | Status: AC
  Filled 2020-07-25 (×2): qty 10

## 2020-07-25 MED ORDER — SODIUM CHLORIDE 0.9 % IV SOLN
10.0000 mg | Freq: Once | INTRAVENOUS | Status: AC
  Administered 2020-07-25: 11:00:00 10 mg via INTRAVENOUS
  Filled 2020-07-25: qty 10

## 2020-07-25 MED ORDER — SODIUM CHLORIDE 0.9 % IV SOLN
Freq: Once | INTRAVENOUS | Status: AC
  Filled 2020-07-25: qty 250

## 2020-07-25 NOTE — Progress Notes (Signed)
Met w/ pt to introduce myself as her Arboriculturist.  Pt has 2 insurances so copay assistance isn't needed.  I informed her of the [employer], went over what it covers and gave her an expense sheet.  She declined the grant at this time so I gave her my card in case she changes her mind and for any questions or concerns she may have in the future.

## 2020-07-25 NOTE — Progress Notes (Signed)
Nutrition Follow-up:  Patient with laryngeal cancer.  Started radiation on 12/20 and chemotherapy today.    Met with patient during infusion.  Patient reports that is eating and that she is hungry. Reports that she ate fudge and ham, egg and cheese mcmuffin yesterday but can't remember anything else she ate.  Has not tried oral nutrition supplements.  Says that she is having a hard time keeping up with all appointments, feels overwhelmed.  Confirms that she was taught yesterday how to use PEG and take care of site by Nurse Navigator.    PEG to be placed on 12/23  Medications: reviewed  Labs: reviewed  Anthropometrics:   Weight 148 lb 12.8 oz on 12/10   Estimated Energy Needs  Kcals: 2000-2345 Protein: 100-117 g Fluid: > 2 L  NUTRITION DIAGNOSIS: Predicted sub optimal energy intake continues   INTERVENTION:  Encouraged patient to consume high calorie, high protein foods. Samples of boost plus and ensure complete given to patient to try. RD to discuss starting nutrition via tube at visit on 12/28.  Patient verbalized understanding. Reviewed with patient that she will need to flush tube with syringe (60ml) of water daily after placed until nutrition started.    MONITORING, EVALUATION, GOAL: weight trends, intake, tube feeding tolerance   NEXT VISIT: Dec 28 during infusion  Joli B. Allen, RD, LDN Registered Dietitian [phone] (mobile)     

## 2020-07-25 NOTE — Patient Instructions (Addendum)
[facility] Discharge Instructions for Patients Receiving Chemotherapy  Today you received the following chemotherapy agents: cisplatin.  To help prevent nausea and vomiting after your treatment, we encourage you to take your nausea medication as directed.   If you develop nausea and vomiting that is not controlled by your nausea medication, call the clinic.   BELOW ARE SYMPTOMS THAT SHOULD BE REPORTED IMMEDIATELY:  *FEVER GREATER THAN 100.5 F  *CHILLS WITH OR WITHOUT FEVER  NAUSEA AND VOMITING THAT IS NOT CONTROLLED WITH YOUR NAUSEA MEDICATION  *UNUSUAL SHORTNESS OF BREATH  *UNUSUAL BRUISING OR BLEEDING  TENDERNESS IN MOUTH AND THROAT WITH OR WITHOUT PRESENCE OF ULCERS  *URINARY PROBLEMS  *BOWEL PROBLEMS  UNUSUAL RASH Items with * indicate a potential emergency and should be followed up as soon as possible.  Feel free to call the clinic should you have any questions or concerns. The clinic phone number is [phone].  Please show the CHEMO ALERT CARD at check-in to the Emergency Department and triage nurse.  Cisplatin injection What is this medicine? CISPLATIN (SIS pla tin) is a chemotherapy drug. It targets fast dividing cells, like cancer cells, and causes these cells to die. This medicine is used to treat many types of cancer like bladder, ovarian, and testicular cancers. This medicine may be used for other purposes; ask your health care provider or pharmacist if you have questions. COMMON BRAND NAME(S): Platinol, Platinol -AQ What should I tell my health care provider before I take this medicine? They need to know if you have any of these conditions:  eye disease, vision problems  hearing problems  kidney disease  low blood counts, like white cells, platelets, or red blood cells  tingling of the fingers or toes, or other nerve disorder  an unusual or allergic reaction to cisplatin, carboplatin, oxaliplatin, other medicines, foods, dyes, or  preservatives  pregnant or trying to get pregnant  breast-feeding How should I use this medicine? This drug is given as an infusion into a vein. It is administered in a hospital or clinic by a specially trained health care professional. Talk to your pediatrician regarding the use of this medicine in children. Special care may be needed. Overdosage: If you think you have taken too much of this medicine contact a poison control center or emergency room at once. NOTE: This medicine is only for you. Do not share this medicine with others. What if I miss a dose? It is important not to miss a dose. Call your doctor or health care professional if you are unable to keep an appointment. What may interact with this medicine? This medicine may interact with the following medications:  foscarnet  certain antibiotics like amikacin, gentamicin, neomycin, polymyxin B, streptomycin, tobramycin, vancomycin This list may not describe all possible interactions. Give your health care provider a list of all the medicines, herbs, non-prescription drugs, or dietary supplements you use. Also tell them if you smoke, drink alcohol, or use illegal drugs. Some items may interact with your medicine. What should I watch for while using this medicine? Your condition will be monitored carefully while you are receiving this medicine. You will need important blood work done while you are taking this medicine. This drug may make you feel generally unwell. This is not uncommon, as chemotherapy can affect healthy cells as well as cancer cells. Report any side effects. Continue your course of treatment even though you feel ill unless your doctor tells you to stop. This medicine may increase your   risk of getting an infection. Call your healthcare professional for advice if you get a fever, chills, or sore throat, or other symptoms of a cold or flu. Do not treat yourself. Try to avoid being around people who are sick. Avoid taking  medicines that contain aspirin, acetaminophen, ibuprofen, naproxen, or ketoprofen unless instructed by your healthcare professional. These medicines may hide a fever. This medicine may increase your risk to bruise or bleed. Call your doctor or health care professional if you notice any unusual bleeding. Be careful brushing and flossing your teeth or using a toothpick because you may get an infection or bleed more easily. If you have any dental work done, tell your dentist you are receiving this medicine. Do not become pregnant while taking this medicine or for 14 months after stopping it. Women should inform their healthcare professional if they wish to become pregnant or think they might be pregnant. Men should not father a child while taking this medicine and for 11 months after stopping it. There is potential for serious side effects to an unborn child. Talk to your healthcare professional for more information. Do not breast-feed an infant while taking this medicine. This medicine has caused ovarian failure in some women. This medicine may make it more difficult to get pregnant. Talk to your healthcare professional if you are concerned about your fertility. This medicine has caused decreased sperm counts in some men. This may make it more difficult to father a child. Talk to your healthcare professional if you are concerned about your fertility. Drink fluids as directed while you are taking this medicine. This will help protect your kidneys. Call your doctor or health care professional if you get diarrhea. Do not treat yourself. What side effects may I notice from receiving this medicine? Side effects that you should report to your doctor or health care professional as soon as possible:  allergic reactions like skin rash, itching or hives, swelling of the face, lips, or tongue  blurred vision  changes in vision  decreased hearing or ringing of the ears  nausea, vomiting  pain, redness, or  irritation at site where injected  pain, tingling, numbness in the hands or feet  signs and symptoms of bleeding such as bloody or black, tarry stools; red or dark brown urine; spitting up blood or brown material that looks like coffee grounds; red spots on the skin; unusual bruising or bleeding from the eyes, gums, or nose  signs and symptoms of infection like fever; chills; cough; sore throat; pain or trouble passing urine  signs and symptoms of kidney injury like trouble passing urine or change in the amount of urine  signs and symptoms of low red blood cells or anemia such as unusually weak or tired; feeling faint or lightheaded; falls; breathing problems Side effects that usually do not require medical attention (report to your doctor or health care professional if they continue or are bothersome):  loss of appetite  mouth sores  muscle cramps This list may not describe all possible side effects. Call your doctor for medical advice about side effects. You may report side effects to FDA at 1-800-FDA-1088. Where should I keep my medicine? This drug is given in a hospital or clinic and will not be stored at home. NOTE: This sheet is a summary. It may not cover all possible information. If you have questions about this medicine, talk to your doctor, pharmacist, or health care provider.  2020 Elsevier/Gold Standard (2018-07-17 15:59:17)

## 2020-07-25 NOTE — Progress Notes (Signed)
Add Portacath order

## 2020-07-26 ENCOUNTER — Ambulatory Visit
Admission: RE | Admit: 2020-07-26 | Discharge: 2020-07-26 | Disposition: A | Discharge status: Home / Self Care | Payer: Medicare Other | Source: Ambulatory Visit | Attending: Radiation Oncology | Admitting: Radiation Oncology

## 2020-07-26 ENCOUNTER — Telehealth: Payer: Self-pay | Admitting: *Deleted

## 2020-07-26 ENCOUNTER — Other Ambulatory Visit: Payer: Self-pay | Admitting: Radiology

## 2020-07-26 DIAGNOSIS — C321 Malignant neoplasm of supraglottis: Secondary | ICD-10-CM | POA: Diagnosis not present

## 2020-07-26 NOTE — Telephone Encounter (Signed)
Called pt earlier this am & she was doing OK.  She reports that she slept well & denied any c/o's.  She seemed a little confused about her meds & was having trouble finding them so told her I would call back to go over them.   Called pt back this pm & she found her meds & discussed how to take.  Informed to call & talk with RN/LPN if she has further concerns. She expressed appreciation & understanding.

## 2020-07-26 NOTE — Telephone Encounter (Signed)
-----   Message from Sinda Du, RN sent at 07/25/2020  3:59 PM EST ----- Regarding: Dr. Chryl Heck - 1st chemo f/u 1st chemo f/u

## 2020-07-27 ENCOUNTER — Other Ambulatory Visit: Payer: Self-pay | Admitting: Hematology and Oncology

## 2020-07-27 ENCOUNTER — Ambulatory Visit: Payer: Medicare Other

## 2020-07-27 ENCOUNTER — Other Ambulatory Visit: Payer: Self-pay

## 2020-07-27 ENCOUNTER — Telehealth: Payer: Self-pay | Admitting: Medical

## 2020-07-27 ENCOUNTER — Ambulatory Visit (HOSPITAL_COMMUNITY)
Admission: RE | Admit: 2020-07-27 | Discharge: 2020-07-27 | Disposition: A | Discharge status: Home / Self Care | Payer: Medicare Other | Source: Ambulatory Visit | Attending: Hematology and Oncology | Admitting: Hematology and Oncology

## 2020-07-27 ENCOUNTER — Ambulatory Visit (HOSPITAL_COMMUNITY)
Admission: RE | Admit: 2020-07-27 | Discharge: 2020-07-27 | Disposition: A | Discharge status: Home / Self Care | Payer: Medicare Other | Source: Ambulatory Visit

## 2020-07-27 ENCOUNTER — Encounter (HOSPITAL_COMMUNITY): Payer: Self-pay

## 2020-07-27 DIAGNOSIS — C321 Malignant neoplasm of supraglottis: Secondary | ICD-10-CM | POA: Diagnosis present

## 2020-07-27 DIAGNOSIS — Z888 Allergy status to other drugs, medicaments and biological substances status: Secondary | ICD-10-CM | POA: Diagnosis not present

## 2020-07-27 DIAGNOSIS — F17210 Nicotine dependence, cigarettes, uncomplicated: Secondary | ICD-10-CM | POA: Diagnosis not present

## 2020-07-27 DIAGNOSIS — Z809 Family history of malignant neoplasm, unspecified: Secondary | ICD-10-CM | POA: Insufficient documentation

## 2020-07-27 DIAGNOSIS — Z7982 Long term (current) use of aspirin: Secondary | ICD-10-CM | POA: Diagnosis not present

## 2020-07-27 DIAGNOSIS — Z79899 Other long term (current) drug therapy: Secondary | ICD-10-CM | POA: Insufficient documentation

## 2020-07-27 DIAGNOSIS — Z885 Allergy status to narcotic agent status: Secondary | ICD-10-CM | POA: Insufficient documentation

## 2020-07-27 DIAGNOSIS — Z800 Family history of malignant neoplasm of digestive organs: Secondary | ICD-10-CM | POA: Diagnosis not present

## 2020-07-27 DIAGNOSIS — C329 Malignant neoplasm of larynx, unspecified: Secondary | ICD-10-CM

## 2020-07-27 HISTORY — PX: IR GASTROSTOMY TUBE MOD SED: IMG625

## 2020-07-27 HISTORY — PX: IR IMAGING GUIDED PORT INSERTION: IMG5740

## 2020-07-27 LAB — CBC WITH DIFFERENTIAL/PLATELET
Abs Immature Granulocytes: 0.03 10*3/uL (ref 0.00–0.07)
Basophils Absolute: 0 10*3/uL (ref 0.0–0.1)
Basophils Relative: 0 %
Eosinophils Absolute: 0 10*3/uL (ref 0.0–0.5)
Eosinophils Relative: 0 %
HCT: 40.4 % (ref 36.0–46.0)
Hemoglobin: 13.2 g/dL (ref 12.0–15.0)
Immature Granulocytes: 0 %
Lymphocytes Relative: 7 %
Lymphs Abs: 0.6 10*3/uL — ABNORMAL LOW (ref 0.7–4.0)
MCH: 31.1 pg (ref 26.0–34.0)
MCHC: 32.7 g/dL (ref 30.0–36.0)
MCV: 95.3 fL (ref 80.0–100.0)
Monocytes Absolute: 0.5 10*3/uL (ref 0.1–1.0)
Monocytes Relative: 5 %
Neutro Abs: 7.7 10*3/uL (ref 1.7–7.7)
Neutrophils Relative %: 88 %
Platelets: 143 10*3/uL — ABNORMAL LOW (ref 150–400)
RBC: 4.24 MIL/uL (ref 3.87–5.11)
RDW: 13.4 % (ref 11.5–15.5)
WBC: 8.9 10*3/uL (ref 4.0–10.5)
nRBC: 0 % (ref 0.0–0.2)

## 2020-07-27 LAB — PROTIME-INR
INR: 1 (ref 0.8–1.2)
Prothrombin Time: 13.2 seconds (ref 11.4–15.2)

## 2020-07-27 MED ORDER — MIDAZOLAM HCL 2 MG/2ML IJ SOLN
INTRAMUSCULAR | Status: AC | PRN
  Administered 2020-07-27: 0.5 mg via INTRAVENOUS
  Administered 2020-07-27: 1 mg via INTRAVENOUS
  Administered 2020-07-27 (×2): 0.5 mg via INTRAVENOUS
  Administered 2020-07-27: 1 mg via INTRAVENOUS
  Administered 2020-07-27: 0.5 mg via INTRAVENOUS
  Administered 2020-07-27: 1 mg via INTRAVENOUS

## 2020-07-27 MED ORDER — LIDOCAINE-EPINEPHRINE 1 %-1:100000 IJ SOLN
INTRAMUSCULAR | Status: AC
  Filled 2020-07-27: qty 1

## 2020-07-27 MED ORDER — FENTANYL CITRATE (PF) 100 MCG/2ML IJ SOLN
INTRAMUSCULAR | Status: AC
  Filled 2020-07-27: qty 4

## 2020-07-27 MED ORDER — IOHEXOL 300 MG/ML  SOLN
50.0000 mL | Freq: Once | INTRAMUSCULAR | Status: AC | PRN
  Administered 2020-07-27: 20 mL

## 2020-07-27 MED ORDER — LIDOCAINE HCL (PF) 1 % IJ SOLN
INTRAMUSCULAR | Status: AC | PRN
  Administered 2020-07-27: 10 mL via INTRADERMAL

## 2020-07-27 MED ORDER — GLUCAGON HCL (RDNA) 1 MG IJ SOLR
INTRAMUSCULAR | Status: AC | PRN
  Administered 2020-07-27: .5 mg via INTRAVENOUS

## 2020-07-27 MED ORDER — HEPARIN SOD (PORK) LOCK FLUSH 100 UNIT/ML IV SOLN
INTRAVENOUS | Status: AC
  Filled 2020-07-27: qty 5

## 2020-07-27 MED ORDER — SODIUM CHLORIDE 0.9 % IV SOLN: INTRAVENOUS | Status: DC

## 2020-07-27 MED ORDER — HEPARIN SOD (PORK) LOCK FLUSH 100 UNIT/ML IV SOLN
INTRAVENOUS | Status: AC | PRN
  Administered 2020-07-27: 500 [IU] via INTRAVENOUS

## 2020-07-27 MED ORDER — CEFAZOLIN SODIUM-DEXTROSE 2-4 GM/100ML-% IV SOLN: 2.0000 g | INTRAVENOUS | Status: AC

## 2020-07-27 MED ORDER — MIDAZOLAM HCL 2 MG/2ML IJ SOLN
INTRAMUSCULAR | Status: AC
  Filled 2020-07-27: qty 6

## 2020-07-27 MED ORDER — FENTANYL CITRATE (PF) 100 MCG/2ML IJ SOLN
INTRAMUSCULAR | Status: AC | PRN
  Administered 2020-07-27 (×4): 50 ug via INTRAVENOUS

## 2020-07-27 MED ORDER — LIDOCAINE-EPINEPHRINE 1 %-1:100000 IJ SOLN
INTRAMUSCULAR | Status: AC | PRN
  Administered 2020-07-27 (×2): 10 mL via INTRADERMAL

## 2020-07-27 MED ORDER — CEFAZOLIN SODIUM-DEXTROSE 2-4 GM/100ML-% IV SOLN
INTRAVENOUS | Status: AC
  Administered 2020-07-27: 10:00:00 2 g via INTRAVENOUS
  Filled 2020-07-27: qty 100

## 2020-07-27 MED ORDER — LIDOCAINE HCL 1 % IJ SOLN
INTRAMUSCULAR | Status: AC
  Filled 2020-07-27: qty 20

## 2020-07-27 MED ORDER — GLUCAGON HCL RDNA (DIAGNOSTIC) 1 MG IJ SOLR
INTRAMUSCULAR | Status: AC
  Filled 2020-07-27: qty 1

## 2020-07-27 NOTE — Sedation Documentation (Signed)
Port a cath placement complete. Patient being prepped for percutaneous gastrostomy tube placement.

## 2020-07-27 NOTE — Progress Notes (Addendum)
Low wall suction discontinued from peg tube, patient getting ready for d/c home. Abdominal Binder placed on patient at this time.   Assisted patient to dress for d/c home.

## 2020-07-27 NOTE — Telephone Encounter (Signed)
Scheduled per 12/21 los, patient has been called and voicemail was left regarding 12/28 appointments.

## 2020-07-27 NOTE — Procedures (Signed)
Interventional Radiology Procedure Note  Procedure: RT IJ POWER PORT 20 FR PULL THRU GTUBE    Complications: None  Estimated Blood Loss:  MIN  Findings: PORT TIP SVCRA  GTUBE CONFIRMED IN THE STOMACH    M. TREVOR Nahara Dona, MD    

## 2020-07-27 NOTE — Discharge Instructions (Signed)
Patient may take her "chemo pill" tonight with small sip of water. Resume all other meds tomorrow after 11:00am, Dec.24th. Patient may have ice chips only for the first 24 hours, until 11:00 am on 07/28/20.  After 11:00 on the 24th patient may advance diet as tolerated.  Start with liquids and if tolerated may progress to more solid food.  Avoid greasy foods.  Please change the peg tube dressing on the abdomen every day, Cleanse site with soap and water, dress with triple-antibiotic ointment, and cover with gauze for 7 days. Do not use scissors near the catheter. Please follow the peg tube instructions & teaching guidelines provided by Harlow Asa, she is your Oncology Nurse Navigator and can provide further assistance regarding the peg tube.   Leave the port a cath dressing in place for 24 hours, your may remove after 24 hours.  Another dressing over the site is NOT required.  If you were given a numbing cream for the port site to use before chemo treatments please DO NOT use this for 2 weeks, the port site must heal first before using this cream.  For questions concerns please contact your doctor at the cancer center. [phone]  Gastrostomy Tube Placement, Care After This sheet gives you information about how to care for yourself after your procedure. Your health care provider may also give you more specific instructions. If you have problems or questions, contact your health care provider. What can I expect after the procedure? After the procedure, it is common to have: Mild pain in your abdomen. A small amount of blood-tinged fluid leaking from the replacement site. Follow these instructions at home:  You may resume your normal level of activity. You may resume your normal feedings. Wash your hands before and after caring for your gastrostomy tube. Check the skin around the tube site insertion for redness, a rash, swelling, drainage, or extra tissue growth. If you notice any of these,  call your health care provider. Care for your gastrostomy tube as directed by your health care provider. Contact a health care provider if: You have a fever or chills. You have redness or irritation near the insertion site. You continue to have pain in the abdomen or leaking around your gastrostomy tube. Get help right away if: You develop bleeding or a lot of discharge around the tube. You have severe pain in the abdomen. Your new tube is not working properly. You are unable to get feedings into the tube. Your tube comes out for any reason. Summary Follow specific instructions as told by your health care provider. If you have problems or questions, contact your health care provider. After the procedure you may resume your normal level of activity and normal feedings. Care for your gastrostomy tube as you did before, or as directed by your health care provider. This information is not intended to replace advice given to you by your health care provider. Make sure you discuss any questions you have with your health care provider. Document Revised: 08/27/2017 Document Reviewed: 08/27/2017 Elsevier Patient Education  [place] Insertion, Care After This sheet gives you information about how to care for yourself after your procedure. Your health care provider may also give you more specific instructions. If you have problems or questions, contact your health care provider. What can I expect after the procedure? After the procedure, it is common to have: Discomfort at the port insertion site. Bruising on the skin over the port. This should improve over  3-4 days. Follow these instructions at home: [facility] care After your port is placed, you will get a manufacturer's information card. The card has information about your port. Keep this card with you at all times. Take care of the port as told by your health care provider. Ask your health care provider if you or a family  member can get training for taking care of the port at home. A home health care nurse may also take care of the port. Make sure to remember what type of port you have. Incision care     Follow instructions from your health care provider about how to take care of your port insertion site. Make sure you: Wash your hands with soap and water before and after you change your bandage (dressing). If soap and water are not available, use hand sanitizer. Change your dressing as told by your health care provider. Leave stitches (sutures), skin glue, or adhesive strips in place. These skin closures may need to stay in place for 2 weeks or longer. If adhesive strip edges start to loosen and curl up, you may trim the loose edges. Do not remove adhesive strips completely unless your health care provider tells you to do that. Check your port insertion site every day for signs of infection. Check for: Redness, swelling, or pain. Fluid or blood. Warmth. Pus or a bad smell. Activity Return to your normal activities as told by your health care provider. Ask your health care provider what activities are safe for you. Do not lift anything that is heavier than 10 lb (4.5 kg), or the limit that you are told, until your health care provider says that it is safe. General instructions Take over-the-counter and prescription medicines only as told by your health care provider. Do not take baths, swim, or use a hot tub until your health care provider approves. Ask your health care provider if you may take showers. You may only be allowed to take sponge baths. Do not drive for 24 hours if you were given a sedative during your procedure. Wear a medical alert bracelet in case of an emergency. This will tell any health care providers that you have a port. Keep all follow-up visits as told by your health care provider. This is important. Contact a health care provider if: You cannot flush your port with saline as directed,  or you cannot draw blood from the port. You have a fever or chills. You have redness, swelling, or pain around your port insertion site. You have fluid or blood coming from your port insertion site. Your port insertion site feels warm to the touch. You have pus or a bad smell coming from the port insertion site. Get help right away if: You have chest pain or shortness of breath. You have bleeding from your port that you cannot control. Summary Take care of the port as told by your health care provider. Keep the manufacturer's information card with you at all times. Change your dressing as told by your health care provider. Contact a health care provider if you have a fever or chills or if you have redness, swelling, or pain around your port insertion site. Keep all follow-up visits as told by your health care provider. This information is not intended to replace advice given to you by your health care provider. Make sure you discuss any questions you have with your health care provider. Document Revised: 02/17/2018 Document Reviewed: 02/17/2018 Elsevier Patient Education  [place]. Moderate Conscious  Sedation, Adult, Care After These instructions provide you with information about caring for yourself after your procedure. Your health care provider may also give you more specific instructions. Your treatment has been planned according to current medical practices, but problems sometimes occur. Call your health care provider if you have any problems or questions after your procedure. What can I expect after the procedure? After your procedure, it is common: To feel sleepy for several hours. To feel clumsy and have poor balance for several hours. To have poor judgment for several hours. To vomit if you eat too soon. Follow these instructions at home: For at least 24 hours after the procedure:  Do not: Participate in activities where you could fall or become injured. Drive. Use  heavy machinery. Drink alcohol. Take sleeping pills or medicines that cause drowsiness. Make important decisions or sign legal documents. Take care of children on your own. Rest. Eating and drinking Follow the diet recommended by your health care provider. If you vomit: Drink water, juice, or soup when you can drink without vomiting. Make sure you have little or no nausea before eating solid foods. General instructions Have a responsible adult stay with you until you are awake and alert. Take over-the-counter and prescription medicines only as told by your health care provider. If you smoke, do not smoke without supervision. Keep all follow-up visits as told by your health care provider. This is important. Contact a health care provider if: You keep feeling nauseous or you keep vomiting. You feel light-headed. You develop a rash. You have a fever. Get help right away if: You have trouble breathing. This information is not intended to replace advice given to you by your health care provider. Make sure you discuss any questions you have with your health care provider. Document Revised: 07/04/2017 Document Reviewed: 11/11/2015 Elsevier Patient Education  2020 [employer].

## 2020-07-27 NOTE — Consult Note (Signed)
Chief Complaint: Patient was seen in consultation today for Port-A-Cath and gastrostomy tube placements  Referring Physician(s): Iruku,Praveena  Supervising Physician: Daryll Brod  Patient Status: [facility] - Out-pt  History of Present Illness: Raven Torres is a 64 y.o. female smoker with history of COPD, hepatitis C/cirrhosis, hypertension, hyperlipidemia, prior TB 3 years ago and now with newly diagnosed laryngeal/supraglottic carcinoma. She presents today for both Port-A-Cath and gastrostomy tube placements prior to planned chemoradiation.  Past Medical History:  Diagnosis Date  . Arthritis   . Cataract    removed both eyes   . Colitis   . COPD (chronic obstructive pulmonary disease) ([place])   . Hepatitis C   . Hyperlipidemia   . Hypertension   . Shingles 03/27/2020  . TB (tuberculosis)    treatment     Past Surgical History:  Procedure Laterality Date  . ABDOMINAL HYSTERECTOMY    . CATARACT EXTRACTION, BILATERAL    . COLONOSCOPY    . COLONOSCOPY  04/04/2020  . DIRECT LARYNGOSCOPY N/A 06/26/2020   Procedure: DIRECT LARYNGOSCOPY AND BIOPSY;  Surgeon: Rozetta Nunnery, MD;  Location: [place];  Service: ENT;  Laterality: N/A;  . KNEE SURGERY Right   . POLYPECTOMY  11/17/2008   HPP x 1     Allergies: Tudorza pressair [aclidinium bromide], Amitriptyline, Codeine, and Tramadol  Medications: Prior to Admission medications   Medication Sig Start Date End Date Taking? Authorizing Provider  albuterol (PROVENTIL) (2.5 MG/3ML) 0.083% nebulizer solution Take 3 mLs (2.5 mg total) by nebulization every 6 (six) hours as needed for wheezing or shortness of breath. 05/11/20   Ladell Pier, MD  albuterol (VENTOLIN HFA) 108 (90 Base) MCG/ACT inhaler INHALE 2 PUFFS INTO THE LUNGS EVERY 6 HOURS AS NEEDED FOR WHEEZING OR SHORTNESS OF BREATH Patient taking differently: Inhale 2 puffs into the lungs every 6 (six) hours as needed for wheezing or shortness of breath. INHALE 2  PUFFS INTO THE LUNGS EVERY 6 HOURS AS NEEDED FOR WHEEZING OR SHORTNESS OF BREATH 03/29/20   Magdalen Spatz, NP  aspirin EC 81 MG tablet Take 1 tablet (81 mg total) by mouth daily. 03/11/18   Ladell Pier, MD  atorvastatin (LIPITOR) 40 MG tablet TAKE 1 TABLET(40 MG) BY MOUTH DAILY AT 6 PM Patient taking differently: Take 40 mg by mouth daily. 04/07/20   Ladell Pier, MD  Baclofen 5 MG TABS Take 5 mg by mouth at bedtime. 07/03/20   [provider]  BEVESPI AEROSPHERE 9-4.8 MCG/ACT AERO INHALE 2 PUFFS INTO THE LUNGS TWICE DAILY Patient taking differently: Inhale 2 puffs into the lungs in the morning and at bedtime. 04/11/20   Mannam, Hart Robinsons, MD  buPROPion (WELLBUTRIN) 100 MG tablet TAKE 1 TABLET(100 MG) BY MOUTH TWICE DAILY Patient taking differently: Take 100 mg by mouth 2 (two) times daily. 06/05/20   Ladell Pier, MD  CALCIUM CARBONATE-VIT D-MIN PO Take 1,200 mg by mouth daily.     [provider]  Calcium Carbonate-Vitamin D (CALCIUM-VITAMIN D3 PO) Take 1 tablet by mouth daily.    [provider]  dexamethasone (DECADRON) 4 MG tablet Take 2 tablets (8 mg total) by mouth daily. Take daily x 3 days starting the day after cisplatin chemotherapy. Take with food. 07/13/20   Benay Pike, MD  diclofenac Sodium (VOLTAREN) 1 % GEL Place 1 application onto the skin 4 (four) times daily as needed.    [provider]  Fexofenadine HCl (MUCINEX ALLERGY PO) Take 1 tablet by  mouth daily as needed.    [provider]  folic acid (FOLVITE) 1 MG tablet Take 1 mg by mouth daily.  08/14/17   [provider]  gabapentin (NEURONTIN) 100 MG capsule Take 2 capsules (200 mg total) by mouth 2 (two) times daily. 03/24/20   Ladell Pier, MD  hydroxychloroquine (PLAQUENIL) 200 MG tablet Take 200 mg by mouth 2 (two) times daily.    [provider]  lidocaine (XYLOCAINE) 2 % solution Patient: Mix 1part 2% viscous lidocaine, 1part H20. Swallow 87mL  of diluted mixture, 91min before meals and at bedtime, up to QID 07/24/20   Eppie Gibson, MD  lidocaine-prilocaine (EMLA) cream Apply to affected area once 07/13/20   Benay Pike, MD  lisinopril-hydrochlorothiazide (ZESTORETIC) 10-12.5 MG tablet TAKE 1 TABLET BY MOUTH DAILY 03/08/20   Ladell Pier, MD  LORazepam (ATIVAN) 0.5 MG tablet Take 1 tablet (0.5 mg total) by mouth every 6 (six) hours as needed (Nausea or vomiting). 07/13/20   Benay Pike, MD  ondansetron (ZOFRAN) 8 MG tablet Take 1 tablet (8 mg total) by mouth 2 (two) times daily as needed. Start on the third day after cisplatin chemotherapy. 07/13/20   Benay Pike, MD  prochlorperazine (COMPAZINE) 10 MG tablet Take 1 tablet (10 mg total) by mouth every 6 (six) hours as needed (Nausea or vomiting). 07/13/20   Benay Pike, MD  sulfaSALAzine (AZULFIDINE) 500 MG EC tablet Take 500 mg by mouth 2 (two) times daily.  08/13/17   [provider]  valACYclovir (VALTREX) 1000 MG tablet Take 1 tablet (1,000 mg total) by mouth 3 (three) times daily. 03/24/20   Ladell Pier, MD     Family History  Problem Relation Age of Onset  . Heart disease Mother   . Cancer Mother   . Melanoma Mother   . Esophageal cancer Father   . Colon cancer Neg Hx   . Colon polyps Neg Hx   . Rectal cancer Neg Hx   . Stomach cancer Neg Hx     Social History   Socioeconomic History  . Marital status: Married    Spouse name: Not on file  . Number of children: Not on file  . Years of education: Not on file  . Highest education level: Not on file  Occupational History  . Not on file  Tobacco Use  . Smoking status: Current Every Day Smoker    Packs/day: 1.00    Years: 50.00    Pack years: 50.00    Types: Cigarettes    Start date: 33  . Smokeless tobacco: Never Used  . Tobacco comment: 4 cigs a day   Vaping Use  . Vaping Use: Never used  Substance and Sexual Activity  . Alcohol use: No    Alcohol/week: 0.0 standard drinks  .  Drug use: No  . Sexual activity: Not Currently  Other Topics Concern  . Not on file  Social History Narrative  . Not on file   Social Determinants of Health   Financial Resource Strain: Not on file  Food Insecurity: No Food Insecurity  . Worried About Charity fundraiser in the Last Year: Never true  . Ran Out of Food in the Last Year: Never true  Transportation Needs: No Transportation Needs  . Lack of Transportation (Medical): No  . Lack of Transportation (Non-Medical): No  Physical Activity: Not on file  Stress: Not on file  Social Connections: Not on file      Review of  Systems currently denies fever, chest pain, abdominal pain, nausea, vomiting or bleeding.  She does have occasional headaches, chronic dyspnea with exertion, occasional cough and some diffuse arthritic type discomfort  Vital Signs: Blood pressure 143/73, temperature 98.1, respirations 18, O2 sat 95% room air    Physical Exam awake, alert.  Chest with distant breath sounds bilaterally.  Heart with regular rate and rhythm.  Abdomen soft, positive bowel sounds, nontender.  Trace pretibial edema bilaterally.  Imaging: NM PET Image Initial (PI) Skull Base To Thigh  Result Date: 06/28/2020 CLINICAL DATA:  Initial treatment strategy for supraglottic malignancy EXAM: NUCLEAR MEDICINE PET SKULL BASE TO THIGH TECHNIQUE: A 0.5 mCi F-18 FDG was injected intravenously. Full-ring PET imaging was performed from the skull base to thigh after the radiotracer. CT data was obtained and used for attenuation correction and anatomic localization. Fasting blood glucose: 81 mg/dl COMPARISON:  Multiple exams, including CT neck 06/16/2020 FINDINGS: Mediastinal blood pool activity: SUV max 2.1 Liver activity: SUV max NA NECK: The anterior supraglottic mass is slightly eccentric to the right with partial extension into the right aryepiglottic fold and pre epiglottic involvement, maximum SUV 12.5, compatible with malignancy. A right level  II lymph node measuring 1.4 cm in short axis on image 33 of series 3 has a maximum SUV of 16.9, compatible with malignant involvement. In addition to the supraglottic mass, there is some low-grade glottic activity which is symmetric and probably physiologic. There is also some physiologic activity in left anterior paravertebral musculature. Incidental CT findings: Bilateral common carotid atherosclerotic calcification. CHEST: Confluent ground-glass opacity filling several adjacent secondary pulmonary lobules in the lingula, measuring 3.7 by 2.7 cm on image 105 of series 3, with low-grade metabolic activity with maximum SUV of 1.5. This was not present on 01/20/2020 and this region appeared normal on that exam, accordingly this is probably inflammatory. Incidental CT findings: Severe centrilobular emphysema. Airway thickening is present, suggesting bronchitis or reactive airways disease. Coronary, aortic arch, and branch vessel atherosclerotic vascular disease. ABDOMEN/PELVIS: No significant abnormal hypermetabolic activity in this region. Incidental CT findings: Nodular hepatic contour favoring cirrhosis. Aortoiliac atherosclerotic vascular disease. Left periaortic portosystemic collateral vessels indicating portal venous hypertension. Vascular calcifications in the renal hila. Prominent stool throughout the colon favors constipation. SKELETON: No significant abnormal hypermetabolic activity in this region. Incidental CT findings: Mild grade 1 degenerative retrolisthesis of L3 on L4. IMPRESSION: 1. The supraglottic mass has a maximum SUV of 12.5 and the enlarged right level II lymph node has a maximum SUV of 16.9, both compatible with malignancy. No findings of metastatic disease to the chest, abdomen, pelvis, or regional skeleton. 2. New lingular ground-glass opacity compared to 01/20/2020 with very low-grade activity, probably from alveolitis or low-grade atypical infection. 3. Aortic Atherosclerosis (ICD10-I70.0)  and Emphysema (ICD10-J43.9). 4. Airway thickening is present, suggesting bronchitis or reactive airways disease. 5. 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 6.  Prominent stool throughout the colon favors constipation. Electronically Signed   By: Van Clines M.D.   On: 06/28/2020 16:56    Labs:  CBC: Recent Labs    06/02/20 0845 06/26/20 0657 07/25/20 0738  WBC 6.0 5.5 6.3  HGB 13.8 13.5 13.7  HCT 39.7 41.9 42.0  PLT 114* 100* 119*    COAGS: No results for input(s): INR, APTT in the last 8760 hours.  BMP: Recent Labs    06/02/20 0845 06/26/20 0658 07/25/20 0738  NA 139 138 137  K 3.9 3.5 3.6  CL  101 104 105  CO2 23 23 25   GLUCOSE 83 105* 98  BUN 12 12 10   CALCIUM 9.5 9.0 9.1  CREATININE 0.93 0.87 0.79  GFRNONAA 64 >60 >60  GFRAA 74  --   --     LIVER FUNCTION TESTS: Recent Labs    06/02/20 0845 06/26/20 0658  BILITOT 0.5 0.7  AST 30 30  ALT 15 15  ALKPHOS 59 44  PROT 6.6 6.4*  ALBUMIN 3.5* 3.0*    TUMOR MARKERS: No results for input(s): AFPTM, CEA, CA199, CHROMGRNA in the last 8760 hours.  Assessment and Plan: 64 y.o. female smoker with history of COPD, hepatitis C/cirrhosis, hypertension, hyperlipidemia, prior TB 3 years ago and now with newly diagnosed laryngeal/supraglottic carcinoma. She presents today for both Port-A-Cath and gastrostomy tube placements prior to planned chemoradiation.  Details/risks of procedures, including but not limited to, internal bleeding, infection, injury to adjacent structures discussed with patient and daughter with their understanding and consent.   Thank you for this interesting consult.  I greatly enjoyed meeting Raven Torres and look forward to participating in their care.  A copy of this report was sent to the requesting provider on this date.  Electronically Signed: D. Rowe Robert, PA-C 07/27/2020, 8:55 AM   I spent a total of 30  minutes  in face to face in clinical consultation, greater than 50% of which was counseling/coordinating care for port a  cath and gastrostomy tube placements

## 2020-07-27 NOTE — Progress Notes (Signed)
Low wall suction hooked up to g-tube.  Explained to patient she may only have ice chips for the next 24 hours.

## 2020-07-27 NOTE — Progress Notes (Signed)
Spoke with Dr. Annamaria Boots and clarified the patients diet after the initial 24 hours.  He states she may advance her diet as tolerated after 24 hours.  He states she may take her "chemo pill" tonight with a small sip of water and resume her other meds tomorrow, after her initial 24 hours.

## 2020-07-27 NOTE — Progress Notes (Signed)
Patient has decided she is not up to her radiation treatment today. Patient had her port-a-cath and G-tube placed today.  Her original appointment was for 1200 in Radiation Oncology. Her recovery in Short Stay will be complete at 1350 today.  Radiation stated they could try and work her in about 1430 today but the patient would like to just cancel today's treatment.  Called Radiation and spoke to [place] and informed her to just cancel the patient for today.  Ms.Bourassa  is not up to it.  [place] voiced understanding and states we will take her off today's schedule.

## 2020-07-27 NOTE — Progress Notes (Signed)
[facility] Progress Note   Raven Torres [id number] [birth year] 64 y.o.  Raven Torres is managed by Dr. Benay Pike  Actively treated with chemotherapy/immunotherapy/hormonal therapy: yes  Current therapy: Concurrent chemoradiation with weekly cisplatin  Last treated: 07/25/2020 (cycle 1, day 1)  Next scheduled appointment with provider: 08/01/2020  Assessment: Plan:    Malignant neoplasm of supraglottis ([place])   Malignant neoplasm of the supraglottis: Ms. Faulconer was seen in infusion today as she was receiving cycle one, day one of cisplatin given with concurrent chemoradiation.  We will proceed with her treatment today and will have her return in 1 week for labs, follow-up visit, and chemotherapy.  Please see After Visit Summary for patient specific instructions.  Future Appointments  Date Time Provider [place]  07/31/2020 10:00 AM [facility] [id number] [facility] None  08/01/2020 10:00 AM [facility] [id number] [facility] None  08/01/2020  3:15 PM Jennet Maduro, RD [facility] None  08/02/2020 10:00 AM [facility] [id number] [facility] None  08/03/2020 10:00 AM [facility] [id number] [facility] None  08/07/2020 11:30 AM [facility] LINAC 4 [facility] None  08/08/2020 11:30 AM [facility] [id number] [facility] None  08/08/2020 12:00 PM Jennet Maduro, RD [facility] None  08/09/2020 11:30 AM [facility] [id number] [facility] None  08/10/2020 11:30 AM [facility] [id number] [facility] None  08/11/2020  9:10 AM Ladell Pier, MD [facility] None  08/11/2020 11:30 AM [facility] [id number] [facility] None  08/14/2020 10:45 AM [facility] [id number] [facility] None  08/15/2020 11:30 AM [facility] [id number] [facility] None  08/15/2020  1:45 PM Jennet Maduro, RD [facility] None  08/16/2020 11:30 AM [facility] [id number] [facility] None  08/17/2020 11:30 AM [facility] [id number] [facility] None  08/18/2020 11:30 AM [facility] [id number] [facility] None   08/21/2020 11:30 AM [facility] [id number] [facility] None  08/22/2020 11:30 AM [facility] [id number] [facility] None  08/22/2020 12:00 PM Jennet Maduro, RD [facility] None  08/23/2020 11:30 AM [facility] [id number] [facility] None  08/24/2020 11:30 AM [facility] [id number] [facility] None  08/25/2020 11:30 AM [facility] [id number] [facility] None  08/28/2020 11:45 AM [facility] [id number] [facility] None  08/29/2020 11:45 AM [facility] [id number] [facility] None  08/29/2020 12:00 PM Allen, Joli, RD [facility] None  08/30/2020 11:45 AM [facility] [id number] [facility] None  08/31/2020 11:45 AM [facility] [id number] [facility] None  09/01/2020 11:45 AM [facility] [id number] [facility] None  09/04/2020 11:45 AM [facility] [id number] [facility] None  09/05/2020 11:45 AM [facility] [id number] [facility] None  09/05/2020 12:00 PM Neff, Barbara L, RD [facility] None  09/06/2020 11:45 AM [facility] [id number] [facility] None  09/07/2020 11:45 AM [facility] [id number] [facility] None  09/08/2020 11:45 AM [facility] [id number] [facility] None  09/11/2020 11:45 AM [facility] [id number] [facility] None  09/12/2020 11:45 AM [facility] [id number] [facility] None  09/13/2020 11:45 AM [facility] [id number] [facility] None  09/19/2020 12:00 PM Karie Mainland, RD [facility] None  11/08/2020 11:15 AM Rozetta Nunnery, MD [facility] None    No orders of the defined types were placed in this encounter.      Subjective:   Patient ID:  Raven Torres is a 64 y.o. (DOB July 21, 1956) female.  Chief Complaint: No chief complaint on file.   HPI Raven Torres  is a 64 y.o. female with a diagnosis of a local malignant neoplasm of the supraglottis. She is managed by Dr. Chryl Heck and presents for cycle one, day one of cisplatin given with concurrent chemoradiation.  Her swallowing is stable.  She denies any issues of concern today.  She denies fevers, chills, sweats, nausea, vomiting, constipation, or diarrhea.  Her  medications were reviewed with her.  She  has antiemetics.   Medications: I have reviewed the patient's current medications.  Allergies:  Allergies  Allergen Reactions  . Tudorza Pressair [Aclidinium Bromide] Itching  . Amitriptyline Other (See Comments)    Eye spasms per pt  . Codeine Nausea Only  . Tramadol Nausea Only    Past Medical History:  Diagnosis Date  . Arthritis   . Cataract    removed both eyes   . Colitis   . COPD (chronic obstructive pulmonary disease) (HCC)   . Hepatitis C   . Hyperlipidemia   . Hypertension   . Shingles 03/27/2020  . TB (tuberculosis)    treatment     Past Surgical History:  Procedure Laterality Date  . ABDOMINAL HYSTERECTOMY    . CATARACT EXTRACTION, BILATERAL    . COLONOSCOPY    . COLONOSCOPY  04/04/2020  . DIRECT LARYNGOSCOPY N/A 06/26/2020   Procedure: DIRECT LARYNGOSCOPY AND BIOPSY;  Surgeon: Drema Halon, MD;  Location: [facility] OR;  Service: ENT;  Laterality: N/A;  . IR GASTROSTOMY TUBE MOD SED  07/27/2020  . IR IMAGING GUIDED PORT INSERTION  07/27/2020  . KNEE SURGERY Right   . POLYPECTOMY  11/17/2008   HPP x 1     Family History  Problem Relation Age of Onset  . Heart disease Mother   . Cancer Mother   . Melanoma Mother   . Esophageal cancer Father   . Colon cancer Neg Hx   . Colon polyps Neg Hx   . Rectal cancer Neg Hx   . Stomach cancer Neg Hx     Social History   Socioeconomic History  . Marital status: Married    Spouse name: Not on file  . Number of children: Not on file  . Years of education: Not on file  . Highest education level: Not on file  Occupational History  . Not on file  Tobacco Use  . Smoking status: Current Every Day Smoker    Packs/day: 1.00    Years: 50.00    Pack years: 50.00    Types: Cigarettes    Start date: 46  . Smokeless tobacco: Never Used  . Tobacco comment: 4 cigs a day   Vaping Use  . Vaping Use: Never used  Substance and Sexual Activity  . Alcohol use: No     Alcohol/week: 0.0 standard drinks  . Drug use: No  . Sexual activity: Not Currently  Other Topics Concern  . Not on file  Social History Narrative  . Not on file   Social Determinants of Health   Financial Resource Strain: Not on file  Food Insecurity: No Food Insecurity  . Worried About Programme researcher, broadcasting/film/video in the Last Year: Never true  . Ran Out of Food in the Last Year: Never true  Transportation Needs: No Transportation Needs  . Lack of Transportation (Medical): No  . Lack of Transportation (Non-Medical): No  Physical Activity: Not on file  Stress: Not on file  Social Connections: Not on file  Intimate Partner Violence: Not on file    Past Medical History, Surgical history, Social history, and Family history were reviewed and updated as appropriate.   Please see review of systems for further details on the patient's review from today.   Review of Systems:  Review of Systems  Constitutional: Negative for chills, diaphoresis and fever.  HENT: Negative for trouble swallowing and voice change.   Respiratory: Negative for cough, chest tightness, shortness of breath and wheezing.   Cardiovascular: Negative  for chest pain and palpitations.  Gastrointestinal: Negative for abdominal pain, constipation, diarrhea, nausea and vomiting.  Musculoskeletal: Negative for back pain and myalgias.  Neurological: Negative for dizziness, light-headedness and headaches.    Objective:   Physical Exam:  There were no vitals taken for this visit. ECOG: 0  Physical Exam Constitutional:      General: She is not in acute distress.    Appearance: She is not diaphoretic.  HENT:     Head: Normocephalic and atraumatic.  Eyes:     General: No scleral icterus.       Right eye: No discharge.        Left eye: No discharge.     Conjunctiva/sclera: Conjunctivae normal.  Cardiovascular:     Rate and Rhythm: Normal rate and regular rhythm.     Heart sounds: Normal heart sounds. No murmur  heard. No friction rub. No gallop.   Pulmonary:     Effort: Pulmonary effort is normal. No respiratory distress.     Breath sounds: Normal breath sounds. No wheezing or rales.  Skin:    General: Skin is warm and dry.     Findings: No erythema or rash.  Neurological:     Mental Status: She is alert.     Coordination: Coordination normal.  Psychiatric:        Mood and Affect: Mood normal.        Behavior: Behavior normal.        Thought Content: Thought content normal.        Judgment: Judgment normal.     Lab Review:     Component Value Date/Time   NA 137 07/25/2020 0738   NA 139 06/02/2020 0845   K 3.6 07/25/2020 0738   CL 105 07/25/2020 0738   CO2 25 07/25/2020 0738   GLUCOSE 98 07/25/2020 0738   BUN 10 07/25/2020 0738   BUN 12 06/02/2020 0845   CREATININE 0.79 07/25/2020 0738   CREATININE 0.70 02/26/2016 1143   CALCIUM 9.1 07/25/2020 0738   PROT 6.4 (L) 06/26/2020 0658   PROT 6.6 06/02/2020 0845   ALBUMIN 3.0 (L) 06/26/2020 0658   ALBUMIN 3.5 (L) 06/02/2020 0845   AST 30 06/26/2020 0658   ALT 15 06/26/2020 0658   ALKPHOS 44 06/26/2020 0658   BILITOT 0.7 06/26/2020 0658   BILITOT 0.5 06/02/2020 0845   GFRNONAA >60 07/25/2020 0738   GFRNONAA >89 08/23/2014 1506   GFRAA 74 06/02/2020 0845   GFRAA >89 08/23/2014 1506       Component Value Date/Time   WBC 8.9 07/27/2020 0825   RBC 4.24 07/27/2020 0825   HGB 13.2 07/27/2020 0825   HGB 13.8 06/02/2020 0845   HCT 40.4 07/27/2020 0825   HCT 39.7 06/02/2020 0845   PLT 143 (L) 07/27/2020 0825   PLT 114 (L) 06/02/2020 0845   MCV 95.3 07/27/2020 0825   MCV 92 06/02/2020 0845   MCH 31.1 07/27/2020 0825   MCHC 32.7 07/27/2020 0825   RDW 13.4 07/27/2020 0825   RDW 13.0 06/02/2020 0845   LYMPHSABS 0.6 (L) 07/27/2020 0825   LYMPHSABS 2.9 11/19/2016 1223   MONOABS 0.5 07/27/2020 0825   EOSABS 0.0 07/27/2020 0825   EOSABS 0.2 11/19/2016 1223   BASOSABS 0.0 07/27/2020 0825   BASOSABS 0.0 11/19/2016 1223    -------------------------------  Imaging from last 24 hours (if applicable):  Radiology interpretation: IR Gastrostomy Tube  Result Date: 07/27/2020 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 Daryll Brod, MD Guidance:  Fluoroscopic MEDICATIONS: Ancef 2 g; Antibiotics were administered within 1 hour of the procedure. Glucagon 0.5 mg IV ANESTHESIA/SEDATION: Versed 1 mg IV; Fentanyl 100 mcg IV Moderate Sedation Time:  16 The patient was continuously monitored during the procedure by the interventional radiology nurse under my direct supervision. CONTRAST:  47mL OMNIPAQUE IOHEXOL 300 MG/ML SOLN - administered into the gastric lumen. FLUOROSCOPY TIME:  Fluoroscopy Time: 3 minutes 36 seconds (37 mGy). COMPLICATIONS: None immediate. PROCEDURE: Informed consent was obtained from the patient following explanation of the procedure, risks, benefits and alternatives. The patient understands, agrees and consents for the procedure. All questions were addressed. A time out was performed. Maximal barrier sterile technique utilized including caps, mask, sterile gowns, sterile gloves, large sterile drape, hand hygiene, and betadine prep. The left upper quadrant was sterilely prepped and draped. An oral gastric catheter was inserted into the stomach under fluoroscopy. The existing nasogastric feeding tube was removed. Air was injected into the stomach for insufflation and visualization under fluoroscopy. The air distended stomach was confirmed beneath the anterior abdominal wall in the frontal and lateral projections. Under sterile conditions and local anesthesia, a 60 gauge trocar needle was utilized to access the stomach percutaneously beneath the left subcostal margin. Needle position was confirmed within the stomach under biplane fluoroscopy. Contrast injection confirmed position also. A  single T tack was deployed for gastropexy. Over an Amplatz guide wire, a 9-French sheath was inserted into the stomach. A snare device was utilized to capture the oral gastric catheter. The snare device was pulled retrograde from the stomach up the esophagus and out the oropharynx. The 20-French pull-through gastrostomy was connected to the snare device and pulled antegrade through the oropharynx down the esophagus into the stomach and then through the percutaneous tract external to the patient. The gastrostomy was assembled externally. Contrast injection confirms position in the stomach. Images were obtained for documentation. The patient tolerated procedure well. No immediate complication. IMPRESSION: Fluoroscopic insertion of a 20-French "pull-through" gastrostomy. Electronically Signed   By: Jerilynn Mages.  Shick M.D.   On: 07/27/2020 12:33   NM PET Image Initial (PI) Skull Base To Thigh  Result Date: 06/28/2020 CLINICAL DATA:  Initial treatment strategy for supraglottic malignancy EXAM: NUCLEAR MEDICINE PET SKULL BASE TO THIGH TECHNIQUE: A 0.5 mCi F-18 FDG was injected intravenously. Full-ring PET imaging was performed from the skull base to thigh after the radiotracer. CT data was obtained and used for attenuation correction and anatomic localization. Fasting blood glucose: 81 mg/dl COMPARISON:  Multiple exams, including CT neck 06/16/2020 FINDINGS: Mediastinal blood pool activity: SUV max 2.1 Liver activity: SUV max NA NECK: The anterior supraglottic mass is slightly eccentric to the right with partial extension into the right aryepiglottic fold and pre epiglottic involvement, maximum SUV 12.5, compatible with malignancy. A right level II lymph node measuring 1.4 cm in short axis on image 33 of series 3 has a maximum SUV of 16.9, compatible with malignant involvement. In addition to the supraglottic mass, there is some low-grade glottic activity which is symmetric and probably physiologic. There is also some  physiologic activity in left anterior paravertebral musculature. Incidental CT findings: Bilateral common carotid atherosclerotic calcification. CHEST: Confluent ground-glass opacity filling several adjacent secondary pulmonary lobules in the lingula, measuring 3.7 by 2.7 cm on image 105 of series 3, with low-grade metabolic activity with maximum SUV of 1.5. This was not present on 01/20/2020 and this region appeared  normal on that exam, accordingly this is probably inflammatory. Incidental CT findings: Severe centrilobular emphysema. Airway thickening is present, suggesting bronchitis or reactive airways disease. Coronary, aortic arch, and branch vessel atherosclerotic vascular disease. ABDOMEN/PELVIS: No significant abnormal hypermetabolic activity in this region. Incidental CT findings: Nodular hepatic contour favoring cirrhosis. Aortoiliac atherosclerotic vascular disease. Left periaortic portosystemic collateral vessels indicating portal venous hypertension. Vascular calcifications in the renal hila. Prominent stool throughout the colon favors constipation. SKELETON: No significant abnormal hypermetabolic activity in this region. Incidental CT findings: Mild grade 1 degenerative retrolisthesis of L3 on L4. IMPRESSION: 1. The supraglottic mass has a maximum SUV of 12.5 and the enlarged right level II lymph node has a maximum SUV of 16.9, both compatible with malignancy. No findings of metastatic disease to the chest, abdomen, pelvis, or regional skeleton. 2. New lingular ground-glass opacity compared to 01/20/2020 with very low-grade activity, probably from alveolitis or low-grade atypical infection. 3. Aortic Atherosclerosis (ICD10-I70.0) and Emphysema (ICD10-J43.9). 4. Airway thickening is present, suggesting bronchitis or reactive airways disease. 5. 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 6.  Prominent stool throughout the colon favors  constipation. Electronically Signed   By: Fran Mcree Clines M.D.   On: 06/28/2020 16:56   IR IMAGING GUIDED PORT INSERTION  Result Date: 07/27/2020 CLINICAL DATA:  METASTATIC HEAD NECK CANCER, ACCESS FOR CHEMOTHERAPY EXAM: RIGHT INTERNAL SINGLE LUMEN POWER PORT CATHETER INSERTION Date:  07/27/2020 07/27/2020 10:42 am Radiologist:  M. Daryll Brod, MD Guidance:  ULTRASOUND AND FLUOROSCOPIC MEDICATIONS: ANCEF 2 G; The antibiotic was administered within an appropriate time interval prior to skin puncture. ANESTHESIA/SEDATION: Versed 4.0 mg IV; Fentanyl 100 mcg IV; Moderate Sedation Time:  20 MINUTES The patient was continuously monitored during the procedure by the interventional radiology nurse under my direct supervision. FLUOROSCOPY TIME:  ONE minutes, 12 seconds (8 mGy) COMPLICATIONS: None immediate. CONTRAST:  NONE. PROCEDURE: Informed consent was obtained from the patient following explanation of the procedure, risks, benefits and alternatives. The patient understands, agrees and consents for the procedure. All questions were addressed. A time out was performed. Maximal barrier sterile technique utilized including caps, mask, sterile gowns, sterile gloves, large sterile drape, hand hygiene, and 2% chlorhexidine scrub. Under sterile conditions and local anesthesia, RIGHT INTERNAL JUGULAR micropuncture venous access was performed. Access was performed with ultrasound. Images were obtained for documentation of the patent right internal jugular vein. A guide wire was inserted followed by a transitional dilator. This allowed insertion of a guide wire and catheter into the IVC. Measurements were obtained from the SVC / RA junction back to the right IJ venotomy site. In the right infraclavicular chest, a subcutaneous pocket was created over the second anterior rib. This was done under sterile conditions and local anesthesia. 1% lidocaine with epinephrine was utilized for this. A 2.5 cm incision was made in the  skin. Blunt dissection was performed to create a subcutaneous pocket over the right pectoralis major muscle. The pocket was flushed with saline vigorously. There was adequate hemostasis. The port catheter was assembled and checked for leakage. The port catheter was secured in the pocket with two retention sutures. The tubing was tunneled subcutaneously to the right venotomy site and inserted into the SVC/RA junction through a valved peel-away sheath. Position was confirmed with fluoroscopy. Images were obtained for documentation. The patient tolerated the procedure well. No immediate complications. Incisions were closed in a two layer fashion with 4 - 0 Vicryl suture. Dermabond was applied to the skin. The  port catheter was accessed, blood was aspirated followed by saline and heparin flushes. Needle was removed. A dry sterile dressing was applied. IMPRESSION: Ultrasound and fluoroscopically guided right internal jugular single lumen power port catheter insertion. Tip in the SVC/RA junction. Catheter ready for use. Electronically Signed   By: Jerilynn Mages.  Shick M.D.   On: 07/27/2020 12:32

## 2020-07-27 NOTE — Sedation Documentation (Signed)
Percutaneous gastrostomy tube placement procedure begun.

## 2020-07-31 ENCOUNTER — Ambulatory Visit
Admission: RE | Admit: 2020-07-31 | Discharge: 2020-07-31 | Disposition: A | Discharge status: Home / Self Care | Payer: Medicare Other | Source: Ambulatory Visit | Attending: Radiation Oncology | Admitting: Radiation Oncology

## 2020-07-31 DIAGNOSIS — C321 Malignant neoplasm of supraglottis: Secondary | ICD-10-CM | POA: Diagnosis not present

## 2020-07-31 NOTE — Progress Notes (Signed)
Oncology Nurse Navigator Documentation   Ms. Barfield had her PEG/PAC placed on 12/23. I followed up with her today to see how she was doing. She was concerned about flushing her tube and had not been able to accomplish the procedure yet. I demonstrated flushing of her PEG tube and she returned demonstration with success. I also changed her dressing which she had done at home previously. I reinforced my previous education to use on gauze between the retention ring and her skin and clean with Q-tips "around the clock" and minimize her use of tape and use the mesh garmet provided previously. I also talked about her PAC and the use of EMLA cream before tomorrow's lab and chemotherapy appointment. She voiced her understanding of the above and knows to call me if she has any concerns or questions.   Hedda Slade RN, BSN, OCN Head & Neck Oncology Nurse Navigator [facility] Phone # [phone]  Fax # [phone]

## 2020-08-01 ENCOUNTER — Encounter: Payer: Self-pay | Admitting: Hematology and Oncology

## 2020-08-01 ENCOUNTER — Inpatient Hospital Stay: Payer: Medicare Other

## 2020-08-01 ENCOUNTER — Other Ambulatory Visit: Payer: Self-pay

## 2020-08-01 ENCOUNTER — Inpatient Hospital Stay (HOSPITAL_BASED_OUTPATIENT_CLINIC_OR_DEPARTMENT_OTHER): Payer: Medicare Other | Admitting: Hematology and Oncology

## 2020-08-01 ENCOUNTER — Encounter: Payer: Medicare Other | Admitting: Medical

## 2020-08-01 ENCOUNTER — Ambulatory Visit: Payer: Medicare Other

## 2020-08-01 ENCOUNTER — Ambulatory Visit
Admission: RE | Admit: 2020-08-01 | Discharge: 2020-08-01 | Disposition: A | Discharge status: Home / Self Care | Payer: Medicare Other | Source: Ambulatory Visit | Attending: Radiation Oncology | Admitting: Radiation Oncology

## 2020-08-01 VITALS — BP 118/63 | HR 76 | Temp 97.9°F | Resp 18 | Ht 66.0 in | Wt 149.4 lb

## 2020-08-01 DIAGNOSIS — Z95828 Presence of other vascular implants and grafts: Secondary | ICD-10-CM

## 2020-08-01 DIAGNOSIS — C321 Malignant neoplasm of supraglottis: Secondary | ICD-10-CM

## 2020-08-01 DIAGNOSIS — Z5111 Encounter for antineoplastic chemotherapy: Secondary | ICD-10-CM | POA: Diagnosis not present

## 2020-08-01 DIAGNOSIS — C329 Malignant neoplasm of larynx, unspecified: Secondary | ICD-10-CM

## 2020-08-01 LAB — BASIC METABOLIC PANEL WITH GFR
Anion gap: 5 (ref 5–15)
BUN: 8 mg/dL (ref 8–23)
CO2: 29 mmol/L (ref 22–32)
Calcium: 9.2 mg/dL (ref 8.9–10.3)
Chloride: 105 mmol/L (ref 98–111)
Creatinine, Ser: 0.67 mg/dL (ref 0.44–1.00)
GFR, Estimated: >60 mL/min
Glucose, Bld: 94 mg/dL (ref 70–99)
Potassium: 4 mmol/L (ref 3.5–5.1)
Sodium: 139 mmol/L (ref 135–145)

## 2020-08-01 LAB — CBC WITH DIFFERENTIAL/PLATELET
Abs Immature Granulocytes: 0.03 10*3/uL (ref 0.00–0.07)
Basophils Absolute: 0 10*3/uL (ref 0.0–0.1)
Basophils Relative: 0 %
Eosinophils Absolute: 0.3 10*3/uL (ref 0.0–0.5)
Eosinophils Relative: 5 %
HCT: 38.8 % (ref 36.0–46.0)
Hemoglobin: 12.8 g/dL (ref 12.0–15.0)
Immature Granulocytes: 0 %
Lymphocytes Relative: 13 %
Lymphs Abs: 0.9 10*3/uL (ref 0.7–4.0)
MCH: 30.7 pg (ref 26.0–34.0)
MCHC: 33 g/dL (ref 30.0–36.0)
MCV: 93 fL (ref 80.0–100.0)
Monocytes Absolute: 0.9 10*3/uL (ref 0.1–1.0)
Monocytes Relative: 13 %
Neutro Abs: 5.1 10*3/uL (ref 1.7–7.7)
Neutrophils Relative %: 69 %
Platelets: 138 10*3/uL — ABNORMAL LOW (ref 150–400)
RBC: 4.17 MIL/uL (ref 3.87–5.11)
RDW: 13.1 % (ref 11.5–15.5)
WBC: 7.4 10*3/uL (ref 4.0–10.5)
nRBC: 0 % (ref 0.0–0.2)

## 2020-08-01 LAB — MAGNESIUM: Magnesium: 1.6 mg/dL — ABNORMAL LOW (ref 1.7–2.4)

## 2020-08-01 MED ORDER — SODIUM CHLORIDE 0.9 % IV SOLN
Freq: Once | INTRAVENOUS | Status: AC
  Filled 2020-08-01: qty 250

## 2020-08-01 MED ORDER — HEPARIN SOD (PORK) LOCK FLUSH 100 UNIT/ML IV SOLN
500.0000 [IU] | Freq: Once | INTRAVENOUS | Status: DC | PRN
  Filled 2020-08-01: qty 5

## 2020-08-01 MED ORDER — SODIUM CHLORIDE 0.9 % IV SOLN
40.0000 mg/m2 | Freq: Once | INTRAVENOUS | Status: AC
  Administered 2020-08-01: 15:00:00 71 mg via INTRAVENOUS
  Filled 2020-08-01: qty 71

## 2020-08-01 MED ORDER — SODIUM CHLORIDE 0.9% FLUSH
10.0000 mL | INTRAVENOUS | Status: DC | PRN
  Filled 2020-08-01: qty 10

## 2020-08-01 MED ORDER — FOSAPREPITANT DIMEGLUMINE INJECTION 150 MG
150.0000 mg | Freq: Once | INTRAVENOUS | Status: AC
  Administered 2020-08-01: 14:00:00 150 mg via INTRAVENOUS
  Filled 2020-08-01: qty 150

## 2020-08-01 MED ORDER — SODIUM CHLORIDE 0.9 % IV SOLN
10.0000 mg | Freq: Once | INTRAVENOUS | Status: AC
  Administered 2020-08-01: 13:00:00 10 mg via INTRAVENOUS
  Filled 2020-08-01: qty 10

## 2020-08-01 MED ORDER — PALONOSETRON HCL INJECTION 0.25 MG/5ML
INTRAVENOUS | Status: AC
  Filled 2020-08-01: qty 5

## 2020-08-01 MED ORDER — PALONOSETRON HCL INJECTION 0.25 MG/5ML
0.2500 mg | Freq: Once | INTRAVENOUS | Status: AC
  Administered 2020-08-01: 13:00:00 0.25 mg via INTRAVENOUS

## 2020-08-01 MED ORDER — SODIUM CHLORIDE 0.9% FLUSH
10.0000 mL | INTRAVENOUS | Status: DC | PRN
  Administered 2020-08-01: 09:00:00 10 mL via INTRAVENOUS
  Filled 2020-08-01: qty 10

## 2020-08-01 MED ORDER — SODIUM CHLORIDE 0.9 % IV SOLN
Freq: Once | INTRAVENOUS | Status: AC
  Filled 2020-08-01: qty 1000

## 2020-08-01 NOTE — Progress Notes (Signed)
Pt discharged in no apparent distress. Pt left ambulatory without assistance. Pt aware of discharge instructions and verbalized understanding and had no further questions.  

## 2020-08-01 NOTE — Patient Instructions (Signed)
[facility] Discharge Instructions for Patients Receiving Chemotherapy  Today you received the following chemotherapy agents: cisplatin.  To help prevent nausea and vomiting after your treatment, we encourage you to take your nausea medication as directed.   If you develop nausea and vomiting that is not controlled by your nausea medication, call the clinic.   BELOW ARE SYMPTOMS THAT SHOULD BE REPORTED IMMEDIATELY:  *FEVER GREATER THAN 100.5 F  *CHILLS WITH OR WITHOUT FEVER  NAUSEA AND VOMITING THAT IS NOT CONTROLLED WITH YOUR NAUSEA MEDICATION  *UNUSUAL SHORTNESS OF BREATH  *UNUSUAL BRUISING OR BLEEDING  TENDERNESS IN MOUTH AND THROAT WITH OR WITHOUT PRESENCE OF ULCERS  *URINARY PROBLEMS  *BOWEL PROBLEMS  UNUSUAL RASH Items with * indicate a potential emergency and should be followed up as soon as possible.  Feel free to call the clinic should you have any questions or concerns. The clinic phone number is [phone].  Please show the CHEMO ALERT CARD at check-in to the Emergency Department and triage nurse.  Cisplatin injection What is this medicine? CISPLATIN (SIS pla tin) is a chemotherapy drug. It targets fast dividing cells, like cancer cells, and causes these cells to die. This medicine is used to treat many types of cancer like bladder, ovarian, and testicular cancers. This medicine may be used for other purposes; ask your health care provider or pharmacist if you have questions. COMMON BRAND NAME(S): Platinol, Platinol -AQ What should I tell my health care provider before I take this medicine? They need to know if you have any of these conditions:  eye disease, vision problems  hearing problems  kidney disease  low blood counts, like white cells, platelets, or red blood cells  tingling of the fingers or toes, or other nerve disorder  an unusual or allergic reaction to cisplatin, carboplatin, oxaliplatin, other medicines, foods, dyes, or  preservatives  pregnant or trying to get pregnant  breast-feeding How should I use this medicine? This drug is given as an infusion into a vein. It is administered in a hospital or clinic by a specially trained health care professional. Talk to your pediatrician regarding the use of this medicine in children. Special care may be needed. Overdosage: If you think you have taken too much of this medicine contact a poison control center or emergency room at once. NOTE: This medicine is only for you. Do not share this medicine with others. What if I miss a dose? It is important not to miss a dose. Call your doctor or health care professional if you are unable to keep an appointment. What may interact with this medicine? This medicine may interact with the following medications:  foscarnet  certain antibiotics like amikacin, gentamicin, neomycin, polymyxin B, streptomycin, tobramycin, vancomycin This list may not describe all possible interactions. Give your health care provider a list of all the medicines, herbs, non-prescription drugs, or dietary supplements you use. Also tell them if you smoke, drink alcohol, or use illegal drugs. Some items may interact with your medicine. What should I watch for while using this medicine? Your condition will be monitored carefully while you are receiving this medicine. You will need important blood work done while you are taking this medicine. This drug may make you feel generally unwell. This is not uncommon, as chemotherapy can affect healthy cells as well as cancer cells. Report any side effects. Continue your course of treatment even though you feel ill unless your doctor tells you to stop. This medicine may increase your   risk of getting an infection. Call your healthcare professional for advice if you get a fever, chills, or sore throat, or other symptoms of a cold or flu. Do not treat yourself. Try to avoid being around people who are sick. Avoid taking  medicines that contain aspirin, acetaminophen, ibuprofen, naproxen, or ketoprofen unless instructed by your healthcare professional. These medicines may hide a fever. This medicine may increase your risk to bruise or bleed. Call your doctor or health care professional if you notice any unusual bleeding. Be careful brushing and flossing your teeth or using a toothpick because you may get an infection or bleed more easily. If you have any dental work done, tell your dentist you are receiving this medicine. Do not become pregnant while taking this medicine or for 14 months after stopping it. Women should inform their healthcare professional if they wish to become pregnant or think they might be pregnant. Men should not father a child while taking this medicine and for 11 months after stopping it. There is potential for serious side effects to an unborn child. Talk to your healthcare professional for more information. Do not breast-feed an infant while taking this medicine. This medicine has caused ovarian failure in some women. This medicine may make it more difficult to get pregnant. Talk to your healthcare professional if you are concerned about your fertility. This medicine has caused decreased sperm counts in some men. This may make it more difficult to father a child. Talk to your healthcare professional if you are concerned about your fertility. Drink fluids as directed while you are taking this medicine. This will help protect your kidneys. Call your doctor or health care professional if you get diarrhea. Do not treat yourself. What side effects may I notice from receiving this medicine? Side effects that you should report to your doctor or health care professional as soon as possible:  allergic reactions like skin rash, itching or hives, swelling of the face, lips, or tongue  blurred vision  changes in vision  decreased hearing or ringing of the ears  nausea, vomiting  pain, redness, or  irritation at site where injected  pain, tingling, numbness in the hands or feet  signs and symptoms of bleeding such as bloody or black, tarry stools; red or dark brown urine; spitting up blood or brown material that looks like coffee grounds; red spots on the skin; unusual bruising or bleeding from the eyes, gums, or nose  signs and symptoms of infection like fever; chills; cough; sore throat; pain or trouble passing urine  signs and symptoms of kidney injury like trouble passing urine or change in the amount of urine  signs and symptoms of low red blood cells or anemia such as unusually weak or tired; feeling faint or lightheaded; falls; breathing problems Side effects that usually do not require medical attention (report to your doctor or health care professional if they continue or are bothersome):  loss of appetite  mouth sores  muscle cramps This list may not describe all possible side effects. Call your doctor for medical advice about side effects. You may report side effects to FDA at 1-800-FDA-1088. Where should I keep my medicine? This drug is given in a hospital or clinic and will not be stored at home. NOTE: This sheet is a summary. It may not cover all possible information. If you have questions about this medicine, talk to your doctor, pharmacist, or health care provider.  2020 [employer] (2018-07-17 15:59:17)

## 2020-08-01 NOTE — Progress Notes (Signed)
[place] NOTE  Patient Care Team: Ladell Pier, MD as PCP - General (Internal Medicine) Debara Pickett Nadean Corwin, MD as PCP - Cardiology (Cardiology)  CHIEF COMPLAINTS/PURPOSE OF CONSULTATION:   Supraglottic cancer   ASSESSMENT & PLAN:  No problem-specific Assessment & Plan notes found for this encounter.  No orders of the defined types were placed in this encounter.  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 We have discussed her in the ENT in multidisciplinary conference and there was a question if the T staging was a T3 versus a T4.  But since there is no conclusive evidence of T4 and since the patient also prefers no surgery at this time, we believe it is reasonable to proceed with concurrent chemoradiation.  I have discussed weekly cisplatin concurrent with radiation.   I have discussed that concurrent chemoradiation increases the rate of laryngeal preservation. We have discussed about adverse effects including but not limited to fatigue, nausea, vomiting, cytopenias, nephrotoxicity, hearing loss, and increased risk of infections, reactivation of tuberculosis including death.  Most common side effects however fatigue, nausea, mucositis and cytopenias.  Given her underlying cirrhosis, baseline platelet count of around 100,000, we may have to monitor CBC weekly. She is agreeable to proceed with concurrent chemoradiation.  IR gastrostomy orders placed.   No need for baseline audiology evaluation since patient declines any hearing deficit. Speech therapy, social  work referrals placed already. Pt is edentulous, so no dental evaluation needed.  She is here for FU,s he is tolerating the chemotherapy very well. No concerns for toxicity PE, softer neck mass on palpation, PEG and Port site looks well Continue chemo as planned Mag will be replaced She will return to clinic in 1 week, will need weekly FU, labs and infusion appointments.    Thank you for consulting [place] in the care of this patient.  Please do not hesitate to contact us with any additional questions or concerns.  HISTORY OF PRESENTING ILLNESS:   ALBIRTA HEMMINGSEN 64 y.o. female is here because of new diagnosis of laryngeal cancer  Chronology  KOLE TROIA is a 64 y.o. female who presents for evaluation of weight loss and enlarged right neck node.  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 She has had a approximate 30 pound weight loss over the past 4 months.  She is having no airway problems or hoarseness.TABOR RODINE is a 64 y.o. female who presents for evaluation of weight loss and enlarged right neck node. She has had a approximate 30 pound weight loss over the past 4 months.  She has no hoarseness or airway problems. On fiberoptic laryngoscopy she has abnormality of the laryngeal surface of the epiglottis and is taken to the operating room for direct laryngoscopy and biopsy.  06/16/2020 2.0 x 3.2 x 2.5 cm supraglottic laryngeal mass involving the epiglottis and extending along the aryepiglottic folds bilaterally. Tumor extends into the pre-epiglottic fat and likely into the vallecula. Tumor extends into the upper thyroid  cartilage bilaterally, abutting and possibly extending through the outer cortex (particularly on the left). This almost certainly reflects a supraglottic laryngeal squamous cell carcinoma  06/26/2020, she  had laryngoscopy which showed there was a friable ulcerative tumor involving predominantly the  supraglottic area above the vocal cords as the vocal cords were clear to evaluation.  This extended up the laryngeal surface of epiglottis, more on the right side. A direct laryngoscopy was also performed  of the piriform sinuses which were clear bilaterally.  06/28/2020  1. The supraglottic mass has a maximum SUV of 12.5 and the enlarged right level II lymph node has a maximum SUV of 16.9, both compatible with malignancy. No findings of metastatic disease to the chest, abdomen, pelvis, or regional skeleton. 2. New lingular ground-glass opacity compared to 01/20/2020 with very low-grade activity, probably from alveolitis or low-grade atypical infection. 3. Aortic Atherosclerosis (ICD10-I70.0) and Emphysema (ICD10-J43.9). 4. Airway thickening is present, suggesting bronchitis or reactive airways disease. 5. 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 6.  Prominent stool throughout the colon favors constipation.  Her case was discussed in the tumor board, and radiology review suggested that there is no definitive evidence of T4 involvement, hence plan was to consider concurrent chemoradiation and thus medical oncology referral.  She is here with her daughter, She is not a good historian. She needs lot of re direction when taking history. She has baseline history of COPD, not oxygen dependent. She is able to perform her all ADL's. She says memory has been an issue, she is forgetful at times. She had TB 3 yrs ago and was treated for this at [facility]. She has RA, takes plaquenil and sulfasalazine  Hep C induced cirrhosis, Hep C cured by Interferon, cirrhosis well compensated. She is very anxious, lives with her daughter,  She smoked about 1-2 PPD and is now smoking about 2 packs in a week. No alcohol, only when she was very  young. She denies any pain in her neck, swallowing, speech or hearing. She is able to drink and eat everything. Baseline cough and SOB with exertion. Baseline constipation. No hearing loss.  Started concurrent chemoradiation on 07/25/2020 She is doing really well, some soreness around PEG tube, No swallowing difficulty, nausea, vomiting , hearing loss, change in urination No fevers, chills, worsening cough, SOB, diarrhea concerning for an infection. She is able to drink boost daily, may have actually gained some weight. Rest of the pertinent 10 point ROS reviewed and negative  MEDICAL HISTORY:  Past Medical History:  Diagnosis Date  . Arthritis   . Cataract    removed both eyes   . Colitis   . COPD (chronic obstructive pulmonary disease) (HCC)   . Hepatitis C   . Hyperlipidemia   . Hypertension   . Shingles 03/27/2020  . TB (tuberculosis)    treatment     SURGICAL HISTORY: Past Surgical History:  Procedure Laterality Date  . ABDOMINAL HYSTERECTOMY    . CATARACT EXTRACTION, BILATERAL    . COLONOSCOPY    . COLONOSCOPY  04/04/2020  . DIRECT LARYNGOSCOPY N/A 06/26/2020   Procedure: DIRECT LARYNGOSCOPY AND BIOPSY;  Surgeon: Drema Halon, MD;  Location: [facility] OR;  Service: ENT;  Laterality: N/A;  . IR GASTROSTOMY TUBE MOD SED  07/27/2020  . IR IMAGING GUIDED PORT INSERTION  07/27/2020  . KNEE SURGERY Right   . POLYPECTOMY  11/17/2008   HPP x 1     SOCIAL HISTORY: Social History  Socioeconomic History  . Marital status: Married    Spouse name: Not on file  . Number of children: Not on file  . Years of education: Not on file  . Highest education level: Not on file  Occupational History  . Not on file  Tobacco Use  . Smoking status: Current Every Day Smoker    Packs/day: 1.00    Years: 50.00    Pack years: 50.00    Types: Cigarettes    Start date: 61  . Smokeless tobacco: Never Used  . Tobacco comment: 4 cigs a day   Vaping Use  . Vaping Use: Never  used  Substance and Sexual Activity  . Alcohol use: No    Alcohol/week: 0.0 standard drinks  . Drug use: No  . Sexual activity: Not Currently  Other Topics Concern  . Not on file  Social History Narrative  . Not on file   Social Determinants of Health   Financial Resource Strain: Not on file  Food Insecurity: No Food Insecurity  . Worried About Charity fundraiser in the Last Year: Never true  . Ran Out of Food in the Last Year: Never true  Transportation Needs: No Transportation Needs  . Lack of Transportation (Medical): No  . Lack of Transportation (Non-Medical): No  Physical Activity: Not on file  Stress: Not on file  Social Connections: Not on file  Intimate Partner Violence: Not on file    FAMILY HISTORY: Family History  Problem Relation Age of Onset  . Heart disease Mother   . Cancer Mother   . Melanoma Mother   . Esophageal cancer Father   . Colon cancer Neg Hx   . Colon polyps Neg Hx   . Rectal cancer Neg Hx   . Stomach cancer Neg Hx     ALLERGIES:  is allergic to tudorza pressair [aclidinium bromide], amitriptyline, codeine, and tramadol.  MEDICATIONS:  Current Outpatient Medications  Medication Sig Dispense Refill  . albuterol (PROVENTIL) (2.5 MG/3ML) 0.083% nebulizer solution Take 3 mLs (2.5 mg total) by nebulization every 6 (six) hours as needed for wheezing or shortness of breath. 150 mL 1  . albuterol (VENTOLIN HFA) 108 (90 Base) MCG/ACT inhaler INHALE 2 PUFFS INTO THE LUNGS EVERY 6 HOURS AS NEEDED FOR WHEEZING OR SHORTNESS OF BREATH (Patient taking differently: Inhale 2 puffs into the lungs every 6 (six) hours as needed for wheezing or shortness of breath. INHALE 2 PUFFS INTO THE LUNGS EVERY 6 HOURS AS NEEDED FOR WHEEZING OR SHORTNESS OF BREATH) 8.5 g 3  . aspirin EC 81 MG tablet Take 1 tablet (81 mg total) by mouth daily. 30 tablet 0  . atorvastatin (LIPITOR) 40 MG tablet TAKE 1 TABLET(40 MG) BY MOUTH DAILY AT 6 PM (Patient taking differently: Take 40 mg  by mouth daily.) 30 tablet 2  . Baclofen 5 MG TABS Take 5 mg by mouth at bedtime.    Marland Kitchen BEVESPI AEROSPHERE 9-4.8 MCG/ACT AERO INHALE 2 PUFFS INTO THE LUNGS TWICE DAILY (Patient taking differently: Inhale 2 puffs into the lungs in the morning and at bedtime.) 10.7 g 1  . buPROPion (WELLBUTRIN) 100 MG tablet TAKE 1 TABLET(100 MG) BY MOUTH TWICE DAILY (Patient taking differently: Take 100 mg by mouth 2 (two) times daily.) 60 tablet 2  . CALCIUM CARBONATE-VIT D-MIN PO Take 1,200 mg by mouth daily.     . Calcium Carbonate-Vitamin D (CALCIUM-VITAMIN D3 PO) Take 1 tablet by mouth daily.    Marland Kitchen dexamethasone (DECADRON) 4 MG  tablet Take 2 tablets (8 mg total) by mouth daily. Take daily x 3 days starting the day after cisplatin chemotherapy. Take with food. 30 tablet 1  . diclofenac Sodium (VOLTAREN) 1 % GEL Place 1 application onto the skin 4 (four) times daily as needed.    Marland Kitchen Fexofenadine HCl (MUCINEX ALLERGY PO) Take 1 tablet by mouth daily as needed.    . folic acid (FOLVITE) 1 MG tablet Take 1 mg by mouth daily.   3  . gabapentin (NEURONTIN) 100 MG capsule Take 2 capsules (200 mg total) by mouth 2 (two) times daily. 56 capsule 0  . hydroxychloroquine (PLAQUENIL) 200 MG tablet Take 200 mg by mouth 2 (two) times daily.    Marland Kitchen lidocaine (XYLOCAINE) 2 % solution Patient: Mix 1part 2% viscous lidocaine, 1part H20. Swallow 32mL of diluted mixture, 63min before meals and at bedtime, up to QID 200 mL 4  . lidocaine-prilocaine (EMLA) cream Apply to affected area once 30 g 3  . lisinopril-hydrochlorothiazide (ZESTORETIC) 10-12.5 MG tablet TAKE 1 TABLET BY MOUTH DAILY 90 tablet 1  . LORazepam (ATIVAN) 0.5 MG tablet Take 1 tablet (0.5 mg total) by mouth every 6 (six) hours as needed (Nausea or vomiting). 30 tablet 0  . ondansetron (ZOFRAN) 8 MG tablet Take 1 tablet (8 mg total) by mouth 2 (two) times daily as needed. Start on the third day after cisplatin chemotherapy. 30 tablet 1  . prochlorperazine (COMPAZINE) 10 MG  tablet Take 1 tablet (10 mg total) by mouth every 6 (six) hours as needed (Nausea or vomiting). 30 tablet 1  . sulfaSALAzine (AZULFIDINE) 500 MG EC tablet Take 500 mg by mouth 2 (two) times daily.     . valACYclovir (VALTREX) 1000 MG tablet Take 1 tablet (1,000 mg total) by mouth 3 (three) times daily. 21 tablet 0   No current facility-administered medications for this visit.    PHYSICAL EXAMINATION: ECOG PERFORMANCE STATUS: 0 - Asymptomatic  There were no vitals filed for this visit. There were no vitals filed for this visit.  GENERAL:alert, no distress and comfortable SKIN: skin color, texture, turgor are normal, no rashes or significant lesions EYES: normal, conjunctiva are pink and non-injected, sclera clear OROPHARYNX:no exudate, no erythema and lips, buccal mucosa, and tongue normal. Dentures. NECK: supple, thyroid normal size, non-tender, without nodularity LYMPH:  Palpable large right sided LN, painless, softer on palpation. LUNGS: diminished expiratory effort, consistent with COPD, no adventitious sounds HEART: regular rate & rhythm and no murmurs and no lower extremity edema ABDOMEN:abdomen soft, non-tender and normal bowel sounds Musculoskeletal:no cyanosis of digits and no clubbing  PSYCH: alert & oriented x 3 with fluent speech NEURO: no focal motor/sensory deficits  LABORATORY DATA:  I have reviewed the data as listed Lab Results  Component Value Date   WBC 8.9 07/27/2020   HGB 13.2 07/27/2020   HCT 40.4 07/27/2020   MCV 95.3 07/27/2020   PLT 143 (L) 07/27/2020     Chemistry      Component Value Date/Time   NA 137 07/25/2020 0738   NA 139 06/02/2020 0845   K 3.6 07/25/2020 0738   CL 105 07/25/2020 0738   CO2 25 07/25/2020 0738   BUN 10 07/25/2020 0738   BUN 12 06/02/2020 0845   CREATININE 0.79 07/25/2020 0738   CREATININE 0.70 02/26/2016 1143      Component Value Date/Time   CALCIUM 9.1 07/25/2020 0738   ALKPHOS 44 06/26/2020 0658   AST 30 06/26/2020  0658   ALT  15 06/26/2020 0658   BILITOT 0.7 06/26/2020 0658   BILITOT 0.5 06/02/2020 0845       RADIOGRAPHIC STUDIES: I have personally reviewed the radiological images as listed and agreed with the findings in the report. IR Gastrostomy Tube  Result Date: 07/27/2020 INDICATION: Metastatic head and neck cancer (laryngeal cancer) EXAM: FLUOROSCOPIC 20 FRENCH PULL-THROUGH GASTROSTOMY Date:  07/27/2020 07/27/2020 10:42 am Radiologist:  M. Daryll Brod, MD Guidance:  Fluoroscopic MEDICATIONS: Ancef 2 g; Antibiotics were administered within 1 hour of the procedure. Glucagon 0.5 mg IV ANESTHESIA/SEDATION: Versed 1 mg IV; Fentanyl 100 mcg IV Moderate Sedation Time:  16 The patient was continuously monitored during the procedure by the interventional radiology nurse under my direct supervision. CONTRAST:  61mL OMNIPAQUE IOHEXOL 300 MG/ML SOLN - administered into the gastric lumen. FLUOROSCOPY TIME:  Fluoroscopy Time: 3 minutes 36 seconds (37 mGy). COMPLICATIONS: None immediate. PROCEDURE: Informed consent was obtained from the patient following explanation of the procedure, risks, benefits and alternatives. The patient understands, agrees and consents for the procedure. All questions were addressed. A time out was performed. Maximal barrier sterile technique utilized including caps, mask, sterile gowns, sterile gloves, large sterile drape, hand hygiene, and betadine prep. The left upper quadrant was sterilely prepped and draped. An oral gastric catheter was inserted into the stomach under fluoroscopy. The existing nasogastric feeding tube was removed. Air was injected into the stomach for insufflation and visualization under fluoroscopy. The air distended stomach was confirmed beneath the anterior abdominal wall in the frontal and lateral projections. Under sterile conditions and local anesthesia, a 31 gauge trocar needle was utilized to access the stomach percutaneously beneath the left subcostal margin.  Needle position was confirmed within the stomach under biplane fluoroscopy. Contrast injection confirmed position also. A single T tack was deployed for gastropexy. Over an Amplatz guide wire, a 9-French sheath was inserted into the stomach. A snare device was utilized to capture the oral gastric catheter. The snare device was pulled retrograde from the stomach up the esophagus and out the oropharynx. The 20-French pull-through gastrostomy was connected to the snare device and pulled antegrade through the oropharynx down the esophagus into the stomach and then through the percutaneous tract external to the patient. The gastrostomy was assembled externally. Contrast injection confirms position in the stomach. Images were obtained for documentation. The patient tolerated procedure well. No immediate complication. IMPRESSION: Fluoroscopic insertion of a 20-French "pull-through" gastrostomy. Electronically Signed   By: Jerilynn Mages.  Shick M.D.   On: 07/27/2020 12:33   IR IMAGING GUIDED PORT INSERTION  Result Date: 07/27/2020 CLINICAL DATA:  METASTATIC HEAD NECK CANCER, ACCESS FOR CHEMOTHERAPY EXAM: RIGHT INTERNAL SINGLE LUMEN POWER PORT CATHETER INSERTION Date:  07/27/2020 07/27/2020 10:42 am Radiologist:  M. Daryll Brod, MD Guidance:  ULTRASOUND AND FLUOROSCOPIC MEDICATIONS: ANCEF 2 G; The antibiotic was administered within an appropriate time interval prior to skin puncture. ANESTHESIA/SEDATION: Versed 4.0 mg IV; Fentanyl 100 mcg IV; Moderate Sedation Time:  20 MINUTES The patient was continuously monitored during the procedure by the interventional radiology nurse under my direct supervision. FLUOROSCOPY TIME:  ONE minutes, 12 seconds (8 mGy) COMPLICATIONS: None immediate. CONTRAST:  NONE. PROCEDURE: Informed consent was obtained from the patient following explanation of the procedure, risks, benefits and alternatives. The patient understands, agrees and consents for the procedure. All questions were addressed. A time  out was performed. Maximal barrier sterile technique utilized including caps, mask, sterile gowns, sterile gloves, large sterile drape, hand hygiene, and 2% chlorhexidine scrub. Under sterile conditions  and local anesthesia, RIGHT INTERNAL JUGULAR micropuncture venous access was performed. Access was performed with ultrasound. Images were obtained for documentation of the patent right internal jugular vein. A guide wire was inserted followed by a transitional dilator. This allowed insertion of a guide wire and catheter into the IVC. Measurements were obtained from the SVC / RA junction back to the right IJ venotomy site. In the right infraclavicular chest, a subcutaneous pocket was created over the second anterior rib. This was done under sterile conditions and local anesthesia. 1% lidocaine with epinephrine was utilized for this. A 2.5 cm incision was made in the skin. Blunt dissection was performed to create a subcutaneous pocket over the right pectoralis major muscle. The pocket was flushed with saline vigorously. There was adequate hemostasis. The port catheter was assembled and checked for leakage. The port catheter was secured in the pocket with two retention sutures. The tubing was tunneled subcutaneously to the right venotomy site and inserted into the SVC/RA junction through a valved peel-away sheath. Position was confirmed with fluoroscopy. Images were obtained for documentation. The patient tolerated the procedure well. No immediate complications. Incisions were closed in a two layer fashion with 4 - 0 Vicryl suture. Dermabond was applied to the skin. The port catheter was accessed, blood was aspirated followed by saline and heparin flushes. Needle was removed. A dry sterile dressing was applied. IMPRESSION: Ultrasound and fluoroscopically guided right internal jugular single lumen power port catheter insertion. Tip in the SVC/RA junction. Catheter ready for use. Electronically Signed   By: Jerilynn Mages.  Shick M.D.    On: 07/27/2020 12:32    I have reviewed all pertinent images and pathology reports.\  Pathology  PATHOLOGY SURGICAL PATHOLOGY  CASE: [id number]  PATIENT: Doreatha Martin  Surgical Pathology Report      Clinical History: laryngeal cancer (cm)      FINAL MICROSCOPIC DIAGNOSIS:   A. EPIGLOTTIS, BIOPSY:  - Focal poorly differentiated carcinoma, see comment.   COMMENT:   Most of the fragments have a dense lymphoid infiltrate with germinal  centers. One fragment has poorly differentiated cells at one edge. The  cells are positive for cytokeratin 5/6 and negative for CD20. Thus, the  immunophenotype is consistent with a squamous cell carcinoma. Dr.  Vic Ripper has reviewed the case.    All questions were answered. The patient knows to call the clinic with any problems, questions or concerns. I spent  Over 30 minutes in the care of this patient including H and P, review of medical records, counseling and coordination of care.     Benay Pike, MD 08/01/2020 8:44 AM

## 2020-08-01 NOTE — Progress Notes (Signed)
Nutrition Follow-up:  Patient with laryngeal cancer.  Patient receiving radiation and chemotherapy. PEG and PAC placed on 12/23.   Met with patient during infusion. Patient reports that her abdomin is sore from recent PEG placement.  Reports that she is drinking about 2 boost shakes per day and tolerating well.  Reports that yesterday she ate some soup, few peanut butter crakcers, refried beans with cheese and sour cream.  Daughter recently bought her cottage cheese and fruit.  Patient having a hard time remembering foods she has eaten.  Denies having difficulty swallowing or tolerating foods.   Reports constipation and planning to start miralax tonight.      Medications: reviewed  Labs: Mag 1.6  Anthropometrics:   Weight 149 lb 6.4 oz today  148 lb 12.8 oz on 12/10   Estimated Energy Needs  Kcals: 6619-6940 Protein: 100-117 g Fluid: > 2 L  NUTRITION DIAGNOSIS: Predicted suboptimal energy intake continues   INTERVENTION:  Recommend patient begin giving 1 carton of osmolite 1.5 via feeding tube. Flush with 12m of water before and after feeding.  Observed patient give water flushes and 1 carton of osmolite 1.5 via feeding tube in infusion this am.  Patient needed reminder of water flush before and after feeding.  Handwritten instructions giving to patient.   Encouraged patient to continue to eat orally as able.      MONITORING, EVALUATION, GOAL: weigh trends, intake, tube feeding   NEXT VISIT: Tuesday, Jan 4 th after radiation  Crespin Forstrom B. AZenia Resides RHarlow[place] LLewisRegistered Dietitian [phone](mobile)

## 2020-08-02 ENCOUNTER — Ambulatory Visit
Admission: RE | Admit: 2020-08-02 | Discharge: 2020-08-02 | Disposition: A | Discharge status: Home / Self Care | Payer: Medicare Other | Source: Ambulatory Visit | Attending: Radiation Oncology | Admitting: Radiation Oncology

## 2020-08-02 DIAGNOSIS — C321 Malignant neoplasm of supraglottis: Secondary | ICD-10-CM | POA: Diagnosis not present

## 2020-08-03 ENCOUNTER — Ambulatory Visit
Admission: RE | Admit: 2020-08-03 | Discharge: 2020-08-03 | Disposition: A | Discharge status: Home / Self Care | Payer: Medicare Other | Source: Ambulatory Visit | Attending: Radiation Oncology | Admitting: Radiation Oncology

## 2020-08-03 ENCOUNTER — Telehealth: Payer: Self-pay | Admitting: Hematology and Oncology

## 2020-08-03 DIAGNOSIS — C321 Malignant neoplasm of supraglottis: Secondary | ICD-10-CM | POA: Diagnosis not present

## 2020-08-03 NOTE — Telephone Encounter (Signed)
Scheduled appointments per 12/28 los. Called patient, no answer. Left msg with appointments dates and times.

## 2020-08-07 ENCOUNTER — Ambulatory Visit: Payer: Medicare Other

## 2020-08-08 ENCOUNTER — Ambulatory Visit
Admission: RE | Admit: 2020-08-08 | Discharge: 2020-08-08 | Disposition: A | Discharge status: Home / Self Care | Payer: Medicare Other | Source: Ambulatory Visit | Attending: Radiation Oncology | Admitting: Radiation Oncology

## 2020-08-08 ENCOUNTER — Ambulatory Visit: Payer: Medicare Other

## 2020-08-08 ENCOUNTER — Other Ambulatory Visit: Payer: Self-pay

## 2020-08-08 ENCOUNTER — Telehealth: Payer: Self-pay | Admitting: *Deleted

## 2020-08-08 DIAGNOSIS — C321 Malignant neoplasm of supraglottis: Secondary | ICD-10-CM | POA: Diagnosis not present

## 2020-08-08 DIAGNOSIS — C329 Malignant neoplasm of larynx, unspecified: Secondary | ICD-10-CM | POA: Insufficient documentation

## 2020-08-08 NOTE — Progress Notes (Signed)
Nutrition Follow-up:  Patient with laryngeal cancer.  Patient receiving chemotherapy and radiation.  PEG and PAC placed on 12/23.    Met with patient after radiation today with daughter present.  Patient had issues with nausea and vomiting yesterday. Has not been taking antinausea medications.  Reports stomach pain with passing hard balls of stool then loose stool after last night.  Patient was worried PEG site was infected but has been looked at by nursing and site is ok.  Drinking boost shakes and eating italian ice, popsciles, soups, pedialyte.  Does not eat much meat. Taste are off per patient.    Patient has not given 1 carton of osmolite 1.5 as instructed last visit.  Has been flushing with 1 syringe of water daily.     Medications: zofran, ativan, compazine  Labs: reviewed  Anthropometrics:   Patient reports weight today of 147 lb decreased from 149 lb 6.4 oz on 12/28  148 lb 12.8 oz on 12/10    NUTRITION DIAGNOSIS: Predicted suboptimal energy intake continues   INTERVENTION:  Recommend giving 1 carton of osmolite 1.5 via feeding tube daily at this time. Flush with 60 ml of water before and after.   Patient to continue to eat orally as able. Dr Isidore Moos reviewed taking nausea medications with patient and daughter today. Patient will be more proactive taking medications.      MONITORING, EVALUATION, GOAL: weight trends, intake, tube feeding   NEXT VISIT: Thursday, Jan 13 after radiation  Raven Torres B. Zenia Resides, [place], [place] Registered Dietitian [phone] (mobile)

## 2020-08-08 NOTE — Telephone Encounter (Signed)
Patients daughter called about nausea through the night and inability to keep anything down.  She stated that patient has pushed it too hard since having her PORT and PEG placed.  I questioned if she was using the antinausea medications that were prescribed and daughter was unsure of which ones she had given.  Told her that she has 3 different ones to use and her decadron can also help with nausea.  Encouraged her to try dosing antinausea meds atleast one hour before taking other medications or feeding and to try different ones to see which is most helpful.  Alessandra Bevels, RN Head and Neck Navigator will assist with inspecting the PEG tube when she comes in today for radiation treatment.

## 2020-08-09 ENCOUNTER — Ambulatory Visit
Admission: RE | Admit: 2020-08-09 | Discharge: 2020-08-09 | Disposition: A | Discharge status: Home / Self Care | Payer: Medicare Other | Source: Ambulatory Visit | Attending: Radiation Oncology | Admitting: Radiation Oncology

## 2020-08-09 DIAGNOSIS — C321 Malignant neoplasm of supraglottis: Secondary | ICD-10-CM | POA: Diagnosis not present

## 2020-08-09 DIAGNOSIS — C329 Malignant neoplasm of larynx, unspecified: Secondary | ICD-10-CM | POA: Diagnosis not present

## 2020-08-09 NOTE — Progress Notes (Signed)
Oncology Nurse Navigator Documentation  I spoke with [name] today regarding her appointment with Raven Torres SLP and Raven Torres PT tomorrow starting at 10:15 am. She is aware that the appointments are at [facility] and she will proceed to radiation after the two appointments are comlpleted.   Hedda Slade RN, BSN, OCN Head & Neck Oncology Nurse Navigator [facility] at [facility] Phone # [phone]  Fax # [phone]

## 2020-08-10 ENCOUNTER — Encounter: Payer: Self-pay | Admitting: Physical Therapy

## 2020-08-10 ENCOUNTER — Other Ambulatory Visit: Payer: Self-pay

## 2020-08-10 ENCOUNTER — Ambulatory Visit: Payer: Medicare Other | Attending: Radiation Oncology

## 2020-08-10 ENCOUNTER — Ambulatory Visit: Payer: Medicare Other | Admitting: Physical Therapy

## 2020-08-10 ENCOUNTER — Ambulatory Visit
Admission: RE | Admit: 2020-08-10 | Discharge: 2020-08-10 | Disposition: A | Discharge status: Home / Self Care | Payer: Medicare Other | Source: Ambulatory Visit | Attending: Radiation Oncology | Admitting: Radiation Oncology

## 2020-08-10 ENCOUNTER — Encounter: Payer: Self-pay | Admitting: General Practice

## 2020-08-10 DIAGNOSIS — R293 Abnormal posture: Secondary | ICD-10-CM

## 2020-08-10 DIAGNOSIS — C321 Malignant neoplasm of supraglottis: Secondary | ICD-10-CM | POA: Diagnosis not present

## 2020-08-10 DIAGNOSIS — R1310 Dysphagia, unspecified: Secondary | ICD-10-CM | POA: Diagnosis not present

## 2020-08-10 DIAGNOSIS — C329 Malignant neoplasm of larynx, unspecified: Secondary | ICD-10-CM | POA: Diagnosis not present

## 2020-08-10 NOTE — Therapy (Signed)
[facility] [address] [place], [place], [address] Phone: [phone]   Fax:  [phone]  Physical Therapy Evaluation  Patient Details  Name: Raven Torres MRN: [id number] Date of Birth: Jun 04, 1956 Referring Provider (PT): Vertis Kelch Date: 08/10/2020   PT End of Session - 08/10/20 0844    Visit Number 1    Number of Visits 2    Date for PT Re-Evaluation 10/05/20    PT Start Time 1027    PT Stop Time 1053    PT Time Calculation (min) 26 min    Activity Tolerance Patient tolerated treatment well    Behavior During Therapy [facility] for tasks assessed/performed           Past Medical History:  Diagnosis Date  . Arthritis   . Cataract    removed both eyes   . Colitis   . COPD (chronic obstructive pulmonary disease) (HCC)   . Hepatitis C   . Hyperlipidemia   . Hypertension   . Shingles 03/27/2020  . TB (tuberculosis)    treatment     Past Surgical History:  Procedure Laterality Date  . ABDOMINAL HYSTERECTOMY    . CATARACT EXTRACTION, BILATERAL    . COLONOSCOPY    . COLONOSCOPY  04/04/2020  . DIRECT LARYNGOSCOPY N/A 06/26/2020   Procedure: DIRECT LARYNGOSCOPY AND BIOPSY;  Surgeon: Drema Halon, MD;  Location: [facility] OR;  Service: ENT;  Laterality: N/A;  . IR GASTROSTOMY TUBE MOD SED  07/27/2020  . IR IMAGING GUIDED PORT INSERTION  07/27/2020  . KNEE SURGERY Right   . POLYPECTOMY  11/17/2008   HPP x 1     There were no vitals filed for this visit.    Subjective Assessment - 08/10/20 0841    Subjective My stomach hurts today from the tube placement.    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 Yes    Pain Score --   pt unable to give a number   Pain Location --   feeding tube site   Pain Orientation Left    Pain Descriptors / Indicators Sore    Pain Type Acute pain    Pain Onset 1 to 4 weeks ago    Pain Frequency Constant    Aggravating Factors  moving    Pain Relieving Factors apercreme    Effect of Pain on Daily Activities hard to move around              [facility] PT Assessment - 08/10/20 0001      Assessment   Medical Diagnosis cancer of epiglottis    Referring Provider (PT) Basilio Cairo    Onset Date/Surgical Date 06/16/20    Hand Dominance Left    Prior Therapy PT for her back a while ago      Precautions   Precautions Other (comment)    Precaution Comments at risk for lymphedema      Restrictions   Weight Bearing Restrictions No      Balance Screen   Has the patient fallen in the past 6 months No    Has the patient had a decrease in activity level because of a fear of falling?  No    Is the patient reluctant to leave their home because of a fear of falling?  No      Home Ecologist residence    Living Arrangements Children;Other relatives   daughter and 2 grandsons   Available Help at Discharge Family    Type of Home House      Prior Function   Level of [place] Retired    Leisure pt does not currently exercise      Cognition   Overall Cognitive Status History of cognitive impairments - at baseline   pt reports she has memory issues     Functional Tests   Functional tests Sit to Stand      Sit to Stand   Comments unable to assess due to pain      Posture/Postural Control   Posture/Postural Control Postural limitations    Postural Limitations Rounded Shoulders;Forward head      ROM / Strength   AROM / PROM / Strength AROM      AROM   Overall AROM  Within functional limits for tasks performed    Overall AROM Comments shoulder ROM WFL     AROM Assessment Site Cervical    Cervical Flexion WFl    Cervical Extension WFL    Cervical - Right Side Bend WFL    Cervical - Left Side Bend WFL    Cervical - Right Rotation WFL    Cervical - Left Rotation WFL      Ambulation/Gait   Ambulation/Gait Yes    Ambulation/Gait Assistance 6: Modified independent (Device/Increase time)    Ambulation Distance (Feet) 10 Feet    Gait Pattern Decreased arm swing - right;Decreased arm swing - left;Decreased hip/knee flexion - right;Decreased hip/knee flexion - left;Decreased dorsiflexion - right;Decreased dorsiflexion - left    Ambulation Surface Level             LYMPHEDEMA/ONCOLOGY QUESTIONNAIRE - 08/10/20 0001      Lymphedema Assessments   Lymphedema Assessments Head and Neck      Head and Neck   4 cm superior to sternal notch around neck 33.3 cm    6 cm superior to sternal notch around neck 32.6 cm    8 cm superior to sternal notch around neck 33 cm                   Objective measurements completed on examination: See above findings.               PT Education - 08/10/20 0844    Education provided Yes    Education Details Pt will return to baseline cervical ROM measurements and not demonstrate any signs or symptoms of lymphedema to allow pt to return to PLOF.    Person(s) Educated Patient    Methods Explanation;Handout    Comprehension Verbalized understanding               PT Long Term Goals - 08/10/20 0845      PT LONG TERM GOAL #1   Title Pt will return to baseline ROM measurements and will not demonstrate any signs and symptoms of lymphedema.    Time 8    Period Weeks    Status New    Target Date 10/05/20              Head and Neck Clinic Goals - 08/10/20 0845      Patient will be able to verbalize  understanding of a home exercise program for cervical range of motion, posture, and walking.    Time 1    Period Days    Status Achieved      Patient will be able to verbalize  understanding of proper sitting and standing posture.    Time 1    Period Days    Status Achieved      Patient will be able to verbalize understanding of lymphedema risk and availability of treatment for this condition.    Time 1    Period Days    Status Achieved              Plan - 08/10/20 1112    Clinical Impression Statement Pt repots to head and neck clinic after recent diagnosis of carcinoma of epiglottis. Pt is currently undergoing radiation and chemotherapy. She began radiation to bilateral neck on 12/20 and will complete on 09/13/20.Educated pt about signs and symptoms of lymphedema as well as anatomy and physiology of lymphatic system. Educated pt in importance of staying as active as possible throughout treatment to decrease fatigue as well as head and neck ROM exercises to decrease loss of ROM. Will see pt after completion of radiation to reassess ROM and assess for lymphedema to determine therapy needs at that time.    Personal Factors and Comorbidities Comorbidity 1    Comorbidities RA    Stability/Clinical Decision Making Stable/Uncomplicated    Clinical Decision Making Low    PT Frequency --   eval and 1 f/u   PT Treatment/Interventions ADLs/Self Care Home Management;Therapeutic exercise;Patient/family education    PT Next Visit Plan reassess baselines    Consulted and Agree with Plan of Care Patient           Patient will benefit from skilled therapeutic intervention in order to improve the following deficits and impairments:  Pain,Postural dysfunction,Decreased knowledge of precautions  Visit Diagnosis: Abnormal posture  Malignant neoplasm of supraglottis [facility])     Problem List Patient Active Problem List   Diagnosis Date Noted  . 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 Herpes zoster without complication [id number]  . GOLD COPD III B 04/23/2018  . Pruritic dermatitis 04/23/2018  . High cholesterol 06/24/2017  . Shortness of  breath 06/04/2017  . Coronary artery calcification 06/04/2017  . Other fatigue 06/04/2017  . Centrilobular emphysema ([place]) 05/15/2017  . Atherosclerosis of arteries 05/15/2017  . Poor memory 05/15/2017  . TB lung, latent 04/16/2017  . Lung nodules 04/16/2017  . Diabetes mellitus screening 11/19/2016  . Fibromyalgia 05/15/2015  . Rheumatoid arthritis ([place]) 08/22/2014  . HTN (hypertension) 08/22/2014  . Tobacco abuse 08/22/2014  . Hepatic cirrhosis ([place]) 05/31/2014  . Chronic hepatitis C virus infection ([place]) 05/03/2014    Allyson Sabal [facility] 08/10/2020, 11:15 AM  [place], [place], [address] Phone: [phone]   Fax:  [phone]  Name: Raven Torres MRN: [id number] Date of Birth: 1955-11-12  Manus Gunning, PT 08/10/20 11:16 AM

## 2020-08-10 NOTE — Therapy (Signed)
[facility] [address] [place], [place], [address] Phone: [phone]   Fax:  [phone]  Speech Language Pathology Evaluation  Patient Details  Name: Raven Torres MRN: [id number] Date of Birth: [birth year] Referring Provider (SLP): Eppie Gibson, MD   Encounter Date: 08/10/2020   End of Session - 08/10/20 1644    Visit Number 1    Number of Visits 7    Date for SLP Re-Evaluation 11/08/20    SLP Start Time 45    SLP Stop Time  1140    SLP Time Calculation (min) 40 min    Activity Tolerance Patient tolerated treatment well           Past Medical History:  Diagnosis Date  . Arthritis   . Cataract    removed both eyes   . Colitis   . COPD (chronic obstructive pulmonary disease) ([place])   . Hepatitis C   . Hyperlipidemia   . Hypertension   . Shingles 03/27/2020  . TB (tuberculosis)    treatment     Past Surgical History:  Procedure Laterality Date  . ABDOMINAL HYSTERECTOMY    . CATARACT EXTRACTION, BILATERAL    . COLONOSCOPY    . COLONOSCOPY  04/04/2020  . DIRECT LARYNGOSCOPY N/A 06/26/2020   Procedure: DIRECT LARYNGOSCOPY AND BIOPSY;  Surgeon: Rozetta Nunnery, MD;  Location: [place];  Service: ENT;  Laterality: N/A;  . IR GASTROSTOMY TUBE MOD SED  07/27/2020  . IR IMAGING GUIDED PORT INSERTION  07/27/2020  . KNEE SURGERY Right   . POLYPECTOMY  11/17/2008   HPP x 1     There were no vitals filed for this visit.   Subjective Assessment - 08/10/20 1627    Subjective Reports she can eat dys I, II and some soft foods but with dysgeusia. Ate oatmeal cookie yesterday adn neeed liquid wash for pharyngeal clearance. No overt s/sx aspiration reported with meals.    Currently in Pain? Yes    Pain Score --   diffiuclty giving number   Pain Location --   PEG site   Pain Orientation Left    Pain Descriptors / Indicators Sore    Pain Type Acute pain    Pain Onset 1 to 4 weeks ago    Pain Frequency  Constant    Aggravating Factors  moving    Pain Relieving Factors topical anesthetic              SLP Evaluation [facility] - 08/10/20 1627      SLP Visit Information   SLP Received On 08/10/20    Referring Provider (SLP) Eppie Gibson, MD    Onset Date Fall 2021    Medical Diagnosis carcinoma of epiglottis      Subjective   Patient/Family Stated Goal "I want to keep my swallowing"      General Information   HPI 2 month enlarging rt neck mass - on 06-16-20 supraglottic mass ID'd on CT, with 06-26-20 biopsy of epiglottis revealed focal poorly differentiated SCCA. 06-28-20 PET confirmed malignancy with nodal involvement on rt, without metastasis.PT with PEG placement 07-27-20, and began rad to epiglottis and bil neck 12 20-21 and chemo on 07-25-20.      Prior Functional Status   Cognitive/Linguistic Baseline Within functional limits      Cognition   Overall Cognitive Status History of cognitive impairments - at baseline    Memory --   pt reports memory problems     Auditory Comprehension  Overall Auditory Comprehension Appears within functional limits for tasks assessed      Verbal Expression   Overall Verbal Expression Appears within functional limits for tasks assessed      Oral Motor/Sensory Function   Overall Oral Motor/Sensory Function Appears within functional limits for tasks assessed      Motor Speech   Overall Motor Speech Appears within functional limits for tasks assessed           Pt currently tolerates soft diet and thin  Liquids - today pt ate small bites of cheese stick and drank water. Pt consumed these items without overt s/s aspiration, nor oral deficits. Thyroid elevation appeared adequate, and swallows appeared timely with some additional time for mastication. Pt's swallow deemed WNL/WFL for these diet items at this time.   Because data states the risk for dysphagia during and after radiation treatment is high due to undergoing radiation tx, SLP taught pt  about the possibility of reduced/limited ability for PO intake during rad tx. SLP encouraged pt to continue swallowing POs as far into rad tx as possible. SLP also talked about performing only non-swallowing tasks on the handout/HEP when swallowing becomes extremely difficult, and then adding swallowing tasks back in when it becomes possible to do so.  SLP educated pt re: changes to swallowing musculature after rad tx, and why adherence to dysphagia HEP provided today and PO consumption was necessary to reduce muscle fibrosis following rad tx. Pt demonstrated understanding of these things to SLP.    SLP then developed a HEP for pt and pt was instructed how to perform exercises involving lingual, vocal, and pharyngeal strengthening. SLP performed each exercise and pt return demonstrated each exercise. SLP ensured pt performance was correct prior to moving on to next exercise. Pt was instructed to complete this program 2 times a day, until 6 months after her last rad tx, then x2 a week after that.                 SLP Education - 08/10/20 1642    Education Details late effects head/neck radiation on swallowing, HEP procedure    Person(s) Educated Patient    Methods Explanation;Demonstration;Verbal cues;Handout    Comprehension Need further instruction;Verbal cues required;Returned demonstration;Verbalized understanding            SLP Short Term Goals - 08/10/20 1647      SLP SHORT TERM GOAL #1   Title pt will complete HEP with rare min A  in 2 sessions    Time 2    Period --   sessions, for all STGs   Status New      SLP SHORT TERM GOAL #2   Title pt will tell SLP why pt is completing HEP with rare min A, then be able to repeat to SLP after 15 minutes with modified independence    Time 2    Status New      SLP SHORT TERM GOAL #3   Title pt will describe 3 overt s/s aspiration PNA with modified independence    Time 2    Status New      SLP SHORT TERM GOAL #4   Title pt  will tell SLP how a food journal could hasten return to a more normalized diet    Time 3    Status New            SLP Long Term Goals - 08/10/20 1649      SLP LONG TERM GOAL #1  Title pt will complete HEP with modified independence over two visits    Time 5    Period --   visits,for all LTGs   Status New      SLP LONG TERM GOAL #2   Title pt will describe how to modify HEP over time, and the timeline associated with reduction in HEP frequency using written cues    Time 6    Status New            Plan - 08/10/20 1644    Clinical Impression Statement At this time pt swallowing is deemed WNL/WFL with softer food items and thin liquids. Pt reported that with granular items such as oatmeal cookie yesterday she req'd liquid wash to clear minor oatmeal pharyngeal residue. SLP designed an individualized HEP for dysphagia and pt completed each exercise on their own with mod-max cues faded to modified independent. SLP wrote some simplified language for 1 exercise for pt. There are no overt s/s aspiration reported by pt at this time. Data indicate that pt's swallow ability will likely decrease over the course of radiation therapy and could very well decline over time following conclusion of their radiation therapy due to muscle disuse atrophy and/or muscle fibrosis. Ms. Marcou will cont to need to be seen by SLP in order to assess safety of PO intake, assess the need for recommending any objective swallow assessment, and ensuring pt correctly completes the individualized HEP.    Speech Therapy Frequency --   once every approx 4 weeks   Duration --   7 total visits   Treatment/Interventions Aspiration precaution training;Pharyngeal strengthening exercises;Diet toleration management by SLP;Trials of upgraded texture/liquids;Patient/family education;SLP instruction and feedback;Compensatory strategies    Potential to Achieve Goals Good    Potential Considerations Ability to learn/carryover  information   pt reports she has memory difficulties   SLP Home Exercise Plan provided today    Consulted and Agree with Plan of Care Patient           Patient will benefit from skilled therapeutic intervention in order to improve the following deficits and impairments:   Dysphagia, unspecified type    Problem List Patient Active Problem List   Diagnosis Date Noted  . Malignant neoplasm of supraglottis (HCC) 07/13/2020  . Herpes zoster without complication 03/24/2020  . GOLD COPD III B 04/23/2018  . Pruritic dermatitis 04/23/2018  . High cholesterol 06/24/2017  . Shortness of breath 06/04/2017  . Coronary artery calcification 06/04/2017  . Other fatigue 06/04/2017  . Centrilobular emphysema (HCC) 05/15/2017  . Atherosclerosis of arteries 05/15/2017  . Poor memory 05/15/2017  . TB lung, latent 04/16/2017  . Lung nodules 04/16/2017  . Diabetes mellitus screening 11/19/2016  . Fibromyalgia 05/15/2015  . Rheumatoid arthritis (HCC) 08/22/2014  . HTN (hypertension) 08/22/2014  . Tobacco abuse 08/22/2014  . Hepatic cirrhosis (HCC) 05/31/2014  . Chronic hepatitis C virus infection (HCC) 05/03/2014    Abundio Teuscher ,MS, CCC-SLP  08/10/2020, 4:50 PM  [facility] [address] [place], [place], [address] Phone: [phone]   Fax:  [phone]  Name: Raven Torres MRN: [id number] Date of Birth: Mar 08, 1956

## 2020-08-10 NOTE — Progress Notes (Signed)
[place] CSW Progress Notes  Met w patient in exam room during [facility].  She reports that her treatment is overall going well - her major concern is significant discomfort around her PEG tube.  She states she feels a "lot of muscle pain every time I move around."  Also says that she has used Aspercreme around the site in order to lessen pain - she was encouraged to discuss this concern w her treatment team.  She does not like to use Osmolite "it fills up my stomach and I like to eat, when I feel full I dont feel like eating."  Eating has always been a source of pleasure for her - she is limiting intake to soft foods ([employer] soup yesterday, for example).  She feels food is a source of comfort and likes to have the sensations of taste and texture which are missing when she gets nutrition through her tube.  She lives w her daughter and two grandsons - moved there after the death of her husband from cancer approx one year ago.  She has multiple family members who have had cancer - her father had tongue cancer and is living - he gives her information on smoothies that he makes for himself as he is unable to eat by mouth.    Her daughter is instrumental in helping her keep track of the multiple concerns involved in undergoing treatment for cancer.  Daughter appears to supervise medications, remind about things that need to be done and similar.  Encouraged patient to share information provided today in clinic w daughter.  Patient states she drives herself to/from appointments and hopes to continue to do so.  Patient admits that she has "memory problems."  States "I have always had a poor memory ever since I was a teenager."  Compensates by keeping lists and appreciates daughter's help. Does not have a diagnosis of dementia, but "[name] worried about it.'  Has family members who have had dementia - patient feels memory is "getting worse" gradually.   She is in regular contact w her PCP.    Edwyna Shell,  LCSW Clinical Social Worker Phone:  [phone]

## 2020-08-10 NOTE — Patient Instructions (Signed)
SWALLOWING EXERCISES Do these until 6 months after your last day of radiation, then 2 times per week afterwards  1. Effortful Swallows - Press your tongue against the roof of your mouth for 3 seconds, then squeeze the muscles in your neck while you swallow your saliva or a sip of water - Repeat 10-15 times, 2-3 times a day, and use whenever you eat or drink  2. Masako Swallow - swallow with your tongue sticking out - Stick tongue out past your lips and gently bite tongue with your teeth - Swallow, while holding your tongue with your teeth - Repeat 10-15 times, 2-3 times a day *use a wet spoon if your mouth gets dry*  3. Pitch Raise - Repeat "he", once per second in as high of a pitch as you can - Repeat 20 times, 2-3 times a day  4. Mendelsohn Maneuver - "half swallow" exercise - Start to swallow, and keep your Adam's apple up by squeezing hard with the muscles of the throat - Hold the squeeze for 5-7 seconds and then relax - Repeat 10-15 times, 2-3 times a day *use a wet spoon if your mouth gets dry*  5. Chin pushback - Open your mouth  - Place your fist UNDER your chin near your neck - Tuck your chin and push back with your fist for 5 seconds - Repeat 10 times, 2-3 times a day       7. "Super Swallow"  (add in on visit #1 due to pain at PEG site at eval)  - Take a breath and hold it  - Bear down (like pushing your bowels)  - Swallow then IMMEDIATELY cough  - Repeat 10 times, 2-3 times a day

## 2020-08-10 NOTE — Progress Notes (Signed)
Oncology Nurse Navigator Documentation  I met with Ms. Daughtridge during head and neck MDC this morning. She reports some muscle pain above her PEG site which has limited her mobility slightly. She is tolerating radiation and chemotherapy at this time. I assisted her back to [facility] for her radiation treatment after [place] completed. Ms. Tallman knows to call me if she has any further questions or concerns.  Harlow Asa RN, BSN, OCN Head & Neck Oncology Nurse [place] at [facility] Phone # [phone]  Fax # [phone]

## 2020-08-11 ENCOUNTER — Ambulatory Visit
Admission: RE | Admit: 2020-08-11 | Discharge: 2020-08-11 | Disposition: A | Discharge status: Home / Self Care | Payer: Medicare Other | Source: Ambulatory Visit | Attending: Radiation Oncology | Admitting: Radiation Oncology

## 2020-08-11 ENCOUNTER — Other Ambulatory Visit: Payer: Self-pay

## 2020-08-11 ENCOUNTER — Telehealth: Payer: Self-pay | Admitting: Hematology and Oncology

## 2020-08-11 ENCOUNTER — Ambulatory Visit: Payer: Medicare Other | Attending: Internal Medicine | Admitting: Internal Medicine

## 2020-08-11 ENCOUNTER — Ambulatory Visit: Payer: Medicare Other | Admitting: Hematology and Oncology

## 2020-08-11 ENCOUNTER — Inpatient Hospital Stay (HOSPITAL_BASED_OUTPATIENT_CLINIC_OR_DEPARTMENT_OTHER): Payer: Medicare Other | Admitting: Hematology and Oncology

## 2020-08-11 ENCOUNTER — Telehealth: Payer: Self-pay | Admitting: Internal Medicine

## 2020-08-11 ENCOUNTER — Inpatient Hospital Stay: Payer: Medicare Other | Attending: Hematology and Oncology

## 2020-08-11 ENCOUNTER — Encounter: Payer: Self-pay | Admitting: Internal Medicine

## 2020-08-11 VITALS — BP 132/50 | HR 80 | Temp 98.2°F | Resp 16 | Wt 143.8 lb

## 2020-08-11 DIAGNOSIS — I10 Essential (primary) hypertension: Secondary | ICD-10-CM

## 2020-08-11 DIAGNOSIS — M0579 Rheumatoid arthritis with rheumatoid factor of multiple sites without organ or systems involvement: Secondary | ICD-10-CM | POA: Diagnosis not present

## 2020-08-11 DIAGNOSIS — I2510 Atherosclerotic heart disease of native coronary artery without angina pectoris: Secondary | ICD-10-CM

## 2020-08-11 DIAGNOSIS — J432 Centrilobular emphysema: Secondary | ICD-10-CM

## 2020-08-11 DIAGNOSIS — C321 Malignant neoplasm of supraglottis: Secondary | ICD-10-CM

## 2020-08-11 DIAGNOSIS — F17200 Nicotine dependence, unspecified, uncomplicated: Secondary | ICD-10-CM | POA: Diagnosis not present

## 2020-08-11 DIAGNOSIS — Z5111 Encounter for antineoplastic chemotherapy: Secondary | ICD-10-CM | POA: Insufficient documentation

## 2020-08-11 DIAGNOSIS — C329 Malignant neoplasm of larynx, unspecified: Secondary | ICD-10-CM

## 2020-08-11 DIAGNOSIS — F17210 Nicotine dependence, cigarettes, uncomplicated: Secondary | ICD-10-CM | POA: Insufficient documentation

## 2020-08-11 DIAGNOSIS — I2583 Coronary atherosclerosis due to lipid rich plaque: Secondary | ICD-10-CM

## 2020-08-11 DIAGNOSIS — K7469 Other cirrhosis of liver: Secondary | ICD-10-CM | POA: Diagnosis not present

## 2020-08-11 LAB — CBC WITH DIFFERENTIAL/PLATELET
Abs Immature Granulocytes: 0.01 10*3/uL (ref 0.00–0.07)
Basophils Absolute: 0 10*3/uL (ref 0.0–0.1)
Basophils Relative: 0 %
Eosinophils Absolute: 0 10*3/uL (ref 0.0–0.5)
Eosinophils Relative: 0 %
HCT: 35.2 % — ABNORMAL LOW (ref 36.0–46.0)
Hemoglobin: 11.8 g/dL — ABNORMAL LOW (ref 12.0–15.0)
Immature Granulocytes: 0 %
Lymphocytes Relative: 12 %
Lymphs Abs: 0.6 10*3/uL — ABNORMAL LOW (ref 0.7–4.0)
MCH: 30.8 pg (ref 26.0–34.0)
MCHC: 33.5 g/dL (ref 30.0–36.0)
MCV: 91.9 fL (ref 80.0–100.0)
Monocytes Absolute: 0.5 10*3/uL (ref 0.1–1.0)
Monocytes Relative: 10 %
Neutro Abs: 3.8 10*3/uL (ref 1.7–7.7)
Neutrophils Relative %: 78 %
Platelets: 129 10*3/uL — ABNORMAL LOW (ref 150–400)
RBC: 3.83 MIL/uL — ABNORMAL LOW (ref 3.87–5.11)
RDW: 13.4 % (ref 11.5–15.5)
WBC: 5 10*3/uL (ref 4.0–10.5)
nRBC: 0 % (ref 0.0–0.2)

## 2020-08-11 LAB — BASIC METABOLIC PANEL
Anion gap: 7 (ref 5–15)
BUN: 18 mg/dL (ref 8–23)
CO2: 30 mmol/L (ref 22–32)
Calcium: 9 mg/dL (ref 8.9–10.3)
Chloride: 107 mmol/L (ref 98–111)
Creatinine, Ser: 0.63 mg/dL (ref 0.44–1.00)
GFR, Estimated: >60 mL/min (ref >60)
Glucose, Bld: 122 mg/dL — ABNORMAL HIGH (ref 70–99)
Potassium: 3.8 mmol/L (ref 3.5–5.1)
Sodium: 144 mmol/L (ref 135–145)

## 2020-08-11 LAB — MAGNESIUM: Magnesium: 1.7 mg/dL (ref 1.7–2.4)

## 2020-08-11 NOTE — Progress Notes (Signed)
Pt did arrive on the video call Called her number multiple times, no answer She will have to be rescheduled at this time.  Benay Pike MD

## 2020-08-11 NOTE — Progress Notes (Signed)
Information provided to Dr Chryl Heck:  Cisplatin is compatible with [name]'s Plaquenil and Sulfasalazine per [employer] for concomittant use while receiving Cisplatin.  Also requested could these oral medications be given via PEG.  Both plaquenil and sulfasalazine should not be crushed but can be prepared extemporaneously by a pharmacy for PEG administration.  Please see [employer] for compounding instructions.  Henreitta Leber, PharmD

## 2020-08-11 NOTE — Telephone Encounter (Signed)
-----   Message from Benay Pike, MD sent at 08/11/2020  3:18 PM EST ----- Dr Wynetta Emery  Thanks for reaching out to me. No contraindications to those medications from our stand point, but they may not be crushed according to our pharmacist. If she can swallow it, she cna go ahead and take it Instead if rheumatologist wishes they may be able to contact a compounding pharmacy who may crush it and mix it with other products to use via PEG. I will forward the list of pharmacies who can do that if you would like me to.  [place].   ----- Message ----- From: Ladell Pier, MD Sent: 08/11/2020   1:29 PM EST To: Benay Pike, MD  [name] the primary care physician for this patient.  I saw her today in routine follow-up visit.  She has stopped taking most of her medications because of problems swallowing.  However I note that she does have a PEG tube and have encouraged her to use it.  I wanted to touch base with you just to clarify whether she is able to continue the Plaquenil and sulfasalazine which she is on through rheumatology for rheumatoid arthritis.  She has not seen her rheumatologist in several months and has not touch base with him to let him know that she is receiving chemotherapy for laryngeal cancer.  I wanted to know whether the 2 medicines can be continued with the chemotherapy that she is receiving.  If so I will let her know so that she continues taking them to prevent any flare in her rheumatoid arthritis.

## 2020-08-11 NOTE — Progress Notes (Signed)
Patient ID: Raven Torres, female    DOB: 1956-04-10  MRN: [id number]  CC: Hypertension   Subjective: Raven Torres is a 65 y.o. female who presents for chronic ds management Her concerns today include:  Patient with history of supraglottic laryngeal CA, HTN, cured hepatitis C with cirrhosis, adjustment disorder, HL, tobacco dependence, and rheumatoid arthritis/fibromyalgia(Dr. Posey Pronto at [place]) , memory decline(MMSE 05/2019 was 30/30), COPD GOLD III, lung nodules (Dr. Vaughan Browner), coronary atherosclerosis.  Laryngeal CA: currently receiving XRT and chem.  PET done 06/2020 showed no met sites.  -appetite "not that great" but eating a lot of soups.  Also drinks Ensure.  Has a PEG tube and chem port.  Told she will lose the ability to swallow soon due to XRT.  She can already tell a difference with her swallowing. -smoking 1 cigarette a day.  Still trying to put it down. -stopped most of her meds x 2 wks including her BP med Lisinopril/HCTZ.  Having problems swallowing her meds.   RA: She is not made contact with her rheumatologist since her cancer diagnosis.  She stopped taking the Plaquenil and sulfasalazine because she was having problems swallowing the pills.   Slight flare of arthritis symptoms in lower back  COPD: using Bevespi once a day instead of twice daily and Albuterol once a day.  Breathing not the best but stable.  Coronary atheroscl: stop lipitor again because she was having problems swallowing the pill  HTN: no device to check blood pressure.  However I note blood pressure readings in the system from her visits with other specialists including the oncologist.  Blood pressure readings have been good. Patient Active Problem List   Diagnosis Date Noted  . Malignant neoplasm of supraglottis ([place]) 07/13/2020  . Herpes zoster without complication 99/37/1696  . GOLD COPD III B 04/23/2018  . Pruritic dermatitis 04/23/2018  . High cholesterol 06/24/2017  . Shortness of breath  06/04/2017  . Coronary artery calcification 06/04/2017  . Other fatigue 06/04/2017  . Centrilobular emphysema ([place]) 05/15/2017  . Atherosclerosis of arteries 05/15/2017  . Poor memory 05/15/2017  . TB lung, latent 04/16/2017  . Lung nodules 04/16/2017  . Diabetes mellitus screening 11/19/2016  . Fibromyalgia 05/15/2015  . Rheumatoid arthritis ([place]) 08/22/2014  . HTN (hypertension) 08/22/2014  . Tobacco abuse 08/22/2014  . Hepatic cirrhosis ([place]) 05/31/2014  . Chronic hepatitis C virus infection ([place]) 05/03/2014     Current Outpatient Medications on File Prior to Visit  Medication Sig Dispense Refill  . albuterol (PROVENTIL) (2.5 MG/3ML) 0.083% nebulizer solution Take 3 mLs (2.5 mg total) by nebulization every 6 (six) hours as needed for wheezing or shortness of breath. 150 mL 1  . albuterol (VENTOLIN HFA) 108 (90 Base) MCG/ACT inhaler INHALE 2 PUFFS INTO THE LUNGS EVERY 6 HOURS AS NEEDED FOR WHEEZING OR SHORTNESS OF BREATH (Patient taking differently: Inhale 2 puffs into the lungs every 6 (six) hours as needed for wheezing or shortness of breath. INHALE 2 PUFFS INTO THE LUNGS EVERY 6 HOURS AS NEEDED FOR WHEEZING OR SHORTNESS OF BREATH) 8.5 g 3  . aspirin EC 81 MG tablet Take 1 tablet (81 mg total) by mouth daily. (Patient not taking: Reported on 08/01/2020) 30 tablet 0  . atorvastatin (LIPITOR) 40 MG tablet TAKE 1 TABLET(40 MG) BY MOUTH DAILY AT 6 PM (Patient not taking: Reported on 08/01/2020) 30 tablet 2  . Baclofen 5 MG TABS Take 5 mg by mouth at bedtime.    Marland Kitchen BEVESPI AEROSPHERE 9-4.8  MCG/ACT AERO INHALE 2 PUFFS INTO THE LUNGS TWICE DAILY (Patient taking differently: Inhale 2 puffs into the lungs in the morning and at bedtime.) 10.7 g 1  . buPROPion (WELLBUTRIN) 100 MG tablet TAKE 1 TABLET(100 MG) BY MOUTH TWICE DAILY (Patient not taking: Reported on 08/01/2020) 60 tablet 2  . CALCIUM CARBONATE-VIT D-MIN PO Take 1,200 mg by mouth daily.  (Patient not taking: Reported on 08/01/2020)    .  dexamethasone (DECADRON) 4 MG tablet Take 2 tablets (8 mg total) by mouth daily. Take daily x 3 days starting the day after cisplatin chemotherapy. Take with food. (Patient not taking: Reported on 08/01/2020) 30 tablet 1  . diclofenac Sodium (VOLTAREN) 1 % GEL Place 1 application onto the skin 4 (four) times daily as needed. (Patient not taking: Reported on 08/01/2020)    . folic acid (FOLVITE) 1 MG tablet Take 1 mg by mouth daily.  (Patient not taking: Reported on 08/01/2020)  3  . gabapentin (NEURONTIN) 100 MG capsule Take 2 capsules (200 mg total) by mouth 2 (two) times daily. (Patient not taking: Reported on 08/01/2020) 56 capsule 0  . hydroxychloroquine (PLAQUENIL) 200 MG tablet Take 200 mg by mouth 2 (two) times daily. (Patient not taking: Reported on 08/01/2020)    . lidocaine (XYLOCAINE) 2 % solution Patient: Mix 1part 2% viscous lidocaine, 1part H20. Swallow 35m of diluted mixture, 321m before meals and at bedtime, up to QID (Patient not taking: Reported on 08/01/2020) 200 mL 4  . lidocaine-prilocaine (EMLA) cream Apply to affected area once (Patient not taking: Reported on 08/01/2020) 30 g 3  . LORazepam (ATIVAN) 0.5 MG tablet Take 1 tablet (0.5 mg total) by mouth every 6 (six) hours as needed (Nausea or vomiting). (Patient not taking: Reported on 08/01/2020) 30 tablet 0  . ondansetron (ZOFRAN) 8 MG tablet Take 1 tablet (8 mg total) by mouth 2 (two) times daily as needed. Start on the third day after cisplatin chemotherapy. (Patient not taking: Reported on 08/01/2020) 30 tablet 1  . prochlorperazine (COMPAZINE) 10 MG tablet Take 1 tablet (10 mg total) by mouth every 6 (six) hours as needed (Nausea or vomiting). (Patient not taking: Reported on 08/01/2020) 30 tablet 1  . sulfaSALAzine (AZULFIDINE) 500 MG EC tablet Take 500 mg by mouth 2 (two) times daily.  (Patient not taking: Reported on 08/01/2020)    . valACYclovir (VALTREX) 1000 MG tablet Take 1 tablet (1,000 mg total) by mouth 3 (three)  times daily. (Patient not taking: Reported on 08/01/2020) 21 tablet 0   No current facility-administered medications on file prior to visit.    Allergies  Allergen Reactions  . Tudorza Pressair [Aclidinium Bromide] Itching  . Amitriptyline Other (See Comments)    Eye spasms per pt  . Codeine Nausea Only  . Tramadol Nausea Only    Social History   Socioeconomic History  . Marital status: Married    Spouse name: Not on file  . Number of children: Not on file  . Years of education: Not on file  . Highest education level: Not on file  Occupational History  . Not on file  Tobacco Use  . Smoking status: Current Every Day Smoker    Packs/day: 1.00    Years: 50.00    Pack years: 50.00    Types: Cigarettes    Start date: 1954. Smokeless tobacco: Never Used  . Tobacco comment: 4 cigs a day   Vaping Use  . Vaping Use: Never used  Substance and Sexual  Activity  . Alcohol use: No    Alcohol/week: 0.0 standard drinks  . Drug use: No  . Sexual activity: Not Currently  Other Topics Concern  . Not on file  Social History Narrative  . Not on file   Social Determinants of Health   Financial Resource Strain: Not on file  Food Insecurity: No Food Insecurity  . Worried About Charity fundraiser in the Last Year: Never true  . Ran Out of Food in the Last Year: Never true  Transportation Needs: No Transportation Needs  . Lack of Transportation (Medical): No  . Lack of Transportation (Non-Medical): No  Physical Activity: Not on file  Stress: Not on file  Social Connections: Not on file  Intimate Partner Violence: Not on file    Family History  Problem Relation Age of Onset  . Heart disease Mother   . Cancer Mother   . Melanoma Mother   . Esophageal cancer Father   . Colon cancer Neg Hx   . Colon polyps Neg Hx   . Rectal cancer Neg Hx   . Stomach cancer Neg Hx     Past Surgical History:  Procedure Laterality Date  . ABDOMINAL HYSTERECTOMY    . CATARACT EXTRACTION,  BILATERAL    . COLONOSCOPY    . COLONOSCOPY  04/04/2020  . DIRECT LARYNGOSCOPY N/A 06/26/2020   Procedure: DIRECT LARYNGOSCOPY AND BIOPSY;  Surgeon: Rozetta Nunnery, MD;  Location: [facility];  Service: ENT;  Laterality: N/A;  . IR GASTROSTOMY TUBE MOD SED  07/27/2020  . IR IMAGING GUIDED PORT INSERTION  07/27/2020  . KNEE SURGERY Right   . POLYPECTOMY  11/17/2008   HPP x 1     ROS: Review of Systems Negative except as stated above  PHYSICAL EXAM: BP (!) 132/50   Pulse 80   Temp 98.2 F (36.8 C)   Resp 16   Wt 143 lb 12.8 oz (65.2 kg)   SpO2 96%   BMI 23.21 kg/m   Wt Readings from Last 3 Encounters:  08/11/20 143 lb 12.8 oz (65.2 kg)  08/01/20 149 lb 6.4 oz (67.8 kg)  07/14/20 148 lb 12.8 oz (67.5 kg)    Physical Exam  General appearance -pleasant elderly female in NAD.  She appears chronically ill. Mental status -mild forgetfulness but answers questions appropriately.   Mouth -she has dentures above.  No oral lesions noted Neck -mass palpated on the right side of the neck laterally.  Patient states this is decreased in size since she has began chemo and radiation. Chest -breath sounds mild to moderately decreased.  No wheezes or crackles heard at this time. Heart - normal rate, regular rhythm, normal S1, S2, no murmurs, rubs, clicks or gallops Extremities -no lower extremity edema  Depression screen [facility] 2/9 08/11/2020 06/08/2020 01/07/2020  Decreased Interest 0 0 0  Down, Depressed, Hopeless 1 0 0  PHQ - 2 Score 1 0 0  Altered sleeping - 0 -  Tired, decreased energy - 0 -  Change in appetite - 1 -  Feeling bad or failure about yourself  - 0 -  Trouble concentrating - 0 -  Moving slowly or fidgety/restless - 0 -  Suicidal thoughts - 0 -  PHQ-9 Score - 1 -  Difficult doing work/chores - - -  Some recent data might be hidden    CMP Latest Ref Rng & Units 08/11/2020 08/01/2020 07/25/2020  Glucose 70 - 99 mg/dL 122(H) 94 98  BUN 8 - 23  mg/dL '18 8 10  ' Creatinine 0.44  - 1.00 mg/dL 0.63 0.67 0.79  Sodium 135 - 145 mmol/L 144 139 137  Potassium 3.5 - 5.1 mmol/L 3.8 4.0 3.6  Chloride 98 - 111 mmol/L 107 105 105  CO2 22 - 32 mmol/L '30 29 25  ' Calcium 8.9 - 10.3 mg/dL 9.0 9.2 9.1  Total Protein 6.5 - 8.1 g/dL - - -  Total Bilirubin 0.3 - 1.2 mg/dL - - -  Alkaline Phos 38 - 126 U/L - - -  AST 15 - 41 U/L - - -  ALT 0 - 44 U/L - - -   Lipid Panel     Component Value Date/Time   CHOL 133 06/02/2020 0845   TRIG 84 06/02/2020 0845   HDL 67 06/02/2020 0845   CHOLHDL 2.0 06/02/2020 0845   CHOLHDL 2.5 02/26/2016 1132   VLDL 28 02/26/2016 1132   LDLCALC 50 06/02/2020 0845    CBC    Component Value Date/Time   WBC 5.0 08/11/2020 1153   RBC 3.83 (L) 08/11/2020 1153   HGB 11.8 (L) 08/11/2020 1153   HGB 13.8 06/02/2020 0845   HCT 35.2 (L) 08/11/2020 1153   HCT 39.7 06/02/2020 0845   PLT 129 (L) 08/11/2020 1153   PLT 114 (L) 06/02/2020 0845   MCV 91.9 08/11/2020 1153   MCV 92 06/02/2020 0845   MCH 30.8 08/11/2020 1153   MCHC 33.5 08/11/2020 1153   RDW 13.4 08/11/2020 1153   RDW 13.0 06/02/2020 0845   LYMPHSABS 0.6 (L) 08/11/2020 1153   LYMPHSABS 2.9 11/19/2016 1223   MONOABS 0.5 08/11/2020 1153   EOSABS 0.0 08/11/2020 1153   EOSABS 0.2 11/19/2016 1223   BASOSABS 0.0 08/11/2020 1153   BASOSABS 0.0 11/19/2016 1223    ASSESSMENT AND PLAN: 1. Essential hypertension Blood pressure looks good considering she has been off the lisinopril/HCTZ for 2 weeks.  She likely will not need the HCTZ component since she has had decreased oral intake.  I will hold off on restarting the medication.  However patient advised that if blood pressure starts rising above 130/80 she should let me know so that we can restart just the lisinopril component.  2. Centrilobular emphysema ([place]) Advised to use Bevespi twice a day as prescribed.  3. Tobacco dependence Continue to encourage her to try to quit.  4. Rheumatoid arthritis involving multiple sites with positive  rheumatoid factor ([place]) Advised that she touch base with her rheumatologist so that he is aware that she is on chemotherapy so that we can advise if any adjustment in her medications are needed.  I will also send a message to her oncologist asking about whether the sulfasalazine and Plaquenil can be continued with her chemotherapy.  If they can be continued patient advised to administer pills through her PEG tube  5. Malignant neoplasm of supraglottis [facility]) Being managed by ENT and oncology  6. Other cirrhosis of liver ([place])   7. Coronary atherosclerosis due to lipid rich plaque Patient to continue Lipitor.   Patient was given the opportunity to ask questions.  Patient verbalized understanding of the plan and was able to repeat key elements of the plan.   No orders of the defined types were placed in this encounter.    Requested Prescriptions    No prescriptions requested or ordered in this encounter    Return in about 2 months (around 10/09/2020) for Medicare Wellness Visit.  Karle Plumber, MD, FACP

## 2020-08-11 NOTE — Patient Instructions (Addendum)
Please call your rheumatologist Dr. Posey Pronto and let him know of your cancer diagnosis and that you are receiving chemotherapy.  Let him know that you have a PEG tube and inquire whether you should continue taking your rheumatoid arthritis medications.  I will also send a message to your oncologist to inquire whether it is okay for you to continue taking the Plaquenil and sulfasalazine.  Your blood pressure seems to be doing good in spite of you not taking the lisinopril/hydrochlorothiazide for the past 2 weeks.  I recommend holding the blood pressure medication for now.  If your blood pressure starts to increase above 130/80, please call me and let me know.  At that point we will restart lisinopril only without the hydrochlorothiazide component.  Continue to use your inhaler called Bevespi twice a day as prescribed.   You can continue the Wellbutrin and the Lipitor.  You should be able to crush your medicine and put them in through your PEG/feeding tube.

## 2020-08-11 NOTE — Telephone Encounter (Signed)
Rescheduled per provider. Called pt and left a msg that her appt today needed to be changed to a mychart visit due to provider being out of office.

## 2020-08-11 NOTE — Progress Notes (Signed)
[facility] NOTE  Patient Care Team: Ladell Pier, MD as PCP - General (Internal Medicine) Debara Pickett Nadean Corwin, MD as PCP - Cardiology (Cardiology)  CHIEF COMPLAINTS/PURPOSE OF CONSULTATION:   Supraglottic cancer   ASSESSMENT & PLAN:  No problem-specific Assessment & Plan notes found for this encounter.  No orders of the defined types were placed in this encounter.  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 We have discussed her in the ENT in multidisciplinary conference and there was a question if the T staging was a T3 versus a T4.  But since there is no conclusive evidence of T4 and since the patient also prefers no surgery at this time, we believe it is reasonable to proceed with concurrent chemoradiation.  I have discussed weekly cisplatin concurrent with radiation.   I have discussed that concurrent chemoradiation increases the rate of laryngeal preservation. We have discussed about adverse effects including but not limited to fatigue, nausea, vomiting, cytopenias, nephrotoxicity, hearing loss, and increased risk of infections, reactivation of tuberculosis including death.  Most common side effects however fatigue, nausea, mucositis and cytopenias.  Given her underlying cirrhosis, baseline platelet count of around 100,000, we may have to monitor CBC weekly. She was agreeable to proceed with concurrent chemoradiation.  No need for baseline audiology evaluation since patient declines any hearing deficit. Pt is edentulous, so no dental evaluation needed. She  received 2 weekly cycles of chemotherapy. She is here for telephone FU, she is tolerating the chemotherapy very well. No concerns for toxicity Labs reviewed, ok to proceed with chemo tomorrow. Continue chemo as planned. Mag will be replaced per protocol She will return to clinic in 1 week, will need weekly FU, labs and infusion appointments.    Thank you for consulting [place] in the care of this patient.  Please do not hesitate to contact us with any additional questions or concerns.  HISTORY OF PRESENTING ILLNESS:   Raven Torres 65 y.o. female is here because of new diagnosis of laryngeal cancer  Chronology  Raven Torres is a 66 y.o. female who presents for evaluation of weight loss and enlarged right neck node.  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 She has had a approximate 30 pound weight loss over the past 4 months.  She is having no airway problems or hoarseness.Raven Torres is a 65 y.o. female who presents for evaluation of weight loss and enlarged right neck node. She has had a approximate 30 pound weight loss over the past 4 months.  She has no hoarseness or airway problems. On fiberoptic laryngoscopy she has abnormality of the laryngeal surface of the epiglottis and is taken to the operating room for direct laryngoscopy and biopsy.  06/16/2020 2.0 x 3.2 x 2.5 cm supraglottic laryngeal mass involving the epiglottis and extending along the aryepiglottic folds bilaterally. Tumor extends into the pre-epiglottic fat and likely into the vallecula. Tumor extends into the upper thyroid cartilage bilaterally, abutting and possibly extending through the  outer cortex (particularly on the left). This almost certainly reflects a supraglottic laryngeal squamous cell carcinoma  06/26/2020, she had laryngoscopy which showed  there was a friable ulcerative tumor involving predominantly the  supraglottic area above the vocal cords as the vocal cords were clear to evaluation.  This extended up the laryngeal surface of epiglottis, more on the right side. A direct laryngoscopy was also performed  of the piriform sinuses which were clear bilaterally.  06/28/2020  1. The supraglottic mass has a maximum SUV of 12.5 and the enlarged right level II lymph node has a maximum SUV of 16.9, both compatible with malignancy. No findings of metastatic disease to the chest, abdomen, pelvis, or regional skeleton. 2. New lingular ground-glass opacity compared to 01/20/2020 with very low-grade activity, probably from alveolitis or low-grade atypical infection. 3. Aortic Atherosclerosis (ICD10-I70.0) and Emphysema (ICD10-J43.9). 4. Airway thickening is present, suggesting bronchitis or reactive airways disease. 5. 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 6.  Prominent stool throughout the colon favors constipation.  Her case was discussed in the tumor board, and radiology review suggested that there is no definitive evidence of T4 involvement, hence plan was to consider concurrent chemoradiation and thus medical oncology referral.  She has baseline history of COPD, not oxygen dependent. She is able to perform her all ADL's. She says memory has been an issue, she is forgetful at times. She had TB 3 yrs ago and was treated for this at [facility]. She has RA, takes plaquenil and sulfasalazine  Hep C induced cirrhosis, Hep C cured by Interferon, cirrhosis well compensated. She is very anxious, lives with her daughter,  She smoked about 1-2 PPD and is now smoking about 2 packs in a week. No alcohol, only when she was very young. She denies any pain in her neck, swallowing, speech or hearing.  Started concurrent chemoradiation on 07/25/2020 She is doing really  well, some soreness in her throat, but able to swallow everything, doesn't need any prescription medication, she thinks chemo is much easier than radiation. Mild swallowing difficulty, no nausea, vomiting , hearing loss, change in urination No fevers, chills, worsening cough, SOB, diarrhea concerning for an infection. She is able to drink boost daily,  No pain around PEG tube Rest of the pertinent 10 point ROS reviewed and negative  MEDICAL HISTORY:  Past Medical History:  Diagnosis Date  . Arthritis   . Cataract    removed both eyes   . Colitis   . COPD (chronic obstructive pulmonary disease) ([place])   . Hepatitis C   . Hyperlipidemia   . Hypertension   . Shingles 03/27/2020  . TB (tuberculosis)    treatment     SURGICAL HISTORY: Past Surgical History:  Procedure Laterality Date  . ABDOMINAL HYSTERECTOMY    . CATARACT EXTRACTION, BILATERAL    . COLONOSCOPY    . COLONOSCOPY  04/04/2020  . DIRECT LARYNGOSCOPY N/A 06/26/2020   Procedure: DIRECT LARYNGOSCOPY AND BIOPSY;  Surgeon: Rozetta Nunnery, MD;  Location: [facility];  Service: ENT;  Laterality: N/A;  . IR GASTROSTOMY TUBE MOD SED  07/27/2020  . IR IMAGING GUIDED PORT INSERTION  07/27/2020  . KNEE SURGERY Right   . POLYPECTOMY  11/17/2008   HPP x 1     SOCIAL HISTORY: Social History   Socioeconomic History  . Marital status: Married    Spouse name: Not on file  . Number of children: Not on file  . Years of  education: Not on file  . Highest education level: Not on file  Occupational History  . Not on file  Tobacco Use  . Smoking status: Current Every Day Smoker    Packs/day: 1.00    Years: 50.00    Pack years: 50.00    Types: Cigarettes    Start date: 41  . Smokeless tobacco: Never Used  . Tobacco comment: 4 cigs a day   Vaping Use  . Vaping Use: Never used  Substance and Sexual Activity  . Alcohol use: No    Alcohol/week: 0.0 standard drinks  . Drug use: No  . Sexual activity: Not Currently  Other  Topics Concern  . Not on file  Social History Narrative  . Not on file   Social Determinants of Health   Financial Resource Strain: Not on file  Food Insecurity: No Food Insecurity  . Worried About Charity fundraiser in the Last Year: Never true  . Ran Out of Food in the Last Year: Never true  Transportation Needs: No Transportation Needs  . Lack of Transportation (Medical): No  . Lack of Transportation (Non-Medical): No  Physical Activity: Not on file  Stress: Not on file  Social Connections: Not on file  Intimate Partner Violence: Not on file    FAMILY HISTORY: Family History  Problem Relation Age of Onset  . Heart disease Mother   . Cancer Mother   . Melanoma Mother   . Esophageal cancer Father   . Colon cancer Neg Hx   . Colon polyps Neg Hx   . Rectal cancer Neg Hx   . Stomach cancer Neg Hx     ALLERGIES:  is allergic to tudorza pressair [aclidinium bromide], amitriptyline, codeine, and tramadol.  MEDICATIONS:  Current Outpatient Medications  Medication Sig Dispense Refill  . albuterol (PROVENTIL) (2.5 MG/3ML) 0.083% nebulizer solution Take 3 mLs (2.5 mg total) by nebulization every 6 (six) hours as needed for wheezing or shortness of breath. 150 mL 1  . albuterol (VENTOLIN HFA) 108 (90 Base) MCG/ACT inhaler INHALE 2 PUFFS INTO THE LUNGS EVERY 6 HOURS AS NEEDED FOR WHEEZING OR SHORTNESS OF BREATH (Patient taking differently: Inhale 2 puffs into the lungs every 6 (six) hours as needed for wheezing or shortness of breath. INHALE 2 PUFFS INTO THE LUNGS EVERY 6 HOURS AS NEEDED FOR WHEEZING OR SHORTNESS OF BREATH) 8.5 g 3  . aspirin EC 81 MG tablet Take 1 tablet (81 mg total) by mouth daily. (Patient not taking: Reported on 08/01/2020) 30 tablet 0  . atorvastatin (LIPITOR) 40 MG tablet TAKE 1 TABLET(40 MG) BY MOUTH DAILY AT 6 PM (Patient not taking: Reported on 08/01/2020) 30 tablet 2  . Baclofen 5 MG TABS Take 5 mg by mouth at bedtime.    Marland Kitchen BEVESPI AEROSPHERE 9-4.8 MCG/ACT  AERO INHALE 2 PUFFS INTO THE LUNGS TWICE DAILY (Patient taking differently: Inhale 2 puffs into the lungs in the morning and at bedtime.) 10.7 g 1  . buPROPion (WELLBUTRIN) 100 MG tablet TAKE 1 TABLET(100 MG) BY MOUTH TWICE DAILY (Patient not taking: Reported on 08/01/2020) 60 tablet 2  . CALCIUM CARBONATE-VIT D-MIN PO Take 1,200 mg by mouth daily.  (Patient not taking: Reported on 08/01/2020)    . dexamethasone (DECADRON) 4 MG tablet Take 2 tablets (8 mg total) by mouth daily. Take daily x 3 days starting the day after cisplatin chemotherapy. Take with food. (Patient not taking: Reported on 08/01/2020) 30 tablet 1  . diclofenac Sodium (VOLTAREN) 1 %  GEL Place 1 application onto the skin 4 (four) times daily as needed. (Patient not taking: Reported on 08/01/2020)    . folic acid (FOLVITE) 1 MG tablet Take 1 mg by mouth daily.  (Patient not taking: Reported on 08/01/2020)  3  . gabapentin (NEURONTIN) 100 MG capsule Take 2 capsules (200 mg total) by mouth 2 (two) times daily. (Patient not taking: Reported on 08/01/2020) 56 capsule 0  . hydroxychloroquine (PLAQUENIL) 200 MG tablet Take 200 mg by mouth 2 (two) times daily. (Patient not taking: Reported on 08/01/2020)    . lidocaine (XYLOCAINE) 2 % solution Patient: Mix 1part 2% viscous lidocaine, 1part H20. Swallow 91mL of diluted mixture, 51min before meals and at bedtime, up to QID (Patient not taking: Reported on 08/01/2020) 200 mL 4  . lidocaine-prilocaine (EMLA) cream Apply to affected area once (Patient not taking: Reported on 08/01/2020) 30 g 3  . LORazepam (ATIVAN) 0.5 MG tablet Take 1 tablet (0.5 mg total) by mouth every 6 (six) hours as needed (Nausea or vomiting). (Patient not taking: Reported on 08/01/2020) 30 tablet 0  . ondansetron (ZOFRAN) 8 MG tablet Take 1 tablet (8 mg total) by mouth 2 (two) times daily as needed. Start on the third day after cisplatin chemotherapy. (Patient not taking: Reported on 08/01/2020) 30 tablet 1  .  prochlorperazine (COMPAZINE) 10 MG tablet Take 1 tablet (10 mg total) by mouth every 6 (six) hours as needed (Nausea or vomiting). (Patient not taking: Reported on 08/01/2020) 30 tablet 1  . sulfaSALAzine (AZULFIDINE) 500 MG EC tablet Take 500 mg by mouth 2 (two) times daily.  (Patient not taking: Reported on 08/01/2020)    . valACYclovir (VALTREX) 1000 MG tablet Take 1 tablet (1,000 mg total) by mouth 3 (three) times daily. (Patient not taking: Reported on 08/01/2020) 21 tablet 0   No current facility-administered medications for this visit.    PHYSICAL EXAMINATION: ECOG PERFORMANCE STATUS: 0 - Asymptomatic  There were no vitals filed for this visit. There were no vitals filed for this visit.  PE not done, telephone visit.  LABORATORY DATA:  I have reviewed the data as listed Lab Results  Component Value Date   WBC 5.0 08/11/2020   HGB 11.8 (L) 08/11/2020   HCT 35.2 (L) 08/11/2020   MCV 91.9 08/11/2020   PLT 129 (L) 08/11/2020     Chemistry      Component Value Date/Time   NA 144 08/11/2020 1153   NA 139 06/02/2020 0845   K 3.8 08/11/2020 1153   CL 107 08/11/2020 1153   CO2 30 08/11/2020 1153   BUN 18 08/11/2020 1153   BUN 12 06/02/2020 0845   CREATININE 0.63 08/11/2020 1153   CREATININE 0.70 02/26/2016 1143      Component Value Date/Time   CALCIUM 9.0 08/11/2020 1153   ALKPHOS 44 06/26/2020 0658   AST 30 06/26/2020 0658   ALT 15 06/26/2020 0658   BILITOT 0.7 06/26/2020 0658   BILITOT 0.5 06/02/2020 0845       RADIOGRAPHIC STUDIES: I have personally reviewed the radiological images as listed and agreed with the findings in the report. IR Gastrostomy Tube  Result Date: 07/27/2020 INDICATION: Metastatic head and neck cancer (laryngeal cancer) EXAM: FLUOROSCOPIC 20 FRENCH PULL-THROUGH GASTROSTOMY Date:  07/27/2020 07/27/2020 10:42 am Radiologist:  M. Daryll Brod, MD Guidance:  Fluoroscopic MEDICATIONS: Ancef 2 g; Antibiotics were administered within 1 hour of the  procedure. Glucagon 0.5 mg IV ANESTHESIA/SEDATION: Versed 1 mg IV; Fentanyl 100 mcg IV Moderate  Sedation Time:  16 The patient was continuously monitored during the procedure by the interventional radiology nurse under my direct supervision. CONTRAST:  14mL OMNIPAQUE IOHEXOL 300 MG/ML SOLN - administered into the gastric lumen. FLUOROSCOPY TIME:  Fluoroscopy Time: 3 minutes 36 seconds (37 mGy). COMPLICATIONS: None immediate. PROCEDURE: Informed consent was obtained from the patient following explanation of the procedure, risks, benefits and alternatives. The patient understands, agrees and consents for the procedure. All questions were addressed. A time out was performed. Maximal barrier sterile technique utilized including caps, mask, sterile gowns, sterile gloves, large sterile drape, hand hygiene, and betadine prep. The left upper quadrant was sterilely prepped and draped. An oral gastric catheter was inserted into the stomach under fluoroscopy. The existing nasogastric feeding tube was removed. Air was injected into the stomach for insufflation and visualization under fluoroscopy. The air distended stomach was confirmed beneath the anterior abdominal wall in the frontal and lateral projections. Under sterile conditions and local anesthesia, a 40 gauge trocar needle was utilized to access the stomach percutaneously beneath the left subcostal margin. Needle position was confirmed within the stomach under biplane fluoroscopy. Contrast injection confirmed position also. A single T tack was deployed for gastropexy. Over an Amplatz guide wire, a 9-French sheath was inserted into the stomach. A snare device was utilized to capture the oral gastric catheter. The snare device was pulled retrograde from the stomach up the esophagus and out the oropharynx. The 20-French pull-through gastrostomy was connected to the snare device and pulled antegrade through the oropharynx down the esophagus into the stomach and then through  the percutaneous tract external to the patient. The gastrostomy was assembled externally. Contrast injection confirms position in the stomach. Images were obtained for documentation. The patient tolerated procedure well. No immediate complication. IMPRESSION: Fluoroscopic insertion of a 20-French "pull-through" gastrostomy. Electronically Signed   By: Jerilynn Mages.  Shick M.D.   On: 07/27/2020 12:33   IR IMAGING GUIDED PORT INSERTION  Result Date: 07/27/2020 CLINICAL DATA:  METASTATIC HEAD NECK CANCER, ACCESS FOR CHEMOTHERAPY EXAM: RIGHT INTERNAL SINGLE LUMEN POWER PORT CATHETER INSERTION Date:  07/27/2020 07/27/2020 10:42 am Radiologist:  M. Daryll Brod, MD Guidance:  ULTRASOUND AND FLUOROSCOPIC MEDICATIONS: ANCEF 2 G; The antibiotic was administered within an appropriate time interval prior to skin puncture. ANESTHESIA/SEDATION: Versed 4.0 mg IV; Fentanyl 100 mcg IV; Moderate Sedation Time:  20 MINUTES The patient was continuously monitored during the procedure by the interventional radiology nurse under my direct supervision. FLUOROSCOPY TIME:  ONE minutes, 12 seconds (8 mGy) COMPLICATIONS: None immediate. CONTRAST:  NONE. PROCEDURE: Informed consent was obtained from the patient following explanation of the procedure, risks, benefits and alternatives. The patient understands, agrees and consents for the procedure. All questions were addressed. A time out was performed. Maximal barrier sterile technique utilized including caps, mask, sterile gowns, sterile gloves, large sterile drape, hand hygiene, and 2% chlorhexidine scrub. Under sterile conditions and local anesthesia, RIGHT INTERNAL JUGULAR micropuncture venous access was performed. Access was performed with ultrasound. Images were obtained for documentation of the patent right internal jugular vein. A guide wire was inserted followed by a transitional dilator. This allowed insertion of a guide wire and catheter into the IVC. Measurements were obtained from the  SVC / RA junction back to the right IJ venotomy site. In the right infraclavicular chest, a subcutaneous pocket was created over the second anterior rib. This was done under sterile conditions and local anesthesia. 1% lidocaine with epinephrine was utilized for this. A 2.5 cm incision was  made in the skin. Blunt dissection was performed to create a subcutaneous pocket over the right pectoralis major muscle. The pocket was flushed with saline vigorously. There was adequate hemostasis. The port catheter was assembled and checked for leakage. The port catheter was secured in the pocket with two retention sutures. The tubing was tunneled subcutaneously to the right venotomy site and inserted into the SVC/RA junction through a valved peel-away sheath. Position was confirmed with fluoroscopy. Images were obtained for documentation. The patient tolerated the procedure well. No immediate complications. Incisions were closed in a two layer fashion with 4 - 0 Vicryl suture. Dermabond was applied to the skin. The port catheter was accessed, blood was aspirated followed by saline and heparin flushes. Needle was removed. A dry sterile dressing was applied. IMPRESSION: Ultrasound and fluoroscopically guided right internal jugular single lumen power port catheter insertion. Tip in the SVC/RA junction. Catheter ready for use. Electronically Signed   By: Jerilynn Mages.  Shick M.D.   On: 07/27/2020 12:32    I have reviewed all pertinent images and pathology reports.  Pathology  PATHOLOGY SURGICAL PATHOLOGY  CASE: [id number]  PATIENT: Doreatha Martin  Surgical Pathology Report      Clinical History: laryngeal cancer (cm)      FINAL MICROSCOPIC DIAGNOSIS:   A. EPIGLOTTIS, BIOPSY:  - Focal poorly differentiated carcinoma, see comment.   COMMENT:   Most of the fragments have a dense lymphoid infiltrate with germinal  centers. One fragment has poorly differentiated cells at one edge. The  cells are positive for  cytokeratin 5/6 and negative for CD20. Thus, the  immunophenotype is consistent with a squamous cell carcinoma. Dr.  Vic Ripper has reviewed the case.    All questions were answered. The patient knows to call the clinic with any problems, questions or concerns. I spent  20 minutes in the care of this patient including H, review of medical records, counseling and coordination of care.  I connected with  RAPHAEL COPPIN on 08/11/20 by a video enabled telemedicine application and verified that [name] speaking with the correct person using two identifiers.   I discussed the limitations of evaluation and management by telemedicine. The patient expressed understanding and agreed to proceed.     Benay Pike, MD 08/11/2020 3:35 PM

## 2020-08-12 ENCOUNTER — Inpatient Hospital Stay: Payer: Medicare Other

## 2020-08-12 VITALS — BP 118/55 | HR 85 | Temp 97.0°F | Resp 18

## 2020-08-12 DIAGNOSIS — F17210 Nicotine dependence, cigarettes, uncomplicated: Secondary | ICD-10-CM | POA: Diagnosis not present

## 2020-08-12 DIAGNOSIS — C321 Malignant neoplasm of supraglottis: Secondary | ICD-10-CM | POA: Diagnosis not present

## 2020-08-12 DIAGNOSIS — Z5111 Encounter for antineoplastic chemotherapy: Secondary | ICD-10-CM | POA: Diagnosis not present

## 2020-08-12 MED ORDER — MAGNESIUM SULFATE 2 GM/50ML IV SOLN
INTRAVENOUS | Status: AC
  Filled 2020-08-12: qty 50

## 2020-08-12 MED ORDER — SODIUM CHLORIDE 0.9 % IV SOLN
10.0000 mg | Freq: Once | INTRAVENOUS | Status: AC
  Administered 2020-08-12: 10 mg via INTRAVENOUS
  Filled 2020-08-12: qty 10

## 2020-08-12 MED ORDER — SODIUM CHLORIDE 0.9 % IV SOLN: Freq: Once | INTRAVENOUS | Status: DC

## 2020-08-12 MED ORDER — HEPARIN SOD (PORK) LOCK FLUSH 100 UNIT/ML IV SOLN
500.0000 [IU] | Freq: Once | INTRAVENOUS | Status: AC | PRN
  Administered 2020-08-12: 500 [IU]
  Filled 2020-08-12: qty 5

## 2020-08-12 MED ORDER — SODIUM CHLORIDE 0.9 % IV SOLN
40.0000 mg/m2 | Freq: Once | INTRAVENOUS | Status: AC
  Administered 2020-08-12: 71 mg via INTRAVENOUS
  Filled 2020-08-12: qty 71

## 2020-08-12 MED ORDER — PALONOSETRON HCL INJECTION 0.25 MG/5ML
0.2500 mg | Freq: Once | INTRAVENOUS | Status: AC
  Administered 2020-08-12: 0.25 mg via INTRAVENOUS

## 2020-08-12 MED ORDER — SODIUM CHLORIDE 0.9 % IV SOLN
150.0000 mg | Freq: Once | INTRAVENOUS | Status: AC
  Administered 2020-08-12: 150 mg via INTRAVENOUS
  Filled 2020-08-12: qty 150

## 2020-08-12 MED ORDER — SODIUM CHLORIDE 0.9 % IV SOLN
Freq: Once | INTRAVENOUS | Status: AC
  Filled 2020-08-12: qty 250

## 2020-08-12 MED ORDER — POTASSIUM CHLORIDE IN NACL 20-0.9 MEQ/L-% IV SOLN
INTRAVENOUS | Status: DC
  Filled 2020-08-12 (×2): qty 1000

## 2020-08-12 MED ORDER — SODIUM CHLORIDE 0.9% FLUSH
10.0000 mL | INTRAVENOUS | Status: DC | PRN
  Administered 2020-08-12: 10 mL
  Filled 2020-08-12: qty 10

## 2020-08-12 MED ORDER — MAGNESIUM SULFATE 2 GM/50ML IV SOLN
2.0000 g | Freq: Once | INTRAVENOUS | Status: AC
  Administered 2020-08-12: 2 g via INTRAVENOUS

## 2020-08-12 MED ORDER — PALONOSETRON HCL INJECTION 0.25 MG/5ML
INTRAVENOUS | Status: AC
  Filled 2020-08-12: qty 5

## 2020-08-12 NOTE — Patient Instructions (Signed)
[facility] Discharge Instructions for Patients Receiving Chemotherapy  Today you received the following chemotherapy agents Cisplatin.  To help prevent nausea and vomiting after your treatment, we encourage you to take your nausea medication as directed  BUT NO ZOFRAN FOR 3 DAYS AFTER CHEMO, TAKE COMPAZINE INSTEAD IF YOU NEED SOMETHING FOR NAUSEA/VOMITING.   If you develop nausea and vomiting that is not controlled by your nausea medication, call the clinic.   BELOW ARE SYMPTOMS THAT SHOULD BE REPORTED IMMEDIATELY:  *FEVER GREATER THAN 100.5 F  *CHILLS WITH OR WITHOUT FEVER  NAUSEA AND VOMITING THAT IS NOT CONTROLLED WITH YOUR NAUSEA MEDICATION  *UNUSUAL SHORTNESS OF BREATH  *UNUSUAL BRUISING OR BLEEDING  TENDERNESS IN MOUTH AND THROAT WITH OR WITHOUT PRESENCE OF ULCERS  *URINARY PROBLEMS  *BOWEL PROBLEMS  UNUSUAL RASH Items with * indicate a potential emergency and should be followed up as soon as possible.  Feel free to call the clinic you have any questions or concerns. The clinic phone number is [phone].  Please show the [facility] at check-in to the Emergency Department and triage nurse.

## 2020-08-13 ENCOUNTER — Encounter: Payer: Self-pay | Admitting: Hematology and Oncology

## 2020-08-14 ENCOUNTER — Ambulatory Visit
Admission: RE | Admit: 2020-08-14 | Discharge: 2020-08-14 | Disposition: A | Discharge status: Home / Self Care | Payer: Medicare Other | Source: Ambulatory Visit | Attending: Radiation Oncology | Admitting: Radiation Oncology

## 2020-08-14 ENCOUNTER — Other Ambulatory Visit: Payer: Self-pay

## 2020-08-14 DIAGNOSIS — C321 Malignant neoplasm of supraglottis: Secondary | ICD-10-CM | POA: Diagnosis not present

## 2020-08-14 DIAGNOSIS — C329 Malignant neoplasm of larynx, unspecified: Secondary | ICD-10-CM | POA: Diagnosis not present

## 2020-08-14 NOTE — Telephone Encounter (Signed)
Contacted pt to go over Dr. Johnson message pt is aware and doesn't have any questions or concerns  

## 2020-08-15 ENCOUNTER — Telehealth: Payer: Self-pay | Admitting: Hematology and Oncology

## 2020-08-15 ENCOUNTER — Other Ambulatory Visit: Payer: Self-pay

## 2020-08-15 ENCOUNTER — Ambulatory Visit
Admission: RE | Admit: 2020-08-15 | Discharge: 2020-08-15 | Disposition: A | Discharge status: Home / Self Care | Payer: Medicare Other | Source: Ambulatory Visit | Attending: Radiation Oncology | Admitting: Radiation Oncology

## 2020-08-15 DIAGNOSIS — C329 Malignant neoplasm of larynx, unspecified: Secondary | ICD-10-CM | POA: Diagnosis not present

## 2020-08-15 DIAGNOSIS — C321 Malignant neoplasm of supraglottis: Secondary | ICD-10-CM | POA: Diagnosis not present

## 2020-08-15 NOTE — Telephone Encounter (Signed)
Scheduled appointments per 1/11 sch msg. Called patient, no answer. Left message with appointments dates and times.

## 2020-08-16 ENCOUNTER — Other Ambulatory Visit: Payer: Self-pay

## 2020-08-16 ENCOUNTER — Ambulatory Visit
Admission: RE | Admit: 2020-08-16 | Discharge: 2020-08-16 | Disposition: A | Discharge status: Home / Self Care | Payer: Medicare Other | Source: Ambulatory Visit | Attending: Radiation Oncology | Admitting: Radiation Oncology

## 2020-08-16 DIAGNOSIS — C329 Malignant neoplasm of larynx, unspecified: Secondary | ICD-10-CM | POA: Diagnosis not present

## 2020-08-16 DIAGNOSIS — C321 Malignant neoplasm of supraglottis: Secondary | ICD-10-CM | POA: Diagnosis not present

## 2020-08-17 ENCOUNTER — Inpatient Hospital Stay: Payer: Medicare Other | Admitting: Nutrition

## 2020-08-17 ENCOUNTER — Ambulatory Visit
Admission: RE | Admit: 2020-08-17 | Discharge: 2020-08-17 | Disposition: A | Discharge status: Home / Self Care | Payer: Medicare Other | Source: Ambulatory Visit | Attending: Radiation Oncology | Admitting: Radiation Oncology

## 2020-08-17 DIAGNOSIS — C329 Malignant neoplasm of larynx, unspecified: Secondary | ICD-10-CM | POA: Diagnosis not present

## 2020-08-17 DIAGNOSIS — C321 Malignant neoplasm of supraglottis: Secondary | ICD-10-CM | POA: Diagnosis not present

## 2020-08-17 NOTE — Progress Notes (Signed)
Nutrition follow-up completed with patient receiving chemoradiation therapy for laryngeal cancer.   Patient is status post PEG placement on December 23. Weight is currently documented as 149.4 pounds January 10. Patient reports increased swelling of her lower extremities.  She is trying to avoid high sodium foods. Dietary recall reveals patient is drinking 1 oral nutrition supplement daily.  Reports she continues to eat well and tolerates foods like oatmeal and pizza. She denies nausea and vomiting. She had a couple episodes of diarrhea but it has resolved. She has successfully infused Osmolite 1.5 without difficulty.  She does not use tube feeding on a regular basis because she is eating. She declines additional nutrition samples today.  Nutrition diagnosis: Predicted suboptimal energy intake continues.  Intervention: Continue to flush feeding tube daily with 60 mL free water daily. Continue adequate calories and protein by mouth as tolerated.  Increase tube feeding if oral intake decreases. Questions were answered.  Monitoring, evaluation, goals: Patient will tolerate adequate calories and protein to minimize weight loss throughout treatment.  Next visit: Tuesday, January 18 with Joli.  **Disclaimer: This note was dictated with voice recognition software. Similar sounding words can inadvertently be transcribed and this note may contain transcription errors which may not have been corrected upon publication of note.**

## 2020-08-18 ENCOUNTER — Inpatient Hospital Stay: Payer: Medicare Other

## 2020-08-18 ENCOUNTER — Inpatient Hospital Stay (HOSPITAL_BASED_OUTPATIENT_CLINIC_OR_DEPARTMENT_OTHER): Payer: Medicare Other | Admitting: Hematology and Oncology

## 2020-08-18 ENCOUNTER — Encounter: Payer: Self-pay | Admitting: Hematology and Oncology

## 2020-08-18 ENCOUNTER — Ambulatory Visit
Admission: RE | Admit: 2020-08-18 | Discharge: 2020-08-18 | Disposition: A | Discharge status: Home / Self Care | Payer: Medicare Other | Source: Ambulatory Visit | Attending: Radiation Oncology | Admitting: Radiation Oncology

## 2020-08-18 ENCOUNTER — Other Ambulatory Visit: Payer: Self-pay

## 2020-08-18 VITALS — BP 118/59 | HR 90 | Temp 98.7°F | Resp 18 | Ht 66.0 in | Wt 140.0 lb

## 2020-08-18 DIAGNOSIS — C329 Malignant neoplasm of larynx, unspecified: Secondary | ICD-10-CM

## 2020-08-18 DIAGNOSIS — C321 Malignant neoplasm of supraglottis: Secondary | ICD-10-CM | POA: Diagnosis not present

## 2020-08-18 DIAGNOSIS — Z5111 Encounter for antineoplastic chemotherapy: Secondary | ICD-10-CM | POA: Diagnosis not present

## 2020-08-18 DIAGNOSIS — F17210 Nicotine dependence, cigarettes, uncomplicated: Secondary | ICD-10-CM | POA: Diagnosis not present

## 2020-08-18 LAB — BASIC METABOLIC PANEL
Anion gap: 7 (ref 5–15)
BUN: 12 mg/dL (ref 8–23)
CO2: 31 mmol/L (ref 22–32)
Calcium: 9.2 mg/dL (ref 8.9–10.3)
Chloride: 103 mmol/L (ref 98–111)
Creatinine, Ser: 0.7 mg/dL (ref 0.44–1.00)
GFR, Estimated: >60 mL/min (ref >60)
Glucose, Bld: 89 mg/dL (ref 70–99)
Potassium: 4.1 mmol/L (ref 3.5–5.1)
Sodium: 141 mmol/L (ref 135–145)

## 2020-08-18 LAB — MAGNESIUM: Magnesium: 1.4 mg/dL — ABNORMAL LOW (ref 1.7–2.4)

## 2020-08-18 LAB — CBC WITH DIFFERENTIAL/PLATELET
Abs Immature Granulocytes: 0.01 10*3/uL (ref 0.00–0.07)
Basophils Absolute: 0 10*3/uL (ref 0.0–0.1)
Basophils Relative: 1 %
Eosinophils Absolute: 0 10*3/uL (ref 0.0–0.5)
Eosinophils Relative: 0 %
HCT: 37.1 % (ref 36.0–46.0)
Hemoglobin: 12.1 g/dL (ref 12.0–15.0)
Immature Granulocytes: 0 %
Lymphocytes Relative: 8 %
Lymphs Abs: 0.3 10*3/uL — ABNORMAL LOW (ref 0.7–4.0)
MCH: 30 pg (ref 26.0–34.0)
MCHC: 32.6 g/dL (ref 30.0–36.0)
MCV: 91.8 fL (ref 80.0–100.0)
Monocytes Absolute: 0.4 10*3/uL (ref 0.1–1.0)
Monocytes Relative: 12 %
Neutro Abs: 2.5 10*3/uL (ref 1.7–7.7)
Neutrophils Relative %: 79 %
Platelets: 125 10*3/uL — ABNORMAL LOW (ref 150–400)
RBC: 4.04 MIL/uL (ref 3.87–5.11)
RDW: 13.6 % (ref 11.5–15.5)
WBC: 3.2 10*3/uL — ABNORMAL LOW (ref 4.0–10.5)
nRBC: 0 % (ref 0.0–0.2)

## 2020-08-18 MED ORDER — SODIUM CHLORIDE 0.9 % IV SOLN
40.0000 mg/m2 | Freq: Once | INTRAVENOUS | Status: AC
  Administered 2020-08-18: 71 mg via INTRAVENOUS
  Filled 2020-08-18 (×2): qty 71

## 2020-08-18 MED ORDER — SODIUM CHLORIDE 0.9 % IV SOLN
Freq: Once | INTRAVENOUS | Status: AC
  Filled 2020-08-18: qty 250

## 2020-08-18 MED ORDER — POTASSIUM CHLORIDE IN NACL 20-0.9 MEQ/L-% IV SOLN
Freq: Once | INTRAVENOUS | Status: AC
  Filled 2020-08-18: qty 1000

## 2020-08-18 MED ORDER — MAGNESIUM SULFATE 2 GM/50ML IV SOLN
2.0000 g | Freq: Once | INTRAVENOUS | Status: AC
  Administered 2020-08-18: 2 g via INTRAVENOUS

## 2020-08-18 MED ORDER — PALONOSETRON HCL INJECTION 0.25 MG/5ML
0.2500 mg | Freq: Once | INTRAVENOUS | Status: AC
  Administered 2020-08-18: 0.25 mg via INTRAVENOUS

## 2020-08-18 MED ORDER — HEPARIN SOD (PORK) LOCK FLUSH 100 UNIT/ML IV SOLN
500.0000 [IU] | Freq: Once | INTRAVENOUS | Status: AC | PRN
  Administered 2020-08-18: 500 [IU]
  Filled 2020-08-18: qty 5

## 2020-08-18 MED ORDER — SODIUM CHLORIDE 0.9 % IV SOLN: Freq: Once | INTRAVENOUS | Status: DC

## 2020-08-18 MED ORDER — PALONOSETRON HCL INJECTION 0.25 MG/5ML
INTRAVENOUS | Status: AC
  Filled 2020-08-18: qty 5

## 2020-08-18 MED ORDER — SODIUM CHLORIDE 0.9 % IV SOLN
10.0000 mg | Freq: Once | INTRAVENOUS | Status: AC
  Administered 2020-08-18: 10 mg via INTRAVENOUS
  Filled 2020-08-18: qty 10

## 2020-08-18 MED ORDER — SODIUM CHLORIDE 0.9 % IV SOLN
150.0000 mg | Freq: Once | INTRAVENOUS | Status: AC
  Administered 2020-08-18: 150 mg via INTRAVENOUS
  Filled 2020-08-18: qty 150

## 2020-08-18 MED ORDER — SODIUM CHLORIDE 0.9% FLUSH
10.0000 mL | INTRAVENOUS | Status: DC | PRN
  Administered 2020-08-18: 10 mL
  Filled 2020-08-18: qty 10

## 2020-08-18 MED ORDER — MAGNESIUM SULFATE 2 GM/50ML IV SOLN
INTRAVENOUS | Status: AC
  Filled 2020-08-18: qty 50

## 2020-08-18 NOTE — Patient Instructions (Signed)
[facility] Discharge Instructions for Patients Receiving Chemotherapy  Today you received the following chemotherapy agents: Cisplatin  To help prevent nausea and vomiting after your treatment, we encourage you to take your nausea medication  as prescribed.    If you develop nausea and vomiting that is not controlled by your nausea medication, call the clinic.   BELOW ARE SYMPTOMS THAT SHOULD BE REPORTED IMMEDIATELY:  *FEVER GREATER THAN 100.5 F  *CHILLS WITH OR WITHOUT FEVER  NAUSEA AND VOMITING THAT IS NOT CONTROLLED WITH YOUR NAUSEA MEDICATION  *UNUSUAL SHORTNESS OF BREATH  *UNUSUAL BRUISING OR BLEEDING  TENDERNESS IN MOUTH AND THROAT WITH OR WITHOUT PRESENCE OF ULCERS  *URINARY PROBLEMS  *BOWEL PROBLEMS  UNUSUAL RASH Items with * indicate a potential emergency and should be followed up as soon as possible.  Feel free to call the clinic should you have any questions or concerns. The clinic phone number is [phone].  Please show the CHEMO ALERT CARD at check-in to the Emergency Department and triage nurse.   

## 2020-08-18 NOTE — Progress Notes (Signed)
[facility] NOTE  Patient Care Team: Ladell Pier, MD as PCP - General (Internal Medicine) Debara Pickett Nadean Corwin, MD as PCP - Cardiology (Cardiology)  CHIEF COMPLAINTS/PURPOSE OF CONSULTATION:   Supraglottic cancer   ASSESSMENT & PLAN:   No problem-specific Assessment & Plan notes found for this encounter.  No orders of the defined types were placed in this encounter.  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 We have discussed her in the ENT in multidisciplinary conference and there was a question if the T staging was a T3 versus a T4.  But since there is no conclusive evidence of T4 and since the patient also prefers no surgery at this time, we believe it is reasonable to proceed with concurrent chemoradiation.   She completed 3 weekly cycles of chemotherapy, so far doing well. No major toxicity from chemo. No hearing loss or nephrotoxicity so far.  2. Grade 1 nausea, dexamethasone has been helping, recommended as needed Zofran. 3. Grade 1/2 mucositis, unable to swallow food form couple days, using G tube, 4. Fatigue, grade 1 from concurrent chemoradiation.   RTC in one week for FU, labs and infusion.   Thank you for consulting [place] in the care of this patient.  Please do not hesitate to contact us with any additional questions or concerns.  HISTORY OF PRESENTING ILLNESS:   Raven Torres 65 y.o. female is here because of new diagnosis of laryngeal cancer  Chronology  Raven Torres is a 65 y.o. female who presents for  evaluation of weight loss and enlarged right neck node.  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 She has had a approximate 30 pound weight loss over the past 4 months.  She is having no airway problems or hoarseness.Raven Torres is a 65 y.o. female who presents for evaluation of weight loss and enlarged right neck node. She has had a approximate 30 pound weight loss over the past 4 months.  She has no hoarseness or airway problems. On fiberoptic laryngoscopy she has abnormality of the laryngeal surface of the epiglottis and is taken to the operating room for direct laryngoscopy and biopsy.  06/16/2020 2.0 x 3.2 x 2.5 cm supraglottic laryngeal mass involving the epiglottis and extending along the aryepiglottic folds bilaterally. Tumor extends into the pre-epiglottic fat and likely into the vallecula. Tumor extends into the upper thyroid cartilage bilaterally, abutting and possibly extending through the outer cortex (particularly on the left). This almost certainly reflects a supraglottic laryngeal squamous cell carcinoma  06/26/2020, she had laryngoscopy which showed there was a friable ulcerative tumor involving predominantly the  supraglottic area above the vocal cords as the vocal cords were clear to evaluation.  This extended up the laryngeal surface of epiglottis, more on the right side. A direct laryngoscopy was also performed  of the piriform sinuses which were clear bilaterally.  06/28/2020  1. The supraglottic mass has a maximum SUV of 12.5 and the enlarged right level II lymph  node has a maximum SUV of 16.9, both compatible with malignancy. No findings of metastatic disease to the chest, abdomen, pelvis, or regional skeleton. 2. New lingular ground-glass opacity compared to 01/20/2020 with very low-grade activity, probably  from alveolitis or low-grade atypical infection. 3. Aortic Atherosclerosis (ICD10-I70.0) and Emphysema (ICD10-J43.9). 4. Airway thickening is present, suggesting bronchitis or reactive airways disease. 5. 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 6.  Prominent stool throughout the colon favors constipation.  Her case was discussed in the tumor board, and radiology review suggested that there is no definitive evidence of T4 involvement, hence plan was to consider concurrent chemoradiation and thus medical oncology referral.  She has baseline history of COPD, not oxygen dependent. She is able to perform her all ADL's. She says memory has been an issue, she is forgetful at times. She had TB 3 yrs ago and was treated for this at [facility]. She has RA, takes plaquenil and sulfasalazine  Hep C induced cirrhosis, Hep C cured by Interferon, cirrhosis well compensated. She is very anxious, lives with her daughter,  She smoked about 1-2 PPD and is now smoking about 2 packs in a week. No alcohol, only when she was very young. She denies any pain in her neck, swallowing, speech or hearing.  Started concurrent chemoradiation on 07/25/2020 Completed 3 weekly cycles of chemotherapy She is doing really well over all Starting this week, she is unable to eat because of sore throat, hence started using the tube. Using biotin and viscous lidocaine for the pain, this has been helpful. Mild nausea, 2 episodes of vomiting. No diarrhea No urinary complaints No hearing loss Some leg swelling, left ankle, from her RA, she is not taking her RA medication, she cant swallow those pills she says, she will FU with her rheumatologist.  MEDICAL HISTORY:  Past Medical History:  Diagnosis Date  . Arthritis   . Cataract    removed both eyes   . Colitis   . COPD (chronic obstructive pulmonary disease) ([place])   . Hepatitis C   .  Hyperlipidemia   . Hypertension   . Shingles 03/27/2020  . TB (tuberculosis)    treatment     SURGICAL HISTORY: Past Surgical History:  Procedure Laterality Date  . ABDOMINAL HYSTERECTOMY    . CATARACT EXTRACTION, BILATERAL    . COLONOSCOPY    . COLONOSCOPY  04/04/2020  . DIRECT LARYNGOSCOPY N/A 06/26/2020   Procedure: DIRECT LARYNGOSCOPY AND BIOPSY;  Surgeon: Rozetta Nunnery, MD;  Location: [facility];  Service: ENT;  Laterality: N/A;  . IR GASTROSTOMY TUBE MOD SED  07/27/2020  . IR IMAGING GUIDED PORT INSERTION  07/27/2020  . KNEE SURGERY Right   . POLYPECTOMY  11/17/2008   HPP x 1     SOCIAL HISTORY: Social History   Socioeconomic History  . Marital status: Married    Spouse name: Not on file  . Number of children: Not on file  . Years of education: Not on file  . Highest education level: Not on file  Occupational History  . Not on file  Tobacco Use  . Smoking status: Current Every Day Smoker    Packs/day: 1.00    Years: 50.00    Pack years: 50.00    Types: Cigarettes    Start date: 37  . Smokeless tobacco: Never Used  . Tobacco comment: 4 cigs a day   Vaping Use  . Vaping Use: Never used  Substance and Sexual  Activity  . Alcohol use: No    Alcohol/week: 0.0 standard drinks  . Drug use: No  . Sexual activity: Not Currently  Other Topics Concern  . Not on file  Social History Narrative  . Not on file   Social Determinants of Health   Financial Resource Strain: Not on file  Food Insecurity: No Food Insecurity  . Worried About Charity fundraiser in the Last Year: Never true  . Ran Out of Food in the Last Year: Never true  Transportation Needs: No Transportation Needs  . Lack of Transportation (Medical): No  . Lack of Transportation (Non-Medical): No  Physical Activity: Not on file  Stress: Not on file  Social Connections: Not on file  Intimate Partner Violence: Not on file    FAMILY HISTORY: Family History  Problem Relation Age of Onset  .  Heart disease Mother   . Cancer Mother   . Melanoma Mother   . Esophageal cancer Father   . Colon cancer Neg Hx   . Colon polyps Neg Hx   . Rectal cancer Neg Hx   . Stomach cancer Neg Hx     ALLERGIES:  is allergic to tudorza pressair [aclidinium bromide], amitriptyline, codeine, and tramadol.  MEDICATIONS:  Current Outpatient Medications  Medication Sig Dispense Refill  . albuterol (PROVENTIL) (2.5 MG/3ML) 0.083% nebulizer solution Take 3 mLs (2.5 mg total) by nebulization every 6 (six) hours as needed for wheezing or shortness of breath. 150 mL 1  . albuterol (VENTOLIN HFA) 108 (90 Base) MCG/ACT inhaler INHALE 2 PUFFS INTO THE LUNGS EVERY 6 HOURS AS NEEDED FOR WHEEZING OR SHORTNESS OF BREATH (Patient taking differently: Inhale 2 puffs into the lungs every 6 (six) hours as needed for wheezing or shortness of breath. INHALE 2 PUFFS INTO THE LUNGS EVERY 6 HOURS AS NEEDED FOR WHEEZING OR SHORTNESS OF BREATH) 8.5 g 3  . aspirin EC 81 MG tablet Take 1 tablet (81 mg total) by mouth daily. (Patient not taking: Reported on 08/01/2020) 30 tablet 0  . atorvastatin (LIPITOR) 40 MG tablet TAKE 1 TABLET(40 MG) BY MOUTH DAILY AT 6 PM (Patient not taking: Reported on 08/01/2020) 30 tablet 2  . Baclofen 5 MG TABS Take 5 mg by mouth at bedtime.    Marland Kitchen BEVESPI AEROSPHERE 9-4.8 MCG/ACT AERO INHALE 2 PUFFS INTO THE LUNGS TWICE DAILY (Patient taking differently: Inhale 2 puffs into the lungs in the morning and at bedtime.) 10.7 g 1  . buPROPion (WELLBUTRIN) 100 MG tablet TAKE 1 TABLET(100 MG) BY MOUTH TWICE DAILY (Patient not taking: Reported on 08/01/2020) 60 tablet 2  . CALCIUM CARBONATE-VIT D-MIN PO Take 1,200 mg by mouth daily.  (Patient not taking: Reported on 08/01/2020)    . dexamethasone (DECADRON) 4 MG tablet Take 2 tablets (8 mg total) by mouth daily. Take daily x 3 days starting the day after cisplatin chemotherapy. Take with food. (Patient not taking: Reported on 08/01/2020) 30 tablet 1  . diclofenac  Sodium (VOLTAREN) 1 % GEL Place 1 application onto the skin 4 (four) times daily as needed. (Patient not taking: Reported on 08/01/2020)    . folic acid (FOLVITE) 1 MG tablet Take 1 mg by mouth daily.  (Patient not taking: Reported on 08/01/2020)  3  . gabapentin (NEURONTIN) 100 MG capsule Take 2 capsules (200 mg total) by mouth 2 (two) times daily. (Patient not taking: Reported on 08/01/2020) 56 capsule 0  . hydroxychloroquine (PLAQUENIL) 200 MG tablet Take 200 mg by mouth 2 (two)  times daily. (Patient not taking: Reported on 08/01/2020)    . lidocaine (XYLOCAINE) 2 % solution Patient: Mix 1part 2% viscous lidocaine, 1part H20. Swallow 23mL of diluted mixture, 76min before meals and at bedtime, up to QID (Patient not taking: Reported on 08/01/2020) 200 mL 4  . lidocaine-prilocaine (EMLA) cream Apply to affected area once (Patient not taking: Reported on 08/01/2020) 30 g 3  . LORazepam (ATIVAN) 0.5 MG tablet Take 1 tablet (0.5 mg total) by mouth every 6 (six) hours as needed (Nausea or vomiting). (Patient not taking: Reported on 08/01/2020) 30 tablet 0  . ondansetron (ZOFRAN) 8 MG tablet Take 1 tablet (8 mg total) by mouth 2 (two) times daily as needed. Start on the third day after cisplatin chemotherapy. (Patient not taking: Reported on 08/01/2020) 30 tablet 1  . prochlorperazine (COMPAZINE) 10 MG tablet Take 1 tablet (10 mg total) by mouth every 6 (six) hours as needed (Nausea or vomiting). (Patient not taking: Reported on 08/01/2020) 30 tablet 1  . sulfaSALAzine (AZULFIDINE) 500 MG EC tablet Take 500 mg by mouth 2 (two) times daily.  (Patient not taking: Reported on 08/01/2020)    . valACYclovir (VALTREX) 1000 MG tablet Take 1 tablet (1,000 mg total) by mouth 3 (three) times daily. (Patient not taking: Reported on 08/01/2020) 21 tablet 0   No current facility-administered medications for this visit.    PHYSICAL EXAMINATION: ECOG PERFORMANCE STATUS: 0 - Asymptomatic  There were no vitals filed  for this visit. There were no vitals filed for this visit.  Physical Exam Constitutional:      Appearance: Normal appearance.  HENT:     Head: Normocephalic and atraumatic.     Mouth/Throat:     Mouth: Mucous membranes are dry.     Pharynx: No oropharyngeal exudate or posterior oropharyngeal erythema.  Eyes:     Extraocular Movements: Extraocular movements intact.     Pupils: Pupils are equal, round, and reactive to light.  Cardiovascular:     Rate and Rhythm: Normal rate and regular rhythm.     Pulses: Normal pulses.     Heart sounds: Normal heart sounds.  Pulmonary:     Effort: Pulmonary effort is normal.     Breath sounds: Normal breath sounds.  Abdominal:     General: Abdomen is flat. Bowel sounds are normal.     Palpations: Abdomen is soft.     Comments: PEG site looks clean   Musculoskeletal:        General: Swelling (left ankle swollen, likely from RA according to patient, she has had RA before in that leg) present. Normal range of motion.     Cervical back: Normal range of motion and neck supple. Tenderness (rad changes, no ulceration) present.  Lymphadenopathy:     Cervical: No cervical adenopathy (on gentle palpation, couldnt do deep palpation because of pain).  Skin:    General: Skin is warm and dry.  Neurological:     General: No focal deficit present.     Mental Status: She is alert.  Psychiatric:        Mood and Affect: Mood normal.       LABORATORY DATA:  I have reviewed the data as listed Lab Results  Component Value Date   WBC 5.0 08/11/2020   HGB 11.8 (L) 08/11/2020   HCT 35.2 (L) 08/11/2020   MCV 91.9 08/11/2020   PLT 129 (L) 08/11/2020     Chemistry      Component Value Date/Time   NA  144 08/11/2020 1153   NA 139 06/02/2020 0845   K 3.8 08/11/2020 1153   CL 107 08/11/2020 1153   CO2 30 08/11/2020 1153   BUN 18 08/11/2020 1153   BUN 12 06/02/2020 0845   CREATININE 0.63 08/11/2020 1153   CREATININE 0.70 02/26/2016 1143      Component  Value Date/Time   CALCIUM 9.0 08/11/2020 1153   ALKPHOS 44 06/26/2020 0658   AST 30 06/26/2020 0658   ALT 15 06/26/2020 0658   BILITOT 0.7 06/26/2020 0658   BILITOT 0.5 06/02/2020 0845       RADIOGRAPHIC STUDIES: I have personally reviewed the radiological images as listed and agreed with the findings in the report. IR Gastrostomy Tube  Result Date: 07/27/2020 INDICATION: Metastatic head and neck cancer (laryngeal cancer) EXAM: FLUOROSCOPIC 20 FRENCH PULL-THROUGH GASTROSTOMY Date:  07/27/2020 07/27/2020 10:42 am Radiologist:  M. Daryll Brod, MD Guidance:  Fluoroscopic MEDICATIONS: Ancef 2 g; Antibiotics were administered within 1 hour of the procedure. Glucagon 0.5 mg IV ANESTHESIA/SEDATION: Versed 1 mg IV; Fentanyl 100 mcg IV Moderate Sedation Time:  16 The patient was continuously monitored during the procedure by the interventional radiology nurse under my direct supervision. CONTRAST:  56mL OMNIPAQUE IOHEXOL 300 MG/ML SOLN - administered into the gastric lumen. FLUOROSCOPY TIME:  Fluoroscopy Time: 3 minutes 36 seconds (37 mGy). COMPLICATIONS: None immediate. PROCEDURE: Informed consent was obtained from the patient following explanation of the procedure, risks, benefits and alternatives. The patient understands, agrees and consents for the procedure. All questions were addressed. A time out was performed. Maximal barrier sterile technique utilized including caps, mask, sterile gowns, sterile gloves, large sterile drape, hand hygiene, and betadine prep. The left upper quadrant was sterilely prepped and draped. An oral gastric catheter was inserted into the stomach under fluoroscopy. The existing nasogastric feeding tube was removed. Air was injected into the stomach for insufflation and visualization under fluoroscopy. The air distended stomach was confirmed beneath the anterior abdominal wall in the frontal and lateral projections. Under sterile conditions and local anesthesia, a 63 gauge trocar  needle was utilized to access the stomach percutaneously beneath the left subcostal margin. Needle position was confirmed within the stomach under biplane fluoroscopy. Contrast injection confirmed position also. A single T tack was deployed for gastropexy. Over an Amplatz guide wire, a 9-French sheath was inserted into the stomach. A snare device was utilized to capture the oral gastric catheter. The snare device was pulled retrograde from the stomach up the esophagus and out the oropharynx. The 20-French pull-through gastrostomy was connected to the snare device and pulled antegrade through the oropharynx down the esophagus into the stomach and then through the percutaneous tract external to the patient. The gastrostomy was assembled externally. Contrast injection confirms position in the stomach. Images were obtained for documentation. The patient tolerated procedure well. No immediate complication. IMPRESSION: Fluoroscopic insertion of a 20-French "pull-through" gastrostomy. Electronically Signed   By: Jerilynn Mages.  Shick M.D.   On: 07/27/2020 12:33   IR IMAGING GUIDED PORT INSERTION  Result Date: 07/27/2020 CLINICAL DATA:  METASTATIC HEAD NECK CANCER, ACCESS FOR CHEMOTHERAPY EXAM: RIGHT INTERNAL SINGLE LUMEN POWER PORT CATHETER INSERTION Date:  07/27/2020 07/27/2020 10:42 am Radiologist:  M. Daryll Brod, MD Guidance:  ULTRASOUND AND FLUOROSCOPIC MEDICATIONS: ANCEF 2 G; The antibiotic was administered within an appropriate time interval prior to skin puncture. ANESTHESIA/SEDATION: Versed 4.0 mg IV; Fentanyl 100 mcg IV; Moderate Sedation Time:  20 MINUTES The patient was continuously monitored during the procedure by the interventional  radiology nurse under my direct supervision. FLUOROSCOPY TIME:  ONE minutes, 12 seconds (8 mGy) COMPLICATIONS: None immediate. CONTRAST:  NONE. PROCEDURE: Informed consent was obtained from the patient following explanation of the procedure, risks, benefits and alternatives. The patient  understands, agrees and consents for the procedure. All questions were addressed. A time out was performed. Maximal barrier sterile technique utilized including caps, mask, sterile gowns, sterile gloves, large sterile drape, hand hygiene, and 2% chlorhexidine scrub. Under sterile conditions and local anesthesia, RIGHT INTERNAL JUGULAR micropuncture venous access was performed. Access was performed with ultrasound. Images were obtained for documentation of the patent right internal jugular vein. A guide wire was inserted followed by a transitional dilator. This allowed insertion of a guide wire and catheter into the IVC. Measurements were obtained from the SVC / RA junction back to the right IJ venotomy site. In the right infraclavicular chest, a subcutaneous pocket was created over the second anterior rib. This was done under sterile conditions and local anesthesia. 1% lidocaine with epinephrine was utilized for this. A 2.5 cm incision was made in the skin. Blunt dissection was performed to create a subcutaneous pocket over the right pectoralis major muscle. The pocket was flushed with saline vigorously. There was adequate hemostasis. The port catheter was assembled and checked for leakage. The port catheter was secured in the pocket with two retention sutures. The tubing was tunneled subcutaneously to the right venotomy site and inserted into the SVC/RA junction through a valved peel-away sheath. Position was confirmed with fluoroscopy. Images were obtained for documentation. The patient tolerated the procedure well. No immediate complications. Incisions were closed in a two layer fashion with 4 - 0 Vicryl suture. Dermabond was applied to the skin. The port catheter was accessed, blood was aspirated followed by saline and heparin flushes. Needle was removed. A dry sterile dressing was applied. IMPRESSION: Ultrasound and fluoroscopically guided right internal jugular single lumen power port catheter insertion. Tip  in the SVC/RA junction. Catheter ready for use. Electronically Signed   By: Jerilynn Mages.  Shick M.D.   On: 07/27/2020 12:32    I have reviewed all pertinent images and pathology reports.  Pathology  PATHOLOGY SURGICAL PATHOLOGY  CASE: [id number]  PATIENT: Doreatha Martin  Surgical Pathology Report      Clinical History: laryngeal cancer (cm)      FINAL MICROSCOPIC DIAGNOSIS:   A. EPIGLOTTIS, BIOPSY:  - Focal poorly differentiated carcinoma, see comment.   COMMENT:   Most of the fragments have a dense lymphoid infiltrate with germinal  centers. One fragment has poorly differentiated cells at one edge. The  cells are positive for cytokeratin 5/6 and negative for CD20. Thus, the  immunophenotype is consistent with a squamous cell carcinoma. Dr.  Vic Ripper has reviewed the case.    Labs were reviewed, satisfactory to proceed with chemotherapy  All questions were answered. The patient knows to call the clinic with any problems, questions or concerns. I spent  30 minutes in the care of this patient including H, review of medical records, documentation, counseling and coordination of care.     Benay Pike, MD 08/18/2020 8:43 AM

## 2020-08-20 ENCOUNTER — Ambulatory Visit: Payer: Medicare Other

## 2020-08-21 ENCOUNTER — Ambulatory Visit: Payer: Medicare Other

## 2020-08-21 ENCOUNTER — Telehealth: Payer: Self-pay | Admitting: Hematology and Oncology

## 2020-08-21 NOTE — Telephone Encounter (Signed)
Rescheduled lab/MD visit per MD updated schedule. Spoke to patient who is aware that lab/MD appointments will be on Thursdays and tx's will be on Friday's. Will have updated calendar printed for patient at next visit as well.

## 2020-08-22 ENCOUNTER — Ambulatory Visit: Payer: Medicare Other

## 2020-08-22 ENCOUNTER — Inpatient Hospital Stay: Payer: Medicare Other

## 2020-08-22 NOTE — Progress Notes (Signed)
Nutrition Follow-up:  Patient with laryngeal cancer and receiving chemotherapy and radiation therapy.  PEG placement on 12/23  Spoke with patient via phone today for nutrition follow-up.  Patient reports that she is not eating very much.  "[name] trying to eat."  Reports food does not have much taste and throat is sore.  Drinks boost 2 times per day.  Reports eating little bites of mashed potatoes, soups.  Says that she has not been using tube feeding.  "Fluid came out of my tube."  "I don't think my tube is draining."  I coughed and fluid came out.  "[name] going to get somebody to look at it when I come in.  I don't think [name] going to be able to come to radiation today due to slick roads."    Medications: reviewed  Labs: reviewed  Anthropometrics:   Weight 140 lb on 1/14 decreased from 149 lb   NUTRITION DIAGNOSIS: Predicted suboptimal energy intake continues   INTERVENTION:  Discussed with patient steps to relieving clogged tube if unable to flush the tube.  Encouraged instilling warm water if able and let sit for 10-15 minutes.  Using syringe with plunger gently push and pull multiple time until clog is removed.  May need to repeat steps as necessary until clog is removed.  Will inform Nurse Navigator. Patient will talk to nursing when back on campus for radiation.  Encouraged patient to start using tube for nutrition as oral intake decreasing once tube has been evaluated.      MONITORING, EVALUATION, GOAL: weight trends, intake, tube feeding   NEXT VISIT: Jan 25 (Tuesday)  Raven Torres B. Raven Torres, [place], [place] Registered Dietitian [phone] (mobile)

## 2020-08-23 ENCOUNTER — Ambulatory Visit
Admission: RE | Admit: 2020-08-23 | Discharge: 2020-08-23 | Disposition: A | Discharge status: Home / Self Care | Payer: Medicare Other | Source: Ambulatory Visit | Attending: Radiation Oncology | Admitting: Radiation Oncology

## 2020-08-23 ENCOUNTER — Inpatient Hospital Stay: Payer: Medicare Other

## 2020-08-23 ENCOUNTER — Ambulatory Visit: Payer: Medicare Other

## 2020-08-23 ENCOUNTER — Other Ambulatory Visit: Payer: Self-pay

## 2020-08-23 VITALS — BP 127/68 | HR 84 | Temp 97.8°F | Resp 18 | Wt 135.1 lb

## 2020-08-23 DIAGNOSIS — Z95828 Presence of other vascular implants and grafts: Secondary | ICD-10-CM

## 2020-08-23 DIAGNOSIS — F17210 Nicotine dependence, cigarettes, uncomplicated: Secondary | ICD-10-CM | POA: Diagnosis not present

## 2020-08-23 DIAGNOSIS — C329 Malignant neoplasm of larynx, unspecified: Secondary | ICD-10-CM | POA: Diagnosis not present

## 2020-08-23 DIAGNOSIS — C321 Malignant neoplasm of supraglottis: Secondary | ICD-10-CM

## 2020-08-23 DIAGNOSIS — Z5111 Encounter for antineoplastic chemotherapy: Secondary | ICD-10-CM | POA: Diagnosis not present

## 2020-08-23 DIAGNOSIS — J449 Chronic obstructive pulmonary disease, unspecified: Secondary | ICD-10-CM | POA: Diagnosis not present

## 2020-08-23 LAB — BASIC METABOLIC PANEL
Anion gap: 11 (ref 5–15)
BUN: 18 mg/dL (ref 8–23)
CO2: 25 mmol/L (ref 22–32)
Calcium: 8.4 mg/dL — ABNORMAL LOW (ref 8.9–10.3)
Chloride: 102 mmol/L (ref 98–111)
Creatinine, Ser: 0.72 mg/dL (ref 0.44–1.00)
GFR, Estimated: >60 mL/min (ref >60)
Glucose, Bld: 92 mg/dL (ref 70–99)
Potassium: 3.7 mmol/L (ref 3.5–5.1)
Sodium: 138 mmol/L (ref 135–145)

## 2020-08-23 LAB — CBC WITH DIFFERENTIAL/PLATELET
Abs Immature Granulocytes: 0.01 10*3/uL (ref 0.00–0.07)
Basophils Absolute: 0 10*3/uL (ref 0.0–0.1)
Basophils Relative: 0 %
Eosinophils Absolute: 0 10*3/uL (ref 0.0–0.5)
Eosinophils Relative: 0 %
HCT: 39.4 % (ref 36.0–46.0)
Hemoglobin: 13.4 g/dL (ref 12.0–15.0)
Immature Granulocytes: 0 %
Lymphocytes Relative: 13 %
Lymphs Abs: 0.4 10*3/uL — ABNORMAL LOW (ref 0.7–4.0)
MCH: 30.5 pg (ref 26.0–34.0)
MCHC: 34 g/dL (ref 30.0–36.0)
MCV: 89.7 fL (ref 80.0–100.0)
Monocytes Absolute: 0.3 10*3/uL (ref 0.1–1.0)
Monocytes Relative: 13 %
Neutro Abs: 2 10*3/uL (ref 1.7–7.7)
Neutrophils Relative %: 74 %
Platelets: 103 10*3/uL — ABNORMAL LOW (ref 150–400)
RBC: 4.39 MIL/uL (ref 3.87–5.11)
RDW: 14.6 % (ref 11.5–15.5)
WBC: 2.7 10*3/uL — ABNORMAL LOW (ref 4.0–10.5)
nRBC: 0 % (ref 0.0–0.2)

## 2020-08-23 LAB — MAGNESIUM: Magnesium: 1.4 mg/dL — ABNORMAL LOW (ref 1.7–2.4)

## 2020-08-23 MED ORDER — OSMOLITE 1.5 CAL PO LIQD: ORAL | 0 refills | Status: DC

## 2020-08-23 MED ORDER — HEPARIN SOD (PORK) LOCK FLUSH 100 UNIT/ML IV SOLN
500.0000 [IU] | Freq: Once | INTRAVENOUS | Status: AC
  Administered 2020-08-23: 500 [IU] via INTRAVENOUS
  Filled 2020-08-23: qty 5

## 2020-08-23 MED ORDER — SODIUM CHLORIDE 0.9 % IV SOLN
INTRAVENOUS | Status: AC
  Filled 2020-08-23 (×2): qty 250

## 2020-08-23 MED ORDER — SODIUM CHLORIDE 0.9% FLUSH
10.0000 mL | Freq: Once | INTRAVENOUS | Status: AC
  Administered 2020-08-23: 10 mL via INTRAVENOUS
  Filled 2020-08-23: qty 10

## 2020-08-23 NOTE — Progress Notes (Signed)
Nutrition Follow-up:  Patient with laryngeal cancer.  PEG placement on 07/27/20.    Acute add on today to see in infusion.  Met with patient during infusion and receiving IV fluids.  Patient not eating or drinking enough at this time orally.  Patient says she has only used 1 carton of osmolite 1.5 out of case.  RD has been asking patient to use at least 1 carton of tube feeding daily since 12/28.      Medications: reviewed  Labs: Mag 1.4, K 3.7  Anthropometrics:   Weight 135 lb 2 oz today 1/19 decreased from 149 lb on 1/10   Estimated Energy Needs  Kcals: 7915-0569 Protein: 100-117  g Fluid: > 2 L  NUTRITION DIAGNOSIS: Predicted suboptimal energy intake continues   INTERVENTION:  Recommend osmolite 1.5 at goal rate of 6 cartons per day to better meet nutritional needs.  (1 1/2 cartons QID). Flush with 83m of water before and after each feeding.  Drink or give additional 3 cups (2434m via feeding tube between feedings for better hydration. Recommend starting with 1/2 carton at each feeding today and increase by 1/2 carton daily until at goal rate.  Handwritten instructions on tube feeding titration given to patient today and review instructions given.  Patient demonstrated to RD on 12/28 giving 6027mlush before and after and gave 1 carton feeding.   Tube feeding will provide 2130 calories, 89 g protein 2280m36mee water. Meets 100% of nutritional needs. Patient can eat orally as able. Recommend refeeding labs as patient at risk of refeeding syndrome with weight loss. Message sent to provider RD to contact [facility] to provide enteral supplies.   Patient has contact information    MONITORING, EVALUATION, GOAL: weight trends, intake, tube feeding   NEXT VISIT: Jan 25 after raidation   Raven Fei B. Raven Torres, [place] Dietitian [phone])

## 2020-08-23 NOTE — Patient Instructions (Signed)
Rehydration, Adult Rehydration is the replacement of body fluids, salts, and minerals (electrolytes) that are lost during dehydration. Dehydration is when there is not enough water or other fluids in the body. This happens when you lose more fluids than you take in. Common causes of dehydration include:  Not drinking enough fluids. This can occur when you are ill or doing activities that require a lot of energy, especially in hot weather.  Conditions that cause loss of water or other fluids, such as diarrhea, vomiting, sweating, or urinating a lot.  Other illnesses, such as fever or infection.  Certain medicines, such as those that remove excess fluid from the body (diuretics). Symptoms of mild or moderate dehydration may include thirst, dry lips and mouth, and dizziness. Symptoms of severe dehydration may include increased heart rate, confusion, fainting, and not urinating. For severe dehydration, you may need to get fluids through an IV at the hospital. For mild or moderate dehydration, you can usually rehydrate at home by drinking certain fluids as told by your health care provider. What are the risks? Generally, rehydration is safe. However, taking in too much fluid (overhydration) can be a problem. This is rare. Overhydration can cause an electrolyte imbalance, kidney failure, or a decrease in salt (sodium) levels in the body. Supplies needed You will need an oral rehydration solution (ORS) if your health care provider tells you to use one. This is a drink to treat dehydration. It can be found in pharmacies and retail stores. How to rehydrate Fluids Follow instructions from your health care provider for rehydration. The kind of fluid and the amount you should drink depend on your condition. In general, you should choose drinks that you prefer.  If told by your health care provider, drink an ORS. ? Make an ORS by following instructions on the package. ? Start by drinking small amounts,  about  cup (120 mL) every 5-10 minutes. ? Slowly increase how much you drink until you have taken the amount recommended by your health care provider.  Drink enough clear fluids to keep your urine pale yellow. If you were told to drink an ORS, finish it first, then start slowly drinking other clear fluids. Drink fluids such as: ? Water. This includes sparkling water and flavored water. Drinking only water can lead to having too little sodium in your body (hyponatremia). Follow the advice of your health care provider. ? Water from ice chips you suck on. ? Fruit juice with water you add to it (diluted). ? Sports drinks. ? Hot or cold herbal teas. ? Broth-based soups. ? Milk or milk products. Food Follow instructions from your health care provider about what to eat while you rehydrate. Your health care provider may recommend that you slowly begin eating regular foods in small amounts.  Eat foods that contain a healthy balance of electrolytes, such as bananas, oranges, potatoes, tomatoes, and spinach.  Avoid foods that are greasy or contain a lot of sugar. In some cases, you may get nutrition through a feeding tube that is passed through your nose and into your stomach (nasogastric tube, or NG tube). This may be done if you have uncontrolled vomiting or diarrhea.   Beverages to avoid Certain beverages may make dehydration worse. While you rehydrate, avoid drinking alcohol.   How to tell if you are recovering from dehydration You may be recovering from dehydration if:  You are urinating more often than before you started rehydrating.  Your urine is pale yellow.  Your energy level   improves.  You vomit less frequently.  You have diarrhea less frequently.  Your appetite improves or returns to normal.  You feel less dizzy or less light-headed.  Your skin tone and color start to look more normal. Follow these instructions at home:  Take over-the-counter and prescription medicines only  as told by your health care provider.  Do not take sodium tablets. Doing this can lead to having too much sodium in your body (hypernatremia). Contact a health care provider if:  You continue to have symptoms of mild or moderate dehydration, such as: ? Thirst. ? Dry lips. ? Slightly dry mouth. ? Dizziness. ? Dark urine or less urine than normal. ? Muscle cramps.  You continue to vomit or have diarrhea. Get help right away if you:  Have symptoms of dehydration that get worse.  Have a fever.  Have a severe headache.  Have been vomiting and the following happens: ? Your vomiting gets worse or does not go away. ? Your vomit includes blood or green matter (bile). ? You cannot eat or drink without vomiting.  Have problems with urination or bowel movements, such as: ? Diarrhea that gets worse or does not go away. ? Blood in your stool (feces). This may cause stool to look black and tarry. ? Not urinating, or urinating only a small amount of very dark urine, within 6-8 hours.  Have trouble breathing.  Have symptoms that get worse with treatment. These symptoms may represent a serious problem that is an emergency. Do not wait to see if the symptoms will go away. Get medical help right away. Call your local emergency services (911 in the [place] Do not drive yourself to the hospital. Summary  Rehydration is the replacement of body fluids and minerals (electrolytes) that are lost during dehydration.  Follow instructions from your health care provider for rehydration. The kind of fluid and amount you should drink depend on your condition.  Slowly increase how much you drink until you have taken the amount recommended by your health care provider.  Contact your health care provider if you continue to show signs of mild or moderate dehydration. This information is not intended to replace advice given to you by your health care provider. Make sure you discuss any questions you have with  your health care provider. Document Revised: 09/22/2019 Document Reviewed: 08/02/2019 [employer]vier Patient Education  2021 [employer]  

## 2020-08-23 NOTE — Addendum Note (Signed)
Addended by: Jennet Maduro B on: 08/23/2020 02:30 PM   Modules accepted: Orders

## 2020-08-24 ENCOUNTER — Other Ambulatory Visit: Payer: Medicare Other

## 2020-08-24 ENCOUNTER — Encounter: Payer: Self-pay | Admitting: Hematology and Oncology

## 2020-08-24 ENCOUNTER — Ambulatory Visit
Admission: RE | Admit: 2020-08-24 | Discharge: 2020-08-24 | Disposition: A | Discharge status: Home / Self Care | Payer: Medicare Other | Source: Ambulatory Visit | Attending: Radiation Oncology | Admitting: Radiation Oncology

## 2020-08-24 ENCOUNTER — Inpatient Hospital Stay (HOSPITAL_BASED_OUTPATIENT_CLINIC_OR_DEPARTMENT_OTHER): Payer: Medicare Other | Admitting: Hematology and Oncology

## 2020-08-24 ENCOUNTER — Other Ambulatory Visit: Payer: Self-pay

## 2020-08-24 ENCOUNTER — Other Ambulatory Visit: Payer: Self-pay | Admitting: Hematology and Oncology

## 2020-08-24 VITALS — BP 110/63 | HR 91 | Temp 97.0°F | Resp 14 | Ht 66.0 in | Wt 137.5 lb

## 2020-08-24 DIAGNOSIS — C321 Malignant neoplasm of supraglottis: Secondary | ICD-10-CM

## 2020-08-24 DIAGNOSIS — C329 Malignant neoplasm of larynx, unspecified: Secondary | ICD-10-CM | POA: Diagnosis not present

## 2020-08-24 DIAGNOSIS — F17210 Nicotine dependence, cigarettes, uncomplicated: Secondary | ICD-10-CM | POA: Diagnosis not present

## 2020-08-24 DIAGNOSIS — Z5111 Encounter for antineoplastic chemotherapy: Secondary | ICD-10-CM | POA: Diagnosis not present

## 2020-08-24 NOTE — Progress Notes (Signed)
Raven Torres NOTE  Patient Care Team: Ladell Pier, MD as PCP - General (Internal Medicine) Debara Pickett Nadean Corwin, MD as PCP - Cardiology (Cardiology)  CHIEF COMPLAINTS/PURPOSE OF CONSULTATION:   Supraglottic cancer   ASSESSMENT & PLAN:   No problem-specific Assessment & Plan notes found for this encounter.  Orders Placed This Encounter  Procedures  . Comprehensive metabolic panel    Standing Status:   Standing    Number of Occurrences:   3    Standing Expiration Date:   08/24/2021  . Magnesium    Standing Status:   Standing    Number of Occurrences:   4    Standing Expiration Date:   08/24/2021  . 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 We have discussed her in the ENT multidisciplinary conference and there was a question if the T staging was a T3 versus a T4.  But since there is no conclusive evidence of T4 and since the patient also prefers no surgery at this time, we believe it is reasonable to proceed with concurrent chemoradiation.  She completed 4 weekly cycles of chemotherapy, so far doing well. No major toxicity from chemo. No hearing loss or nephrotoxicity so far.  2. Grade 1 nausea, dexamethasone has been helping, recommended as needed Zofran. 3. Grade 1/2 mucositis, unable to swallow food form couple days, using G tube, encouraged to use this. 4. Fatigue, grade 1 from concurrent chemoradiation.  Labs from  yesterday reviewed, ok to proceed with chemotherapy, will benefit from Magnesium replacement as well.  RTC in one week for FU, labs and infusion.   Thank you for consulting [place] in the care of this patient.  Please do not hesitate to contact us with any additional questions or concerns.  HISTORY OF PRESENTING ILLNESS:   Raven FAILLA 65 y.o. female is here because of new diagnosis of laryngeal cancer  Chronology  Raven Torres is a 65 y.o. female who presents for evaluation of weight loss and enlarged right neck node.  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 She has had a approximate 30 pound weight loss over the past 4 months.  She is having no airway problems or hoarseness.Raven Torres is a 65 y.o. female who presents for evaluation of weight loss and enlarged right neck node. She has had a approximate 30 pound weight loss over the past 4 months.  She has no hoarseness or airway problems. On fiberoptic laryngoscopy she has abnormality of the laryngeal surface of the epiglottis and is taken to the operating room for direct laryngoscopy and biopsy.  06/16/2020 2.0 x 3.2 x 2.5 cm supraglottic laryngeal mass involving the epiglottis and extending along the aryepiglottic folds bilaterally. Tumor extends into the pre-epiglottic fat and likely into the vallecula. Tumor extends into the upper thyroid cartilage  bilaterally, abutting and possibly extending through the outer cortex (particularly on the left). This almost certainly reflects a supraglottic laryngeal squamous cell carcinoma  06/26/2020, she had laryngoscopy which showed there was a friable ulcerative tumor involving predominantly the  supraglottic area above the vocal cords as the vocal cords were clear to evaluation.  This extended up the laryngeal surface of epiglottis,  more on the right side. A direct laryngoscopy was also performed  of the piriform sinuses which were clear bilaterally.  06/28/2020  1. The supraglottic mass has a maximum SUV of 12.5 and the enlarged right level II lymph node has a maximum SUV of 16.9, both compatible with malignancy. No findings of metastatic disease to the chest, abdomen, pelvis, or regional skeleton. 2. New lingular ground-glass opacity compared to 01/20/2020 with very low-grade activity, probably from alveolitis or low-grade atypical infection. 3. Aortic Atherosclerosis (ICD10-I70.0) and Emphysema (ICD10-J43.9). 4. Airway thickening is present, suggesting bronchitis or reactive airways disease. 5. 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 6.  Prominent stool throughout the colon favors constipation.  Her case was discussed in the tumor board, and radiology review suggested that there is no definitive evidence of T4 involvement, hence plan was to consider concurrent chemoradiation and thus medical oncology referral.  She has baseline history of COPD, not oxygen dependent. She is able to perform her all ADL's. She says memory has been an issue, she is forgetful at times. She had TB 3 yrs ago and was treated for this at [facility]. She has RA, takes plaquenil and sulfasalazine  Hep C induced cirrhosis, Hep C cured by Interferon, cirrhosis well compensated. She is very anxious, lives with her daughter,  She smoked about 1-2 PPD and is now smoking about 2 packs in a week. No alcohol, only when she was very young. She denies any pain in her neck, swallowing, speech or hearing.  Started concurrent chemoradiation on 07/25/2020 Completed 4 weekly cycles of chemotherapy She is doing really well over all Starting this week, she is unable to eat because of sore throat, hence started using the tube. Using biotin and viscous lidocaine for the pain, this  has been helpful. She wasn't insisting on more pain medication at this time. Mild nausea,  No diarrhea No urinary complaints No hearing loss Some leg swelling, left ankle, from her RA, Rest of the pertinent 10 point ROS reviewed and negative  MEDICAL HISTORY:  Past Medical History:  Diagnosis Date  . Arthritis   . Cataract    removed both eyes   . Colitis   . COPD (chronic obstructive pulmonary disease) ([place])   . Hepatitis C   . Hyperlipidemia   . Hypertension   . Shingles 03/27/2020  . TB (tuberculosis)    treatment     SURGICAL HISTORY: Past Surgical History:  Procedure Laterality Date  . ABDOMINAL HYSTERECTOMY    . CATARACT EXTRACTION, BILATERAL    . COLONOSCOPY    . COLONOSCOPY  04/04/2020  . DIRECT LARYNGOSCOPY N/A 06/26/2020   Procedure: DIRECT LARYNGOSCOPY AND BIOPSY;  Surgeon: Rozetta Nunnery, MD;  Location: [place];  Service: ENT;  Laterality: N/A;  . IR GASTROSTOMY TUBE MOD SED  07/27/2020  . IR IMAGING GUIDED PORT INSERTION  07/27/2020  . KNEE SURGERY Right   . POLYPECTOMY  11/17/2008   HPP x 1     SOCIAL HISTORY: Social History   Socioeconomic History  . Marital status: Married    Spouse name: Not  on file  . Number of children: Not on file  . Years of education: Not on file  . Highest education level: Not on file  Occupational History  . Not on file  Tobacco Use  . Smoking status: Current Every Day Smoker    Packs/day: 1.00    Years: 50.00    Pack years: 50.00    Types: Cigarettes    Start date: 45  . Smokeless tobacco: Never Used  . Tobacco comment: 4 cigs a day   Vaping Use  . Vaping Use: Never used  Substance and Sexual Activity  . Alcohol use: No    Alcohol/week: 0.0 standard drinks  . Drug use: No  . Sexual activity: Not Currently  Other Topics Concern  . Not on file  Social History Narrative  . Not on file   Social Determinants of Health   Financial Resource Strain: Not on file  Food Insecurity: No Food Insecurity  .  Worried About Charity fundraiser in the Last Year: Never true  . Ran Out of Food in the Last Year: Never true  Transportation Needs: No Transportation Needs  . Lack of Transportation (Medical): No  . Lack of Transportation (Non-Medical): No  Physical Activity: Not on file  Stress: Not on file  Social Connections: Not on file  Intimate Partner Violence: Not on file    FAMILY HISTORY: Family History  Problem Relation Age of Onset  . Heart disease Mother   . Cancer Mother   . Melanoma Mother   . Esophageal cancer Father   . Colon cancer Neg Hx   . Colon polyps Neg Hx   . Rectal cancer Neg Hx   . Stomach cancer Neg Hx     ALLERGIES:  is allergic to tudorza pressair [aclidinium bromide], amitriptyline, codeine, and tramadol.  MEDICATIONS:  Current Outpatient Medications  Medication Sig Dispense Refill  . albuterol (PROVENTIL) (2.5 MG/3ML) 0.083% nebulizer solution Take 3 mLs (2.5 mg total) by nebulization every 6 (six) hours as needed for wheezing or shortness of breath. 150 mL 1  . albuterol (VENTOLIN HFA) 108 (90 Base) MCG/ACT inhaler INHALE 2 PUFFS INTO THE LUNGS EVERY 6 HOURS AS NEEDED FOR WHEEZING OR SHORTNESS OF BREATH (Patient taking differently: Inhale 2 puffs into the lungs every 6 (six) hours as needed for wheezing or shortness of breath. INHALE 2 PUFFS INTO THE LUNGS EVERY 6 HOURS AS NEEDED FOR WHEEZING OR SHORTNESS OF BREATH) 8.5 g 3  . BEVESPI AEROSPHERE 9-4.8 MCG/ACT AERO INHALE 2 PUFFS INTO THE LUNGS TWICE DAILY (Patient taking differently: Inhale 2 puffs into the lungs in the morning and at bedtime.) 10.7 g 1  . Nutritional Supplements (FEEDING SUPPLEMENT, OSMOLITE 1.5 CAL,) LIQD Give 1 1/2 carton of osmolite 1.5 4 times per day (8am, noon, 4pm and 8pm) via feeding tube.  Flush with 85ml of water before and after feeding.  Drink or give additional 261ml TID of water between feedings.  0  . ondansetron (ZOFRAN) 8 MG tablet Take 1 tablet (8 mg total) by mouth 2 (two) times  daily as needed. Start on the third day after cisplatin chemotherapy. 30 tablet 1  . atorvastatin (LIPITOR) 40 MG tablet TAKE 1 TABLET(40 MG) BY MOUTH DAILY AT 6 PM (Patient not taking: No sig reported) 30 tablet 2  . Baclofen 5 MG TABS Take 5 mg by mouth at bedtime. (Patient not taking: No sig reported)    . buPROPion (WELLBUTRIN) 100 MG tablet TAKE 1 TABLET(100 MG) BY  MOUTH TWICE DAILY (Patient not taking: No sig reported) 60 tablet 2  . CALCIUM CARBONATE-VIT D-MIN PO Take 1,200 mg by mouth daily.  (Patient not taking: No sig reported)    . dexamethasone (DECADRON) 4 MG tablet Take 2 tablets (8 mg total) by mouth daily. Take daily x 3 days starting the day after cisplatin chemotherapy. Take with food. 30 tablet 1  . diclofenac Sodium (VOLTAREN) 1 % GEL Place 1 application onto the skin 4 (four) times daily as needed. (Patient not taking: No sig reported)    . folic acid (FOLVITE) 1 MG tablet Take 1 mg by mouth daily.  (Patient not taking: No sig reported)  3  . gabapentin (NEURONTIN) 100 MG capsule Take 2 capsules (200 mg total) by mouth 2 (two) times daily. (Patient not taking: No sig reported) 56 capsule 0  . hydroxychloroquine (PLAQUENIL) 200 MG tablet Take 200 mg by mouth 2 (two) times daily. (Patient not taking: Reported on 08/24/2020)    . lidocaine (XYLOCAINE) 2 % solution Patient: Mix 1part 2% viscous lidocaine, 1part H20. Swallow 19mL of diluted mixture, 57min before meals and at bedtime, up to QID (Patient not taking: Reported on 08/24/2020) 200 mL 4  . lidocaine-prilocaine (EMLA) cream Apply to affected area once (Patient not taking: Reported on 08/24/2020) 30 g 3  . LORazepam (ATIVAN) 0.5 MG tablet Take 1 tablet (0.5 mg total) by mouth every 6 (six) hours as needed (Nausea or vomiting). (Patient not taking: No sig reported) 30 tablet 0  . prochlorperazine (COMPAZINE) 10 MG tablet Take 1 tablet (10 mg total) by mouth every 6 (six) hours as needed (Nausea or vomiting). (Patient not taking: No  sig reported) 30 tablet 1  . sulfaSALAzine (AZULFIDINE) 500 MG EC tablet Take 500 mg by mouth 2 (two) times daily.  (Patient not taking: No sig reported)    . valACYclovir (VALTREX) 1000 MG tablet Take 1 tablet (1,000 mg total) by mouth 3 (three) times daily. (Patient not taking: No sig reported) 21 tablet 0   No current facility-administered medications for this visit.    PHYSICAL EXAMINATION: ECOG PERFORMANCE STATUS: 0 - Asymptomatic  Vitals:   08/24/20 1223  BP: 110/63  Pulse: 91  Resp: 14  Temp: (!) 97 F (36.1 C)  SpO2: 97%   Filed Weights   08/24/20 1223  Weight: 137 lb 8 oz (62.4 kg)    Physical Exam Constitutional:      Appearance: Normal appearance.  HENT:     Head: Normocephalic and atraumatic.     Mouth/Throat:     Mouth: Mucous membranes are dry.     Pharynx: No oropharyngeal exudate or posterior oropharyngeal erythema.  Eyes:     Extraocular Movements: Extraocular movements intact.     Pupils: Pupils are equal, round, and reactive to light.  Cardiovascular:     Rate and Rhythm: Normal rate and regular rhythm.     Pulses: Normal pulses.     Heart sounds: Normal heart sounds.  Pulmonary:     Effort: Pulmonary effort is normal.     Breath sounds: Normal breath sounds.  Abdominal:     General: Abdomen is flat. Bowel sounds are normal.     Palpations: Abdomen is soft.     Comments: PEG site looks clean   Musculoskeletal:        General: Swelling (left ankle swollen, likely from RA according to patient, she has had RA before in that leg) present. Normal range of motion.  Cervical back: Normal range of motion and neck supple. Tenderness (rad changes, no ulceration) present.  Lymphadenopathy:     Cervical: No cervical adenopathy (on gentle palpation, couldnt do deep palpation because of pain).  Skin:    General: Skin is warm and dry.  Neurological:     General: No focal deficit present.     Mental Status: She is alert.  Psychiatric:        Mood and  Affect: Mood normal.       LABORATORY DATA:  I have reviewed the data as listed Lab Results  Component Value Date   WBC 2.7 (L) 08/23/2020   HGB 13.4 08/23/2020   HCT 39.4 08/23/2020   MCV 89.7 08/23/2020   PLT 103 (L) 08/23/2020     Chemistry      Component Value Date/Time   NA 138 08/23/2020 1300   NA 139 06/02/2020 0845   K 3.7 08/23/2020 1300   CL 102 08/23/2020 1300   CO2 25 08/23/2020 1300   BUN 18 08/23/2020 1300   BUN 12 06/02/2020 0845   CREATININE 0.72 08/23/2020 1300   CREATININE 0.70 02/26/2016 1143      Component Value Date/Time   CALCIUM 8.4 (L) 08/23/2020 1300   ALKPHOS 44 06/26/2020 0658   AST 30 06/26/2020 0658   ALT 15 06/26/2020 0658   BILITOT 0.7 06/26/2020 0658   BILITOT 0.5 06/02/2020 0845       RADIOGRAPHIC STUDIES: I have personally reviewed the radiological images as listed and agreed with the findings in the report. IR Gastrostomy Tube  Result Date: 07/27/2020 INDICATION: Metastatic head and neck cancer (laryngeal cancer) EXAM: FLUOROSCOPIC 20 FRENCH PULL-THROUGH GASTROSTOMY Date:  07/27/2020 07/27/2020 10:42 am Radiologist:  M. Daryll Brod, MD Guidance:  Fluoroscopic MEDICATIONS: Ancef 2 g; Antibiotics were administered within 1 hour of the procedure. Glucagon 0.5 mg IV ANESTHESIA/SEDATION: Versed 1 mg IV; Fentanyl 100 mcg IV Moderate Sedation Time:  16 The patient was continuously monitored during the procedure by the interventional radiology nurse under my direct supervision. CONTRAST:  81mL OMNIPAQUE IOHEXOL 300 MG/ML SOLN - administered into the gastric lumen. FLUOROSCOPY TIME:  Fluoroscopy Time: 3 minutes 36 seconds (37 mGy). COMPLICATIONS: None immediate. PROCEDURE: Informed consent was obtained from the patient following explanation of the procedure, risks, benefits and alternatives. The patient understands, agrees and consents for the procedure. All questions were addressed. A time out was performed. Maximal barrier sterile technique  utilized including caps, mask, sterile gowns, sterile gloves, large sterile drape, hand hygiene, and betadine prep. The left upper quadrant was sterilely prepped and draped. An oral gastric catheter was inserted into the stomach under fluoroscopy. The existing nasogastric feeding tube was removed. Air was injected into the stomach for insufflation and visualization under fluoroscopy. The air distended stomach was confirmed beneath the anterior abdominal wall in the frontal and lateral projections. Under sterile conditions and local anesthesia, a 49 gauge trocar needle was utilized to access the stomach percutaneously beneath the left subcostal margin. Needle position was confirmed within the stomach under biplane fluoroscopy. Contrast injection confirmed position also. A single T tack was deployed for gastropexy. Over an Amplatz guide wire, a 9-French sheath was inserted into the stomach. A snare device was utilized to capture the oral gastric catheter. The snare device was pulled retrograde from the stomach up the esophagus and out the oropharynx. The 20-French pull-through gastrostomy was connected to the snare device and pulled antegrade through the oropharynx down the esophagus into the stomach and  then through the percutaneous tract external to the patient. The gastrostomy was assembled externally. Contrast injection confirms position in the stomach. Images were obtained for documentation. The patient tolerated procedure well. No immediate complication. IMPRESSION: Fluoroscopic insertion of a 20-French "pull-through" gastrostomy. Electronically Signed   By: Jerilynn Mages.  Shick M.D.   On: 07/27/2020 12:33   IR IMAGING GUIDED PORT INSERTION  Result Date: 07/27/2020 CLINICAL DATA:  METASTATIC HEAD NECK CANCER, ACCESS FOR CHEMOTHERAPY EXAM: RIGHT INTERNAL SINGLE LUMEN POWER PORT CATHETER INSERTION Date:  07/27/2020 07/27/2020 10:42 am Radiologist:  M. Daryll Brod, MD Guidance:  ULTRASOUND AND FLUOROSCOPIC MEDICATIONS:  ANCEF 2 G; The antibiotic was administered within an appropriate time interval prior to skin puncture. ANESTHESIA/SEDATION: Versed 4.0 mg IV; Fentanyl 100 mcg IV; Moderate Sedation Time:  20 MINUTES The patient was continuously monitored during the procedure by the interventional radiology nurse under my direct supervision. FLUOROSCOPY TIME:  ONE minutes, 12 seconds (8 mGy) COMPLICATIONS: None immediate. CONTRAST:  NONE. PROCEDURE: Informed consent was obtained from the patient following explanation of the procedure, risks, benefits and alternatives. The patient understands, agrees and consents for the procedure. All questions were addressed. A time out was performed. Maximal barrier sterile technique utilized including caps, mask, sterile gowns, sterile gloves, large sterile drape, hand hygiene, and 2% chlorhexidine scrub. Under sterile conditions and local anesthesia, RIGHT INTERNAL JUGULAR micropuncture venous access was performed. Access was performed with ultrasound. Images were obtained for documentation of the patent right internal jugular vein. A guide wire was inserted followed by a transitional dilator. This allowed insertion of a guide wire and catheter into the IVC. Measurements were obtained from the SVC / RA junction back to the right IJ venotomy site. In the right infraclavicular chest, a subcutaneous pocket was created over the second anterior rib. This was done under sterile conditions and local anesthesia. 1% lidocaine with epinephrine was utilized for this. A 2.5 cm incision was made in the skin. Blunt dissection was performed to create a subcutaneous pocket over the right pectoralis major muscle. The pocket was flushed with saline vigorously. There was adequate hemostasis. The port catheter was assembled and checked for leakage. The port catheter was secured in the pocket with two retention sutures. The tubing was tunneled subcutaneously to the right venotomy site and inserted into the SVC/RA  junction through a valved peel-away sheath. Position was confirmed with fluoroscopy. Images were obtained for documentation. The patient tolerated the procedure well. No immediate complications. Incisions were closed in a two layer fashion with 4 - 0 Vicryl suture. Dermabond was applied to the skin. The port catheter was accessed, blood was aspirated followed by saline and heparin flushes. Needle was removed. A dry sterile dressing was applied. IMPRESSION: Ultrasound and fluoroscopically guided right internal jugular single lumen power port catheter insertion. Tip in the SVC/RA junction. Catheter ready for use. Electronically Signed   By: Jerilynn Mages.  Shick M.D.   On: 07/27/2020 12:32    I have reviewed all pertinent images and pathology reports.  Pathology  PATHOLOGY SURGICAL PATHOLOGY  CASE: [id number]  PATIENT: Raven Torres  Surgical Pathology Report      Clinical History: laryngeal cancer (cm)      FINAL MICROSCOPIC DIAGNOSIS:   A. EPIGLOTTIS, BIOPSY:  - Focal poorly differentiated carcinoma, see comment.   COMMENT:   Most of the fragments have a dense lymphoid infiltrate with germinal  centers. One fragment has poorly differentiated cells at one edge. The  cells are positive for cytokeratin 5/6 and negative for  CD20. Thus, the  immunophenotype is consistent with a squamous cell carcinoma. Dr.  Vic Ripper has reviewed the case.    Labs were reviewed, satisfactory to proceed with chemotherapy  All questions were answered. The patient knows to call the clinic with any problems, questions or concerns. I spent  30 minutes in the care of this patient including H, review of medical records, documentation, counseling and coordination of care.     Benay Pike, MD 08/24/2020 1:41 PM

## 2020-08-25 ENCOUNTER — Inpatient Hospital Stay: Payer: Medicare Other

## 2020-08-25 ENCOUNTER — Ambulatory Visit: Payer: Medicare Other | Admitting: Hematology and Oncology

## 2020-08-25 ENCOUNTER — Other Ambulatory Visit: Payer: Self-pay

## 2020-08-25 ENCOUNTER — Other Ambulatory Visit: Payer: Medicare Other

## 2020-08-25 ENCOUNTER — Ambulatory Visit
Admission: RE | Admit: 2020-08-25 | Discharge: 2020-08-25 | Disposition: A | Discharge status: Home / Self Care | Payer: Medicare Other | Source: Ambulatory Visit | Attending: Radiation Oncology | Admitting: Radiation Oncology

## 2020-08-25 VITALS — BP 112/55 | HR 75 | Temp 98.2°F | Resp 18 | Wt 135.5 lb

## 2020-08-25 DIAGNOSIS — Z5111 Encounter for antineoplastic chemotherapy: Secondary | ICD-10-CM | POA: Diagnosis not present

## 2020-08-25 DIAGNOSIS — J441 Chronic obstructive pulmonary disease with (acute) exacerbation: Secondary | ICD-10-CM | POA: Diagnosis not present

## 2020-08-25 DIAGNOSIS — C329 Malignant neoplasm of larynx, unspecified: Secondary | ICD-10-CM | POA: Diagnosis not present

## 2020-08-25 DIAGNOSIS — C321 Malignant neoplasm of supraglottis: Secondary | ICD-10-CM

## 2020-08-25 DIAGNOSIS — F17210 Nicotine dependence, cigarettes, uncomplicated: Secondary | ICD-10-CM | POA: Diagnosis not present

## 2020-08-25 LAB — COMPREHENSIVE METABOLIC PANEL
ALT: 16 U/L (ref 0–44)
AST: 38 U/L (ref 15–41)
Albumin: 2.8 g/dL — ABNORMAL LOW (ref 3.5–5.0)
Alkaline Phosphatase: 52 U/L (ref 38–126)
Anion gap: 8 (ref 5–15)
BUN: 13 mg/dL (ref 8–23)
CO2: 27 mmol/L (ref 22–32)
Calcium: 8.2 mg/dL — ABNORMAL LOW (ref 8.9–10.3)
Chloride: 102 mmol/L (ref 98–111)
Creatinine, Ser: 0.71 mg/dL (ref 0.44–1.00)
GFR, Estimated: >60 mL/min (ref >60)
Glucose, Bld: 133 mg/dL — ABNORMAL HIGH (ref 70–99)
Potassium: 3.6 mmol/L (ref 3.5–5.1)
Sodium: 137 mmol/L (ref 135–145)
Total Bilirubin: 0.5 mg/dL (ref 0.3–1.2)
Total Protein: 5.8 g/dL — ABNORMAL LOW (ref 6.5–8.1)

## 2020-08-25 LAB — PHOSPHORUS: Phosphorus: 2.4 mg/dL — ABNORMAL LOW (ref 2.5–4.6)

## 2020-08-25 LAB — MAGNESIUM: Magnesium: 1.5 mg/dL — ABNORMAL LOW (ref 1.7–2.4)

## 2020-08-25 MED ORDER — SODIUM CHLORIDE 0.9 % IV SOLN
Freq: Once | INTRAVENOUS | Status: AC
  Filled 2020-08-25: qty 250

## 2020-08-25 MED ORDER — SODIUM CHLORIDE 0.9% FLUSH
10.0000 mL | INTRAVENOUS | Status: DC | PRN
  Administered 2020-08-25: 10 mL
  Filled 2020-08-25: qty 10

## 2020-08-25 MED ORDER — PALONOSETRON HCL INJECTION 0.25 MG/5ML
INTRAVENOUS | Status: AC
  Filled 2020-08-25: qty 5

## 2020-08-25 MED ORDER — SODIUM CHLORIDE 0.9 % IV SOLN
40.0000 mg/m2 | Freq: Once | INTRAVENOUS | Status: AC
  Administered 2020-08-25: 71 mg via INTRAVENOUS
  Filled 2020-08-25: qty 71

## 2020-08-25 MED ORDER — SODIUM CHLORIDE 0.9% FLUSH
10.0000 mL | Freq: Once | INTRAVENOUS | Status: AC | PRN
  Administered 2020-08-25: 10 mL
  Filled 2020-08-25: qty 10

## 2020-08-25 MED ORDER — MAGNESIUM SULFATE 2 GM/50ML IV SOLN
INTRAVENOUS | Status: AC
  Filled 2020-08-25: qty 50

## 2020-08-25 MED ORDER — HEPARIN SOD (PORK) LOCK FLUSH 100 UNIT/ML IV SOLN
500.0000 [IU] | Freq: Once | INTRAVENOUS | Status: AC | PRN
  Administered 2020-08-25: 500 [IU]
  Filled 2020-08-25: qty 5

## 2020-08-25 MED ORDER — SODIUM CHLORIDE 0.9 % IV SOLN
150.0000 mg | Freq: Once | INTRAVENOUS | Status: AC
  Administered 2020-08-25: 150 mg via INTRAVENOUS
  Filled 2020-08-25: qty 150

## 2020-08-25 MED ORDER — MAGNESIUM SULFATE 2 GM/50ML IV SOLN
2.0000 g | Freq: Once | INTRAVENOUS | Status: AC
  Administered 2020-08-25: 2 g via INTRAVENOUS

## 2020-08-25 MED ORDER — SODIUM CHLORIDE 0.9 % IV SOLN: Freq: Once | INTRAVENOUS | Status: DC

## 2020-08-25 MED ORDER — PALONOSETRON HCL INJECTION 0.25 MG/5ML
0.2500 mg | Freq: Once | INTRAVENOUS | Status: AC
  Administered 2020-08-25: 0.25 mg via INTRAVENOUS

## 2020-08-25 MED ORDER — SODIUM CHLORIDE 0.9 % IV SOLN
10.0000 mg | Freq: Once | INTRAVENOUS | Status: AC
  Administered 2020-08-25: 10 mg via INTRAVENOUS
  Filled 2020-08-25: qty 10

## 2020-08-25 MED ORDER — POTASSIUM CHLORIDE IN NACL 20-0.9 MEQ/L-% IV SOLN
Freq: Once | INTRAVENOUS | Status: AC
  Filled 2020-08-25: qty 1000

## 2020-08-25 NOTE — Progress Notes (Signed)
Patient has had 11mL output after 2 hour prefluids. Contacted Dr. Lindi Adie and received an order for 553mL NS over 30 min and will follow up with Dr. Lindi Adie if output less than 2101mL at that time.

## 2020-08-25 NOTE — Patient Instructions (Signed)
[facility] Discharge Instructions for Patients Receiving Chemotherapy  Today you received the following chemotherapy agents cisplatin  To help prevent nausea and vomiting after your treatment, we encourage you to take your nausea medication as directed   If you develop nausea and vomiting that is not controlled by your nausea medication, call the clinic.   BELOW ARE SYMPTOMS THAT SHOULD BE REPORTED IMMEDIATELY:  *FEVER GREATER THAN 100.5 F  *CHILLS WITH OR WITHOUT FEVER  NAUSEA AND VOMITING THAT IS NOT CONTROLLED WITH YOUR NAUSEA MEDICATION  *UNUSUAL SHORTNESS OF BREATH  *UNUSUAL BRUISING OR BLEEDING  TENDERNESS IN MOUTH AND THROAT WITH OR WITHOUT PRESENCE OF ULCERS  *URINARY PROBLEMS  *BOWEL PROBLEMS  UNUSUAL RASH Items with * indicate a potential emergency and should be followed up as soon as possible.  Feel free to call the clinic should you have any questions or concerns. The clinic phone number is [phone].  Please show the CHEMO ALERT CARD at check-in to the Emergency Department and triage nurse.   

## 2020-08-25 NOTE — Progress Notes (Signed)
Per Dr Chryl Heck, ok to use labs from 1/19.  Per Dr Lindi Adie, ok to proceed w/ 128mL of urine output

## 2020-08-28 ENCOUNTER — Inpatient Hospital Stay: Payer: Medicare Other

## 2020-08-28 ENCOUNTER — Ambulatory Visit
Admission: RE | Admit: 2020-08-28 | Discharge: 2020-08-28 | Disposition: A | Discharge status: Home / Self Care | Payer: Medicare Other | Source: Ambulatory Visit | Attending: Radiation Oncology | Admitting: Radiation Oncology

## 2020-08-28 ENCOUNTER — Other Ambulatory Visit: Payer: Self-pay

## 2020-08-28 VITALS — BP 150/71 | HR 85 | Temp 97.7°F | Resp 18 | Wt 135.5 lb

## 2020-08-28 DIAGNOSIS — C321 Malignant neoplasm of supraglottis: Secondary | ICD-10-CM

## 2020-08-28 DIAGNOSIS — F17210 Nicotine dependence, cigarettes, uncomplicated: Secondary | ICD-10-CM | POA: Diagnosis not present

## 2020-08-28 DIAGNOSIS — C329 Malignant neoplasm of larynx, unspecified: Secondary | ICD-10-CM | POA: Diagnosis not present

## 2020-08-28 DIAGNOSIS — Z5111 Encounter for antineoplastic chemotherapy: Secondary | ICD-10-CM | POA: Diagnosis not present

## 2020-08-28 LAB — COMPREHENSIVE METABOLIC PANEL
ALT: 17 U/L (ref 0–44)
AST: 34 U/L (ref 15–41)
Albumin: 2.9 g/dL — ABNORMAL LOW (ref 3.5–5.0)
Alkaline Phosphatase: 50 U/L (ref 38–126)
Anion gap: 9 (ref 5–15)
BUN: 20 mg/dL (ref 8–23)
CO2: 23 mmol/L (ref 22–32)
Calcium: 8.1 mg/dL — ABNORMAL LOW (ref 8.9–10.3)
Chloride: 107 mmol/L (ref 98–111)
Creatinine, Ser: 0.65 mg/dL (ref 0.44–1.00)
GFR, Estimated: >60 mL/min (ref >60)
Glucose, Bld: 96 mg/dL (ref 70–99)
Potassium: 3.4 mmol/L — ABNORMAL LOW (ref 3.5–5.1)
Sodium: 139 mmol/L (ref 135–145)
Total Bilirubin: 0.6 mg/dL (ref 0.3–1.2)
Total Protein: 5.9 g/dL — ABNORMAL LOW (ref 6.5–8.1)

## 2020-08-28 LAB — MAGNESIUM: Magnesium: 1.4 mg/dL — ABNORMAL LOW (ref 1.7–2.4)

## 2020-08-28 LAB — PHOSPHORUS: Phosphorus: 2.1 mg/dL — ABNORMAL LOW (ref 2.5–4.6)

## 2020-08-28 MED ORDER — SODIUM CHLORIDE 0.9 % IV SOLN
Freq: Once | INTRAVENOUS | Status: AC
  Filled 2020-08-28: qty 250

## 2020-08-28 MED ORDER — MAGNESIUM SULFATE 2 GM/50ML IV SOLN
INTRAVENOUS | Status: AC
  Filled 2020-08-28: qty 50

## 2020-08-28 MED ORDER — MAGNESIUM SULFATE 2 GM/50ML IV SOLN
2.0000 g | Freq: Once | INTRAVENOUS | Status: AC
  Administered 2020-08-28: 2 g via INTRAVENOUS

## 2020-08-28 MED ORDER — HEPARIN SOD (PORK) LOCK FLUSH 100 UNIT/ML IV SOLN
500.0000 [IU] | Freq: Once | INTRAVENOUS | Status: AC | PRN
Start: 2020-08-28 — End: 2020-08-28
  Administered 2020-08-28: 500 [IU]
  Filled 2020-08-28: qty 5

## 2020-08-28 MED ORDER — SODIUM CHLORIDE 0.9% FLUSH
10.0000 mL | Freq: Once | INTRAVENOUS | Status: AC | PRN
  Administered 2020-08-28: 10 mL
  Filled 2020-08-28: qty 10

## 2020-08-28 NOTE — Patient Instructions (Signed)
Dehydration, Adult Dehydration is a condition in which there is not enough water or other fluids in the body. This happens when a person loses more fluids than he or she takes in. Important organs, such as the kidneys, brain, and heart, cannot function without a proper amount of fluids. Any loss of fluids from the body can lead to dehydration. Dehydration can be mild, moderate, or severe. It should be treated right away to prevent it from becoming severe. What are the causes? Dehydration may be caused by:  Conditions that cause loss of water or other fluids, such as diarrhea, vomiting, or sweating or urinating a lot.  Not drinking enough fluids, especially when you are ill or doing activities that require a lot of energy.  Other illnesses and conditions, such as fever or infection.  Certain medicines, such as medicines that remove excess fluid from the body (diuretics).  Lack of safe drinking water.  Not being able to get enough water and food. What increases the risk? The following factors may make you more likely to develop this condition:  Having a long-term (chronic) illness that has not been treated properly, such as diabetes, heart disease, or kidney disease.  Being 65 years of age or older.  Having a disability.  Living in a place that is high in altitude, where thinner, drier air causes more fluid loss.  Doing exercises that put stress on your body for a long time (endurance sports). What are the signs or symptoms? Symptoms of dehydration depend on how severe it is. Mild or moderate dehydration  Thirst.  Dry lips or dry mouth.  Dizziness or light-headedness, especially when standing up from a seated position.  Muscle cramps.  Dark urine. Urine may be the color of tea.  Less urine or tears produced than usual.  Headache. Severe dehydration  Changes in skin. Your skin may be cold and clammy, blotchy, or pale. Your skin also may not return to normal after being  lightly pinched and released.  Little or no tears, urine, or sweat.  Changes in vital signs, such as rapid breathing and low blood pressure. Your pulse may be weak or may be faster than 100 beats a minute when you are sitting still.  Other changes, such as: ? Feeling very thirsty. ? Sunken eyes. ? Cold hands and feet. ? Confusion. ? Being very tired (lethargic) or having trouble waking from sleep. ? Short-term weight loss. ? Loss of consciousness. How is this diagnosed? This condition is diagnosed based on your symptoms and a physical exam. You may have blood and urine tests to help confirm the diagnosis. How is this treated? Treatment for this condition depends on how severe it is. Treatment should be started right away. Do not wait until dehydration becomes severe. Severe dehydration is an emergency and needs to be treated in a hospital.  Mild or moderate dehydration can be treated at home. You may be asked to: ? Drink more fluids. ? Drink an oral rehydration solution (ORS). This drink helps restore proper amounts of fluids and salts and minerals in the blood (electrolytes).  Severe dehydration can be treated: ? With IV fluids. ? By correcting abnormal levels of electrolytes. This is often done by giving electrolytes through a tube that is passed through your nose and into your stomach (nasogastric tube, or NG tube). ? By treating the underlying cause of dehydration. Follow these instructions at home: Oral rehydration solution If told by your health care provider, drink an ORS:  Make   an ORS by following instructions on the package.  Start by drinking small amounts, about  cup (120 mL) every 5-10 minutes.  Slowly increase how much you drink until you have taken the amount recommended by your health care provider. Eating and drinking  Drink enough clear fluid to keep your urine pale yellow. If you were told to drink an ORS, finish the ORS first and then start slowly drinking  other clear fluids. Drink fluids such as: ? Water. Do not drink only water. Doing that can lead to hyponatremia, which is having too little salt (sodium) in the body. ? Water from ice chips you suck on. ? Fruit juice that you have added water to (diluted fruit juice). ? Low-calorie sports drinks.  Eat foods that contain a healthy balance of electrolytes, such as bananas, oranges, potatoes, tomatoes, and spinach.  Do not drink alcohol.  Avoid the following: ? Drinks that contain a lot of sugar. These include high-calorie sports drinks, fruit juice that is not diluted, and soda. ? Caffeine. ? Foods that are greasy or contain a lot of fat or sugar.         General instructions  Take over-the-counter and prescription medicines only as told by your health care provider.  Do not take sodium tablets. Doing that can lead to having too much sodium in the body (hypernatremia).  Return to your normal activities as told by your health care provider. Ask your health care provider what activities are safe for you.  Keep all follow-up visits as told by your health care provider. This is important. Contact a health care provider if:  You have muscle cramps, pain, or discomfort, such as: ? Pain in your abdomen and the pain gets worse or stays in one area (localizes). ? Stiff neck.  You have a rash.  You are more irritable than usual.  You are sleepier or have a harder time waking than usual.  You feel weak or dizzy.  You feel very thirsty. Get help right away if you have:  Any symptoms of severe dehydration.  Symptoms of vomiting, such as: ? You cannot eat or drink without vomiting. ? Vomiting gets worse or does not go away. ? Vomit includes blood or green matter (bile).  Symptoms that get worse with treatment.  A fever.  A severe headache.  Problems with urination or bowel movements, such as: ? Diarrhea that gets worse or does not go away. ? Blood in your stool (feces).  This may cause stool to look black and tarry. ? Not urinating, or urinating only a small amount of very dark urine, within 6-8 hours.  Trouble breathing. These symptoms may represent a serious problem that is an emergency. Do not wait to see if the symptoms will go away. Get medical help right away. Call your local emergency services (911 in the [place] Do not drive yourself to the hospital. Summary  Dehydration is a condition in which there is not enough water or other fluids in the body. This happens when a person loses more fluids than he or she takes in.  Treatment for this condition depends on how severe it is. Treatment should be started right away. Do not wait until dehydration becomes severe.  Drink enough clear fluid to keep your urine pale yellow. If you were told to drink an oral rehydration solution (ORS), finish the ORS first and then start slowly drinking other clear fluids.  Take over-the-counter and prescription medicines only as told by your health   care provider.  Get help right away if you have any symptoms of severe dehydration. This information is not intended to replace advice given to you by your health care provider. Make sure you discuss any questions you have with your health care provider. Document Revised: 03/04/2019 Document Reviewed: 03/04/2019 Elsevier Patient Education  2021 Elsevier Inc.  

## 2020-08-29 ENCOUNTER — Other Ambulatory Visit: Payer: Self-pay

## 2020-08-29 ENCOUNTER — Inpatient Hospital Stay: Payer: Medicare Other

## 2020-08-29 ENCOUNTER — Ambulatory Visit
Admission: RE | Admit: 2020-08-29 | Discharge: 2020-08-29 | Disposition: A | Discharge status: Home / Self Care | Payer: Medicare Other | Source: Ambulatory Visit | Attending: Radiation Oncology | Admitting: Radiation Oncology

## 2020-08-29 DIAGNOSIS — C329 Malignant neoplasm of larynx, unspecified: Secondary | ICD-10-CM | POA: Diagnosis not present

## 2020-08-29 DIAGNOSIS — C321 Malignant neoplasm of supraglottis: Secondary | ICD-10-CM | POA: Diagnosis not present

## 2020-08-29 NOTE — Progress Notes (Signed)
Nutrition Follow-up:  Patien twith laryngeal cancer. PEG placed on 07/27/20.  Met with patient following radiation. Reports that she is not feeling well today.  Received IV fluids yesterday.   Reports that she is giving 2 cartons of osmolite 1.5 via feeding tube. Reports that she is eating soups. She is not drinking boost/ensure shakes as does not have any currently.     Medications: reviewed  Labs: Potassium 3.4, Phosphorus 2.1, Mag 1.4.    Anthropometrics:   Weight 135 lb 8 oz on 1/24   Estimated Energy Needs  Kcals: [id number] Protein: 100-117 g Fluid: < 2 L  NUTRITION DIAGNOSIS: Predicted suboptimal energy intake continues   INTERVENTION:  Recommend supplementing refeeding labs. Message sent to medical oncology and radiation oncology. Discussed with patient the importance of providing nutrition via feeding tube.   Patient to give tube feeding when she gets home today around 1pm and RD encouraged another carton of tube feeding around 5pm today.  Goal rate is 6 cartons per day. Patient to add 1/2 carton daily.      MONITORING, EVALUATION, GOAL: weight trends, intake, tube feeding   NEXT VISIT: Feb 1 (Tuesday) with Jerrell Belfast B. Zenia Resides, [place], [place] Registered Dietitian [phone] (mobile)

## 2020-08-30 ENCOUNTER — Other Ambulatory Visit: Payer: Self-pay | Admitting: Hematology and Oncology

## 2020-08-30 ENCOUNTER — Ambulatory Visit: Payer: Medicare Other

## 2020-08-30 ENCOUNTER — Inpatient Hospital Stay: Payer: Medicare Other

## 2020-08-30 MED ORDER — K-PHOS-NEUTRAL 155-852-130 MG PO TABS: 2.0000 | ORAL_TABLET | Freq: Four times a day (QID) | ORAL | 0 refills | Status: AC

## 2020-08-30 NOTE — Progress Notes (Signed)
Oncology Nurse Navigator Documentation  Ms. Mortimer called me this morning. She has cancelled her radiation treatment today due to experiencing diarrhea at home. She is aware to take antidiarrheals as instructed during chemotherapy education. She tells me that she will be here tomorrow for treatment. I have notified the treatment machine.   Harlow Asa RN, BSN, OCN Head & Neck Oncology Nurse [place] [facility] Phone # [phone]  Fax # [phone]

## 2020-08-31 ENCOUNTER — Inpatient Hospital Stay (HOSPITAL_BASED_OUTPATIENT_CLINIC_OR_DEPARTMENT_OTHER): Payer: Medicare Other | Admitting: Hematology and Oncology

## 2020-08-31 ENCOUNTER — Telehealth: Payer: Self-pay

## 2020-08-31 ENCOUNTER — Encounter: Payer: Self-pay | Admitting: Hematology and Oncology

## 2020-08-31 ENCOUNTER — Ambulatory Visit
Admission: RE | Admit: 2020-08-31 | Discharge: 2020-08-31 | Disposition: A | Discharge status: Home / Self Care | Payer: Medicare Other | Source: Ambulatory Visit | Attending: Radiation Oncology | Admitting: Radiation Oncology

## 2020-08-31 ENCOUNTER — Other Ambulatory Visit: Payer: Self-pay

## 2020-08-31 ENCOUNTER — Inpatient Hospital Stay: Payer: Medicare Other

## 2020-08-31 VITALS — BP 76/52 | HR 64 | Temp 99.5°F | Resp 17 | Ht 66.0 in | Wt 131.3 lb

## 2020-08-31 DIAGNOSIS — C329 Malignant neoplasm of larynx, unspecified: Secondary | ICD-10-CM | POA: Diagnosis not present

## 2020-08-31 DIAGNOSIS — C321 Malignant neoplasm of supraglottis: Secondary | ICD-10-CM | POA: Diagnosis not present

## 2020-08-31 DIAGNOSIS — F17210 Nicotine dependence, cigarettes, uncomplicated: Secondary | ICD-10-CM | POA: Diagnosis not present

## 2020-08-31 DIAGNOSIS — Z5111 Encounter for antineoplastic chemotherapy: Secondary | ICD-10-CM | POA: Diagnosis not present

## 2020-08-31 LAB — MAGNESIUM: Magnesium: 1.2 mg/dL — ABNORMAL LOW (ref 1.7–2.4)

## 2020-08-31 LAB — CBC WITH DIFFERENTIAL/PLATELET
Abs Immature Granulocytes: 0.03 10*3/uL (ref 0.00–0.07)
Basophils Absolute: 0 10*3/uL (ref 0.0–0.1)
Basophils Relative: 0 %
Eosinophils Absolute: 0 10*3/uL (ref 0.0–0.5)
Eosinophils Relative: 0 %
HCT: 38.2 % (ref 36.0–46.0)
Hemoglobin: 13 g/dL (ref 12.0–15.0)
Immature Granulocytes: 1 %
Lymphocytes Relative: 3 %
Lymphs Abs: 0.1 10*3/uL — ABNORMAL LOW (ref 0.7–4.0)
MCH: 30 pg (ref 26.0–34.0)
MCHC: 34 g/dL (ref 30.0–36.0)
MCV: 88.2 fL (ref 80.0–100.0)
Monocytes Absolute: 0.2 10*3/uL (ref 0.1–1.0)
Monocytes Relative: 6 %
Neutro Abs: 3.2 10*3/uL (ref 1.7–7.7)
Neutrophils Relative %: 90 %
Platelets: 56 10*3/uL — ABNORMAL LOW (ref 150–400)
RBC: 4.33 MIL/uL (ref 3.87–5.11)
RDW: 14.2 % (ref 11.5–15.5)
WBC: 3.6 10*3/uL — ABNORMAL LOW (ref 4.0–10.5)
nRBC: 0 % (ref 0.0–0.2)

## 2020-08-31 LAB — BASIC METABOLIC PANEL
Anion gap: 10 (ref 5–15)
BUN: 17 mg/dL (ref 8–23)
CO2: 30 mmol/L (ref 22–32)
Calcium: 8.5 mg/dL — ABNORMAL LOW (ref 8.9–10.3)
Chloride: 98 mmol/L (ref 98–111)
Creatinine, Ser: 0.72 mg/dL (ref 0.44–1.00)
GFR, Estimated: >60 mL/min (ref >60)
Glucose, Bld: 125 mg/dL — ABNORMAL HIGH (ref 70–99)
Potassium: 3.8 mmol/L (ref 3.5–5.1)
Sodium: 138 mmol/L (ref 135–145)

## 2020-08-31 LAB — PHOSPHORUS: Phosphorus: 1.7 mg/dL — ABNORMAL LOW (ref 2.5–4.6)

## 2020-08-31 MED ORDER — SODIUM CHLORIDE 0.9 % IV SOLN
INTRAVENOUS | Status: DC
  Filled 2020-08-31: qty 250

## 2020-08-31 NOTE — Progress Notes (Signed)
[name] NOTE  Patient Care Team: Ladell Pier, MD as PCP - General (Internal Medicine) Debara Pickett Nadean Corwin, MD as PCP - Cardiology (Cardiology)  CHIEF COMPLAINTS/PURPOSE OF CONSULTATION:   Supraglottic cancer   ASSESSMENT & PLAN:   No problem-specific Assessment & Plan notes found for this encounter.  No orders of the defined types were placed in this encounter.  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 We have discussed her in the ENT multidisciplinary conference and there was a question if the T staging was a T3 versus a T4.  But since there is no conclusive evidence of T4 and since the patient also prefers no surgery at this time, we believe it is reasonable to proceed with concurrent chemoradiation.  She completed 5 weekly cycles of chemotherapy, so far doing well. No major toxicity from chemo. No hearing loss or nephrotoxicity so far. Thrombocytopenia with platelet count of 56, will omit tomorrow's chemo, anticipate last cycle in a week.  2. Grade 1 nausea, dexamethasone has been helping, recommended as needed Zofran. 3. Grade 1/2 mucositis, unable to swallow food form couple days, using G tube, encouraged to use this. Continues to lose weight, lost about 4 lbs. 4. Fatigue, grade 1 from concurrent chemoradiation.  5. Electrolyte imbalance. Will replace fluids and mag, encouraged her to start phosphorus supplements asap.  RTC in one week for FU, labs and infusion.   Thank you for consulting [place] in the care of this patient.   Please do not hesitate to contact us with any additional questions or concerns.  HISTORY OF PRESENTING ILLNESS:   Raven Torres 65 y.o. female is here because of new diagnosis of laryngeal cancer  Chronology  Raven Torres is a 65 y.o. female who presents for evaluation of weight loss and enlarged right neck node.  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 She has had a approximate 30 pound weight loss over the past 4 months.  She is having no airway problems or hoarseness.Raven Torres is a 65 y.o. female who presents for evaluation of weight loss and enlarged right neck node. She has had a approximate 30 pound weight loss over the past 4 months.  She has no hoarseness or airway problems. On fiberoptic laryngoscopy she has abnormality of the laryngeal surface of the epiglottis and is taken to the operating room for direct laryngoscopy and biopsy.  06/16/2020 2.0 x 3.2 x 2.5 cm supraglottic laryngeal mass involving the epiglottis and extending along the aryepiglottic folds bilaterally. Tumor extends into the pre-epiglottic fat and likely into the vallecula. Tumor extends into the upper thyroid cartilage bilaterally, abutting and possibly extending through the outer cortex (particularly on the left). This almost certainly reflects a supraglottic laryngeal squamous cell carcinoma  06/26/2020, she had laryngoscopy which showed there was a friable ulcerative tumor involving predominantly the  supraglottic area above the vocal cords as the vocal cords were clear to evaluation.  This extended up the laryngeal surface of  epiglottis, more on the right side. A direct laryngoscopy was also performed  of the piriform sinuses which were clear bilaterally.  06/28/2020  1. The supraglottic mass has a maximum SUV of 12.5 and the  enlarged right level II lymph node has a maximum SUV of 16.9, both compatible with malignancy. No findings of metastatic disease to the chest, abdomen, pelvis, or regional skeleton. 2. New lingular ground-glass opacity compared to 01/20/2020 with very low-grade activity, probably from alveolitis or low-grade atypical infection. 3. Aortic Atherosclerosis (ICD10-I70.0) and Emphysema (ICD10-J43.9). 4. Airway thickening is present, suggesting bronchitis or reactive airways disease. 5. 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 6.  Prominent stool throughout the colon favors constipation.  Her case was discussed in the tumor board, and radiology review suggested that there is no definitive evidence of T4 involvement, hence plan was to consider concurrent chemoradiation and thus medical oncology referral.  She has baseline history of COPD, not oxygen dependent. She is able to perform her all ADL's. She says memory has been an issue, she is forgetful at times. She had TB 3 yrs ago and was treated for this at [facility]. She has RA, takes plaquenil and sulfasalazine  Hep C induced cirrhosis, Hep C cured by Interferon, cirrhosis well compensated. She is very anxious, lives with her daughter,  She smoked about 1-2 PPD and is now smoking about 2 packs in a week. No alcohol, only when she was very young. She denies any pain in her neck, swallowing, speech or hearing.  Started concurrent chemoradiation on 07/25/2020 Completed 5 weekly cycles of chemotherapy She is feeling very tired today, able to swallow some soup, drinks boost and has been using G tube. Using biotin and viscous lidocaine for the pain, this has been helpful. She wasn't insisting on more pain medication at this time. Mild nausea, no vomiting 4-5 bowel movements a day, attributes to the feeds. No urinary complaints No hearing loss No neuropathy Some leg  swelling, left ankle, from her RA, at baseline. Rest of the pertinent 10 point ROS reviewed and negative  MEDICAL HISTORY:  Past Medical History:  Diagnosis Date  . Arthritis   . Cataract    removed both eyes   . Colitis   . COPD (chronic obstructive pulmonary disease) ([place])   . Hepatitis C   . Hyperlipidemia   . Hypertension   . Shingles 03/27/2020  . TB (tuberculosis)    treatment     SURGICAL HISTORY: Past Surgical History:  Procedure Laterality Date  . ABDOMINAL HYSTERECTOMY    . CATARACT EXTRACTION, BILATERAL    . COLONOSCOPY    . COLONOSCOPY  04/04/2020  . DIRECT LARYNGOSCOPY N/A 06/26/2020   Procedure: DIRECT LARYNGOSCOPY AND BIOPSY;  Surgeon: Rozetta Nunnery, MD;  Location: [facility];  Service: ENT;  Laterality: N/A;  . IR GASTROSTOMY TUBE MOD SED  07/27/2020  . IR IMAGING GUIDED PORT INSERTION  07/27/2020  . KNEE SURGERY Right   . POLYPECTOMY  11/17/2008   HPP x 1     SOCIAL HISTORY: Social History   Socioeconomic History  . Marital status: Married    Spouse name: Not on file  . Number of children: Not on file  . Years of education: Not on file  . Highest education level: Not on file  Occupational History  . Not on file  Tobacco Use  . Smoking status: Current Every Day Smoker    Packs/day: 1.00    Years: 50.00  Pack years: 50.00    Types: Cigarettes    Start date: 47  . Smokeless tobacco: Never Used  . Tobacco comment: 4 cigs a day   Vaping Use  . Vaping Use: Never used  Substance and Sexual Activity  . Alcohol use: No    Alcohol/week: 0.0 standard drinks  . Drug use: No  . Sexual activity: Not Currently  Other Topics Concern  . Not on file  Social History Narrative  . Not on file   Social Determinants of Health   Financial Resource Strain: Not on file  Food Insecurity: No Food Insecurity  . Worried About Charity fundraiser in the Last Year: Never true  . Ran Out of Food in the Last Year: Never true  Transportation Needs: No  Transportation Needs  . Lack of Transportation (Medical): No  . Lack of Transportation (Non-Medical): No  Physical Activity: Not on file  Stress: Not on file  Social Connections: Not on file  Intimate Partner Violence: Not on file    FAMILY HISTORY: Family History  Problem Relation Age of Onset  . Heart disease Mother   . Cancer Mother   . Melanoma Mother   . Esophageal cancer Father   . Colon cancer Neg Hx   . Colon polyps Neg Hx   . Rectal cancer Neg Hx   . Stomach cancer Neg Hx     ALLERGIES:  is allergic to tudorza pressair [aclidinium bromide], amitriptyline, codeine, and tramadol.  MEDICATIONS:  Current Outpatient Medications  Medication Sig Dispense Refill  . albuterol (PROVENTIL) (2.5 MG/3ML) 0.083% nebulizer solution Take 3 mLs (2.5 mg total) by nebulization every 6 (six) hours as needed for wheezing or shortness of breath. 150 mL 1  . albuterol (VENTOLIN HFA) 108 (90 Base) MCG/ACT inhaler INHALE 2 PUFFS INTO THE LUNGS EVERY 6 HOURS AS NEEDED FOR WHEEZING OR SHORTNESS OF BREATH (Patient taking differently: Inhale 2 puffs into the lungs every 6 (six) hours as needed for wheezing or shortness of breath. INHALE 2 PUFFS INTO THE LUNGS EVERY 6 HOURS AS NEEDED FOR WHEEZING OR SHORTNESS OF BREATH) 8.5 g 3  . atorvastatin (LIPITOR) 40 MG tablet TAKE 1 TABLET(40 MG) BY MOUTH DAILY AT 6 PM 30 tablet 2  . Baclofen 5 MG TABS Take 5 mg by mouth at bedtime.    Marland Kitchen BEVESPI AEROSPHERE 9-4.8 MCG/ACT AERO INHALE 2 PUFFS INTO THE LUNGS TWICE DAILY (Patient taking differently: Inhale 2 puffs into the lungs in the morning and at bedtime.) 10.7 g 1  . buPROPion (WELLBUTRIN) 100 MG tablet TAKE 1 TABLET(100 MG) BY MOUTH TWICE DAILY 60 tablet 2  . CALCIUM CARBONATE-VIT D-MIN PO Take 1,200 mg by mouth daily.    Marland Kitchen dexamethasone (DECADRON) 4 MG tablet Take 2 tablets (8 mg total) by mouth daily. Take daily x 3 days starting the day after cisplatin chemotherapy. Take with food. 30 tablet 1  . diclofenac  Sodium (VOLTAREN) 1 % GEL Place 1 application onto the skin 4 (four) times daily as needed.    . folic acid (FOLVITE) 1 MG tablet Take 1 mg by mouth daily.  3  . gabapentin (NEURONTIN) 100 MG capsule Take 2 capsules (200 mg total) by mouth 2 (two) times daily. 56 capsule 0  . hydroxychloroquine (PLAQUENIL) 200 MG tablet Take 200 mg by mouth 2 (two) times daily.    . K Phos Mono-Sod Phos Di & Mono (K-PHOS-NEUTRAL) [phone] MG TABS Take 2 tablets by mouth in the morning, at noon,  in the evening, and at bedtime. Ok to dissolve tablets in 8 oz of water and put in feeding tube. 240 tablet 0  . lidocaine (XYLOCAINE) 2 % solution Patient: Mix 1part 2% viscous lidocaine, 1part H20. Swallow 33mL of diluted mixture, 44min before meals and at bedtime, up to QID 200 mL 4  . lidocaine-prilocaine (EMLA) cream Apply to affected area once 30 g 3  . LORazepam (ATIVAN) 0.5 MG tablet Take 1 tablet (0.5 mg total) by mouth every 6 (six) hours as needed (Nausea or vomiting). 30 tablet 0  . Nutritional Supplements (FEEDING SUPPLEMENT, OSMOLITE 1.5 CAL,) LIQD Give 1 1/2 carton of osmolite 1.5 4 times per day (8am, noon, 4pm and 8pm) via feeding tube.  Flush with 82ml of water before and after feeding.  Drink or give additional 29ml TID of water between feedings.  0  . ondansetron (ZOFRAN) 8 MG tablet Take 1 tablet (8 mg total) by mouth 2 (two) times daily as needed. Start on the third day after cisplatin chemotherapy. 30 tablet 1  . prochlorperazine (COMPAZINE) 10 MG tablet Take 1 tablet (10 mg total) by mouth every 6 (six) hours as needed (Nausea or vomiting). 30 tablet 1  . sulfaSALAzine (AZULFIDINE) 500 MG EC tablet Take 500 mg by mouth 2 (two) times daily.    . valACYclovir (VALTREX) 1000 MG tablet Take 1 tablet (1,000 mg total) by mouth 3 (three) times daily. 21 tablet 0   No current facility-administered medications for this visit.    PHYSICAL EXAMINATION: ECOG PERFORMANCE STATUS: 0 - Asymptomatic  Vitals:    08/31/20 1352  BP: (!) 76/52  Pulse: 64  Resp: 17  Temp: 99.5 F (37.5 C)  SpO2: 95%   Filed Weights   08/31/20 1352  Weight: 131 lb 4.8 oz (59.6 kg)    Physical Exam Constitutional:      Appearance: Normal appearance.  HENT:     Head: Normocephalic and atraumatic.     Mouth/Throat:     Mouth: Mucous membranes are dry.     Pharynx: No oropharyngeal exudate or posterior oropharyngeal erythema.  Eyes:     Extraocular Movements: Extraocular movements intact.     Pupils: Pupils are equal, round, and reactive to light.  Cardiovascular:     Rate and Rhythm: Normal rate and regular rhythm.     Pulses: Normal pulses.     Heart sounds: Normal heart sounds.  Pulmonary:     Effort: Pulmonary effort is normal.     Breath sounds: Normal breath sounds.  Abdominal:     General: Abdomen is flat. There is no distension.     Palpations: Abdomen is soft. There is no mass.     Tenderness: There is no abdominal tenderness.     Comments: PEG site looks clean, some serous discharge, no evidence of infection    Musculoskeletal:        General: Swelling (left ankle swollen, likely from RA according to patient, she has had RA before in that leg) present. No tenderness. Normal range of motion.     Cervical back: Normal range of motion and neck supple. Tenderness: rad changes, no ulceration.  Lymphadenopathy:     Cervical: No cervical adenopathy (No palpable cervical lymphadenopathy).  Skin:    General: Skin is warm and dry.  Neurological:     General: No focal deficit present.     Mental Status: She is alert.  Psychiatric:        Mood and Affect: Mood normal.  LABORATORY DATA:  I have reviewed the data as listed Lab Results  Component Value Date   WBC 3.6 (L) 08/31/2020   HGB 13.0 08/31/2020   HCT 38.2 08/31/2020   MCV 88.2 08/31/2020   PLT 56 (L) 08/31/2020     Chemistry      Component Value Date/Time   NA 138 08/31/2020 1328   NA 139 06/02/2020 0845   K 3.8  08/31/2020 1328   CL 98 08/31/2020 1328   CO2 30 08/31/2020 1328   BUN 17 08/31/2020 1328   BUN 12 06/02/2020 0845   CREATININE 0.72 08/31/2020 1328   CREATININE 0.70 02/26/2016 1143      Component Value Date/Time   CALCIUM 8.5 (L) 08/31/2020 1328   ALKPHOS 50 08/28/2020 0959   AST 34 08/28/2020 0959   ALT 17 08/28/2020 0959   BILITOT 0.6 08/28/2020 0959   BILITOT 0.5 06/02/2020 0845       RADIOGRAPHIC STUDIES: I have personally reviewed the radiological images as listed and agreed with the findings in the report. No results found.  I have reviewed all pertinent images and pathology reports.  Pathology  PATHOLOGY SURGICAL PATHOLOGY  CASE: [id number]  PATIENT: Doreatha Martin  Surgical Pathology Report      Clinical History: laryngeal cancer (cm)      FINAL MICROSCOPIC DIAGNOSIS:   A. EPIGLOTTIS, BIOPSY:  - Focal poorly differentiated carcinoma, see comment.   COMMENT:   Most of the fragments have a dense lymphoid infiltrate with germinal  centers. One fragment has poorly differentiated cells at one edge. The  cells are positive for cytokeratin 5/6 and negative for CD20. Thus, the  immunophenotype is consistent with a squamous cell carcinoma. Dr.  Vic Ripper has reviewed the case.    Labs were reviewed, will omit chemo tomorrow IV fluids and Mag today. She will start oral phosphorus supplementation.  All questions were answered. The patient knows to call the clinic with any problems, questions or concerns. I spent  30 minutes in the care of this patient including H, review of medical records, documentation, counseling and coordination of care.     Benay Pike, MD 08/31/2020 2:09 PM

## 2020-08-31 NOTE — Telephone Encounter (Signed)
After the patient was evaluated by Dr. Chryl Heck today, Dr. Chryl Heck wanted the Patient to stay and receive 4 grams of Magnesium and 1 Liter of fluid. We didn't have any space at the time so we asked the patient if she would wait 30 minutes to receive mag and fluids. I expressed how vital it was to the patient to wait and explained to her that her blood pressure was very low at 76/52. The patient stated that she understood and agreed to wait in the reception area until we could bring her back. Once we had room for her we tried to find the patient but she decided to leave. I tried calling her many times and was unable to reach her. I called the daughter and had to leave a detailed message on her VM that her mother decided to leave but that she would need to report to the ER to receive fluids at this time.

## 2020-08-31 NOTE — Telephone Encounter (Signed)
I tried reaching out to the Patient and the Patient's daughter to advise them that Dr. Chryl Heck had sent the patient Phosphorus tablets yesterday. Dr. Chryl Heck wanted me to advise them that the phosphorus tablets should be dissolved in 8 oz of water and fed through the G tube. Neither the patient or her daughter picked up the phone but I left a detailed message on the daughter's VM with the details above. Pt has an appointment with Dr. Chryl Heck today and we will discuss this with her here.

## 2020-09-01 ENCOUNTER — Inpatient Hospital Stay: Payer: Medicare Other

## 2020-09-01 ENCOUNTER — Other Ambulatory Visit: Payer: Medicare Other

## 2020-09-01 ENCOUNTER — Ambulatory Visit
Admission: RE | Admit: 2020-09-01 | Discharge: 2020-09-01 | Disposition: A | Discharge status: Home / Self Care | Payer: Medicare Other | Source: Ambulatory Visit | Attending: Radiation Oncology | Admitting: Radiation Oncology

## 2020-09-01 ENCOUNTER — Ambulatory Visit: Payer: Medicare Other | Admitting: Hematology and Oncology

## 2020-09-01 ENCOUNTER — Telehealth: Payer: Self-pay

## 2020-09-01 DIAGNOSIS — C329 Malignant neoplasm of larynx, unspecified: Secondary | ICD-10-CM | POA: Diagnosis not present

## 2020-09-01 DIAGNOSIS — C321 Malignant neoplasm of supraglottis: Secondary | ICD-10-CM | POA: Diagnosis not present

## 2020-09-01 NOTE — Telephone Encounter (Signed)
After the Patient left yesterday without receiving fluids or mag and with her BP at 76/52 I tried to contact her again this morning to check on her. I called the patient twice and left her a VM. I also tried contacting her daughter again and left a VM for her as well asking for them to call me back with an update on how the Patient is doing.

## 2020-09-04 ENCOUNTER — Other Ambulatory Visit: Payer: Self-pay | Admitting: Internal Medicine

## 2020-09-04 ENCOUNTER — Other Ambulatory Visit: Payer: Self-pay

## 2020-09-04 ENCOUNTER — Telehealth: Payer: Self-pay | Admitting: Hematology and Oncology

## 2020-09-04 ENCOUNTER — Ambulatory Visit
Admission: RE | Admit: 2020-09-04 | Discharge: 2020-09-04 | Disposition: A | Discharge status: Home / Self Care | Payer: Medicare Other | Source: Ambulatory Visit | Attending: Radiation Oncology | Admitting: Radiation Oncology

## 2020-09-04 DIAGNOSIS — C329 Malignant neoplasm of larynx, unspecified: Secondary | ICD-10-CM | POA: Diagnosis not present

## 2020-09-04 DIAGNOSIS — I708 Atherosclerosis of other arteries: Secondary | ICD-10-CM

## 2020-09-04 DIAGNOSIS — C321 Malignant neoplasm of supraglottis: Secondary | ICD-10-CM | POA: Diagnosis not present

## 2020-09-04 DIAGNOSIS — I7090 Unspecified atherosclerosis: Secondary | ICD-10-CM

## 2020-09-04 DIAGNOSIS — F17200 Nicotine dependence, unspecified, uncomplicated: Secondary | ICD-10-CM

## 2020-09-04 NOTE — Telephone Encounter (Signed)
Scheduled appointments per 1/31 inbasket msg from Dr. Isidore Moos and Dr. Chryl Heck. Called patient, no answer. Left message for patient with all appointments dates and times.

## 2020-09-05 ENCOUNTER — Ambulatory Visit
Admission: RE | Admit: 2020-09-05 | Discharge: 2020-09-05 | Disposition: A | Discharge status: Home / Self Care | Payer: Medicare Other | Source: Ambulatory Visit | Attending: Radiation Oncology | Admitting: Radiation Oncology

## 2020-09-05 ENCOUNTER — Encounter: Payer: Self-pay | Admitting: Radiation Oncology

## 2020-09-05 ENCOUNTER — Inpatient Hospital Stay: Payer: Medicare Other | Attending: Hematology and Oncology

## 2020-09-05 ENCOUNTER — Inpatient Hospital Stay: Payer: Medicare Other

## 2020-09-05 ENCOUNTER — Encounter: Payer: Medicare Other | Admitting: Nutrition

## 2020-09-05 ENCOUNTER — Telehealth: Payer: Self-pay

## 2020-09-05 ENCOUNTER — Other Ambulatory Visit: Payer: Self-pay

## 2020-09-05 VITALS — BP 105/86 | HR 96 | Temp 98.5°F | Resp 18

## 2020-09-05 DIAGNOSIS — C321 Malignant neoplasm of supraglottis: Secondary | ICD-10-CM | POA: Diagnosis not present

## 2020-09-05 DIAGNOSIS — C329 Malignant neoplasm of larynx, unspecified: Secondary | ICD-10-CM | POA: Insufficient documentation

## 2020-09-05 DIAGNOSIS — Z95828 Presence of other vascular implants and grafts: Secondary | ICD-10-CM | POA: Insufficient documentation

## 2020-09-05 DIAGNOSIS — F17210 Nicotine dependence, cigarettes, uncomplicated: Secondary | ICD-10-CM | POA: Diagnosis not present

## 2020-09-05 DIAGNOSIS — E878 Other disorders of electrolyte and fluid balance, not elsewhere classified: Secondary | ICD-10-CM | POA: Diagnosis not present

## 2020-09-05 MED ORDER — SODIUM CHLORIDE 0.9 % IV SOLN
Freq: Once | INTRAVENOUS | Status: AC
  Filled 2020-09-05: qty 250

## 2020-09-05 MED ORDER — SODIUM CHLORIDE 0.9% FLUSH
10.0000 mL | Freq: Once | INTRAVENOUS | Status: AC | PRN
  Administered 2020-09-05: 10 mL
  Filled 2020-09-05: qty 10

## 2020-09-05 MED ORDER — HEPARIN SOD (PORK) LOCK FLUSH 100 UNIT/ML IV SOLN
500.0000 [IU] | Freq: Once | INTRAVENOUS | Status: AC | PRN
  Administered 2020-09-05: 500 [IU]
  Filled 2020-09-05: qty 5

## 2020-09-05 NOTE — Progress Notes (Signed)
Nutrition  See telephone note.  Tung Pustejovsky B. Zenia Resides, [place], [place] Registered Dietitian [phone] (mobile)

## 2020-09-05 NOTE — Patient Instructions (Signed)
Rehydration, Adult Rehydration is the replacement of body fluids, salts, and minerals (electrolytes) that are lost during dehydration. Dehydration is when there is not enough water or other fluids in the body. This happens when you lose more fluids than you take in. Common causes of dehydration include:  Not drinking enough fluids. This can occur when you are ill or doing activities that require a lot of energy, especially in hot weather.  Conditions that cause loss of water or other fluids, such as diarrhea, vomiting, sweating, or urinating a lot.  Other illnesses, such as fever or infection.  Certain medicines, such as those that remove excess fluid from the body (diuretics). Symptoms of mild or moderate dehydration may include thirst, dry lips and mouth, and dizziness. Symptoms of severe dehydration may include increased heart rate, confusion, fainting, and not urinating. For severe dehydration, you may need to get fluids through an IV at the hospital. For mild or moderate dehydration, you can usually rehydrate at home by drinking certain fluids as told by your health care provider. What are the risks? Generally, rehydration is safe. However, taking in too much fluid (overhydration) can be a problem. This is rare. Overhydration can cause an electrolyte imbalance, kidney failure, or a decrease in salt (sodium) levels in the body. Supplies needed You will need an oral rehydration solution (ORS) if your health care provider tells you to use one. This is a drink to treat dehydration. It can be found in pharmacies and retail stores. How to rehydrate Fluids Follow instructions from your health care provider for rehydration. The kind of fluid and the amount you should drink depend on your condition. In general, you should choose drinks that you prefer.  If told by your health care provider, drink an ORS. ? Make an ORS by following instructions on the package. ? Start by drinking small amounts,  about  cup (120 mL) every 5-10 minutes. ? Slowly increase how much you drink until you have taken the amount recommended by your health care provider.  Drink enough clear fluids to keep your urine pale yellow. If you were told to drink an ORS, finish it first, then start slowly drinking other clear fluids. Drink fluids such as: ? Water. This includes sparkling water and flavored water. Drinking only water can lead to having too little sodium in your body (hyponatremia). Follow the advice of your health care provider. ? Water from ice chips you suck on. ? Fruit juice with water you add to it (diluted). ? Sports drinks. ? Hot or cold herbal teas. ? Broth-based soups. ? Milk or milk products. Food Follow instructions from your health care provider about what to eat while you rehydrate. Your health care provider may recommend that you slowly begin eating regular foods in small amounts.  Eat foods that contain a healthy balance of electrolytes, such as bananas, oranges, potatoes, tomatoes, and spinach.  Avoid foods that are greasy or contain a lot of sugar. In some cases, you may get nutrition through a feeding tube that is passed through your nose and into your stomach (nasogastric tube, or NG tube). This may be done if you have uncontrolled vomiting or diarrhea.   Beverages to avoid Certain beverages may make dehydration worse. While you rehydrate, avoid drinking alcohol.   How to tell if you are recovering from dehydration You may be recovering from dehydration if:  You are urinating more often than before you started rehydrating.  Your urine is pale yellow.  Your energy level   improves.  You vomit less frequently.  You have diarrhea less frequently.  Your appetite improves or returns to normal.  You feel less dizzy or less light-headed.  Your skin tone and color start to look more normal. Follow these instructions at home:  Take over-the-counter and prescription medicines only  as told by your health care provider.  Do not take sodium tablets. Doing this can lead to having too much sodium in your body (hypernatremia). Contact a health care provider if:  You continue to have symptoms of mild or moderate dehydration, such as: ? Thirst. ? Dry lips. ? Slightly dry mouth. ? Dizziness. ? Dark urine or less urine than normal. ? Muscle cramps.  You continue to vomit or have diarrhea. Get help right away if you:  Have symptoms of dehydration that get worse.  Have a fever.  Have a severe headache.  Have been vomiting and the following happens: ? Your vomiting gets worse or does not go away. ? Your vomit includes blood or green matter (bile). ? You cannot eat or drink without vomiting.  Have problems with urination or bowel movements, such as: ? Diarrhea that gets worse or does not go away. ? Blood in your stool (feces). This may cause stool to look black and tarry. ? Not urinating, or urinating only a small amount of very dark urine, within 6-8 hours.  Have trouble breathing.  Have symptoms that get worse with treatment. These symptoms may represent a serious problem that is an emergency. Do not wait to see if the symptoms will go away. Get medical help right away. Call your local emergency services (911 in the [place] Do not drive yourself to the hospital. Summary  Rehydration is the replacement of body fluids and minerals (electrolytes) that are lost during dehydration.  Follow instructions from your health care provider for rehydration. The kind of fluid and amount you should drink depend on your condition.  Slowly increase how much you drink until you have taken the amount recommended by your health care provider.  Contact your health care provider if you continue to show signs of mild or moderate dehydration. This information is not intended to replace advice given to you by your health care provider. Make sure you discuss any questions you have with  your health care provider. Document Revised: 09/22/2019 Document Reviewed: 08/02/2019 [employer]vier Patient Education  2021 [employer]  

## 2020-09-05 NOTE — Progress Notes (Signed)
I called the patient's daughter today as numerous members of her team had noticed that the patient is having issue with her nutrition, hydration, and remembering appointments.  She has no showed for appointments with IV fluids as well as nutrition.  I told the patient's daughter that the patient will need extra help remembering these things; we are reminding her in clinic but she will need additional reminders at home. I conveyed that her nutritional status is a major issue of concern.  I urged her to call our patient navigator, Anderson Malta, tomorrow, to get further guidelines on following through with regular PEG tube feedings and attending all appointments.  I left her Jennifer's contact information.  -----------------------------------  Eppie Gibson, MD

## 2020-09-05 NOTE — Telephone Encounter (Signed)
Nutrition Follow-up:  Patient with laryngeal cancer.  PEG placed on 07/27/2020.  Patient did not come to nutrition appointment following radiation today. RD called patient via phone and spoke with her.  Patient has not done any tube feeding so far today (1pm when called).  States that she started doing 3 cartons of osmolite 1.5 2 days ago.  Reports that she has not had anything to eat today.  States that she is going to give a tube feeding after we hang up.  Reports that she has been drinking 3 boost per day.  Reports that she lives with daughter and her kids but daughter is at work.  States that Dr Isidore Moos scared her yesterday because she does not want to be admitted to the hospital.    RD called daughter [name] as MD requested speaking with family member during nutrition visit today. Daughter reports that patient has ensure/boost shakes at home and lucky to get her to drink 3 a day.  Daughter has offered to help patient give tube feeding and she refused.  Daughter reports plenty of food options available to patient (soups, yogurt, pudding).  Daughter states that she "can't force her to eat." Daughter understands the importance of nutrition and possibility of hospital admission.    Medications: K-phos (pt states that she is taking this medication but has not taken it today)  Labs: Phosphorus 1.7, Mag 1.2, K 3.8, Na 138  Anthropometrics:   Weight 128 lb 2 oz on 1/31 per [facility] 131 lb on 1/27 135 lb on 1/24   Estimated Energy Needs  Kcals: 0623-7628 Protein: 100-117 g Fluid: < 2 L  NUTRITION DIAGNOSIS: Predicted sub optimal energy intake continues   INTERVENTION:  Discussed importance of nutrition to patient and daughter.   Discussed the importance of continuing to take oral phosphorus for supplementation If patient able to drink 3 boost/ensure plus shakes daily will still need 3 cartons of osmolite 1.5 via tube to better meet nutritional needs.  Patient verbalized understanding.   Patient has contact information    MONITORING, EVALUATION, GOAL: weight trends, intake, tube feeding   NEXT VISIT: Feb 8 following radiation ([place])  Aureliano Oshields B. Zenia Resides, [place], [place] Registered Dietitian [phone] (mobile)

## 2020-09-06 ENCOUNTER — Other Ambulatory Visit: Payer: Self-pay

## 2020-09-06 ENCOUNTER — Ambulatory Visit
Admission: RE | Admit: 2020-09-06 | Discharge: 2020-09-06 | Disposition: A | Discharge status: Home / Self Care | Payer: Medicare Other | Source: Ambulatory Visit | Attending: Radiation Oncology | Admitting: Radiation Oncology

## 2020-09-06 DIAGNOSIS — C329 Malignant neoplasm of larynx, unspecified: Secondary | ICD-10-CM | POA: Diagnosis not present

## 2020-09-06 DIAGNOSIS — C321 Malignant neoplasm of supraglottis: Secondary | ICD-10-CM | POA: Diagnosis not present

## 2020-09-06 DIAGNOSIS — Z95828 Presence of other vascular implants and grafts: Secondary | ICD-10-CM | POA: Diagnosis not present

## 2020-09-07 ENCOUNTER — Ambulatory Visit
Admission: RE | Admit: 2020-09-07 | Discharge: 2020-09-07 | Disposition: A | Discharge status: Home / Self Care | Payer: Medicare Other | Source: Ambulatory Visit | Attending: Radiation Oncology | Admitting: Radiation Oncology

## 2020-09-07 ENCOUNTER — Encounter: Payer: Self-pay | Admitting: *Deleted

## 2020-09-07 ENCOUNTER — Inpatient Hospital Stay: Payer: Medicare Other | Admitting: Hematology and Oncology

## 2020-09-07 ENCOUNTER — Inpatient Hospital Stay: Payer: Medicare Other

## 2020-09-07 ENCOUNTER — Ambulatory Visit: Payer: Medicare Other | Attending: Radiation Oncology

## 2020-09-07 ENCOUNTER — Other Ambulatory Visit: Payer: Self-pay

## 2020-09-07 VITALS — BP 120/65 | HR 99 | Temp 98.5°F | Resp 18 | Wt 127.2 lb

## 2020-09-07 DIAGNOSIS — C329 Malignant neoplasm of larynx, unspecified: Secondary | ICD-10-CM

## 2020-09-07 DIAGNOSIS — C321 Malignant neoplasm of supraglottis: Secondary | ICD-10-CM

## 2020-09-07 DIAGNOSIS — Z95828 Presence of other vascular implants and grafts: Secondary | ICD-10-CM | POA: Diagnosis not present

## 2020-09-07 DIAGNOSIS — E878 Other disorders of electrolyte and fluid balance, not elsewhere classified: Secondary | ICD-10-CM | POA: Diagnosis not present

## 2020-09-07 DIAGNOSIS — F17210 Nicotine dependence, cigarettes, uncomplicated: Secondary | ICD-10-CM | POA: Diagnosis not present

## 2020-09-07 LAB — CBC WITH DIFFERENTIAL/PLATELET
Abs Immature Granulocytes: 0 10*3/uL (ref 0.00–0.07)
Basophils Absolute: 0 10*3/uL (ref 0.0–0.1)
Basophils Relative: 0 %
Eosinophils Absolute: 0 10*3/uL (ref 0.0–0.5)
Eosinophils Relative: 1 %
HCT: 30.6 % — ABNORMAL LOW (ref 36.0–46.0)
Hemoglobin: 10.2 g/dL — ABNORMAL LOW (ref 12.0–15.0)
Immature Granulocytes: 0 %
Lymphocytes Relative: 35 %
Lymphs Abs: 0.3 10*3/uL — ABNORMAL LOW (ref 0.7–4.0)
MCH: 29.8 pg (ref 26.0–34.0)
MCHC: 33.3 g/dL (ref 30.0–36.0)
MCV: 89.5 fL (ref 80.0–100.0)
Monocytes Absolute: 0.1 10*3/uL (ref 0.1–1.0)
Monocytes Relative: 10 %
Neutro Abs: 0.4 10*3/uL — CL (ref 1.7–7.7)
Neutrophils Relative %: 54 %
Platelets: 37 10*3/uL — ABNORMAL LOW (ref 150–400)
RBC: 3.42 MIL/uL — ABNORMAL LOW (ref 3.87–5.11)
RDW: 14.6 % (ref 11.5–15.5)
WBC: 0.7 10*3/uL — CL (ref 4.0–10.5)
nRBC: 0 % (ref 0.0–0.2)

## 2020-09-07 LAB — BASIC METABOLIC PANEL
Anion gap: 8 (ref 5–15)
BUN: 15 mg/dL (ref 8–23)
CO2: 29 mmol/L (ref 22–32)
Calcium: 8.4 mg/dL — ABNORMAL LOW (ref 8.9–10.3)
Chloride: 104 mmol/L (ref 98–111)
Creatinine, Ser: 0.61 mg/dL (ref 0.44–1.00)
GFR, Estimated: >60 mL/min (ref >60)
Glucose, Bld: 123 mg/dL — ABNORMAL HIGH (ref 70–99)
Potassium: 3 mmol/L — ABNORMAL LOW (ref 3.5–5.1)
Sodium: 141 mmol/L (ref 135–145)

## 2020-09-07 LAB — MAGNESIUM: Magnesium: 2.5 mg/dL — ABNORMAL HIGH (ref 1.7–2.4)

## 2020-09-07 MED ORDER — SODIUM CHLORIDE 0.9 % IV SOLN
INTRAVENOUS | Status: DC
  Filled 2020-09-07 (×2): qty 250

## 2020-09-07 MED ORDER — MAGNESIUM SULFATE 4 GM/100ML IV SOLN
4.0000 g | Freq: Once | INTRAVENOUS | Status: AC
  Administered 2020-09-07: 4 g via INTRAVENOUS
  Filled 2020-09-07: qty 100

## 2020-09-07 MED ORDER — SODIUM CHLORIDE 0.9% FLUSH
10.0000 mL | Freq: Once | INTRAVENOUS | Status: AC | PRN
  Administered 2020-09-07: 10 mL
  Filled 2020-09-07: qty 10

## 2020-09-07 MED ORDER — HEPARIN SOD (PORK) LOCK FLUSH 100 UNIT/ML IV SOLN
500.0000 [IU] | Freq: Once | INTRAVENOUS | Status: AC | PRN
  Administered 2020-09-07: 500 [IU]
  Filled 2020-09-07: qty 5

## 2020-09-07 NOTE — Progress Notes (Signed)
CRITICAL VALUE STICKER  CRITICAL VALUE: WBC 0.7   RECEIVER (on-site recipient of call):Vesta Wheeland,RN  DATE & TIME NOTIFIED: 09/07/20 at 1334  MESSENGER (representative from lab): Lelan Pons  MD NOTIFIED: Dr. Chryl Heck  TIME OF NOTIFICATION: 1340  RESPONSE:MD seeing patient  CRITICAL VALUE STICKER  CRITICAL VALUE: ANC 0.4  RECEIVER (on-site recipient of call): Manuela Schwartz, [place] NOTIFIED: 09/07/20 at 1400  MESSENGER (representative from lab): Lelan Pons  MD NOTIFIED: Dr. Chryl Heck  TIME OF NOTIFICATION: 1405  RESPONSE: Seeing patient in office currently

## 2020-09-08 ENCOUNTER — Telehealth: Payer: Self-pay

## 2020-09-08 ENCOUNTER — Ambulatory Visit
Admission: RE | Admit: 2020-09-08 | Discharge: 2020-09-08 | Disposition: A | Discharge status: Home / Self Care | Payer: Medicare Other | Source: Ambulatory Visit | Attending: Radiation Oncology | Admitting: Radiation Oncology

## 2020-09-08 ENCOUNTER — Ambulatory Visit: Payer: Medicare Other | Admitting: Hematology and Oncology

## 2020-09-08 ENCOUNTER — Encounter: Payer: Medicare Other | Admitting: Medical

## 2020-09-08 ENCOUNTER — Other Ambulatory Visit: Payer: Self-pay

## 2020-09-08 ENCOUNTER — Other Ambulatory Visit: Payer: Medicare Other

## 2020-09-08 ENCOUNTER — Ambulatory Visit: Payer: Medicare Other

## 2020-09-08 DIAGNOSIS — Z95828 Presence of other vascular implants and grafts: Secondary | ICD-10-CM | POA: Diagnosis not present

## 2020-09-08 DIAGNOSIS — C329 Malignant neoplasm of larynx, unspecified: Secondary | ICD-10-CM | POA: Diagnosis not present

## 2020-09-08 DIAGNOSIS — C321 Malignant neoplasm of supraglottis: Secondary | ICD-10-CM | POA: Diagnosis not present

## 2020-09-08 NOTE — Telephone Encounter (Signed)
-----   Message from Benay Pike, MD sent at 09/07/2020  5:25 PM EST ----- Actually Raven Torres, just checking her labs., WBC too low to proceed with treatment, so are the platelets Cancel treatment tomorrow. FU next week

## 2020-09-08 NOTE — Telephone Encounter (Signed)
Opened in error

## 2020-09-08 NOTE — Telephone Encounter (Signed)
Crystal, the patient's daughter, has been made aware of her lab results and verbalized understanding that the patient wouldn't be receiving Cisplatin today and is due to follow up with Dr. Chryl Heck next week- a scheduling message was sent to arrange this appointment.   During my phone call with Crystal she shared with me that her 67 year old son has a cold and Karna lives with them. With the patient's WBC count being so low I advised the daughter to keep the patient away from the 67 year old. Crystal states that Hazelene is not showing any signs or symptoms of a cold at this point in time. Dr. Chryl Heck has been made aware of this.  Crystal also wanted me to ask Dr. 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 Dr. Chryl Heck states that at this time she was not worried about TB reactivation since she is asymptomatic and that we could work her up for it if she shows any symptoms. I advised Crystal to contact us if the patient develops fever, feelings of sickness/weakness, flushing, coughing, or chest pain. Crystal verbalized understanding.

## 2020-09-09 ENCOUNTER — Other Ambulatory Visit: Payer: Self-pay

## 2020-09-09 ENCOUNTER — Inpatient Hospital Stay: Payer: Medicare Other

## 2020-09-09 VITALS — BP 116/57 | HR 85 | Temp 98.8°F | Resp 17

## 2020-09-09 DIAGNOSIS — Z66 Do not resuscitate: Secondary | ICD-10-CM | POA: Diagnosis not present

## 2020-09-09 DIAGNOSIS — J440 Chronic obstructive pulmonary disease with acute lower respiratory infection: Secondary | ICD-10-CM | POA: Diagnosis not present

## 2020-09-09 DIAGNOSIS — Z6820 Body mass index (BMI) 20.0-20.9, adult: Secondary | ICD-10-CM | POA: Diagnosis not present

## 2020-09-09 DIAGNOSIS — J9601 Acute respiratory failure with hypoxia: Secondary | ICD-10-CM | POA: Diagnosis not present

## 2020-09-09 DIAGNOSIS — D61810 Antineoplastic chemotherapy induced pancytopenia: Secondary | ICD-10-CM | POA: Diagnosis not present

## 2020-09-09 DIAGNOSIS — I10 Essential (primary) hypertension: Secondary | ICD-10-CM | POA: Diagnosis not present

## 2020-09-09 DIAGNOSIS — Z8249 Family history of ischemic heart disease and other diseases of the circulatory system: Secondary | ICD-10-CM | POA: Diagnosis not present

## 2020-09-09 DIAGNOSIS — I951 Orthostatic hypotension: Secondary | ICD-10-CM | POA: Diagnosis not present

## 2020-09-09 DIAGNOSIS — E861 Hypovolemia: Secondary | ICD-10-CM | POA: Diagnosis not present

## 2020-09-09 DIAGNOSIS — R0602 Shortness of breath: Secondary | ICD-10-CM | POA: Diagnosis not present

## 2020-09-09 DIAGNOSIS — C329 Malignant neoplasm of larynx, unspecified: Secondary | ICD-10-CM | POA: Diagnosis not present

## 2020-09-09 DIAGNOSIS — E876 Hypokalemia: Secondary | ICD-10-CM | POA: Diagnosis not present

## 2020-09-09 DIAGNOSIS — J1282 Pneumonia due to coronavirus disease 2019: Secondary | ICD-10-CM | POA: Diagnosis not present

## 2020-09-09 DIAGNOSIS — E785 Hyperlipidemia, unspecified: Secondary | ICD-10-CM | POA: Diagnosis not present

## 2020-09-09 DIAGNOSIS — Z9221 Personal history of antineoplastic chemotherapy: Secondary | ICD-10-CM | POA: Diagnosis not present

## 2020-09-09 DIAGNOSIS — M069 Rheumatoid arthritis, unspecified: Secondary | ICD-10-CM | POA: Diagnosis not present

## 2020-09-09 DIAGNOSIS — C321 Malignant neoplasm of supraglottis: Secondary | ICD-10-CM | POA: Diagnosis not present

## 2020-09-09 DIAGNOSIS — U071 COVID-19: Secondary | ICD-10-CM | POA: Diagnosis not present

## 2020-09-09 DIAGNOSIS — I9589 Other hypotension: Secondary | ICD-10-CM | POA: Diagnosis not present

## 2020-09-09 DIAGNOSIS — K7469 Other cirrhosis of liver: Secondary | ICD-10-CM | POA: Diagnosis not present

## 2020-09-09 DIAGNOSIS — I959 Hypotension, unspecified: Secondary | ICD-10-CM | POA: Diagnosis not present

## 2020-09-09 DIAGNOSIS — J439 Emphysema, unspecified: Secondary | ICD-10-CM | POA: Diagnosis not present

## 2020-09-09 DIAGNOSIS — Z923 Personal history of irradiation: Secondary | ICD-10-CM | POA: Diagnosis not present

## 2020-09-09 DIAGNOSIS — R627 Adult failure to thrive: Secondary | ICD-10-CM | POA: Diagnosis not present

## 2020-09-09 DIAGNOSIS — E43 Unspecified severe protein-calorie malnutrition: Secondary | ICD-10-CM | POA: Diagnosis not present

## 2020-09-09 MED ORDER — SODIUM CHLORIDE 0.9 % IV SOLN
Freq: Once | INTRAVENOUS | Status: AC
  Filled 2020-09-09: qty 250

## 2020-09-09 MED ORDER — HEPARIN SOD (PORK) LOCK FLUSH 100 UNIT/ML IV SOLN
500.0000 [IU] | Freq: Once | INTRAVENOUS | Status: AC | PRN
  Administered 2020-09-09: 500 [IU]
  Filled 2020-09-09: qty 5

## 2020-09-09 MED ORDER — SODIUM CHLORIDE 0.9% FLUSH
10.0000 mL | Freq: Once | INTRAVENOUS | Status: AC | PRN
  Administered 2020-09-09: 10 mL
  Filled 2020-09-09: qty 10

## 2020-09-09 MED ORDER — MAGNESIUM SULFATE 4 GM/100ML IV SOLN: 4.0000 g | Freq: Once | INTRAVENOUS | Status: DC

## 2020-09-09 NOTE — Patient Instructions (Signed)
Rehydration, Adult Rehydration is the replacement of body fluids, salts, and minerals (electrolytes) that are lost during dehydration. Dehydration is when there is not enough water or other fluids in the body. This happens when you lose more fluids than you take in. Common causes of dehydration include:  Not drinking enough fluids. This can occur when you are ill or doing activities that require a lot of energy, especially in hot weather.  Conditions that cause loss of water or other fluids, such as diarrhea, vomiting, sweating, or urinating a lot.  Other illnesses, such as fever or infection.  Certain medicines, such as those that remove excess fluid from the body (diuretics). Symptoms of mild or moderate dehydration may include thirst, dry lips and mouth, and dizziness. Symptoms of severe dehydration may include increased heart rate, confusion, fainting, and not urinating. For severe dehydration, you may need to get fluids through an IV at the hospital. For mild or moderate dehydration, you can usually rehydrate at home by drinking certain fluids as told by your health care provider. What are the risks? Generally, rehydration is safe. However, taking in too much fluid (overhydration) can be a problem. This is rare. Overhydration can cause an electrolyte imbalance, kidney failure, or a decrease in salt (sodium) levels in the body. Supplies needed You will need an oral rehydration solution (ORS) if your health care provider tells you to use one. This is a drink to treat dehydration. It can be found in pharmacies and retail stores. How to rehydrate Fluids Follow instructions from your health care provider for rehydration. The kind of fluid and the amount you should drink depend on your condition. In general, you should choose drinks that you prefer.  If told by your health care provider, drink an ORS. ? Make an ORS by following instructions on the package. ? Start by drinking small amounts,  about  cup (120 mL) every 5-10 minutes. ? Slowly increase how much you drink until you have taken the amount recommended by your health care provider.  Drink enough clear fluids to keep your urine pale yellow. If you were told to drink an ORS, finish it first, then start slowly drinking other clear fluids. Drink fluids such as: ? Water. This includes sparkling water and flavored water. Drinking only water can lead to having too little sodium in your body (hyponatremia). Follow the advice of your health care provider. ? Water from ice chips you suck on. ? Fruit juice with water you add to it (diluted). ? Sports drinks. ? Hot or cold herbal teas. ? Broth-based soups. ? Milk or milk products. Food Follow instructions from your health care provider about what to eat while you rehydrate. Your health care provider may recommend that you slowly begin eating regular foods in small amounts.  Eat foods that contain a healthy balance of electrolytes, such as bananas, oranges, potatoes, tomatoes, and spinach.  Avoid foods that are greasy or contain a lot of sugar. In some cases, you may get nutrition through a feeding tube that is passed through your nose and into your stomach (nasogastric tube, or NG tube). This may be done if you have uncontrolled vomiting or diarrhea.   Beverages to avoid Certain beverages may make dehydration worse. While you rehydrate, avoid drinking alcohol.   How to tell if you are recovering from dehydration You may be recovering from dehydration if:  You are urinating more often than before you started rehydrating.  Your urine is pale yellow.  Your energy level   improves.  You vomit less frequently.  You have diarrhea less frequently.  Your appetite improves or returns to normal.  You feel less dizzy or less light-headed.  Your skin tone and color start to look more normal. Follow these instructions at home:  Take over-the-counter and prescription medicines only  as told by your health care provider.  Do not take sodium tablets. Doing this can lead to having too much sodium in your body (hypernatremia). Contact a health care provider if:  You continue to have symptoms of mild or moderate dehydration, such as: ? Thirst. ? Dry lips. ? Slightly dry mouth. ? Dizziness. ? Dark urine or less urine than normal. ? Muscle cramps.  You continue to vomit or have diarrhea. Get help right away if you:  Have symptoms of dehydration that get worse.  Have a fever.  Have a severe headache.  Have been vomiting and the following happens: ? Your vomiting gets worse or does not go away. ? Your vomit includes blood or green matter (bile). ? You cannot eat or drink without vomiting.  Have problems with urination or bowel movements, such as: ? Diarrhea that gets worse or does not go away. ? Blood in your stool (feces). This may cause stool to look black and tarry. ? Not urinating, or urinating only a small amount of very dark urine, within 6-8 hours.  Have trouble breathing.  Have symptoms that get worse with treatment. These symptoms may represent a serious problem that is an emergency. Do not wait to see if the symptoms will go away. Get medical help right away. Call your local emergency services (911 in the [place] Do not drive yourself to the hospital. Summary  Rehydration is the replacement of body fluids and minerals (electrolytes) that are lost during dehydration.  Follow instructions from your health care provider for rehydration. The kind of fluid and amount you should drink depend on your condition.  Slowly increase how much you drink until you have taken the amount recommended by your health care provider.  Contact your health care provider if you continue to show signs of mild or moderate dehydration. This information is not intended to replace advice given to you by your health care provider. Make sure you discuss any questions you have with  your health care provider. Document Revised: 09/22/2019 Document Reviewed: 08/02/2019 [employer]vier Patient Education  2021 [employer]  

## 2020-09-11 ENCOUNTER — Ambulatory Visit: Payer: Medicare Other

## 2020-09-11 NOTE — Progress Notes (Signed)
Oncology Nurse Navigator Documentation  Raven Torres did not come for her scheduled IVF's or her radiation appointment today. I have called and left a voice mail message on her phone and her daughter, Raven Torres's phone asking for a return call to check on her.  I provided them with my direct contact information.  Harlow Asa RN, BSN, OCN Head & Neck Oncology Nurse [place] at [facility] Phone # [phone]  Fax # [phone]

## 2020-09-12 ENCOUNTER — Inpatient Hospital Stay: Payer: Medicare Other | Admitting: Nutrition

## 2020-09-12 ENCOUNTER — Ambulatory Visit
Admission: RE | Admit: 2020-09-12 | Discharge: 2020-09-12 | Disposition: A | Discharge status: Home / Self Care | Payer: Medicare Other | Source: Ambulatory Visit | Attending: Radiation Oncology | Admitting: Radiation Oncology

## 2020-09-12 ENCOUNTER — Inpatient Hospital Stay (HOSPITAL_COMMUNITY)
Admission: EM | Admit: 2020-09-12 | Discharge: 2020-09-16 | DRG: 312 | Disposition: A | Discharge status: Home / Self Care | Payer: Medicare Other | Source: Ambulatory Visit | Attending: Internal Medicine | Admitting: Internal Medicine

## 2020-09-12 ENCOUNTER — Encounter (HOSPITAL_COMMUNITY): Payer: Self-pay

## 2020-09-12 ENCOUNTER — Ambulatory Visit: Payer: Medicare Other

## 2020-09-12 ENCOUNTER — Other Ambulatory Visit: Payer: Self-pay

## 2020-09-12 ENCOUNTER — Emergency Department (HOSPITAL_COMMUNITY): Payer: Medicare Other

## 2020-09-12 DIAGNOSIS — T451X5A Adverse effect of antineoplastic and immunosuppressive drugs, initial encounter: Secondary | ICD-10-CM | POA: Diagnosis present

## 2020-09-12 DIAGNOSIS — Z8249 Family history of ischemic heart disease and other diseases of the circulatory system: Secondary | ICD-10-CM

## 2020-09-12 DIAGNOSIS — C321 Malignant neoplasm of supraglottis: Secondary | ICD-10-CM | POA: Diagnosis not present

## 2020-09-12 DIAGNOSIS — B182 Chronic viral hepatitis C: Secondary | ICD-10-CM | POA: Diagnosis present

## 2020-09-12 DIAGNOSIS — J439 Emphysema, unspecified: Secondary | ICD-10-CM | POA: Diagnosis not present

## 2020-09-12 DIAGNOSIS — I9589 Other hypotension: Secondary | ICD-10-CM

## 2020-09-12 DIAGNOSIS — D61810 Antineoplastic chemotherapy induced pancytopenia: Secondary | ICD-10-CM | POA: Diagnosis present

## 2020-09-12 DIAGNOSIS — E861 Hypovolemia: Secondary | ICD-10-CM

## 2020-09-12 DIAGNOSIS — Z931 Gastrostomy status: Secondary | ICD-10-CM

## 2020-09-12 DIAGNOSIS — R0602 Shortness of breath: Secondary | ICD-10-CM | POA: Diagnosis not present

## 2020-09-12 DIAGNOSIS — E43 Unspecified severe protein-calorie malnutrition: Secondary | ICD-10-CM | POA: Diagnosis not present

## 2020-09-12 DIAGNOSIS — Z888 Allergy status to other drugs, medicaments and biological substances status: Secondary | ICD-10-CM

## 2020-09-12 DIAGNOSIS — I959 Hypotension, unspecified: Secondary | ICD-10-CM | POA: Diagnosis present

## 2020-09-12 DIAGNOSIS — Z885 Allergy status to narcotic agent status: Secondary | ICD-10-CM

## 2020-09-12 DIAGNOSIS — E876 Hypokalemia: Secondary | ICD-10-CM | POA: Diagnosis present

## 2020-09-12 DIAGNOSIS — J189 Pneumonia, unspecified organism: Secondary | ICD-10-CM

## 2020-09-12 DIAGNOSIS — Z9981 Dependence on supplemental oxygen: Secondary | ICD-10-CM

## 2020-09-12 DIAGNOSIS — Z8615 Personal history of latent tuberculosis infection: Secondary | ICD-10-CM | POA: Diagnosis not present

## 2020-09-12 DIAGNOSIS — R627 Adult failure to thrive: Secondary | ICD-10-CM | POA: Diagnosis present

## 2020-09-12 DIAGNOSIS — R42 Dizziness and giddiness: Secondary | ICD-10-CM | POA: Diagnosis present

## 2020-09-12 DIAGNOSIS — C329 Malignant neoplasm of larynx, unspecified: Secondary | ICD-10-CM | POA: Diagnosis present

## 2020-09-12 DIAGNOSIS — Z800 Family history of malignant neoplasm of digestive organs: Secondary | ICD-10-CM

## 2020-09-12 DIAGNOSIS — M069 Rheumatoid arthritis, unspecified: Secondary | ICD-10-CM | POA: Diagnosis not present

## 2020-09-12 DIAGNOSIS — U071 COVID-19: Secondary | ICD-10-CM | POA: Diagnosis not present

## 2020-09-12 DIAGNOSIS — J1282 Pneumonia due to coronavirus disease 2019: Secondary | ICD-10-CM | POA: Diagnosis present

## 2020-09-12 DIAGNOSIS — Z66 Do not resuscitate: Secondary | ICD-10-CM | POA: Diagnosis present

## 2020-09-12 DIAGNOSIS — Z9221 Personal history of antineoplastic chemotherapy: Secondary | ICD-10-CM

## 2020-09-12 DIAGNOSIS — K7469 Other cirrhosis of liver: Secondary | ICD-10-CM | POA: Diagnosis present

## 2020-09-12 DIAGNOSIS — I951 Orthostatic hypotension: Principal | ICD-10-CM | POA: Diagnosis present

## 2020-09-12 DIAGNOSIS — J154 Pneumonia due to other streptococci: Secondary | ICD-10-CM | POA: Diagnosis present

## 2020-09-12 DIAGNOSIS — Z6820 Body mass index (BMI) 20.0-20.9, adult: Secondary | ICD-10-CM | POA: Diagnosis not present

## 2020-09-12 DIAGNOSIS — M1990 Unspecified osteoarthritis, unspecified site: Secondary | ICD-10-CM | POA: Diagnosis present

## 2020-09-12 DIAGNOSIS — Z923 Personal history of irradiation: Secondary | ICD-10-CM

## 2020-09-12 DIAGNOSIS — J9601 Acute respiratory failure with hypoxia: Secondary | ICD-10-CM | POA: Diagnosis present

## 2020-09-12 DIAGNOSIS — J440 Chronic obstructive pulmonary disease with acute lower respiratory infection: Secondary | ICD-10-CM | POA: Diagnosis present

## 2020-09-12 DIAGNOSIS — Z808 Family history of malignant neoplasm of other organs or systems: Secondary | ICD-10-CM

## 2020-09-12 DIAGNOSIS — E785 Hyperlipidemia, unspecified: Secondary | ICD-10-CM | POA: Diagnosis present

## 2020-09-12 DIAGNOSIS — Z886 Allergy status to analgesic agent status: Secondary | ICD-10-CM

## 2020-09-12 DIAGNOSIS — F17210 Nicotine dependence, cigarettes, uncomplicated: Secondary | ICD-10-CM | POA: Diagnosis present

## 2020-09-12 DIAGNOSIS — I10 Essential (primary) hypertension: Secondary | ICD-10-CM | POA: Diagnosis not present

## 2020-09-12 LAB — CBC WITH DIFFERENTIAL/PLATELET
Abs Immature Granulocytes: 0.04 10*3/uL (ref 0.00–0.07)
Basophils Absolute: 0 10*3/uL (ref 0.0–0.1)
Basophils Relative: 1 %
Eosinophils Absolute: 0 10*3/uL (ref 0.0–0.5)
Eosinophils Relative: 1 %
HCT: 27.1 % — ABNORMAL LOW (ref 36.0–46.0)
Hemoglobin: 9.6 g/dL — ABNORMAL LOW (ref 12.0–15.0)
Immature Granulocytes: 3 %
Lymphocytes Relative: 21 %
Lymphs Abs: 0.3 10*3/uL — ABNORMAL LOW (ref 0.7–4.0)
MCH: 32.7 pg (ref 26.0–34.0)
MCHC: 35.4 g/dL (ref 30.0–36.0)
MCV: 92.2 fL (ref 80.0–100.0)
Monocytes Absolute: 0.5 10*3/uL (ref 0.1–1.0)
Monocytes Relative: 34 %
Neutro Abs: 0.6 10*3/uL — ABNORMAL LOW (ref 1.7–7.7)
Neutrophils Relative %: 40 %
Platelets: 99 10*3/uL — ABNORMAL LOW (ref 150–400)
RBC: 2.94 MIL/uL — ABNORMAL LOW (ref 3.87–5.11)
RDW: 16.2 % — ABNORMAL HIGH (ref 11.5–15.5)
WBC: 1.5 10*3/uL — ABNORMAL LOW (ref 4.0–10.5)
nRBC: 1.4 % — ABNORMAL HIGH (ref 0.0–0.2)

## 2020-09-12 LAB — URINALYSIS, ROUTINE W REFLEX MICROSCOPIC
Bilirubin Urine: NEGATIVE
Glucose, UA: NEGATIVE mg/dL
Hgb urine dipstick: NEGATIVE
Ketones, ur: 20 mg/dL — AB
Leukocytes,Ua: NEGATIVE
Nitrite: NEGATIVE
Protein, ur: 30 mg/dL — AB
Specific Gravity, Urine: 1.028 (ref 1.005–1.030)
pH: 5 (ref 5.0–8.0)

## 2020-09-12 LAB — COMPREHENSIVE METABOLIC PANEL
ALT: 13 U/L (ref 0–44)
AST: 22 U/L (ref 15–41)
Albumin: 2.5 g/dL — ABNORMAL LOW (ref 3.5–5.0)
Alkaline Phosphatase: 45 U/L (ref 38–126)
Anion gap: 12 (ref 5–15)
BUN: 19 mg/dL (ref 8–23)
CO2: 24 mmol/L (ref 22–32)
Calcium: 8.3 mg/dL — ABNORMAL LOW (ref 8.9–10.3)
Chloride: 103 mmol/L (ref 98–111)
Creatinine, Ser: 0.49 mg/dL (ref 0.44–1.00)
GFR, Estimated: >60 mL/min (ref >60)
Glucose, Bld: 90 mg/dL (ref 70–99)
Potassium: 3.4 mmol/L — ABNORMAL LOW (ref 3.5–5.1)
Sodium: 139 mmol/L (ref 135–145)
Total Bilirubin: 1 mg/dL (ref 0.3–1.2)
Total Protein: 6 g/dL — ABNORMAL LOW (ref 6.5–8.1)

## 2020-09-12 LAB — RESP PANEL BY RT-PCR (FLU A&B, COVID) ARPGX2
Influenza A by PCR: NEGATIVE
Influenza B by PCR: NEGATIVE
SARS Coronavirus 2 by RT PCR: POSITIVE — AB

## 2020-09-12 LAB — MAGNESIUM: Magnesium: 1.4 mg/dL — ABNORMAL LOW (ref 1.7–2.4)

## 2020-09-12 LAB — LACTIC ACID, PLASMA: Lactic Acid, Venous: 0.8 mmol/L (ref 0.5–1.9)

## 2020-09-12 MED ORDER — SODIUM CHLORIDE 0.9 % IV SOLN
2.0000 g | Freq: Three times a day (TID) | INTRAVENOUS | Status: DC
  Administered 2020-09-13 – 2020-09-16 (×11): 2 g via INTRAVENOUS
  Filled 2020-09-12 (×13): qty 2

## 2020-09-12 MED ORDER — K PHOS MONO-SOD PHOS DI & MONO 155-852-130 MG PO TABS
2.0000 | ORAL_TABLET | Freq: Two times a day (BID) | ORAL | Status: DC
  Administered 2020-09-12 – 2020-09-14 (×5): 2
  Filled 2020-09-12 (×5): qty 2

## 2020-09-12 MED ORDER — ENOXAPARIN SODIUM 40 MG/0.4ML ~~LOC~~ SOLN
40.0000 mg | SUBCUTANEOUS | Status: DC
  Administered 2020-09-13 – 2020-09-16 (×4): 40 mg via SUBCUTANEOUS
  Filled 2020-09-12 (×4): qty 0.4

## 2020-09-12 MED ORDER — ONDANSETRON HCL 4 MG PO TABS: 4.0000 mg | ORAL_TABLET | Freq: Four times a day (QID) | ORAL | Status: DC | PRN

## 2020-09-12 MED ORDER — MAGNESIUM SULFATE 50 % IJ SOLN: 1.0000 g | Freq: Once | INTRAMUSCULAR | Status: DC

## 2020-09-12 MED ORDER — SODIUM CHLORIDE 0.9 % IV SOLN
2.0000 g | Freq: Once | INTRAVENOUS | Status: AC
  Administered 2020-09-12: 2 g via INTRAVENOUS
  Filled 2020-09-12: qty 2

## 2020-09-12 MED ORDER — SODIUM CHLORIDE 0.9 % IV BOLUS
1000.0000 mL | Freq: Once | INTRAVENOUS | Status: AC
  Administered 2020-09-12: 1000 mL via INTRAVENOUS

## 2020-09-12 MED ORDER — ALBUTEROL SULFATE HFA 108 (90 BASE) MCG/ACT IN AERS: 2.0000 | INHALATION_SPRAY | Freq: Four times a day (QID) | RESPIRATORY_TRACT | Status: DC | PRN

## 2020-09-12 MED ORDER — MAGNESIUM SULFATE IN D5W 1-5 GM/100ML-% IV SOLN
1.0000 g | Freq: Once | INTRAVENOUS | Status: DC
  Filled 2020-09-12: qty 100

## 2020-09-12 MED ORDER — OSMOLITE 1.5 CAL PO LIQD
237.0000 mL | Freq: Four times a day (QID) | ORAL | Status: DC
  Administered 2020-09-13 – 2020-09-14 (×5): 237 mL
  Filled 2020-09-12 (×15): qty 237

## 2020-09-12 MED ORDER — DEXAMETHASONE 4 MG PO TABS: 8.0000 mg | ORAL_TABLET | Freq: Every day | ORAL | Status: DC

## 2020-09-12 MED ORDER — MAGNESIUM SULFATE 4 GM/100ML IV SOLN
4.0000 g | Freq: Once | INTRAVENOUS | Status: AC
  Administered 2020-09-12: 4 g via INTRAVENOUS
  Filled 2020-09-12: qty 100

## 2020-09-12 MED ORDER — ONDANSETRON HCL 4 MG/2ML IJ SOLN: 4.0000 mg | Freq: Four times a day (QID) | INTRAMUSCULAR | Status: DC | PRN

## 2020-09-12 MED ORDER — BOOST PO LIQD
237.0000 mL | Freq: Three times a day (TID) | ORAL | Status: DC
  Filled 2020-09-12 (×2): qty 237

## 2020-09-12 MED ORDER — VANCOMYCIN HCL 1250 MG/250ML IV SOLN
1250.0000 mg | Freq: Once | INTRAVENOUS | Status: AC
  Administered 2020-09-12: 1250 mg via INTRAVENOUS
  Filled 2020-09-12: qty 250

## 2020-09-12 MED ORDER — GUAIFENESIN-DM 100-10 MG/5ML PO SYRP
10.0000 mL | ORAL_SOLUTION | ORAL | Status: DC | PRN
  Filled 2020-09-12: qty 10

## 2020-09-12 MED ORDER — ACETAMINOPHEN 325 MG PO TABS: 650.0000 mg | ORAL_TABLET | Freq: Four times a day (QID) | ORAL | Status: DC | PRN

## 2020-09-12 MED ORDER — CHLORHEXIDINE GLUCONATE CLOTH 2 % EX PADS
6.0000 | MEDICATED_PAD | Freq: Every day | CUTANEOUS | Status: DC
  Administered 2020-09-13 – 2020-09-16 (×4): 6 via TOPICAL

## 2020-09-12 MED ORDER — LIDOCAINE VISCOUS HCL 2 % MT SOLN
5.0000 mL | Freq: Four times a day (QID) | OROMUCOSAL | Status: DC | PRN
  Administered 2020-09-14: 5 mL via OROMUCOSAL
  Filled 2020-09-12: qty 15

## 2020-09-12 MED ORDER — PROCHLORPERAZINE MALEATE 10 MG PO TABS: 10.0000 mg | ORAL_TABLET | Freq: Four times a day (QID) | ORAL | Status: DC | PRN

## 2020-09-12 MED ORDER — LIDOCAINE VISCOUS HCL 2 % MT SOLN: 15.0000 mL | Freq: Four times a day (QID) | OROMUCOSAL | Status: DC | PRN

## 2020-09-12 MED ORDER — BOOST / RESOURCE BREEZE PO LIQD CUSTOM
1.0000 | Freq: Three times a day (TID) | ORAL | Status: DC
  Administered 2020-09-13: 1 via ORAL
  Filled 2020-09-12: qty 1

## 2020-09-12 MED ORDER — PREDNISONE 50 MG PO TABS
50.0000 mg | ORAL_TABLET | Freq: Every day | ORAL | Status: DC
  Administered 2020-09-16: 50 mg via ORAL
  Filled 2020-09-12: qty 1

## 2020-09-12 MED ORDER — SODIUM CHLORIDE 0.9 % IV SOLN
200.0000 mg | Freq: Once | INTRAVENOUS | Status: AC
  Administered 2020-09-12: 200 mg via INTRAVENOUS
  Filled 2020-09-12: qty 200

## 2020-09-12 MED ORDER — KCL IN DEXTROSE-NACL 20-5-0.45 MEQ/L-%-% IV SOLN
INTRAVENOUS | Status: DC
  Filled 2020-09-12 (×4): qty 1000

## 2020-09-12 MED ORDER — FREE WATER
120.0000 mL | Freq: Four times a day (QID) | Status: DC
  Administered 2020-09-13 – 2020-09-16 (×11): 120 mL

## 2020-09-12 MED ORDER — ALBUTEROL SULFATE (2.5 MG/3ML) 0.083% IN NEBU: 2.5000 mg | INHALATION_SOLUTION | Freq: Four times a day (QID) | RESPIRATORY_TRACT | Status: DC | PRN

## 2020-09-12 MED ORDER — ACETAMINOPHEN 650 MG RE SUPP: 650.0000 mg | Freq: Four times a day (QID) | RECTAL | Status: DC | PRN

## 2020-09-12 MED ORDER — SODIUM CHLORIDE 0.9 % IV SOLN
100.0000 mg | Freq: Every day | INTRAVENOUS | Status: AC
  Administered 2020-09-13 – 2020-09-16 (×4): 100 mg via INTRAVENOUS
  Filled 2020-09-12 (×4): qty 20

## 2020-09-12 MED ORDER — VANCOMYCIN HCL 500 MG/100ML IV SOLN
500.0000 mg | Freq: Two times a day (BID) | INTRAVENOUS | Status: DC
  Administered 2020-09-13 – 2020-09-14 (×3): 500 mg via INTRAVENOUS
  Filled 2020-09-12 (×4): qty 100

## 2020-09-12 MED ORDER — SODIUM CHLORIDE 0.9% FLUSH: 10.0000 mL | INTRAVENOUS | Status: DC | PRN

## 2020-09-12 MED ORDER — ALBUTEROL SULFATE HFA 108 (90 BASE) MCG/ACT IN AERS
2.0000 | INHALATION_SPRAY | Freq: Four times a day (QID) | RESPIRATORY_TRACT | Status: DC
  Administered 2020-09-12 – 2020-09-13 (×5): 2 via RESPIRATORY_TRACT
  Filled 2020-09-12: qty 6.7

## 2020-09-12 MED ORDER — METHYLPREDNISOLONE SODIUM SUCC 40 MG IJ SOLR
0.5000 mg/kg | Freq: Two times a day (BID) | INTRAMUSCULAR | Status: AC
  Administered 2020-09-12 – 2020-09-15 (×6): 28.8 mg via INTRAVENOUS
  Filled 2020-09-12 (×6): qty 1

## 2020-09-12 NOTE — Progress Notes (Signed)
Received patient from ED at 2158. Pt is AAOX4. Denies pain or discomfort at this time.Oriented to room and call bell placed within reach.

## 2020-09-12 NOTE — Progress Notes (Signed)
A consult was received from an ED physician for vancomycin per pharmacy dosing.  The patient's profile has been reviewed for ht/wt/allergies/indication/available labs.   A one time order has been placed for vancomycin 1250 mg IV x 1 dose.    Further antibiotics/pharmacy consults should be ordered by admitting physician if indicated.                       Thank you, Eudelia Bunch, Pharm.D 09/12/2020 4:18 PM

## 2020-09-12 NOTE — ED Provider Notes (Signed)
[facility] DEPT Provider Note   CSN: [id number] Arrival date & time: 09/12/20  1406     History Chief Complaint  Patient presents with  . Failure To Thrive    Raven Torres is a 65 y.o. 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  Per to review, patient has completed a couple weeks of chemotherapy, started radiation 12/21. Per radiation oncology nurse, they state that she was hypotensive to 65/50 supine.  Did have positive orthostatics, when she was laying her blood pressure was 103/55.  Patient currently says that she feels fine playing, she was started on IV fluids and her blood pressure did increase to 139/58 supine.  Patient states that she does have some shortness of breath, this is not uncommon for her due to her COPD, however patient states that shortness of breath has worsened today.  Does not wear O2 at home. Denies any chest pain.  Denies any fevers, chills, nausea, vomiting, dizziness.  Patient does admit to some diarrhea recently.  Has been vaccinated against COVID.  Denies any abdominal pain.  Denies any headache, myalgias, numbness or tingling, or other complaints at this time.  Per chart review patient has been having problems with her nutrition and hydration for a while now, has been having problems with IV fluids according to patient's radiation oncologist, Dr. Isidore Moos.  Patient states that she has not been using her PEG tube as much as she should, states that she may be using it 1-2 times a day, they did recommend her using it 4 times a day.  She states that she just does not feel like using it.  Patient states that she has been having low p.o. intake because she does have ulcers in her mouth from the radiation,  states that she maybe drinks 1 Ensure a day, does try to eat few things, however often does not eat.  Patient has lost about 30 pounds since starting radiation.  Per chart review, patient has had hypotension in the past, 76/52 at patient's last oncology appointment.   HPI     Past Medical History:  Diagnosis Date  . Arthritis   . Cataract    removed both eyes   . Colitis   . COPD (chronic obstructive pulmonary disease) ([place])   . Hepatitis C   . Hyperlipidemia   . Hypertension   . Shingles 03/27/2020  . TB (tuberculosis)    treatment     Patient Active Problem List   Diagnosis Date Noted  . Hypotension 09/12/2020  . Tobacco dependence 08/11/2020  . Malignant neoplasm of supraglottis ([place]) 07/13/2020  . Herpes zoster without complication 02/40/9735  . GOLD COPD III B 04/23/2018  . Pruritic dermatitis 04/23/2018  . High cholesterol 06/24/2017  . Shortness of breath 06/04/2017  . Coronary artery calcification 06/04/2017  . Other fatigue 06/04/2017  . Centrilobular emphysema ([place]) 05/15/2017  . Atherosclerosis of arteries 05/15/2017  . Poor memory 05/15/2017  . TB lung, latent 04/16/2017  . Lung nodules 04/16/2017  . Diabetes mellitus screening 11/19/2016  . Fibromyalgia 05/15/2015  . Rheumatoid arthritis ([place]) 08/22/2014  . HTN (hypertension) 08/22/2014  . Tobacco abuse 08/22/2014  . Hepatic cirrhosis ([place]) 05/31/2014  . Chronic hepatitis C virus infection ([place]) 05/03/2014    Past Surgical History:  Procedure Laterality Date  .  ABDOMINAL HYSTERECTOMY    . CATARACT EXTRACTION, BILATERAL    . COLONOSCOPY    . COLONOSCOPY  04/04/2020  . DIRECT LARYNGOSCOPY N/A 06/26/2020   Procedure: DIRECT LARYNGOSCOPY AND BIOPSY;  Surgeon: Rozetta Nunnery, MD;  Location: [facility];  Service: ENT;  Laterality: N/A;  . IR GASTROSTOMY TUBE MOD SED  07/27/2020  . IR IMAGING GUIDED PORT INSERTION  07/27/2020  . KNEE SURGERY Right   . POLYPECTOMY  11/17/2008   HPP x 1      OB  History   No obstetric history on file.     Family History  Problem Relation Age of Onset  . Heart disease Mother   . Cancer Mother   . Melanoma Mother   . Esophageal cancer Father   . Colon cancer Neg Hx   . Colon polyps Neg Hx   . Rectal cancer Neg Hx   . Stomach cancer Neg Hx     Social History   Tobacco Use  . Smoking status: Current Every Day Smoker    Packs/day: 1.00    Years: 50.00    Pack years: 50.00    Types: Cigarettes    Start date: 89  . Smokeless tobacco: Never Used  . Tobacco comment: 4 cigs a day   Vaping Use  . Vaping Use: Never used  Substance Use Topics  . Alcohol use: No    Alcohol/week: 0.0 standard drinks  . Drug use: No    Home Medications Prior to Admission medications   Medication Sig Start Date End Date Taking? Authorizing Provider  albuterol (PROVENTIL) (2.5 MG/3ML) 0.083% nebulizer solution Take 3 mLs (2.5 mg total) by nebulization every 6 (six) hours as needed for wheezing or shortness of breath. 05/11/20  Yes Ladell Pier, MD  albuterol (VENTOLIN HFA) 108 (90 Base) MCG/ACT inhaler INHALE 2 PUFFS INTO THE LUNGS EVERY 6 HOURS AS NEEDED FOR WHEEZING OR SHORTNESS OF BREATH Patient taking differently: Inhale 2 puffs into the lungs every 6 (six) hours as needed for wheezing or shortness of breath. 03/29/20  Yes Magdalen Spatz, NP  BEVESPI AEROSPHERE 9-4.8 MCG/ACT AERO INHALE 2 PUFFS INTO THE LUNGS TWICE DAILY Patient taking differently: Inhale 2 puffs into the lungs in the morning and at bedtime. 04/11/20  Yes Mannam, Praveen, MD  dexamethasone (DECADRON) 4 MG tablet Take 2 tablets (8 mg total) by mouth daily. Take daily x 3 days starting the day after cisplatin chemotherapy. Take with food. 07/13/20  Yes Benay Pike, MD  K Phos Mono-Sod Phos Di & Mono (K-PHOS-NEUTRAL) [phone] MG TABS Take 2 tablets by mouth in the morning, at noon, in the evening, and at bedtime. Ok to dissolve tablets in 8 oz of water and put in feeding tube. 08/30/20  09/29/20 Yes Benay Pike, MD  lactose free nutrition (BOOST) LIQD Take 237 mLs by mouth 3 (three) times daily between meals.   Yes [provider]  lidocaine (XYLOCAINE) 2 % solution Patient: Mix 1part 2% viscous lidocaine, 1part H20. Swallow 70mL of diluted mixture, 109min before meals and at bedtime, up to QID Patient taking differently: Use as directed 15 mLs in the mouth or throat in the morning, at noon, in the evening, and at bedtime. Patient: Mix 1part 2% viscous lidocaine, 1part H20. Swallow 90mL of diluted mixture, 28min before meals and at bedtime, up to QID 07/24/20  Yes Eppie Gibson, MD  Nutritional Supplements (FEEDING SUPPLEMENT, OSMOLITE 1.5 CAL,) LIQD Give 1 1/2 carton of osmolite 1.5 4 times per  day (8am, noon, 4pm and 8pm) via feeding tube.  Flush with 39ml of water before and after feeding.  Drink or give additional 222ml TID of water between feedings. Patient taking differently: Place 237 mLs into feeding tube See admin instructions. Give 1 1/2 carton of osmolite 1.5 4 times per day (8am, noon, 4pm and 8pm) via feeding tube.  Flush with 3ml of water before and after feeding.  Drink or give additional 239ml TID of water between feedings. 08/23/20  Yes Eppie Gibson, MD  ondansetron (ZOFRAN) 8 MG tablet Take 1 tablet (8 mg total) by mouth 2 (two) times daily as needed. Start on the third day after cisplatin chemotherapy. Patient taking differently: Take 8 mg by mouth 2 (two) times daily as needed for refractory nausea / vomiting. Start on the third day after cisplatin chemotherapy. 07/13/20  Yes Benay Pike, MD  prochlorperazine (COMPAZINE) 10 MG tablet Take 1 tablet (10 mg total) by mouth every 6 (six) hours as needed (Nausea or vomiting). Patient taking differently: Take 10 mg by mouth every 6 (six) hours as needed for nausea or vomiting (Nausea or vomiting). 07/13/20  Yes Benay Pike, MD    Allergies    Caprice Renshaw pressair [aclidinium bromide], Amitriptyline, Codeine,  and Tramadol  Review of Systems   Review of Systems  Constitutional: Negative for chills, diaphoresis, fatigue and fever.  HENT: Negative for congestion, sore throat and trouble swallowing.   Eyes: Negative for pain and visual disturbance.  Respiratory: Positive for shortness of breath. Negative for cough and wheezing.   Cardiovascular: Positive for palpitations. Negative for chest pain and leg swelling.  Gastrointestinal: Negative for abdominal distention, abdominal pain, diarrhea, nausea and vomiting.  Genitourinary: Negative for difficulty urinating.  Musculoskeletal: Negative for back pain, neck pain and neck stiffness.  Skin: Negative for pallor.  Neurological: Negative for dizziness, speech difficulty, weakness and headaches.  Psychiatric/Behavioral: Negative for confusion.    Physical Exam Updated Vital Signs BP (!) 130/50   Pulse 89   Temp 98.1 F (36.7 C) (Oral)   Resp 16   SpO2 96%   Physical Exam Constitutional:      General: She is not in acute distress.    Appearance: Normal appearance. She is ill-appearing. She is not toxic-appearing or diaphoretic.     Comments: Patient is laying in bed, no acute distress, does appear cachectic and frail.  HENT:     Mouth/Throat:     Mouth: Mucous membranes are moist.     Pharynx: Oropharynx is clear.  Eyes:     General: No scleral icterus.    Extraocular Movements: Extraocular movements intact.     Pupils: Pupils are equal, round, and reactive to light.  Cardiovascular:     Rate and Rhythm: Normal rate and regular rhythm.     Pulses: Normal pulses.     Heart sounds: Normal heart sounds.  Pulmonary:     Effort: Pulmonary effort is normal. No respiratory distress.     Breath sounds: Normal breath sounds. No stridor. No wheezing, rhonchi or rales.  Chest:     Chest wall: No tenderness.  Abdominal:     General: Abdomen is flat. There is no distension.     Palpations: Abdomen is soft.     Tenderness: There is no abdominal  tenderness. There is no guarding or rebound.     Comments: PEG tube in place, no signs of infection  Musculoskeletal:        General: No swelling or tenderness. Normal range  of motion.     Cervical back: Normal range of motion and neck supple. No rigidity.     Right lower leg: No edema.     Left lower leg: No edema.  Skin:    General: Skin is warm and dry.     Capillary Refill: Capillary refill takes less than 2 seconds.     Coloration: Skin is not pale.  Neurological:     General: No focal deficit present.     Mental Status: She is alert and oriented to person, place, and time.     Comments: Alert and oriented x3, clear speech, no facial droop, cranial nerves II through XII grossly intact, 5 out of 5 strength to upper and lower extremities.  Normal finger-nose.  Negative pronator drift.   Psychiatric:        Mood and Affect: Mood normal.        Behavior: Behavior normal.     ED Results / Procedures / Treatments   Labs (all labs ordered are listed, but only abnormal results are displayed) Labs Reviewed  CBC WITH DIFFERENTIAL/PLATELET - Abnormal; Notable for the following components:      Result Value   WBC 1.5 (*)    RBC 2.94 (*)    Hemoglobin 9.6 (*)    HCT 27.1 (*)    RDW 16.2 (*)    Platelets 99 (*)    nRBC 1.4 (*)    Neutro Abs 0.6 (*)    Lymphs Abs 0.3 (*)    All other components within normal limits  COMPREHENSIVE METABOLIC PANEL - Abnormal; Notable for the following components:   Potassium 3.4 (*)    Calcium 8.3 (*)    Total Protein 6.0 (*)    Albumin 2.5 (*)    All other components within normal limits  MAGNESIUM - Abnormal; Notable for the following components:   Magnesium 1.4 (*)    All other components within normal limits  RESP PANEL BY RT-PCR (FLU A&B, COVID) ARPGX2  CULTURE, BLOOD (ROUTINE X 2)  CULTURE, BLOOD (ROUTINE X 2)  LACTIC ACID, PLASMA  URINALYSIS, ROUTINE W REFLEX MICROSCOPIC  LACTIC ACID, PLASMA    EKG None  Radiology DG Chest Port 1  View  Result Date: 09/12/2020 CLINICAL DATA:  Patient with supraglottic cancer with failure to thrive. EXAM: PORTABLE CHEST 1 VIEW COMPARISON:  Chest CT January 20, 2020. FINDINGS: Accessed right chest porta catheter with tip overlying the superior cavoatrial junction. The heart size and mediastinal contours are within normal limits. Aortic atherosclerosis. Emphysematous lung changes. Opacity in the left lung base. The visualized skeletal structures are unremarkable. IMPRESSION: 1. Opacity in the left lung base which could represent atelectasis versus pneumonia. 2. Aortic Atherosclerosis (ICD10-I70.0) and Emphysema (ICD10-J43.9). Electronically Signed   By: Dahlia Bailiff MD   On: 09/12/2020 15:22    Procedures .Critical Care Performed by: Alfredia Client, PA-C Authorized by: Alfredia Client, PA-C   Critical care provider statement:    Critical care time (minutes):  45   Critical care was time spent personally by me on the following activities:  Discussions with consultants, evaluation of patient's response to treatment, examination of patient, ordering and performing treatments and interventions, ordering and review of laboratory studies, ordering and review of radiographic studies, pulse oximetry, re-evaluation of patient's condition, obtaining history from patient or surrogate and review of old charts     Medications Ordered in ED Medications  vancomycin (VANCOREADY) IVPB 1250 mg/250 mL (1,250 mg Intravenous New Bag/Given 09/12/20 1736)  magnesium sulfate IVPB 4 g 100 mL (4 g Intravenous New Bag/Given 09/12/20 1733)  albuterol (PROVENTIL) (2.5 MG/3ML) 0.083% nebulizer solution 2.5 mg (has no administration in time range)  dexamethasone (DECADRON) tablet 8 mg (has no administration in time range)  K-Phos-Neutral [phone] MG TABS 2 tablet (has no administration in time range)  lidocaine (XYLOCAINE) 2 % viscous mouth solution 15 mL (has no administration in time range)  lactose free nutrition (Boost)  liquid 237 mL (has no administration in time range)  feeding supplement (OSMOLITE 1.5 CAL) liquid 237 mL (has no administration in time range)  prochlorperazine (COMPAZINE) tablet 10 mg (has no administration in time range)  sodium chloride 0.9 % bolus 1,000 mL (1,000 mLs Intravenous Bolus 09/12/20 1546)  ceFEPIme (MAXIPIME) 2 g in sodium chloride 0.9 % 100 mL IVPB (0 g Intravenous Stopped 09/12/20 1735)    ED Course  I have reviewed the triage vital signs and the nursing notes.  Pertinent labs & imaging results that were available during my care of the patient were reviewed by me and considered in my medical decision making (see chart for details).    MDM Rules/Calculators/A&P                          Raven Torres is a 65 y.o. 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  Patient with positive orthostatics, appears as if patient has had low p.o. intake for the past couple of weeks now, worsening over the past couple of days due to mouth ulcers from radiation.  Patient has not been using PEG tube appropriately.  Will obtain basic work-up at this time.  Was called out by nursing that patient's O2 sat did drop down to 85% to 88% on room air, good waveform.  Was placed on 2 L of oxygen.  Chest x-ray found to have new pneumonia, will treat for this at this time.  With new oxygen requirement, with pneumonia and orthostatic hypotension patient to be admitted.  Patient was admitted to [facility]  The patient appears reasonably stabilized for admission considering the current resources, flow, and capabilities available in the ED at this time, and I doubt any other [facility] requiring further screening and/or treatment in the ED prior to admission.  I discussed this case  with my attending physician who cosigned this note including patient's presenting symptoms, physical exam, and planned diagnostics and interventions. Attending physician stated agreement with plan or made changes to plan which were implemented.   Attending physician assessed patient at bedside.  Final Clinical Impression(s) / ED Diagnoses Final diagnoses:  Pneumonia of left lower lobe due to infectious organism  Orthostatic hypotension    Rx / DC Orders ED Discharge Orders    None       Alfredia Client, PA-C 09/12/20 1737    Davonna Belling, MD 09/14/20 [phone]

## 2020-09-12 NOTE — Progress Notes (Signed)
Pharmacy Antibiotic Note  Raven Torres is a 65 y.o. female admitted on 09/12/2020 with pneumonia.  Pharmacy has been consulted for vancomycin & cefepime dosing.  Plan: Vancomycin 1250 mg IV x1 loading dose vancomycin 500 mg IV q12h for est AUC ~ 430, Css min 12.9 using TBW, SCr 0.8. Vd 0.72 Cefepime 2 gm IV q8h F/u renal function, WBC, temp, culture data F/u MRSA PCR Vancomycin levels as needed    Temp (24hrs), Avg:97.7 F (36.5 C), Min:97.2 F (36.2 C), Max:98.1 F (36.7 C)  Recent Labs  Lab 09/07/20 1311 09/12/20 1452 09/12/20 1615  WBC 0.7* 1.5*  --   CREATININE 0.61 0.49  --   LATICACIDVEN  --   --  0.8    Estimated Creatinine Clearance: 62.2 mL/min (by C-G formula based on SCr of 0.49 mg/dL).    Allergies  Allergen Reactions  . Tudorza Pressair [Aclidinium Bromide] Itching  . Amitriptyline Other (See Comments)    Eye spasms per pt  . Codeine Nausea Only  . Tramadol Nausea Only   Antimicrobials this admission:  2/8 Vanc >> 2/8 cefepime >> Dose adjustments this admission:   Microbiology results:  2/8 BCx2: 2/8 Covid: 2/8 MRSA:   Thank you for allowing pharmacy to be a part of this patient's care.  Eudelia Bunch, Pharm.D 09/12/2020 6:28 PM

## 2020-09-12 NOTE — Progress Notes (Signed)
Patient presents today after 29th fraction to the supraglottis. Patient continues to loose weight and continues to only use PEG 2x day despite encouragement to increase feedings since it is difficult for her to swallow. Continues to deal with mouth sores and sore throat. Voice is more hoarse and patient states her saliva is thicker. Denies any ear or jaw pain, or difficulty opening her mouth fully. States she has been struggling with diarrhea but, denies any interventions like  lomotil or imodium.  Reports increase in fatigue and that she mainly stays in bed when she's not coming to [facility] for treatments. Reports mild sensitivity to skin in treatment field and states she only applies Sonafine to her skin when she remembers. On assessment skin is red but intact.   Per Dr. Isidore Moos on-call hospitalist would like patient to proceed to ED for work-up before getting admitted for failure to thrive. ED charge nurse notified and will call back when room available in ED. PAC accessed at 13:37 and IVFs started.   Vitals:   09/12/20 1300 09/12/20 1306  BP: (!) 103/55 (!) 65/50  Pulse: 86 (!) 115  Resp: 20   Temp: (!) 97.2 F (36.2 C)   SpO2: 97%

## 2020-09-12 NOTE — ED Notes (Signed)
Date and time results received: 09/12/20 1554 (use smartphrase ".now" to insert current time)  Test: WBC 1.45 Critical Value: WBC 1.45  Name of Provider Notified: Shalyn PA  Orders Received? Or Actions Taken?: PA aware

## 2020-09-12 NOTE — ED Notes (Signed)
Coming from cancer center-states orthostatic hypotension admission per oncologist

## 2020-09-12 NOTE — Progress Notes (Signed)
RT CONSULT NOTE:  Pt has a history of COPD at baseline. She takes Alb nebulizers Q6 PRN for wheezing/SOB, Alb inhaler Q6 PRN and Bevespi Aerosphere BID at home. She is currently on 2L [place] with saturations of 96-97% but is on room air at baseline. CXR shows atelectasis in the left lung. She is diminished to auscultation. Per pt quit smoking about 6-8 months ago. Pt RT protocol assessment score is a 4 at this time. RT recommendations would be to continue Alb Q6 nebs as needed, her maintenance inhaler and incentive spirometry. Vitals stable at this time, RT will continue to monitor.

## 2020-09-12 NOTE — ED Notes (Signed)
Pts o2 sat on RA dropped to 85%-88% on RA. Pt denies any SHOB, NAD noted. No increased WOB noted. PA aware

## 2020-09-12 NOTE — ED Triage Notes (Signed)
Pt arrives from the [facility] with complaints of failure to thrive. Pt has supraglottic cancer. Pt has daily radiation and weekly chemo. Pt has PEG in place.Per Cancer RN: pt does not use PEG tube appropriately. Pt is unable to take PO due to the pain from radiation. Pt was found to be hypotensive with cancer center.  BP lying:129/64; HR:94 BP sitting: 103/55; HR:86 BP standing: 65/50; HR:115

## 2020-09-12 NOTE — H&P (Addendum)
History and Physical    Raven Torres TKW:[id number] DOB: [birth year] DOA: 09/12/2020  PCP: Ladell Pier, MD   Patient coming from:   Chief Complaint  Patient presents with  . Failure To Thrive      HPI: Raven Torres is a 65 y.o. 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 Chryl Heck, w/ PEG in place, who had XRT today where Orthostat[facility] vitals :BP lying:129/64; HR:94,BP sitting: 103/55; HR:86, BP standing: 65/50; HR:115 and 1 liter NS Bolus was given and sent to ED. As per the report patient has been having issue with nutrition and has missed appointments for IV infusion and nutritional consult.  Patient complains of dizziness palpitation on standing and also generalized weakness and mild cough and dyspnea with activity.  She denies any chest pain, calf pain or leg swelling, fever nausea vomiting abdominal pain, focal weakness dysuria.  She is on lactulose had some diarrhea recently.    ED Course: BP stable, labs showed hypokalemia, hypomagnesemia leukopenia, thrombocytopenia , CXR-opacity in the left lung atelectasis versus pneumonia. Patient was given 1 L bolus additionally, placed on vancomycin and cefepime and admission was requested for further management.  Review of Systems: All systems were reviewed and were negative except as mentioned in HPI above. Negative for fever Negative for chest pain Negative for focal weakness  Past Medical History:  Diagnosis Date  . Arthritis   . Cataract    removed both eyes   . Colitis   . COPD (chronic obstructive pulmonary disease) ([place])   . Hepatitis C   . Hyperlipidemia   . Hypertension   . Shingles 03/27/2020  . TB (tuberculosis)    treatment     Past Surgical History:  Procedure Laterality Date  . ABDOMINAL HYSTERECTOMY    . CATARACT EXTRACTION, BILATERAL    . COLONOSCOPY    .  COLONOSCOPY  04/04/2020  . DIRECT LARYNGOSCOPY N/A 06/26/2020   Procedure: DIRECT LARYNGOSCOPY AND BIOPSY;  Surgeon: Rozetta Nunnery, MD;  Location: [facility];  Service: ENT;  Laterality: N/A;  . IR GASTROSTOMY TUBE MOD SED  07/27/2020  . IR IMAGING GUIDED PORT INSERTION  07/27/2020  . KNEE SURGERY Right   . POLYPECTOMY  11/17/2008   HPP x 1      reports that she has been smoking cigarettes. She started smoking about 51 years ago. She has a 50.00 pack-year smoking history. She has never used smokeless tobacco. She reports that she does not drink alcohol and does not use drugs.  Allergies  Allergen Reactions  . Tudorza Pressair [Aclidinium Bromide] Itching  . Amitriptyline Other (See Comments)    Eye spasms per pt  . Codeine Nausea Only  . Tramadol Nausea Only    Family History  Problem Relation Age of Onset  . Heart disease Mother   . Cancer Mother   . Melanoma Mother   . Esophageal cancer Father   . Colon cancer Neg Hx   . Colon polyps Neg Hx   . Rectal cancer Neg Hx   . Stomach cancer Neg Hx      Prior to Admission medications   Medication Sig Start Date End Date Taking? Authorizing Provider  albuterol (PROVENTIL) (2.5 MG/3ML) 0.083% nebulizer solution Take 3 mLs (2.5 mg total) by nebulization every 6 (six) hours as needed for wheezing or shortness of breath. 05/11/20  Yes Karle Plumber  B, MD  albuterol (VENTOLIN HFA) 108 (90 Base) MCG/ACT inhaler INHALE 2 PUFFS INTO THE LUNGS EVERY 6 HOURS AS NEEDED FOR WHEEZING OR SHORTNESS OF BREATH Patient taking differently: Inhale 2 puffs into the lungs every 6 (six) hours as needed for wheezing or shortness of breath. 03/29/20  Yes Magdalen Spatz, NP  BEVESPI AEROSPHERE 9-4.8 MCG/ACT AERO INHALE 2 PUFFS INTO THE LUNGS TWICE DAILY Patient taking differently: Inhale 2 puffs into the lungs in the morning and at bedtime. 04/11/20  Yes Mannam, Praveen, MD  dexamethasone (DECADRON) 4 MG tablet Take 2 tablets (8 mg total) by mouth daily.  Take daily x 3 days starting the day after cisplatin chemotherapy. Take with food. 07/13/20  Yes Benay Pike, MD  K Phos Mono-Sod Phos Di & Mono (K-PHOS-NEUTRAL) [phone] MG TABS Take 2 tablets by mouth in the morning, at noon, in the evening, and at bedtime. Ok to dissolve tablets in 8 oz of water and put in feeding tube. 08/30/20 09/29/20 Yes Benay Pike, MD  lactose free nutrition (BOOST) LIQD Take 237 mLs by mouth 3 (three) times daily between meals.   Yes [provider]  lidocaine (XYLOCAINE) 2 % solution Patient: Mix 1part 2% viscous lidocaine, 1part H20. Swallow 10mL of diluted mixture, 76min before meals and at bedtime, up to QID Patient taking differently: Use as directed 15 mLs in the mouth or throat in the morning, at noon, in the evening, and at bedtime. Patient: Mix 1part 2% viscous lidocaine, 1part H20. Swallow 42mL of diluted mixture, 28min before meals and at bedtime, up to QID 07/24/20  Yes Eppie Gibson, MD  Nutritional Supplements (FEEDING SUPPLEMENT, OSMOLITE 1.5 CAL,) LIQD Give 1 1/2 carton of osmolite 1.5 4 times per day (8am, noon, 4pm and 8pm) via feeding tube.  Flush with 66ml of water before and after feeding.  Drink or give additional 279ml TID of water between feedings. Patient taking differently: Place 237 mLs into feeding tube See admin instructions. Give 1 1/2 carton of osmolite 1.5 4 times per day (8am, noon, 4pm and 8pm) via feeding tube.  Flush with 37ml of water before and after feeding.  Drink or give additional 229ml TID of water between feedings. 08/23/20  Yes Eppie Gibson, MD  ondansetron (ZOFRAN) 8 MG tablet Take 1 tablet (8 mg total) by mouth 2 (two) times daily as needed. Start on the third day after cisplatin chemotherapy. Patient taking differently: Take 8 mg by mouth 2 (two) times daily as needed for refractory nausea / vomiting. Start on the third day after cisplatin chemotherapy. 07/13/20  Yes Benay Pike, MD  prochlorperazine (COMPAZINE)  10 MG tablet Take 1 tablet (10 mg total) by mouth every 6 (six) hours as needed (Nausea or vomiting). Patient taking differently: Take 10 mg by mouth every 6 (six) hours as needed for nausea or vomiting (Nausea or vomiting). 07/13/20  Yes Iruku, Arletha Pili, MD  atorvastatin (LIPITOR) 40 MG tablet TAKE 1 TABLET(40 MG) BY MOUTH DAILY AT 6 PM Patient not taking: No sig reported 09/04/20   Ladell Pier, MD  buPROPion (WELLBUTRIN) 100 MG tablet TAKE 1 TABLET(100 MG) BY MOUTH TWICE DAILY Patient not taking: No sig reported 09/04/20   Ladell Pier, MD  gabapentin (NEURONTIN) 100 MG capsule Take 2 capsules (200 mg total) by mouth 2 (two) times daily. Patient not taking: No sig reported 03/24/20   Ladell Pier, MD  lidocaine-prilocaine (EMLA) cream Apply to affected area once Patient not taking: No sig reported 07/13/20  Benay Pike, MD  LORazepam (ATIVAN) 0.5 MG tablet Take 1 tablet (0.5 mg total) by mouth every 6 (six) hours as needed (Nausea or vomiting). Patient not taking: No sig reported 07/13/20   Benay Pike, MD  valACYclovir (VALTREX) 1000 MG tablet Take 1 tablet (1,000 mg total) by mouth 3 (three) times daily. Patient not taking: No sig reported 03/24/20   Ladell Pier, MD    Physical Exam: Vitals:   09/12/20 1417 09/12/20 1500 09/12/20 1545 09/12/20 1630  BP: (!) 139/58 107/84 (!) 112/46 (!) 130/50  Pulse: 92 85 87 89  Resp: 16 16 (!) 21 16  Temp: 98.1 F (36.7 C)     TempSrc: Oral     SpO2: 96% 92% 96% 96%    General exam: AAOx3, weak looking frail, old for her age, HEENT:Oral mucosa moist, Ear/Nose WNL grossly, dentition normal. Respiratory system: bilaterally diminished with crackles on the left base needing 2 L supplemental oxygen.  Right chest with Port-A-Cath-no redness or drainage. Cardiovascular system: S1 & S2 +, No JVD,. Gastrointestinal system: Abdomen soft, NT,ND, BS+.  PEG tube in place. Nervous System:Alert, awake, moving extremities and  grossly nonfocal Extremities: No edema, distal peripheral pulses palpable.  Skin: No rashes,no icterus. MSK: Normal muscle bulk,tone, power   Labs on Admission: I have personally reviewed following labs and imaging studies  CBC: Recent Labs  Lab 09/07/20 1311 09/12/20 1452  WBC 0.7* 1.5*  NEUTROABS 0.4* 0.6*  HGB 10.2* 9.6*  HCT 30.6* 27.1*  MCV 89.5 92.2  PLT 37* 99*   Basic Metabolic Panel: Recent Labs  Lab 09/07/20 1311 09/12/20 1452  NA 141 139  K 3.0* 3.4*  CL 104 103  CO2 29 24  GLUCOSE 123* 90  BUN 15 19  CREATININE 0.61 0.49  CALCIUM 8.4* 8.3*  MG 2.5* 1.4*   GFR: Estimated Creatinine Clearance: 62.2 mL/min (by C-G formula based on SCr of 0.49 mg/dL). Liver Function Tests: Recent Labs  Lab 09/12/20 1452  AST 22  ALT 13  ALKPHOS 45  BILITOT 1.0  PROT 6.0*  ALBUMIN 2.5*   No results for input(s): LIPASE, AMYLASE in the last 168 hours. No results for input(s): AMMONIA in the last 168 hours. Coagulation Profile: No results for input(s): INR, PROTIME in the last 168 hours. Cardiac Enzymes: No results for input(s): CKTOTAL, CKMB, CKMBINDEX, TROPONINI in the last 168 hours. BNP (last 3 results) No results for input(s): PROBNP in the last 8760 hours. HbA1C: No results for input(s): HGBA1C in the last 72 hours. CBG: No results for input(s): GLUCAP in the last 168 hours. Lipid Profile: No results for input(s): CHOL, HDL, LDLCALC, TRIG, CHOLHDL, LDLDIRECT in the last 72 hours. Thyroid Function Tests: No results for input(s): TSH, T4TOTAL, FREET4, T3FREE, THYROIDAB in the last 72 hours. Anemia Panel: No results for input(s): VITAMINB12, FOLATE, FERRITIN, TIBC, IRON, RETICCTPCT in the last 72 hours. 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 EXAM: PORTABLE CHEST 1 VIEW COMPARISON:  Chest CT January 20, 2020. FINDINGS: Accessed right chest porta catheter with tip overlying the superior cavoatrial junction. The heart size and mediastinal contours are within normal limits. Aortic atherosclerosis. Emphysematous  lung changes. Opacity in the left lung base. The visualized skeletal structures are unremarkable. IMPRESSION: 1. Opacity in the left lung base which could represent atelectasis versus pneumonia. 2. Aortic Atherosclerosis (ICD10-I70.0) and Emphysema (ICD10-J43.9). 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  Will admit the patient, dietitian consult hypothyroidism monitor orthostatics.  PT OT eval  Hypomagnesemia/hypokalemia we will replete lites aggressively.  Likely in the setting of #1 failure to thrive  Left lower base pneumonia :continue antibiotics vancomycin and cefepime, pharmacy to dose.  Continue monitoring patient's WBC count, and f/u blood cultures.  We will check procalcitonin level, consulted speech Recent Labs  Lab 09/07/20 1311 09/12/20 1452 09/12/20 1615  WBC 0.7* 1.5*  --   LATICACIDVEN  --   --  0.8   Acute hypoxic respiratory failure, saturation 87% room air needing 2 L nasal cannula.  In the setting of pneumonia/COPD. Continue antibiotics, supplemental oxygen,  Hypertension- holding meds  RA-supportive measures pain control  Hyperlipidemia continue statin  Chronic Hep c w/ liver cirrhosis: Compensated.   COPD/chronic emphysema not on home oxygen/ hx of Latent TB treated 3 yrs ago.  Not in exacerbation continue home inhalers.   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 Chryl Heck, and Rad Onc.   Will need close outpatient follow-up.  Will discuss with her oncologist tomorrow  Pancytopenia with anemia thrombocytopenia and leukopenia. monitor count. It is 2/2 from chemo.  Continue neutropenic precaution. Recent Labs  Lab 09/07/20 1311 09/12/20 1452  HGB 10.2* 9.6*  HCT 30.6* 27.1*  WBC 0.7* 1.5*  PLT 37* 99*   There is no height or weight on file to calculate BMI.   Severity of Illness:  * I certify that at the point of admission it is my clinical judgment that the patient will require inpatient hospital care spanning beyond 2 midnights from the point of admission due to high intensity of service, high risk for further deterioration and high frequency of surveillance required due to immunocompromise status with pneumonia hypertension, leukopenia, acute hypoxic respiratory failure and failure to thrive    DVT prophylaxis: lovenox Code Status:   Code Status: DNR as per patient's wishes, witnessed by her nurse in the ED Family Communication: Admission, patients condition and plan of care including tests being ordered have been discussed with the patient  And her daughter [name] ( called over the phone, no answer) who indicate understanding and agree with the plan of care  and Code Status.  Consults called:  None  Antonieta Pert MD [facility]  If 7PM-7AM, please contact night-coverage [email or web address]  09/12/2020, 5:45 PM

## 2020-09-13 ENCOUNTER — Inpatient Hospital Stay: Payer: Medicare Other

## 2020-09-13 ENCOUNTER — Ambulatory Visit: Payer: Medicare Other

## 2020-09-13 ENCOUNTER — Inpatient Hospital Stay: Payer: Medicare Other | Admitting: Hematology and Oncology

## 2020-09-13 DIAGNOSIS — E43 Unspecified severe protein-calorie malnutrition: Secondary | ICD-10-CM | POA: Insufficient documentation

## 2020-09-13 DIAGNOSIS — M069 Rheumatoid arthritis, unspecified: Secondary | ICD-10-CM

## 2020-09-13 DIAGNOSIS — C321 Malignant neoplasm of supraglottis: Secondary | ICD-10-CM

## 2020-09-13 DIAGNOSIS — U071 COVID-19: Secondary | ICD-10-CM

## 2020-09-13 DIAGNOSIS — B182 Chronic viral hepatitis C: Secondary | ICD-10-CM

## 2020-09-13 DIAGNOSIS — I10 Essential (primary) hypertension: Secondary | ICD-10-CM

## 2020-09-13 DIAGNOSIS — J189 Pneumonia, unspecified organism: Secondary | ICD-10-CM

## 2020-09-13 LAB — CBC
HCT: 23.8 % — ABNORMAL LOW (ref 36.0–46.0)
Hemoglobin: 8.1 g/dL — ABNORMAL LOW (ref 12.0–15.0)
MCH: 31.6 pg (ref 26.0–34.0)
MCHC: 34 g/dL (ref 30.0–36.0)
MCV: 93 fL (ref 80.0–100.0)
Platelets: 82 10*3/uL — ABNORMAL LOW (ref 150–400)
RBC: 2.56 MIL/uL — ABNORMAL LOW (ref 3.87–5.11)
RDW: 16.8 % — ABNORMAL HIGH (ref 11.5–15.5)
WBC: 1 10*3/uL — CL (ref 4.0–10.5)
nRBC: 0 % (ref 0.0–0.2)

## 2020-09-13 LAB — CBC WITH DIFFERENTIAL/PLATELET
Abs Immature Granulocytes: 0.03 10*3/uL (ref 0.00–0.07)
Basophils Absolute: 0 10*3/uL (ref 0.0–0.1)
Basophils Relative: 0 %
Eosinophils Absolute: 0 10*3/uL (ref 0.0–0.5)
Eosinophils Relative: 0 %
HCT: 24.5 % — ABNORMAL LOW (ref 36.0–46.0)
Hemoglobin: 8.5 g/dL — ABNORMAL LOW (ref 12.0–15.0)
Immature Granulocytes: 3 %
Lymphocytes Relative: 24 %
Lymphs Abs: 0.2 10*3/uL — ABNORMAL LOW (ref 0.7–4.0)
MCH: 32 pg (ref 26.0–34.0)
MCHC: 34.7 g/dL (ref 30.0–36.0)
MCV: 92.1 fL (ref 80.0–100.0)
Monocytes Absolute: 0.2 10*3/uL (ref 0.1–1.0)
Monocytes Relative: 16 %
Neutro Abs: 0.5 10*3/uL — ABNORMAL LOW (ref 1.7–7.7)
Neutrophils Relative %: 57 %
Platelets: 87 10*3/uL — ABNORMAL LOW (ref 150–400)
RBC: 2.66 MIL/uL — ABNORMAL LOW (ref 3.87–5.11)
RDW: 16.4 % — ABNORMAL HIGH (ref 11.5–15.5)
WBC: 0.9 10*3/uL — CL (ref 4.0–10.5)
nRBC: 0 % (ref 0.0–0.2)

## 2020-09-13 LAB — COMPREHENSIVE METABOLIC PANEL
ALT: 13 U/L (ref 0–44)
AST: 20 U/L (ref 15–41)
Albumin: 2.3 g/dL — ABNORMAL LOW (ref 3.5–5.0)
Alkaline Phosphatase: 40 U/L (ref 38–126)
Anion gap: 7 (ref 5–15)
BUN: 13 mg/dL (ref 8–23)
CO2: 24 mmol/L (ref 22–32)
Calcium: 7.4 mg/dL — ABNORMAL LOW (ref 8.9–10.3)
Chloride: 105 mmol/L (ref 98–111)
Creatinine, Ser: 0.6 mg/dL (ref 0.44–1.00)
GFR, Estimated: >60 mL/min (ref >60)
Glucose, Bld: 165 mg/dL — ABNORMAL HIGH (ref 70–99)
Potassium: 3.8 mmol/L (ref 3.5–5.1)
Sodium: 136 mmol/L (ref 135–145)
Total Bilirubin: 0.7 mg/dL (ref 0.3–1.2)
Total Protein: 5.3 g/dL — ABNORMAL LOW (ref 6.5–8.1)

## 2020-09-13 LAB — D-DIMER, QUANTITATIVE
D-Dimer, Quant: 9.36 ug/mL-FEU — ABNORMAL HIGH (ref 0.00–0.50)
D-Dimer, Quant: 9.76 ug/mL-FEU — ABNORMAL HIGH (ref 0.00–0.50)

## 2020-09-13 LAB — EXPECTORATED SPUTUM ASSESSMENT W GRAM STAIN, RFLX TO RESP C

## 2020-09-13 LAB — STREP PNEUMONIAE URINARY ANTIGEN: Strep Pneumo Urinary Antigen: POSITIVE — AB

## 2020-09-13 LAB — C-REACTIVE PROTEIN
CRP: 4.3 mg/dL — ABNORMAL HIGH (ref <1.0)
CRP: 4.4 mg/dL — ABNORMAL HIGH (ref <1.0)

## 2020-09-13 LAB — MRSA PCR SCREENING: MRSA by PCR: NEGATIVE

## 2020-09-13 LAB — CREATININE, SERUM
Creatinine, Ser: 0.55 mg/dL (ref 0.44–1.00)
GFR, Estimated: >60 mL/min (ref >60)

## 2020-09-13 LAB — FERRITIN: Ferritin: 983 ng/mL — ABNORMAL HIGH (ref 11–307)

## 2020-09-13 LAB — PROCALCITONIN
Procalcitonin: <0.1 ng/mL
Procalcitonin: <0.1 ng/mL

## 2020-09-13 LAB — LACTIC ACID, PLASMA: Lactic Acid, Venous: 0.6 mmol/L (ref 0.5–1.9)

## 2020-09-13 LAB — TROPONIN I (HIGH SENSITIVITY): Troponin I (High Sensitivity): 21 ng/L — ABNORMAL HIGH (ref <18)

## 2020-09-13 LAB — MAGNESIUM: Magnesium: 2 mg/dL (ref 1.7–2.4)

## 2020-09-13 LAB — HIV ANTIBODY (ROUTINE TESTING W REFLEX): HIV Screen 4th Generation wRfx: NONREACTIVE

## 2020-09-13 MED ORDER — ALBUTEROL SULFATE HFA 108 (90 BASE) MCG/ACT IN AERS
2.0000 | INHALATION_SPRAY | Freq: Three times a day (TID) | RESPIRATORY_TRACT | Status: DC
  Administered 2020-09-14 (×2): 2 via RESPIRATORY_TRACT
  Filled 2020-09-13: qty 6.7

## 2020-09-13 MED ORDER — ENSURE ENLIVE PO LIQD
237.0000 mL | Freq: Two times a day (BID) | ORAL | Status: DC
  Administered 2020-09-14 – 2020-09-16 (×4): 237 mL via ORAL

## 2020-09-13 MED ORDER — BOOST PLUS PO LIQD
237.0000 mL | Freq: Three times a day (TID) | ORAL | Status: DC
  Administered 2020-09-13: 237 mL via ORAL
  Filled 2020-09-13 (×3): qty 237

## 2020-09-13 NOTE — Progress Notes (Signed)
Initial Nutrition Assessment  DOCUMENTATION CODES:   Severe malnutrition in context of chronic illness  INTERVENTION:  D/c Boost Plus (pt does not like) D/c Boost Breeze (pt unable to tolerate citrus juice)  Advance diet to full liquids per discussion with MD via secure chat  Ensure Enlive po BID, each supplement provides 350 kcal and 20 grams of protein (vanilla)  Magic cup TID with meals, each supplement provides 290 kcal and 9 grams of protein (vanilla)  Continue Osmolite 1.5 (237 ml) QID via PEG -120 ml free water every 6 hours   Provides 1420 kcal, 60 grams protein, and 724 ml free water (1204 ml total water with flushes)  Continue to monitor magnesium, potassium, and phosphorus daily for at least 3 days, MD to replete as needed, as pt is at risk for refeeding syndrome given severe malnutrition as well as electrolyte abnormalities on admission   NUTRITION DIAGNOSIS:   Severe Malnutrition related to chronic illness (cancer and related treatments) as evidenced by moderate fat depletion,severe fat depletion,moderate muscle depletion,severe muscle depletion,percent weight loss.    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 Pt admitted for hypotension and pneumonia secondary to COVID-19 infection.  Pt awake, resting in bed after ambulating in room with nurse. Nurse reports pt consumed about half of clear liquid  breakfast tray. Unopened chocolate Boost on counter, pt reports she does not like anything chocolate. She reports continuing to eat/drink orally. States unable to drink juices because they now burn her throat, tolerates ice cold water and endorses 2-3 vanilla Boost daily at home. Pt often eats [employer] broccoli and cheese soup as well as chicken soup from [employer]. She reports making herself scrambled eggs the other day, excited that she could taste them. She endorses 1 carton of Osmolite 1.5/day with 60 ml water before and after feedings. Pt states that she knows she needs to be doing more, does not have time to do 6 feedings with all of her appointments. RD suggested switching to a pump and doing continuous feedings at night, pt declined. Pt also states she is a "foodie" and wants to enjoy eating. RD encouraged po intake as tolerated as well as strategies for increasing calories/protein  Discusssed moderate/severe muscle depletions identfied on NFPE and stressed the importance of adequate nutrition. encouraged pt to increase tube feedings to goal of 6 cartons/day. Pt endorses understanding and is agreeable to working towards nutrition goals.   She recalls usual weight around 160 lb prior to [facility]. Weight 57.5 kg (126.5 lb) on 09/13/19 down 8.8 lbs from 61.5 kg (135.3 lbs) on 08/28/20 and down 22.7 lbs from 67.8 kg (149.16 lbs) on 08/01/20; significant 6.5% in 3 weeks; 15.2% in 6 weeks.  Medications reviewed and include: Lovenox, Methylprednisolone, K Phos, Maxipime, Remdesivir, Vancomycin  Free water 120 ml q6  IVF: D5 and NaCl with KCl 20 mEq @ 125 ml/hr  Labs: K 3.8 (WNL), Mg 2.0 (WNL), WBC 0.9 (L), Hgb 8.5 (L), HCT 24.5 (L)   NUTRITION - FOCUSED PHYSICAL  EXAM:  Flowsheet Row Most Recent Value  Orbital Region Severe depletion  Upper Arm Region Moderate depletion  Thoracic and Lumbar Region Moderate depletion  Buccal Region Severe depletion  Temple Region Moderate depletion  Clavicle Bone Region  Severe depletion  Clavicle and Acromion Bone Region Moderate depletion  Scapular Bone Region Unable to assess  Dorsal Hand Severe depletion  Patellar Region Severe depletion  Anterior Thigh Region Moderate depletion  Posterior Calf Region Severe depletion  Edema (RD Assessment) None  Hair Reviewed  Eyes Reviewed  Mouth Reviewed  Skin Reviewed  Nails Reviewed       Diet Order:   Diet Order            Diet clear liquid Room service appropriate? Yes; Fluid consistency: Thin  Diet effective now                 EDUCATION NEEDS:   Education needs have been addressed  Skin:  Skin Assessment: Reviewed RN Assessment  Last BM:  2/9  Height:   Ht Readings from Last 1 Encounters:  09/12/20 5\' 6"  (1.676 m)    Weight:   Wt Readings from Last 1 Encounters:  09/12/20 57.5 kg    BMI:  Body mass index is 20.46 kg/m.  Estimated Nutritional Needs:   Kcal:  1800-2000  Protein:  90-105  Fluid:  >1.8 L    Lajuan Lines, RD, LDN Clinical Nutrition After Hours/Weekend Pager # in [place]

## 2020-09-13 NOTE — Progress Notes (Signed)
Critical lab value: WBC 1.0, NP made aware via amion.

## 2020-09-13 NOTE — Evaluation (Addendum)
Occupational Therapy Evaluation Patient Details Name: Raven Torres MRN: [id number] DOB: 09-05-1955 Today's Date: 09/13/2020    History of Present Illness Raven Torres is a 66 y.o. 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 Chryl Heck, w/ PEG in place, presented to hospital with dizziness palpitation especially on standing with generalized weakness. Found to be COVID positive.   Clinical Impression   Raven Torres is a 65 year old woman with above pertinent medical history. On evaluation she presents with functional strength, ability to perform bed mobility and ambulation in room without DME and able to perform all ADLs. Patient reports having assistance of daughter at home. O2 sat 92% on RA with activity. No OT needs at this time.    Follow Up Recommendations  No OT follow up    Equipment Recommendations  None recommended by OT    Recommendations for Other Services       Precautions / Restrictions Precautions Precautions: None Restrictions Weight Bearing Restrictions: No      Mobility Bed Mobility Overal bed mobility: Modified Independent                  Transfers Overall transfer level: Needs assistance   Transfers: Sit to/from Stand           General transfer comment: min guard for ambulation in room for safety and assistance with IV pole. No physical assistance needed.    Balance Overall balance assessment: No apparent balance deficits (not formally assessed)                                         ADL either performed or assessed with clinical judgement   ADL Overall ADL's : At baseline                                             Vision Patient Visual Report: No change from baseline       Perception     Praxis      Pertinent  Vitals/Pain Pain Assessment: No/denies pain     Hand Dominance Left   Extremity/Trunk Assessment Upper Extremity Assessment Upper Extremity Assessment: Overall WFL for tasks assessed   Lower Extremity Assessment Lower Extremity Assessment: Defer to PT evaluation   Cervical / Trunk Assessment Cervical / Trunk Assessment: Normal   Communication Communication Communication: Expressive difficulties (hoarse voice)   Cognition Arousal/Alertness: Awake/alert Behavior During Therapy: WFL for tasks assessed/performed Overall Cognitive Status: Within Functional Limits for tasks assessed                                     General Comments       Exercises     Shoulder Instructions      Home Living Family/patient expects to be discharged to:: Private residence Living Arrangements: Children;Other relatives (daughter and 2 grandkids) Available Help at Discharge: Family;Available PRN/intermittently Type of Home: House Home Access: Stairs to enter [employer] of Steps: 3-4 Entrance Stairs-Rails: Right;Left Home Layout: One level     Bathroom Shower/Tub: Teacher, early years/pre: Standard  Home Equipment: Shower seat;Cane - single point   Additional Comments: has ordered a shower seat      Prior Functioning/Environment Level of Independence: Independent        Comments: Pt reports independent with ADLs, community ambulation without AD, drives.        OT Problem List: Decreased strength;Decreased activity tolerance      OT Treatment/Interventions:      OT Goals(Current goals can be found in the care plan section) Acute Rehab OT Goals Patient Stated Goal: To go home OT Goal Formulation: All assessment and education complete, DC therapy  OT Frequency:     Barriers to D/C:            Co-evaluation              AM-PAC OT "6 Clicks" Daily Activity     Outcome Measure Help from another person eating meals?: None Help  from another person taking care of personal grooming?: None Help from another person toileting, which includes using toliet, bedpan, or urinal?: None Help from another person bathing (including washing, rinsing, drying)?: None Help from another person to put on and taking off regular upper body clothing?: None Help from another person to put on and taking off regular lower body clothing?: None 6 Click Score: 24   End of Session Nurse Communication: Mobility status  Activity Tolerance: Patient tolerated treatment well Patient left: in chair;with call bell/phone within reach  OT Visit Diagnosis: Muscle weakness (generalized) (M62.81)                Time: 8295-6213 OT Time Calculation (min): 17 min Charges:  OT General Charges $OT Visit: 1 Visit OT Evaluation $OT Eval Low Complexity: 1 Low  Bufford Helms, OTR/L [place]  Office [phone] Pager: [place] 09/13/2020, 11:01 AM

## 2020-09-13 NOTE — Evaluation (Signed)
Clinical/Bedside Swallow Evaluation Patient Details  Name: Raven Torres MRN: [id number] Date of Birth: 1956-07-08  Today's Date: 09/13/2020 Time: SLP Start Time (ACUTE ONLY): 8099 SLP Stop Time (ACUTE ONLY): 1735 SLP Time Calculation (min) (ACUTE ONLY): 30 min  Past Medical History:  Past Medical History:  Diagnosis Date  . Arthritis   . Cataract    removed both eyes   . Colitis   . COPD (chronic obstructive pulmonary disease) ([place])   . Hepatitis C   . Hyperlipidemia   . Hypertension   . Shingles 03/27/2020  . TB (tuberculosis)    treatment    Past Surgical History:  Past Surgical History:  Procedure Laterality Date  . ABDOMINAL HYSTERECTOMY    . CATARACT EXTRACTION, BILATERAL    . COLONOSCOPY    . COLONOSCOPY  04/04/2020  . DIRECT LARYNGOSCOPY N/A 06/26/2020   Procedure: DIRECT LARYNGOSCOPY AND BIOPSY;  Surgeon: Rozetta Nunnery, MD;  Location: [facility];  Service: ENT;  Laterality: N/A;  . IR GASTROSTOMY TUBE MOD SED  07/27/2020  . IR IMAGING GUIDED PORT INSERTION  07/27/2020  . KNEE SURGERY Right   . POLYPECTOMY  11/17/2008   HPP x 1    HPI:  Pt admitted from cancer center after treatment due to her hypotension.  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 06-28-20 PET confirmed malignancy with nodal involvement on rt, without metastasis  .PT with PEG placement 07-27-20, and began rad to epiglottis and bil neck 12 20-21 and chemo on 07-25-21.  She is undergoing concurrent radiation and chemo tx - s/p 29th treatment,  has h/o COPD, smoker x50 years, left lung base opacity - ? Pna, FTT, odynophagia from XRT has PEG - viscous saliva, oropharyngeal dysphagia, ? Mouth ulcers?   Swallow eval ordered.  Pt reports throat soreness but denies significant dysphagia. At home she consumes soups mostly.  She did not participate in XRT today due to stating  she didn't feel good.  Pt reports she has been following the exercises Glendell Docker has provided to her - head turning right/left and lingual press.  She has lidocaine ordered for pharyngeal pain.   Assessment / Plan / Recommendation Clinical Impression  Pt with known acute exacerbation of dysphagia due to her laryngeal/epiglottic cancer treatment.  Her voice is hoarse but strong cough noted with pt being able to expectorate viscous secretions.  Pt noted with blistering on lips right more than left limited her labial ROM.  She also admits to issues her jaw "popping" at times concerning for TMJ. SLP advised pt speak to Dr Isidore Moos re: potential for trismus device or jaw exercise against resistance due to concern for jaw integrity.  Pt able to perform dry swallow on command.  Appears with minimal erythema on neck from sloughing of tissue d/t XRT.  Observed pt consuming thin coffee.  She uses caution with consumption taking small boluses but was observed to swallow with HOB lowered.  Advised she consume intake when fully upright.  Reviewed importance of conducting exercises Carl, OP SLP, provided to her- most notably doing lingual press and effortful swallow with head rotation.  No SLP follow up indicated at this time as pt is managing her dysphagia from her XRT.  Encouraged intake - especially making sure liquids are consumed to keep secretions at thin as able.  Mucositis and xerostomia strategies given in writing.  Thanks for  this consult. SLP Visit Diagnosis: Dysphagia, pharyngeal phase (R13.13)    Aspiration Risk  Moderate aspiration risk    Diet Recommendation Other (Comment);Thin liquid (full liquids)   Liquid Administration via: Straw;Cup Medication Administration: Other (Comment) (with puree - if problematic - crushed or suspension) Supervision: Patient able to self feed Compensations: Slow rate;Small sips/bites Postural Changes: Seated upright at 90 degrees;Remain upright for at least 30 minutes after po  intake    Other  Recommendations Oral Care Recommendations: Oral care QID   Follow up Recommendations None      Frequency and Duration   n/a         Prognosis   n/a     Swallow Study   General Date of Onset: 09/13/20 HPI: Pt admitted from cancer center after treatment due to her hypotension.  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 06-28-20 PET confirmed malignancy with nodal involvement on rt, without metastasis  .PT with PEG placement 07-27-20, and began rad to epiglottis and bil neck 12 20-21 and chemo on 07-25-21.  She is undergoing concurrent radiation and chemo tx - s/p 29th treatment,  has h/o COPD, smoker x50 years, left lung base opacity - ? Pna, FTT, odynophagia from XRT has PEG - viscous saliva, oropharyngeal dysphagia, ? Mouth ulcers?   Swallow eval ordered.  Pt reports throat soreness but denies significant dysphagia. At home she consumes soups mostly.  She did not participate in XRT today due to stating she didn't feel good.  Pt reports she has been following the exercises Glendell Docker has provided to her - head turning right/left and lingual press.  She has lidocaine ordered for pharyngeal pain. Type of Study: Bedside Swallow Evaluation Previous Swallow Assessment: pt saw Glendell Docker as an OP 08/2020 and HEP initiated Diet Prior to this Study: Thin liquids (full liquids) Temperature Spikes Noted: No Respiratory Status: Room air History of Recent Intubation: No Behavior/Cognition: Alert;Cooperative;Pleasant mood Oral Cavity Assessment: Within Functional Limits;Other (comment) (appears with blistering on lips - intraoral region [facility]) Oral Care Completed by SLP: No Oral Cavity - Dentition:  (dentures - advised pt to remove dentures nightly and brush her gums with soft bristle brush) Vision: Functional for self-feeding Self-Feeding Abilities: Able to feed  self Patient Positioning: Upright in bed Baseline Vocal Quality: Hoarse Volitional Cough: Strong;Other (Comment);Congested (productive) Volitional Swallow: Able to elicit    Oral/Motor/Sensory Function Overall Oral Motor/Sensory Function: Mild impairment (mildly decreased ROM, ? if she has TMJ - reports recurrent jaw issues)   Ice Chips Ice chips: Not tested   Thin Liquid Thin Liquid: Within functional limits Presentation: Cup;Straw Other Comments: intermittent throat clearing noted after swallowing but also at baseline due to amount of secretions retained in oral cavity    Nectar Thick Nectar Thick Liquid: Not tested   Honey Thick Honey Thick Liquid: Not tested   Puree Puree: Not tested Other Comments: pt did not desire to consume ice cream stating it was "too creamy"   Solid     Solid: Not tested      [name] 09/13/2020,5:50 PM  [name], MS [place] Office [phone] Pager [phone]

## 2020-09-13 NOTE — Progress Notes (Signed)
PROGRESS NOTE  Raven Torres QIW:[id number] DOB: 02-Jul-1956 DOA: 09/12/2020 PCP: Ladell Pier, MD   LOS: 1 day   Brief narrative:  Raven Torres is a 65 y.o. 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 Chryl Heck, w/ PEG in place, presented to hospital with dizziness palpitation especially on standing with generalized weakness.  Patient was noted to be orthostatic in the radiation treatment area on the day of presentation and was sent to the hospital.  In the ED patient was noted to have hypokalemia, hypomagnesemia leukopenia, thrombocytopenia , CXR-opacity in the left lung atelectasis versus pneumonia. Patient was given 1 L bolus additionally, placed on vancomycin and cefepime and  was admitted to hospital for further evaluation and treatment.  Assessment/Plan:  Principal Problem:   Hypotension Active Problems:   Chronic hepatitis C virus infection (HCC)   Rheumatoid arthritis (HCC)   HTN (hypertension)   Malignant neoplasm of supraglottis (HCC)  Orthostatic hypotension on presentation.  Received IV fluid bolus.  Continue to monitor closely.  Currently on D5 one fourth normal saline with 20 mEq of potassium.  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  We will get nutrition services.  Hypomagnesemia/hypokalemia  improved after replacement  Left lower base pneumonia : Procalcitonin less than 0.1.  Patient has significant leukopenia and strep urinary antigen is positive.  Currently on vancomycin and cefepime.  Follow blood cultures.  Continue current regimen given  Neutropenia.  In view of chemoradiation immune suppression we will continue with broad-spectrum biotics for now until cultures are back.  Possible Covid/strep pneumonia.  Strep antigen is positive.  Has left basal pneumonia.   Continue on remdesivir, IV steroids and broad-spectrum antibiotic for now.  Acute hypoxic respiratory failure, saturation of 87% on room air on presentation.  Currently on 2 L of oxygen by nasal cannula  Hypertension- antihypertensives on hold.  Continue to monitor orthostatic vitals  Rheumatoid arthritis.  Continue supportive care as needed  Hyperlipidemia continue statin  Chronic Hep c w/ liver cirrhosis: Compensated.   COPD/chronic emphysema  patient is not on home oxygen.  And history of latent TB treated 3 years ago.  Not currently in exacerbation.  Briefly required 2 L of oxygen by nasal cannula.  Will wean as able.    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 Chryl Heck, and Rad Onc.    Pancytopenia with anemia, thrombocytopenia and leukopenia.  Currently neutropenic.  We will continue to monitor.  Patient is on broad-spectrum antibiotic  DVT prophylaxis: enoxaparin (LOVENOX) injection 40 mg Start: 09/13/20 1000   Code Status: DNR  Family Communication: I spoke with the patient's daughter and on the phone and updated her about the clinical condition of the patient  Status is: Inpatient  Remains inpatient appropriate because:IV treatments appropriate due to intensity of illness or inability to take PO and Inpatient level of care appropriate due to severity of illness  Dispo: The patient is from: Home              Anticipated d/c is to: Home              Anticipated d/c date is: 2 days              Patient currently is not medically stable to d/c.   Difficult to place patient No   Consultants:  None so far  Procedures:  None  Anti-infectives:  . Remdesivir,Vancomycin,Cefepime 2/8>  Anti-infectives (From admission, onward)   Start     Dose/Rate Route Frequency Ordered Stop   09/13/20 1000  remdesivir 100 mg in sodium chloride 0.9 % 100 mL IVPB       "Followed by" Linked Group Details   100 mg 200 mL/hr over 30 Minutes  Intravenous Daily 09/12/20 2156 09/17/20 0959   09/13/20 0600  vancomycin (VANCOREADY) IVPB 500 mg/100 mL        500 mg 100 mL/hr over 60 Minutes Intravenous Every 12 hours 09/12/20 1830     09/13/20 0000  ceFEPIme (MAXIPIME) 2 g in sodium chloride 0.9 % 100 mL IVPB        2 g 200 mL/hr over 30 Minutes Intravenous Every 8 hours 09/12/20 1830     09/12/20 2300  remdesivir 200 mg in sodium chloride 0.9% 250 mL IVPB       "Followed by" Linked Group Details   200 mg 580 mL/hr over 30 Minutes Intravenous Once 09/12/20 2156 09/12/20 2337   09/12/20 1630  vancomycin (VANCOREADY) IVPB 1250 mg/250 mL        1,250 mg 166.7 mL/hr over 90 Minutes Intravenous  Once 09/12/20 1616 09/12/20 2010   09/12/20 1615  ceFEPIme (MAXIPIME) 2 g in sodium chloride 0.9 % 100 mL IVPB        2 g 200 mL/hr over 30 Minutes Intravenous  Once 09/12/20 1610 09/12/20 1735     Subjective: Today, patient was seen and examined at bedside. Today, patient stated that her strength has improved. Has been better with breathing.   Objective: Vitals:   09/13/20 0219 09/13/20 0619  BP: (!) 124/52 (!) 119/58  Pulse: 85 85  Resp: 19 19  Temp: 98.3 F (36.8 C) 98.4 F (36.9 C)  SpO2: 92% 95%    Intake/Output Summary (Last 24 hours) at 09/13/2020 0750 Last data filed at 09/13/2020 0600 Gross per 24 hour  Intake 500 ml  Output 600 ml  Net -100 ml   Filed Weights   09/12/20 2219  Weight: 57.5 kg   Body mass index is 20.46 kg/m.   Physical Exam:  GENERAL: Patient is alert awake and oriented. Not in obvious distress.  Frail HENT: No scleral pallor or icterus. Pupils equally reactive to light. Oral mucosa is moist NECK: is supple, no gross swelling noted. CHEST:  Diminished breath sounds bilaterally.  Right chest wall port in place CVS: S1 and S2 heard, no murmur. Regular rate and rhythm.  ABDOMEN: Soft, non-tender, bowel sounds are present.  PEG tube in place. EXTREMITIES: No edema. CNS: Cranial nerves are intact. No  focal motor deficits. SKIN: warm and dry without rashes.  Data Review: I have personally reviewed the following laboratory data and studies,  CBC: Recent Labs  Lab 09/07/20 1311 09/12/20 1452 09/13/20 0034 09/13/20 0308  WBC 0.7* 1.5* 1.0* 0.9*  NEUTROABS 0.4* 0.6*  --  0.5*  HGB 10.2* 9.6* 8.1* 8.5*  HCT 30.6* 27.1* 23.8* 24.5*  MCV 89.5 92.2 93.0 92.1  PLT 37* 99* 82* 87*   Basic Metabolic Panel: Recent Labs  Lab 09/07/20 1311 09/12/20 1452 09/13/20 0034 09/13/20 0308  NA 141 139  --  136  K 3.0* 3.4*  --  3.8  CL 104 103  --  105  CO2 29 24  --  24  GLUCOSE 123* 90  --  165*  BUN 15 19  --  13  CREATININE 0.61 0.49 0.55 0.60  CALCIUM 8.4* 8.3*  --  7.4*  MG 2.5* 1.4*  --  2.0   Liver Function Tests: Recent Labs  Lab 09/12/20 1452 09/13/20 0308  AST 22 20  ALT 13 13  ALKPHOS 45 40  BILITOT 1.0 0.7  PROT 6.0* 5.3*  ALBUMIN 2.5* 2.3*   No results for input(s): LIPASE, AMYLASE in the last 168 hours. No results for input(s): AMMONIA in the last 168 hours. Cardiac Enzymes: No results for input(s): CKTOTAL, CKMB, CKMBINDEX, TROPONINI in the last 168 hours. BNP (last 3 results) No results for input(s): BNP in the last 8760 hours.  ProBNP (last 3 results) No results for input(s): PROBNP in the last 8760 hours.  CBG: No results for input(s): GLUCAP in the last 168 hours. Recent Results (from the past 240 hour(s))  Resp Panel by RT-PCR (Flu A&B, Covid) Nasopharyngeal Swab     Status: Abnormal   Collection Time: 09/12/20  3:54 PM   Specimen: Nasopharyngeal Swab; Nasopharyngeal(NP) swabs in vial transport medium  Result Value Ref Range Status   SARS Coronavirus 2 by RT PCR POSITIVE (A) NEGATIVE Final    Comment: RESULT CALLED TO, READ BACK BY AND VERIFIED WITH: BROOKS,B. R @1944  ON 02.08.2022 BY COHEN,K (NOTE) SARS-CoV-2 target nucleic acids are DETECTED.  The SARS-CoV-2 RNA is generally detectable in upper respiratory specimens during the acute phase of  infection. Positive results are indicative of the presence of the identified virus, but do not rule out bacterial infection or co-infection with other pathogens not detected by the test. Clinical correlation with patient history and other diagnostic information is necessary to determine patient infection status. The expected result is Negative.  Fact Sheet for Patients: EntrepreneurPulse.com.au  Fact Sheet for Healthcare Providers: IncredibleEmployment.be  This test is not yet approved or cleared by the [place] FDA and  has been authorized for detection and/or diagnosis of SARS-CoV-2 by FDA under an Emergency Use Authorization (EUA).  This EUA will remain in effect (meaning this te st can be used) for the duration of  the COVID-19 declaration under Section 564(b)(1) of the Act, 21 U.S.C. section 360bbb-3(b)(1), unless the authorization is terminated or revoked sooner.     Influenza A by PCR NEGATIVE NEGATIVE Final   Influenza B by PCR NEGATIVE NEGATIVE Final    Comment: (NOTE) The Xpert Xpress SARS-CoV-2/FLU/RSV plus assay is intended as an aid in the diagnosis of influenza from Nasopharyngeal swab specimens and should not be used as a sole basis for treatment. Nasal washings and aspirates are unacceptable for Xpert Xpress SARS-CoV-2/FLU/RSV testing.  Fact Sheet for Patients: EntrepreneurPulse.com.au  Fact Sheet for Healthcare Providers: IncredibleEmployment.be  This test is not yet approved or cleared by the [place] FDA and has been authorized for detection and/or diagnosis of SARS-CoV-2 by FDA under an Emergency Use Authorization (EUA). This EUA will remain in effect (meaning this test can be used) for the duration of the COVID-19 declaration under Section 564(b)(1) of the Act, 21 U.S.C. section 360bbb-3(b)(1), unless the authorization is terminated or revoked.  Performed at [facility], [place] [address], [place], [place] [address]   Culture, sputum-assessment     Status: None   Collection Time: 09/13/20  4:29 AM   Specimen: Expectorated Sputum  Result Value Ref Range Status   Specimen Description EXPECTORATED SPUTUM  Final   Special Requests NONE  Final   Sputum evaluation   Final    THIS SPECIMEN IS ACCEPTABLE FOR SPUTUM  CULTURE Performed at [facility], [place] [address], [place], [place] [address]    Report Status 09/13/2020 FINAL  Final     Studies: DG Chest Port 1 View  Result Date: 09/12/2020 CLINICAL DATA:  Patient with supraglottic cancer with failure to thrive. EXAM: PORTABLE CHEST 1 VIEW COMPARISON:  Chest CT January 20, 2020. FINDINGS: Accessed right chest porta catheter with tip overlying the superior cavoatrial junction. The heart size and mediastinal contours are within normal limits. Aortic atherosclerosis. Emphysematous lung changes. Opacity in the left lung base. The visualized skeletal structures are unremarkable. IMPRESSION: 1. Opacity in the left lung base which could represent atelectasis versus pneumonia. 2. Aortic Atherosclerosis (ICD10-I70.0) and Emphysema (ICD10-J43.9). Electronically Signed   By: Dahlia Bailiff MD   On: 09/12/2020 15:22      Flora Lipps, MD  [facility] 09/13/2020  If 7PM-7AM, please contact night-coverage

## 2020-09-13 NOTE — Evaluation (Signed)
Physical Therapy Evaluation Only Patient Details Name: Raven Torres MRN: [id number] DOB: [birth year] Today's Date: 09/13/2020   History of Present Illness  Raven Torres is a 65 y.o. 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 Chryl Heck, w/ PEG in place, presented to hospital with dizziness palpitation especially on standing with generalized weakness. Found to be COVID positive.  Clinical Impression  Pt able to ambulate in room without assist or AD, therapist managing IV pole and and lines for safety only. Pt completes 5x STS in 16 sec without SOB noted. Therapist educated pt on mobility while in hospital, educated on HEP (LAQ, marching, STS) and pt able to perform correctly, and appropriate SpO2 values. Pt verbalizes understanding. Pt independent PTA with good family support at home. No acute PT needs identified at this time and pt in agreement.    Follow Up Recommendations No PT follow up    Equipment Recommendations  None recommended by PT    Recommendations for Other Services       Precautions / Restrictions Precautions Precautions: None Restrictions Weight Bearing Restrictions: No      Mobility  Bed Mobility Overal bed mobility: Modified Independent  General bed mobility comments: in chair upon arrival- per OT pt is modified independent    Transfers Overall transfer level: Modified independent Equipment used: None Transfers: Sit to/from Stand  General transfer comment: no physical assist or cues with transfers  Ambulation/Gait Ambulation/Gait assistance: Supervision Gait Distance (Feet): 20 Feet Assistive device: None Gait Pattern/deviations: WFL(Within Functional Limits) Gait velocity: slightly decreased   General Gait Details: pt ambulates around room without AD or physical assistance, therapist  managing IV pole and lines for safety only  Stairs            Wheelchair Mobility    Modified Rankin (Stroke Patients Only)       Balance Overall balance assessment: No apparent balance deficits (not formally assessed)          Pertinent Vitals/Pain Pain Assessment: No/denies pain    Home Living Family/patient expects to be discharged to:: Private residence Living Arrangements: Children;Other relatives (daughter and 2 grandkids) Available Help at Discharge: Family;Available PRN/intermittently Type of Home: House Home Access: Stairs to enter Entrance Stairs-Rails: Psychiatric nurse of Steps: 3-4 Home Layout: One level Home Equipment: Shower seat;Cane - single point Additional Comments: has ordered a shower seat    Prior Function Level of Independence: Independent         Comments: Pt reports independent with ADLs, community ambulation without AD, drives.     Hand Dominance   Dominant Hand: Left    Extremity/Trunk Assessment   Upper Extremity Assessment Upper Extremity Assessment: Defer to OT evaluation    Lower Extremity Assessment Lower Extremity Assessment: Overall WFL for tasks assessed (AROM WNL, strength 4+/5 throughout BLE)    Cervical / Trunk Assessment Cervical / Trunk Assessment: Normal  Communication   Communication: Expressive difficulties (hoarse voice)  Cognition Arousal/Alertness: Awake/alert Behavior During Therapy: WFL for tasks assessed/performed Overall Cognitive Status: Within Functional Limits for tasks assessed       General Comments General comments (skin integrity, edema, etc.): Pt on RA with SpO2 92-94% with all mobility, no SOB noted.    Exercises General Exercises - Lower Extremity Long Arc Quad: Seated;AROM;Strengthening;Both;5 reps Hip Flexion/Marching: Seated;AROM;Strengthening;Both;5 reps Other Exercises Other Exercises: 5x STS: 16 sec from chair, no  AD   Assessment/Plan    PT Assessment  Patent does not need any further PT services  PT Problem List         PT Treatment Interventions      PT Goals (Current goals can be found in the Care Plan section)  Acute Rehab PT Goals Patient Stated Goal: To go home PT Goal Formulation: With patient Time For Goal Achievement: 09/27/20 Potential to Achieve Goals: Good    Frequency     Barriers to discharge        Co-evaluation               AM-PAC PT "6 Clicks" Mobility  Outcome Measure Help needed turning from your back to your side while in a flat bed without using bedrails?: None Help needed moving from lying on your back to sitting on the side of a flat bed without using bedrails?: None Help needed moving to and from a bed to a chair (including a wheelchair)?: None Help needed standing up from a chair using your arms (e.g., wheelchair or bedside chair)?: None Help needed to walk in hospital room?: None Help needed climbing 3-5 steps with a railing? : None 6 Click Score: 24    End of Session   Activity Tolerance: Patient tolerated treatment well Patient left: in chair;with call bell/phone within reach Nurse Communication: Mobility status PT Visit Diagnosis: Other abnormalities of gait and mobility (R26.89)    Time: 0037-0488 PT Time Calculation (min) (ACUTE ONLY): 10 min   Charges:   PT Evaluation $PT Eval Low Complexity: 1 Low          Tori Gage Treiber PT, DPT 09/13/20, 11:23 AM

## 2020-09-13 NOTE — Plan of Care (Signed)
  Problem: Education: Goal: Knowledge of General Education information will improve Description: Including pain rating scale, medication(s)/side effects and non-pharmacologic comfort measures Outcome: Progressing   Problem: Clinical Measurements: Goal: Will remain free from infection Outcome: Progressing Goal: Diagnostic test results will improve Outcome: Progressing Goal: Respiratory complications will improve Outcome: Progressing   Problem: Nutrition: Goal: Adequate nutrition will be maintained Outcome: Progressing   Problem: Coping: Goal: Level of anxiety will decrease Outcome: Progressing   Problem: Safety: Goal: Ability to remain free from injury will improve Outcome: Progressing   Problem: Skin Integrity: Goal: Risk for impaired skin integrity will decrease Outcome: Progressing

## 2020-09-14 ENCOUNTER — Ambulatory Visit: Payer: Medicare Other

## 2020-09-14 ENCOUNTER — Telehealth: Payer: Self-pay | Admitting: Emergency Medicine

## 2020-09-14 DIAGNOSIS — E43 Unspecified severe protein-calorie malnutrition: Secondary | ICD-10-CM

## 2020-09-14 LAB — CBC WITH DIFFERENTIAL/PLATELET
Abs Immature Granulocytes: 0.08 10*3/uL — ABNORMAL HIGH (ref 0.00–0.07)
Basophils Absolute: 0 10*3/uL (ref 0.0–0.1)
Basophils Relative: 0 %
Eosinophils Absolute: 0 10*3/uL (ref 0.0–0.5)
Eosinophils Relative: 0 %
HCT: 25.5 % — ABNORMAL LOW (ref 36.0–46.0)
Hemoglobin: 8.9 g/dL — ABNORMAL LOW (ref 12.0–15.0)
Immature Granulocytes: 4 %
Lymphocytes Relative: 19 %
Lymphs Abs: 0.4 10*3/uL — ABNORMAL LOW (ref 0.7–4.0)
MCH: 33 pg (ref 26.0–34.0)
MCHC: 34.9 g/dL (ref 30.0–36.0)
MCV: 94.4 fL (ref 80.0–100.0)
Monocytes Absolute: 0.5 10*3/uL (ref 0.1–1.0)
Monocytes Relative: 23 %
Neutro Abs: 1.1 10*3/uL — ABNORMAL LOW (ref 1.7–7.7)
Neutrophils Relative %: 54 %
Platelets: 113 10*3/uL — ABNORMAL LOW (ref 150–400)
RBC: 2.7 MIL/uL — ABNORMAL LOW (ref 3.87–5.11)
RDW: 17.3 % — ABNORMAL HIGH (ref 11.5–15.5)
WBC: 2 10*3/uL — ABNORMAL LOW (ref 4.0–10.5)
nRBC: 1 % — ABNORMAL HIGH (ref 0.0–0.2)

## 2020-09-14 LAB — COMPREHENSIVE METABOLIC PANEL
ALT: 13 U/L (ref 0–44)
AST: 24 U/L (ref 15–41)
Albumin: 2.3 g/dL — ABNORMAL LOW (ref 3.5–5.0)
Alkaline Phosphatase: 46 U/L (ref 38–126)
Anion gap: 8 (ref 5–15)
BUN: 10 mg/dL (ref 8–23)
CO2: 26 mmol/L (ref 22–32)
Calcium: 8.5 mg/dL — ABNORMAL LOW (ref 8.9–10.3)
Chloride: 104 mmol/L (ref 98–111)
Creatinine, Ser: 0.52 mg/dL (ref 0.44–1.00)
GFR, Estimated: >60 mL/min (ref >60)
Glucose, Bld: 137 mg/dL — ABNORMAL HIGH (ref 70–99)
Potassium: 4.4 mmol/L (ref 3.5–5.1)
Sodium: 138 mmol/L (ref 135–145)
Total Bilirubin: 0.5 mg/dL (ref 0.3–1.2)
Total Protein: 5.5 g/dL — ABNORMAL LOW (ref 6.5–8.1)

## 2020-09-14 LAB — PROCALCITONIN: Procalcitonin: <0.1 ng/mL

## 2020-09-14 LAB — D-DIMER, QUANTITATIVE: D-Dimer, Quant: 11.08 ug/mL-FEU — ABNORMAL HIGH (ref 0.00–0.50)

## 2020-09-14 LAB — LEGIONELLA PNEUMOPHILA SEROGP 1 UR AG: L. pneumophila Serogp 1 Ur Ag: NEGATIVE

## 2020-09-14 LAB — C-REACTIVE PROTEIN: CRP: 2.8 mg/dL — ABNORMAL HIGH (ref <1.0)

## 2020-09-14 MED ORDER — MAGIC MOUTHWASH
10.0000 mL | Freq: Four times a day (QID) | ORAL | Status: DC | PRN
  Filled 2020-09-14 (×2): qty 10

## 2020-09-14 NOTE — Progress Notes (Signed)
PROGRESS NOTE  Raven Torres AST:[id number] DOB: [birth year] DOA: 09/12/2020 PCP: Ladell Pier, MD   LOS: 2 days   Brief narrative:  Raven Torres is a 65 y.o. 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 Chryl Heck, w/ PEG in place, presented to hospital with dizziness palpitation especially on standing with generalized weakness.  Patient was noted to be orthostatic in the radiation treatment area on the day of presentation and was sent to the hospital.  In the ED patient was noted to have hypokalemia, hypomagnesemia leukopenia, thrombocytopenia , CXR-opacity in the left lung atelectasis versus pneumonia. Patient was given 1 L bolus additionally, placed on vancomycin and cefepime and  was admitted to hospital for further evaluation and treatment.  Assessment/Plan:  Principal Problem:   Hypotension Active Problems:   Chronic hepatitis C virus infection (HCC)   Rheumatoid arthritis (HCC)   HTN (hypertension)   Malignant neoplasm of supraglottis (HCC)   Protein-calorie malnutrition, severe  Orthostatic hypotension on presentation.  Received IV fluid bolus and IV fluids.  Will check orthostatic vitals.  Discontinue IV fluids.  Failure to thrive, severe protein calorie malnutrition:  Present on admission.  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  He services on board.  Continue Ensure Magic cup and Osmolite.  Hypomagnesemia/hypokalemia  improved after replacement.  Continue to monitor  Left lower base pneumonia : Strep and Covid positive.  Procalcitonin less than 0.1x2 but urinary antigen is positive for strep.  Will discontinue vancomycin since MRSA screen is negative.  Continue cefepime for now.  Improving leukopenia and neutropenia.  Cultures negative in 2 days.  Continue remdesivir, IV steroids.   Check inflammatory markers  COVID-19 Labs  Recent Labs    09/13/20 0034 09/13/20 0308 09/14/20 0502  DDIMER 9.36* 9.76* 11.08*  FERRITIN 983*  --   --   CRP 4.3* 4.4* 2.8*    Lab Results  Component Value Date   SARSCOV2NAA POSITIVE (A) 09/12/2020   [place] NEGATIVE 07/24/2020   [place] NEGATIVE 06/26/2020   [place] Not Detected 04/28/2020     Acute hypoxic respiratory failure, secondary to pneumonia saturation of 87% on room air on presentation.  Currently on room air.  We will continue to monitor closely.  Essential hypertension- antihypertensives on hold.    Rheumatoid arthritis.  Continue supportive care as needed.  No acute flare of pain at this time.  Hyperlipidemia continue statin  Chronic Hep c w/ liver cirrhosis: Compensated.  LFTs within normal range except for low albumin  COPD/chronic emphysema  patient is not on home oxygen.  Patient with history of latent TB treated 3 years ago.  Not currently in exacerbation.  Briefly required 2 L of oxygen by nasal cannula.  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 Chryl Heck, and Rad Onc.    Pancytopenia with anemia, thrombocytopenia and leukopenia.  Currently mildly neutropenic.  We will continue to monitor.  Patient is on cefepime.  DVT prophylaxis: enoxaparin (LOVENOX) injection 40 mg Start: 09/13/20 1000   Code Status: DNR  Family Communication:  None today.  Unable to reach her.  I spoke with the patient's daughter Ms [name]  on the phone and updated her about the clinical condition of the yesterday.  Status is: Inpatient  Remains inpatient appropriate because:IV treatments appropriate due to intensity of illness or inability to  take PO and Inpatient level of care appropriate due to severity of illness, pneumonia on IV antibiotics, immunocompromised host  Dispo: The patient is from: Home              Anticipated d/c is to: Home               Anticipated d/c date is: 2 days              Patient currently is not medically stable to d/c.   Difficult to place patient No   Consultants:  None   Procedures:  None  Anti-infectives:  . Remdesivir 2/8> . Vancomycin, 2/8> 2/10 . Cefepime 2/8>  Anti-infectives (From admission, onward)   Start     Dose/Rate Route Frequency Ordered Stop   09/13/20 1000  remdesivir 100 mg in sodium chloride 0.9 % 100 mL IVPB       "Followed by" Linked Group Details   100 mg 200 mL/hr over 30 Minutes Intravenous Daily 09/12/20 2156 09/17/20 0959   09/13/20 0600  vancomycin (VANCOREADY) IVPB 500 mg/100 mL  Status:  Discontinued        500 mg 100 mL/hr over 60 Minutes Intravenous Every 12 hours 09/12/20 1830 09/14/20 1305   09/13/20 0000  ceFEPIme (MAXIPIME) 2 g in sodium chloride 0.9 % 100 mL IVPB        2 g 200 mL/hr over 30 Minutes Intravenous Every 8 hours 09/12/20 1830     09/12/20 2300  remdesivir 200 mg in sodium chloride 0.9% 250 mL IVPB       "Followed by" Linked Group Details   200 mg 580 mL/hr over 30 Minutes Intravenous Once 09/12/20 2156 09/12/20 2337   09/12/20 1630  vancomycin (VANCOREADY) IVPB 1250 mg/250 mL        1,250 mg 166.7 mL/hr over 90 Minutes Intravenous  Once 09/12/20 1616 09/12/20 2010   09/12/20 1615  ceFEPIme (MAXIPIME) 2 g in sodium chloride 0.9 % 100 mL IVPB        2 g 200 mL/hr over 30 Minutes Intravenous  Once 09/12/20 1610 09/12/20 1735     Subjective: Today, patient was seen and examined at bedside.  Patient states that her breathing has improved.  Denies any nausea vomiting fever or chills.  Denies chest pain, overt sputum production. Patient states that she has diarrhea with tube feeding.  Objective: Vitals:   09/14/20 0528 09/14/20 1351  BP: (!) 114/52 116/69  Pulse: 86 77  Resp: 17 16  Temp: 98.3 F (36.8 C) 99 F (37.2 C)  SpO2: 98% 97%    Intake/Output Summary (Last 24 hours) at 09/14/2020 1429 Last data filed at 09/14/2020 0600 Gross per  24 hour  Intake 170 ml  Output -  Net 170 ml   Filed Weights   09/12/20 2219  Weight: 57.5 kg   Body mass index is 20.46 kg/m.   Physical Exam:  GENERAL: Patient is alert awake and oriented. Not in obvious distress.  Frail appearing, mildly anxious, thinly built HENT: No scleral pallor or icterus. Pupils equally reactive to light. Oral mucosa is moist, hoarse voice NECK: is supple, no gross swelling noted. CHEST:  Diminished breath sounds bilaterally.  Right chest wall port in place CVS: S1 and S2 heard, no murmur. Regular rate and rhythm.  ABDOMEN: Soft, non-tender, bowel sounds are present.  PEG tube in place without induration erythema. EXTREMITIES: No edema. CNS: Cranial nerves are intact. No focal motor deficits. SKIN: warm and dry without rashes.  Data Review: I have personally reviewed the following laboratory data and studies,  CBC: Recent Labs  Lab 09/12/20 1452 09/13/20 0034 09/13/20 0308 09/14/20 0502  WBC 1.5* 1.0* 0.9* 2.0*  NEUTROABS 0.6*  --  0.5* 1.1*  HGB 9.6* 8.1* 8.5* 8.9*  HCT 27.1* 23.8* 24.5* 25.5*  MCV 92.2 93.0 92.1 94.4  PLT 99* 82* 87* 725*   Basic Metabolic Panel: Recent Labs  Lab 09/12/20 1452 09/13/20 0034 09/13/20 0308 09/14/20 0502  NA 139  --  136 138  K 3.4*  --  3.8 4.4  CL 103  --  105 104  CO2 24  --  24 26  GLUCOSE 90  --  165* 137*  BUN 19  --  13 10  CREATININE 0.49 0.55 0.60 0.52  CALCIUM 8.3*  --  7.4* 8.5*  MG 1.4*  --  2.0  --    Liver Function Tests: Recent Labs  Lab 09/12/20 1452 09/13/20 0308 09/14/20 0502  AST 22 20 24   ALT 13 13 13   ALKPHOS 45 40 46  BILITOT 1.0 0.7 0.5  PROT 6.0* 5.3* 5.5*  ALBUMIN 2.5* 2.3* 2.3*   No results for input(s): LIPASE, AMYLASE in the last 168 hours. No results for input(s): AMMONIA in the last 168 hours. Cardiac Enzymes: No results for input(s): CKTOTAL, CKMB, CKMBINDEX, TROPONINI in the last 168 hours. BNP (last 3 results) No results for input(s): BNP in the last  8760 hours.  ProBNP (last 3 results) No results for input(s): PROBNP in the last 8760 hours.  CBG: No results for input(s): GLUCAP in the last 168 hours. Recent Results (from the past 240 hour(s))  Resp Panel by RT-PCR (Flu A&B, Covid) Nasopharyngeal Swab     Status: Abnormal   Collection Time: 09/12/20  3:54 PM   Specimen: Nasopharyngeal Swab; Nasopharyngeal(NP) swabs in vial transport medium  Result Value Ref Range Status   SARS Coronavirus 2 by RT PCR POSITIVE (A) NEGATIVE Final    Comment: RESULT CALLED TO, READ BACK BY AND VERIFIED WITH: BROOKS,B. R @1944  ON 02.08.2022 BY COHEN,K (NOTE) SARS-CoV-2 target nucleic acids are DETECTED.  The SARS-CoV-2 RNA is generally detectable in upper respiratory specimens during the acute phase of infection. Positive results are indicative of the presence of the identified virus, but do not rule out bacterial infection or co-infection with other pathogens not detected by the test. Clinical correlation with patient history and other diagnostic information is necessary to determine patient infection status. The expected result is Negative.  Fact Sheet for Patients: EntrepreneurPulse.com.au  Fact Sheet for Healthcare Providers: IncredibleEmployment.be  This test is not yet approved or cleared by the [place] FDA and  has been authorized for detection and/or diagnosis of SARS-CoV-2 by FDA under an Emergency Use Authorization (EUA).  This EUA will remain in effect (meaning this te st can be used) for the duration of  the COVID-19 declaration under Section 564(b)(1) of the Act, 21 U.S.C. section 360bbb-3(b)(1), unless the authorization is terminated or revoked sooner.     Influenza A by PCR NEGATIVE NEGATIVE Final   Influenza B by PCR NEGATIVE NEGATIVE Final    Comment: (NOTE) The Xpert Xpress SARS-CoV-2/FLU/RSV plus assay is intended as an aid in the diagnosis of influenza from Nasopharyngeal swab  specimens and should not be used as a sole basis for treatment. Nasal washings and aspirates are unacceptable for Xpert Xpress SARS-CoV-2/FLU/RSV testing.  Fact Sheet for Patients: EntrepreneurPulse.com.au  Fact Sheet for Healthcare Providers: IncredibleEmployment.be  This  test is not yet approved or cleared by the [place] and has been authorized for detection and/or diagnosis of SARS-CoV-2 by FDA under an Emergency Use Authorization (EUA). This EUA will remain in effect (meaning this test can be used) for the duration of the COVID-19 declaration under Section 564(b)(1) of the Act, 21 U.S.C. section 360bbb-3(b)(1), unless the authorization is terminated or revoked.  Performed at [facility], [place] [address] [place], [place] [address]   Blood culture (routine x 2)     Status: None (Preliminary result)   Collection Time: 09/12/20  4:40 PM   Specimen: BLOOD  Result Value Ref Range Status   Specimen Description   Final    BLOOD PORTA CATH Performed at [place] [address], [place], [place] [address]    Special Requests   Final    BOTTLES DRAWN AEROBIC AND ANAEROBIC Blood Culture adequate volume Performed at [facility] [address] [place], [place] [address]    Culture   Final    NO GROWTH 2 DAYS Performed at [place] [address], [place], [place] [address]    Report Status PENDING  Incomplete  Blood culture (routine x 2)     Status: None (Preliminary result)   Collection Time: 09/12/20  4:40 PM   Specimen: BLOOD  Result Value Ref Range Status   Specimen Description   Final    BLOOD RIGHT WRIST Performed at [place] [address], [place], [place] [address]    Special Requests   Final    BOTTLES DRAWN AEROBIC AND ANAEROBIC Blood Culture adequate volume Performed at [place] [address]  [place], [place] [address]    Culture   Final    NO GROWTH 2 DAYS Performed at [facility] [address], [place], [place] [address]    Report Status PENDING  Incomplete  Culture, sputum-assessment     Status: None   Collection Time: 09/13/20  4:29 AM   Specimen: Expectorated Sputum  Result Value Ref Range Status   Specimen Description EXPECTORATED SPUTUM  Final   Special Requests NONE  Final   Sputum evaluation   Final    THIS SPECIMEN IS ACCEPTABLE FOR SPUTUM CULTURE Performed at [facility], [place] [address], [place], [place] [address]    Report Status 09/13/2020 FINAL  Final  Culture, respiratory     Status: None (Preliminary result)   Collection Time: 09/13/20  4:29 AM  Result Value Ref Range Status   Specimen Description   Final    EXPECTORATED SPUTUM Performed at [facility] [address] [place], [place] [address]    Special Requests   Final    NONE Reflexed from [id number] Performed at [facility] [address], [place], [place] [address]    Gram Stain   Final    RARE WBC PRESENT, PREDOMINANTLY PMN RARE GRAM POSITIVE COCCI    Culture   Final    CULTURE REINCUBATED FOR BETTER GROWTH Performed at [facility], [place] [address], [place],  [address]    Report Status PENDING  Incomplete  MRSA PCR Screening     Status: None   Collection Time: 09/13/20  2:00 PM   Specimen: Nasopharyngeal  Result Value Ref Range Status   MRSA by PCR NEGATIVE NEGATIVE Final    Comment:        The GeneXpert MRSA  Assay (FDA approved for NASAL specimens only), is one component of a comprehensive MRSA colonization surveillance program. It is not intended to diagnose MRSA infection nor to guide or monitor treatment for MRSA infections. Performed at [facility], [place] [address], [place], [place] [address]      Studies: DG Chest Port 1 View  Result Date: 09/12/2020 CLINICAL DATA:  Patient  with supraglottic cancer with failure to thrive. EXAM: PORTABLE CHEST 1 VIEW COMPARISON:  Chest CT January 20, 2020. FINDINGS: Accessed right chest porta catheter with tip overlying the superior cavoatrial junction. The heart size and mediastinal contours are within normal limits. Aortic atherosclerosis. Emphysematous lung changes. Opacity in the left lung base. The visualized skeletal structures are unremarkable. IMPRESSION: 1. Opacity in the left lung base which could represent atelectasis versus pneumonia. 2. Aortic Atherosclerosis (ICD10-I70.0) and Emphysema (ICD10-J43.9). Electronically Signed   By: Dahlia Bailiff MD   On: 09/12/2020 15:22      Flora Lipps, MD  [facility] 09/14/2020  If 7PM-7AM, please contact night-coverage

## 2020-09-15 ENCOUNTER — Ambulatory Visit: Payer: Medicare Other

## 2020-09-15 LAB — CBC WITH DIFFERENTIAL/PLATELET
Abs Immature Granulocytes: 0.16 10*3/uL — ABNORMAL HIGH (ref 0.00–0.07)
Basophils Absolute: 0 10*3/uL (ref 0.0–0.1)
Basophils Relative: 1 %
Eosinophils Absolute: 0 10*3/uL (ref 0.0–0.5)
Eosinophils Relative: 0 %
HCT: 25.1 % — ABNORMAL LOW (ref 36.0–46.0)
Hemoglobin: 9.3 g/dL — ABNORMAL LOW (ref 12.0–15.0)
Immature Granulocytes: 6 %
Lymphocytes Relative: 22 %
Lymphs Abs: 0.6 10*3/uL — ABNORMAL LOW (ref 0.7–4.0)
MCH: 35.5 pg — ABNORMAL HIGH (ref 26.0–34.0)
MCHC: 37.1 g/dL — ABNORMAL HIGH (ref 30.0–36.0)
MCV: 95.8 fL (ref 80.0–100.0)
Monocytes Absolute: 0.5 10*3/uL (ref 0.1–1.0)
Monocytes Relative: 20 %
Neutro Abs: 1.4 10*3/uL — ABNORMAL LOW (ref 1.7–7.7)
Neutrophils Relative %: 51 %
Platelets: 149 10*3/uL — ABNORMAL LOW (ref 150–400)
RBC: 2.62 MIL/uL — ABNORMAL LOW (ref 3.87–5.11)
RDW: 17.7 % — ABNORMAL HIGH (ref 11.5–15.5)
WBC: 2.6 10*3/uL — ABNORMAL LOW (ref 4.0–10.5)
nRBC: 0.8 % — ABNORMAL HIGH (ref 0.0–0.2)

## 2020-09-15 LAB — COMPREHENSIVE METABOLIC PANEL
ALT: 16 U/L (ref 0–44)
AST: 26 U/L (ref 15–41)
Albumin: 2.6 g/dL — ABNORMAL LOW (ref 3.5–5.0)
Alkaline Phosphatase: 45 U/L (ref 38–126)
Anion gap: 7 (ref 5–15)
BUN: 11 mg/dL (ref 8–23)
CO2: 27 mmol/L (ref 22–32)
Calcium: 8.5 mg/dL — ABNORMAL LOW (ref 8.9–10.3)
Chloride: 103 mmol/L (ref 98–111)
Creatinine, Ser: 0.52 mg/dL (ref 0.44–1.00)
GFR, Estimated: >60 mL/min (ref >60)
Glucose, Bld: 112 mg/dL — ABNORMAL HIGH (ref 70–99)
Potassium: 4.5 mmol/L (ref 3.5–5.1)
Sodium: 137 mmol/L (ref 135–145)
Total Bilirubin: 0.7 mg/dL (ref 0.3–1.2)
Total Protein: 5.9 g/dL — ABNORMAL LOW (ref 6.5–8.1)

## 2020-09-15 LAB — MAGNESIUM: Magnesium: 1.6 mg/dL — ABNORMAL LOW (ref 1.7–2.4)

## 2020-09-15 LAB — C-REACTIVE PROTEIN: CRP: 1.6 mg/dL — ABNORMAL HIGH (ref <1.0)

## 2020-09-15 LAB — PHOSPHORUS: Phosphorus: 3.3 mg/dL (ref 2.5–4.6)

## 2020-09-15 LAB — CULTURE, RESPIRATORY W GRAM STAIN: Culture: NORMAL

## 2020-09-15 LAB — D-DIMER, QUANTITATIVE: D-Dimer, Quant: 10.23 ug/mL-FEU — ABNORMAL HIGH (ref 0.00–0.50)

## 2020-09-15 MED ORDER — ALBUTEROL SULFATE HFA 108 (90 BASE) MCG/ACT IN AERS
2.0000 | INHALATION_SPRAY | Freq: Two times a day (BID) | RESPIRATORY_TRACT | Status: DC
  Administered 2020-09-15 – 2020-09-16 (×3): 2 via RESPIRATORY_TRACT
  Filled 2020-09-15: qty 6.7

## 2020-09-15 MED ORDER — MAGNESIUM OXIDE 400 (241.3 MG) MG PO TABS
400.0000 mg | ORAL_TABLET | Freq: Two times a day (BID) | ORAL | Status: DC
  Administered 2020-09-15 – 2020-09-16 (×3): 400 mg via ORAL
  Filled 2020-09-15 (×3): qty 1

## 2020-09-15 MED ORDER — LOPERAMIDE HCL 2 MG PO CAPS
2.0000 mg | ORAL_CAPSULE | ORAL | Status: DC | PRN
  Administered 2020-09-15 – 2020-09-16 (×3): 2 mg via ORAL
  Filled 2020-09-15 (×3): qty 1

## 2020-09-15 NOTE — Progress Notes (Signed)
Pharmacy Antibiotic Note  Raven Torres is a 65 y.o. female admitted on 09/12/2020 with pneumonia.  Pharmacy has been consulted for vancomycin & cefepime dosing.  MRSA negative, so vancomycin has been stopped   Plan: Continue cefepime 2 gm IV q8h F/u renal function, WBC, temp, culture data  Height: 5\' 6"  (167.6 cm) Weight: 57.5 kg (126 lb 12.2 oz) IBW/kg (Calculated) : 59.3  Temp (24hrs), Avg:98.2 F (36.8 C), Min:97.7 F (36.5 C), Max:99 F (37.2 C)  Recent Labs  Lab 09/12/20 1452 09/12/20 1615 09/13/20 0034 09/13/20 0308 09/14/20 0502 09/15/20 0415  WBC 1.5*  --  1.0* 0.9* 2.0* 2.6*  CREATININE 0.49  --  0.55 0.60 0.52 0.52  LATICACIDVEN  --  0.8 0.6  --   --   --     Estimated Creatinine Clearance: 61.9 mL/min (by C-G formula based on SCr of 0.52 mg/dL).    Allergies  Allergen Reactions  . Tudorza Pressair [Aclidinium Bromide] Itching  . Amitriptyline Other (See Comments)    Eye spasms per pt  . Codeine Nausea Only  . Tramadol Nausea Only   Antimicrobials this admission:  2/8 Vanc >>2/10 2/8 cefepime >> 2/8 Remdesivir>>2/12  Microbiology results:  2/8 BCx2: NGTD  2/8 Covid:+, flu neg 2/8 MRSA: neg 2/9 sputum: rare GPC - normal flora   Thank you for allowing pharmacy to be a part of this patient's care.  Royetta Asal, PharmD, BCPS 09/15/2020 11:30 AM

## 2020-09-15 NOTE — Progress Notes (Signed)
RN informed SLP that pt wants to eat soft food diet.  Given pt has managed her dysphagia thus far without incident, recommend soft/thin diet.  Follow up with OP SLP Glendell Docker.    [name], MS [facility] SLP Acute Rehab Services Office [phone] Pager [phone]

## 2020-09-15 NOTE — Progress Notes (Signed)
PROGRESS NOTE  Raven Torres HAL:[id number] DOB: 1956-03-19 DOA: 09/12/2020 PCP: Ladell Pier, MD   LOS: 3 days   Brief narrative:  Raven Torres is a 65 y.o. 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 Chryl Heck, w/ PEG in place, presented to hospital with dizziness palpitation especially on standing with generalized weakness.  Patient was noted to be orthostatic in the radiation treatment area on the day of presentation and was sent to the hospital.  In the ED patient was noted to have hypokalemia, hypomagnesemia leukopenia, thrombocytopenia , CXR-opacity in the left lung atelectasis versus pneumonia. Patient was given 1 L bolus additionally, placed on vancomycin and cefepime and  was admitted to hospital for further evaluation and treatment.  Assessment/Plan:  Principal Problem:   Hypotension Active Problems:   Chronic hepatitis C virus infection (HCC)   Rheumatoid arthritis (HCC)   HTN (hypertension)   Malignant neoplasm of supraglottis (HCC)   Protein-calorie malnutrition, severe  Orthostatic hypotension on presentation.  Received IV fluid bolus and IV fluids.  Will check orthostatic vitals.  Spoke with nursing about it, off IV fluids.  Failure to thrive, severe protein calorie malnutrition:  Present on admission.  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  Currently on tube feeding.  Speech therapy has seen the patient and recommend dysphagia 3 diet.  Dietary services on board.  Continue Ensure Magic cup.  Patient is refusing Osmolite due to diarrhea  Hypokalemia  improved after replacement.  Continue to monitor  Hypomagnesemia.  Continue to replace orally.  Check levels in a.m.  Left lower base pneumonia : Urinary  strep  antigen positive. Covid positive.  Procalcitonin less than 0.1x2 but  urinary antigen is positive for strep.  Off vancomycin.  Continue cefepime for now.  Improving leukopenia and neutropenia.  Blood cultures negative in 3 days.  Continue remdesivir-aiming for a 5-day course., steroids.  Continue to monitor inflammatory markers.  CRP trending down  COVID-19 Labs  Recent Labs    09/13/20 0034 09/13/20 0308 09/14/20 0502 09/15/20 0415  DDIMER 9.36* 9.76* 11.08* 10.23*  FERRITIN 983*  --   --   --   CRP 4.3* 4.4* 2.8* 1.6*    Lab Results  Component Value Date   SARSCOV2NAA POSITIVE (A) 09/12/2020   SARSCOV2NAA NEGATIVE 07/24/2020   [place] NEGATIVE 06/26/2020   [place] Not Detected 04/28/2020    Acute hypoxic respiratory failure secondary to pneumonia saturation of 87% on room air on presentation.  Currently on room air.  We will continue to monitor closely.  Essential hypertension- antihypertensives on hold.  Latest blood pressure of 124/85  Rheumatoid arthritis.  Continue supportive care as needed.  No acute flare at this time.  Hyperlipidemia continue statin  Chronic Hep c w/ liver cirrhosis: Compensated.  LFTs within normal range except for low albumin.  COPD/chronic emphysema  patient is not on home oxygen.  Patient with history of latent TB treated 3 years ago.  Not currently in exacerbation.  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 Chryl Heck, and Rad Onc.    On dysphagia 3 diet.  Pancytopenia with anemia, thrombocytopenia and leukopenia.  Improving counts, we will continue to monitor.  Patient is on cefepime.  DVT prophylaxis: enoxaparin (LOVENOX) injection 40 mg Start: 09/13/20 1000   Code Status: DNR  Family Communication: Spoke  with the patient's daughter on the phone and updated her about the clinical condition of the patient.   Status is: Inpatient  Remains inpatient appropriate because:IV treatments appropriate due to intensity of illness or inability to take  PO and Inpatient level of care appropriate due to severity of illness, pneumonia on IV antibiotics, immunocompromised host  Dispo: The patient is from: Home              Anticipated d/c is to: Home              Anticipated d/c date is: 1-2 days              Patient currently is not medically stable to d/c.   Difficult to place patient No  Consultants:  None   Procedures:  None  Anti-infectives:  . Remdesivir 2/8> . Vancomycin, 2/8> 2/10 . Cefepime 2/8>  Anti-infectives (From admission, onward)   Start     Dose/Rate Route Frequency Ordered Stop   09/13/20 1000  remdesivir 100 mg in sodium chloride 0.9 % 100 mL IVPB       "Followed by" Linked Group Details   100 mg 200 mL/hr over 30 Minutes Intravenous Daily 09/12/20 2156 09/17/20 0959   09/13/20 0600  vancomycin (VANCOREADY) IVPB 500 mg/100 mL  Status:  Discontinued        500 mg 100 mL/hr over 60 Minutes Intravenous Every 12 hours 09/12/20 1830 09/14/20 1305   09/13/20 0000  ceFEPIme (MAXIPIME) 2 g in sodium chloride 0.9 % 100 mL IVPB        2 g 200 mL/hr over 30 Minutes Intravenous Every 8 hours 09/12/20 1830     09/12/20 2300  remdesivir 200 mg in sodium chloride 0.9% 250 mL IVPB       "Followed by" Linked Group Details   200 mg 580 mL/hr over 30 Minutes Intravenous Once 09/12/20 2156 09/12/20 2337   09/12/20 1630  vancomycin (VANCOREADY) IVPB 1250 mg/250 mL        1,250 mg 166.7 mL/hr over 90 Minutes Intravenous  Once 09/12/20 1616 09/12/20 2010   09/12/20 1615  ceFEPIme (MAXIPIME) 2 g in sodium chloride 0.9 % 100 mL IVPB        2 g 200 mL/hr over 30 Minutes Intravenous  Once 09/12/20 1610 09/12/20 1735     Subjective: Today, patient was seen and examined at bedside.  Complains of cough with some clear sputum production.  Wants to eat real solid food.  Complains of loose bowel movements with Osmolite.  Denies overt pain, nausea, vomiting  Objective: Vitals:   09/14/20 2008 09/15/20 0406  BP: 117/70 124/85   Pulse: 90 89  Resp: (!) 21 18  Temp: 97.9 F (36.6 C) 97.7 F (36.5 C)  SpO2: 94% 94%    Intake/Output Summary (Last 24 hours) at 09/15/2020 1206 Last data filed at 09/15/2020 0300 Gross per 24 hour  Intake 320 ml  Output --  Net 320 ml   Filed Weights   09/12/20 2219  Weight: 57.5 kg   Body mass index is 20.46 kg/m.   Physical Exam: General: Thinly built, not in obvious distress chronically ill, mildly anxious HENT:   No scleral pallor or icterus noted. Oral mucosa is moist.  Chest:   Diminished breath sounds bilaterally.   Right chest wall port in place CVS: S1 &S2 heard. No murmur.  Regular rate and rhythm. Abdomen: Soft, nontender, nondistended.  Bowel sounds are heard.  PEG tube in place without induration  or erythema. Extremities: No cyanosis, clubbing or edema.  Peripheral pulses are palpable. Psych: Alert, awake and oriented, normal mood CNS:  No cranial nerve deficits.  Power equal in all extremities.  Moves all extremities. Skin: Warm and dry.  No rashes noted.  Data Review: I have personally reviewed the following laboratory data and studies,  CBC: Recent Labs  Lab 09/12/20 1452 09/13/20 0034 09/13/20 0308 09/14/20 0502 09/15/20 0415  WBC 1.5* 1.0* 0.9* 2.0* 2.6*  NEUTROABS 0.6*  --  0.5* 1.1* 1.4*  HGB 9.6* 8.1* 8.5* 8.9* 9.3*  HCT 27.1* 23.8* 24.5* 25.5* 25.1*  MCV 92.2 93.0 92.1 94.4 95.8  PLT 99* 82* 87* 113* 937*   Basic Metabolic Panel: Recent Labs  Lab 09/12/20 1452 09/13/20 0034 09/13/20 0308 09/14/20 0502 09/15/20 0415  NA 139  --  136 138 137  K 3.4*  --  3.8 4.4 4.5  CL 103  --  105 104 103  CO2 24  --  24 26 27   GLUCOSE 90  --  165* 137* 112*  BUN 19  --  13 10 11   CREATININE 0.49 0.55 0.60 0.52 0.52  CALCIUM 8.3*  --  7.4* 8.5* 8.5*  MG 1.4*  --  2.0  --  1.6*  PHOS  --   --   --   --  3.3   Liver Function Tests: Recent Labs  Lab 09/12/20 1452 09/13/20 0308 09/14/20 0502 09/15/20 0415  AST 22 20 24 26   ALT 13 13 13  16   ALKPHOS 45 40 46 45  BILITOT 1.0 0.7 0.5 0.7  PROT 6.0* 5.3* 5.5* 5.9*  ALBUMIN 2.5* 2.3* 2.3* 2.6*   No results for input(s): LIPASE, AMYLASE in the last 168 hours. No results for input(s): AMMONIA in the last 168 hours. Cardiac Enzymes: No results for input(s): CKTOTAL, CKMB, CKMBINDEX, TROPONINI in the last 168 hours. BNP (last 3 results) No results for input(s): BNP in the last 8760 hours.  ProBNP (last 3 results) No results for input(s): PROBNP in the last 8760 hours.  CBG: No results for input(s): GLUCAP in the last 168 hours. Recent Results (from the past 240 hour(s))  Resp Panel by RT-PCR (Flu A&B, Covid) Nasopharyngeal Swab     Status: Abnormal   Collection Time: 09/12/20  3:54 PM   Specimen: Nasopharyngeal Swab; Nasopharyngeal(NP) swabs in vial transport medium  Result Value Ref Range Status   SARS Coronavirus 2 by RT PCR POSITIVE (A) NEGATIVE Final    Comment: RESULT CALLED TO, READ BACK BY AND VERIFIED WITH: BROOKS,B. R @1944  ON 02.08.2022 BY COHEN,K (NOTE) SARS-CoV-2 target nucleic acids are DETECTED.  The SARS-CoV-2 RNA is generally detectable in upper respiratory specimens during the acute phase of infection. Positive results are indicative of the presence of the identified virus, but do not rule out bacterial infection or co-infection with other pathogens not detected by the test. Clinical correlation with patient history and other diagnostic information is necessary to determine patient infection status. The expected result is Negative.  Fact Sheet for Patients: EntrepreneurPulse.com.au  Fact Sheet for Healthcare Providers: IncredibleEmployment.be  This test is not yet approved or cleared by the [place] FDA and  has been authorized for detection and/or diagnosis of SARS-CoV-2 by FDA under an Emergency Use Authorization (EUA).  This EUA will remain in effect (meaning this te st can be used) for the duration of   the COVID-19 declaration under Section 564(b)(1) of the Act, 21 U.S.C. section 360bbb-3(b)(1), unless the authorization  is terminated or revoked sooner.     Influenza A by PCR NEGATIVE NEGATIVE Final   Influenza B by PCR NEGATIVE NEGATIVE Final    Comment: (NOTE) The Xpert Xpress SARS-CoV-2/FLU/RSV plus assay is intended as an aid in the diagnosis of influenza from Nasopharyngeal swab specimens and should not be used as a sole basis for treatment. Nasal washings and aspirates are unacceptable for Xpert Xpress SARS-CoV-2/FLU/RSV testing.  Fact Sheet for Patients: EntrepreneurPulse.com.au  Fact Sheet for Healthcare Providers: IncredibleEmployment.be  This test is not yet approved or cleared by the [place] FDA and has been authorized for detection and/or diagnosis of SARS-CoV-2 by FDA under an Emergency Use Authorization (EUA). This EUA will remain in effect (meaning this test can be used) for the duration of the COVID-19 declaration under Section 564(b)(1) of the Act, 21 U.S.C. section 360bbb-3(b)(1), unless the authorization is terminated or revoked.  Performed at [facility], [place] [address], [place], [place] [address]   Blood culture (routine x 2)     Status: None (Preliminary result)   Collection Time: 09/12/20  4:40 PM   Specimen: BLOOD  Result Value Ref Range Status   Specimen Description   Final    BLOOD PORTA CATH Performed at [place] [address] [place], [place] [address]    Special Requests   Final    BOTTLES DRAWN AEROBIC AND ANAEROBIC Blood Culture adequate volume Performed at [place] [address] [place], [place] [address]    Culture   Final    NO GROWTH 3 DAYS Performed at [facility], [place] [address] [place], [place] [address]    Report Status PENDING  Incomplete  Blood culture (routine x 2)     Status: None (Preliminary result)    Collection Time: 09/12/20  4:40 PM   Specimen: BLOOD  Result Value Ref Range Status   Specimen Description   Final    BLOOD RIGHT WRIST Performed at [place] [address] [place], [place] [address]    Special Requests   Final    BOTTLES DRAWN AEROBIC AND ANAEROBIC Blood Culture adequate volume Performed at [place] [address], [place], [place] [address]    Culture   Final    NO GROWTH 3 DAYS Performed at [facility], [place] [address], [place], [place] [address]    Report Status PENDING  Incomplete  Culture, sputum-assessment     Status: None   Collection Time: 09/13/20  4:29 AM   Specimen: Expectorated Sputum  Result Value Ref Range Status   Specimen Description EXPECTORATED SPUTUM  Final   Special Requests NONE  Final   Sputum evaluation   Final    THIS SPECIMEN IS ACCEPTABLE FOR SPUTUM CULTURE Performed at [facility], [place] [address] [place], [place] [address]    Report Status 09/13/2020 FINAL  Final  Culture, respiratory     Status: None   Collection Time: 09/13/20  4:29 AM  Result Value Ref Range Status   Specimen Description   Final    EXPECTORATED SPUTUM Performed at [place] [address], [place], [place] [address]    Special Requests   Final    NONE Reflexed from [id number] Performed at [place] [address] [place], [place] [address]    Gram Stain   Final    RARE WBC PRESENT, PREDOMINANTLY PMN RARE GRAM POSITIVE COCCI  Culture   Final    Normal respiratory flora-no Staph aureus or Pseudomonas seen Performed at [place] [address], [place], [place] [address]    Report Status 09/15/2020 FINAL  Final  MRSA PCR Screening     Status: None   Collection Time: 09/13/20  2:00 PM   Specimen: Nasopharyngeal  Result Value Ref Range Status   MRSA by PCR NEGATIVE NEGATIVE Final    Comment:        The GeneXpert MRSA Assay  (FDA approved for NASAL specimens only), is one component of a comprehensive MRSA colonization surveillance program. It is not intended to diagnose MRSA infection nor to guide or monitor treatment for MRSA infections. Performed at [facility], [place] [address], [place], [place] [address]      Studies: No results found.    Flora Lipps, MD  [facility] 09/15/2020  If 7PM-7AM, please contact night-coverage

## 2020-09-15 NOTE — Care Management Important Message (Signed)
Medicare IM printed for Raven Torres to give to the patient. 

## 2020-09-16 LAB — CBC WITH DIFFERENTIAL/PLATELET
Abs Immature Granulocytes: 0.16 10*3/uL — ABNORMAL HIGH (ref 0.00–0.07)
Basophils Absolute: 0 10*3/uL (ref 0.0–0.1)
Basophils Relative: 0 %
Eosinophils Absolute: 0 10*3/uL (ref 0.0–0.5)
Eosinophils Relative: 0 %
HCT: 25.1 % — ABNORMAL LOW (ref 36.0–46.0)
Hemoglobin: 8.3 g/dL — ABNORMAL LOW (ref 12.0–15.0)
Immature Granulocytes: 7 %
Lymphocytes Relative: 19 %
Lymphs Abs: 0.4 10*3/uL — ABNORMAL LOW (ref 0.7–4.0)
MCH: 31.4 pg (ref 26.0–34.0)
MCHC: 33.1 g/dL (ref 30.0–36.0)
MCV: 95.1 fL (ref 80.0–100.0)
Monocytes Absolute: 0.6 10*3/uL (ref 0.1–1.0)
Monocytes Relative: 28 %
Neutro Abs: 1.1 10*3/uL — ABNORMAL LOW (ref 1.7–7.7)
Neutrophils Relative %: 46 %
Platelets: 134 10*3/uL — ABNORMAL LOW (ref 150–400)
RBC: 2.64 MIL/uL — ABNORMAL LOW (ref 3.87–5.11)
RDW: 19.4 % — ABNORMAL HIGH (ref 11.5–15.5)
WBC: 2.3 10*3/uL — ABNORMAL LOW (ref 4.0–10.5)
nRBC: 1.8 % — ABNORMAL HIGH (ref 0.0–0.2)

## 2020-09-16 LAB — COMPREHENSIVE METABOLIC PANEL
ALT: 15 U/L (ref 0–44)
AST: 23 U/L (ref 15–41)
Albumin: 2.2 g/dL — ABNORMAL LOW (ref 3.5–5.0)
Alkaline Phosphatase: 39 U/L (ref 38–126)
Anion gap: 7 (ref 5–15)
BUN: 12 mg/dL (ref 8–23)
CO2: 29 mmol/L (ref 22–32)
Calcium: 8.4 mg/dL — ABNORMAL LOW (ref 8.9–10.3)
Chloride: 104 mmol/L (ref 98–111)
Creatinine, Ser: 0.55 mg/dL (ref 0.44–1.00)
GFR, Estimated: >60 mL/min (ref >60)
Glucose, Bld: 82 mg/dL (ref 70–99)
Potassium: 3.7 mmol/L (ref 3.5–5.1)
Sodium: 140 mmol/L (ref 135–145)
Total Bilirubin: 0.6 mg/dL (ref 0.3–1.2)
Total Protein: 5.1 g/dL — ABNORMAL LOW (ref 6.5–8.1)

## 2020-09-16 LAB — D-DIMER, QUANTITATIVE: D-Dimer, Quant: 9.5 ug/mL-FEU — ABNORMAL HIGH (ref 0.00–0.50)

## 2020-09-16 LAB — C-REACTIVE PROTEIN: CRP: 1 mg/dL — ABNORMAL HIGH (ref <1.0)

## 2020-09-16 MED ORDER — PANTOPRAZOLE SODIUM 40 MG PO TBEC: 40.0000 mg | DELAYED_RELEASE_TABLET | Freq: Every day | ORAL | 0 refills | Status: DC

## 2020-09-16 MED ORDER — LOPERAMIDE HCL 2 MG PO CAPS: 2.0000 mg | ORAL_CAPSULE | ORAL | 0 refills | Status: DC | PRN

## 2020-09-16 MED ORDER — HEPARIN SOD (PORK) LOCK FLUSH 100 UNIT/ML IV SOLN
500.0000 [IU] | INTRAVENOUS | Status: AC | PRN
  Administered 2020-09-16: 500 [IU]

## 2020-09-16 MED ORDER — CEFDINIR 300 MG PO CAPS: 300.0000 mg | ORAL_CAPSULE | Freq: Two times a day (BID) | ORAL | 0 refills | Status: AC

## 2020-09-16 MED ORDER — PREDNISONE 50 MG PO TABS: 50.0000 mg | ORAL_TABLET | Freq: Every day | ORAL | 0 refills | Status: AC

## 2020-09-16 MED ORDER — CHLORHEXIDINE GLUCONATE 0.12 % MT SOLN: 15.0000 mL | Freq: Two times a day (BID) | OROMUCOSAL | 0 refills | Status: DC

## 2020-09-16 MED ORDER — GUAIFENESIN-DM 100-10 MG/5ML PO SYRP: 10.0000 mL | ORAL_SOLUTION | ORAL | 0 refills | Status: DC | PRN

## 2020-09-16 NOTE — Progress Notes (Incomplete)
Nutrition Follow-up  DOCUMENTATION CODES:   Severe malnutrition in context of chronic illness  INTERVENTION:  ***   NUTRITION DIAGNOSIS:   Severe Malnutrition related to chronic illness (cancer and related treatments) as evidenced by moderate fat depletion,severe fat depletion,moderate muscle depletion,severe muscle depletion,percent weight loss.  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 Pt admitted for hypotension and pneumonia secondary to COVID-19 infection.  Pt unavailable at time of RD visit; however, pt was previously evaluated by [facility] RD on 2/9 and received full assessment. Per RD note, pt has TF goal of 6 cartons Osmolite 1.5 per day, but has reported difficulty reaching this goal due to lack of time and desire to leave room in her stomach to enjoy foods po. Pt was encouraged to continue working towards goal, but at the time was only consistently completing 1 bolus and supplementing with oral nutrition supplements and soups po.   tube feed recommendations, diarrhea on osmolite?  Diet Order:   Diet Order            DIET DYS 3 Room service appropriate? Yes; Fluid consistency: Thin  Diet effective now                 EDUCATION NEEDS:   Education needs have been addressed  Skin:  Skin Assessment: Reviewed RN Assessment  Last BM:  2/9  Height:   Ht Readings from Last  1 Encounters:  09/12/20 '5\' 6"'  (1.676 m)    Weight:   Wt Readings from Last 1 Encounters:  09/12/20 57.5 kg   BMI:  Body mass index is 20.46 kg/m.  Estimated Nutritional Needs:   Kcal:  1800-2000  Protein:  90-105  Fluid:  >1.8 L    Larkin Ina, MS, RD, LDN RD pager number and weekend/on-call pager number located in [place].

## 2020-09-16 NOTE — Discharge Summary (Signed)
Physician Discharge Summary  TING CAGE HEN:[id number] DOB: Sep 20, 1955 DOA: 09/12/2020  PCP: Ladell Pier, MD  Admit date: 09/12/2020 Discharge date: 09/16/2020  Admitted From: Home  Discharge disposition: Home  Recommendations for Outpatient Follow-Up:   Follow up with your primary care provider in one week.  Check CBC, CMP, magnesium, phosphorus in the next visit Patient will need to follow-up with her oncologist as outpatient. Discharge Diagnosis:   Principal Problem:   Orthostatic Hypotension Active Problems:   Chronic hepatitis C virus infection (HCC)   Rheumatoid arthritis (HCC)   HTN (hypertension)   Malignant neoplasm of supraglottis (HCC)   Protein-calorie malnutrition, severe   Discharge Condition: Improved.  Diet recommendation: Low sodium, heart healthy.    Wound care: None.  Code status: DNR   History of Present Illness:   Raven Torres is a 65 y.o. 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 Chryl Heck, w/ PEG in place, presented to hospital with dizziness palpitation especially on standing with generalized weakness.  Patient was noted to be orthostatic in the radiation treatment area on the day of presentation and was sent to the hospital.  In the ED patient was noted to have hypokalemia, hypomagnesemia leukopenia, thrombocytopenia , CXR-opacity in the left lung atelectasis versus pneumonia. Patient was given 1 L bolus additionally, placed on vancomycin and cefepime and  was admitted to hospital for further evaluation and treatment.  Hospital Course:   Following conditions were addressed during hospitalization as listed below,  Orthostatic hypotension on presentation.  Received IV fluid bolus and IV fluids.    Improved   Failure to thrive, severe protein calorie malnutrition:   Present on admission.  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  Currently on tube feeding.  Speech therapy has seen the patient and recommend dysphagia 3 diet.    Hypokalemia  improved after replacement.  Continue to monitor as outpatient.   Hypomagnesemia.    Replenished.  Monitor as outpatient.   Left lower base pneumonia : Urinary  strep  antigen positive. Covid positive.  Procalcitonin less than 0.1x2 but urinary antigen is positive for strep.  Patient was initially on vancomycin and cefepime which was changed to cefepime.  Will be discharged home on Omnicef to complete the course. Improving leukopenia and neutropenia.  Blood cultures negative in 3 days.    Received 3-day course of remdesivir.  Inflammatory markers trended down.  Acute hypoxic respiratory failure secondary to pneumonia saturation of 87% on room air on presentation.    Patient was subsequently weaned off the oxygen.   Essential hypertension-antihypertensive medications have been resumed on discharge.   Rheumatoid arthritis.  Continue supportive care as needed.  No acute flare at this time.   Hyperlipidemia continue statin   Chronic Hep c w/ liver cirrhosis: Compensated.  LFTs within normal range except for low albumin.   COPD/chronic emphysema  patient is not on home oxygen.  Patient with history of latent TB treated 3 years ago.    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 Chryl Heck, and Rad Onc.    On dysphagia 3 diet.  Will follow up with oncology as outpatient   Pancytopenia with anemia, thrombocytopenia and leukopenia.  Improving counts.  Follow-up as outpatient.  Disposition.  At this time, patient is stable for disposition with outpatient PCP and  oncology follow-up.  Medical Consultants:   None.  Procedures:    None Subjective:   Today, patient was seen and examined at bedside.  Patient strongly wishes to  go.  Denies any shortness of breath,  Discharge Exam:   Vitals:   09/15/20 2117 09/16/20 0434  BP: (!) 98/54 (!) 95/55  Pulse: 96 79  Resp:  16  Temp:  98.9 F (37.2 C)  SpO2:  95%   Vitals:   09/15/20 2114 09/15/20 2115 09/15/20 2117 09/16/20 0434  BP: (!) 119/54 (!) 114/48 (!) 98/54 (!) 95/55  Pulse: 82 90 96 79  Resp: 16   16  Temp: 98.7 F (37.1 C)   98.9 F (37.2 C)  TempSrc: Oral   Oral  SpO2: 95%   95%  Weight:      Height:        General: Alert awake, not in obvious distress HENT: pupils equally reacting to light,  No scleral pallor or icterus noted. Oral mucosa is moist.  Chest:  Clear breath sounds.  Diminished breath sounds bilaterally. No crackles or wheezes.  CVS: S1 &S2 heard. No murmur.  Regular rate and rhythm. Abdomen: Soft, nontender, nondistended.  Bowel sounds are heard.   Extremities: No cyanosis, clubbing or edema.  Peripheral pulses are palpable. Psych: Alert, awake and oriented, normal mood CNS:  No cranial nerve deficits.  Power equal in all extremities.   Skin: Warm and dry.  No rashes noted.  The results of significant diagnostics from this hospitalization (including imaging, microbiology, ancillary and laboratory) are listed below for reference.     Diagnostic Studies:   DG Chest Port 1 View  Result Date: 09/12/2020 CLINICAL DATA:  Patient with supraglottic cancer with failure to thrive. EXAM: PORTABLE CHEST 1 VIEW COMPARISON:  Chest CT January 20, 2020. FINDINGS: Accessed right chest porta catheter with tip overlying the superior cavoatrial junction. The heart size and mediastinal contours are within normal limits. Aortic atherosclerosis. Emphysematous lung changes. Opacity in the left lung base. The visualized skeletal structures are unremarkable. IMPRESSION: 1. Opacity in the left lung base which could represent atelectasis versus pneumonia. 2. Aortic Atherosclerosis (ICD10-I70.0) and Emphysema (ICD10-J43.9). Electronically Signed   By: Dahlia Bailiff MD   On: 09/12/2020 15:22     Labs:   Basic Metabolic Panel: Recent Labs  Lab 09/12/20 1452 09/13/20 0034 09/13/20 0308 09/14/20 0502 09/15/20 0415 09/16/20 0310  NA 139  --  136 138 137 140  K 3.4*  --  3.8 4.4 4.5 3.7  CL 103  --  105 104 103 104  CO2 24  --  24 26 27 29   GLUCOSE 90  --  165* 137* 112* 82  BUN 19  --  13 10 11 12   CREATININE 0.49 0.55 0.60 0.52 0.52 0.55  CALCIUM 8.3*  --  7.4* 8.5* 8.5* 8.4*  MG 1.4*  --  2.0  --  1.6*  --   PHOS  --   --   --   --  3.3  --    GFR Estimated Creatinine Clearance: 61.9 mL/min (by C-G formula based on SCr of 0.55 mg/dL). Liver Function Tests: Recent Labs  Lab 09/12/20 1452 09/13/20 0308 09/14/20 0502 09/15/20 0415 09/16/20 0310  AST 22 20 24 26 23   ALT 13 13 13 16 15   ALKPHOS 45 40 46 45 39  BILITOT 1.0 0.7 0.5 0.7 0.6  PROT 6.0* 5.3* 5.5* 5.9* 5.1*  ALBUMIN 2.5* 2.3* 2.3* 2.6* 2.2*  No results for input(s): LIPASE, AMYLASE in the last 168 hours. No results for input(s): AMMONIA in the last 168 hours. Coagulation profile No results for input(s): INR, PROTIME in the last 168 hours.  CBC: Recent Labs  Lab 09/12/20 1452 09/13/20 0034 09/13/20 0308 09/14/20 0502 09/15/20 0415 09/16/20 0310  WBC 1.5* 1.0* 0.9* 2.0* 2.6* 2.3*  NEUTROABS 0.6*  --  0.5* 1.1* 1.4* 1.1*  HGB 9.6* 8.1* 8.5* 8.9* 9.3* 8.3*  HCT 27.1* 23.8* 24.5* 25.5* 25.1* 25.1*  MCV 92.2 93.0 92.1 94.4 95.8 95.1  PLT 99* 82* 87* 113* 149* 134*   Cardiac Enzymes: No results for input(s): CKTOTAL, CKMB, CKMBINDEX, TROPONINI in the last 168 hours. BNP: Invalid input(s): POCBNP CBG: No results for input(s): GLUCAP in the last 168 hours. D-Dimer Recent Labs    09/15/20 0415 09/16/20 0310  DDIMER 10.23* 9.50*   Hgb A1c No results for input(s): HGBA1C in the last 72 hours. Lipid Profile No results for input(s): CHOL, HDL, LDLCALC, TRIG, CHOLHDL, LDLDIRECT in the last 72 hours. Thyroid function studies No results for input(s):  TSH, T4TOTAL, T3FREE, THYROIDAB in the last 72 hours.  Invalid input(s): FREET3 Anemia work up No results for input(s): VITAMINB12, FOLATE, FERRITIN, TIBC, IRON, RETICCTPCT in the last 72 hours. Microbiology Recent Results (from the past 240 hour(s))  Resp Panel by RT-PCR (Flu A&B, Covid) Nasopharyngeal Swab     Status: Abnormal   Collection Time: 09/12/20  3:54 PM   Specimen: Nasopharyngeal Swab; Nasopharyngeal(NP) swabs in vial transport medium  Result Value Ref Range Status   SARS Coronavirus 2 by RT PCR POSITIVE (A) NEGATIVE Final    Comment: RESULT CALLED TO, READ BACK BY AND VERIFIED WITH: BROOKS,B. R @1944  ON 02.08.2022 BY COHEN,K (NOTE) SARS-CoV-2 target nucleic acids are DETECTED.  The SARS-CoV-2 RNA is generally detectable in upper respiratory specimens during the acute phase of infection. Positive results are indicative of the presence of the identified virus, but do not rule out bacterial infection or co-infection with other pathogens not detected by the test. Clinical correlation with patient history and other diagnostic information is necessary to determine patient infection status. The expected result is Negative.  Fact Sheet for Patients: EntrepreneurPulse.com.au  Fact Sheet for Healthcare Providers: IncredibleEmployment.be  This test is not yet approved or cleared by the [place] FDA and  has been authorized for detection and/or diagnosis of SARS-CoV-2 by FDA under an Emergency Use Authorization (EUA).  This EUA will remain in effect (meaning this te st can be used) for the duration of  the COVID-19 declaration under Section 564(b)(1) of the Act, 21 U.S.C. section 360bbb-3(b)(1), unless the authorization is terminated or revoked sooner.     Influenza A by PCR NEGATIVE NEGATIVE Final   Influenza B by PCR NEGATIVE NEGATIVE Final    Comment: (NOTE) The Xpert Xpress SARS-CoV-2/FLU/RSV plus assay is intended as an aid in  the diagnosis of influenza from Nasopharyngeal swab specimens and should not be used as a sole basis for treatment. Nasal washings and aspirates are unacceptable for Xpert Xpress SARS-CoV-2/FLU/RSV testing.  Fact Sheet for Patients: EntrepreneurPulse.com.au  Fact Sheet for Healthcare Providers: IncredibleEmployment.be  This test is not yet approved or cleared by the [place] FDA and has been authorized for detection and/or diagnosis of SARS-CoV-2 by FDA under an Emergency Use Authorization (EUA). This EUA will remain in effect (meaning this test can be used) for the duration of the COVID-19 declaration under Section 564(b)(1) of the Act, 21 U.S.C. section 360bbb-3(b)(1),  unless the authorization is terminated or revoked.  Performed at [facility], [place] [address] [place], [place] [address]   Blood culture (routine x 2)     Status: None (Preliminary result)   Collection Time: 09/12/20  4:40 PM   Specimen: BLOOD  Result Value Ref Range Status   Specimen Description   Final    BLOOD PORTA CATH Performed at [place] [address] [place], [place] [address]    Special Requests   Final    BOTTLES DRAWN AEROBIC AND ANAEROBIC Blood Culture adequate volume Performed at [place] [address], [place], [place] [address]    Culture   Final    NO GROWTH 3 DAYS Performed at [facility], [place] [address], [place], [place] [address]    Report Status PENDING  Incomplete  Blood culture (routine x 2)     Status: None (Preliminary result)   Collection Time: 09/12/20  4:40 PM   Specimen: BLOOD  Result Value Ref Range Status   Specimen Description   Final    BLOOD RIGHT WRIST Performed at [place] [address], [place], [place] [address]    Special Requests   Final    BOTTLES DRAWN AEROBIC AND ANAEROBIC Blood Culture adequate volume Performed at  [place] [address] [place], [place] [address]    Culture   Final    NO GROWTH 3 DAYS Performed at [facility], [place] [address] [place], [place] [address]    Report Status PENDING  Incomplete  Culture, sputum-assessment     Status: None   Collection Time: 09/13/20  4:29 AM   Specimen: Expectorated Sputum  Result Value Ref Range Status   Specimen Description EXPECTORATED SPUTUM  Final   Special Requests NONE  Final   Sputum evaluation   Final    THIS SPECIMEN IS ACCEPTABLE FOR SPUTUM CULTURE Performed at [facility], [place] [address], [place], [place] [address]    Report Status 09/13/2020 FINAL  Final  Culture, respiratory     Status: None   Collection Time: 09/13/20  4:29 AM  Result Value Ref Range Status   Specimen Description   Final    EXPECTORATED SPUTUM Performed at [place] [address] [place], [place] [address]    Special Requests   Final    NONE Reflexed from [id number] Performed at [place] [address], [place], [place] [address]    Gram Stain   Final    RARE WBC PRESENT, PREDOMINANTLY PMN RARE GRAM POSITIVE COCCI    Culture   Final    Normal respiratory flora-no Staph aureus or Pseudomonas seen Performed at [place] [address] [place], [place] [address]    Report Status 09/15/2020 FINAL  Final  MRSA PCR Screening     Status: None   Collection Time: 09/13/20  2:00 PM   Specimen: Nasopharyngeal  Result Value Ref Range Status   MRSA by PCR NEGATIVE NEGATIVE Final    Comment:        The GeneXpert MRSA Assay (FDA approved for NASAL specimens only), is one component of a comprehensive MRSA colonization surveillance program. It is not intended to diagnose MRSA infection nor to guide or monitor treatment for MRSA infections. Performed at [facility], [place] [address] [place], [place] [address]      Discharge  Instructions:  Discharge Instructions     Diet - low sodium heart healthy   Complete by: As directed    Dysphagia III   Discharge instructions   Complete by: As directed    Follow-up with your primary care physician in 1 week.  Follow-up with your oncologist as scheduled by you.  Check blood work in the next visit.  Complete the course of prednisone and antibiotic.   Increase activity slowly   Complete by: As directed       Allergies as of 09/16/2020       Reactions   Tudorza Pressair [aclidinium Bromide] Itching   Amitriptyline Other (See Comments)   Eye spasms per pt   Codeine Nausea Only   Tramadol Nausea Only        Medication List     TAKE these medications    albuterol 108 (90 Base) MCG/ACT inhaler Commonly known as: VENTOLIN HFA INHALE 2 PUFFS INTO THE LUNGS EVERY 6 HOURS AS NEEDED FOR WHEEZING OR SHORTNESS OF BREATH What changed: See the new instructions.   albuterol (2.5 MG/3ML) 0.083% nebulizer solution Commonly known as: PROVENTIL Take 3 mLs (2.5 mg total) by nebulization every 6 (six) hours as needed for wheezing or shortness of breath. What changed: Another medication with the same name was changed. Make sure you understand how and when to take each.   Bevespi Aerosphere 9-4.8 MCG/ACT Aero Generic drug: Glycopyrrolate-Formoterol INHALE 2 PUFFS INTO THE LUNGS TWICE DAILY What changed: when to take this   cefdinir 300 MG capsule Commonly known as: OMNICEF Take 1 capsule (300 mg total) by mouth 2 (two) times daily for 4 days.   chlorhexidine 0.12 % solution Commonly known as: Peridex Use as directed 15 mLs in the mouth or throat 2 (two) times daily.   dexamethasone 4 MG tablet Commonly known as: DECADRON Take 2 tablets (8 mg total) by mouth daily. Take daily x 3 days starting the day after cisplatin chemotherapy. Take with food.   lactose free nutrition Liqd Take 237 mLs by mouth 3 (three) times daily between meals. What changed: Another  medication with the same name was changed. Make sure you understand how and when to take each.   feeding supplement (OSMOLITE 1.5 CAL) Liqd Give 1 1/2 carton of osmolite 1.5 4 times per day (8am, noon, 4pm and 8pm) via feeding tube.  Flush with 72ml of water before and after feeding.  Drink or give additional 220ml TID of water between feedings. What changed:  how much to take how to take this when to take this   guaiFENesin-dextromethorphan 100-10 MG/5ML syrup Commonly known as: ROBITUSSIN DM Take 10 mLs by mouth every 4 (four) hours as needed for cough.   K-Phos-Neutral [id number] MG Tabs Take 2 tablets by mouth in the morning, at noon, in the evening, and at bedtime. Ok to dissolve tablets in 8 oz of water and put in feeding tube.   lidocaine 2 % solution Commonly known as: XYLOCAINE Patient: Mix 1part 2% viscous lidocaine, 1part H20. Swallow 48mL of diluted mixture, 62min before meals and at bedtime, up to QID What changed:  how much to take how to take this when to take this   loperamide 2 MG capsule Commonly known as: IMODIUM Take 1 capsule (2 mg total) by mouth as needed for diarrhea or loose stools.   ondansetron 8 MG tablet Commonly known as: Zofran Take 1 tablet (8 mg total) by mouth 2 (two) times daily as needed. Start on the third day  after cisplatin chemotherapy. What changed: reasons to take this   pantoprazole 40 MG tablet Commonly known as: Protonix Take 1 tablet (40 mg total) by mouth daily for 10 days.   predniSONE 50 MG tablet Commonly known as: DELTASONE Take 1 tablet (50 mg total) by mouth daily with breakfast for 7 days.   prochlorperazine 10 MG tablet Commonly known as: COMPAZINE Take 1 tablet (10 mg total) by mouth every 6 (six) hours as needed (Nausea or vomiting). What changed: reasons to take this        Follow-up Information     Ladell Pier, MD. Schedule an appointment as soon as possible for a visit in 1 week(s).   Specialty:  Internal Medicine Why: regular followup, blood work check Contact information: [place] [address] [phone]         Pixie Casino, MD .   Specialty: Cardiology Contact information: [place] [address] [phone]                  Time coordinating discharge: 39 minutes  Signed:  Avondre Richens  [facility] 09/16/2020, 10:32 AM

## 2020-09-17 LAB — CULTURE, BLOOD (ROUTINE X 2)
Culture: NO GROWTH
Culture: NO GROWTH
Special Requests: ADEQUATE
Special Requests: ADEQUATE

## 2020-09-18 ENCOUNTER — Ambulatory Visit
Admission: RE | Admit: 2020-09-18 | Discharge: 2020-09-18 | Disposition: A | Discharge status: Home / Self Care | Payer: Medicare Other | Source: Ambulatory Visit | Attending: Radiation Oncology | Admitting: Radiation Oncology

## 2020-09-18 ENCOUNTER — Ambulatory Visit: Payer: Medicare Other | Admitting: Physical Therapy

## 2020-09-18 ENCOUNTER — Ambulatory Visit: Payer: Medicare Other

## 2020-09-18 ENCOUNTER — Telehealth: Payer: Self-pay

## 2020-09-18 DIAGNOSIS — C321 Malignant neoplasm of supraglottis: Secondary | ICD-10-CM | POA: Diagnosis not present

## 2020-09-18 DIAGNOSIS — Z95828 Presence of other vascular implants and grafts: Secondary | ICD-10-CM | POA: Diagnosis not present

## 2020-09-18 DIAGNOSIS — C329 Malignant neoplasm of larynx, unspecified: Secondary | ICD-10-CM | POA: Diagnosis not present

## 2020-09-18 NOTE — Telephone Encounter (Signed)
Transition Care Management Unsuccessful Follow-up Telephone Call  Date of discharge and from where:  [facility]  Attempts:  1 st   Reason for unsuccessful TCM follow-up call:  Called pt unable to reach, left VM to call back this nurse or Jane CM.name and contact provided.  Pt needs HFU follow up appt to schedule with PCP

## 2020-09-19 ENCOUNTER — Telehealth: Payer: Self-pay

## 2020-09-19 ENCOUNTER — Ambulatory Visit
Admission: RE | Admit: 2020-09-19 | Discharge: 2020-09-19 | Disposition: A | Discharge status: Home / Self Care | Payer: Medicare Other | Source: Ambulatory Visit | Attending: Radiation Oncology | Admitting: Radiation Oncology

## 2020-09-19 ENCOUNTER — Encounter: Payer: Medicare Other | Admitting: Nutrition

## 2020-09-19 DIAGNOSIS — C321 Malignant neoplasm of supraglottis: Secondary | ICD-10-CM | POA: Diagnosis not present

## 2020-09-19 DIAGNOSIS — Z95828 Presence of other vascular implants and grafts: Secondary | ICD-10-CM | POA: Diagnosis not present

## 2020-09-19 DIAGNOSIS — C329 Malignant neoplasm of larynx, unspecified: Secondary | ICD-10-CM | POA: Diagnosis not present

## 2020-09-19 NOTE — Telephone Encounter (Signed)
Transition Care Management Follow-up Telephone Call  Date of discharge and from where: 09/16/2020, [facility]   How have you been since you were released from the hospital? She said she is feeling okay, tired, trying to recuperate  Any questions or concerns? No  Items Reviewed:  Did the pt receive and understand the discharge instructions provided? Yes   Medications obtained and verified? she said she has the cefdinir, peridex, protonix and prednisone as well as the medications she was taking prior to her hospitalization.  She also has some couph medicine but not the imodium.  She has her inhalers but no albuterol neblizer solution. She did not have any questions about her med regime   Other? No   Any new allergies since your discharge? No   Dietary orders reviewed? Yes - she explained that she is tolerating soft , bland diet orally as well as thin liquids.. She is taking Boost and water via peg tube. osmolite is listed on her AVS but she said that is what was causing her diarrhea so she stopped that and is now taking Boost instead.   Do you have support at home? Yes  - her daughter and grandson  Home Care and Equipment/Supplies: Were home health services ordered? no If so, what is the name of the agency? n/a  Has the agency set up a time to come to the patient's home? not applicable Were any new equipment or medical supplies ordered?  No What is the name of the medical supply agency? n/a Were you able to get the supplies/equipment? not applicable Do you have any questions related to the use of the equipment or supplies? No  Functional Questionnaire: (I = Independent and D = Dependent) ADLs: independent  Follow up appointments reviewed:   PCP Hospital f/u appt confirmed? Yes  - Dr Wynetta Emery 09/28/2020.   [facility] f/u appt confirmed? Yes  - multiple appointments for radiation,including this afternoon. Oncologist 10/10/2020.  ENT - 11/08/2020.   Are transportation  arrangements needed? No  she said she drives and wants to drive. She doesn't want [facility] transportation   If their condition worsens, is the pt aware to call PCP or go to the Emergency Dept.? Yes  Was the patient provided with contact information for the PCP's office or ED? Yes  Was to pt encouraged to call back with questions or concerns? Yes

## 2020-09-20 ENCOUNTER — Ambulatory Visit
Admission: RE | Admit: 2020-09-20 | Discharge: 2020-09-20 | Disposition: A | Discharge status: Home / Self Care | Payer: Medicare Other | Source: Ambulatory Visit | Attending: Radiation Oncology | Admitting: Radiation Oncology

## 2020-09-20 ENCOUNTER — Ambulatory Visit: Payer: Medicare Other

## 2020-09-20 DIAGNOSIS — C329 Malignant neoplasm of larynx, unspecified: Secondary | ICD-10-CM | POA: Diagnosis not present

## 2020-09-20 DIAGNOSIS — C321 Malignant neoplasm of supraglottis: Secondary | ICD-10-CM | POA: Diagnosis not present

## 2020-09-20 DIAGNOSIS — Z95828 Presence of other vascular implants and grafts: Secondary | ICD-10-CM | POA: Diagnosis not present

## 2020-09-21 ENCOUNTER — Ambulatory Visit
Admission: RE | Admit: 2020-09-21 | Discharge: 2020-09-21 | Disposition: A | Discharge status: Home / Self Care | Payer: Medicare Other | Source: Ambulatory Visit | Attending: Radiation Oncology | Admitting: Radiation Oncology

## 2020-09-21 ENCOUNTER — Ambulatory Visit: Payer: Medicare Other

## 2020-09-21 DIAGNOSIS — Z95828 Presence of other vascular implants and grafts: Secondary | ICD-10-CM | POA: Diagnosis not present

## 2020-09-21 DIAGNOSIS — C329 Malignant neoplasm of larynx, unspecified: Secondary | ICD-10-CM | POA: Diagnosis not present

## 2020-09-21 DIAGNOSIS — C321 Malignant neoplasm of supraglottis: Secondary | ICD-10-CM | POA: Diagnosis not present

## 2020-09-22 ENCOUNTER — Ambulatory Visit
Admission: RE | Admit: 2020-09-22 | Discharge: 2020-09-22 | Disposition: A | Discharge status: Home / Self Care | Payer: Medicare Other | Source: Ambulatory Visit | Attending: Radiation Oncology | Admitting: Radiation Oncology

## 2020-09-22 ENCOUNTER — Ambulatory Visit: Payer: Medicare Other

## 2020-09-22 DIAGNOSIS — Z95828 Presence of other vascular implants and grafts: Secondary | ICD-10-CM | POA: Diagnosis not present

## 2020-09-22 DIAGNOSIS — C321 Malignant neoplasm of supraglottis: Secondary | ICD-10-CM | POA: Diagnosis not present

## 2020-09-22 DIAGNOSIS — C329 Malignant neoplasm of larynx, unspecified: Secondary | ICD-10-CM | POA: Diagnosis not present

## 2020-09-25 ENCOUNTER — Ambulatory Visit
Admission: RE | Admit: 2020-09-25 | Discharge: 2020-09-25 | Disposition: A | Discharge status: Home / Self Care | Payer: Medicare Other | Source: Ambulatory Visit | Attending: Radiation Oncology | Admitting: Radiation Oncology

## 2020-09-25 ENCOUNTER — Other Ambulatory Visit: Payer: Self-pay

## 2020-09-25 ENCOUNTER — Encounter: Payer: Self-pay | Admitting: Radiation Oncology

## 2020-09-25 DIAGNOSIS — C329 Malignant neoplasm of larynx, unspecified: Secondary | ICD-10-CM

## 2020-09-25 DIAGNOSIS — C321 Malignant neoplasm of supraglottis: Secondary | ICD-10-CM

## 2020-09-25 DIAGNOSIS — Z95828 Presence of other vascular implants and grafts: Secondary | ICD-10-CM | POA: Diagnosis not present

## 2020-09-25 DIAGNOSIS — J441 Chronic obstructive pulmonary disease with (acute) exacerbation: Secondary | ICD-10-CM | POA: Diagnosis not present

## 2020-09-25 LAB — BASIC METABOLIC PANEL - CANCER CENTER ONLY
Anion gap: 10 (ref 5–15)
BUN: 9 mg/dL (ref 8–23)
CO2: 25 mmol/L (ref 22–32)
Calcium: 8.4 mg/dL — ABNORMAL LOW (ref 8.9–10.3)
Chloride: 103 mmol/L (ref 98–111)
Creatinine: 0.68 mg/dL (ref 0.44–1.00)
GFR, Estimated: >60 mL/min
Glucose, Bld: 153 mg/dL — ABNORMAL HIGH (ref 70–99)
Potassium: 3.5 mmol/L (ref 3.5–5.1)
Sodium: 138 mmol/L (ref 135–145)

## 2020-09-25 MED ORDER — HEPARIN SOD (PORK) LOCK FLUSH 100 UNIT/ML IV SOLN
500.0000 [IU] | Freq: Once | INTRAVENOUS | Status: AC
  Administered 2020-09-25: 500 [IU] via INTRAVENOUS

## 2020-09-25 MED ORDER — SODIUM CHLORIDE 0.9% FLUSH
10.0000 mL | Freq: Once | INTRAVENOUS | Status: AC
  Administered 2020-09-25: 10 mL via INTRAVENOUS

## 2020-09-25 NOTE — Progress Notes (Signed)
PAC accessed on L4 for BMP to be collected. Sterile technique used, blood return noted, 81mL blood wasted, PAC then flushed with 2mL saline and 19mL heparin, and deacessed. Bandaid applied. BMP handed off to [place] in lab.

## 2020-09-26 ENCOUNTER — Telehealth: Payer: Self-pay

## 2020-09-26 LAB — MAGNESIUM: Magnesium: 1.3 mg/dL — ABNORMAL LOW (ref 1.7–2.4)

## 2020-09-26 NOTE — Telephone Encounter (Signed)
Called patient and left a VM letting her know that per Dr. Isidore Moos her lab work from yesterday appeared satisfactory. Encouraged her to continue pushing clear fluids (either by mouth or via PEG tube). Provided my direct call back number should she have any questions/concerns. Also informed her that Jennifer-H&N Navigator will be fallowing up with her medical oncologist and dietician to make sure she has continued follow-up appointments.

## 2020-09-27 ENCOUNTER — Other Ambulatory Visit: Payer: Self-pay | Admitting: Hematology and Oncology

## 2020-09-27 ENCOUNTER — Telehealth: Payer: Self-pay | Admitting: Hematology and Oncology

## 2020-09-27 NOTE — Telephone Encounter (Signed)
Scheduled appointment per 2/23 inbasket msg from MD. Spoke to patient who is aware of appointment date and time.

## 2020-09-27 NOTE — Progress Notes (Signed)
Oncology Nurse Navigator Documentation  I called and spoke with Raven Torres. She is recovering from her recent treatment at [facility]. Her voice is very hoarse, she is eating a little amount of softer foods and using her PEG for nutrition. She reports "appointment fatigue" but is aware of her upcoming appointments with Dr. Isidore Moos, nutrition, and Dr. Chryl Heck. I offered to schedule an appointment with Garald Balding SLP in the future but she declines at this time. I will meet with her when she sees Dr. Isidore Moos and follow up then regarding Swallowing Therapy and she is agreeable to this. She knows to call me if she has any needs or concerns until then.   Harlow Asa RN, BSN, OCN Head & Neck Oncology Nurse [place] at [facility] Phone # [phone]  Fax # [phone]

## 2020-09-28 ENCOUNTER — Other Ambulatory Visit: Payer: Self-pay

## 2020-09-28 ENCOUNTER — Ambulatory Visit: Payer: Medicare Other | Attending: Internal Medicine | Admitting: Internal Medicine

## 2020-09-28 DIAGNOSIS — Z09 Encounter for follow-up examination after completed treatment for conditions other than malignant neoplasm: Secondary | ICD-10-CM | POA: Diagnosis not present

## 2020-09-28 DIAGNOSIS — E43 Unspecified severe protein-calorie malnutrition: Secondary | ICD-10-CM | POA: Diagnosis not present

## 2020-09-28 DIAGNOSIS — C321 Malignant neoplasm of supraglottis: Secondary | ICD-10-CM | POA: Diagnosis not present

## 2020-09-28 NOTE — Progress Notes (Signed)
Virtual Visit via Telephone Note Per my CMA, patient had presented for in person visit but left within a few minutes stating that she was feeling tired and exhausted and wanted me to call her at home later in the day to do a telephone visit instead.  I connected with Raven Torres on 09/28/20 at 4:45 p.m by telephone and verified that [name] speaking with the correct person using two identifiers.  Location: Patient: home Provider: office The pt, my CMA Ms. Raven Torres and myself participated in this pt's encounter   I discussed the limitations, risks, security and privacy concerns of performing an evaluation and management service by telephone and the availability of in person appointments. I also discussed with the patient that there may be a patient responsible charge related to this service. The patient expressed understanding and agreed to proceed.   History of Present Illness: Patient with history of supraglottic laryngeal CA, HTN, cured hepatitis C with cirrhosis, adjustment disorder, HL, tobacco dependence, and rheumatoid arthritis/fibromyalgia(Dr. Posey Pronto at [facility]) , memory decline(MMSE 05/2019 was 30/30), COPD GOLD III, lung nodules (Dr. Vaughan Browner), coronary atherosclerosis.  Purpose of today's visit is transition of care Date of Admission: Date of call from CW: 09/19/2020  Patient was admitted with symptomatic static hypotension.  She was sent to the hospital from radiation oncology.  She complained of dizziness, palpitations and generalized weakness.  She had orthostatic hypotension on presentation.  Also found to have hypokalemia, hypomagnesemia, leukopenia and thrombocytopenia.  She was given IV fluid boluses with improvement.  Potassium and magnesium were replaced.  Found to have COVID-19 infection with left lower lobe pneumonia.  Treated with 3 days of remdesivir.  Received IV antibiotics.  She was made DNR.  Discharged home on [place].  Blood cultures negative.  Blood cell counts were  improving by the time of discharge.  Today: Patient reports that she is done with XRT and chemotherapy.  She has loss more weight since I last saw her.  In January she was 140 pounds.  Weight today was 119 pounds.  She has a hard time swallowing and feels fatigue all the time.  Throat is sore from XRT.  He sips on water during the day.  She is still trying to maintain intake orally as much as possible.  She has the feeding tube but she does not like using it.  She gives herself 1 can of Ensure daily through it and flushes with 60 cc of water before and after the Ensure.  Albumin level is 2.2.  Outpatient Encounter Medications as of 09/28/2020  Medication Sig  . albuterol (PROVENTIL) (2.5 MG/3ML) 0.083% nebulizer solution Take 3 mLs (2.5 mg total) by nebulization every 6 (six) hours as needed for wheezing or shortness of breath.  Marland Kitchen albuterol (VENTOLIN HFA) 108 (90 Base) MCG/ACT inhaler INHALE 2 PUFFS INTO THE LUNGS EVERY 6 HOURS AS NEEDED FOR WHEEZING OR SHORTNESS OF BREATH (Patient taking differently: Inhale 2 puffs into the lungs every 6 (six) hours as needed for wheezing or shortness of breath.)  . BEVESPI AEROSPHERE 9-4.8 MCG/ACT AERO INHALE 2 PUFFS INTO THE LUNGS TWICE DAILY (Patient taking differently: Inhale 2 puffs into the lungs in the morning and at bedtime.)  . chlorhexidine (PERIDEX) 0.12 % solution Use as directed 15 mLs in the mouth or throat 2 (two) times daily.  Marland Kitchen dexamethasone (DECADRON) 4 MG tablet Take 2 tablets (8 mg total) by mouth daily. Take daily x 3 days starting the day after cisplatin chemotherapy. Take with  food.  . guaiFENesin-dextromethorphan (ROBITUSSIN DM) 100-10 MG/5ML syrup Take 10 mLs by mouth every 4 (four) hours as needed for cough.  . K Phos Mono-Sod Phos Di & Mono (K-PHOS-NEUTRAL) [phone] MG TABS Take 2 tablets by mouth in the morning, at noon, in the evening, and at bedtime. Ok to dissolve tablets in 8 oz of water and put in feeding tube.  . lactose free  nutrition (BOOST) LIQD Take 237 mLs by mouth 3 (three) times daily between meals.  . lidocaine (XYLOCAINE) 2 % solution Patient: Mix 1part 2% viscous lidocaine, 1part H20. Swallow 47mL of diluted mixture, 28min before meals and at bedtime, up to QID (Patient taking differently: Use as directed 15 mLs in the mouth or throat in the morning, at noon, in the evening, and at bedtime. Patient: Mix 1part 2% viscous lidocaine, 1part H20. Swallow 52mL of diluted mixture, 63min before meals and at bedtime, up to QID)  . loperamide (IMODIUM) 2 MG capsule Take 1 capsule (2 mg total) by mouth as needed for diarrhea or loose stools.  . Nutritional Supplements (FEEDING SUPPLEMENT, OSMOLITE 1.5 CAL,) LIQD Give 1 1/2 carton of osmolite 1.5 4 times per day (8am, noon, 4pm and 8pm) via feeding tube.  Flush with 108ml of water before and after feeding.  Drink or give additional 275ml TID of water between feedings. (Patient taking differently: Place 237 mLs into feeding tube See admin instructions. Give 1 1/2 carton of osmolite 1.5 4 times per day (8am, noon, 4pm and 8pm) via feeding tube.  Flush with 26ml of water before and after feeding.  Drink or give additional 253ml TID of water between feedings.)  . ondansetron (ZOFRAN) 8 MG tablet Take 1 tablet (8 mg total) by mouth 2 (two) times daily as needed. Start on the third day after cisplatin chemotherapy. (Patient taking differently: Take 8 mg by mouth 2 (two) times daily as needed for refractory nausea / vomiting. Start on the third day after cisplatin chemotherapy.)  . pantoprazole (PROTONIX) 40 MG tablet Take 1 tablet (40 mg total) by mouth daily for 10 days.  . prochlorperazine (COMPAZINE) 10 MG tablet Take 1 tablet (10 mg total) by mouth every 6 (six) hours as needed (Nausea or vomiting). (Patient taking differently: Take 10 mg by mouth every 6 (six) hours as needed for nausea or vomiting (Nausea or vomiting).)   No facility-administered encounter medications on file as of  09/28/2020.    Observations/Objective: Patient sounded hoarse    Chemistry      Component Value Date/Time   NA 138 09/25/2020 1607   NA 139 06/02/2020 0845   K 3.5 09/25/2020 1607   CL 103 09/25/2020 1607   CO2 25 09/25/2020 1607   BUN 9 09/25/2020 1607   BUN 12 06/02/2020 0845   CREATININE 0.68 09/25/2020 1607   CREATININE 0.70 02/26/2016 1143      Component Value Date/Time   CALCIUM 8.4 (L) 09/25/2020 1607   ALKPHOS 39 09/16/2020 0310   AST 23 09/16/2020 0310   ALT 15 09/16/2020 0310   BILITOT 0.6 09/16/2020 0310   BILITOT 0.5 06/02/2020 0845     Lab Results  Component Value Date   WBC 2.3 (L) 09/16/2020   HGB 8.3 (L) 09/16/2020   HCT 25.1 (L) 09/16/2020   MCV 95.1 09/16/2020   PLT 134 (L) 09/16/2020   Recent magnesium level was 1.33 days ago.  Assessment and Plan:  1. Hospital discharge follow-up   2. Severe protein-calorie malnutrition ([place]) Advised patient to  use her feeding tube as much as possible since she is having problems swallowing from the radiation treatments that she has received.  Concern is to avoid aspirating.  Advised to do water flushes below 250 mL 6 times a day.  On looking at her med list, it was recommended that she has at least 3 cans of Ensure was a day.  I recommended referral to a nutritionist but patient states that she already sees 1 through the cancer center.  3. Malignant neoplasm of supraglottis (HCC)  Follow Up Instructions: 2 months.   I discussed the assessment and treatment plan with the patient. The patient was provided an opportunity to ask questions and all were answered. The patient agreed with the plan and demonstrated an understanding of the instructions.   The patient was advised to call back or seek an in-person evaluation if the symptoms worsen or if the condition fails to improve as anticipated.  I provided 16 minutes of non-face-to-face time during this encounter.   Karle Plumber, MD

## 2020-10-04 ENCOUNTER — Other Ambulatory Visit: Payer: Self-pay | Admitting: Internal Medicine

## 2020-10-04 DIAGNOSIS — I10 Essential (primary) hypertension: Secondary | ICD-10-CM

## 2020-10-10 ENCOUNTER — Ambulatory Visit
Admission: RE | Admit: 2020-10-10 | Discharge: 2020-10-10 | Disposition: A | Discharge status: Home / Self Care | Payer: Medicare Other | Source: Ambulatory Visit | Attending: Radiation Oncology | Admitting: Radiation Oncology

## 2020-10-10 ENCOUNTER — Other Ambulatory Visit: Payer: Self-pay

## 2020-10-10 ENCOUNTER — Inpatient Hospital Stay: Payer: Medicare Other | Attending: Hematology and Oncology | Admitting: Nutrition

## 2020-10-10 VITALS — BP 97/53 | HR 107 | Temp 97.8°F | Resp 19 | Wt 118.4 lb

## 2020-10-10 DIAGNOSIS — C321 Malignant neoplasm of supraglottis: Secondary | ICD-10-CM | POA: Insufficient documentation

## 2020-10-10 DIAGNOSIS — F17210 Nicotine dependence, cigarettes, uncomplicated: Secondary | ICD-10-CM | POA: Insufficient documentation

## 2020-10-10 DIAGNOSIS — Z923 Personal history of irradiation: Secondary | ICD-10-CM | POA: Diagnosis not present

## 2020-10-10 DIAGNOSIS — R634 Abnormal weight loss: Secondary | ICD-10-CM | POA: Diagnosis not present

## 2020-10-10 DIAGNOSIS — Z79899 Other long term (current) drug therapy: Secondary | ICD-10-CM | POA: Insufficient documentation

## 2020-10-10 DIAGNOSIS — J449 Chronic obstructive pulmonary disease, unspecified: Secondary | ICD-10-CM | POA: Insufficient documentation

## 2020-10-10 DIAGNOSIS — C329 Malignant neoplasm of larynx, unspecified: Secondary | ICD-10-CM

## 2020-10-10 DIAGNOSIS — K7460 Unspecified cirrhosis of liver: Secondary | ICD-10-CM | POA: Insufficient documentation

## 2020-10-10 DIAGNOSIS — R0989 Other specified symptoms and signs involving the circulatory and respiratory systems: Secondary | ICD-10-CM | POA: Insufficient documentation

## 2020-10-10 DIAGNOSIS — C774 Secondary and unspecified malignant neoplasm of inguinal and lower limb lymph nodes: Secondary | ICD-10-CM | POA: Insufficient documentation

## 2020-10-10 DIAGNOSIS — M069 Rheumatoid arthritis, unspecified: Secondary | ICD-10-CM | POA: Insufficient documentation

## 2020-10-10 LAB — CBC WITH DIFFERENTIAL/PLATELET
Abs Immature Granulocytes: 0.02 10*3/uL (ref 0.00–0.07)
Basophils Absolute: 0 10*3/uL (ref 0.0–0.1)
Basophils Relative: 0 %
Eosinophils Absolute: 0.1 10*3/uL (ref 0.0–0.5)
Eosinophils Relative: 1 %
HCT: 35.8 % — ABNORMAL LOW (ref 36.0–46.0)
Hemoglobin: 11.6 g/dL — ABNORMAL LOW (ref 12.0–15.0)
Immature Granulocytes: 0 %
Lymphocytes Relative: 13 %
Lymphs Abs: 1.3 10*3/uL (ref 0.7–4.0)
MCH: 31.9 pg (ref 26.0–34.0)
MCHC: 32.4 g/dL (ref 30.0–36.0)
MCV: 98.4 fL (ref 80.0–100.0)
Monocytes Absolute: 1 10*3/uL (ref 0.1–1.0)
Monocytes Relative: 10 %
Neutro Abs: 7.2 10*3/uL (ref 1.7–7.7)
Neutrophils Relative %: 76 %
Platelets: 134 10*3/uL — ABNORMAL LOW (ref 150–400)
RBC: 3.64 MIL/uL — ABNORMAL LOW (ref 3.87–5.11)
RDW: 16.9 % — ABNORMAL HIGH (ref 11.5–15.5)
WBC: 9.6 10*3/uL (ref 4.0–10.5)
nRBC: 0 % (ref 0.0–0.2)

## 2020-10-10 LAB — BASIC METABOLIC PANEL
Anion gap: 10 (ref 5–15)
BUN: 16 mg/dL (ref 8–23)
CO2: 26 mmol/L (ref 22–32)
Calcium: 9.5 mg/dL (ref 8.9–10.3)
Chloride: 103 mmol/L (ref 98–111)
Creatinine, Ser: 0.75 mg/dL (ref 0.44–1.00)
GFR, Estimated: >60 mL/min (ref >60)
Glucose, Bld: 109 mg/dL — ABNORMAL HIGH (ref 70–99)
Potassium: 4.5 mmol/L (ref 3.5–5.1)
Sodium: 139 mmol/L (ref 135–145)

## 2020-10-10 LAB — MAGNESIUM: Magnesium: 1.7 mg/dL (ref 1.7–2.4)

## 2020-10-10 NOTE — Progress Notes (Signed)
Nutrition follow-up completed with patient status post treatment for laryngeal cancer.  Patient had feeding tube placed on 07/27/2020.  Patient hates her feeding tube and does not want to use it.  Reports she has been using 2 cartons of boost via tube in the morning and tries to drink 1 boost at night.  She refuses Osmolite 1.5.  Reports she tolerates very soft foods such as eggs and vegetables.  She occasionally will eat cream soup.  Weight decreased again to 118.38 pounds today from 125.4 pounds February 21.  Labs are pending.  Nutrition diagnosis: Inadequate oral energy intake continues.  Intervention: Stressed the importance of increasing oral intake to minimize weight loss.  Recommended patient try to just drink plain 2% milk which she says she likes.  Reviewed other soft moist foods within patient's food preferences.  Try to encourage her to add an additional carton of boost however she is refusing at this time.  Offered samples however she declines.  Monitoring, evaluation, goals: Will monitor oral intake and weight trends.    Next follow-up in approximately 1 month by telephone.  **Disclaimer: This note was dictated with voice recognition software. Similar sounding words can inadvertently be transcribed and this note may contain transcription errors which may not have been corrected upon publication of note.**

## 2020-10-10 NOTE — Progress Notes (Signed)
Ms. Mireles presents today for 2 week follow-up after completing radiation to her supraglottis on 09/25/2020  Pain issues, if any: Reports ongoing throat pain due to ongoing coughing/congestion. Using a feeding tube?: Yes--patient reports she is doing 3 feedings a day (usually 1 carton at each feeding). Scheduled to meet with [name] later this afternoon Weight changes, if any:  Wt Readings from Last 3 Encounters:  10/10/20 118 lb 6 oz (53.7 kg)  09/12/20 126 lb 12.2 oz (57.5 kg)  09/07/20 127 lb 4 oz (57.7 kg)   Swallowing issues, if any: Yes--only able to tolerate very soft foods or liquids currently. Reports she occasionally gets choked up or feel like the liquids go down the wrong way Smoking or chewing tobacco? Continues to smoke a few cigarettes throughout the week Using fluoride trays daily? N/A--has dentures Last ENT visit was on: Not since diagnosis Other notable issues, if any: Continues to deal with dry mouth and thick saliva (phlegm is patient's biggest complaint). Denies any ear or jaw pain, but does report her right inner ear itches/bothers her. Denies any difficulty opening her mouth fully, and states she does her neck/trimus exercises daily. Denies any issues falling asleep or staying asleep--reports she still feels fatigued. Skin in treatment field appears intact with a few dry patches. Scheduled for F/U with Dr. Chryl Heck on 10/18/2020  Vitals:   10/10/20 1452  BP: (!) 97/53  Pulse: (!) 107  Resp: 19  Temp: 97.8 F (36.6 C)  SpO2: 97%

## 2020-10-10 NOTE — Progress Notes (Signed)
Oncology Nurse Navigator Documentation  I met with Ms. Munley during her follow up appointment with Dr. Isidore Moos today. She is feeling well and reports her energy has improved since completing her radiation. She is eating softer foods orally and continues to use her PEG for nutrition as well. She has been scheduled for a follow up with Dr. Isidore Moos in June to receive results of a PET scan completed the day before. She will see Dr. Chryl Heck next week and has agreed to see Garald Balding SLP on 3/17 during [place] here at the [facility]. She is aware that I will call her to remind her of these appointments. She knows to call me if she has any needs or questions before then.   Harlow Asa RN, BSN, OCN Head & Neck Oncology Nurse [place] at [facility] Phone # [phone]  Fax # [phone]

## 2020-10-13 ENCOUNTER — Encounter: Payer: Self-pay | Admitting: Radiation Oncology

## 2020-10-13 NOTE — Progress Notes (Signed)
Radiation Oncology         [phone] ________________________________  Name: Raven Torres MRN: [id number]  Date: 10/10/2020  DOB: 1956-01-12  Follow-Up Visit Note  CC: Raven Pier, MD  Rozetta Nunnery, *  Diagnosis and Prior Radiotherapy:       ICD-10-CM   1. Malignant neoplasm of supraglottis [facility])  C32.1    Cancer Staging Malignant neoplasm of supraglottis [facility]) Staging form: Larynx - Supraglottis, AJCC 8th Edition - Clinical stage from 07/10/2020: Stage IVA (cT3, cN2a, cM0) - Signed by Eppie Gibson, MD on 07/14/2020 Stage prefix: Initial diagnosis   CHIEF COMPLAINT:  Here for follow-up and surveillance of throat cancer  Narrative:  The patient returns today for routine follow-up.   Raven Torres presents today for 2 week follow-up after completing radiation to her supraglottis on 09/25/2020  Pain issues, if any: Reports ongoing throat pain due to ongoing coughing/congestion. Using a feeding tube?: Yes--patient reports she is doing 3 feedings a day (usually 1 carton at each feeding). Scheduled to meet with [name] later this afternoon Weight changes, if any:  Wt Readings from Last 3 Encounters:  10/10/20 118 lb 6 oz (53.7 kg)  09/12/20 126 lb 12.2 oz (57.5 kg)  09/07/20 127 lb 4 oz (57.7 kg)   Swallowing issues, if any: Yes--only able to tolerate very soft foods or liquids currently. Reports she occasionally gets choked up or feel like the liquids go down the wrong way Smoking or chewing tobacco? Continues to smoke a few cigarettes throughout the week Using fluoride trays daily? N/A--has dentures Last ENT visit was on: Not since diagnosis Other notable issues, if any: Continues to deal with dry mouth and thick saliva (phlegm is patient's biggest complaint). Denies any ear or jaw pain, but does report her right inner ear itches/bothers her. Denies any difficulty opening her mouth fully, and states she does her neck/trimus exercises daily. Denies any  issues falling asleep or staying asleep--reports she still feels fatigued. Skin in treatment field appears intact with a few dry patches. Scheduled for F/U with Dr. Chryl Heck on 10/18/2020  Vitals:   10/10/20 1452  BP: (!) 97/53  Pulse: (!) 107  Resp: 19  Temp: 97.8 F (36.6 C)  SpO2: 97%                        ALLERGIES:  is allergic to tudorza pressair [aclidinium bromide], amitriptyline, codeine, and tramadol.  Meds: Current Outpatient Medications  Medication Sig Dispense Refill  . albuterol (PROVENTIL) (2.5 MG/3ML) 0.083% nebulizer solution Take 3 mLs (2.5 mg total) by nebulization every 6 (six) hours as needed for wheezing or shortness of breath. 150 mL 1  . albuterol (VENTOLIN HFA) 108 (90 Base) MCG/ACT inhaler INHALE 2 PUFFS INTO THE LUNGS EVERY 6 HOURS AS NEEDED FOR WHEEZING OR SHORTNESS OF BREATH (Patient taking differently: Inhale 2 puffs into the lungs every 6 (six) hours as needed for wheezing or shortness of breath.) 8.5 g 3  . BEVESPI AEROSPHERE 9-4.8 MCG/ACT AERO INHALE 2 PUFFS INTO THE LUNGS TWICE DAILY (Patient taking differently: Inhale 2 puffs into the lungs in the morning and at bedtime.) 10.7 g 1  . chlorhexidine (PERIDEX) 0.12 % solution Use as directed 15 mLs in the mouth or throat 2 (two) times daily. 120 mL 0  . dexamethasone (DECADRON) 4 MG tablet Take 2 tablets (8 mg total) by mouth daily. Take daily x 3 days starting the day after cisplatin chemotherapy. Take  with food. 30 tablet 1  . guaiFENesin-dextromethorphan (ROBITUSSIN DM) 100-10 MG/5ML syrup Take 10 mLs by mouth every 4 (four) hours as needed for cough. 118 mL 0  . lactose free nutrition (BOOST) LIQD Take 237 mLs by mouth 3 (three) times daily between meals.    . lidocaine (XYLOCAINE) 2 % solution Patient: Mix 1part 2% viscous lidocaine, 1part H20. Swallow 63mL of diluted mixture, 53min before meals and at bedtime, up to QID (Patient taking differently: Use as directed 15 mLs in the mouth or throat in the  morning, at noon, in the evening, and at bedtime. Patient: Mix 1part 2% viscous lidocaine, 1part H20. Swallow 65mL of diluted mixture, 35min before meals and at bedtime, up to QID) 200 mL 4  . loperamide (IMODIUM) 2 MG capsule Take 1 capsule (2 mg total) by mouth as needed for diarrhea or loose stools. 30 capsule 0  . Nutritional Supplements (FEEDING SUPPLEMENT, OSMOLITE 1.5 CAL,) LIQD Give 1 1/2 carton of osmolite 1.5 4 times per day (8am, noon, 4pm and 8pm) via feeding tube.  Flush with 46ml of water before and after feeding.  Drink or give additional 235ml TID of water between feedings. (Patient taking differently: Place 237 mLs into feeding tube See admin instructions. Give 1 1/2 carton of osmolite 1.5 4 times per day (8am, noon, 4pm and 8pm) via feeding tube.  Flush with 64ml of water before and after feeding.  Drink or give additional 215ml TID of water between feedings.)  0  . ondansetron (ZOFRAN) 8 MG tablet Take 1 tablet (8 mg total) by mouth 2 (two) times daily as needed. Start on the third day after cisplatin chemotherapy. (Patient taking differently: Take 8 mg by mouth 2 (two) times daily as needed for refractory nausea / vomiting. Start on the third day after cisplatin chemotherapy.) 30 tablet 1  . pantoprazole (PROTONIX) 40 MG tablet Take 1 tablet (40 mg total) by mouth daily for 10 days. 10 tablet 0  . prochlorperazine (COMPAZINE) 10 MG tablet Take 1 tablet (10 mg total) by mouth every 6 (six) hours as needed (Nausea or vomiting). (Patient taking differently: Take 10 mg by mouth every 6 (six) hours as needed for nausea or vomiting (Nausea or vomiting).) 30 tablet 1   No current facility-administered medications for this encounter.    Physical Findings: The patient is in no acute distress. Patient is alert and oriented. Wt Readings from Last 3 Encounters:  10/10/20 118 lb 6 oz (53.7 kg)  09/12/20 126 lb 12.2 oz (57.5 kg)  09/07/20 127 lb 4 oz (57.7 kg)    weight is 118 lb 6 oz (53.7  kg). Her oral temperature is 97.8 F (36.6 C). Her blood pressure is 97/53 (abnormal) and her pulse is 107 (abnormal). Her respiration is 19 and oxygen saturation is 97%. .  General: Alert and oriented, in no acute distress HEENT: Head is normocephalic. Extraocular movements are intact. Oropharynx is notable for dry mouth, no thrush or tumor visible in mouth or upper throat Neck: Neck is notable for dry skin  Skin: Skin in treatment fields shows satisfactory healing  MSK ambulatory  Lab Findings: Lab Results  Component Value Date   WBC 9.6 10/10/2020   HGB 11.6 (L) 10/10/2020   HCT 35.8 (L) 10/10/2020   MCV 98.4 10/10/2020   PLT 134 (L) 10/10/2020    Lab Results  Component Value Date   TSH 2.220 01/07/2020   CMP     Component Value Date/Time   NA  139 10/10/2020 1545   NA 139 06/02/2020 0845   K 4.5 10/10/2020 1545   CL 103 10/10/2020 1545   CO2 26 10/10/2020 1545   GLUCOSE 109 (H) 10/10/2020 1545   BUN 16 10/10/2020 1545   BUN 12 06/02/2020 0845   CREATININE 0.75 10/10/2020 1545   CREATININE 0.68 09/25/2020 1607   CREATININE 0.70 02/26/2016 1143   CALCIUM 9.5 10/10/2020 1545   PROT 5.1 (L) 09/16/2020 0310   PROT 6.6 06/02/2020 0845   ALBUMIN 2.2 (L) 09/16/2020 0310   ALBUMIN 3.5 (L) 06/02/2020 0845   AST 23 09/16/2020 0310   ALT 15 09/16/2020 0310   ALKPHOS 39 09/16/2020 0310   BILITOT 0.6 09/16/2020 0310   BILITOT 0.5 06/02/2020 0845   GFRNONAA >60 10/10/2020 1545   GFRNONAA >60 09/25/2020 1607   GFRNONAA >89 08/23/2014 1506   GFRAA 74 06/02/2020 0845   GFRAA >89 08/23/2014 1506    Radiographic Findings: No results found.  Impression/Plan:    1) Head and Neck Cancer Status: healing from ChRT and COVID.  CBCT scans have shown drastic response to treatment.  She is still smoking and nutrition is suboptimal  2) Nutritional Status: losing weight - sees Barb today. PEG tube: intake, urged to use this more   3) Risk Factors: The patient has been educated  about risk factors including alcohol and tobacco abuse; they understand that avoidance of alcohol and tobacco is important to prevent recurrences as well as other cancers  She is still smoking - does not seem motivated to quit but urged to do so.  4) Swallowing: see Cal Schinke this month as scheduled.  5) Dental: edentulous  6) Thyroid function:  Check annually Lab Results  Component Value Date   TSH 2.220 01/07/2020    7) Other: f/u next week with Dr Chryl Heck.  Labs today due to weight loss - to check electrolytes, kidney function  8) Follow-up w [facility] in 3 months. The patient was encouraged to call with any issues or questions before then.  On date of service, in total, I spent 25 minutes on this encounter. Patient was seen in person. _____________________________________   Eppie Gibson, MD

## 2020-10-18 ENCOUNTER — Other Ambulatory Visit: Payer: Self-pay

## 2020-10-18 ENCOUNTER — Encounter: Payer: Self-pay | Admitting: Hematology and Oncology

## 2020-10-18 ENCOUNTER — Inpatient Hospital Stay (HOSPITAL_BASED_OUTPATIENT_CLINIC_OR_DEPARTMENT_OTHER): Payer: Medicare Other | Admitting: Hematology and Oncology

## 2020-10-18 VITALS — BP 113/59 | HR 92 | Temp 96.4°F | Resp 18 | Ht 66.0 in | Wt 121.0 lb

## 2020-10-18 DIAGNOSIS — C321 Malignant neoplasm of supraglottis: Secondary | ICD-10-CM

## 2020-10-18 DIAGNOSIS — F17210 Nicotine dependence, cigarettes, uncomplicated: Secondary | ICD-10-CM | POA: Diagnosis not present

## 2020-10-18 DIAGNOSIS — C774 Secondary and unspecified malignant neoplasm of inguinal and lower limb lymph nodes: Secondary | ICD-10-CM | POA: Diagnosis not present

## 2020-10-18 DIAGNOSIS — K7460 Unspecified cirrhosis of liver: Secondary | ICD-10-CM | POA: Diagnosis not present

## 2020-10-18 DIAGNOSIS — Z79899 Other long term (current) drug therapy: Secondary | ICD-10-CM | POA: Diagnosis not present

## 2020-10-18 DIAGNOSIS — J449 Chronic obstructive pulmonary disease, unspecified: Secondary | ICD-10-CM | POA: Diagnosis not present

## 2020-10-18 DIAGNOSIS — M069 Rheumatoid arthritis, unspecified: Secondary | ICD-10-CM | POA: Diagnosis not present

## 2020-10-18 NOTE — Progress Notes (Signed)
[place] NOTE  Patient Care Team: Ladell Pier, MD as PCP - General (Internal Medicine) Debara Pickett Nadean Corwin, MD as PCP - Cardiology (Cardiology)  CHIEF COMPLAINTS/PURPOSE OF CONSULTATION:   Supraglottic cancer   ASSESSMENT & PLAN:   No problem-specific Assessment & Plan notes found for this encounter.  No orders of the defined types were placed in this encounter.  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 She received five weekly cycles of cisplatin, last cycle 08/25/2020 Clinically she responded, she will be doing her PET CT in a couple months. She will RTC with [place] in May No lingering side effects from chemotherapy.  2. Right ear pain, no findings on ear exam. Would recommend FU with ENT.  RTC in 8 weeks.   Thank you for consulting [place] in the care of this patient.  Please do not hesitate to contact us with any additional questions or concerns.  HISTORY OF PRESENTING ILLNESS:   Raven Torres 65 y.o. female is here because of new diagnosis of laryngeal cancer  Chronology  Raven Torres is a 65 y.o. female who presents for evaluation of weight loss and enlarged right neck node.  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 She has had a approximate 30 pound weight loss over the past 4 months.  She is having no airway problems or  hoarseness.Raven Torres is a 65 y.o. female who presents for evaluation of weight loss and enlarged right neck node. She has had a approximate 30 pound weight loss over the past 4 months.  She has no hoarseness or airway problems. On fiberoptic laryngoscopy she has abnormality of the laryngeal surface of the epiglottis and is taken to the operating room for direct laryngoscopy and biopsy.  06/16/2020 2.0 x 3.2 x 2.5 cm supraglottic laryngeal mass involving the epiglottis and extending along the aryepiglottic folds bilaterally. Tumor extends into the pre-epiglottic fat and likely into the vallecula. Tumor extends into the upper thyroid cartilage bilaterally, abutting and possibly extending through the outer cortex (particularly on the left). This almost certainly reflects a supraglottic laryngeal squamous cell carcinoma  06/26/2020, she had laryngoscopy which showed there was a friable ulcerative tumor involving predominantly the  supraglottic area above the vocal cords as the vocal cords were clear to evaluation.  This extended up the laryngeal surface of epiglottis, more on the right side. A direct laryngoscopy was also performed  of the piriform sinuses which were clear bilaterally.  06/28/2020  1. The supraglottic mass has a maximum SUV of 12.5 and the enlarged right level II lymph node has a maximum SUV of 16.9, both compatible with malignancy. No findings of metastatic disease to the chest, abdomen, pelvis, or regional skeleton. 2. New lingular ground-glass opacity compared to 01/20/2020 with very low-grade activity, probably from alveolitis or low-grade atypical infection. 3. Aortic Atherosclerosis (ICD10-I70.0) and Emphysema (ICD10-J43.9). 4. Airway thickening is present, suggesting bronchitis or reactive airways disease. 5. Hepatic cirrhosis  with left periaortic portosystemic collateral vessels indicating portal venous hypertension. 6.  Prominent stool throughout the colon  favors constipation.  Her case was discussed in the tumor board, and radiology review suggested that there is no definitive evidence of T4 involvement, hence plan was to consider concurrent chemoradiation and thus medical oncology referral.  She has baseline history of COPD, not oxygen dependent. She is able to perform her all ADL's. She says memory has been an issue, she is forgetful at times. She had TB 3 yrs ago and was treated for this at [facility]. She has RA, takes plaquenil and sulfasalazine  Hep C induced cirrhosis, Hep C cured by Interferon, cirrhosis well compensated. She is very anxious, lives with her daughter,  She smoked about 1-2 PPD and is now smoking about 2 packs in a week. No alcohol, only when she was very young. She denies any pain in her neck, swallowing, speech or hearing.  She completed 5 weekly cycles of cisplatin. She is doing really well since she completed radiation. She had gained 3 lbs of weight. Complains of itch in the right ear, no pain. No difficulty swallowing, eating well, still using G tube. She has a skin rash on her forearms wondering if this is related to her liver. No change in breathing, bowel habits or urinary habits.  Rest of the pertinent 10 point ROS reviewed and negative  MEDICAL HISTORY:  Past Medical History:  Diagnosis Date  . Arthritis   . Cataract    removed both eyes   . Colitis   . COPD (chronic obstructive pulmonary disease) ([place])   . Hepatitis C   . Hyperlipidemia   . Hypertension   . Shingles 03/27/2020  . TB (tuberculosis)    treatment     SURGICAL HISTORY: Past Surgical History:  Procedure Laterality Date  . ABDOMINAL HYSTERECTOMY    . CATARACT EXTRACTION, BILATERAL    . COLONOSCOPY    . COLONOSCOPY  04/04/2020  . DIRECT LARYNGOSCOPY N/A 06/26/2020   Procedure: DIRECT LARYNGOSCOPY AND BIOPSY;  Surgeon: Rozetta Nunnery, MD;  Location: [facility];  Service: ENT;  Laterality: N/A;  . IR GASTROSTOMY TUBE MOD  SED  07/27/2020  . IR IMAGING GUIDED PORT INSERTION  07/27/2020  . KNEE SURGERY Right   . POLYPECTOMY  11/17/2008   HPP x 1     SOCIAL HISTORY: Social History   Socioeconomic History  . Marital status: Married    Spouse name: Not on file  . Number of children: Not on file  . Years of education: Not on file  . Highest education level: Not on file  Occupational History  . Not on file  Tobacco Use  . Smoking status: Current Every Day Smoker    Packs/day: 1.00    Years: 50.00    Pack years: 50.00    Types: Cigarettes    Start date: 45  . Smokeless tobacco: Never Used  . Tobacco comment: 4 cigs a day   Vaping Use  . Vaping Use: Never used  Substance and Sexual Activity  . Alcohol use: No    Alcohol/week: 0.0 standard drinks  . Drug use: No  . Sexual activity: Not Currently  Other Topics Concern  . Not on file  Social History Narrative  . Not on file   Social Determinants of Health   Financial Resource Strain: Not on file  Food Insecurity: No Food Insecurity  . Worried About Charity fundraiser in the Last Year: Never true  .  Ran Out of Food in the Last Year: Never true  Transportation Needs: No Transportation Needs  . Lack of Transportation (Medical): No  . Lack of Transportation (Non-Medical): No  Physical Activity: Not on file  Stress: Not on file  Social Connections: Not on file  Intimate Partner Violence: Not on file    FAMILY HISTORY: Family History  Problem Relation Age of Onset  . Heart disease Mother   . Cancer Mother   . Melanoma Mother   . Esophageal cancer Father   . Colon cancer Neg Hx   . Colon polyps Neg Hx   . Rectal cancer Neg Hx   . Stomach cancer Neg Hx     ALLERGIES:  is allergic to tudorza pressair [aclidinium bromide], amitriptyline, codeine, and tramadol.  MEDICATIONS:  Current Outpatient Medications  Medication Sig Dispense Refill  . albuterol (PROVENTIL) (2.5 MG/3ML) 0.083% nebulizer solution Take 3 mLs (2.5 mg total) by  nebulization every 6 (six) hours as needed for wheezing or shortness of breath. 150 mL 1  . albuterol (VENTOLIN HFA) 108 (90 Base) MCG/ACT inhaler INHALE 2 PUFFS INTO THE LUNGS EVERY 6 HOURS AS NEEDED FOR WHEEZING OR SHORTNESS OF BREATH (Patient taking differently: Inhale 2 puffs into the lungs every 6 (six) hours as needed for wheezing or shortness of breath.) 8.5 g 3  . BEVESPI AEROSPHERE 9-4.8 MCG/ACT AERO INHALE 2 PUFFS INTO THE LUNGS TWICE DAILY (Patient taking differently: Inhale 2 puffs into the lungs in the morning and at bedtime.) 10.7 g 1  . chlorhexidine (PERIDEX) 0.12 % solution Use as directed 15 mLs in the mouth or throat 2 (two) times daily. 120 mL 0  . dexamethasone (DECADRON) 4 MG tablet Take 2 tablets (8 mg total) by mouth daily. Take daily x 3 days starting the day after cisplatin chemotherapy. Take with food. (Patient not taking: Reported on 10/18/2020) 30 tablet 1  . guaiFENesin-dextromethorphan (ROBITUSSIN DM) 100-10 MG/5ML syrup Take 10 mLs by mouth every 4 (four) hours as needed for cough. (Patient not taking: Reported on 10/18/2020) 118 mL 0  . lactose free nutrition (BOOST) LIQD Take 237 mLs by mouth 3 (three) times daily between meals.    . lidocaine (XYLOCAINE) 2 % solution Patient: Mix 1part 2% viscous lidocaine, 1part H20. Swallow 68mL of diluted mixture, 49min before meals and at bedtime, up to QID (Patient taking differently: Use as directed 15 mLs in the mouth or throat in the morning, at noon, in the evening, and at bedtime. Patient: Mix 1part 2% viscous lidocaine, 1part H20. Swallow 62mL of diluted mixture, 30min before meals and at bedtime, up to QID) 200 mL 4  . loperamide (IMODIUM) 2 MG capsule Take 1 capsule (2 mg total) by mouth as needed for diarrhea or loose stools. 30 capsule 0  . Nutritional Supplements (FEEDING SUPPLEMENT, OSMOLITE 1.5 CAL,) LIQD Give 1 1/2 carton of osmolite 1.5 4 times per day (8am, noon, 4pm and 8pm) via feeding tube.  Flush with 29ml of water  before and after feeding.  Drink or give additional 252ml TID of water between feedings. (Patient taking differently: Place 237 mLs into feeding tube See admin instructions. Give 1 1/2 carton of osmolite 1.5 4 times per day (8am, noon, 4pm and 8pm) via feeding tube.  Flush with 62ml of water before and after feeding.  Drink or give additional 241ml TID of water between feedings.)  0  . ondansetron (ZOFRAN) 8 MG tablet Take 1 tablet (8 mg total) by mouth 2 (two) times  daily as needed. Start on the third day after cisplatin chemotherapy. (Patient taking differently: Take 8 mg by mouth 2 (two) times daily as needed for refractory nausea / vomiting. Start on the third day after cisplatin chemotherapy.) 30 tablet 1  . pantoprazole (PROTONIX) 40 MG tablet Take 1 tablet (40 mg total) by mouth daily for 10 days. 10 tablet 0  . prochlorperazine (COMPAZINE) 10 MG tablet Take 1 tablet (10 mg total) by mouth every 6 (six) hours as needed (Nausea or vomiting). (Patient taking differently: Take 10 mg by mouth every 6 (six) hours as needed for nausea or vomiting (Nausea or vomiting).) 30 tablet 1   No current facility-administered medications for this visit.    PHYSICAL EXAMINATION: ECOG PERFORMANCE STATUS: 0 - Asymptomatic  Vitals:   10/18/20 1142  BP: (!) 113/59  Pulse: 92  Resp: 18  Temp: (!) 96.4 F (35.8 C)  SpO2: 99%   Filed Weights   10/18/20 1142  Weight: 121 lb (54.9 kg)    Physical Exam Constitutional:      Appearance: Normal appearance.  HENT:     Head: Normocephalic and atraumatic.     Mouth/Throat:     Pharynx: No oropharyngeal exudate or posterior oropharyngeal erythema.  Eyes:     Extraocular Movements: Extraocular movements intact.     Pupils: Pupils are equal, round, and reactive to light.  Cardiovascular:     Rate and Rhythm: Normal rate and regular rhythm.     Pulses: Normal pulses.     Heart sounds: Normal heart sounds.  Pulmonary:     Effort: Pulmonary effort is normal.      Breath sounds: Normal breath sounds.  Abdominal:     General: Abdomen is flat. There is no distension.     Palpations: Abdomen is soft. There is no mass.     Tenderness: There is no abdominal tenderness.     Comments: PEG site looks clean, some serous discharge, no evidence of infection    Musculoskeletal:        General: No swelling or tenderness. Normal range of motion.     Cervical back: Normal range of motion and neck supple. Tenderness: rad changes, no ulceration.  Lymphadenopathy:     Cervical: No cervical adenopathy (No palpable cervical lymphadenopathy).  Skin:    General: Skin is warm and dry.  Neurological:     General: No focal deficit present.     Mental Status: She is alert.  Psychiatric:        Mood and Affect: Mood normal.       LABORATORY DATA:  I have reviewed the data as listed Lab Results  Component Value Date   WBC 9.6 10/10/2020   HGB 11.6 (L) 10/10/2020   HCT 35.8 (L) 10/10/2020   MCV 98.4 10/10/2020   PLT 134 (L) 10/10/2020     Chemistry      Component Value Date/Time   NA 139 10/10/2020 1545   NA 139 06/02/2020 0845   K 4.5 10/10/2020 1545   CL 103 10/10/2020 1545   CO2 26 10/10/2020 1545   BUN 16 10/10/2020 1545   BUN 12 06/02/2020 0845   CREATININE 0.75 10/10/2020 1545   CREATININE 0.68 09/25/2020 1607   CREATININE 0.70 02/26/2016 1143      Component Value Date/Time   CALCIUM 9.5 10/10/2020 1545   ALKPHOS 39 09/16/2020 0310   AST 23 09/16/2020 0310   ALT 15 09/16/2020 0310   BILITOT 0.6 09/16/2020 0310   BILITOT  0.5 06/02/2020 0845       RADIOGRAPHIC STUDIES: I have personally reviewed the radiological images as listed and agreed with the findings in the report. No results found.  I have reviewed all pertinent images and pathology reports.  Pathology  PATHOLOGY SURGICAL PATHOLOGY  CASE: [id number]  PATIENT: Raven Torres  Surgical Pathology Report      Clinical History: laryngeal cancer (cm)      FINAL  MICROSCOPIC DIAGNOSIS:   A. EPIGLOTTIS, BIOPSY:  - Focal poorly differentiated carcinoma, see comment.   COMMENT:   Most of the fragments have a dense lymphoid infiltrate with germinal  centers. One fragment has poorly differentiated cells at one edge. The  cells are positive for cytokeratin 5/6 and negative for CD20. Thus, the  immunophenotype is consistent with a squamous cell carcinoma. Dr.  Vic Ripper has reviewed the case.    Labs from last week reviewed Cytopenias improved.ordered CMP today  All questions were answered. The patient knows to call the clinic with any problems, questions or concerns. I spent  30 minutes in the care of this patient including H, review of medical records, documentation, counseling and coordination of care.     Benay Pike, MD 10/18/2020 11:56 AM

## 2020-10-19 ENCOUNTER — Ambulatory Visit: Payer: Medicare Other | Attending: Radiation Oncology

## 2020-10-19 ENCOUNTER — Telehealth: Payer: Self-pay | Admitting: Hematology and Oncology

## 2020-10-19 DIAGNOSIS — R1310 Dysphagia, unspecified: Secondary | ICD-10-CM | POA: Insufficient documentation

## 2020-10-19 NOTE — Telephone Encounter (Signed)
Scheduled appt per 3/16 los. Called pt, no answer. Left msg with appt date and time.

## 2020-10-19 NOTE — Therapy (Signed)
[facility] [address] [place], [place], [address] Phone: [phone]   Fax:  [phone]  Speech Language Pathology Treatment  Patient Details  Name: Raven Torres MRN: [id number] Date of Birth: Dec 23, 1955 Referring Provider (SLP): Eppie Gibson, MD   Encounter Date: 10/19/2020   End of Session - 10/19/20 1149    Visit Number 2    Number of Visits 7    Date for SLP Re-Evaluation 11/08/20    SLP Start Time 1114    SLP Stop Time  1146    SLP Time Calculation (min) 32 min    Activity Tolerance Patient tolerated treatment well           Past Medical History:  Diagnosis Date  . Arthritis   . Cataract    removed both eyes   . Colitis   . COPD (chronic obstructive pulmonary disease) ([place])   . Hepatitis C   . Hyperlipidemia   . Hypertension   . Shingles 03/27/2020  . TB (tuberculosis)    treatment     Past Surgical History:  Procedure Laterality Date  . ABDOMINAL HYSTERECTOMY    . CATARACT EXTRACTION, BILATERAL    . COLONOSCOPY    . COLONOSCOPY  04/04/2020  . DIRECT LARYNGOSCOPY N/A 06/26/2020   Procedure: DIRECT LARYNGOSCOPY AND BIOPSY;  Surgeon: Rozetta Nunnery, MD;  Location: [facility];  Service: ENT;  Laterality: N/A;  . IR GASTROSTOMY TUBE MOD SED  07/27/2020  . IR IMAGING GUIDED PORT INSERTION  07/27/2020  . KNEE SURGERY Right   . POLYPECTOMY  11/17/2008   HPP x 1     There were no vitals filed for this visit.   Subjective Assessment - 10/19/20 1118    Subjective "I had pizza last night." Pt is suffering from thckened mucous/saliva.                 ADULT SLP TREATMENT - 10/19/20 1119      General Information   Behavior/Cognition Pleasant mood;Cooperative;Alert      Treatment Provided   Treatment provided Dysphagia      Dysphagia Treatment   Temperature Spikes Noted No    Treatment Methods Therapeutic exercise;Compensation strategy training;Other (comment);Skilled  observation    Patient observed directly with PO's Yes    Type of PO's observed Dysphagia 3 (soft);Thin liquids    Liquids provided via --   bottle   Oral Phase Signs & Symptoms Other (comment)   none noted   Pharyngeal Phase Signs & Symptoms --   nothing noted   Other treatment/comments Sjhe reports she has "most" of her taste back. Pt does have cough throughout session today, but lung sounds yesterday with medical oncology appointment wre WNL. She reports she is not completing HEP with scope and frequency as prescribed by SLP. SLP reminded pt of benefits of BID completion  "Oh I didn't do that one" pt stated for pitch raise, Masako, supraglottic, and Mendelsohn. Pt req'd max A for these exercises, faded to supervision cues for each. SLP had to tell pt rationale for HEP, but she pt was indpendent with rationale by session end, and could tell SLP importance of BID completion.      Assessment / Recommendations / Plan   Plan Continue with current plan of care      Dysphagia Recommendations   Diet recommendations --   as tolerated   Liquids provided via --   bottle   Medication Administration --   as tolerated  SLP Education - 10/19/20 1148    Education Details late effects head/neck radiation on swallowing, HEP procedure, need to do HEP BID and ALL exercises    Person(s) Educated Patient    Methods Explanation;Demonstration;Verbal cues    Comprehension Verbalized understanding;Returned demonstration;Verbal cues required;Need further instruction            SLP Short Term Goals - 10/19/20 1151      SLP SHORT TERM GOAL #1   Title pt will complete HEP with rare min A  in 2 sessions    Time 1    Period --   sessions, for all STGs   Status On-going      SLP SHORT TERM GOAL #2   Title pt will tell SLP why pt is completing HEP with rare min A, then be able to repeat to SLP after 15 minutes with modified independence    Time 1    Status On-going      SLP SHORT TERM GOAL #3    Title pt will describe 3 overt s/s aspiration PNA with modified independence    Time 1    Status On-going      SLP SHORT TERM GOAL #4   Title pt will tell SLP how a food journal could hasten return to a more normalized diet    Time 2    Status On-going            SLP Long Term Goals - 10/19/20 1152      SLP LONG TERM GOAL #1   Title pt will complete HEP with modified independence over two visits    Time 4    Period --   visits, for all LTGs   Status On-going      SLP LONG TERM GOAL #2   Title pt will describe how to modify HEP over time, and the timeline associated with reduction in HEP frequency using written cues    Time 5    Status On-going            Plan - 10/19/20 1150    Clinical Impression Statement At this time pt swallowing is deemed WNL/WFL with dys III items and thin liquids.SLP reviewed pt's individualized HEP for dysphagia and pt completed each exercise with mod-max cues faded to modified independent. There are no overt s/s aspiration reported by pt at this time; pt's lung sounds were WNL yesterday at medical oncology checkup. Data indicate that pt's swallow ability could very well decline over time following conclusion of their radiation therapy due to muscle disuse atrophy and/or muscle fibrosis. Raven Torres will cont to need to be seen by SLP in order to assess safety of PO intake, assess the need for recommending any objective swallow assessment, and ensuring pt correctly completes the individualized HEP.    Speech Therapy Frequency --   once every approx 4 weeks   Duration --   7 total visits   Treatment/Interventions Aspiration precaution training;Pharyngeal strengthening exercises;Diet toleration management by SLP;Trials of upgraded texture/liquids;Patient/family education;SLP instruction and feedback;Compensatory strategies    Potential to Achieve Goals Good    Potential Considerations Ability to learn/carryover information   pt reports she has memory  difficulties   SLP Home Exercise Plan provided today    Consulted and Agree with Plan of Care Patient           Patient will benefit from skilled therapeutic intervention in order to improve the following deficits and impairments:   Dysphagia, unspecified type  Problem List Patient Active Problem List   Diagnosis Date Noted  . Protein-calorie malnutrition, severe 09/13/2020  . Hypotension 09/12/2020  . Tobacco dependence 08/11/2020  . Malignant neoplasm of supraglottis ([place]) 07/13/2020  . Herpes zoster without complication 99/37/1696  . GOLD COPD III B 04/23/2018  . Pruritic dermatitis 04/23/2018  . High cholesterol 06/24/2017  . Shortness of breath 06/04/2017  . Coronary artery calcification 06/04/2017  . Other fatigue 06/04/2017  . Centrilobular emphysema ([place]) 05/15/2017  . Atherosclerosis of arteries 05/15/2017  . Poor memory 05/15/2017  . TB lung, latent 04/16/2017  . Lung nodules 04/16/2017  . Diabetes mellitus screening 11/19/2016  . Fibromyalgia 05/15/2015  . Rheumatoid arthritis ([place]) 08/22/2014  . HTN (hypertension) 08/22/2014  . Tobacco abuse 08/22/2014  . Hepatic cirrhosis ([place]) 05/31/2014  . Chronic hepatitis C virus infection ([place]) 05/03/2014    [place] ,[place], [place]  10/19/2020, 11:52 AM  [facility] [address] [place], [place], [address] Phone: [phone]   Fax:  [phone]   Name: Raven Torres MRN: [id number] Date of Birth: January 17, 1956

## 2020-10-23 DIAGNOSIS — J441 Chronic obstructive pulmonary disease with (acute) exacerbation: Secondary | ICD-10-CM | POA: Diagnosis not present

## 2020-11-08 ENCOUNTER — Ambulatory Visit (INDEPENDENT_AMBULATORY_CARE_PROVIDER_SITE_OTHER): Payer: Medicare Other | Admitting: Otolaryngology

## 2020-11-22 NOTE — Progress Notes (Signed)
  Patient Name: Raven Torres MRN: [id number] DOB: [birth year] Referring Physician: Melony Overly (Profile Not Attached) Date of Service: 09/25/2020 [facility], [place]                                                        End Of Treatment Note  Diagnoses: C32.1-Malignant neoplasm of supraglottis  Cancer Staging: Cancer Staging Malignant neoplasm of supraglottis [facility]) Staging form: Larynx - Supraglottis, AJCC 8th Edition - Clinical stage from 07/10/2020: Stage IVA (cT3, cN2a, cM0) - Signed by Eppie Gibson, MD on 07/14/2020 Stage prefix: Initial diagnosis  Intent: Curative  Radiation Treatment Dates: 07/24/2020 through 09/25/2020 Site Technique Total Dose (Gy) Dose per Fx (Gy) Completed Fx Beam Energies  Neck: HN_supragl IMRT 70/70 2 35/35 6X   Narrative: The patient completed radiation therapy despite getting admitted to the hospital and having covid during part of her treatment regimen.  She did not use her feeding tube as recommended and struggled with nutrition, weight loss. The team supported her with extra attention to help her complete radiotherapy, but she stopped chemotherapy prematurely.  Plan: The patient will follow-up with radiation oncology in 2-3wks . -----------------------------------  Eppie Gibson, MD

## 2020-11-23 ENCOUNTER — Other Ambulatory Visit: Payer: Self-pay

## 2020-11-23 ENCOUNTER — Inpatient Hospital Stay: Payer: Medicare Other | Attending: Hematology and Oncology | Admitting: Nutrition

## 2020-11-23 ENCOUNTER — Ambulatory Visit: Payer: Medicare Other | Attending: Radiation Oncology

## 2020-11-23 DIAGNOSIS — R1310 Dysphagia, unspecified: Secondary | ICD-10-CM | POA: Insufficient documentation

## 2020-11-23 DIAGNOSIS — J441 Chronic obstructive pulmonary disease with (acute) exacerbation: Secondary | ICD-10-CM | POA: Diagnosis not present

## 2020-11-23 NOTE — Therapy (Signed)
[facility] [address] [place], [place], [address] Phone: [phone]   Fax:  [phone]  Speech Language Pathology Treatment  Patient Details  Name: Raven Torres MRN: [id number] Date of Birth: July 06, 1956 Referring Provider (SLP): Eppie Gibson, MD   Encounter Date: 11/23/2020   End of Session - 11/23/20 1347    Visit Number 3    Number of Visits 7    Date for SLP Re-Evaluation 11/08/20    SLP Start Time 77    SLP Stop Time  7654    SLP Time Calculation (min) 31 min    Activity Tolerance Patient tolerated treatment well           Past Medical History:  Diagnosis Date  . Arthritis   . Cataract    removed both eyes   . Colitis   . COPD (chronic obstructive pulmonary disease) ([facility])   . Hepatitis C   . Hyperlipidemia   . Hypertension   . Shingles 03/27/2020  . TB (tuberculosis)    treatment     Past Surgical History:  Procedure Laterality Date  . ABDOMINAL HYSTERECTOMY    . CATARACT EXTRACTION, BILATERAL    . COLONOSCOPY    . COLONOSCOPY  04/04/2020  . DIRECT LARYNGOSCOPY N/A 06/26/2020   Procedure: DIRECT LARYNGOSCOPY AND BIOPSY;  Surgeon: Rozetta Nunnery, MD;  Location: [facility];  Service: ENT;  Laterality: N/A;  . IR GASTROSTOMY TUBE MOD SED  07/27/2020  . IR IMAGING GUIDED PORT INSERTION  07/27/2020  . KNEE SURGERY Right   . POLYPECTOMY  11/17/2008   HPP x 1     There were no vitals filed for this visit.   Subjective Assessment - 11/23/20 1318    Subjective "I have so much phlegm - thick, thick phlegm." "I'm eating anything I want now."    Currently in Pain? No/denies                 ADULT SLP TREATMENT - 11/23/20 1322      General Information   Behavior/Cognition Pleasant mood;Cooperative;Alert      Dysphagia Treatment   Temperature Spikes Noted No    Treatment Methods Therapeutic exercise;Compensation strategy training;Other (comment);Skilled observation     Patient observed directly with PO's Yes    Type of PO's observed Dysphagia 3 (soft);Thin liquids    Liquids provided via --   bottle   Oral Phase Signs & Symptoms Other (comment)   not noted   Pharyngeal Phase Signs & Symptoms Other (comment)   not noted   Other treatment/comments "Some days I talk better and some days it's really difficult to get the voice." Pt reports so much thick phlegm makes her cough so much - and her voice quality decreases after this. Today, pt's voice appears mod hoarse. Pt states she eats anything she wants - just careful to chew things up well. Pt told SLP she is not completing HEP as directed - SLP reiterated that scope and frequency of HEP is necessary to give her best opportunity for maintaining WNL swallowing. She req'd min-mod A occasionally, faded to modified independent by session end for accuracy with HEP procedure.      Assessment / Recommendations / Plan   Plan Continue with current plan of care      Progression Toward Goals   Progression toward goals Progressing toward goals            SLP Education - 11/23/20 1647    Education Details  late effects head/neck radiaion on swallowing function, scope and freqency of HEP is necessary for best outcomes, HEP procedure    Person(s) Educated Patient    Methods Explanation;Demonstration;Verbal cues    Comprehension Verbalized understanding;Returned demonstration;Verbal cues required;Need further instruction            SLP Short Term Goals - 11/23/20 1355      SLP SHORT TERM GOAL #1   Title pt will complete HEP with rare min A  in 2 sessions    Period --   sessions, for all STGs   Status Not Met      SLP SHORT TERM GOAL #2   Title pt will tell SLP why pt is completing HEP with rare min A, then be able to repeat to SLP after 15 minutes with modified independence    Status Achieved      SLP SHORT TERM GOAL #3   Title pt will describe 3 overt s/s aspiration PNA with modified independence    Status  Deferred   added to [place] #4   Title pt will tell SLP how a food journal could hasten return to a more normalized diet    Status Deferred   pt eating satisfactory diet currently without using food journal           SLP Long Term Goals - 11/23/20 1357      SLP LONG TERM GOAL #1   Title pt will complete HEP with modified independence over two visits    Time 3    Period --   visits, for all LTGs   Status On-going      SLP LONG TERM GOAL #2   Title pt will describe how to modify HEP over time, and the timeline associated with reduction in HEP frequency using written cues    Time 4    Status On-going      SLP LONG TERM GOAL #3   Title pt will tell SLP 3 overt s/sx of aspiration PNA with modified independent    Time 4    Period Weeks    Status New            Plan - 11/23/20 1353    Clinical Impression Statement At this time pt swallowing is deemed WNL/WFL with dys III items and thin liquids. SLP reviewed pt's individualized HEP - providing min A occasionally, faded to modified independent. Pt has again suboptimally completed frequency and scope of HEP.  There are no overt s/s aspiration reported by pt at this time. Data indicate that pt's swallow ability could very well decline over time following conclusion of their radiation therapy due to muscle disuse atrophy and/or muscle fibrosis. Ms. Novack will cont to need to be seen by SLP in order to assess safety of PO intake, assess the need for recommending any objective swallow assessment, and ensuring pt correctly completes the individualized HEP. If she continues with suboptimal frequency and scope of HEP but swallowing looks WNL/WFL next time and no overt s/sx aspiration PNA she may be d/c'd from [facility] next session.    Speech Therapy Frequency --   once every approx 4 weeks   Duration --   7 total visits   Treatment/Interventions Aspiration precaution training;Pharyngeal strengthening exercises;Diet toleration  management by SLP;Trials of upgraded texture/liquids;Patient/family education;SLP instruction and feedback;Compensatory strategies    Potential to Achieve Goals Good    Potential Considerations Ability to learn/carryover information   pt reports she  has memory difficulties   SLP Home Exercise Plan provided today    Consulted and Agree with Plan of Care Patient           Patient will benefit from skilled therapeutic intervention in order to improve the following deficits and impairments:   Dysphagia, unspecified type    Problem List Patient Active Problem List   Diagnosis Date Noted  . Protein-calorie malnutrition, severe 09/13/2020  . Hypotension 09/12/2020  . Tobacco dependence 08/11/2020  . Malignant neoplasm of supraglottis ([facility]) 07/13/2020  . Herpes zoster without complication 32/44/0102  . GOLD COPD III B 04/23/2018  . Pruritic dermatitis 04/23/2018  . High cholesterol 06/24/2017  . Shortness of breath 06/04/2017  . Coronary artery calcification 06/04/2017  . Other fatigue 06/04/2017  . Centrilobular emphysema ([facility]) 05/15/2017  . Atherosclerosis of arteries 05/15/2017  . Poor memory 05/15/2017  . TB lung, latent 04/16/2017  . Lung nodules 04/16/2017  . Diabetes mellitus screening 11/19/2016  . Fibromyalgia 05/15/2015  . Rheumatoid arthritis ([facility]) 08/22/2014  . HTN (hypertension) 08/22/2014  . Tobacco abuse 08/22/2014  . Hepatic cirrhosis ([facility]) 05/31/2014  . Chronic hepatitis C virus infection ([facility]) 05/03/2014    [place] ,[place], [place]  11/23/2020, 4:50 PM  [facility] [address] [place], [place], [address] Phone: [phone]   Fax:  [phone]   Name: XANA BRADT MRN: [id number] Date of Birth: 1956/03/28

## 2020-11-23 NOTE — Progress Notes (Signed)
Nutrition follow-up completed with patient status post treatment for laryngeal cancer.  Patient reports she hates her feeding tube.  Reports it smells.  States she had nursing look at it today and they indicated there was no problem.  She knows that she needs to gain weight before her feeding tube will be removed.  Reports using 3 cartons of Ensure Plus or boost plus via tube.  Reports she does not tolerate Osmolite 1.5.  She eats whatever she likes and has been enjoying salads and vegetables.  She does not tolerate dairy foods as she says they increase her saliva.    Nutrition diagnosis: Inadequate oral energy intake continues.  Intervention: Encourage patient to continue 3 cartons of Ensure Plus or equivalent 3 times daily.  Provided 1 complementary case and coupons. Reviewed high-calorie high-protein foods. Recommended patient consume small amounts of food 6 times a day. Encouraged her to consume proteins and carbohydrates before salad.  Monitoring, evaluation, goals: Patient will tolerate increased oral intake/tube feeding to minimize weight loss and promote stabilization/weight gain.  Next visit: To be scheduled in approximately 4 weeks.  **Disclaimer: This note was dictated with voice recognition software. Similar sounding words can inadvertently be transcribed and this note may contain transcription errors which may not have been corrected upon publication of note.**

## 2020-11-28 ENCOUNTER — Other Ambulatory Visit: Payer: Self-pay

## 2020-11-28 ENCOUNTER — Ambulatory Visit (INDEPENDENT_AMBULATORY_CARE_PROVIDER_SITE_OTHER): Payer: Medicare Other | Admitting: Otolaryngology

## 2020-11-28 ENCOUNTER — Encounter (INDEPENDENT_AMBULATORY_CARE_PROVIDER_SITE_OTHER): Payer: Self-pay | Admitting: Otolaryngology

## 2020-11-28 VITALS — Temp 97.3°F

## 2020-11-28 DIAGNOSIS — Z8521 Personal history of malignant neoplasm of larynx: Secondary | ICD-10-CM | POA: Diagnosis not present

## 2020-11-28 NOTE — Progress Notes (Signed)
HPI: Raven Torres is a 65 y.o. 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  This was diagnosed in December of last year.  She is just recently completed chemoradiation treatment a couple months ago.  She still has difficulty maintaining her weight and still has a G-tube in place.  She is coughing mucus frequently.  She is scheduled to see Dr. Isidore Moos next month..  She has not yet had her post treatment PET scan performed.  Past Medical History:  Diagnosis Date  . Arthritis   . Cataract    removed both eyes   . Colitis   . COPD (chronic obstructive pulmonary disease) ([facility])   . Hepatitis C   . Hyperlipidemia   . Hypertension   . Shingles 03/27/2020  . TB (tuberculosis)    treatment    Past Surgical History:  Procedure Laterality Date  . ABDOMINAL HYSTERECTOMY    . CATARACT EXTRACTION, BILATERAL    . COLONOSCOPY    . COLONOSCOPY  04/04/2020  . DIRECT LARYNGOSCOPY N/A 06/26/2020   Procedure: DIRECT LARYNGOSCOPY AND BIOPSY;  Surgeon: Rozetta Nunnery, MD;  Location: [facility];  Service: ENT;  Laterality: N/A;  . IR GASTROSTOMY TUBE MOD SED  07/27/2020  . IR IMAGING GUIDED PORT INSERTION  07/27/2020  . KNEE SURGERY Right   . POLYPECTOMY  11/17/2008   HPP x 1    Social History   Socioeconomic History  . Marital status: Married    Spouse name: Not on file  . Number of children: Not on file  . Years of education: Not on file  . Highest education level: Not on file  Occupational History  . Not on file  Tobacco Use  . Smoking status: Current Every Day Smoker    Packs/day: 1.00    Years: 51.00    Pack years: 51.00    Types: Cigarettes    Start date: 64  . Smokeless tobacco: Never Used  . Tobacco comment: 4 cigs a day   Vaping Use  . Vaping Use: Never used  Substance and Sexual Activity  . Alcohol use: No    Alcohol/week: 0.0 standard drinks  . Drug use: No  . Sexual activity: Not Currently  Other Topics Concern  .  Not on file  Social History Narrative  . Not on file   Social Determinants of Health   Financial Resource Strain: Not on file  Food Insecurity: No Food Insecurity  . Worried About Charity fundraiser in the Last Year: Never true  . Ran Out of Food in the Last Year: Never true  Transportation Needs: No Transportation Needs  . Lack of Transportation (Medical): No  . Lack of Transportation (Non-Medical): No  Physical Activity: Not on file  Stress: Not on file  Social Connections: Not on file   Family History  Problem Relation Age of Onset  . Heart disease Mother   . Cancer Mother   . Melanoma Mother   . Esophageal cancer Father   . Colon cancer Neg Hx   . Colon polyps Neg Hx   . Rectal cancer Neg Hx   . Stomach cancer Neg Hx    Allergies  Allergen Reactions  . Tudorza Pressair [Aclidinium Bromide] Itching  . Amitriptyline Other (See Comments)    Eye spasms per pt  . Codeine Nausea Only  . Tramadol Nausea Only   Prior to Admission medications   Medication Sig Start Date End Date Taking? Authorizing Provider  albuterol (PROVENTIL) (2.5 MG/3ML) 0.083% nebulizer solution Take 3 mLs (2.5 mg total) by nebulization every 6 (six) hours as needed for wheezing or shortness of breath. 05/11/20   Ladell Pier, MD  albuterol (VENTOLIN HFA) 108 (90 Base) MCG/ACT inhaler INHALE 2 PUFFS INTO THE LUNGS EVERY 6 HOURS AS NEEDED FOR WHEEZING OR SHORTNESS OF BREATH Patient taking differently: Inhale 2 puffs into the lungs every 6 (six) hours as needed for wheezing or shortness of breath. 03/29/20   Magdalen Spatz, NP  BEVESPI AEROSPHERE 9-4.8 MCG/ACT AERO INHALE 2 PUFFS INTO THE LUNGS TWICE DAILY Patient taking differently: Inhale 2 puffs into the lungs in the morning and at bedtime. 04/11/20   Mannam, Hart Robinsons, MD  chlorhexidine (PERIDEX) 0.12 % solution Use as directed 15 mLs in the mouth or throat 2 (two) times daily. 09/16/20   Pokhrel, Corrie Mckusick, MD  dexamethasone (DECADRON) 4 MG tablet Take 2  tablets (8 mg total) by mouth daily. Take daily x 3 days starting the day after cisplatin chemotherapy. Take with food. Patient not taking: Reported on 10/18/2020 07/13/20   Benay Pike, MD  guaiFENesin-dextromethorphan (ROBITUSSIN DM) 100-10 MG/5ML syrup Take 10 mLs by mouth every 4 (four) hours as needed for cough. Patient not taking: Reported on 10/18/2020 09/16/20   Flora Lipps, MD  lactose free nutrition (BOOST) LIQD Take 237 mLs by mouth 3 (three) times daily between meals.    [provider]  lidocaine (XYLOCAINE) 2 % solution Patient: Mix 1part 2% viscous lidocaine, 1part H20. Swallow 44mL of diluted mixture, 69min before meals and at bedtime, up to QID Patient taking differently: Use as directed 15 mLs in the mouth or throat in the morning, at noon, in the evening, and at bedtime. Patient: Mix 1part 2% viscous lidocaine, 1part H20. Swallow 68mL of diluted mixture, 24min before meals and at bedtime, up to QID 07/24/20   Eppie Gibson, MD  loperamide (IMODIUM) 2 MG capsule Take 1 capsule (2 mg total) by mouth as needed for diarrhea or loose stools. 09/16/20   Pokhrel, Corrie Mckusick, MD  Nutritional Supplements (FEEDING SUPPLEMENT, OSMOLITE 1.5 CAL,) LIQD Give 1 1/2 carton of osmolite 1.5 4 times per day (8am, noon, 4pm and 8pm) via feeding tube.  Flush with 52ml of water before and after feeding.  Drink or give additional 239ml TID of water between feedings. Patient taking differently: Place 237 mLs into feeding tube See admin instructions. Give 1 1/2 carton of osmolite 1.5 4 times per day (8am, noon, 4pm and 8pm) via feeding tube.  Flush with 74ml of water before and after feeding.  Drink or give additional 289ml TID of water between feedings. 08/23/20   Eppie Gibson, MD  ondansetron (ZOFRAN) 8 MG tablet Take 1 tablet (8 mg total) by mouth 2 (two) times daily as needed. Start on the third day after cisplatin chemotherapy. Patient taking differently: Take 8 mg by mouth 2 (two) times daily as  needed for refractory nausea / vomiting. Start on the third day after cisplatin chemotherapy. 07/13/20   Benay Pike, MD  pantoprazole (PROTONIX) 40 MG tablet Take 1 tablet (40 mg total) by mouth daily for 10 days. 09/16/20 09/26/20  Pokhrel, Corrie Mckusick, MD  prochlorperazine (COMPAZINE) 10 MG tablet Take 1 tablet (10 mg total) by mouth every 6 (six) hours as needed (Nausea or vomiting). Patient taking differently: Take 10 mg by mouth every 6 (six) hours as needed for nausea or vomiting (Nausea or vomiting). 07/13/20   Benay Pike, MD  Positive ROS: Otherwise negative  All other systems have been reviewed and were otherwise negative with the exception of those mentioned in the HPI and as above.  Physical Exam: Constitutional: Alert, well-appearing, no acute distress Ears: External ears without lesions or tenderness. Ear canals are clear bilaterally with intact, clear TMs.  Nasal: External nose without lesions. Septum slightly deviated to the right.. Clear nasal passages otherwise. Oral: Lips and gums without lesions. Tongue and palate mucosa without lesions. Posterior oropharynx clear.  Indirect laryngoscopy revealed a clear base of tongue vallecula and tip of epiglottis. Fiberoptic laryngoscopy was performed through the left nostril.  The nasopharynx was clear.  On evaluation of the laryngeal surface of the epiglottis she had a moderate amount of crusting scabbing and supraglottic mucus.  No gross disease visualized.  Vocal cords were clear with normal vocal mobility bilaterally.  Moderate edema.  No obvious mucosal lesions noted but she had a fair amount of supraglottic crusting and mucus. Neck: No palpable adenopathy or masses.  No significant palpable adenopathy noted on either side of the neck.  No supraglottic adenopathy noted. Respiratory: Breathing comfortably  Skin: No facial/neck lesions or rash noted.  Laryngoscopy  Date/Time: 11/28/2020 5:10 PM Performed by: Rozetta Nunnery, MD Authorized by: Rozetta Nunnery, MD   Consent:    Consent obtained:  Verbal   Consent given by:  Patient Procedure details:    Indications: oncologic surveillance follow-up     Medication:  Afrin   Instrument: flexible fiberoptic laryngoscope     Scope location: left nare   Sinus:    Left nasopharynx: normal   Mouth:    Oropharynx: normal     Vallecula: normal     Base of tongue: normal     Epiglottis: normal   Throat:    True vocal cords: normal   Comments:     On fiberoptic laryngoscopy patient had a fair amount of scabbing and crusting of the laryngeal surface of epiglottis as well as excessive mucus and mild edema.  No obvious persistent cancer or ulcerations noted.  Both vocal cords were edematous but had normal vocal mobility.    Assessment: History of T3N2 supraglottic or epiglottic cancer status post chemoradiation treatment.  Still with G-tube in place. No obvious disease noted on fiberoptic laryngoscopy in the office today.  Posttreatment PET scan still pending.  Plan: She will follow-up in 4 to 5 months for recheck.     Radene Journey, MD

## 2020-12-11 ENCOUNTER — Other Ambulatory Visit: Payer: Self-pay

## 2020-12-11 ENCOUNTER — Ambulatory Visit: Payer: Medicare Other | Attending: Internal Medicine | Admitting: Internal Medicine

## 2020-12-11 ENCOUNTER — Encounter: Payer: Self-pay | Admitting: Internal Medicine

## 2020-12-11 VITALS — BP 126/63 | HR 84 | Ht 66.0 in | Wt 116.0 lb

## 2020-12-11 DIAGNOSIS — I2583 Coronary atherosclerosis due to lipid rich plaque: Secondary | ICD-10-CM | POA: Diagnosis not present

## 2020-12-11 DIAGNOSIS — B182 Chronic viral hepatitis C: Secondary | ICD-10-CM

## 2020-12-11 DIAGNOSIS — E43 Unspecified severe protein-calorie malnutrition: Secondary | ICD-10-CM

## 2020-12-11 DIAGNOSIS — H9191 Unspecified hearing loss, right ear: Secondary | ICD-10-CM | POA: Diagnosis not present

## 2020-12-11 DIAGNOSIS — F17200 Nicotine dependence, unspecified, uncomplicated: Secondary | ICD-10-CM

## 2020-12-11 DIAGNOSIS — I2510 Atherosclerotic heart disease of native coronary artery without angina pectoris: Secondary | ICD-10-CM | POA: Diagnosis not present

## 2020-12-11 DIAGNOSIS — R634 Abnormal weight loss: Secondary | ICD-10-CM | POA: Diagnosis not present

## 2020-12-11 DIAGNOSIS — J432 Centrilobular emphysema: Secondary | ICD-10-CM

## 2020-12-11 DIAGNOSIS — I10 Essential (primary) hypertension: Secondary | ICD-10-CM | POA: Diagnosis not present

## 2020-12-11 NOTE — Patient Instructions (Addendum)
I would recommend increasing her Boost/Ensure feedings to 4 cans a day. I have given you a prescription to see whether your insurance would pay for Ensure for you.  Consider crushing your cholesterol medicine the atorvastatin and putting it through your feeding tube.

## 2020-12-11 NOTE — Progress Notes (Signed)
Patient ID: Raven Torres, female    DOB: 1955-10-06  MRN: [id number]  CC: Follow-up   Subjective: Raven Torres is a 65 y.o. female who presents for chronic disease management. Her concerns today include:  Patient with history of supraglottic laryngeal CA,HTN, cured hepatitis C with cirrhosis, adjustment disorder, HL, tobacco dependence, and rheumatoid arthritis/fibromyalgia(Dr. Posey Pronto at [facility]) , memory decline(MMSE 05/2019 was 30/30), COPD GOLD III, lung nodules (Dr. Vaughan Browner), coronary atherosclerosis.  Laryngeal CA: completed chemoXRT. Reports being told she is in remission.  "I'm dealing with the aftermath.." Having a lot of dry mouth. Using [facility] which helps Taking a little more food via mouth. Food has no taste.  Also gives herself 2-3 cans/day or Ensure or Boost through her feeding tube.  Can not tolerate Osmolite.  Causes diarrhea. Also does water flushes.  -no device to check BP but it has been okay off meds -mentally she gets down sometimes due to her overall health and cancer dx.  Good family support  COPD: a lot of phlegm/cough in the mornings. Bevespi and Albuterol BID Still smoking 2-3/day which is less.   RA:  "I always hurt from the arthritis." Back is a little worse.  Now that she is off chemotherapy, she will be following up with her rheumatologist fairly soon to see whether he would recommend getting back on DMARD or something different.  Hep C: Due for ultrasound for [place] screening  HL/Coroary atheroscl: she has held off on restarting statin therapy because she was having problems swallowing it. She has also held off on taking ASA.  She has had some low PLT with chem Patient Active Problem List   Diagnosis Date Noted  . Protein-calorie malnutrition, severe 09/13/2020  . Hypotension 09/12/2020  . Tobacco dependence 08/11/2020  . Malignant neoplasm of supraglottis ([place]) 07/13/2020  . Herpes zoster without complication 70/35/0093  . GOLD COPD III B  04/23/2018  . Pruritic dermatitis 04/23/2018  . High cholesterol 06/24/2017  . Shortness of breath 06/04/2017  . Coronary artery calcification 06/04/2017  . Other fatigue 06/04/2017  . Centrilobular emphysema ([place]) 05/15/2017  . Atherosclerosis of arteries 05/15/2017  . Poor memory 05/15/2017  . TB lung, latent 04/16/2017  . Lung nodules 04/16/2017  . Diabetes mellitus screening 11/19/2016  . Fibromyalgia 05/15/2015  . Rheumatoid arthritis ([place]) 08/22/2014  . HTN (hypertension) 08/22/2014  . Tobacco abuse 08/22/2014  . Hepatic cirrhosis ([place]) 05/31/2014  . Chronic hepatitis C virus infection ([place]) 05/03/2014     Current Outpatient Medications on File Prior to Visit  Medication Sig Dispense Refill  . albuterol (PROVENTIL) (2.5 MG/3ML) 0.083% nebulizer solution Take 3 mLs (2.5 mg total) by nebulization every 6 (six) hours as needed for wheezing or shortness of breath. 150 mL 1  . albuterol (VENTOLIN HFA) 108 (90 Base) MCG/ACT inhaler INHALE 2 PUFFS INTO THE LUNGS EVERY 6 HOURS AS NEEDED FOR WHEEZING OR SHORTNESS OF BREATH (Patient taking differently: Inhale 2 puffs into the lungs every 6 (six) hours as needed for wheezing or shortness of breath.) 8.5 g 3  . BEVESPI AEROSPHERE 9-4.8 MCG/ACT AERO INHALE 2 PUFFS INTO THE LUNGS TWICE DAILY (Patient taking differently: Inhale 2 puffs into the lungs in the morning and at bedtime.) 10.7 g 1  . chlorhexidine (PERIDEX) 0.12 % solution Use as directed 15 mLs in the mouth or throat 2 (two) times daily. 120 mL 0  . dexamethasone (DECADRON) 4 MG tablet Take 2 tablets (8 mg total) by mouth daily. Take daily  x 3 days starting the day after cisplatin chemotherapy. Take with food. (Patient not taking: Reported on 10/18/2020) 30 tablet 1  . guaiFENesin-dextromethorphan (ROBITUSSIN DM) 100-10 MG/5ML syrup Take 10 mLs by mouth every 4 (four) hours as needed for cough. (Patient not taking: Reported on 10/18/2020) 118 mL 0  . lactose free nutrition (BOOST) LIQD  Take 237 mLs by mouth 3 (three) times daily between meals.    . lidocaine (XYLOCAINE) 2 % solution Patient: Mix 1part 2% viscous lidocaine, 1part H20. Swallow 90mL of diluted mixture, 57min before meals and at bedtime, up to QID (Patient taking differently: Use as directed 15 mLs in the mouth or throat in the morning, at noon, in the evening, and at bedtime. Patient: Mix 1part 2% viscous lidocaine, 1part H20. Swallow 26mL of diluted mixture, 49min before meals and at bedtime, up to QID) 200 mL 4  . loperamide (IMODIUM) 2 MG capsule Take 1 capsule (2 mg total) by mouth as needed for diarrhea or loose stools. 30 capsule 0  . Nutritional Supplements (FEEDING SUPPLEMENT, OSMOLITE 1.5 CAL,) LIQD Give 1 1/2 carton of osmolite 1.5 4 times per day (8am, noon, 4pm and 8pm) via feeding tube.  Flush with 96ml of water before and after feeding.  Drink or give additional 225ml TID of water between feedings. (Patient taking differently: Place 237 mLs into feeding tube See admin instructions. Give 1 1/2 carton of osmolite 1.5 4 times per day (8am, noon, 4pm and 8pm) via feeding tube.  Flush with 81ml of water before and after feeding.  Drink or give additional 234ml TID of water between feedings.)  0  . ondansetron (ZOFRAN) 8 MG tablet Take 1 tablet (8 mg total) by mouth 2 (two) times daily as needed. Start on the third day after cisplatin chemotherapy. (Patient taking differently: Take 8 mg by mouth 2 (two) times daily as needed for refractory nausea / vomiting. Start on the third day after cisplatin chemotherapy.) 30 tablet 1  . pantoprazole (PROTONIX) 40 MG tablet Take 1 tablet (40 mg total) by mouth daily for 10 days. 10 tablet 0  . prochlorperazine (COMPAZINE) 10 MG tablet Take 1 tablet (10 mg total) by mouth every 6 (six) hours as needed (Nausea or vomiting). (Patient taking differently: Take 10 mg by mouth every 6 (six) hours as needed for nausea or vomiting (Nausea or vomiting).) 30 tablet 1   No current  facility-administered medications on file prior to visit.    Allergies  Allergen Reactions  . Tudorza Pressair [Aclidinium Bromide] Itching  . Amitriptyline Other (See Comments)    Eye spasms per pt  . Codeine Nausea Only  . Tramadol Nausea Only    Social History   Socioeconomic History  . Marital status: Married    Spouse name: Not on file  . Number of children: Not on file  . Years of education: Not on file  . Highest education level: Not on file  Occupational History  . Not on file  Tobacco Use  . Smoking status: Current Every Day Smoker    Packs/day: 1.00    Years: 51.00    Pack years: 51.00    Types: Cigarettes    Start date: 17  . Smokeless tobacco: Never Used  . Tobacco comment: 4 cigs a day   Vaping Use  . Vaping Use: Never used  Substance and Sexual Activity  . Alcohol use: No    Alcohol/week: 0.0 standard drinks  . Drug use: No  . Sexual activity: Not  Currently  Other Topics Concern  . Not on file  Social History Narrative  . Not on file   Social Determinants of Health   Financial Resource Strain: Not on file  Food Insecurity: No Food Insecurity  . Worried About Charity fundraiser in the Last Year: Never true  . Ran Out of Food in the Last Year: Never true  Transportation Needs: No Transportation Needs  . Lack of Transportation (Medical): No  . Lack of Transportation (Non-Medical): No  Physical Activity: Not on file  Stress: Not on file  Social Connections: Not on file  Intimate Partner Violence: Not on file    Family History  Problem Relation Age of Onset  . Heart disease Mother   . Cancer Mother   . Melanoma Mother   . Esophageal cancer Father   . Colon cancer Neg Hx   . Colon polyps Neg Hx   . Rectal cancer Neg Hx   . Stomach cancer Neg Hx     Past Surgical History:  Procedure Laterality Date  . ABDOMINAL HYSTERECTOMY    . CATARACT EXTRACTION, BILATERAL    . COLONOSCOPY    . COLONOSCOPY  04/04/2020  . DIRECT LARYNGOSCOPY N/A  06/26/2020   Procedure: DIRECT LARYNGOSCOPY AND BIOPSY;  Surgeon: Rozetta Nunnery, MD;  Location: [facility];  Service: ENT;  Laterality: N/A;  . IR GASTROSTOMY TUBE MOD SED  07/27/2020  . IR IMAGING GUIDED PORT INSERTION  07/27/2020  . KNEE SURGERY Right   . POLYPECTOMY  11/17/2008   HPP x 1     ROS: Review of Systems Ears: After visit was over patient told my CMA that she was having some issue with decreased hearing in the right ear and wanted referral to ENT.  PHYSICAL EXAM: BP 126/63   Pulse 84   Ht 5\' 6"  (1.676 m)   Wt 116 lb (52.6 kg)   SpO2 96%   BMI 18.72 kg/m   Wt Readings from Last 3 Encounters:  12/11/20 116 lb (52.6 kg)  11/23/20 117 lb (53.1 kg)  10/18/20 121 lb (54.9 kg)     Physical Exam  General appearance -pleasant elderly Caucasian female who appears chronically ill with significant weight loss and noted hair loss.   Mental status -patient is a little forgetful.  However she answers questions appropriately. Mouth -mouth is dry. Neck - supple, no significant adenopathy Chest - clear to auscultation, no wheezes, rales or rhonchi, symmetric air entry Heart - normal rate, regular rhythm, normal S1, S2, no murmurs, rubs, clicks or gallops Extremities - peripheral pulses normal, no pedal edema, no clubbing or cyanosis   CMP Latest Ref Rng & Units 10/10/2020 09/25/2020 09/16/2020  Glucose 70 - 99 mg/dL 109(H) 153(H) 82  BUN 8 - 23 mg/dL 16 9 12   Creatinine 0.44 - 1.00 mg/dL 0.75 0.68 0.55  Sodium 135 - 145 mmol/L 139 138 140  Potassium 3.5 - 5.1 mmol/L 4.5 3.5 3.7  Chloride 98 - 111 mmol/L 103 103 104  CO2 22 - 32 mmol/L 26 25 29   Calcium 8.9 - 10.3 mg/dL 9.5 8.4(L) 8.4(L)  Total Protein 6.5 - 8.1 g/dL - - 5.1(L)  Total Bilirubin 0.3 - 1.2 mg/dL - - 0.6  Alkaline Phos 38 - 126 U/L - - 39  AST 15 - 41 U/L - - 23  ALT 0 - 44 U/L - - 15   Lipid Panel     Component Value Date/Time   CHOL 133 06/02/2020 0845  TRIG 84 06/02/2020 0845   HDL 67 06/02/2020  0845   CHOLHDL 2.0 06/02/2020 0845   CHOLHDL 2.5 02/26/2016 1132   VLDL 28 02/26/2016 1132   LDLCALC 50 06/02/2020 0845    CBC    Component Value Date/Time   WBC 9.6 10/10/2020 1545   RBC 3.64 (L) 10/10/2020 1545   HGB 11.6 (L) 10/10/2020 1545   HGB 13.8 06/02/2020 0845   HCT 35.8 (L) 10/10/2020 1545   HCT 39.7 06/02/2020 0845   PLT 134 (L) 10/10/2020 1545   PLT 114 (L) 06/02/2020 0845   MCV 98.4 10/10/2020 1545   MCV 92 06/02/2020 0845   MCH 31.9 10/10/2020 1545   MCHC 32.4 10/10/2020 1545   RDW 16.9 (H) 10/10/2020 1545   RDW 13.0 06/02/2020 0845   LYMPHSABS 1.3 10/10/2020 1545   LYMPHSABS 2.9 11/19/2016 1223   MONOABS 1.0 10/10/2020 1545   EOSABS 0.1 10/10/2020 1545   EOSABS 0.2 11/19/2016 1223   BASOSABS 0.0 10/10/2020 1545   BASOSABS 0.0 11/19/2016 1223    ASSESSMENT AND PLAN: 1. Essential hypertension Blood pressure holding fine at this time without medications.  We will continue to observe for now.  2. Centrilobular emphysema ([place]) Patient will continue to use her current inhalers.  Strongly advised her to discontinue smoking.  3. Tobacco dependence Advised to discontinue smoking.  Informed her that it has been shown that even after cancer diagnosis that may be associated with cigarette smoking like lung cancer (she has laryngeal cancer) quitting smoking can prolong life by almost 2 years.  She will continue to work on cutting back.  4. Severe protein-calorie malnutrition ([place]) 5. Weight loss, unintentional Recommend increasing Ensure to 3 to 4 cans a day.  Prescription given to see whether her insurance will pay for the Ensure for her since she is having to pay out-of-pocket.  6. Coronary atherosclerosis due to lipid rich plaque Advised patient that she can crush the atorvastatin pill and put it through the feeding tube.  7. Hep C w/o coma, chronic (HCC) - US Abdomen Limited RUQ (LIVER/GB); Future  8. Decreased hearing of right ear Referral to ENT  submitted.    Patient was given the opportunity to ask questions.  Patient verbalized understanding of the plan and was able to repeat key elements of the plan.   No orders of the defined types were placed in this encounter.    Requested Prescriptions    No prescriptions requested or ordered in this encounter    No follow-ups on file.  Karle Plumber, MD, FACP

## 2020-12-14 ENCOUNTER — Other Ambulatory Visit: Payer: Self-pay

## 2020-12-14 ENCOUNTER — Telehealth: Payer: Self-pay

## 2020-12-14 ENCOUNTER — Encounter: Payer: Self-pay | Admitting: Hematology and Oncology

## 2020-12-14 ENCOUNTER — Inpatient Hospital Stay: Payer: Medicare Other | Attending: Hematology and Oncology | Admitting: Hematology and Oncology

## 2020-12-14 VITALS — BP 105/61 | HR 81 | Temp 98.0°F | Resp 18 | Wt 113.0 lb

## 2020-12-14 DIAGNOSIS — R634 Abnormal weight loss: Secondary | ICD-10-CM

## 2020-12-14 DIAGNOSIS — Z79899 Other long term (current) drug therapy: Secondary | ICD-10-CM | POA: Diagnosis not present

## 2020-12-14 DIAGNOSIS — C321 Malignant neoplasm of supraglottis: Secondary | ICD-10-CM | POA: Diagnosis not present

## 2020-12-14 DIAGNOSIS — F17210 Nicotine dependence, cigarettes, uncomplicated: Secondary | ICD-10-CM | POA: Insufficient documentation

## 2020-12-14 DIAGNOSIS — M069 Rheumatoid arthritis, unspecified: Secondary | ICD-10-CM | POA: Diagnosis not present

## 2020-12-14 NOTE — Telephone Encounter (Signed)
error 

## 2020-12-14 NOTE — Progress Notes (Signed)
Raven Torres CONSULT NOTE  Patient Care Team: Ladell Pier, MD as PCP - General (Internal Medicine) Debara Pickett Nadean Corwin, MD as PCP - Cardiology (Cardiology) Eppie Gibson, MD as Consulting Physician (Radiation Oncology) Benay Pike, MD as Consulting Physician (Hematology and Oncology) Malmfelt, Stephani Police, RN as Oncology Nurse Navigator  CHIEF COMPLAINTS/PURPOSE OF CONSULTATION:   Supraglottic cancer   ASSESSMENT & PLAN:   This is a very pleasant 65 year old female patient with past medical history significant for hypertension, rheumatoid arthritis, hepatitis C induced cirrhosis  s/p chemo radiation with weekly cisplatin for 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 She is here for follow up after completion of radiation. No clinical evidence for residual or recurrent disease. She will proceed with end of treatment PET scan and return to clinic for FU.  2 Weight loss or inability to gain, likely from chemoradiation She says she has been eating well but she has a problem gaining weight. She has been using the G tube to put some boost, about 3 a day. She was encouraged to try bigger portions, increase boost to 4 a day if possible. Continue follow up with nutrition.  3. Encouraged smoking cessation.  4. RA, encouraged discussing with her rheumatologist about medication for RA.    Thank you for consulting [place] in the care of this patient.  Please do not hesitate to contact us with any additional questions or concerns.  HISTORY OF PRESENTING ILLNESS:   Raven Torres 65 y.o. female is here because of new diagnosis of laryngeal cancer  Oncology History Overview Note   Raven Torres is a 65 y.o. female who presents for evaluation of weight loss and enlarged right neck node.  She had a CT scan of her neck performed a couple weeks ago that demonstrated a supraglottic mass with enlarged right neck node consistent with supraglottic cancer  and metastasis to right neck node. She has had a approximate 30 pound weight loss over the past 4 months.  She is having no airway problems or hoarseness.Raven Torres is a 65 y.o. female who presents for evaluation of weight loss and enlarged right neck node. She has had a approximate 30 pound weight loss over the past 4 months.  She has no hoarseness or airway problems. On fiberoptic laryngoscopy she has abnormality of the laryngeal surface of the epiglottis and is taken to the operating room for direct laryngoscopy and biopsy.  06/16/2020 2.0 x 3.2 x 2.5 cm supraglottic laryngeal mass involving the epiglottis and extending along the aryepiglottic folds bilaterally. Tumor extends into the pre-epiglottic fat and likely into the vallecula. Tumor extends into the upper thyroid cartilage bilaterally, abutting and possibly extending through the outer cortex (particularly on the left). This almost certainly reflects a supraglottic laryngeal squamous cell carcinoma  06/26/2020, she had laryngoscopy which showed there was a friable ulcerative tumor involving predominantly the  supraglottic area above the vocal cords as the vocal cords were clear to evaluation.  This extended up the laryngeal surface of epiglottis, more on the right side. A direct laryngoscopy was also performed  of the piriform sinuses which were clear bilaterally.  06/28/2020  1. The supraglottic mass has a maximum SUV of 12.5 and the enlarged right level II lymph node has a maximum SUV of 16.9, both compatible with malignancy. No findings of metastatic disease to the chest, abdomen, pelvis, or regional skeleton. 2. New lingular ground-glass opacity compared to 01/20/2020 with very low-grade activity, probably from  alveolitis or low-grade atypical infection. 3. Aortic Atherosclerosis (ICD10-I70.0) and Emphysema (ICD10-J43.9). 4. Airway thickening is present, suggesting bronchitis or reactive airways disease. 5. Hepatic  cirrhosis with left periaortic portosystemic collateral vessels indicating portal venous hypertension. 6.  Prominent stool throughout the colon favors constipation.  Her case was discussed in the tumor board, and radiology review suggested that there is no definitive evidence of T4 involvement, hence plan was to consider concurrent chemoradiation and thus medical oncology referral.  She completed 5 weekly cycles of cisplatin 08/2020.    Malignant neoplasm of supraglottis ([place])  07/10/2020 Cancer Staging   Staging form: Larynx - Supraglottis, AJCC 8th Edition - Clinical stage from 07/10/2020: Stage IVA (cT3, cN2a, cM0) - Signed by Eppie Gibson, MD on 07/14/2020   07/13/2020 Initial Diagnosis   Laryngeal cancer ([facility])   07/25/2020 -  Chemotherapy    Patient is on Treatment Plan: HEAD/NECK CISPLATIN Q7D       Interval history  She is here for FU by herself. She says she is trying to gain weight, eating as best as she can. She has putting boost in her G tube thrice a day. She feels more energetic though, staying active Smoking about 2/3 cig a day Cough and ongoing sputum production, no worsening SOB No change in bowel habits,  No change in urinary habits. No hearing changes, no neuropathy, or other residual toxicity from chemo  Rest of the pertinent 10 point ROS reviewed and negative  MEDICAL HISTORY:  Past Medical History:  Diagnosis Date  . Arthritis   . Cataract    removed both eyes   . Colitis   . COPD (chronic obstructive pulmonary disease) ([facility])   . Hepatitis C   . Hyperlipidemia   . Hypertension   . Shingles 03/27/2020  . TB (tuberculosis)    treatment     SURGICAL HISTORY: Past Surgical History:  Procedure Laterality Date  . ABDOMINAL HYSTERECTOMY    . CATARACT EXTRACTION, BILATERAL    . COLONOSCOPY    . COLONOSCOPY  04/04/2020  . DIRECT LARYNGOSCOPY N/A 06/26/2020   Procedure: DIRECT LARYNGOSCOPY AND BIOPSY;  Surgeon: Rozetta Nunnery, MD;  Location:  [facility];  Service: ENT;  Laterality: N/A;  . IR GASTROSTOMY TUBE MOD SED  07/27/2020  . IR IMAGING GUIDED PORT INSERTION  07/27/2020  . KNEE SURGERY Right   . POLYPECTOMY  11/17/2008   HPP x 1     SOCIAL HISTORY: Social History   Socioeconomic History  . Marital status: Married    Spouse name: Not on file  . Number of children: Not on file  . Years of education: Not on file  . Highest education level: Not on file  Occupational History  . Not on file  Tobacco Use  . Smoking status: Current Every Day Smoker    Packs/day: 1.00    Years: 51.00    Pack years: 51.00    Types: Cigarettes    Start date: 64  . Smokeless tobacco: Never Used  . Tobacco comment: 4 cigs a day   Vaping Use  . Vaping Use: Never used  Substance and Sexual Activity  . Alcohol use: No    Alcohol/week: 0.0 standard drinks  . Drug use: No  . Sexual activity: Not Currently  Other Topics Concern  . Not on file  Social History Narrative  . Not on file   Social Determinants of Health   Financial Resource Strain: Not on file  Food Insecurity: No Food Insecurity  .  Worried About Charity fundraiser in the Last Year: Never true  . Ran Out of Food in the Last Year: Never true  Transportation Needs: No Transportation Needs  . Lack of Transportation (Medical): No  . Lack of Transportation (Non-Medical): No  Physical Activity: Not on file  Stress: Not on file  Social Connections: Not on file  Intimate Partner Violence: Not on file    FAMILY HISTORY: Family History  Problem Relation Age of Onset  . Heart disease Mother   . Cancer Mother   . Melanoma Mother   . Esophageal cancer Father   . Colon cancer Neg Hx   . Colon polyps Neg Hx   . Rectal cancer Neg Hx   . Stomach cancer Neg Hx     ALLERGIES:  is allergic to tudorza pressair [aclidinium bromide], amitriptyline, codeine, and tramadol.  MEDICATIONS:  Current Outpatient Medications  Medication Sig Dispense Refill  . albuterol (PROVENTIL)  (2.5 MG/3ML) 0.083% nebulizer solution Take 3 mLs (2.5 mg total) by nebulization every 6 (six) hours as needed for wheezing or shortness of breath. 150 mL 1  . albuterol (VENTOLIN HFA) 108 (90 Base) MCG/ACT inhaler INHALE 2 PUFFS INTO THE LUNGS EVERY 6 HOURS AS NEEDED FOR WHEEZING OR SHORTNESS OF BREATH (Patient taking differently: Inhale 2 puffs into the lungs every 6 (six) hours as needed for wheezing or shortness of breath.) 8.5 g 3  . BEVESPI AEROSPHERE 9-4.8 MCG/ACT AERO INHALE 2 PUFFS INTO THE LUNGS TWICE DAILY (Patient taking differently: Inhale 2 puffs into the lungs in the morning and at bedtime.) 10.7 g 1  . chlorhexidine (PERIDEX) 0.12 % solution Use as directed 15 mLs in the mouth or throat 2 (two) times daily. 120 mL 0  . dexamethasone (DECADRON) 4 MG tablet Take 2 tablets (8 mg total) by mouth daily. Take daily x 3 days starting the day after cisplatin chemotherapy. Take with food. (Patient not taking: Reported on 10/18/2020) 30 tablet 1  . guaiFENesin-dextromethorphan (ROBITUSSIN DM) 100-10 MG/5ML syrup Take 10 mLs by mouth every 4 (four) hours as needed for cough. (Patient not taking: Reported on 10/18/2020) 118 mL 0  . lactose free nutrition (BOOST) LIQD Take 237 mLs by mouth 3 (three) times daily between meals.    . lidocaine (XYLOCAINE) 2 % solution Patient: Mix 1part 2% viscous lidocaine, 1part H20. Swallow 11mL of diluted mixture, 68min before meals and at bedtime, up to QID (Patient taking differently: Use as directed 15 mLs in the mouth or throat in the morning, at noon, in the evening, and at bedtime. Patient: Mix 1part 2% viscous lidocaine, 1part H20. Swallow 38mL of diluted mixture, 17min before meals and at bedtime, up to QID) 200 mL 4  . loperamide (IMODIUM) 2 MG capsule Take 1 capsule (2 mg total) by mouth as needed for diarrhea or loose stools. 30 capsule 0  . ondansetron (ZOFRAN) 8 MG tablet Take 1 tablet (8 mg total) by mouth 2 (two) times daily as needed. Start on the third  day after cisplatin chemotherapy. (Patient taking differently: Take 8 mg by mouth 2 (two) times daily as needed for refractory nausea / vomiting. Start on the third day after cisplatin chemotherapy.) 30 tablet 1  . pantoprazole (PROTONIX) 40 MG tablet Take 1 tablet (40 mg total) by mouth daily for 10 days. 10 tablet 0  . prochlorperazine (COMPAZINE) 10 MG tablet Take 1 tablet (10 mg total) by mouth every 6 (six) hours as needed (Nausea or vomiting). (Patient taking differently:  Take 10 mg by mouth every 6 (six) hours as needed for nausea or vomiting (Nausea or vomiting).) 30 tablet 1   No current facility-administered medications for this visit.    PHYSICAL EXAMINATION: ECOG PERFORMANCE STATUS: 0 - Asymptomatic  Vitals:   12/14/20 1404  BP: 105/61  Pulse: 81  Resp: 18  Temp: 98 F (36.7 C)  SpO2: 100%   Filed Weights   12/14/20 1404  Weight: 113 lb (51.3 kg)    Physical Exam Constitutional:      Appearance: Normal appearance.  HENT:     Head: Normocephalic and atraumatic.     Mouth/Throat:     Pharynx: No oropharyngeal exudate or posterior oropharyngeal erythema.  Eyes:     Extraocular Movements: Extraocular movements intact.     Pupils: Pupils are equal, round, and reactive to light.  Cardiovascular:     Rate and Rhythm: Normal rate and regular rhythm.     Pulses: Normal pulses.     Heart sounds: Normal heart sounds.  Pulmonary:     Effort: Pulmonary effort is normal.     Breath sounds: Normal breath sounds.  Abdominal:     General: Abdomen is flat. There is no distension.     Palpations: Abdomen is soft. There is no mass.     Tenderness: There is no abdominal tenderness.     Comments: PEG site looks clean  Musculoskeletal:        General: No swelling or tenderness. Normal range of motion.     Cervical back: Normal range of motion and neck supple. Tenderness: rad changes, no ulceration.  Lymphadenopathy:     Cervical: No cervical adenopathy (No palpable cervical  lymphadenopathy).  Skin:    General: Skin is warm and dry.  Neurological:     General: No focal deficit present.     Mental Status: She is alert.  Psychiatric:        Mood and Affect: Mood normal.       LABORATORY DATA:  I have reviewed the data as listed Lab Results  Component Value Date   WBC 9.6 10/10/2020   HGB 11.6 (L) 10/10/2020   HCT 35.8 (L) 10/10/2020   MCV 98.4 10/10/2020   PLT 134 (L) 10/10/2020     Chemistry      Component Value Date/Time   NA 139 10/10/2020 1545   NA 139 06/02/2020 0845   K 4.5 10/10/2020 1545   CL 103 10/10/2020 1545   CO2 26 10/10/2020 1545   BUN 16 10/10/2020 1545   BUN 12 06/02/2020 0845   CREATININE 0.75 10/10/2020 1545   CREATININE 0.68 09/25/2020 1607   CREATININE 0.70 02/26/2016 1143      Component Value Date/Time   CALCIUM 9.5 10/10/2020 1545   ALKPHOS 39 09/16/2020 0310   AST 23 09/16/2020 0310   ALT 15 09/16/2020 0310   BILITOT 0.6 09/16/2020 0310   BILITOT 0.5 06/02/2020 0845      RADIOGRAPHIC STUDIES: I have personally reviewed the radiological images as listed and agreed with the findings in the report. No results found.   All questions were answered. The patient knows to call the clinic with any problems, questions or concerns.    Benay Pike, MD 12/14/2020 2:23 PM

## 2020-12-18 ENCOUNTER — Ambulatory Visit (HOSPITAL_COMMUNITY)
Admission: RE | Admit: 2020-12-18 | Discharge: 2020-12-18 | Disposition: A | Discharge status: Home / Self Care | Payer: Medicare Other | Source: Ambulatory Visit | Attending: Internal Medicine | Admitting: Internal Medicine

## 2020-12-18 ENCOUNTER — Encounter (HOSPITAL_COMMUNITY): Payer: Self-pay

## 2020-12-18 ENCOUNTER — Other Ambulatory Visit: Payer: Self-pay

## 2020-12-18 DIAGNOSIS — B182 Chronic viral hepatitis C: Secondary | ICD-10-CM

## 2020-12-21 ENCOUNTER — Ambulatory Visit: Payer: Medicare Other | Attending: Radiation Oncology

## 2020-12-21 ENCOUNTER — Other Ambulatory Visit: Payer: Self-pay

## 2020-12-21 DIAGNOSIS — R1310 Dysphagia, unspecified: Secondary | ICD-10-CM | POA: Diagnosis not present

## 2020-12-21 NOTE — Therapy (Signed)
[facility] [address] [place], [place], [address] Phone: [phone]   Fax:  [phone]  Speech Language Pathology Treatment/Renewal Summary  Patient Details  Name: Raven Torres MRN: [id number] Date of Birth: May 04, 1956 Referring Provider (SLP): Eppie Gibson, MD   Encounter Date: 12/21/2020   End of Session - 12/21/20 1620    Visit Number 4    Number of Visits 7    Date for SLP Re-Evaluation 03/01/21    SLP Start Time 1455    SLP Stop Time  1535    SLP Time Calculation (min) 40 min    Activity Tolerance Patient tolerated treatment well           Past Medical History:  Diagnosis Date  . Arthritis   . Cataract    removed both eyes   . Colitis   . COPD (chronic obstructive pulmonary disease) ([facility])   . Hepatitis C   . Hyperlipidemia   . Hypertension   . Shingles 03/27/2020  . TB (tuberculosis)    treatment     Past Surgical History:  Procedure Laterality Date  . ABDOMINAL HYSTERECTOMY    . CATARACT EXTRACTION, BILATERAL    . COLONOSCOPY    . COLONOSCOPY  04/04/2020  . DIRECT LARYNGOSCOPY N/A 06/26/2020   Procedure: DIRECT LARYNGOSCOPY AND BIOPSY;  Surgeon: Rozetta Nunnery, MD;  Location: [facility];  Service: ENT;  Laterality: N/A;  . IR GASTROSTOMY TUBE MOD SED  07/27/2020  . IR IMAGING GUIDED PORT INSERTION  07/27/2020  . KNEE SURGERY Right   . POLYPECTOMY  11/17/2008   HPP x 1     There were no vitals filed for this visit.   Subjective Assessment - 12/21/20 1505    Subjective "Two to three Boosts a day - I had a salad and some pierogies."    Currently in Pain? No/denies                 ADULT SLP TREATMENT - 12/21/20 1506      General Information   Behavior/Cognition Alert;Pleasant mood;Cooperative      Treatment Provided   Treatment provided Dysphagia      Dysphagia Treatment   Temperature Spikes Noted No   no dypsnea,  no chest pain or tightness or other overt s/sx  aspiration PNA   Treatment Methods Therapeutic exercise;Patient/caregiver education;Skilled observation    Patient observed directly with PO's Yes    Type of PO's observed Dysphagia 3 (soft);Thin liquids    Liquids provided via --   bottle   Oral Phase Signs & Symptoms Prolonged mastication    Pharyngeal Phase Signs & Symptoms Other (comment)   none noted - no wet voice, coughing, or throat clearing with POs   Other treatment/comments Pt's voice sounds better than previous visit. Raven Torres reports she went to ENT and stated, per ENT Dr. Lucia Gaskins she "has an extreme amount of phlegm." Pt states she thinks the amouont is no more or less than at that appointment on 11-28-20. She has COPD so she tells SLP she has phlegm normally, but with the radiation the phlegm is more than with just her COPD. Raven Torres is coughing through this session - breath sounds from last week (12-14-20) appointment with Dr. Chryl Heck were taken as normal. Pt states that dysgeusia/ageusia is inhibiting her from eating some things. She states she is probably 50% PO and 50% PEG - SLP told pt she will need to tihnk of eating more as a chore now instead  of mainly for pleasure. SLP encouraged pt to eat some things she mihgt not necessarily enjoy -but thta she TOLERATES in order to gain/maintain weight. Pt again tells SLP she has not completed dysphagia HEP as prescribed (reports she has performed 1-2 exercises on the HEP once a week). SLP repeated the rationale for HEP to pt, "I'll have to do better" she stated. SLP suggested pt be seen in approx 8 weeks due to swallowing safety WNL today but pt with incr'd phlegm. SLP does not think that pt has aspiration PNA at this time given her success with POs today and no reported coughing/throat clearing/wet voice with POs. If she remains with WNL swallowing next session but scope and frequency of HEP copmletion has not incr'd pt will be d/c'd.      Assessment / Recommendations / Plan   Plan Continue with current  plan of care      Dysphagia Recommendations   Diet recommendations Dysphagia 3 (mechanical soft);Regular;Thin liquid    Liquids provided via Cup    Medication Administration --   as tolerated     Progression Toward Goals   Progression toward goals Not progressing toward goals (comment)   cont'd choice for limited completion of HEP           SLP Education - 12/21/20 1618    Education Details late effects head/neck radiation on swallow function (rationale for HEP)    Person(s) Educated Patient    Methods Explanation    Comprehension Verbalized understanding            SLP Short Term Goals - 11/23/20 1355      SLP SHORT TERM GOAL #1   Title pt will complete HEP with rare min A  in 2 sessions    Period --   sessions, for all STGs   Status Not Met      SLP SHORT TERM GOAL #2   Title pt will tell SLP why pt is completing HEP with rare min A, then be able to repeat to SLP after 15 minutes with modified independence    Status Achieved      SLP SHORT TERM GOAL #3   Title pt will describe 3 overt s/s aspiration PNA with modified independence    Status Deferred   added to [place] #4   Title pt will tell SLP how a food journal could hasten return to a more normalized diet    Status Deferred   pt eating satisfactory diet currently without using food journal           SLP Long Term Goals - 12/21/20 1533      SLP LONG TERM GOAL #1   Title pt will complete HEP with rare min A over two visits    Time 2    Period --   visits, for all LTGs   Status On-going      SLP LONG TERM GOAL #2   Title pt will describe how to modify HEP over time, and the timeline associated with reduction in HEP frequency using written cues    Time 3    Status On-going      SLP LONG TERM GOAL #3   Title pt will tell SLP 3 overt s/sx of aspiration PNA with modified independent    Status Achieved            Plan - 12/21/20 1623    Clinical Impression Statement At this time  pt swallowing is deemed WNL/WFL with dys III items (cheese stick) and thin liquids. SLP reviewed pt's completion of her individualized HEP - Raven Torres has again suboptimally completed frequency and scope of HEP (1-2 exercises, once a week). There are no overt s/s aspiration with POs reported by pt at this time and pt today had no overt s/s aspiration with POs. Data indicate that pt's swallow ability could very well decline over time following conclusion of their radiation therapy due to muscle disuse atrophy and/or muscle fibrosis. Raven Torres will cont to need to be seen by SLP in order to assess safety of PO intake, assess the need for recommending any objective swallow assessment, and ensuring pt correctly completes the individualized HEP. If she continues with suboptimal frequency and scope of HEP but swallowing looks WNL/WFL in July - she will likely be d/c'd from [facility]. She was not d/c'd today due to phlegm and coughing during session, not associated in any way with POs.    Speech Therapy Frequency --   once every approx 4 weeks   Duration --   7 total visits   Treatment/Interventions Aspiration precaution training;Pharyngeal strengthening exercises;Diet toleration management by SLP;Trials of upgraded texture/liquids;Patient/family education;SLP instruction and feedback;Compensatory strategies    Potential to Achieve Goals Good    Potential Considerations Ability to learn/carryover information   pt reports she has memory difficulties   SLP Home Exercise Plan provided today    Consulted and Agree with Plan of Care Patient           Patient will benefit from skilled therapeutic intervention in order to improve the following deficits and impairments:   Dysphagia, unspecified type    Problem List Patient Active Problem List   Diagnosis Date Noted  . Protein-calorie malnutrition, severe 09/13/2020  . Hypotension 09/12/2020  . Tobacco dependence 08/11/2020  . Malignant neoplasm of supraglottis  ([facility]) 07/13/2020  . Herpes zoster without complication 88/41/6606  . GOLD COPD III B 04/23/2018  . Pruritic dermatitis 04/23/2018  . High cholesterol 06/24/2017  . Shortness of breath 06/04/2017  . Coronary artery calcification 06/04/2017  . Other fatigue 06/04/2017  . Centrilobular emphysema ([facility]) 05/15/2017  . Atherosclerosis of arteries 05/15/2017  . Poor memory 05/15/2017  . TB lung, latent 04/16/2017  . Lung nodules 04/16/2017  . Diabetes mellitus screening 11/19/2016  . Fibromyalgia 05/15/2015  . Rheumatoid arthritis ([facility]) 08/22/2014  . HTN (hypertension) 08/22/2014  . Tobacco abuse 08/22/2014  . Hepatic cirrhosis ([facility]) 05/31/2014  . Chronic hepatitis C virus infection ([facility]) 05/03/2014    Raven Torres ,MS, [place]  12/21/2020, 4:25 PM  [facility] [address] [place], [place], [address] Phone: [phone]   Fax:  [phone]   Name: Raven Torres MRN: [id number] Date of Birth: 11-28-1955

## 2020-12-23 DIAGNOSIS — J441 Chronic obstructive pulmonary disease with (acute) exacerbation: Secondary | ICD-10-CM | POA: Diagnosis not present

## 2020-12-25 ENCOUNTER — Ambulatory Visit: Payer: Medicare Other | Admitting: Internal Medicine

## 2021-01-03 ENCOUNTER — Encounter: Payer: Medicare Other | Admitting: Nutrition

## 2021-01-03 ENCOUNTER — Telehealth: Payer: Self-pay | Admitting: Nutrition

## 2021-01-03 NOTE — Telephone Encounter (Signed)
Telephone follow up completed with patient. She completed treatment for Larynx cancer on Feb 21st. She has a feeding tube but hates it. She does not tolerate Osmolite 1.5 and doesn't like to use flavored oral nutrition supplements because it makes the tube gross. She reports eating whatever she wants and gives examples of salad, green beans, pinto beans, nuts, potatoes, pasta, rice and roast beef subway sandwich. She "stays hungry" and doesn't know why she isn't gaining weight. She averages 2 cartons of Boost plus via feeding tube daily. She has tried to eat more sweets. Complains of continuing taste alterations.  Weight decreased to 113 pounds on May 12 from 121 pounds on March 16. Labs reviewed. Safe for Dysphagia 3 diet per SLP. PET scheduled for Friday, June 10.  Educated to increase to minimum of 3 cartons of Boost Plus daily. She doesn't want to drink them so will use her feeding tube. Provided samples and coupons. Increase high calorie, high protein foods and try eating every 3 hours. Call RD as needed but will schedule a phone follow up in approximately one month.

## 2021-01-11 ENCOUNTER — Other Ambulatory Visit: Payer: Self-pay

## 2021-01-11 ENCOUNTER — Ambulatory Visit: Payer: Medicare Other | Attending: Internal Medicine | Admitting: Pharmacist

## 2021-01-11 ENCOUNTER — Encounter: Payer: Self-pay | Admitting: Pharmacist

## 2021-01-11 VITALS — BP 155/68 | HR 74 | Temp 98.3°F | Ht 65.0 in | Wt 115.2 lb

## 2021-01-11 DIAGNOSIS — Z0000 Encounter for general adult medical examination without abnormal findings: Secondary | ICD-10-CM | POA: Diagnosis not present

## 2021-01-11 NOTE — Progress Notes (Signed)
Subjective:   Raven Torres is a 65 y.o. female who presents for Medicare Annual (Subsequent) preventive examination.     Objective:    Today's Vitals   01/11/21 0952 01/11/21 0953  BP: (!) 155/68   Pulse: 74   Temp: 98.3 F (36.8 C)   SpO2: 96%   Weight: 115 lb 3.2 oz (52.3 kg)   Height: 5\' 5"  (1.651 m)   PainSc: 0-No pain 0-No pain   Body mass index is 19.17 kg/m.  Advanced Directives 01/11/2021 10/10/2020 09/12/2020 09/12/2020 09/07/2020 09/05/2020 08/31/2020  Does Patient Have a Medical Advance Directive? No No No Unable to assess, patient is non-responsive or altered mental status No No No  Type of Advance Directive - - - - - - -  Does patient want to make changes to medical advance directive? - - No - Patient declined No - Patient declined No - Patient declined - -  Would patient like information on creating a medical advance directive? No - Patient declined No - Patient declined No - Patient declined - No - Patient declined No - Patient declined -    Current Medications (verified) Outpatient Encounter Medications as of 01/11/2021  Medication Sig   albuterol (PROVENTIL) (2.5 MG/3ML) 0.083% nebulizer solution Take 3 mLs (2.5 mg total) by nebulization every 6 (six) hours as needed for wheezing or shortness of breath.   albuterol (VENTOLIN HFA) 108 (90 Base) MCG/ACT inhaler INHALE 2 PUFFS INTO THE LUNGS EVERY 6 HOURS AS NEEDED FOR WHEEZING OR SHORTNESS OF BREATH (Patient taking differently: Inhale 2 puffs into the lungs every 6 (six) hours as needed for wheezing or shortness of breath.)   BEVESPI AEROSPHERE 9-4.8 MCG/ACT AERO INHALE 2 PUFFS INTO THE LUNGS TWICE DAILY (Patient taking differently: Inhale 2 puffs into the lungs in the morning and at bedtime.)   chlorhexidine (PERIDEX) 0.12 % solution Use as directed 15 mLs in the mouth or throat 2 (two) times daily.   lactose free nutrition (BOOST) LIQD Take 237 mLs by mouth 3 (three) times daily between meals.   lidocaine (XYLOCAINE) 2 %  solution Patient: Mix 1part 2% viscous lidocaine, 1part H20. Swallow 40mL of diluted mixture, 60min before meals and at bedtime, up to QID (Patient taking differently: Use as directed 15 mLs in the mouth or throat in the morning, at noon, in the evening, and at bedtime. Patient: Mix 1part 2% viscous lidocaine, 1part H20. Swallow 71mL of diluted mixture, 61min before meals and at bedtime, up to QID)   loperamide (IMODIUM) 2 MG capsule Take 1 capsule (2 mg total) by mouth as needed for diarrhea or loose stools.   No facility-administered encounter medications on file as of 01/11/2021.    Allergies (verified) Tudorza pressair [aclidinium bromide], Amitriptyline, Codeine, and Tramadol   History: Past Medical History:  Diagnosis Date   Arthritis    Cataract    removed both eyes    Colitis    COPD (chronic obstructive pulmonary disease) ([place])    Hepatitis C    Hyperlipidemia    Hypertension    Shingles 03/27/2020   TB (tuberculosis)    treatment    Past Surgical History:  Procedure Laterality Date   ABDOMINAL HYSTERECTOMY     CATARACT EXTRACTION, BILATERAL     COLONOSCOPY     COLONOSCOPY  04/04/2020   DIRECT LARYNGOSCOPY N/A 06/26/2020   Procedure: DIRECT LARYNGOSCOPY AND BIOPSY;  Surgeon: Rozetta Nunnery, MD;  Location: [facility];  Service: ENT;  Laterality: N/A;  IR GASTROSTOMY TUBE MOD SED  07/27/2020   IR IMAGING GUIDED PORT INSERTION  07/27/2020   KNEE SURGERY Right    POLYPECTOMY  11/17/2008   HPP x 1    Family History  Problem Relation Age of Onset   Heart disease Mother    Cancer Mother    Melanoma Mother    Esophageal cancer Father    Colon cancer Neg Hx    Colon polyps Neg Hx    Rectal cancer Neg Hx    Stomach cancer Neg Hx    Social History   Socioeconomic History   Marital status: Married    Spouse name: Not on file   Number of children: Not on file   Years of education: Not on file   Highest education level: Not on file  Occupational History   Not on  file  Tobacco Use   Smoking status: Every Day    Packs/day: 1.00    Years: 51.00    Pack years: 51.00    Types: Cigarettes    Start date: 69   Smokeless tobacco: Never   Tobacco comments:    4 cigs a day   Vaping Use   Vaping Use: Never used  Substance and Sexual Activity   Alcohol use: No    Alcohol/week: 0.0 standard drinks   Drug use: No   Sexual activity: Not Currently  Other Topics Concern   Not on file  Social History Narrative   Not on file   Social Determinants of Health   Financial Resource Strain: Not on file  Food Insecurity: No Food Insecurity   Worried About Running Out of Food in the Last Year: Never true   Ran Out of Food in the Last Year: Never true  Transportation Needs: No Transportation Needs   Lack of Transportation (Medical): No   Lack of Transportation (Non-Medical): No  Physical Activity: Not on file  Stress: Not on file  Social Connections: Not on file    Tobacco Counseling Ready to quit: Not Answered Counseling given: Not Answered Tobacco comments: 4 cigs a day    Clinical Intake:  Pre-visit preparation completed: No  Pain : No/denies pain Pain Score: 0-No pain     Diabetes: No  How often do you need to have someone help you when you read instructions, pamphlets, or other written materials from your doctor or pharmacy?: 2 - Rarely  Diabetic?No   Interpreter Needed?: No      Activities of Daily Living In your present state of health, do you have any difficulty performing the following activities: 01/11/2021 09/12/2020  Hearing? N N  Vision? N N  Difficulty concentrating or making decisions? Y Y  Comment - some trouble if just woke up  Walking or climbing stairs? N Y  Dressing or bathing? N N  Doing errands, shopping? N N  Preparing Food and eating ? N -  Using the Toilet? N -  In the past six months, have you accidently leaked urine? N -  Do you have problems with loss of bowel control? N -  Managing your Medications? N  -  Managing your Finances? N -  Housekeeping or managing your Housekeeping? N -  Some recent data might be hidden    Patient Care Team: Ladell Pier, MD as PCP - General (Internal Medicine) Debara Pickett Nadean Corwin, MD as PCP - Cardiology (Cardiology) Eppie Gibson, MD as Consulting Physician (Radiation Oncology) Benay Pike, MD as Consulting Physician (Hematology and Oncology) [place], Stephani Police,  RN as Oncology Nurse Navigator  Indicate any recent Medical Services you may have received from other than [facility] providers in the past year (date may be approximate).     Assessment:   This is a routine wellness examination for [facility]  Hearing/Vision screen No results found.  Dietary issues and exercise activities discussed: Current Exercise Habits: The patient does not participate in regular exercise at present, Exercise limited by: None identified   Goals Addressed   None   Depression Screen PHQ 2/9 Scores 01/11/2021 12/11/2020 08/11/2020 06/08/2020 01/07/2020 05/07/2019 08/06/2018  PHQ - 2 Score 0 1 1 0 0 0 0  PHQ- 9 Score - 4 - 1 - - -    Fall Risk Fall Risk  01/11/2021 12/11/2020 08/11/2020 06/08/2020 05/11/2020  Falls in the past year? 0 0 0 0 0  Comment - - - - -  Number falls in past yr: 0 0 0 - 0  Injury with Fall? 0 0 0 - 0  Risk for fall due to : - - - - -    [place]:  Any stairs in or around the home? Yes  If so, are there any without handrails? No  Home free of loose throw rugs in walkways, pet beds, electrical cords, etc? Yes  Adequate lighting in your home to reduce risk of falls? Yes   ASSISTIVE DEVICES UTILIZED TO PREVENT FALLS:  Life alert? No  Use of a cane, walker or w/c? No  Grab bars in the bathroom? Yes  Shower chair or bench in shower? No  Elevated toilet seat or a handicapped toilet? No   TIMED UP AND GO:  Was the test performed? Yes .  Length of time to ambulate 10 feet: 5 sec.   Gait steady and fast with assistive  device  Cognitive Function: MMSE - Mini Mental State Exam 01/11/2021 05/07/2019 07/22/2017  Orientation to time 5 5 5   Orientation to Place 5 5 5   Registration 3 3 3   Attention/ Calculation 5 5 5   Recall 3 3 2   Language- name 2 objects 2 2 2   Language- repeat 1 1 1   Language- follow 3 step command 3 3 3   Language- read & follow direction 1 1 1   Write a sentence 1 1 1   Copy design 1 1 1   Total score 30 30 29         Immunizations Immunization History  Administered Date(s) Administered   Hepatitis A, Adult 05/03/2014, 10/27/2014   Influenza, Seasonal, Injecte, Preservative Fre 05/03/2014, 05/15/2015   Influenza,inj,Quad PF,6+ Mos 05/03/2014, 05/15/2015, 05/15/2017, 04/23/2018, 05/07/2019   Influenza-Unspecified 04/27/2020   PFIZER(Purple Top)SARS-COV-2 Vaccination 10/03/2019, 11/02/2019, 07/13/2020   Pneumococcal Conjugate-13 05/07/2019   Pneumococcal Polysaccharide-23 11/19/2016   Tdap 11/19/2016   Zoster Recombinat (Shingrix) 06/02/2020    TDAP status: Up to date  Flu Vaccine status: Up to date  Pneumococcal vaccine status: Up to date  Covid-19 vaccine status: Completed vaccines - Has not received her 2nd booster.   Qualifies for Shingles Vaccine? Yes   Zostavax completed No   Shingrix Completed?: No.    Education has been provided regarding the importance of this vaccine. Patient has been advised to call insurance company to determine out of pocket expense if they have not yet received this vaccine. Advised may also receive vaccine at local pharmacy or Health Dept. Verbalized acceptance and understanding.  Screening Tests Health Maintenance  Topic Date Due   Pneumococcal Vaccine 62-37 Years old (1 - PCV)  Never done   DEXA SCAN  Never done   Zoster Vaccines- Shingrix (2 of 2) 07/28/2020   COVID-19 Vaccine (4 - Booster for Pfizer series) 10/11/2020   INFLUENZA VACCINE  03/05/2021   MAMMOGRAM  07/05/2021   PNA vac Low Risk Adult (2 of 2 - PPSV23) 11/19/2021    TETANUS/TDAP  11/20/2026   COLONOSCOPY (Pts 45-29yrs Insurance coverage will need to be confirmed)  04/04/2030   Hepatitis C Screening  Completed   HPV VACCINES  Aged Out    Health Maintenance  Health Maintenance Due  Topic Date Due   Pneumococcal Vaccine 25-67 Years old (1 - PCV) Never done   DEXA SCAN  Never done   Zoster Vaccines- Shingrix (2 of 2) 07/28/2020   COVID-19 Vaccine (4 - Booster for Pfizer series) 10/11/2020    Colorectal cancer screening: Type of screening: Colonoscopy. Completed 04/04/2020. Repeat every 10 years  Mammogram status: Completed 07/2020. Repeat every year  Bone Density status: Ordered - message sent to PCP. Pt provided with contact info and advised to call to schedule appt.  Lung Cancer Screening: (Low Dose CT Chest recommended if Age 72-80 years, 30 pack-year currently smoking OR have quit w/in 15years.) does qualify.   Lung Cancer Screening Referral: CT lung cancer screening ordered but not scheduled yet   Additional Screening:  Hepatitis C Screening: does qualify; Completed 12/18/2020  Vision Screening: Recommended annual ophthalmology exams for early detection of glaucoma and other disorders of the eye. Is the patient up to date with their annual eye exam?  No  Who is the provider or what is the name of the office in which the patient attends annual eye exams? None If pt is not established with a provider, would they like to be referred to a provider to establish care? No .   Dental Screening: Recommended annual dental exams for proper oral hygiene  Community Resource Referral / Chronic Care Management: CRR required this visit?  No   CCM required this visit?  No      Plan:     I have personally reviewed and noted the following in the patient's chart:   Medical and social history Use of alcohol, tobacco or illicit drugs  Current medications and supplements including opioid prescriptions.  Functional ability and status Nutritional  status Physical activity Advanced directives List of other physicians Hospitalizations, surgeries, and ER visits in previous 12 months Vitals Screenings to include cognitive, depression, and falls Referrals and appointments  In addition, I have reviewed and discussed with patient certain preventive protocols, quality metrics, and best practice recommendations. A written personalized care plan for preventive services as well as general preventive health recommendations were provided to patient.     [place], RPH-CPP   01/11/2021

## 2021-01-12 ENCOUNTER — Ambulatory Visit (HOSPITAL_COMMUNITY)
Admission: RE | Admit: 2021-01-12 | Discharge: 2021-01-12 | Disposition: A | Discharge status: Home / Self Care | Payer: Medicare Other | Source: Ambulatory Visit | Attending: Radiation Oncology | Admitting: Radiation Oncology

## 2021-01-12 DIAGNOSIS — I700 Atherosclerosis of aorta: Secondary | ICD-10-CM | POA: Diagnosis not present

## 2021-01-12 DIAGNOSIS — C321 Malignant neoplasm of supraglottis: Secondary | ICD-10-CM

## 2021-01-12 DIAGNOSIS — C101 Malignant neoplasm of anterior surface of epiglottis: Secondary | ICD-10-CM | POA: Diagnosis not present

## 2021-01-12 DIAGNOSIS — J432 Centrilobular emphysema: Secondary | ICD-10-CM | POA: Diagnosis not present

## 2021-01-12 DIAGNOSIS — I2510 Atherosclerotic heart disease of native coronary artery without angina pectoris: Secondary | ICD-10-CM | POA: Insufficient documentation

## 2021-01-12 LAB — GLUCOSE, CAPILLARY: Glucose-Capillary: 106 mg/dL — ABNORMAL HIGH (ref 70–99)

## 2021-01-12 MED ORDER — FLUDEOXYGLUCOSE F - 18 (FDG) INJECTION
5.7500 | Freq: Once | INTRAVENOUS | Status: AC
  Administered 2021-01-12: 09:00:00 5.71 via INTRAVENOUS

## 2021-01-15 ENCOUNTER — Encounter (INDEPENDENT_AMBULATORY_CARE_PROVIDER_SITE_OTHER): Payer: Self-pay | Admitting: Otolaryngology

## 2021-01-15 ENCOUNTER — Ambulatory Visit (INDEPENDENT_AMBULATORY_CARE_PROVIDER_SITE_OTHER): Payer: Medicare Other | Admitting: Otolaryngology

## 2021-01-15 ENCOUNTER — Other Ambulatory Visit: Payer: Self-pay

## 2021-01-15 VITALS — Temp 97.7°F

## 2021-01-15 DIAGNOSIS — Z8521 Personal history of malignant neoplasm of larynx: Secondary | ICD-10-CM | POA: Diagnosis not present

## 2021-01-15 DIAGNOSIS — L299 Pruritus, unspecified: Secondary | ICD-10-CM | POA: Diagnosis not present

## 2021-01-15 NOTE — Progress Notes (Signed)
HPI: Raven Torres is a 65 y.o. female who presents is referred by her PCP for evaluation of chronic itching in her ears.  She frequently uses Q-tips to clean the ears in scratch the itch.  She has had no drainage from the ears and no pain in the ears.. 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  She underwent IMRT chemo radiation therapy as primary treatment for this and completed this 09/25/2020.  She recently underwent a PET scan follow-up that looked relatively good with no obvious persistent disease. She does complain of a chronic dry mouth and chronic thick phlegm in her mouth.  Past Medical History:  Diagnosis Date   Arthritis    Cataract    removed both eyes    Colitis    COPD (chronic obstructive pulmonary disease) ([place])    Hepatitis C    Hyperlipidemia    Hypertension    Shingles 03/27/2020   TB (tuberculosis)    treatment    Past Surgical History:  Procedure Laterality Date   ABDOMINAL HYSTERECTOMY     CATARACT EXTRACTION, BILATERAL     COLONOSCOPY     COLONOSCOPY  04/04/2020   DIRECT LARYNGOSCOPY N/A 06/26/2020   Procedure: DIRECT LARYNGOSCOPY AND BIOPSY;  Surgeon: Rozetta Nunnery, MD;  Location: [facility];  Service: ENT;  Laterality: N/A;   IR GASTROSTOMY TUBE MOD SED  07/27/2020   IR IMAGING GUIDED PORT INSERTION  07/27/2020   KNEE SURGERY Right    POLYPECTOMY  11/17/2008   HPP x 1    Social History   Socioeconomic History   Marital status: Married    Spouse name: Not on file   Number of children: Not on file   Years of education: Not on file   Highest education level: Not on file  Occupational History   Not on file  Tobacco Use   Smoking status: Every Day    Packs/day: 1.00    Years: 51.00    Pack years: 51.00    Types: Cigarettes    Start date: 5   Smokeless tobacco: Never   Tobacco comments:    4 cigs a day   Vaping Use   Vaping  Use: Never used  Substance and Sexual Activity   Alcohol use: No    Alcohol/week: 0.0 standard drinks   Drug use: No   Sexual activity: Not Currently  Other Topics Concern   Not on file  Social History Narrative   Not on file   Social Determinants of Health   Financial Resource Strain: Not on file  Food Insecurity: No Food Insecurity   Worried About Estate manager/land agent of Food in the Last Year: Never true   [place] in the Last Year: Never true  Transportation Needs: No Transportation Needs   Lack of Transportation (Medical): No   Lack of Transportation (Non-Medical): No  Physical Activity: Not on file  Stress: Not on file  Social Connections: Not on file   Family History  Problem Relation Age of Onset   Heart disease Mother    Cancer Mother    Melanoma Mother    Esophageal cancer Father    Colon cancer Neg Hx    Colon polyps Neg Hx    Rectal cancer Neg Hx    Stomach cancer Neg Hx    Allergies  Allergen Reactions   Tudorza Pressair [Aclidinium Bromide]  Itching   Amitriptyline Other (See Comments)    Eye spasms per pt   Codeine Nausea Only   Tramadol Nausea Only   Prior to Admission medications   Medication Sig Start Date End Date Taking? Authorizing Provider  albuterol (PROVENTIL) (2.5 MG/3ML) 0.083% nebulizer solution Take 3 mLs (2.5 mg total) by nebulization every 6 (six) hours as needed for wheezing or shortness of breath. 05/11/20   Ladell Pier, MD  albuterol (VENTOLIN HFA) 108 (90 Base) MCG/ACT inhaler INHALE 2 PUFFS INTO THE LUNGS EVERY 6 HOURS AS NEEDED FOR WHEEZING OR SHORTNESS OF BREATH Patient taking differently: Inhale 2 puffs into the lungs every 6 (six) hours as needed for wheezing or shortness of breath. 03/29/20   Magdalen Spatz, NP  BEVESPI AEROSPHERE 9-4.8 MCG/ACT AERO INHALE 2 PUFFS INTO THE LUNGS TWICE DAILY Patient taking differently: Inhale 2 puffs into the lungs in the morning and at bedtime. 04/11/20   Mannam, Hart Robinsons, MD  chlorhexidine  (PERIDEX) 0.12 % solution Use as directed 15 mLs in the mouth or throat 2 (two) times daily. 09/16/20   Pokhrel, Corrie Mckusick, MD  lactose free nutrition (BOOST) LIQD Take 237 mLs by mouth 3 (three) times daily between meals.    [provider]  lidocaine (XYLOCAINE) 2 % solution Patient: Mix 1part 2% viscous lidocaine, 1part H20. Swallow 46mL of diluted mixture, 36min before meals and at bedtime, up to QID Patient taking differently: Use as directed 15 mLs in the mouth or throat in the morning, at noon, in the evening, and at bedtime. Patient: Mix 1part 2% viscous lidocaine, 1part H20. Swallow 37mL of diluted mixture, 26min before meals and at bedtime, up to QID 07/24/20   Eppie Gibson, MD  loperamide (IMODIUM) 2 MG capsule Take 1 capsule (2 mg total) by mouth as needed for diarrhea or loose stools. 09/16/20   Pokhrel, Corrie Mckusick, MD     Positive ROS: Otherwise negative  All other systems have been reviewed and were otherwise negative with the exception of those mentioned in the HPI and as above.  Physical Exam: Constitutional: Alert, well-appearing, no acute distress Ears: External ears without lesions or tenderness. Ear canals are clear bilaterally.  No signs of infection.  She has dry skin with no swelling.  TMs are clear bilaterally. Nasal: External nose without lesions. Septum with mild deviation. Clear nasal passages otherwise. Oral: Lips and gums without lesions. Tongue and palate mucosa without lesions. Posterior oropharynx clear.  Indirect laryngoscopy revealed clear epiglottis with no epiglottic lesions noted.  Vocal cords were clear bilaterally with normal vocal mobility. Neck: No palpable adenopathy or masses. Respiratory: Breathing comfortably  Skin: No facial/neck lesions or rash noted.  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   Recent PET scan was clear with no obvious persistent disease.  Plan: For the itching in the ears prescribed Diprolene 0.05% cream applying to the ear canal twice daily for 4 days and as needed. She will follow-up here in 4 months for recheck and follow-up of her supraglottic cancer. Reassured her of no evidence of persistent disease on review of the PET scan on clinical exam today.   Radene Journey, MD   CC:

## 2021-01-16 ENCOUNTER — Telehealth: Payer: Self-pay | Admitting: *Deleted

## 2021-01-16 ENCOUNTER — Ambulatory Visit
Admission: RE | Admit: 2021-01-16 | Discharge: 2021-01-16 | Disposition: A | Discharge status: Home / Self Care | Payer: Medicare Other | Source: Ambulatory Visit | Attending: Radiation Oncology | Admitting: Radiation Oncology

## 2021-01-16 VITALS — BP 104/68 | HR 78 | Temp 97.3°F | Resp 18 | Ht 65.0 in | Wt 113.5 lb

## 2021-01-16 DIAGNOSIS — Z79899 Other long term (current) drug therapy: Secondary | ICD-10-CM | POA: Insufficient documentation

## 2021-01-16 DIAGNOSIS — J432 Centrilobular emphysema: Secondary | ICD-10-CM | POA: Diagnosis not present

## 2021-01-16 DIAGNOSIS — C321 Malignant neoplasm of supraglottis: Secondary | ICD-10-CM | POA: Diagnosis not present

## 2021-01-16 DIAGNOSIS — Z08 Encounter for follow-up examination after completed treatment for malignant neoplasm: Secondary | ICD-10-CM | POA: Diagnosis not present

## 2021-01-16 DIAGNOSIS — F17210 Nicotine dependence, cigarettes, uncomplicated: Secondary | ICD-10-CM | POA: Diagnosis not present

## 2021-01-16 NOTE — Telephone Encounter (Signed)
CALLED PATIENT TO INQUIRE IF SHE IS COMING TODAY FOR FU, LVM FOR A RETURN CALL

## 2021-01-18 NOTE — Progress Notes (Signed)
Radiation Oncology         [phone] ________________________________  Name: Raven Torres MRN: [id number]  Date: 01/16/2021  DOB: 1955-10-18  Follow-Up Visit Note  CC: Raven Pier, MD  Raven Pier, MD  Diagnosis and Prior Radiotherapy:       ICD-10-CM   1. 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 Deemed NED yesterday at ENT f/u with Raven Torres.   Per Raven Torres's phone F/U on 01/03/2021:  "--Educated to increase to minimum of 3 cartons of Boost Plus daily. She doesn't want to drink them so will use her feeding tube. --Provided samples and coupons. --Increase high calorie, high protein foods and try eating every 3 hours. --Call RD as needed but will schedule a phone follow up in approximately one month."   Last saw Raven Torres on 12/21/2020: "At this time pt swallowing is deemed WNL/WFL with dys III items (cheese stick) and thin liquids. SLP reviewed pt's completion of her individualized HEP - Raven Torres has again suboptimally completed frequency and scope of HEP (1-2 exercises, once a week). There are no overt s/s aspiration with POs reported by pt at this time and pt today had no overt s/s aspiration with POs."   Using fluoride trays daily? N/A--has denture                       ALLERGIES:  is allergic to tudorza pressair [aclidinium bromide], amitriptyline, codeine, and tramadol.  Meds: Current Outpatient Medications   Medication Sig Dispense Refill   albuterol (PROVENTIL) (2.5 MG/3ML) 0.083% nebulizer solution Take 3 mLs (2.5 mg total) by nebulization every 6 (six) hours as needed for wheezing or shortness of breath. 150 mL 1   albuterol (VENTOLIN HFA) 108 (90 Base) MCG/ACT inhaler INHALE 2 PUFFS INTO THE LUNGS EVERY 6 HOURS AS NEEDED FOR WHEEZING OR SHORTNESS OF BREATH (Patient taking differently: Inhale 2 puffs into the lungs every 6 (six) hours as needed for wheezing or shortness of breath.) 8.5 g 3   BEVESPI AEROSPHERE 9-4.8 MCG/ACT AERO INHALE 2 PUFFS INTO THE LUNGS TWICE DAILY (Patient taking differently: Inhale 2 puffs into the lungs in the morning and at bedtime.) 10.7 g 1   chlorhexidine (PERIDEX) 0.12 % solution Use as directed 15 mLs in the mouth or throat 2 (two) times daily. 120 mL 0   lactose free nutrition (BOOST) LIQD Take 237 mLs by mouth 3 (three) times daily between meals.     lidocaine (XYLOCAINE) 2 % solution Patient: Mix 1part 2% viscous lidocaine, 1part H20. Swallow 74mL of diluted mixture, 36min before meals and at bedtime, up to QID (Patient taking differently: Use as directed 15 mLs in the mouth or throat in the morning, at noon, in the evening, and at bedtime. Patient: Mix 1part 2% viscous lidocaine, 1part H20. Swallow 63mL of diluted mixture, 48min before meals and at bedtime, up to QID) 200 mL 4   loperamide (IMODIUM) 2 MG capsule Take 1 capsule (  2 mg total) by mouth as needed for diarrhea or loose stools. 30 capsule 0   No current facility-administered medications for this encounter.    Physical Findings: The patient is in no acute distress. Patient is alert and oriented. Wt Readings from Last 3 Encounters:  01/16/21 113 lb 8 oz (51.5 kg)  01/11/21 115 lb 3.2 oz (52.3 kg)  12/14/20 113 lb (51.3 kg)    height is 5\' 5"  (1.651 m) and weight is 113 lb 8 oz (51.5 kg). Her temporal temperature is 97.3 F (36.3 C) (abnormal). Her blood pressure is 104/68 and her pulse is 78. Her  respiration is 18 and oxygen saturation is 96%. .  General: Alert and oriented, in no acute distress, hoarse HEENT: Head is normocephalic. Extraocular movements are intact. Oropharynx is notable for dry mouth, no thrush or tumor visible in mouth or upper throat Neck: Neck - no masses to palpation Skin: Skin in treatment fields shows satisfactory healing  MSK ambulatory  Lab Findings: Lab Results  Component Value Date   WBC 9.6 10/10/2020   HGB 11.6 (L) 10/10/2020   HCT 35.8 (L) 10/10/2020   MCV 98.4 10/10/2020   PLT 134 (L) 10/10/2020    Lab Results  Component Value Date   TSH 2.220 01/07/2020   CMP     Component Value Date/Time   NA 139 10/10/2020 1545   NA 139 06/02/2020 0845   K 4.5 10/10/2020 1545   CL 103 10/10/2020 1545   CO2 26 10/10/2020 1545   GLUCOSE 109 (H) 10/10/2020 1545   BUN 16 10/10/2020 1545   BUN 12 06/02/2020 0845   CREATININE 0.75 10/10/2020 1545   CREATININE 0.68 09/25/2020 1607   CREATININE 0.70 02/26/2016 1143   CALCIUM 9.5 10/10/2020 1545   PROT 5.1 (L) 09/16/2020 0310   PROT 6.6 06/02/2020 0845   ALBUMIN 2.2 (L) 09/16/2020 0310   ALBUMIN 3.5 (L) 06/02/2020 0845   AST 23 09/16/2020 0310   ALT 15 09/16/2020 0310   ALKPHOS 39 09/16/2020 0310   BILITOT 0.6 09/16/2020 0310   BILITOT 0.5 06/02/2020 0845   GFRNONAA >60 10/10/2020 1545   GFRNONAA >60 09/25/2020 1607   GFRNONAA >89 08/23/2014 1506   GFRAA 74 06/02/2020 0845   GFRAA >89 08/23/2014 1506    Radiographic Findings: NM PET Image Initial (PI) Skull Base To Thigh  Result Date: 01/13/2021 CLINICAL DATA:  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 EXAM: NUCLEAR MEDICINE PET SKULL BASE TO THIGH TECHNIQUE: 5.7 mCi F-18 FDG was injected intravenously. Full-ring PET imaging was performed from the skull base to thigh after the radiotracer. CT data was obtained and used for attenuation correction and anatomic localization. Fasting blood glucose: 106 mg/dl  COMPARISON:  PET-CT 06/28/2020 FINDINGS: Mediastinal blood pool activity: SUV max 1.9 Liver activity: SUV max NA NECK: Interval resolution of the hypermetabolic activity in the supraglottic tissue. Mild residual activity at the level the arytenoid cartilage is favored physiologic. Mild activity at the anterior commissure is also favored physiologic. Resolution of the enlarged hypermetabolic RIGHT level 2 lymph node. Lymph node now normal size at 5 mm (image 31) reduced from 14 mm with no measurable metabolic activity. No enlarged or hypermetabolic cervical lymph nodes within the LEFT or RIGHT neck. No hypermetabolic supraclavicular nodes. Incidental CT findings: none CHEST: No hypermetabolic mediastinal or hilar nodes. No suspicious pulmonary nodules on the CT scan. Incidental CT findings: Port in the anterior chest wall with tip in distal SVC. Centrilobular emphysema  the upper lobes. Paraseptal emphysema in lower lobes. ABDOMEN/PELVIS: No abnormal hypermetabolic activity within the liver, pancreas, adrenal glands, or spleen. No hypermetabolic lymph nodes in the abdomen or pelvis. Incidental CT findings: Percutaneous gastrostomy tube with retention bulb in the stomach. Atherosclerotic calcification of the aorta. SKELETON: No focal hypermetabolic activity to suggest skeletal metastasis. Incidental CT findings: none IMPRESSION: 1. No residual hypermetabolic supraglottic tissue. 2. Hypermetabolic activity in the larynx is favored physiologic. 3. No residual hypermetabolic or enlarged cervical lymph nodes. 4. No evidence distant metastatic disease. 5. Severe centrilobular emphysema. Coronary artery calcification and Aortic Atherosclerosis (ICD10-I70.0). 6. Port and PEG tube in good position Electronically Signed   By: Suzy Bouchard M.D.   On: 01/13/2021 09:46    Impression/Plan:    1) Head and Neck Cancer Status:  NED - I personally reviewed her PET images and shared these results with her  2) Nutritional  Status: still using PEG for supplementation  Wt Readings from Last 3 Encounters:  01/16/21 113 lb 8 oz (51.5 kg)  01/11/21 115 lb 3.2 oz (52.3 kg)  12/14/20 113 lb (51.3 kg)    3) Risk Factors: The patient has been educated about risk factors including alcohol and tobacco abuse; they understand that avoidance of alcohol and tobacco is important to prevent recurrences as well as other cancers  She is still smoking - does not seem motivated to quit but urged to do so.  4) Swallowing: follow SLP recommendations  5) Dental: edentulous  6) Thyroid function:  Check annually - will order labs prior to next visit w/ med onc Lab Results  Component Value Date   TSH 2.220 01/07/2020    7) Other: f/u with Raven Chryl Heck in July and continue regular laryngoscopy w/ ENT.  She will continue intermittent visits with med onc until I see her in 12 mo.  On date of service, in total, I spent 15 minutes on this encounter. Patient was seen in person. _____________________________________   Raven Gibson, MD

## 2021-01-19 ENCOUNTER — Other Ambulatory Visit: Payer: Self-pay

## 2021-01-19 DIAGNOSIS — C321 Malignant neoplasm of supraglottis: Secondary | ICD-10-CM

## 2021-01-19 DIAGNOSIS — Z1329 Encounter for screening for other suspected endocrine disorder: Secondary | ICD-10-CM

## 2021-01-23 DIAGNOSIS — J441 Chronic obstructive pulmonary disease with (acute) exacerbation: Secondary | ICD-10-CM | POA: Diagnosis not present

## 2021-02-03 ENCOUNTER — Other Ambulatory Visit: Payer: Self-pay | Admitting: Internal Medicine

## 2021-02-03 DIAGNOSIS — I7090 Unspecified atherosclerosis: Secondary | ICD-10-CM

## 2021-02-07 ENCOUNTER — Encounter: Payer: Medicare Other | Admitting: Nutrition

## 2021-02-07 ENCOUNTER — Telehealth: Payer: Self-pay | Admitting: Nutrition

## 2021-02-07 NOTE — Telephone Encounter (Signed)
Telephone follow up completed with patient s/p treatment for Larynx cancer.  Patient completed treatment on February 21. Last weight documented as 113 pounds on June 14.  She is using 3 boost plus daily through feeding tube.  She told me previously that she uses her feeding tube because she did not like the taste of boost plus.  Today she is willing to try to drink it if she could get her feeding tube taken out. Reports she eats very well and can eat any foods that she wants.  She gravitates towards healthier foods especially lots of vegetables.  States she does eat chicken and fish.  The only thing she does not tolerate are foods that are extremely salty. Reports she has 1 bowel movement a day. She thinks her tube site looks infected and has been putting Neosporin on it as directed by nurse navigator.  Nutrition diagnosis: Inadequate oral energy intake improved.  Intervention: Recommended patient drink 3 cartons of boost plus daily in addition to high-calorie, high-protein foods. Patient will be weighed tomorrow when she comes in for her MD visit.  Monitoring, evaluation, goals: Patient will continue high-calorie, high-protein foods and drinks to promote weight gain and continued healing.  Continue to work to increase oral intake so feeding tube can be removed.  Telephone follow-up to be scheduled as needed.  **Disclaimer: This note was dictated with voice recognition software. Similar sounding words can inadvertently be transcribed and this note may contain transcription errors which may not have been corrected upon publication of note.**

## 2021-02-08 ENCOUNTER — Ambulatory Visit
Admission: RE | Admit: 2021-02-08 | Discharge: 2021-02-08 | Disposition: A | Discharge status: Home / Self Care | Payer: Medicare Other | Source: Ambulatory Visit | Attending: Hematology and Oncology | Admitting: Hematology and Oncology

## 2021-02-08 ENCOUNTER — Other Ambulatory Visit: Payer: Self-pay

## 2021-02-08 ENCOUNTER — Ambulatory Visit: Payer: Medicare Other | Attending: Radiation Oncology

## 2021-02-08 ENCOUNTER — Encounter: Payer: Self-pay | Admitting: Hematology and Oncology

## 2021-02-08 ENCOUNTER — Inpatient Hospital Stay: Payer: Medicare Other | Attending: Hematology and Oncology | Admitting: Hematology and Oncology

## 2021-02-08 VITALS — BP 156/60 | HR 82 | Temp 98.3°F | Resp 18 | Ht 65.0 in | Wt 113.8 lb

## 2021-02-08 DIAGNOSIS — F17210 Nicotine dependence, cigarettes, uncomplicated: Secondary | ICD-10-CM | POA: Insufficient documentation

## 2021-02-08 DIAGNOSIS — Z1329 Encounter for screening for other suspected endocrine disorder: Secondary | ICD-10-CM

## 2021-02-08 DIAGNOSIS — C329 Malignant neoplasm of larynx, unspecified: Secondary | ICD-10-CM

## 2021-02-08 DIAGNOSIS — Z79899 Other long term (current) drug therapy: Secondary | ICD-10-CM | POA: Diagnosis not present

## 2021-02-08 DIAGNOSIS — Z931 Gastrostomy status: Secondary | ICD-10-CM | POA: Insufficient documentation

## 2021-02-08 DIAGNOSIS — C321 Malignant neoplasm of supraglottis: Secondary | ICD-10-CM | POA: Insufficient documentation

## 2021-02-08 DIAGNOSIS — R634 Abnormal weight loss: Secondary | ICD-10-CM | POA: Diagnosis not present

## 2021-02-08 DIAGNOSIS — R1310 Dysphagia, unspecified: Secondary | ICD-10-CM | POA: Insufficient documentation

## 2021-02-08 LAB — CMP (CANCER CENTER ONLY)
ALT: 12 U/L (ref 0–44)
AST: 27 U/L (ref 15–41)
Albumin: 3.4 g/dL — ABNORMAL LOW (ref 3.5–5.0)
Alkaline Phosphatase: 65 U/L (ref 38–126)
Anion gap: 8 (ref 5–15)
BUN: 16 mg/dL (ref 8–23)
CO2: 30 mmol/L (ref 22–32)
Calcium: 9.4 mg/dL (ref 8.9–10.3)
Chloride: 102 mmol/L (ref 98–111)
Creatinine: 0.78 mg/dL (ref 0.44–1.00)
GFR, Estimated: >60 mL/min (ref >60)
Glucose, Bld: 99 mg/dL (ref 70–99)
Potassium: 4.7 mmol/L (ref 3.5–5.1)
Sodium: 140 mmol/L (ref 135–145)
Total Bilirubin: 0.4 mg/dL (ref 0.3–1.2)
Total Protein: 6.8 g/dL (ref 6.5–8.1)

## 2021-02-08 LAB — CBC WITH DIFFERENTIAL/PLATELET
Abs Immature Granulocytes: 0.01 10*3/uL (ref 0.00–0.07)
Basophils Absolute: 0 10*3/uL (ref 0.0–0.1)
Basophils Relative: 0 %
Eosinophils Absolute: 0.2 10*3/uL (ref 0.0–0.5)
Eosinophils Relative: 3 %
HCT: 37.6 % (ref 36.0–46.0)
Hemoglobin: 12.4 g/dL (ref 12.0–15.0)
Immature Granulocytes: 0 %
Lymphocytes Relative: 21 %
Lymphs Abs: 1.1 10*3/uL (ref 0.7–4.0)
MCH: 31.2 pg (ref 26.0–34.0)
MCHC: 33 g/dL (ref 30.0–36.0)
MCV: 94.7 fL (ref 80.0–100.0)
Monocytes Absolute: 0.7 10*3/uL (ref 0.1–1.0)
Monocytes Relative: 13 %
Neutro Abs: 3.3 10*3/uL (ref 1.7–7.7)
Neutrophils Relative %: 63 %
Platelets: 93 10*3/uL — ABNORMAL LOW (ref 150–400)
RBC: 3.97 MIL/uL (ref 3.87–5.11)
RDW: 13.8 % (ref 11.5–15.5)
WBC: 5.3 10*3/uL (ref 4.0–10.5)
nRBC: 0 % (ref 0.0–0.2)

## 2021-02-08 LAB — TSH: TSH: 1.996 u[IU]/mL (ref 0.308–3.960)

## 2021-02-08 LAB — MAGNESIUM: Magnesium: 1.9 mg/dL (ref 1.7–2.4)

## 2021-02-08 NOTE — Therapy (Signed)
[facility] [address] [place], [place], [address] Phone: [phone]   Fax:  [phone]  Speech Language Pathology Treatment.Discharge summary  Patient Details  Name: Raven Torres MRN: [id number] Date of Birth: April 21, 1956 Referring Provider (SLP): Eppie Gibson, MD   Encounter Date: 02/08/2021   End of Session - 02/08/21 1449     Visit Number 5    Number of Visits 7    Date for SLP Re-Evaluation 03/01/21    SLP Start Time 29    SLP Stop Time  1430    SLP Time Calculation (min) 25 min    Activity Tolerance Patient tolerated treatment well             Past Medical History:  Diagnosis Date   Arthritis    Cataract    removed both eyes    Colitis    COPD (chronic obstructive pulmonary disease) ([place])    Hepatitis C    Hyperlipidemia    Hypertension    Shingles 03/27/2020   TB (tuberculosis)    treatment     Past Surgical History:  Procedure Laterality Date   ABDOMINAL HYSTERECTOMY     CATARACT EXTRACTION, BILATERAL     COLONOSCOPY     COLONOSCOPY  04/04/2020   DIRECT LARYNGOSCOPY N/A 06/26/2020   Procedure: DIRECT LARYNGOSCOPY AND BIOPSY;  Surgeon: Rozetta Nunnery, MD;  Location: [facility];  Service: ENT;  Laterality: N/A;   IR GASTROSTOMY TUBE MOD SED  07/27/2020   IR IMAGING GUIDED PORT INSERTION  07/27/2020   KNEE SURGERY Right    POLYPECTOMY  11/17/2008   HPP x 1     There were no vitals filed for this visit.   SPEECH THERAPY DISCHARGE SUMMARY  Visits from Start of Care: 5  Current functional level related to goals / functional outcomes: See goals below. Pt continues with suboptimal completion of HEP and WNL swallowing function. She was told if she has difficulties with her swallowing she should contact one of her MDs.   Remaining deficits: None noted   Education / Equipment: HEP procedure (almost every session), late effects head/neck radiation, need to complete HEP at  least x2/day 6 days/week.   Patient agrees to discharge. Patient goals were partially met. Patient is being discharged due to maximized rehab potential. .      Subjective Assessment - 02/08/21 1405     Subjective "I still have trouble gaining weight."   "I eat everything." Pt mod-severely hoarse today.    Currently in Pain? No/denies                   ADULT SLP TREATMENT - 02/08/21 1406       General Information   Behavior/Cognition Alert;Pleasant mood;Cooperative      Treatment Provided   Treatment provided Dysphagia      Dysphagia Treatment   Temperature Spikes Noted No    Treatment Methods Therapeutic exercise;Patient/caregiver education;Skilled observation    Patient observed directly with PO's Yes    Type of PO's observed Dysphagia 3 (soft);Thin liquids    Liquids provided via Cup    Oral Phase Signs & Symptoms Prolonged mastication    Pharyngeal Phase Signs & Symptoms Other (comment)   None noted   Other treatment/comments Pt is doing 3 cans tube feed/day - SLP encouraged pt to reduce her tube feeds and to incr PO "That's what Barb told me - drink the Boost.". Suggest she contact dietician if she has more questions  about this. Pt req'd SLP mod cues consistently for HEP procedure. She cont to perform HEP very minimally - despite encouragement from SLP every session.      Assessment / Recommendations / Plan   Plan Discharge SLP treatment due to (comment)   pt remains WNL swallow safety, completion of HEP has consistently been suboptimal despite SLP encouragement, consistently     Dysphagia Recommendations   Diet recommendations Dysphagia 3 (mechanical soft)    Liquids provided via Cup    Medication Administration --   as tolerated     Progression Toward Goals   Progression toward goals Not progressing toward goals (comment)              SLP Education - 02/08/21 1448     Education Details HEP procedure, if cough, choking increases in frequency  call/contact one of her MDs to inform him/her    Person(s) Educated Patient    Methods Explanation;Demonstration;Verbal cues    Comprehension Verbalized understanding;Returned demonstration;Verbal cues required              SLP Short Term Goals - 11/23/20 1355       SLP SHORT TERM GOAL #1   Title pt will complete HEP with rare min A  in 2 sessions    Period --   sessions, for all STGs   Status Not Met      SLP SHORT TERM GOAL #2   Title pt will tell SLP why pt is completing HEP with rare min A, then be able to repeat to SLP after 15 minutes with modified independence    Status Achieved      SLP SHORT TERM GOAL #3   Title pt will describe 3 overt s/s aspiration PNA with modified independence    Status Deferred   added to [place] #4   Title pt will tell SLP how a food journal could hasten return to a more normalized diet    Status Deferred   pt eating satisfactory diet currently without using food journal             SLP Long Term Goals - 02/08/21 [place] #1   Title pt will complete HEP with rare min A over two visits    Period --   visits, for all LTGs   Status Not Met      SLP LONG TERM GOAL #2   Title pt will describe how to modify HEP over time, and the timeline associated with reduction in HEP frequency using written cues    Status Achieved   pt looking at top of HEP     SLP LONG TERM GOAL #3   Title pt will tell SLP 3 overt s/sx of aspiration PNA with modified independent    Status Achieved              Plan - 02/08/21 1449     Clinical Impression Statement At this time pt swallowing is deemed WNL/WFL with dys III items (ham sandwich) and thin liquids. SLP reviewed pt's completion of her individualized HEP - Ardice continues to complete HEP with suboptimal frequency. There are no overt s/s aspiration with POs reported by pt at this time and pt today had no overt s/s aspiration with POs. Data indicate that pt's  swallow ability could very well decline over time following conclusion of their radiation therapy due to muscle disuse atrophy and/or  muscle fibrosis. Today, she continues with suboptimal frequency and scope of HEP and swallowing continues to look WNL/WFL, so she will be d/c'd. She agrees with this.    Treatment/Interventions Aspiration precaution training;Pharyngeal strengthening exercises;Diet toleration management by SLP;Trials of upgraded texture/liquids;Patient/family education;SLP instruction and feedback;Compensatory strategies    Potential to Achieve Goals Good    Potential Considerations Ability to learn/carryover information   pt reports she has memory difficulties   SLP Home Exercise Plan provided today    Consulted and Agree with Plan of Care Patient             Patient will benefit from skilled therapeutic intervention in order to improve the following deficits and impairments:   Dysphagia, unspecified type    Problem List Patient Active Problem List   Diagnosis Date Noted   Protein-calorie malnutrition, severe 09/13/2020   Hypotension 09/12/2020   Tobacco dependence 08/11/2020   Malignant neoplasm of supraglottis ([place]) 07/13/2020   Herpes zoster without complication 17/51/0258   GOLD COPD III B 04/23/2018   Pruritic dermatitis 04/23/2018   High cholesterol 06/24/2017   Shortness of breath 06/04/2017   Coronary artery calcification 06/04/2017   Other fatigue 06/04/2017   Centrilobular emphysema ([place]) 05/15/2017   Atherosclerosis of arteries 05/15/2017   Poor memory 05/15/2017   TB lung, latent 04/16/2017   Lung nodules 04/16/2017   Diabetes mellitus screening 11/19/2016   Fibromyalgia 05/15/2015   Rheumatoid arthritis ([place]) 08/22/2014   HTN (hypertension) 08/22/2014   Tobacco abuse 08/22/2014   Hepatic cirrhosis ([place]) 05/31/2014   Chronic hepatitis C virus infection ([place]) 05/03/2014    [place] ,[place], [place]  02/08/2021, 2:51 PM  [facility] [address] [place], [place], [address] Phone: [phone]   Fax:  [phone]   Name: Raven Torres MRN: [id number] Date of Birth: 08/07/1955

## 2021-02-08 NOTE — Progress Notes (Signed)
[facility] CONSULT NOTE  Patient Care Team: Ladell Pier, MD as PCP - General (Internal Medicine) Debara Pickett Nadean Corwin, MD as PCP - Cardiology (Cardiology) Eppie Gibson, MD as Consulting Physician (Radiation Oncology) Benay Pike, MD as Consulting Physician (Hematology and Oncology) Malmfelt, Stephani Police, RN as Oncology Nurse Navigator  CHIEF COMPLAINTS/PURPOSE OF CONSULTATION:   Supraglottic cancer   ASSESSMENT & PLAN:   This is a very pleasant 65 year old female patient with past medical history significant for hypertension, rheumatoid arthritis, hepatitis C induced cirrhosis  s/p chemo radiation with weekly cisplatin for 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 She is here for follow up after completion of radiation. No clinical evidence for residual or recurrent disease. End of treatment PET/CT with complete response, no concern for residual disease.  2 Weight loss or inability to gain, likely from chemoradiation She says she has been eating well but she has a problem gaining weight. She at least manages to consume boost worth about 1000 kcal.  She also eats by mouth and tells me that she needs voraciously and yet has difficulty gaining weight. TSH is normal at 1.996, no concern for hyperthyroidism.  I have sent a message to her nutritionist for additional counseling regarding calorie requirement for weight gain.  We have also discussed that she at least needs 2000 kcal a day to maintain and gain some weight.  3. Encouraged smoking cessation.   Thank you for consulting [place] in the care of this patient.  Please do not hesitate to contact us with any additional questions or concerns.  HISTORY OF PRESENTING ILLNESS:   Raven Torres 65 y.o. female is here because of new diagnosis of laryngeal cancer  Oncology History Overview Note   Raven Torres is a 65 y.o. female who presents for evaluation of weight loss and enlarged right  neck node.  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 She has had a approximate 30 pound weight loss over the past 4 months.  She is having no airway problems or hoarseness.Raven Torres is a 65 y.o. female who presents for evaluation of weight loss and enlarged right neck node. She has had a approximate 30 pound weight loss over the past 4 months.  She has no hoarseness or airway problems. On fiberoptic laryngoscopy she has abnormality of the laryngeal surface of the epiglottis and is taken to the operating room for direct laryngoscopy and biopsy.  06/16/2020 2.0 x 3.2 x 2.5 cm supraglottic laryngeal mass involving the epiglottis and extending along the aryepiglottic folds bilaterally. Tumor extends into the pre-epiglottic fat and likely into the vallecula. Tumor extends into the upper thyroid cartilage bilaterally, abutting and possibly extending through the outer cortex (particularly on the left). This almost certainly reflects a supraglottic laryngeal squamous cell carcinoma  06/26/2020, she had laryngoscopy which showed there was a friable ulcerative tumor involving predominantly the  supraglottic area above the vocal cords as the vocal cords were clear to evaluation.  This extended up the laryngeal surface of epiglottis, more on the right side. A direct laryngoscopy was also performed  of the piriform sinuses which were clear bilaterally.  06/28/2020  1. The supraglottic mass has a maximum SUV of 12.5 and the enlarged right level II lymph node has a maximum SUV of 16.9, both compatible with malignancy. No  findings of metastatic disease to the chest, abdomen, pelvis, or regional skeleton. 2. New lingular ground-glass opacity compared to 01/20/2020 with very low-grade activity, probably from alveolitis or low-grade atypical  infection. 3. Aortic Atherosclerosis (ICD10-I70.0) and Emphysema (ICD10-J43.9). 4. Airway thickening is present, suggesting bronchitis or reactive airways disease. 5. 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 6.  Prominent stool throughout the colon favors constipation.  Her case was discussed in the tumor board, and radiology review suggested that there is no definitive evidence of T4 involvement, hence plan was to consider concurrent chemoradiation and thus medical oncology referral.  She completed 5 weekly cycles of cisplatin 08/2020.    Malignant neoplasm of supraglottis ([place])  07/10/2020 Cancer Staging   Staging form: Larynx - Supraglottis, AJCC 8th Edition - Clinical stage from 07/10/2020: Stage IVA (cT3, cN2a, cM0) - Signed by Eppie Gibson, MD on 07/14/2020   07/13/2020 Initial Diagnosis   Laryngeal cancer ([place])   07/25/2020 -  Chemotherapy    Patient is on Treatment Plan: HEAD/NECK CISPLATIN Q7D       Interval history  She is here for FU by herself. She says she is very frustrated that she cannot gain any weight despite eating well.  She continues to have hoarseness since treatment.  No difficulty swallowing or odynophagia.  She has been putting in at least 3 boost every day via G-tube. No change in breathing, bowel habits or urinary habits.  No new neurological complaints.  Rest of the pertinent 10 point ROS reviewed and negative  MEDICAL HISTORY:  Past Medical History:  Diagnosis Date   Arthritis    Cataract    removed both eyes    Colitis    COPD (chronic obstructive pulmonary disease) ([place])    Hepatitis C    Hyperlipidemia    Hypertension    Shingles 03/27/2020   TB (tuberculosis)    treatment     SURGICAL HISTORY: Past Surgical History:  Procedure Laterality Date   ABDOMINAL HYSTERECTOMY     CATARACT EXTRACTION, BILATERAL     COLONOSCOPY     COLONOSCOPY  04/04/2020   DIRECT  LARYNGOSCOPY N/A 06/26/2020   Procedure: DIRECT LARYNGOSCOPY AND BIOPSY;  Surgeon: Rozetta Nunnery, MD;  Location: [facility] OR;  Service: ENT;  Laterality: N/A;   IR GASTROSTOMY TUBE MOD SED  07/27/2020   IR IMAGING GUIDED PORT INSERTION  07/27/2020   KNEE SURGERY Right    POLYPECTOMY  11/17/2008   HPP x 1     SOCIAL HISTORY: Social History   Socioeconomic History   Marital status: Married    Spouse name: Not on file   Number of children: Not on file   Years of education: Not on file   Highest education level: Not on file  Occupational History   Not on file  Tobacco Use   Smoking status: Every Day    Packs/day: 1.00    Years: 51.00    Pack years: 51.00    Types: Cigarettes    Start date: 59   Smokeless tobacco: Never   Tobacco comments:    4 cigs a day   Vaping Use   Vaping Use: Never used  Substance and Sexual Activity   Alcohol use: No    Alcohol/week: 0.0 standard drinks   Drug use: No   Sexual activity: Not Currently  Other Topics Concern   Not on file  Social History Narrative   Not on file   Social Determinants of Health  Financial Resource Strain: Not on file  Food Insecurity: No Food Insecurity   Worried About Charity fundraiser in the Last Year: Never true   Arboriculturist in the Last Year: Never true  Transportation Needs: No Transportation Needs   Lack of Transportation (Medical): No   Lack of Transportation (Non-Medical): No  Physical Activity: Not on file  Stress: Not on file  Social Connections: Not on file  Intimate Partner Violence: Not on file    FAMILY HISTORY: Family History  Problem Relation Age of Onset   Heart disease Mother    Cancer Mother    Melanoma Mother    Esophageal cancer Father    Colon cancer Neg Hx    Colon polyps Neg Hx    Rectal cancer Neg Hx    Stomach cancer Neg Hx     ALLERGIES:  is allergic to [place] pressair [aclidinium bromide], amitriptyline, codeine, and tramadol.  MEDICATIONS:  Current  Outpatient Medications  Medication Sig Dispense Refill   albuterol (PROVENTIL) (2.5 MG/3ML) 0.083% nebulizer solution Take 3 mLs (2.5 mg total) by nebulization every 6 (six) hours as needed for wheezing or shortness of breath. 150 mL 1   albuterol (VENTOLIN HFA) 108 (90 Base) MCG/ACT inhaler INHALE 2 PUFFS INTO THE LUNGS EVERY 6 HOURS AS NEEDED FOR WHEEZING OR SHORTNESS OF BREATH (Patient taking differently: Inhale 2 puffs into the lungs every 6 (six) hours as needed for wheezing or shortness of breath.) 8.5 g 3   BEVESPI AEROSPHERE 9-4.8 MCG/ACT AERO INHALE 2 PUFFS INTO THE LUNGS TWICE DAILY (Patient taking differently: Inhale 2 puffs into the lungs in the morning and at bedtime.) 10.7 g 1   chlorhexidine (PERIDEX) 0.12 % solution Use as directed 15 mLs in the mouth or throat 2 (two) times daily. 120 mL 0   lactose free nutrition (BOOST) LIQD Take 237 mLs by mouth 3 (three) times daily between meals.     lidocaine (XYLOCAINE) 2 % solution Patient: Mix 1part 2% viscous lidocaine, 1part H20. Swallow 9mL of diluted mixture, 10min before meals and at bedtime, up to QID (Patient taking differently: Use as directed 15 mLs in the mouth or throat in the morning, at noon, in the evening, and at bedtime. Patient: Mix 1part 2% viscous lidocaine, 1part H20. Swallow 37mL of diluted mixture, 78min before meals and at bedtime, up to QID) 200 mL 4   loperamide (IMODIUM) 2 MG capsule Take 1 capsule (2 mg total) by mouth as needed for diarrhea or loose stools. 30 capsule 0   No current facility-administered medications for this visit.    PHYSICAL EXAMINATION: ECOG PERFORMANCE STATUS: 0 - Asymptomatic  Vitals:   02/08/21 1218  BP: (!) 156/60  Pulse: 82  Resp: 18  Temp: 98.3 F (36.8 C)  SpO2: 97%   Filed Weights   02/08/21 1218  Weight: 113 lb 12.8 oz (51.6 kg)    Physical Exam Constitutional:      Appearance: Normal appearance.  HENT:     Head: Normocephalic and atraumatic.     Mouth/Throat:      Pharynx: No oropharyngeal exudate or posterior oropharyngeal erythema.  Eyes:     Extraocular Movements: Extraocular movements intact.     Pupils: Pupils are equal, round, and reactive to light.  Cardiovascular:     Rate and Rhythm: Normal rate and regular rhythm.     Pulses: Normal pulses.     Heart sounds: Normal heart sounds.  Pulmonary:     Effort:  Pulmonary effort is normal.     Breath sounds: Normal breath sounds.  Abdominal:     General: Abdomen is flat. There is no distension.     Palpations: Abdomen is soft. There is no mass.     Tenderness: There is no abdominal tenderness.     Comments: PEG site with no clear evidence of infection.  There certainly appears to be skin irritation at the site of contact of the G-tube.    Musculoskeletal:        General: No swelling, tenderness or signs of injury. Normal range of motion.     Cervical back: Normal range of motion and neck supple. Tenderness: rad changes, no ulceration.     Left lower leg: No edema.  Lymphadenopathy:     Cervical: No cervical adenopathy (No palpable cervical lymphadenopathy).  Skin:    General: Skin is warm and dry.  Neurological:     General: No focal deficit present.     Mental Status: She is alert.  Psychiatric:        Mood and Affect: Mood normal.      LABORATORY DATA:  I have reviewed the data as listed Lab Results  Component Value Date   WBC 5.3 02/08/2021   HGB 12.4 02/08/2021   HCT 37.6 02/08/2021   MCV 94.7 02/08/2021   PLT 93 (L) 02/08/2021     Chemistry      Component Value Date/Time   NA 139 10/10/2020 1545   NA 139 06/02/2020 0845   K 4.5 10/10/2020 1545   CL 103 10/10/2020 1545   CO2 26 10/10/2020 1545   BUN 16 10/10/2020 1545   BUN 12 06/02/2020 0845   CREATININE 0.75 10/10/2020 1545   CREATININE 0.68 09/25/2020 1607   CREATININE 0.70 02/26/2016 1143      Component Value Date/Time   CALCIUM 9.5 10/10/2020 1545   ALKPHOS 39 09/16/2020 0310   AST 23 09/16/2020 0310   ALT  15 09/16/2020 0310   BILITOT 0.6 09/16/2020 0310   BILITOT 0.5 06/02/2020 0845      RADIOGRAPHIC STUDIES: I have personally reviewed the radiological images as listed and agreed with the findings in the report. NM PET Image Initial (PI) Skull Base To Thigh  Result Date: 01/13/2021 CLINICAL DATA:  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 EXAM: NUCLEAR MEDICINE PET SKULL BASE TO THIGH TECHNIQUE: 5.7 mCi F-18 FDG was injected intravenously. Full-ring PET imaging was performed from the skull base to thigh after the radiotracer. CT data was obtained and used for attenuation correction and anatomic localization. Fasting blood glucose: 106 mg/dl COMPARISON:  PET-CT 06/28/2020 FINDINGS: Mediastinal blood pool activity: SUV max 1.9 Liver activity: SUV max NA NECK: Interval resolution of the hypermetabolic activity in the supraglottic tissue. Mild residual activity at the level the arytenoid cartilage is favored physiologic. Mild activity at the anterior commissure is also favored physiologic. Resolution of the enlarged hypermetabolic RIGHT level 2 lymph node. Lymph node now normal size at 5 mm (image 31) reduced from 14 mm with no measurable metabolic activity. No enlarged or hypermetabolic cervical lymph nodes within the LEFT or RIGHT neck. No hypermetabolic supraclavicular nodes. Incidental CT findings: none CHEST: No hypermetabolic mediastinal or hilar nodes. No suspicious pulmonary nodules on the CT scan. Incidental CT findings: Port in the anterior chest wall with tip in distal SVC. Centrilobular emphysema the upper lobes. Paraseptal emphysema in lower lobes. ABDOMEN/PELVIS: No abnormal hypermetabolic activity within the liver, pancreas, adrenal glands, or  spleen. No hypermetabolic lymph nodes in the abdomen or pelvis. Incidental CT findings: Percutaneous gastrostomy tube with retention bulb in the stomach. Atherosclerotic calcification of the aorta. SKELETON: No focal  hypermetabolic activity to suggest skeletal metastasis. Incidental CT findings: none IMPRESSION: 1. No residual hypermetabolic supraglottic tissue. 2. Hypermetabolic activity in the larynx is favored physiologic. 3. No residual hypermetabolic or enlarged cervical lymph nodes. 4. No evidence distant metastatic disease. 5. Severe centrilobular emphysema. Coronary artery calcification and Aortic Atherosclerosis (ICD10-I70.0). 6. Port and PEG tube in good position Electronically Signed   By: Suzy Bouchard M.D.   On: 01/13/2021 09:46     All questions were answered. The patient knows to call the clinic with any problems, questions or concerns.    Benay Pike, MD 02/08/2021 12:39 PM

## 2021-02-19 ENCOUNTER — Other Ambulatory Visit: Payer: Self-pay

## 2021-02-19 DIAGNOSIS — C321 Malignant neoplasm of supraglottis: Secondary | ICD-10-CM

## 2021-02-19 NOTE — Progress Notes (Signed)
i

## 2021-02-22 DIAGNOSIS — J441 Chronic obstructive pulmonary disease with (acute) exacerbation: Secondary | ICD-10-CM | POA: Diagnosis not present

## 2021-02-28 ENCOUNTER — Ambulatory Visit: Payer: Self-pay | Admitting: *Deleted

## 2021-02-28 NOTE — Telephone Encounter (Signed)
Pt reports urinary frequency, urgency and dysuria x 4 days. States had been taking AZO and cranberry tabs x 2 days. Also report bladder pressure. Denies fever, no visible blood in urine, denies back, flank pain. States "Probably because of these cancer treatments." Pt called initially requesting antibiotics be called in. Advised may need to be seen. Pt states "Dr. Wynetta Emery knows my history." States can in come to leave specimen. No availability within protocol timeframe. Assured pt NT would route to practice for PCPs review and final disposition. Advised ED/UC for worsening symptoms. Pt verbalizes understanding.   Please advise: [phone]

## 2021-02-28 NOTE — Telephone Encounter (Signed)
Per agent: " Patient called in asking for antibiotics,didn't want appt. Says has pressure with frequent urination." Please call back .  Pt reports urinary frequency, urgency, and dysuria x    Reason for Disposition  Urinating more frequently than usual (i.e., frequency)  Answer Assessment - Initial Assessment Questions 1. SYMPTOM: "What's the main symptom you're concerned about?" (e.g., frequency, incontinence)     Pain with urination, pressure at bladder 2. ONSET: "When did the  *No Answer*  start?"     4 days ago 3. PAIN: "Is there any pain?" If Yes, ask: "How bad is it?" (Scale: 1-10; mild, moderate, severe)     varies 4. CAUSE: "What do you think is causing the symptoms?"     Bladder infection 5. OTHER SYMPTOMS: "Do you have any other symptoms?" (e.g., fever, flank pain, blood in urine, pain with urination)     Frequency, urgency, pressure at bladder  Protocols used: Urinary Symptoms-A-AH

## 2021-03-01 ENCOUNTER — Other Ambulatory Visit: Payer: Self-pay

## 2021-03-01 ENCOUNTER — Other Ambulatory Visit: Payer: Self-pay | Admitting: Internal Medicine

## 2021-03-01 ENCOUNTER — Encounter: Payer: Self-pay | Admitting: Internal Medicine

## 2021-03-01 DIAGNOSIS — R300 Dysuria: Secondary | ICD-10-CM

## 2021-03-01 LAB — POCT URINALYSIS DIP (CLINITEK)
Bilirubin, UA: NEGATIVE
Blood, UA: NEGATIVE
Glucose, UA: NEGATIVE mg/dL
Ketones, POC UA: NEGATIVE mg/dL
Nitrite, UA: NEGATIVE
POC PROTEIN,UA: NEGATIVE
Spec Grav, UA: 1.02 (ref 1.010–1.025)
Urobilinogen, UA: 0.2 E.U./dL
pH, UA: 6.5 (ref 5.0–8.0)

## 2021-03-01 MED ORDER — CIPROFLOXACIN HCL 500 MG PO TABS: 500.0000 mg | ORAL_TABLET | Freq: Two times a day (BID) | ORAL | 0 refills | Status: DC

## 2021-03-01 MED ORDER — CIPROFLOXACIN HCL 500 MG PO TABS: 500.0000 mg | ORAL_TABLET | Freq: Two times a day (BID) | ORAL | 0 refills | Status: AC

## 2021-03-01 NOTE — Telephone Encounter (Signed)
Contacted pt and went over provider response pt is aware and doesn't have any questions or concerns  

## 2021-03-01 NOTE — Addendum Note (Signed)
Addended by: Karle Plumber B on: 03/01/2021 02:00 PM   Modules accepted: Orders

## 2021-03-01 NOTE — Telephone Encounter (Signed)
I have done a poct ua and have put the results in chart. Please follow up

## 2021-03-01 NOTE — Telephone Encounter (Signed)
Will place orders

## 2021-03-05 ENCOUNTER — Other Ambulatory Visit: Payer: Self-pay | Admitting: Internal Medicine

## 2021-03-05 LAB — URINE CULTURE

## 2021-03-05 MED ORDER — CIPROFLOXACIN HCL 500 MG PO TABS: 500.0000 mg | ORAL_TABLET | Freq: Two times a day (BID) | ORAL | 0 refills | Status: AC

## 2021-03-06 ENCOUNTER — Other Ambulatory Visit: Payer: Self-pay | Admitting: Pulmonary Disease

## 2021-03-06 ENCOUNTER — Other Ambulatory Visit: Payer: Self-pay | Admitting: Acute Care

## 2021-03-07 ENCOUNTER — Other Ambulatory Visit: Payer: Self-pay | Admitting: Pulmonary Disease

## 2021-03-13 ENCOUNTER — Telehealth: Payer: Self-pay | Admitting: Nutrition

## 2021-03-13 ENCOUNTER — Encounter: Payer: Medicare Other | Admitting: Nutrition

## 2021-03-13 NOTE — Telephone Encounter (Signed)
Telephone follow-up completed with patient status post treatment for [place] cancer.  Patient completed treatment on February 21.  Last weight documented was 113 pounds on July 7. Patient reports she has not used her feeding tube for 1 week.  She is drinking 3 cartons of boost plus a day which provide approximately 1080 cal.  She continues to eat lots of vegetables and is trying to eat more protein.  She is adding butter to her vegetables to increase calories.  Reports her feeding tube looks infected to her and she would like someone to look at it.  She really wants to have this tube removed.  Nutrition diagnosis: Inadequate oral intake appears stable.  Intervention: Recommend she contact MD regarding evaluation of feeding tube. Increase boost plus 4 times daily to provide a total of 1440 cal. Reviewed strategies for increasing calories and protein to promote weight gain.  Expect patient would require approximately 2300 cal daily to promote slow weight gain.  Considering patient's food preferences it will be difficult for her to gain weight without additional oral nutrition supplements.  Monitoring, evaluation, goals: Patient will tolerate adequate calories and protein to promote slow weight gain.  Next visit: Phone follow-up in approximately 1 month.  **Disclaimer: This note was dictated with voice recognition software. Similar sounding words can inadvertently be transcribed and this note may contain transcription errors which may not have been corrected upon publication of note.**

## 2021-03-19 ENCOUNTER — Other Ambulatory Visit: Payer: Self-pay | Admitting: Radiology

## 2021-03-19 ENCOUNTER — Other Ambulatory Visit: Payer: Self-pay | Admitting: Internal Medicine

## 2021-03-20 ENCOUNTER — Ambulatory Visit (HOSPITAL_COMMUNITY)
Admission: RE | Admit: 2021-03-20 | Discharge: 2021-03-20 | Disposition: A | Discharge status: Home / Self Care | Payer: Medicare Other | Source: Ambulatory Visit | Attending: Hematology and Oncology | Admitting: Hematology and Oncology

## 2021-03-20 ENCOUNTER — Other Ambulatory Visit: Payer: Self-pay

## 2021-03-20 DIAGNOSIS — C321 Malignant neoplasm of supraglottis: Secondary | ICD-10-CM | POA: Diagnosis not present

## 2021-03-20 DIAGNOSIS — Z452 Encounter for adjustment and management of vascular access device: Secondary | ICD-10-CM | POA: Diagnosis not present

## 2021-03-20 HISTORY — PX: IR REMOVAL TUN ACCESS W/ PORT W/O FL MOD SED: IMG2290

## 2021-03-20 MED ORDER — LIDOCAINE-EPINEPHRINE 1 %-1:100000 IJ SOLN
INTRAMUSCULAR | Status: AC
  Filled 2021-03-20: qty 1

## 2021-03-20 NOTE — Progress Notes (Signed)
Oncology Nurse Navigator Documentation   Ms. Top voiced concerned to Dory Peru RD that her PEG site was infected during a recent phone assessment. Ms. Lucks requested that I meet with her after her PAC removal today to assess the site. I examined the PEG site and to my observation there is not an active infection to the area. There is yellow/brown drainage present and mild redness. She does not have a fever and tells me that the site has had a similar appearance for weeks to months. I encouraged her to keep the site clean with a dressing intact and she voiced her understanding. She expressed her desire to have the PEG removed as she has not used it for two weeks. I advised that I would call her in 2 weeks to get home weight values from her and if her weight has remained stable I would inform Dr. Chryl Heck.    Harlow Asa RN, BSN, OCN Head & Neck Oncology Nurse [place] [facility] Phone # [phone]  Fax # [phone]

## 2021-03-20 NOTE — Discharge Instructions (Signed)
Implanted Port Removal, Care After This sheet gives you information about how to care for yourself after your procedure. Your health care provider may also give you more specific instructions. If you have problems or questions, contact your health careprovider. What can I expect after the procedure? After the procedure, it is common to have: Soreness or pain near your incision. Some swelling or bruising near your incision. Follow these instructions at home: Medicines Take over-the-counter and prescription medicines only as told by your health care provider. If you were prescribed an antibiotic medicine, take it as told by your health care provider. Do not stop taking the antibiotic even if you start to feel better. Bathing Do not take baths, swim, or use a hot tub until your health care provider approves. Ask your health care provider if you can take showers. You may only be allowed to take sponge baths. Incision care  Follow instructions from your health care provider about how to take care of your incision. Make sure you: Wash your hands with soap and water before you change your bandage (dressing). If soap and water are not available, use hand sanitizer. Change your dressing as told by your health care provider. Keep your dressing dry. Leave stitches (sutures), skin glue, or adhesive strips in place. These skin closures may need to stay in place for 2 weeks or longer. If adhesive strip edges start to loosen and curl up, you may trim the loose edges. Do not remove adhesive strips completely unless your health care provider tells you to do that. Check your incision area every day for signs of infection. Check for: More redness, swelling, or pain. More fluid or blood. Warmth. Pus or a bad smell.  Driving  Do not drive for 24 hours if you were given a medicine to help you relax (sedative) during your procedure. If you did not receive a sedative, ask your health care provider when it is  safe to drive.  Activity Return to your normal activities as told by your health care provider. Ask your health care provider what activities are safe for you. Do not lift anything that is heavier than 10 lb (4.5 kg), or the limit that you are told, until your health care provider says that it is safe. Do not do activities that involve lifting your arms over your head. General instructions Do not use any products that contain nicotine or tobacco, such as cigarettes and e-cigarettes. These can delay healing. If you need help quitting, ask your health care provider. Keep all follow-up visits as told by your health care provider. This is important. Contact a health care provider if: You have more redness, swelling, or pain around your incision. You have more fluid or blood coming from your incision. Your incision feels warm to the touch. You have pus or a bad smell coming from your incision. You have pain that is not relieved by your pain medicine. Get help right away if you have: A fever or chills. Chest pain. Difficulty breathing. Summary After the procedure, it is common to have pain, soreness, swelling, or bruising near your incision. If you were prescribed an antibiotic medicine, take it as told by your health care provider. Do not stop taking the antibiotic even if you start to feel better. Do not drive for 24 hours if you were given a sedative during your procedure. Return to your normal activities as told by your health care provider. Ask your health care provider what activities are safe for you.  This information is not intended to replace advice given to you by your health care provider. Make sure you discuss any questions you have with your healthcare provider. Document Revised: 09/04/2017 Document Reviewed: 09/04/2017 Elsevier Patient Education  2022 [employer].

## 2021-03-20 NOTE — Progress Notes (Addendum)
Patient ID: Raven Torres, female   DOB: [birth year], 65 y.o.   MRN: [id number] Patient presents to IR department today for Port-A-Cath removal.  She has a history of supraglottic laryngeal carcinoma and has completed chemoradiation.  She is no longer using her Port-A-Cath.  Details/risks of procedure, including but not limited to, internal bleeding, infection, injury to adjacent structures discussed with patient with her understanding and consent.  She does not wish to receive IV conscious sedation.

## 2021-03-20 NOTE — Procedures (Addendum)
Interventional Radiology Procedure:   Indications: History of laryngeal cancer and port is no longer needed  Procedure: Port removal  Findings: Complete removal of right chest port  Complications: No immediate complications noted.     EBL: Minimal  Plan: Discharge to home.  Keep bandage on for 24 hours, then incision may get wet.     Floreen Teegarden R. Anselm Pancoast, MD  Pager: [phone]

## 2021-03-22 MED ORDER — CIPROFLOXACIN HCL 500 MG PO TABS: 500.0000 mg | ORAL_TABLET | Freq: Two times a day (BID) | ORAL | 0 refills | Status: DC

## 2021-03-29 ENCOUNTER — Telehealth: Payer: Self-pay | Admitting: Nutrition

## 2021-03-29 ENCOUNTER — Ambulatory Visit: Payer: Medicare Other | Admitting: Nutrition

## 2021-03-29 NOTE — Progress Notes (Signed)
Patient returned phone call.  Patient is distressed that she cannot gain weight.  Reports weighing daily and states weight on home scale was 108 pounds this morning.  This is stable.  She is drinking 4 cartons of boost plus daily in an attempt to add calories and protein.  She still experiences taste alterations and said ice cream tastes terrible.  She was able to eat steak and fried potatoes last night for dinner.  She is not using her feeding tube and continues to request it to be removed.  Applauded patient's efforts to increase calories and protein.  Encouraged consistency in calorie and protein intake as well as boost plus 4 times daily or equivalent. Again reviewed strategies for adding calories and protein.  Encourage small frequent meals.  Will contact patient again in approximately 1 month.

## 2021-03-29 NOTE — Telephone Encounter (Signed)
Attempted nutrition follow-up by telephone.  Patient status post treatment for [place] cancer.  She completed treatment on February 21.  Weight is stable at 113 pounds.  During last telephone follow-up patient reported she was not using her feeding tube and was trying to drink 3 cartons of boost plus a day.  Patient was not available by phone today however I left a message with my name and phone number for follow-up.

## 2021-04-10 ENCOUNTER — Other Ambulatory Visit: Payer: Self-pay

## 2021-04-10 DIAGNOSIS — C321 Malignant neoplasm of supraglottis: Secondary | ICD-10-CM

## 2021-04-10 NOTE — Progress Notes (Signed)
r 

## 2021-04-10 NOTE — Progress Notes (Signed)
Oncology Nurse Navigator Documentation   I called and spoke to Raven Torres today regarding her PEG tube. She has been keeping record of her weights a home and tells me that since 03/14/21 her weight has fluctuated between 110 lbs and 112 lbs. She has not used her PEG tube since the beginning of August and is eating food orally without difficulty along with 4 ensures daily. I encouraged her to keep drinking the ensure until she is able to tolerate more foods orally and she agreed. Dr. Chryl Heck was made aware of the above and an order was placed to have her PEG removed. I called Ms. Nicklaus and told her to expect a call to get it scheduled. She voiced her appreciation and knows to call me if she has any questions or concerns in the future.   Harlow Asa RN, BSN, OCN Head & Neck Oncology Nurse [place] at [facility] Phone # [phone]  Fax # [phone]

## 2021-04-11 IMAGING — CT CT CHEST LUNG CANCER SCREENING LOW DOSE W/O CM
1 series · 10 of 10 positions shown, 13 images · non-contrast
Comparison: 01/19/2019

CLINICAL DATA: Fifty-one pack-year smoking history. Current smoker.

EXAM:
CT CHEST WITHOUT CONTRAST LOW-DOSE FOR LUNG CANCER SCREENING
TECHNIQUE: Multidetector CT imaging of the chest was performed following the
standard protocol without IV contrast.

[ct lung segmentation data · axial · 0.78mm/px · z∈[-1125,-1125]mm · 10 of 364 frames shown]
[frame 1/364  mediastinal]
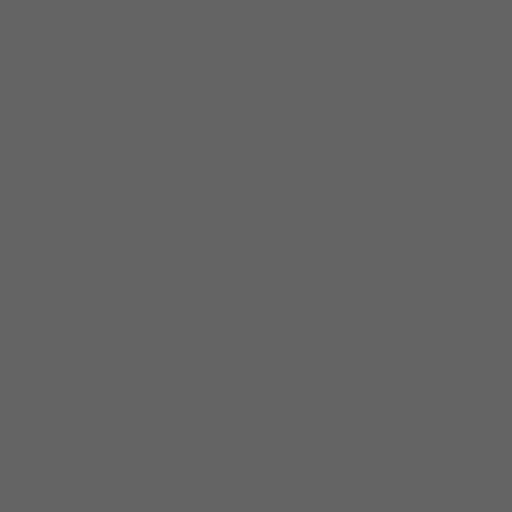
[frame 1/364  lung]
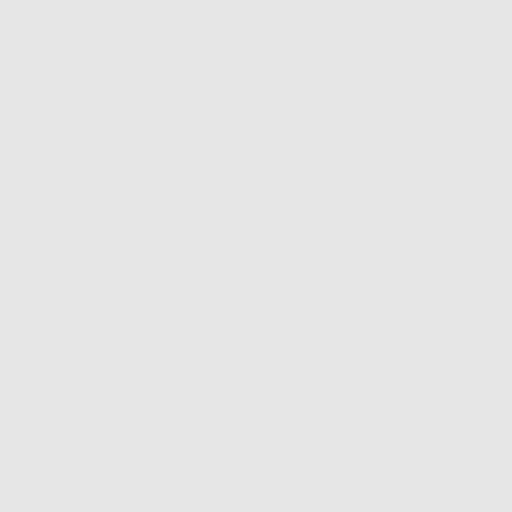
[frame 41/364  lung]
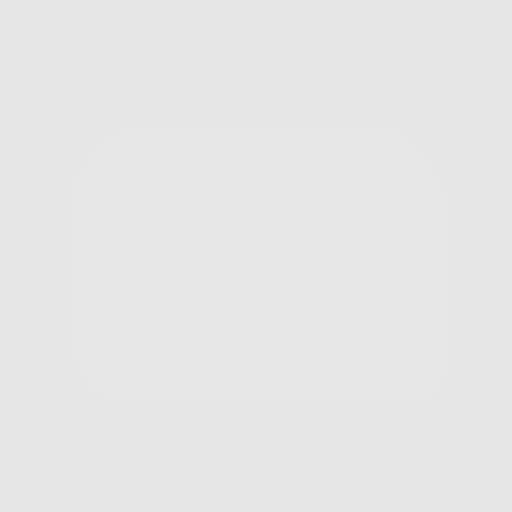
[frame 81/364  lung]
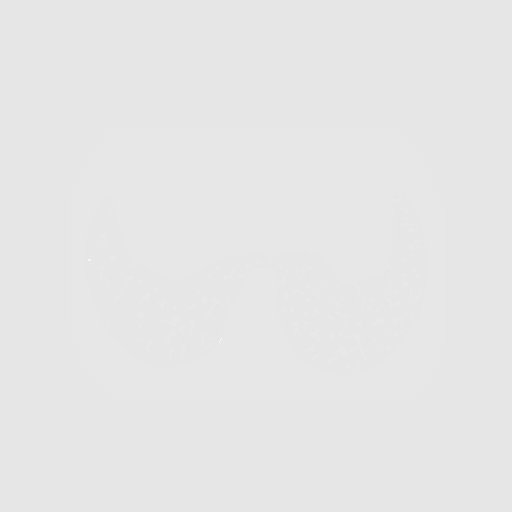
[frame 122/364  lung]
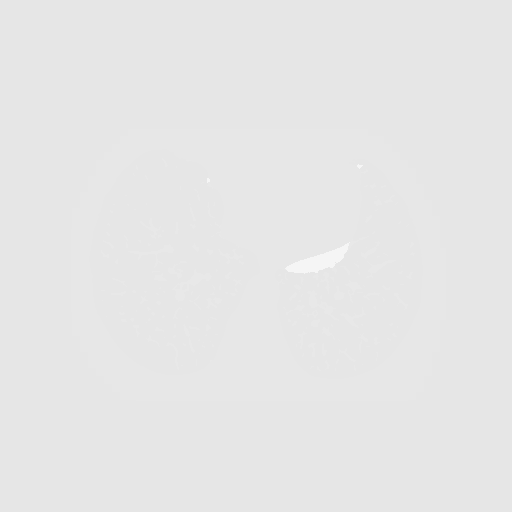
[frame 162/364  mediastinal]
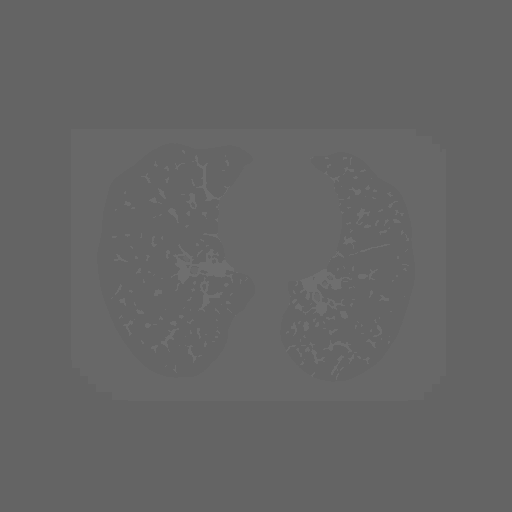
[frame 162/364  lung]
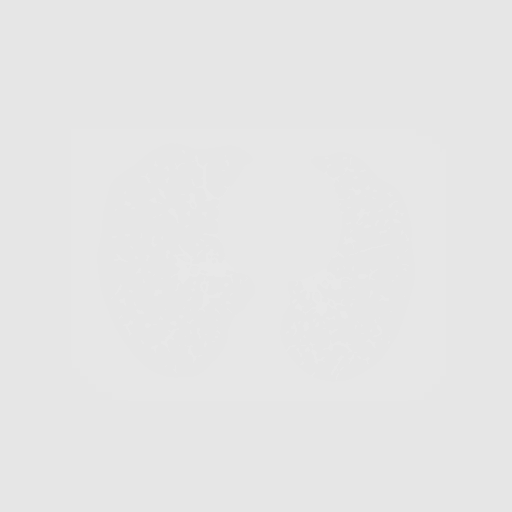
[frame 202/364  lung]
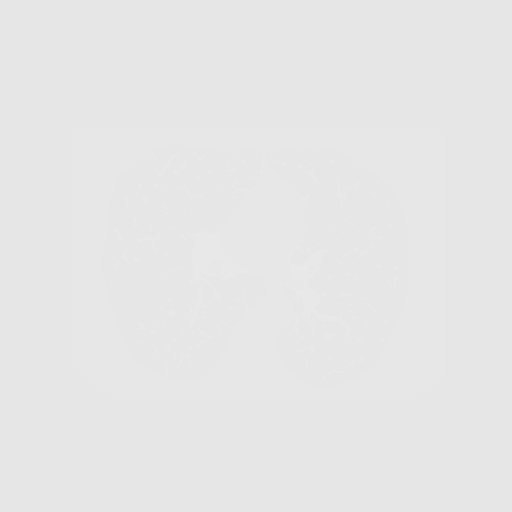
[frame 243/364  lung]
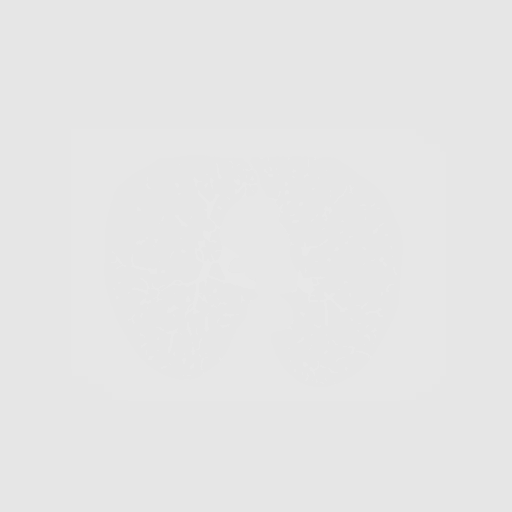
[frame 283/364  lung]
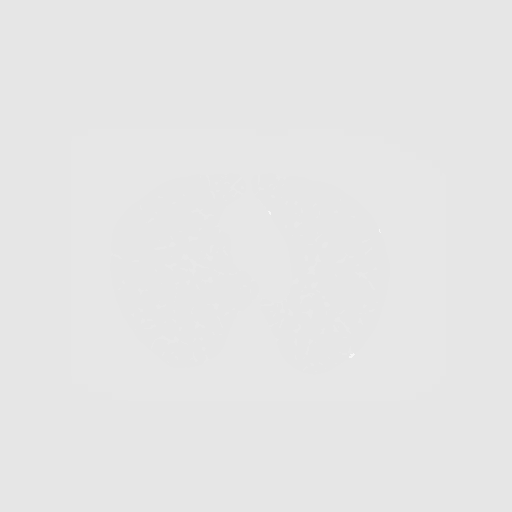
[frame 323/364  mediastinal]
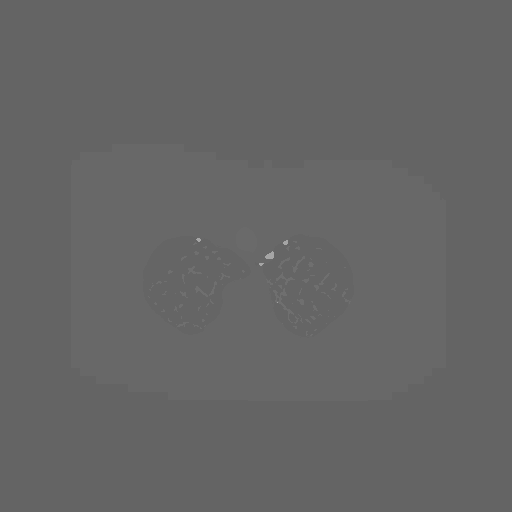
[frame 323/364  lung]
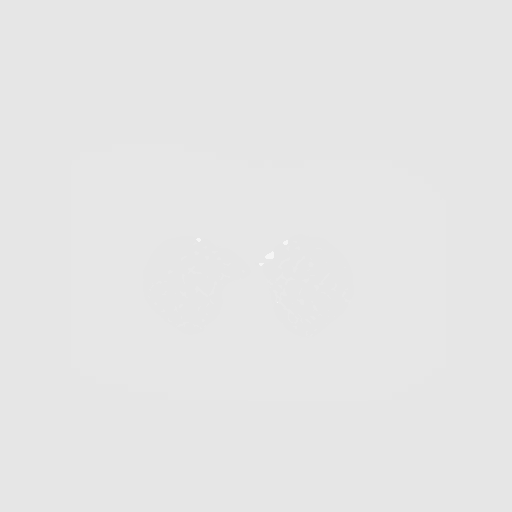
[frame 364/364  lung]
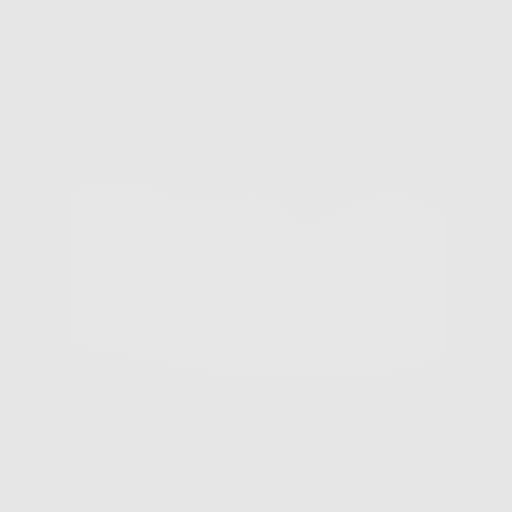

[10 of 10 positions shown; findings below may reference images not displayed]

FINDINGS: Cardiovascular: Aortic atherosclerosis. Tortuous thoracic aorta.
Normal heart size, without pericardial effusion. Multivessel
coronary artery atherosclerosis.

Mediastinum/Nodes: No mediastinal or definite hilar adenopathy,
given limitations of unenhanced CT.

Lungs/Pleura: No pleural fluid. Moderate bullous type emphysema.
High right tracheal diverticulum at approximately the 7 o'clock
position. Minimal motion degradation inferiorly. Bilateral pulmonary
nodules of maximally 4.7 mm. No suspicious or significantly
enlarging nodule.

Upper Abdomen: Advanced cirrhosis. Normal imaged portions of the
spleen, stomach, pancreas, gallbladder, adrenal glands. Renal
vascular calcifications. Possible concurrent punctate collecting
system calculi.

Musculoskeletal: No acute osseous abnormality.
IMPRESSION: 1. Lung-RADS 2, benign appearance or behavior. Continue annual
screening with low-dose chest CT without contrast in 12 months.
2. Aortic Atherosclerosis (0T69N-[id number].C) and Emphysema ([id number].X).
3. Age advanced coronary artery atherosclerosis. Recommend
assessment of coronary risk factors and consideration of medical
therapy.
4. Cirrhosis

## 2021-04-11 NOTE — Progress Notes (Signed)
Oncology Nurse Navigator Documentation   I spoke with Ms. Duignan today. Her last weight in Epic is 113 lbs on 02/08/21. She has been writing down her weights since 03/14/21 at home and tells me that they fluctuate from 110 lbs to 112 lbs. She has not used her feeding tube since the beginning of August and is eating all variety of foods and drinking 4 ensure daily. I notified Dr. Chryl Heck of the above information and an order has been placed to remove her feeding tube. I called Ms. Vetsch and left a message informing her that she should get a call from IR to schedule an appointment.   Harlow Asa RN, BSN, OCN Head & Neck Oncology Nurse [place] at [facility] Phone # [phone]  Fax # [phone]

## 2021-04-13 ENCOUNTER — Other Ambulatory Visit: Payer: Self-pay

## 2021-04-13 ENCOUNTER — Ambulatory Visit: Payer: Medicare Other | Attending: Internal Medicine | Admitting: Internal Medicine

## 2021-04-13 ENCOUNTER — Encounter: Payer: Self-pay | Admitting: Internal Medicine

## 2021-04-13 VITALS — BP 117/69 | HR 83 | Resp 16 | Wt 111.0 lb

## 2021-04-13 DIAGNOSIS — J432 Centrilobular emphysema: Secondary | ICD-10-CM | POA: Diagnosis not present

## 2021-04-13 DIAGNOSIS — R634 Abnormal weight loss: Secondary | ICD-10-CM

## 2021-04-13 DIAGNOSIS — F17200 Nicotine dependence, unspecified, uncomplicated: Secondary | ICD-10-CM

## 2021-04-13 DIAGNOSIS — E43 Unspecified severe protein-calorie malnutrition: Secondary | ICD-10-CM

## 2021-04-13 DIAGNOSIS — F17210 Nicotine dependence, cigarettes, uncomplicated: Secondary | ICD-10-CM

## 2021-04-13 DIAGNOSIS — R3589 Other polyuria: Secondary | ICD-10-CM | POA: Diagnosis not present

## 2021-04-13 DIAGNOSIS — Z23 Encounter for immunization: Secondary | ICD-10-CM | POA: Diagnosis not present

## 2021-04-13 LAB — POCT GLYCOSYLATED HEMOGLOBIN (HGB A1C): HbA1c, POC (controlled diabetic range): 4.8 % (ref 0.0–7.0)

## 2021-04-13 MED ORDER — VARENICLINE TARTRATE 0.5 MG X 11 & 1 MG X 42 PO MISC: ORAL | 0 refills | Status: DC

## 2021-04-13 NOTE — Progress Notes (Signed)
Patient ID: Raven Torres, female    DOB: 1956/03/11  MRN: [id number]  CC: Follow-up (Patient has question about medication.)   Subjective: Raven Torres is a 65 y.o. female who presents for chronic ds management Her concerns today include:  Patient with history of supraglottic laryngeal CA, HTN, cured hepatitis C with cirrhosis, adjustment disorder, HL, tobacco dependence, and rheumatoid arthritis/fibromyalgia (Dr. Posey Pronto at [facility]) , memory decline (MMSE 05/2019 was 30/30), COPD GOLD III, lung nodules (Dr. Vaughan Browner), coronary atherosclerosis  Will have feeding tube taken out next wk Eating  everything by mouth.  "No taste but I eat any and everything." Drinking 4 nutritional shakes/day (total of 1400 cal/day with the shakes).  Does not like being as small as she is. Lacks energy.  No cough with swallowing as long as she takes Mucinex and her inhalers. Mucinex helps to decrease phlegm in throat which increased when she had XRT. Smoking more than before.  Currently up to 1 pk a day.  Wellbutrin no longer helps.  Patches did not help. BP remains good off meds She has not f/u with her rheumatologist as yet but plans too. Some cramps in fingers and some pain in lower back at times  Urinating frequently with very little coming out each time. Feels pressure when she urinates.  Her daughter had sent me a MyChart message a few weeks ago to report that she was having symptoms again to suggest recurrent UTI.  I sent prescription for Cipro for her.  She has completed the Cipro.  Symptoms better since last round abx.  Patient Active Problem List   Diagnosis Date Noted   Protein-calorie malnutrition, severe 09/13/2020   Hypotension 09/12/2020   Tobacco dependence 08/11/2020   Malignant neoplasm of supraglottis ([place]) 07/13/2020   Herpes zoster without complication [id number]   GOLD COPD III B 04/23/2018   Pruritic dermatitis 04/23/2018   High cholesterol 06/24/2017   Shortness of breath  06/04/2017   Coronary artery calcification 06/04/2017   Other fatigue 06/04/2017   Centrilobular emphysema ([place]) 05/15/2017   Atherosclerosis of arteries 05/15/2017   Poor memory 05/15/2017   TB lung, latent 04/16/2017   Lung nodules 04/16/2017   Diabetes mellitus screening 11/19/2016   Fibromyalgia 05/15/2015   Rheumatoid arthritis ([place]) 08/22/2014   HTN (hypertension) 08/22/2014   Tobacco abuse 08/22/2014   Hepatic cirrhosis ([place]) 05/31/2014   Chronic hepatitis C virus infection ([place]) 05/03/2014     Current Outpatient Medications on File Prior to Visit  Medication Sig Dispense Refill   albuterol (PROVENTIL) (2.5 MG/3ML) 0.083% nebulizer solution Take 3 mLs (2.5 mg total) by nebulization every 6 (six) hours as needed for wheezing or shortness of breath. 150 mL 1   albuterol (VENTOLIN HFA) 108 (90 Base) MCG/ACT inhaler INHALE 2 PUFFS INTO THE LUNGS EVERY 6 HOURS AS NEEDED FOR WHEEZING OR SHORTNESS OF BREATH (Patient taking differently: Inhale 2 puffs into the lungs every 6 (six) hours as needed for wheezing or shortness of breath.) 8.5 g 3   BEVESPI AEROSPHERE 9-4.8 MCG/ACT AERO INHALE 2 PUFFS INTO THE LUNGS TWICE DAILY (Patient taking differently: Inhale 2 puffs into the lungs in the morning and at bedtime.) 10.7 g 1   lactose free nutrition (BOOST) LIQD Take 237 mLs by mouth 3 (three) times daily between meals.     lidocaine (XYLOCAINE) 2 % solution Patient: Mix 1part 2% viscous lidocaine, 1part H20. Swallow 59m of diluted mixture, 383m before meals and at bedtime, up to QID (Patient taking differently:  Use as directed 15 mLs in the mouth or throat in the morning, at noon, in the evening, and at bedtime. Patient: Mix 1part 2% viscous lidocaine, 1part H20. Swallow 80m of diluted mixture, 317m before meals and at bedtime, up to QID) 200 mL 4   No current facility-administered medications on file prior to visit.    Allergies  Allergen Reactions   Tudorza Pressair [Aclidinium Bromide]  Itching   Amitriptyline Other (See Comments)    Eye spasms per pt   Codeine Nausea Only   Tramadol Nausea Only    Social History   Socioeconomic History   Marital status: Married    Spouse name: Not on file   Number of children: Not on file   Years of education: Not on file   Highest education level: Not on file  Occupational History   Not on file  Tobacco Use   Smoking status: Every Day    Packs/day: 1.00    Years: 51.00    Pack years: 51.00    Types: Cigarettes    Start date: 1956 Smokeless tobacco: Never   Tobacco comments:    4 cigs a day   Vaping Use   Vaping Use: Never used  Substance and Sexual Activity   Alcohol use: No    Alcohol/week: 0.0 standard drinks   Drug use: No   Sexual activity: Not Currently  Other Topics Concern   Not on file  Social History Narrative   Not on file   Social Determinants of Health   Financial Resource Strain: Not on file  Food Insecurity: No Food Insecurity   Worried About Running Out of Food in the Last Year: Never true   Ran Out of Food in the Last Year: Never true  Transportation Needs: No Transportation Needs   Lack of Transportation (Medical): No   Lack of Transportation (Non-Medical): No  Physical Activity: Not on file  Stress: Not on file  Social Connections: Not on file  Intimate Partner Violence: Not on file    Family History  Problem Relation Age of Onset   Heart disease Mother    Cancer Mother    Melanoma Mother    Esophageal cancer Father    Colon cancer Neg Hx    Colon polyps Neg Hx    Rectal cancer Neg Hx    Stomach cancer Neg Hx     Past Surgical History:  Procedure Laterality Date   ABDOMINAL HYSTERECTOMY     CATARACT EXTRACTION, BILATERAL     COLONOSCOPY     COLONOSCOPY  04/04/2020   DIRECT LARYNGOSCOPY N/A 06/26/2020   Procedure: DIRECT LARYNGOSCOPY AND BIOPSY;  Surgeon: NeRozetta NunneryMD;  Location: [facility] Service: ENT;  Laterality: N/A;   IR GASTROSTOMY TUBE MOD SED   07/27/2020   IR IMAGING GUIDED PORT INSERTION  07/27/2020   IR REMOVAL TUN ACCESS W/ PORT W/O FL MOD SED  03/20/2021   KNEE SURGERY Right    POLYPECTOMY  11/17/2008   HPP x 1     ROS: Review of Systems Negative except as stated above  PHYSICAL EXAM: BP 117/69   Pulse 83   Resp 16   Wt 111 lb (50.3 kg)   SpO2 96%   BMI 18.47 kg/m   Wt Readings from Last 3 Encounters:  04/13/21 111 lb (50.3 kg)  02/08/21 113 lb 12.8 oz (51.6 kg)  01/16/21 113 lb 8 oz (51.5 kg)    Physical Exam  General appearance -patient  appears chronically ill with significant weight loss. Mental status - normal mood, behavior, speech, dress, motor activity, and thought processes Neck - supple, no significant adenopathy Chest -breath sounds slightly decreased but no wheezes or crackles heard. Heart - normal rate, regular rhythm, normal S1, S2, no murmurs, rubs, clicks or gallops Extremities - peripheral pulses normal, no pedal edema, no clubbing or cyanosis   CMP Latest Ref Rng & Units 02/08/2021 10/10/2020 09/25/2020  Glucose 70 - 99 mg/dL 99 109(H) 153(H)  BUN 8 - 23 mg/dL '16 16 9  '$ Creatinine 0.44 - 1.00 mg/dL 0.78 0.75 0.68  Sodium 135 - 145 mmol/L 140 139 138  Potassium 3.5 - 5.1 mmol/L 4.7 4.5 3.5  Chloride 98 - 111 mmol/L 102 103 103  CO2 22 - 32 mmol/L '30 26 25  '$ Calcium 8.9 - 10.3 mg/dL 9.4 9.5 8.4(L)  Total Protein 6.5 - 8.1 g/dL 6.8 - -  Total Bilirubin 0.3 - 1.2 mg/dL 0.4 - -  Alkaline Phos 38 - 126 U/L 65 - -  AST 15 - 41 U/L 27 - -  ALT 0 - 44 U/L 12 - -   Lipid Panel     Component Value Date/Time   CHOL 133 06/02/2020 0845   TRIG 84 06/02/2020 0845   HDL 67 06/02/2020 0845   CHOLHDL 2.0 06/02/2020 0845   CHOLHDL 2.5 02/26/2016 1132   VLDL 28 02/26/2016 1132   LDLCALC 50 06/02/2020 0845    CBC    Component Value Date/Time   WBC 5.3 02/08/2021 1201   RBC 3.97 02/08/2021 1201   HGB 12.4 02/08/2021 1201   HGB 13.8 06/02/2020 0845   HCT 37.6 02/08/2021 1201   HCT 39.7  06/02/2020 0845   PLT 93 (L) 02/08/2021 1201   PLT 114 (L) 06/02/2020 0845   MCV 94.7 02/08/2021 1201   MCV 92 06/02/2020 0845   MCH 31.2 02/08/2021 1201   MCHC 33.0 02/08/2021 1201   RDW 13.8 02/08/2021 1201   RDW 13.0 06/02/2020 0845   LYMPHSABS 1.1 02/08/2021 1201   LYMPHSABS 2.9 11/19/2016 1223   MONOABS 0.7 02/08/2021 1201   EOSABS 0.2 02/08/2021 1201   EOSABS 0.2 11/19/2016 1223   BASOSABS 0.0 02/08/2021 1201   BASOSABS 0.0 11/19/2016 1223   Results for orders placed or performed in visit on 04/13/21  POCT glycosylated hemoglobin (Hb A1C)  Result Value Ref Range   Hemoglobin A1C     HbA1c POC (<> result, manual entry)     HbA1c, POC (prediabetic range)     HbA1c, POC (controlled diabetic range) 4.8 0.0 - 7.0 %    ASSESSMENT AND PLAN: 1. Weight loss, unintentional Not fully stabilized as yet.  She will continue her nutritional shakes to supplement meals.  I inquired whether she sure about having the feeding tube removed at this time.  Patient states that she wants to have it removed and feels that she will do okay without it.  2. Severe protein-calorie malnutrition (HCC) Albumin has improved from 2.2 to 3.4.  3. Centrilobular emphysema (HCC) Continue Bevespi inhaler.  4. Tobacco dependence Strongly advised to quit.  I think it is unfortunate that she has started smoking even more.  She wants to quit but is finding it difficult.  She is agreeable to trying Chantix.  Advised that the Chantix can cause mood swings and bad dreams.  If she tolerates the starter pack, she will let me know so that I can send prescription for the continuation pack. - varenicline (  CHANTIX STARTING MONTH PAK) 0.5 MG X 11 & 1 MG X 42 tablet; Take one 0.5 mg tablet by mouth once daily for 3 days, then increase to one 0.5 mg tablet twice daily for 4 days, then increase to one 1 mg tablet twice daily.  Dispense: 53 tablet; Refill: 0  5. Polyuria Recheck UA and culture today. - Urinalysis, Routine w  reflex microscopic - Urine Culture - POCT glycosylated hemoglobin (Hb A1C)  6. Need for immunization against influenza - Flu Vaccine QUAD 37moIM (Fluarix, Fluzone & Alfiuria Quad PF)    Patient was given the opportunity to ask questions.  Patient verbalized understanding of the plan and was able to repeat key elements of the plan.   Orders Placed This Encounter  Procedures   Urine Culture   Flu Vaccine QUAD 678moM (Fluarix, Fluzone & Alfiuria Quad PF)   Urinalysis, Routine w reflex microscopic   POCT glycosylated hemoglobin (Hb A1C)     Requested Prescriptions   Signed Prescriptions Disp Refills   varenicline (CHANTIX STARTING MONTH PAK) 0.5 MG X 11 & 1 MG X 42 tablet 53 tablet 0    Sig: Take one 0.5 mg tablet by mouth once daily for 3 days, then increase to one 0.5 mg tablet twice daily for 4 days, then increase to one 1 mg tablet twice daily.    Return in about 4 months (around 08/13/2021).  DeKarle PlumberMD, FACP

## 2021-04-13 NOTE — Patient Instructions (Signed)
Eat smaller but more frequent meals.  I sent the prescription for Chantix to your pharmacy.

## 2021-04-14 LAB — URINALYSIS, ROUTINE W REFLEX MICROSCOPIC
Bilirubin, UA: NEGATIVE
Glucose, UA: NEGATIVE
Ketones, UA: NEGATIVE
Leukocytes,UA: NEGATIVE
Nitrite, UA: NEGATIVE
Protein,UA: NEGATIVE
RBC, UA: NEGATIVE
Specific Gravity, UA: 1.013 (ref 1.005–1.030)
Urobilinogen, Ur: 0.2 mg/dL (ref 0.2–1.0)
pH, UA: 6.5 (ref 5.0–7.5)

## 2021-04-16 LAB — URINE CULTURE

## 2021-04-17 ENCOUNTER — Ambulatory Visit (HOSPITAL_COMMUNITY)
Admission: RE | Admit: 2021-04-17 | Discharge: 2021-04-17 | Disposition: A | Discharge status: Home / Self Care | Payer: Medicare Other | Source: Ambulatory Visit | Attending: Hematology and Oncology | Admitting: Hematology and Oncology

## 2021-04-17 ENCOUNTER — Other Ambulatory Visit: Payer: Self-pay

## 2021-04-17 DIAGNOSIS — Z431 Encounter for attention to gastrostomy: Secondary | ICD-10-CM | POA: Diagnosis not present

## 2021-04-17 DIAGNOSIS — C321 Malignant neoplasm of supraglottis: Secondary | ICD-10-CM

## 2021-04-17 DIAGNOSIS — C329 Malignant neoplasm of larynx, unspecified: Secondary | ICD-10-CM | POA: Insufficient documentation

## 2021-04-17 HISTORY — PX: IR GASTROSTOMY TUBE REMOVAL: IMG5492

## 2021-04-17 MED ORDER — LIDOCAINE VISCOUS HCL 2 % MT SOLN
OROMUCOSAL | Status: AC
  Administered 2021-04-17: 4 mL via GASTROSTOMY
  Filled 2021-04-17: qty 15

## 2021-04-17 NOTE — Procedures (Signed)
Patient's 4 French pull-through gastrostomy tube was removed in its entirety without immediate complications.  Gauze dressing applied over site.  Site care instructions were reviewed with patient.  Medication used- viscous lidocaine into the gastrostomy tube insertion site tract.  EBL< 2 cc.

## 2021-04-18 ENCOUNTER — Other Ambulatory Visit: Payer: Self-pay | Admitting: Pulmonary Disease

## 2021-04-18 ENCOUNTER — Other Ambulatory Visit: Payer: Self-pay | Admitting: Internal Medicine

## 2021-04-18 NOTE — Telephone Encounter (Signed)
Raven Torres could you refill

## 2021-04-18 NOTE — Telephone Encounter (Signed)
Requested medication (s) are due for refill today: expired medication  Requested medication (s) are on the active medication list: yes   Last refill:  03/29/20 #8.5g 3 refills  Future visit scheduled: no  Notes to clinic:  expired medication. Last ordered by Eric Form, NP 1 year ago ,,do you want to renew Rx?     Requested Prescriptions  Pending Prescriptions Disp Refills   albuterol (VENTOLIN HFA) 108 (90 Base) MCG/ACT inhaler [Pharmacy Med Name: ALBUTEROL HFA INH (200 PUFFS)8.5GM] 8.5 g 3    Sig: INHALE 2 PUFFS INTO THE LUNGS EVERY 6 HOURS AS NEEDED FOR WHEEZING OR SHORTNESS OF BREATH     There is no refill protocol information for this order

## 2021-04-19 ENCOUNTER — Other Ambulatory Visit: Payer: Self-pay | Admitting: Pulmonary Disease

## 2021-05-21 ENCOUNTER — Ambulatory Visit (INDEPENDENT_AMBULATORY_CARE_PROVIDER_SITE_OTHER): Payer: Medicare Other | Admitting: Otolaryngology

## 2021-07-18 NOTE — Progress Notes (Signed)
[facility] CONSULT NOTE  Patient Care Team: Ladell Pier, MD as PCP - General (Internal Medicine) Debara Pickett Nadean Corwin, MD as PCP - Cardiology (Cardiology) Eppie Gibson, MD as Consulting Physician (Radiation Oncology) Benay Pike, MD as Consulting Physician (Hematology and Oncology) Malmfelt, Stephani Police, RN as Oncology Nurse Navigator  CHIEF COMPLAINTS/PURPOSE OF CONSULTATION:   Supraglottic cancer   ASSESSMENT & PLAN:   This is a very pleasant 65 year old female patient with past medical history significant for hypertension, rheumatoid arthritis, hepatitis C induced cirrhosis  s/p chemo radiation with weekly cisplatin for 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 End of treatment PET/CT with complete response, no concern for residual disease.   Physical examination and review of systems today without any clinical concerns for disease.  I encouraged her to reestablish with ENT for follow-up and surveillance. We will repeat TSH today, CBC without any major concerns.  She can return to clinic for follow-up with [place] in 6 months.  2 Weight loss or inability to gain, She continues to lose weight. I encourage follow up with nutrition and speech therapy to help with the weight.  In the past when we reviewed her thyroid function it was entirely normal.  At this time [name] unable to explain the reason that she continues to lose weight because she claims to eat at least 2000 kcal a day, drinks 6 boosts a day.  I encouraged her to see endocrinology for evaluation of other possible endocrinopathies which can explain weight loss or difficulty gaining weight.  It is also possible that she has chronic diseases such as COPD, cirrhosis which could be contributing to ongoing weight loss.  3. Encouraged smoking cessation.   Thank you for consulting [place] in the care of this patient.  Please do not hesitate to contact us with any additional questions or  concerns.  HISTORY OF PRESENTING ILLNESS:   BOBI DAUDELIN 65 y.o. female is here because of new diagnosis of laryngeal cancer  Oncology History Overview Note   LIISA PICONE is a 65 y.o. female who presents for evaluation of weight loss and enlarged right neck node.  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 She has had a approximate 30 pound weight loss over the past 4 months.  She is having no airway problems or hoarseness.MAHALIE KANNER is a 65 y.o. female who presents for evaluation of weight loss and enlarged right neck node. She has had a approximate 30 pound weight loss over the past 4 months.  She has no hoarseness or airway problems. On fiberoptic laryngoscopy she has abnormality of the laryngeal surface of the epiglottis and is taken to the operating room for direct laryngoscopy and biopsy.  06/16/2020 2.0 x 3.2 x 2.5 cm supraglottic laryngeal mass involving the epiglottis and extending along the aryepiglottic folds bilaterally. Tumor extends into the pre-epiglottic fat and likely into the vallecula. Tumor extends into the upper thyroid cartilage bilaterally, abutting and possibly extending through the outer cortex (particularly on the left). This almost certainly reflects a supraglottic laryngeal squamous cell carcinoma  06/26/2020, she had laryngoscopy which showed there was a friable ulcerative tumor involving predominantly the  supraglottic area above the vocal cords as the vocal cords were clear to evaluation.  This extended up the laryngeal surface of epiglottis, more on the right  side. A direct laryngoscopy was also performed  of the piriform sinuses which were clear bilaterally.  06/28/2020  1. The supraglottic mass has a maximum SUV of 12.5 and the enlarged right level II lymph node has a maximum SUV of 16.9,  both compatible with malignancy. No findings of metastatic disease to the chest, abdomen, pelvis, or regional skeleton. 2. New lingular ground-glass opacity compared to 01/20/2020 with very low-grade activity, probably from alveolitis or low-grade atypical infection. 3. Aortic Atherosclerosis (ICD10-I70.0) and Emphysema (ICD10-J43.9). 4. Airway thickening is present, suggesting bronchitis or reactive airways disease. 5. 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 6.  Prominent stool throughout the colon favors constipation.  Her case was discussed in the tumor board, and radiology review suggested that there is no definitive evidence of T4 involvement, hence plan was to consider concurrent chemoradiation and thus medical oncology referral.  She completed 5 weekly cycles of cisplatin 08/2020.    Malignant neoplasm of supraglottis ([place])  07/10/2020 Cancer Staging   Staging form: Larynx - Supraglottis, AJCC 8th Edition - Clinical stage from 07/10/2020: Stage IVA (cT3, cN2a, cM0) - Signed by Eppie Gibson, MD on 07/14/2020    07/13/2020 Initial Diagnosis   Laryngeal cancer ([place])   07/25/2020 -  Chemotherapy    Patient is on Treatment Plan: HEAD/NECK CISPLATIN Q7D       End of treatment PET scan showed no residual hypermetabolic supraglottic tissue.No evidence of residual disease.  Interval history  She is here for FU by herself. She says she feels well.  She has been eating about 2000 kcal a day for the past 2 months and still has not been able to gain any weight, instead lost almost 10 pounds since July. She drinks about 6 energy drinks a day, each 1 is about 350 kcal.  She tells me that she eats candy, peanut butter, several sweets and she does not understand why she cannot gain weight or continues losing weight.  She smokes about 1 pack in 3 days.  She denies any worsening of hoarseness, sore throat, difficulty in  swallowing or speech.  No change in breathing.  She continues to have some thrombocytopenia from liver cirrhosis.  She is yet to follow-up with her rheumatology for her rheumatoid arthritis. Rest of the pertinent 10 point ROS reviewed and negative  MEDICAL HISTORY:  Past Medical History:  Diagnosis Date   Arthritis    Cataract    removed both eyes    Colitis    COPD (chronic obstructive pulmonary disease) ([place])    Hepatitis C    Hyperlipidemia    Hypertension    Shingles 03/27/2020   TB (tuberculosis)    treatment     SURGICAL HISTORY: Past Surgical History:  Procedure Laterality Date   ABDOMINAL HYSTERECTOMY     CATARACT EXTRACTION, BILATERAL     COLONOSCOPY     COLONOSCOPY  04/04/2020   DIRECT LARYNGOSCOPY N/A 06/26/2020   Procedure: DIRECT LARYNGOSCOPY AND BIOPSY;  Surgeon: Rozetta Nunnery, MD;  Location: [facility];  Service: ENT;  Laterality: N/A;   IR GASTROSTOMY TUBE MOD SED  07/27/2020   IR GASTROSTOMY TUBE REMOVAL  04/17/2021   IR IMAGING GUIDED PORT INSERTION  07/27/2020   IR REMOVAL TUN ACCESS W/ PORT W/O FL MOD SED  03/20/2021   KNEE SURGERY Right    POLYPECTOMY  11/17/2008   HPP x 1     SOCIAL HISTORY: Social History   Socioeconomic History   Marital  status: Married    Spouse name: Not on file   Number of children: Not on file   Years of education: Not on file   Highest education level: Not on file  Occupational History   Not on file  Tobacco Use   Smoking status: Every Day    Packs/day: 1.00    Years: 51.00    Pack years: 51.00    Types: Cigarettes    Start date: 38   Smokeless tobacco: Never   Tobacco comments:    4 cigs a day   Vaping Use   Vaping Use: Never used  Substance and Sexual Activity   Alcohol use: No    Alcohol/week: 0.0 standard drinks   Drug use: No   Sexual activity: Not Currently  Other Topics Concern   Not on file  Social History Narrative   Not on file   Social Determinants of Health   Financial Resource Strain:  Not on file  Food Insecurity: Not on file  Transportation Needs: Not on file  Physical Activity: Not on file  Stress: Not on file  Social Connections: Not on file  Intimate Partner Violence: Not on file    FAMILY HISTORY: Family History  Problem Relation Age of Onset   Heart disease Mother    Cancer Mother    Melanoma Mother    Esophageal cancer Father    Colon cancer Neg Hx    Colon polyps Neg Hx    Rectal cancer Neg Hx    Stomach cancer Neg Hx     ALLERGIES:  is allergic to [place] pressair [aclidinium bromide], amitriptyline, codeine, and tramadol.  MEDICATIONS:  Current Outpatient Medications  Medication Sig Dispense Refill   albuterol (PROVENTIL) (2.5 MG/3ML) 0.083% nebulizer solution Take 3 mLs (2.5 mg total) by nebulization every 6 (six) hours as needed for wheezing or shortness of breath. 150 mL 1   albuterol (VENTOLIN HFA) 108 (90 Base) MCG/ACT inhaler INHALE 2 PUFFS INTO THE LUNGS EVERY 6 HOURS AS NEEDED FOR WHEEZING OR SHORTNESS OF BREATH 8.5 g 3   BEVESPI AEROSPHERE 9-4.8 MCG/ACT AERO INHALE 2 PUFFS INTO THE LUNGS TWICE DAILY (Patient taking differently: Inhale 2 puffs into the lungs in the morning and at bedtime.) 10.7 g 1   lactose free nutrition (BOOST) LIQD Take 237 mLs by mouth 3 (three) times daily between meals.     lidocaine (XYLOCAINE) 2 % solution Patient: Mix 1part 2% viscous lidocaine, 1part H20. Swallow 56mL of diluted mixture, 61min before meals and at bedtime, up to QID (Patient taking differently: Use as directed 15 mLs in the mouth or throat in the morning, at noon, in the evening, and at bedtime. Patient: Mix 1part 2% viscous lidocaine, 1part H20. Swallow 77mL of diluted mixture, 78min before meals and at bedtime, up to QID) 200 mL 4   varenicline (CHANTIX STARTING MONTH PAK) 0.5 MG X 11 & 1 MG X 42 tablet Take one 0.5 mg tablet by mouth once daily for 3 days, then increase to one 0.5 mg tablet twice daily for 4 days, then increase to one 1 mg tablet  twice daily. 53 tablet 0   No current facility-administered medications for this visit.    PHYSICAL EXAMINATION: ECOG PERFORMANCE STATUS: 0 - Asymptomatic  Vitals:   07/19/21 1308  BP: 117/65  Pulse: 82  Resp: 18  Temp: 98.1 F (36.7 C)  SpO2: 92%    Physical Exam Constitutional:      Appearance: Normal appearance.  HENT:  Head: Normocephalic and atraumatic.     Mouth/Throat:     Pharynx: No oropharyngeal exudate or posterior oropharyngeal erythema.  Eyes:     Extraocular Movements: Extraocular movements intact.     Pupils: Pupils are equal, round, and reactive to light.  Cardiovascular:     Rate and Rhythm: Normal rate and regular rhythm.     Pulses: Normal pulses.     Heart sounds: Normal heart sounds.  Pulmonary:     Effort: Pulmonary effort is normal.     Breath sounds: Normal breath sounds.  Abdominal:     General: Abdomen is flat. There is no distension.     Palpations: Abdomen is soft. There is no mass.     Tenderness: There is no abdominal tenderness.     Comments: Marland Kitchen    Musculoskeletal:        General: No swelling or tenderness. Normal range of motion.     Cervical back: Normal range of motion and neck supple. Tenderness: rad changes, no ulceration.  Lymphadenopathy:     Cervical: No cervical adenopathy (No palpable cervical lymphadenopathy).  Skin:    General: Skin is warm and dry.  Neurological:     General: No focal deficit present.     Mental Status: She is alert.  Psychiatric:        Mood and Affect: Mood normal.      LABORATORY DATA:  I have reviewed the data as listed Lab Results  Component Value Date   WBC 5.8 07/19/2021   HGB 13.5 07/19/2021   HCT 42.4 07/19/2021   MCV 93.6 07/19/2021   PLT 123 (L) 07/19/2021     Chemistry      Component Value Date/Time   NA 141 07/19/2021 1250   NA 139 06/02/2020 0845   K 4.0 07/19/2021 1250   CL 103 07/19/2021 1250   CO2 29 07/19/2021 1250   BUN 15 07/19/2021 1250   BUN 12 06/02/2020  0845   CREATININE 0.79 07/19/2021 1250   CREATININE 0.70 02/26/2016 1143      Component Value Date/Time   CALCIUM 9.4 07/19/2021 1250   ALKPHOS 65 02/08/2021 1201   AST 27 02/08/2021 1201   ALT 12 02/08/2021 1201   BILITOT 0.4 02/08/2021 1201      RADIOGRAPHIC STUDIES: I have personally reviewed the radiological images as listed and agreed with the findings in the report. No results found.   All questions were answered. The patient knows to call the clinic with any problems, questions or concerns.  I spent 30 minutes in the care of this patient including history physical review of records, counseling and coordination of care.   Benay Pike, MD 07/19/2021 2:03 PM

## 2021-07-19 ENCOUNTER — Other Ambulatory Visit: Payer: Medicare Other

## 2021-07-19 ENCOUNTER — Ambulatory Visit: Payer: Medicare Other | Admitting: Hematology and Oncology

## 2021-07-19 ENCOUNTER — Inpatient Hospital Stay (HOSPITAL_BASED_OUTPATIENT_CLINIC_OR_DEPARTMENT_OTHER): Payer: Medicare Other | Admitting: Hematology and Oncology

## 2021-07-19 ENCOUNTER — Other Ambulatory Visit: Payer: Self-pay

## 2021-07-19 ENCOUNTER — Inpatient Hospital Stay: Payer: Medicare Other

## 2021-07-19 ENCOUNTER — Inpatient Hospital Stay: Payer: Medicare Other | Attending: Hematology and Oncology

## 2021-07-19 ENCOUNTER — Encounter: Payer: Self-pay | Admitting: Hematology and Oncology

## 2021-07-19 VITALS — BP 117/65 | HR 82 | Temp 98.1°F | Resp 18 | Wt 104.2 lb

## 2021-07-19 DIAGNOSIS — R634 Abnormal weight loss: Secondary | ICD-10-CM | POA: Insufficient documentation

## 2021-07-19 DIAGNOSIS — Z79899 Other long term (current) drug therapy: Secondary | ICD-10-CM | POA: Insufficient documentation

## 2021-07-19 DIAGNOSIS — D696 Thrombocytopenia, unspecified: Secondary | ICD-10-CM | POA: Insufficient documentation

## 2021-07-19 DIAGNOSIS — C321 Malignant neoplasm of supraglottis: Secondary | ICD-10-CM

## 2021-07-19 DIAGNOSIS — F17210 Nicotine dependence, cigarettes, uncomplicated: Secondary | ICD-10-CM | POA: Diagnosis not present

## 2021-07-19 DIAGNOSIS — I10 Essential (primary) hypertension: Secondary | ICD-10-CM | POA: Insufficient documentation

## 2021-07-19 DIAGNOSIS — M069 Rheumatoid arthritis, unspecified: Secondary | ICD-10-CM | POA: Diagnosis not present

## 2021-07-19 DIAGNOSIS — K7460 Unspecified cirrhosis of liver: Secondary | ICD-10-CM | POA: Diagnosis not present

## 2021-07-19 DIAGNOSIS — C329 Malignant neoplasm of larynx, unspecified: Secondary | ICD-10-CM

## 2021-07-19 LAB — CBC WITH DIFFERENTIAL (CANCER CENTER ONLY)
Abs Immature Granulocytes: 0.02 10*3/uL (ref 0.00–0.07)
Basophils Absolute: 0 10*3/uL (ref 0.0–0.1)
Basophils Relative: 0 %
Eosinophils Absolute: 0.2 10*3/uL (ref 0.0–0.5)
Eosinophils Relative: 4 %
HCT: 42.4 % (ref 36.0–46.0)
Hemoglobin: 13.5 g/dL (ref 12.0–15.0)
Immature Granulocytes: 0 %
Lymphocytes Relative: 19 %
Lymphs Abs: 1.1 10*3/uL (ref 0.7–4.0)
MCH: 29.8 pg (ref 26.0–34.0)
MCHC: 31.8 g/dL (ref 30.0–36.0)
MCV: 93.6 fL (ref 80.0–100.0)
Monocytes Absolute: 0.5 10*3/uL (ref 0.1–1.0)
Monocytes Relative: 9 %
Neutro Abs: 3.9 10*3/uL (ref 1.7–7.7)
Neutrophils Relative %: 68 %
Platelet Count: 123 10*3/uL — ABNORMAL LOW (ref 150–400)
RBC: 4.53 MIL/uL (ref 3.87–5.11)
RDW: 14.2 % (ref 11.5–15.5)
WBC Count: 5.8 10*3/uL (ref 4.0–10.5)
nRBC: 0 % (ref 0.0–0.2)

## 2021-07-19 LAB — BASIC METABOLIC PANEL - CANCER CENTER ONLY
Anion gap: 9 (ref 5–15)
BUN: 15 mg/dL (ref 8–23)
CO2: 29 mmol/L (ref 22–32)
Calcium: 9.4 mg/dL (ref 8.9–10.3)
Chloride: 103 mmol/L (ref 98–111)
Creatinine: 0.79 mg/dL (ref 0.44–1.00)
GFR, Estimated: >60 mL/min (ref >60)
Glucose, Bld: 86 mg/dL (ref 70–99)
Potassium: 4 mmol/L (ref 3.5–5.1)
Sodium: 141 mmol/L (ref 135–145)

## 2021-07-19 LAB — MAGNESIUM: Magnesium: 1.9 mg/dL (ref 1.7–2.4)

## 2021-07-19 LAB — TSH: TSH: 2.522 u[IU]/mL (ref 0.308–3.960)

## 2021-07-20 NOTE — Progress Notes (Signed)
Oncology Nurse Navigator Documentation   At Dr. Rob Hickman request I have scheduled Raven Torres to see Dr. Fredric Dine at [facility] on 08/17/21 at 2 pm due to her previous ENT's recent retirement. I have called and informed Raven Torres of the appointment date/time and she is agreeable. I have also provided her with the address of the office. She knows to call me if she has any questions or concerns in the future.   Harlow Asa RN, BSN, OCN Head & Neck Oncology Nurse [place] at [facility] Phone # [phone]  Fax # [phone]

## 2021-07-26 ENCOUNTER — Telehealth: Payer: Self-pay | Admitting: Hematology and Oncology

## 2021-07-26 NOTE — Telephone Encounter (Signed)
Sch per 12/15 los, pt aware °

## 2021-07-31 DIAGNOSIS — M0579 Rheumatoid arthritis with rheumatoid factor of multiple sites without organ or systems involvement: Secondary | ICD-10-CM | POA: Diagnosis not present

## 2021-07-31 DIAGNOSIS — M797 Fibromyalgia: Secondary | ICD-10-CM | POA: Diagnosis not present

## 2021-07-31 DIAGNOSIS — M546 Pain in thoracic spine: Secondary | ICD-10-CM | POA: Diagnosis not present

## 2021-07-31 DIAGNOSIS — M8588 Other specified disorders of bone density and structure, other site: Secondary | ICD-10-CM | POA: Diagnosis not present

## 2021-07-31 DIAGNOSIS — Z79899 Other long term (current) drug therapy: Secondary | ICD-10-CM | POA: Diagnosis not present

## 2021-07-31 DIAGNOSIS — M47814 Spondylosis without myelopathy or radiculopathy, thoracic region: Secondary | ICD-10-CM | POA: Diagnosis not present

## 2021-08-07 ENCOUNTER — Telehealth: Payer: Self-pay

## 2021-08-07 NOTE — Telephone Encounter (Signed)
Nutrition  Patient identified on Malnutrition Screening report for weight loss.   Called patient this afternoon but no answer.  Left message with call back number.   Kaeo Jacome B. Zenia Resides, [place], [place] Registered Dietitian [phone] (mobile)

## 2021-08-21 ENCOUNTER — Telehealth: Payer: Self-pay

## 2021-08-21 NOTE — Telephone Encounter (Signed)
Nutrition  2nd attempt to reach patient by phone.  No answer.  Left message with call back number on voicemail.    Luian Schumpert B. Zenia Resides, [place], [place] Registered Dietitian [phone] (mobile)

## 2021-09-12 ENCOUNTER — Other Ambulatory Visit: Payer: Self-pay | Admitting: Internal Medicine

## 2021-09-12 DIAGNOSIS — F17200 Nicotine dependence, unspecified, uncomplicated: Secondary | ICD-10-CM

## 2021-09-12 NOTE — Telephone Encounter (Signed)
Requested medication (s) are due for refill today:   No  Requested medication (s) are on the active medication list:   No  Future visit scheduled:   No   Last ordered: Discontinued 09/12/2020  Returned because not on the medication list.   Requested Prescriptions  Pending Prescriptions Disp Refills   buPROPion (WELLBUTRIN) 100 MG tablet [Pharmacy Med Name: BUPROPION 100MG  TABLETS] 60 tablet 2    Sig: TAKE 1 TABLET(100 MG) BY MOUTH TWICE DAILY     Psychiatry: Antidepressants - bupropion Passed - 09/12/2021  3:34 AM      Passed - Cr in normal range and within 360 days    Creatinine  Date Value Ref Range Status  07/19/2021 0.79 0.44 - 1.00 mg/dL Final   Creat  Date Value Ref Range Status  02/26/2016 0.70 0.50 - 0.99 mg/dL Final    Comment:      For patients > or = 68 years of age: The upper reference limit for Creatinine is approximately 13% higher for people identified as African-American.             Passed - AST in normal range and within 360 days    AST  Date Value Ref Range Status  02/08/2021 27 15 - 41 U/L Final          Passed - ALT in normal range and within 360 days    ALT  Date Value Ref Range Status  02/08/2021 12 0 - 44 U/L Final          Passed - Last BP in normal range    BP Readings from Last 1 Encounters:  07/19/21 117/65          Passed - Valid encounter within last 6 months    Recent Outpatient Visits           5 months ago Weight loss, unintentional   [place] Ladell Pier, MD   9 months ago Decreased hearing of right ear   [place] Ladell Pier, MD   11 months ago Hospital discharge follow-up   [place] Ladell Pier, MD   1 year ago Essential hypertension   [place], Deborah B, MD   1 year ago Neck mass   [facility] Charlott Rakes, MD

## 2021-09-13 ENCOUNTER — Telehealth: Payer: Self-pay

## 2021-09-13 NOTE — Telephone Encounter (Signed)
Nutrition  Third and final attempt to reach patient by telephone. Have left messages with return number. Please consult RD for future needs.   Aarini Slee B. Zenia Resides, [place], [place] Registered Dietitian [phone] (mobile)

## 2021-09-14 NOTE — Telephone Encounter (Signed)
Contacted pt to go over provider message pt didn't answer lvm

## 2021-10-11 ENCOUNTER — Telehealth: Payer: Self-pay | Admitting: Hematology and Oncology

## 2021-10-11 NOTE — Telephone Encounter (Signed)
Rescheduled appointment per providers template. Left message.  ? ?

## 2021-10-17 IMAGING — XA IR PERC PLACEMENT GASTROSTOMY
1 series · 7 of 7 positions shown · non-contrast
Comparison: none

INDICATION: Metastatic head and neck cancer (laryngeal cancer)

[Series 300: tube placements · 7 of 7 slices shown]
[im 1/7]
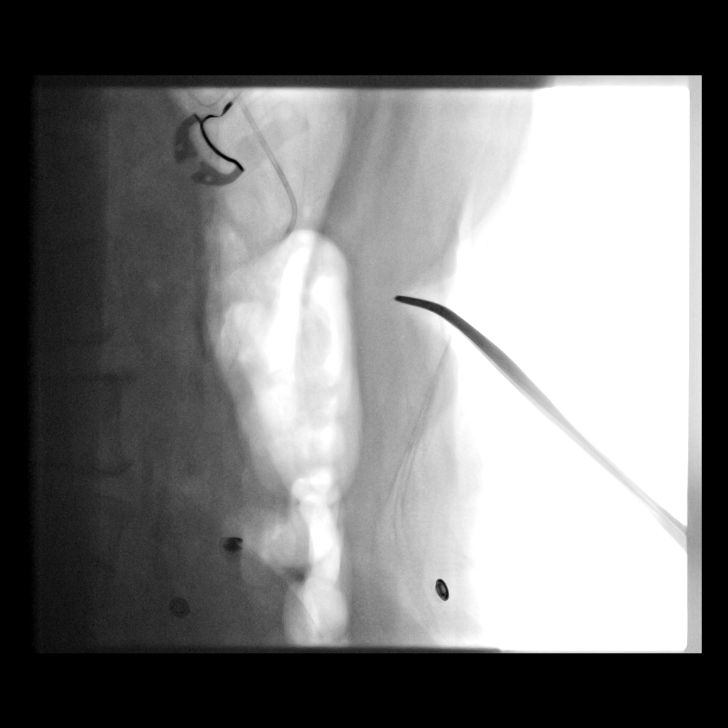
[im 2/7]
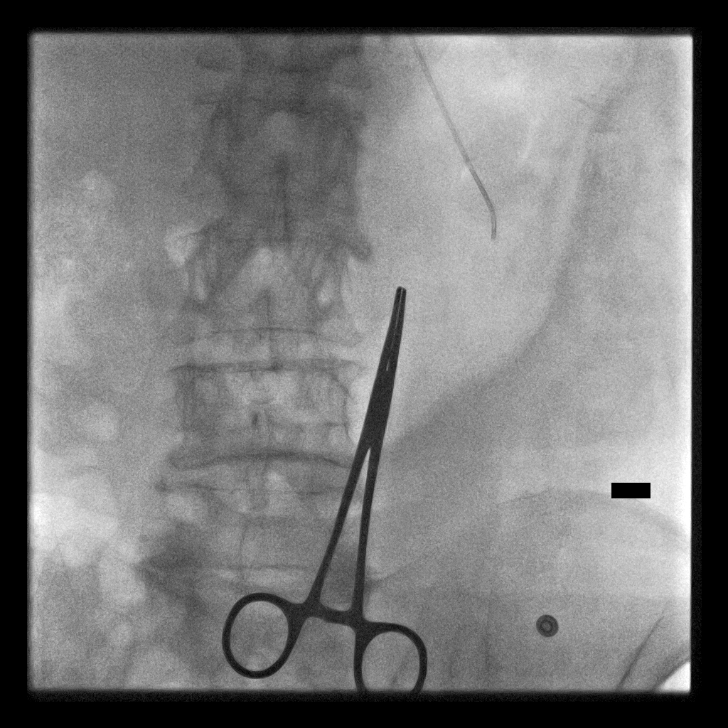
[im 3/7]
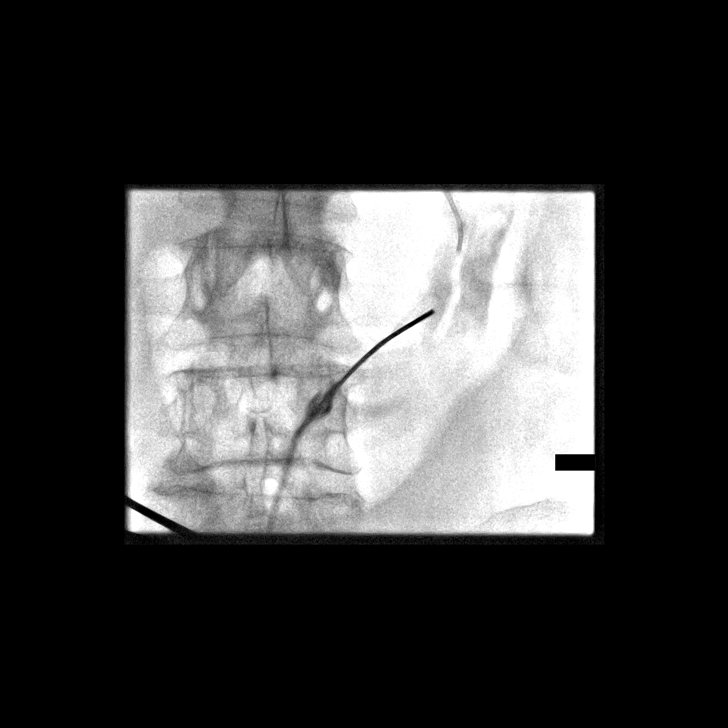
[im 4/7]
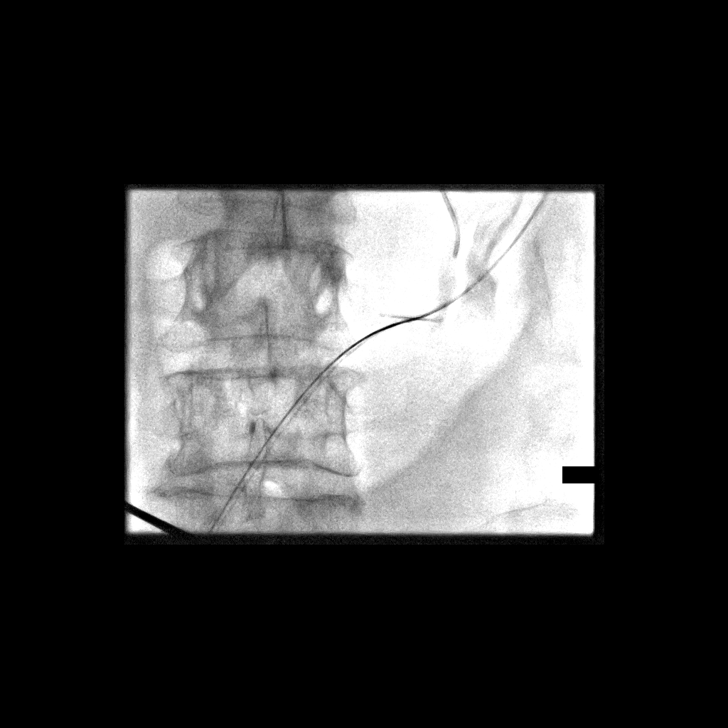
[im 5/7]
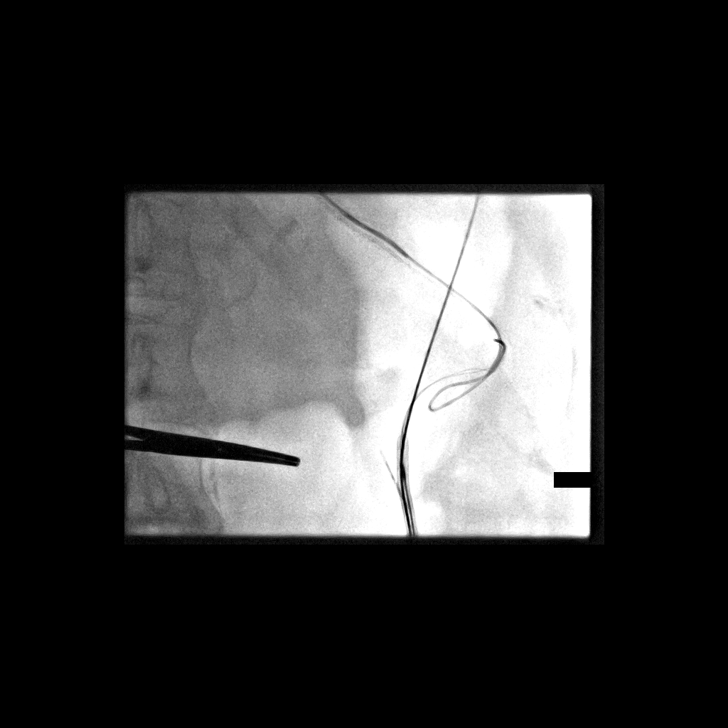
[im 6/7]
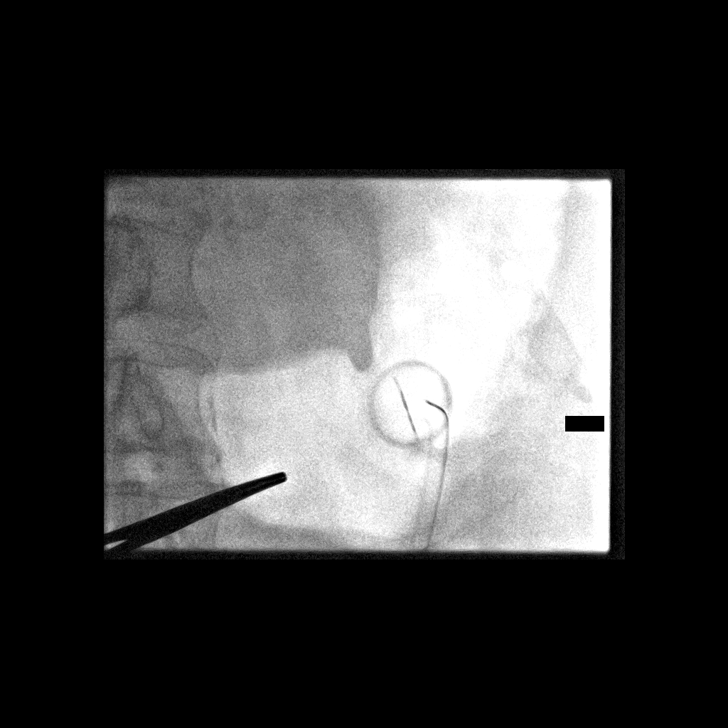
[im 7/7]
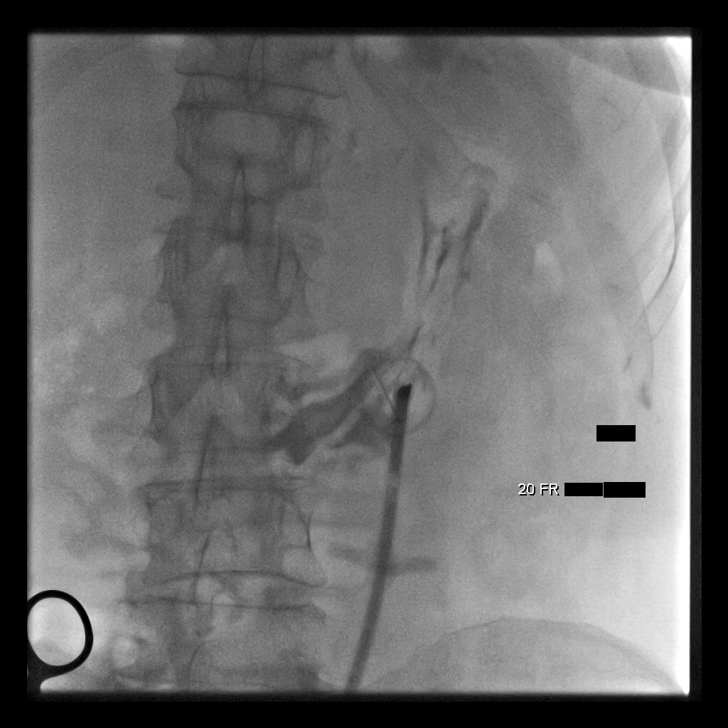

[7 of 7 positions shown; findings below may reference images not displayed]

EXAM:
FLUOROSCOPIC 20 FRENCH PULL-THROUGH GASTROSTOMY

Radiologist:  Mazarache, Elilisa

Guidance:  Fluoroscopic

MEDICATIONS:
Ancef 2 g; Antibiotics were administered within 1 hour of the
procedure. Glucagon 0.5 mg IV

ANESTHESIA/SEDATION:
Versed 1 mg IV; Fentanyl 100 mcg IV

Moderate Sedation Time:  16

The patient was continuously monitored during the procedure by the
interventional radiology nurse under my direct supervision.

CONTRAST:  20mL OMNIPAQUE IOHEXOL 300 MG/ML SOLN - administered into
the gastric lumen.

FLUOROSCOPY TIME:  Fluoroscopy Time: 3 minutes 36 seconds (37 mGy).

COMPLICATIONS:
None immediate.

PROCEDURE:
Informed consent was obtained from the patient following explanation
of the procedure, risks, benefits and alternatives. The patient
understands, agrees and consents for the procedure. All questions
were addressed. A time out was performed.

Maximal barrier sterile technique utilized including caps, mask,
sterile gowns, sterile gloves, large sterile drape, hand hygiene,
and betadine prep.

The left upper quadrant was sterilely prepped and draped. An oral
gastric catheter was inserted into the stomach under fluoroscopy.
The existing nasogastric feeding tube was removed. Air was injected
into the stomach for insufflation and visualization under
fluoroscopy. The air distended stomach was confirmed beneath the
anterior abdominal wall in the frontal and lateral projections.
Under sterile conditions and local anesthesia, a 17 gauge trocar
needle was utilized to access the stomach percutaneously beneath the
left subcostal margin. Needle position was confirmed within the
stomach under biplane fluoroscopy. Contrast injection confirmed
position also. A single T tack was deployed for gastropexy. Over an
Amplatz guide wire, a 9-French sheath was inserted into the stomach.
A snare device was utilized to capture the oral gastric catheter.
The snare device was pulled retrograde from the stomach up the
esophagus and out the oropharynx. The 20-French pull-through
gastrostomy was connected to the snare device and pulled antegrade
through the oropharynx down the esophagus into the stomach and then
through the percutaneous tract external to the patient. The
gastrostomy was assembled externally. Contrast injection confirms
position in the stomach. Images were obtained for documentation. The
patient tolerated procedure well. No immediate complication.
IMPRESSION: Fluoroscopic insertion of a 20-French "pull-through" gastrostomy.

## 2021-11-22 DIAGNOSIS — C329 Malignant neoplasm of larynx, unspecified: Secondary | ICD-10-CM | POA: Diagnosis not present

## 2021-11-22 DIAGNOSIS — M26609 Unspecified temporomandibular joint disorder, unspecified side: Secondary | ICD-10-CM | POA: Diagnosis not present

## 2021-11-22 DIAGNOSIS — R1310 Dysphagia, unspecified: Secondary | ICD-10-CM | POA: Diagnosis not present

## 2021-11-22 DIAGNOSIS — E43 Unspecified severe protein-calorie malnutrition: Secondary | ICD-10-CM | POA: Diagnosis not present

## 2021-11-23 ENCOUNTER — Other Ambulatory Visit: Payer: Self-pay | Admitting: Otolaryngology

## 2021-11-23 DIAGNOSIS — R1310 Dysphagia, unspecified: Secondary | ICD-10-CM

## 2021-11-27 ENCOUNTER — Other Ambulatory Visit: Payer: Self-pay | Admitting: Otolaryngology

## 2021-11-27 ENCOUNTER — Telehealth: Payer: Self-pay | Admitting: Hematology and Oncology

## 2021-11-27 ENCOUNTER — Ambulatory Visit
Admission: RE | Admit: 2021-11-27 | Discharge: 2021-11-27 | Disposition: A | Discharge status: Home / Self Care | Payer: Medicare Other | Source: Ambulatory Visit | Attending: Otolaryngology | Admitting: Otolaryngology

## 2021-11-27 DIAGNOSIS — R1310 Dysphagia, unspecified: Secondary | ICD-10-CM

## 2021-11-27 DIAGNOSIS — K224 Dyskinesia of esophagus: Secondary | ICD-10-CM | POA: Diagnosis not present

## 2021-11-27 NOTE — Telephone Encounter (Signed)
Rescheduled appointment per providers template. Left message.  ? ?

## 2021-11-30 ENCOUNTER — Ambulatory Visit: Payer: Medicare Other | Attending: Otolaryngology

## 2021-11-30 DIAGNOSIS — R1310 Dysphagia, unspecified: Secondary | ICD-10-CM | POA: Insufficient documentation

## 2021-11-30 DIAGNOSIS — R498 Other voice and resonance disorders: Secondary | ICD-10-CM | POA: Insufficient documentation

## 2021-11-30 NOTE — Therapy (Signed)
?OUTPATIENT SPEECH LANGUAGE PATHOLOGY SWALLOW EVALUATION ? ? ?Patient Name: SUBRENA DEVEREUX ?MRN: [id number] ?DOB:1955-12-05, 66 y.o., female ?Today's Date: 11/30/2021 ? ?PCP: Ladell Pier MD ?REFERRING PROVIDER: Skotnicki, Meghan A, DO  ? ? End of Session - 11/30/21 1038   ? ? Visit Number 1   ? Number of Visits 17   ? Date for SLP Re-Evaluation 02/22/22   ? Authorization Type [employer]   ? SLP Start Time [phone]   ? SLP Stop Time  1035   ? SLP Time Calculation (min) 44 min   ? Activity Tolerance Patient limited by pain   ? ?  ?  ? ?  ? ? ?Past Medical History:  ?Diagnosis Date  ? Arthritis   ? Cataract   ? removed both eyes   ? Colitis   ? COPD (chronic obstructive pulmonary disease) ([place])   ? Hepatitis C   ? Hyperlipidemia   ? Hypertension   ? Shingles 03/27/2020  ? TB (tuberculosis)   ? treatment   ? ?Past Surgical History:  ?Procedure Laterality Date  ? ABDOMINAL HYSTERECTOMY    ? CATARACT EXTRACTION, BILATERAL    ? COLONOSCOPY    ? COLONOSCOPY  04/04/2020  ? DIRECT LARYNGOSCOPY N/A 06/26/2020  ? Procedure: DIRECT LARYNGOSCOPY AND BIOPSY;  Surgeon: Rozetta Nunnery, MD;  Location: [facility];  Service: ENT;  Laterality: N/A;  ? IR GASTROSTOMY TUBE MOD SED  07/27/2020  ? IR GASTROSTOMY TUBE REMOVAL  04/17/2021  ? IR IMAGING GUIDED PORT INSERTION  07/27/2020  ? IR REMOVAL TUN ACCESS W/ PORT W/O FL MOD SED  03/20/2021  ? KNEE SURGERY Right   ? POLYPECTOMY  11/17/2008  ? HPP x 1   ? ?Patient Active Problem List  ? Diagnosis Date Noted  ? Protein-calorie malnutrition, severe 09/13/2020  ? Hypotension 09/12/2020  ? Tobacco dependence 08/11/2020  ? Malignant neoplasm of supraglottis ([place]) 07/13/2020  ? Herpes zoster without complication 37/90/2409  ? GOLD COPD III B 04/23/2018  ? Pruritic dermatitis 04/23/2018  ? High cholesterol 06/24/2017  ? Shortness of breath 06/04/2017  ? Coronary artery calcification 06/04/2017  ? Other fatigue 06/04/2017  ? Centrilobular emphysema ([place]) 05/15/2017  ? Atherosclerosis of  arteries 05/15/2017  ? Poor memory 05/15/2017  ? TB lung, latent 04/16/2017  ? Lung nodules 04/16/2017  ? Diabetes mellitus screening 11/19/2016  ? Fibromyalgia 05/15/2015  ? Rheumatoid arthritis ([place]) 08/22/2014  ? HTN (hypertension) 08/22/2014  ? Tobacco abuse 08/22/2014  ? Hepatic cirrhosis ([place]) 05/31/2014  ? Chronic hepatitis C virus infection ([place]) 05/03/2014  ? ? ?ONSET DATE: " about 5 months" Referral Date: 11-27-21 ? ?REFERRING DIAG: R13.10 (ICD-10-CM) - Dysphagia  ? ?THERAPY DIAG:  ?Dysphagia, unspecified type ? ?Other voice and resonance disorders ? ?SUBJECTIVE:  ? ?SUBJECTIVE STATEMENT: ?"I have problems with pain in my throat" ?Pt accompanied by: self ? ?PERTINENT HISTORY: "Makeshia Seat is a 66 y.o. female who presents as a new consult, referred by Scarlette Slice*, for head neck cancer surveillance. 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 She is status post treatment with primary chemoradiation therapy, which was completed on 09/25/2020. She had a posttreatment PET scan in June 2022, which did not reveal any residual hypermetabolic supraglottic tissue or hypermetabolic activity in cervical lymph nodes. She has not been seen by ENT since June 2022. She had been followed by Dr. Lucia Gaskins, who retired. Patient endorses significant difficulty with oral intake and a marked weight loss.  She currently weighs 94 pounds, and weighed close to 160 pounds prior to beginning treatment. She reports significant odynophagia and dysphagia. She has not recently had a swallow study. She had been followed by speech-language pathology at the cancer center, and had been given exercises to continue, which she states that she tries to do routinely. She also had been seen by nutrition, but states she is unable to follow their recommendations due to pain. She currently wears upper and lower dentures, which she acknowledges are poor fitting. She is scheduled to have new  dentures fitted next month. She continues to smoke about 5 cigarettes/day." ? ?PAIN:  ?Are you having pain? Yes: NPRS scale: pt unable to rate/10 ?Pain location: upper throat ?Pain description: "any time I swallow, it hurts" ?Aggravating factors: swallowing, talking  ?Relieving factors: Lidocaine/Biotene spray ? ?FALLS: Has patient fallen in last 6 months?  No ? ?LIVING ENVIRONMENT: ?Lives with: lives with their family ?Lives in: House/apartment ? ?PLOF:  ?Level of assistance: Independent with ADLs, Independent with IADLs ?Employment: Retired ? ? ?PATIENT GOALS "to not be in pain when I swallow" ? ?OBJECTIVE:  ? ?DIAGNOSTIC FINDINGS:  ? ?(11-27-21) Barium Swallow: ?"Cervical esophagram:Laryngeal penetration without intratracheal aspiration. No evidence of stricture or obvious mucosal abnormality. Small bilateral outpouching in the lower cervical esophagus which may represent a small diverticulum. ?Thoracic esophagram: Moderate-severe esophageal dysmotility. No evidence of hiatal hernia or gastroesophageal reflux. No obvious mucosal abnormality on limited single contrast exam." ? ? ?(11/22/21) Flexible Laryngoscopy Procedure: ?"After adequate topical anesthetic was applied, 4 mm flexible laryngoscope was passed through the nasal cavity without difficulty. ?Flexible laryngoscopy shows patent anterior nasal cavity with minimal crusting, no discharge or infection. Copious clear secretions noted, which somewhat limited exam. Normal base of tongue and supraglottis. Mild postradiation edema appreciated. Normal vocal cord mobility without vocal cord nodule, mass, polyp or tumor. Hypopharynx normal without mass. Pooling of secretions appreciated, with no obvious aspiration. Patient tolerated procedure without complication or difficulty." ? ?  ? ?RECOMMENDATIONS FROM OBJECTIVE SWALLOW STUDY (MBSS/FEES):  recommend MBSS to evaluate oral/pharyngeal mechanisms ?Objective swallow impairments: to be determined  ?Objective  recommended compensations: to be determined ? ?COGNITION: ?Overall cognitive status: Difficulty to assess ?Comments: limited recall and carryover of HEP from last ST intervention, difficulty recalling recent medical team/information   ? ?CLINICAL SWALLOW ASSESSMENT:   ?Current diet: Dysphagia 2 (chopped/minced) and thin liquids (pt electing to eat softer solids - mashed potatoes/green beans; Equate Plus ~4 day)  ?Dentition: dentures (top and bottom, now looser d/t weight loss ) ?Patient directly observed with POs: Yes: dysphagia 1 (puree) and thin liquids  (pt declined dysphagia 2/3 due to pain) ?Feeding: able to feed self ?Liquids provided by: cup ?Oral phase signs and symptoms:  N/A  -good oral clearance/cohesion, seemingly timely AP transport ?Pharyngeal phase signs and symptoms: multiple swallows, delayed throat clear, delayed cough, and c/o pain ?Voice: hoarseness, strain, roughness ? ? ? PATIENT REPORTED OUTCOME MEASURES (PROM): ?EAT-10: 33  (severe problem=lose weight, painful, pleasure of eating affected, swallowing is stressful) ? ? ?TODAY'S TREATMENT:  ?See "clinical impression" for clinical observations, analysis, and recommendations   ? ? ?PATIENT EDUCATION: ?Education details: see above  ?Person educated: Patient ?Education method: Explanation, Demonstration, and Handouts ?Education comprehension: verbalized understanding, returned demonstration, and needs further education ? ? ?ASSESSMENT: ? ?CLINICAL IMPRESSION: ?Patient is a 66 y.o. female who was seen today for dysphagia. Prior hx of squamous cell carcinoma of the supraglottic larynx and COPD. She is status post treatment, with  chemoradiation therapy completed on 09/25/2020. Primary complaint of odynophagia (for saliva and all PO) resulting in significant weight loss and limited PO intake. Pt is currently managing pain with Lidocaine (rare usage due to "so thick I'm afraid I'll gag or choke") and Biotene (3-4x/day). Previously treated by cancer  center SLP with recommendation for swallow exercise HEP with limited carryover of HEP documented at that time. Pt reported to ENT "routine" completion of swallow exercises HEP from last ST intervention, in which pt described

## 2021-12-03 ENCOUNTER — Other Ambulatory Visit (HOSPITAL_COMMUNITY): Payer: Self-pay | Admitting: *Deleted

## 2021-12-03 DIAGNOSIS — R1310 Dysphagia, unspecified: Secondary | ICD-10-CM

## 2021-12-03 IMAGING — DX DG CHEST 1V PORT
1 series · 2 of 2 positions shown · non-contrast
Comparison: Chest CT January 20, 2020.

CLINICAL DATA: Patient with supraglottic cancer with failure to
thrive.

EXAM:
PORTABLE CHEST 1 VIEW

[Series 1: chest ap · 0.14mm/px · 2 of 2 slices shown]
[im 1/2]
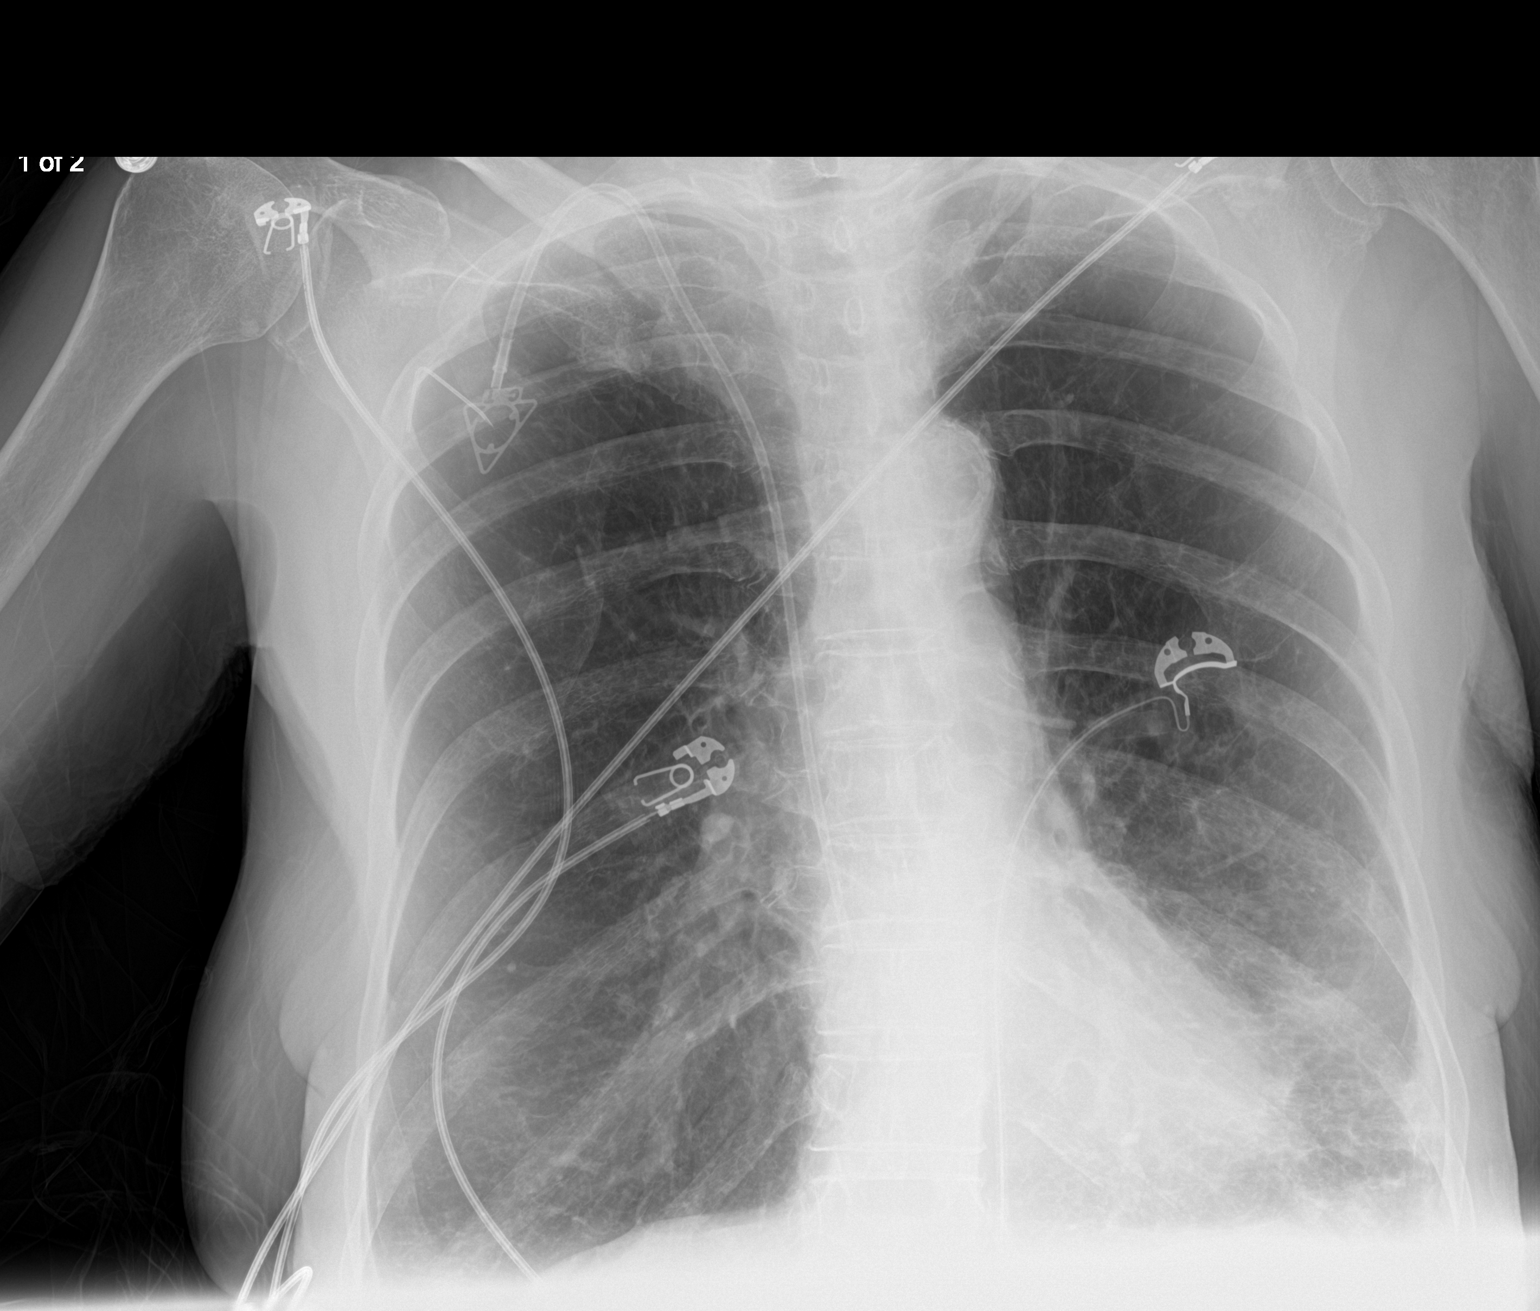
[im 2/2]
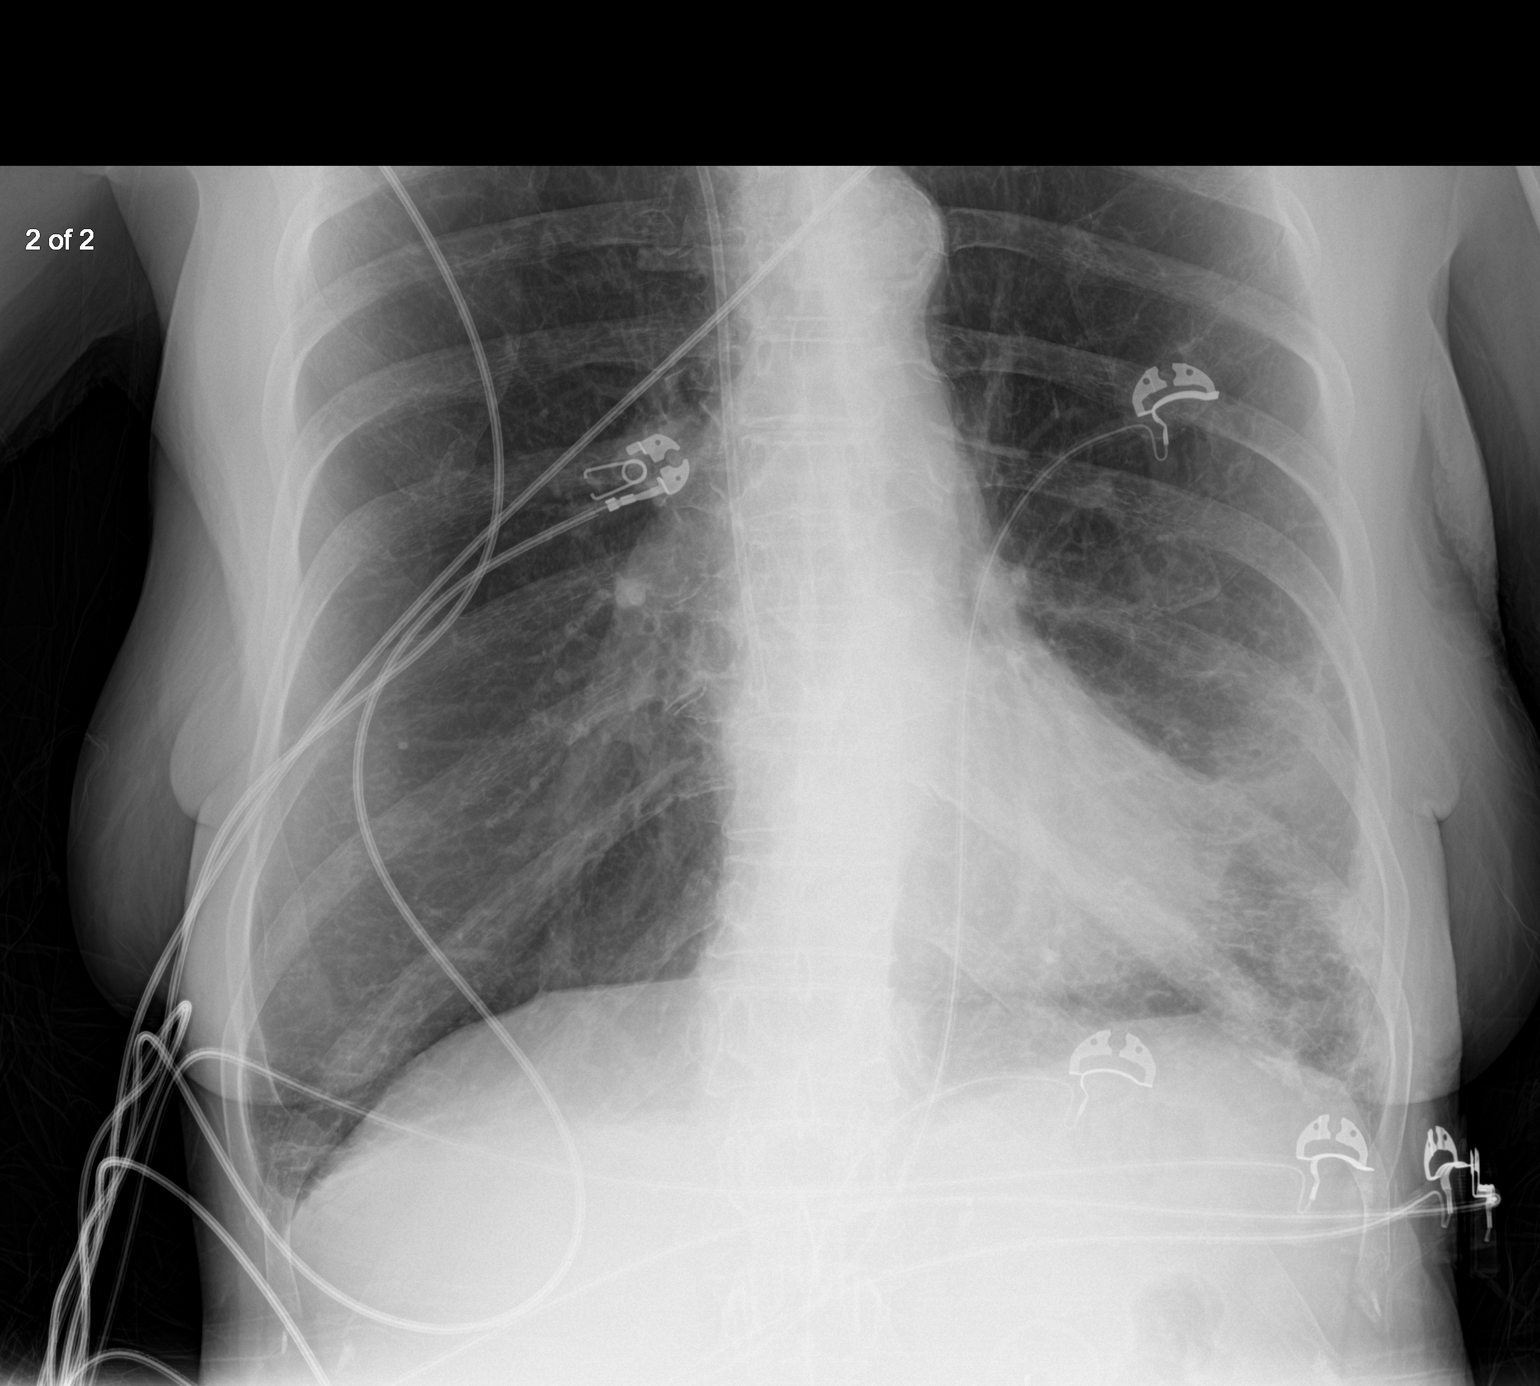

[2 of 2 positions shown; findings below may reference images not displayed]

FINDINGS: Accessed right chest porta catheter with tip overlying the superior
cavoatrial junction. The heart size and mediastinal contours are
within normal limits. Aortic atherosclerosis. Emphysematous lung
changes. Opacity in the left lung base. The visualized skeletal
structures are unremarkable.
IMPRESSION: 1. Opacity in the left lung base which could represent atelectasis
versus pneumonia.
2. Aortic Atherosclerosis ([id number]) and Emphysema
([id number]).

## 2021-12-14 ENCOUNTER — Ambulatory Visit (HOSPITAL_COMMUNITY)
Admission: RE | Admit: 2021-12-14 | Discharge: 2021-12-14 | Disposition: A | Discharge status: Home / Self Care | Payer: Medicare Other | Source: Ambulatory Visit | Attending: Otolaryngology | Admitting: Otolaryngology

## 2021-12-14 DIAGNOSIS — R1313 Dysphagia, pharyngeal phase: Secondary | ICD-10-CM | POA: Diagnosis not present

## 2021-12-14 DIAGNOSIS — R1310 Dysphagia, unspecified: Secondary | ICD-10-CM

## 2021-12-14 DIAGNOSIS — R498 Other voice and resonance disorders: Secondary | ICD-10-CM

## 2021-12-19 ENCOUNTER — Ambulatory Visit: Payer: Medicare Other | Attending: Otolaryngology | Admitting: Speech Pathology

## 2021-12-19 DIAGNOSIS — R1310 Dysphagia, unspecified: Secondary | ICD-10-CM | POA: Insufficient documentation

## 2021-12-19 NOTE — Therapy (Deleted)
OUTPATIENT SPEECH LANGUAGE PATHOLOGY DYSPHAGIA TREATMENT NOTE   Patient Name: Raven Torres MRN: [id number] DOB:February 16, 1956, 66 y.o., female Today's Date: 12/19/2021  PCP: *** REFERRING PROVIDER: ***  END OF SESSION:    Past Medical History:  Diagnosis Date   Arthritis    Cataract    removed both eyes    Colitis    COPD (chronic obstructive pulmonary disease) ([place])    Hepatitis C    Hyperlipidemia    Hypertension    Shingles 03/27/2020   TB (tuberculosis)    treatment    Past Surgical History:  Procedure Laterality Date   ABDOMINAL HYSTERECTOMY     CATARACT EXTRACTION, BILATERAL     COLONOSCOPY     COLONOSCOPY  04/04/2020   DIRECT LARYNGOSCOPY N/A 06/26/2020   Procedure: DIRECT LARYNGOSCOPY AND BIOPSY;  Surgeon: Rozetta Nunnery, MD;  Location: [facility];  Service: ENT;  Laterality: N/A;   IR GASTROSTOMY TUBE MOD SED  07/27/2020   IR GASTROSTOMY TUBE REMOVAL  04/17/2021   IR IMAGING GUIDED PORT INSERTION  07/27/2020   IR REMOVAL TUN ACCESS W/ PORT W/O FL MOD SED  03/20/2021   KNEE SURGERY Right    POLYPECTOMY  11/17/2008   HPP x 1    Patient Active Problem List   Diagnosis Date Noted   Protein-calorie malnutrition, severe 09/13/2020   Hypotension 09/12/2020   Tobacco dependence 08/11/2020   Malignant neoplasm of supraglottis ([place]) 07/13/2020   Herpes zoster without complication 84/53/6468   GOLD COPD III B 04/23/2018   Pruritic dermatitis 04/23/2018   High cholesterol 06/24/2017   Shortness of breath 06/04/2017   Coronary artery calcification 06/04/2017   Other fatigue 06/04/2017   Centrilobular emphysema ([place]) 05/15/2017   Atherosclerosis of arteries 05/15/2017   Poor memory 05/15/2017   TB lung, latent 04/16/2017   Lung nodules 04/16/2017   Diabetes mellitus screening 11/19/2016   Fibromyalgia 05/15/2015   Rheumatoid arthritis ([place]) 08/22/2014   HTN (hypertension) 08/22/2014   Tobacco abuse 08/22/2014   Hepatic cirrhosis ([place]) 05/31/2014   Chronic  hepatitis C virus infection ([place]) 05/03/2014    ONSET DATE: ***  REFERRING DIAG: ***  THERAPY DIAG:  Dysphagia, unspecified type  SUBJECTIVE: ***  PAIN:  Are you having pain? {yes/no:20286} NPRS scale: ***/10 Pain location: *** Pain orientation: {Pain Orientation:25161}  PAIN TYPE: {type:313116} Pain description: {PAIN DESCRIPTION:21022940}  Aggravating factors: *** Relieving factors: ***    OBJECTIVE:   RECOMMENDATIONS FROM OBJECTIVE SWALLOW STUDY (MBSS/FEES): Completed 12/14/2021 Objective swallow impairments: mild pharyngeal based dysphagia mostly c/b decreased motility resulting in vallecular retention with solids more than liquids.  In addition, impaired laryngeal closure with swallows allowed laryngeal penetration of thin and single episode of aspiration combining tablet and liquids. Objective recommended compensations: Chin tuck posture, effortful sallow, liquid wash, maintain strength of cough and expectoration for airway protection, avoid neck extension when taking pills, consider pudding for pill administration  TODAY'S TREATMENT:   DYSPHAGIA TREATMENT:   Current diet: {slpdiet:27196} Swallow precautions: {swallow compensations:27438} Patient directly observed with POs: {POobserved:27199} Feeding: {slp feeding:27200} Liquids provided by: {SLPliquids:27201} Oral phase signs and symptoms: {SLPoralphase:27202} Pharyngeal phase signs and symptoms: {SLPpharyngealphase:27203} Therapeutic exercises: {Therapeutic Exercises:27437} Types of cueing: {slpcueing:27204} Amount of cueing: {slpamountofcueing:27205} Treatment comments: ***    HOME EXERCISE PROGRAM: Exercises: {DYSPHAGIA HOME EXERCISES:27439}  PATIENT EDUCATION: Education details: see above  Person educated: Patient Education method: Explanation, Demonstration, and Handouts Education comprehension: verbalized understanding, returned demonstration, and needs further education     ASSESSMENT:   CLINICAL  IMPRESSION:  Patient is a 66 y.o. female who was seen today for dysphagia. Prior hx of squamous cell carcinoma of the supraglottic larynx and COPD. She is status post treatment, with chemoradiation therapy completed on 09/25/2020. Primary complaint of odynophagia (for saliva and all PO) resulting in significant weight loss and limited PO intake. Pt is currently managing pain with Lidocaine (rare usage due to "so thick I'm afraid I'll gag or choke") and Biotene (3-4x/day). Previously treated by cancer center SLP with recommendation for swallow exercise HEP with limited carryover of HEP documented at that time. Pt reported to ENT "routine" completion of swallow exercises HEP from last ST intervention, in which pt described today as tongue press and neck/jaw mobility. Pt endorsed misplacement of HEP with only mild awareness exhibited when SLP briefly discussed actual targeted exercises (effortful, Masako, Mendelsohn). Usual coughing/throat clearing and significant hoarseness/roughness/strain exhibited in conversation with intermittent expectoration of clear to yellow phlegm. Today, pt consumed puree, per patient election, and thin liquids via cup with seemingly good oral control, A-P propulsion, and seemingly timely swallow initiations with occasional delayed throat clear (again cough/throat clear present at baseline). Limited PO trials conducted due to pain reported. Pt is not consistently taking medications (capsules, tablets) due to pain related to size or discomfort even when cutting in half. SLP recommended MBSS to evaluate oral and pharyngeal functioning and assess for risk of aspiration. SLP discussed possible interventions following MBSS including individualized swallow exercise rehabilitation, in which pt voiced hesitancy to perform due to pain and/or buy-in. Palpation of neck did reveal tightness near larynx. Pt may benefit from voice/laryngeal exercises to address suspected muscle tension given hoarseness and  possible over-compensation related to pain.    OBJECTIVE IMPAIRMENTS include voice disorder and dysphagia. These impairments are limiting patient from effectively communicating at home and in community and safety when swallowing. Factors affecting potential to achieve goals and functional outcome are cooperation/participation level, medical prognosis, pain level, and previous level of function. Patient will benefit from skilled SLP services to address above impairments and improve overall function.   REHAB POTENTIAL: Fair -prior carryover of HEP, hesitancy to initiate swallow rehabilitation      GOALS: Goals reviewed with patient? Yes   SHORT TERM GOALS: Target date: 12/28/2021   Pt will participate in objective swallow study to evaluate pharyngeal function and assess for risk of aspiration  Baseline: Goal status: INITIAL   2.  Pt will demonstrate and carryover recommended voice and/or swallow exercises given occasional mod A over 3 sessions  Baseline:  Goal status: INITIAL   3.  Pt will demonstrate and carryover recommended compensatory voice and/or swallow strategies given occasional min A over 2 sessions  Baseline:  Goal status: INITIAL     LONG TERM GOALS: Target date: 02/22/2022   Pt will carryover recommended voice and/or swallow exercise HEP given occasional min A > 2 weeks Baseline:  Goal status: INITIAL   2.  Pt will carryover recommended compensatory voice and/or swallow strategies given rare min A > 2 weeks Baseline:  Goal status: INITIAL   3.  Pt will indicate improvements in swallow function via EAT-10 PROM by last ST session  Baseline: 33 Goal status: INITIAL     PLAN: SLP FREQUENCY: 1-2x/week (will determine following MBSS)   SLP DURATION: 12 weeks (written longer to account for MBSS scheduling)   PLANNED INTERVENTIONS: Aspiration precaution training, Pharyngeal strengthening exercises, Diet toleration management , Environmental controls, Trials of upgraded  texture/liquids, Functional tasks, SLP instruction and feedback, Compensatory strategies, and  Patient/family education     Su Monks, [place] 12/19/2021, 8:06 AM

## 2022-01-08 ENCOUNTER — Ambulatory Visit: Payer: Medicare Other | Attending: Internal Medicine | Admitting: Internal Medicine

## 2022-01-08 ENCOUNTER — Encounter: Payer: Self-pay | Admitting: Internal Medicine

## 2022-01-08 VITALS — BP 129/78 | HR 87 | Temp 98.1°F | Resp 16 | Wt 96.3 lb

## 2022-01-08 DIAGNOSIS — F17200 Nicotine dependence, unspecified, uncomplicated: Secondary | ICD-10-CM | POA: Diagnosis not present

## 2022-01-08 DIAGNOSIS — Z85819 Personal history of malignant neoplasm of unspecified site of lip, oral cavity, and pharynx: Secondary | ICD-10-CM | POA: Diagnosis not present

## 2022-01-08 DIAGNOSIS — K7469 Other cirrhosis of liver: Secondary | ICD-10-CM

## 2022-01-08 DIAGNOSIS — Z7189 Other specified counseling: Secondary | ICD-10-CM | POA: Diagnosis not present

## 2022-01-08 DIAGNOSIS — Z2821 Immunization not carried out because of patient refusal: Secondary | ICD-10-CM | POA: Diagnosis not present

## 2022-01-08 DIAGNOSIS — J432 Centrilobular emphysema: Secondary | ICD-10-CM | POA: Diagnosis not present

## 2022-01-08 DIAGNOSIS — Z5320 Procedure and treatment not carried out because of patient's decision for unspecified reasons: Secondary | ICD-10-CM

## 2022-01-08 DIAGNOSIS — E43 Unspecified severe protein-calorie malnutrition: Secondary | ICD-10-CM | POA: Diagnosis not present

## 2022-01-08 NOTE — Progress Notes (Unsigned)
Patient ID: Raven Torres, female    DOB: [birth year]  MRN: [id number]  CC: chronic ds management  Subjective: Raven Torres is a 66 y.o. female who presents for chronic ds management Her concerns today include:  Patient with history of supraglottic laryngeal CA, HTN, cured hepatitis C with cirrhosis, adjustment disorder, HL, tobacco dependence, and rheumatoid arthritis/fibromyalgia (Dr. Posey Pronto at [facility]) , memory decline (MMSE 05/2019 was 30/30), COPD GOLD III, lung nodules (Dr. Vaughan Browner), coronary atherosclerosis   Patient Active Problem List   Diagnosis Date Noted   Protein-calorie malnutrition, severe 09/13/2020   Hypotension 09/12/2020   Tobacco dependence 08/11/2020   Malignant neoplasm of supraglottis ([place]) 07/13/2020   Herpes zoster without complication 21/19/4174   GOLD COPD III B 04/23/2018   Pruritic dermatitis 04/23/2018   High cholesterol 06/24/2017   Shortness of breath 06/04/2017   Coronary artery calcification 06/04/2017   Other fatigue 06/04/2017   Centrilobular emphysema ([place]) 05/15/2017   Atherosclerosis of arteries 05/15/2017   Poor memory 05/15/2017   Cigarette nicotine dependence without complication 03/18/4817   TB lung, latent 04/16/2017   Lung nodules 04/16/2017   Other cirrhosis of liver ([employer]) 01/22/2017   Long-term use of high-risk medication 12/31/2016   Rheumatoid arthritis involving multiple sites with positive rheumatoid factor ([place]) 12/31/2016   Diabetes mellitus screening 11/19/2016   Fibromyalgia 05/15/2015   Rheumatoid arthritis ([place]) 08/22/2014   HTN (hypertension) 08/22/2014   Tobacco abuse 08/22/2014   Hepatic cirrhosis ([place]) 05/31/2014   Chronic hepatitis C virus infection ([place]) 05/03/2014     Current Outpatient Medications on File Prior to Visit  Medication Sig Dispense Refill   albuterol (PROVENTIL) (2.5 MG/3ML) 0.083% nebulizer solution Take 3 mLs (2.5 mg total) by nebulization every 6 (six) hours as needed for wheezing or  shortness of breath. 150 mL 1   albuterol (VENTOLIN HFA) 108 (90 Base) MCG/ACT inhaler INHALE 2 PUFFS INTO THE LUNGS EVERY 6 HOURS AS NEEDED FOR WHEEZING OR SHORTNESS OF BREATH 8.5 g 3   BEVESPI AEROSPHERE 9-4.8 MCG/ACT AERO INHALE 2 PUFFS INTO THE LUNGS TWICE DAILY (Patient taking differently: Inhale 2 puffs into the lungs in the morning and at bedtime.) 10.7 g 1   lactose free nutrition (BOOST) LIQD Take 237 mLs by mouth 3 (three) times daily between meals.     lidocaine (XYLOCAINE) 2 % solution Patient: Mix 1part 2% viscous lidocaine, 1part H20. Swallow 84m of diluted mixture, 358m before meals and at bedtime, up to QID (Patient taking differently: Use as directed 15 mLs in the mouth or throat in the morning, at noon, in the evening, and at bedtime. Patient: Mix 1part 2% viscous lidocaine, 1part H20. Swallow 1011mf diluted mixture, 15m108mefore meals and at bedtime, up to QID) 200 mL 4   varenicline (CHANTIX STARTING MONTH PAK) 0.5 MG X 11 & 1 MG X 42 tablet Take one 0.5 mg tablet by mouth once daily for 3 days, then increase to one 0.5 mg tablet twice daily for 4 days, then increase to one 1 mg tablet twice daily. 53 tablet 0   No current facility-administered medications on file prior to visit.    Allergies  Allergen Reactions   Tudorza Pressair [Aclidinium Bromide] Itching   Amitriptyline Other (See Comments)    Eye spasms per pt   Codeine Nausea Only   Tramadol Nausea Only    Social History   Socioeconomic History   Marital status: Married    Spouse name: Not on file   Number  of children: Not on file   Years of education: Not on file   Highest education level: Not on file  Occupational History   Not on file  Tobacco Use   Smoking status: Every Day    Packs/day: 1.00    Years: 51.00    Pack years: 51.00    Types: Cigarettes    Start date: 80   Smokeless tobacco: Never   Tobacco comments:    4 cigs a day   Vaping Use   Vaping Use: Never used  Substance and Sexual  Activity   Alcohol use: No    Alcohol/week: 0.0 standard drinks   Drug use: No   Sexual activity: Not Currently  Other Topics Concern   Not on file  Social History Narrative   Not on file   Social Determinants of Health   Financial Resource Strain: Not on file  Food Insecurity: Not on file  Transportation Needs: Not on file  Physical Activity: Not on file  Stress: Not on file  Social Connections: Not on file  Intimate Partner Violence: Not on file    Family History  Problem Relation Age of Onset   Heart disease Mother    Cancer Mother    Melanoma Mother    Esophageal cancer Father    Colon cancer Neg Hx    Colon polyps Neg Hx    Rectal cancer Neg Hx    Stomach cancer Neg Hx     Past Surgical History:  Procedure Laterality Date   ABDOMINAL HYSTERECTOMY     CATARACT EXTRACTION, BILATERAL     COLONOSCOPY     COLONOSCOPY  04/04/2020   DIRECT LARYNGOSCOPY N/A 06/26/2020   Procedure: DIRECT LARYNGOSCOPY AND BIOPSY;  Surgeon: Rozetta Nunnery, MD;  Location: [place];  Service: ENT;  Laterality: N/A;   IR GASTROSTOMY TUBE MOD SED  07/27/2020   IR GASTROSTOMY TUBE REMOVAL  04/17/2021   IR IMAGING GUIDED PORT INSERTION  07/27/2020   IR REMOVAL TUN ACCESS W/ PORT W/O FL MOD SED  03/20/2021   KNEE SURGERY Right    POLYPECTOMY  11/17/2008   HPP x 1     ROS: Review of Systems Negative except as stated above  PHYSICAL EXAM: BP 129/78   Pulse 87   Temp 98.1 F (36.7 C) (Oral)   Resp 16   Wt 96 lb 4.8 oz (43.7 kg)   SpO2 93%   BMI 16.03 kg/m   Wt Readings from Last 3 Encounters:  01/08/22 96 lb 4.8 oz (43.7 kg)  07/19/21 104 lb 3 oz (47.3 kg)  04/13/21 111 lb (50.3 kg)    Physical Exam  {female adult master:310786} {female adult master:310785}     Latest Ref Rng & Units 07/19/2021   12:50 PM 02/08/2021   12:01 PM 10/10/2020    3:45 PM  CMP  Glucose 70 - 99 mg/dL 86   99   109    BUN 8 - 23 mg/dL '15   16   16    '$ Creatinine 0.44 - 1.00 mg/dL 0.79   0.78    0.75    Sodium 135 - 145 mmol/L 141   140   139    Potassium 3.5 - 5.1 mmol/L 4.0   4.7   4.5    Chloride 98 - 111 mmol/L 103   102   103    CO2 22 - 32 mmol/L '29   30   26    '$ Calcium 8.9 - 10.3 mg/dL  9.4   9.4   9.5    Total Protein 6.5 - 8.1 g/dL  6.8     Total Bilirubin 0.3 - 1.2 mg/dL  0.4     Alkaline Phos 38 - 126 U/L  65     AST 15 - 41 U/L  27     ALT 0 - 44 U/L  12      Lipid Panel     Component Value Date/Time   CHOL 133 06/02/2020 0845   TRIG 84 06/02/2020 0845   HDL 67 06/02/2020 0845   CHOLHDL 2.0 06/02/2020 0845   CHOLHDL 2.5 02/26/2016 1132   VLDL 28 02/26/2016 1132   LDLCALC 50 06/02/2020 0845    CBC    Component Value Date/Time   WBC 5.8 07/19/2021 1250   WBC 5.3 02/08/2021 1201   RBC 4.53 07/19/2021 1250   HGB 13.5 07/19/2021 1250   HGB 13.8 06/02/2020 0845   HCT 42.4 07/19/2021 1250   HCT 39.7 06/02/2020 0845   PLT 123 (L) 07/19/2021 1250   PLT 114 (L) 06/02/2020 0845   MCV 93.6 07/19/2021 1250   MCV 92 06/02/2020 0845   MCH 29.8 07/19/2021 1250   MCHC 31.8 07/19/2021 1250   RDW 14.2 07/19/2021 1250   RDW 13.0 06/02/2020 0845   LYMPHSABS 1.1 07/19/2021 1250   LYMPHSABS 2.9 11/19/2016 1223   MONOABS 0.5 07/19/2021 1250   EOSABS 0.2 07/19/2021 1250   EOSABS 0.2 11/19/2016 1223   BASOSABS 0.0 07/19/2021 1250   BASOSABS 0.0 11/19/2016 1223    ASSESSMENT AND PLAN:  There are no diagnoses linked to this encounter.   Patient was given the opportunity to ask questions.  Patient verbalized understanding of the plan and was able to repeat key elements of the plan.   This documentation was completed using Radio producer.  Any transcriptional errors are unintentional.  No orders of the defined types were placed in this encounter.    Requested Prescriptions    No prescriptions requested or ordered in this encounter    No follow-ups on file.  Karle Plumber, MD, FACP

## 2022-01-08 NOTE — Progress Notes (Unsigned)
Patient is here for follow-up weight loss. Patient also c/o lack of energy due  to weight loss. No other concerns for provider today

## 2022-01-09 MED ORDER — BEVESPI AEROSPHERE 9-4.8 MCG/ACT IN AERO: 2.0000 | INHALATION_SPRAY | Freq: Two times a day (BID) | RESPIRATORY_TRACT | 5 refills | Status: DC

## 2022-01-17 ENCOUNTER — Ambulatory Visit: Payer: Medicare Other | Admitting: Hematology and Oncology

## 2022-01-22 ENCOUNTER — Telehealth: Payer: Self-pay | Admitting: *Deleted

## 2022-01-22 ENCOUNTER — Ambulatory Visit: Payer: Medicare Other | Attending: Radiation Oncology | Admitting: Radiation Oncology

## 2022-01-22 NOTE — Telephone Encounter (Signed)
Called patient to inquire if she would be keeping her fu today, lvm for a return call

## 2022-01-24 ENCOUNTER — Ambulatory Visit: Payer: Medicare Other | Admitting: Hematology and Oncology

## 2022-01-24 NOTE — Progress Notes (Signed)
Oncology Nurse Navigator Documentation   I was asked by Dr. Isidore Moos to get Raven Torres in to be seen at my next head and neck multidisciplinary clinic on 02/07/22. Per notes from her recent visit with Dr. Fredric Dine she is struggling with swallowing, experiencing weight loss, and deconditioning and Dr. Isidore Moos thought seeing SLP, PT, and nutrition would provide some benefit. I called Ms. Pfeffer and left a voice mail asking for a call back to see if she was willing to come. I also told her that Dr. Isidore Moos would like to see her in late July for examination. I provided my direct contact information for her to call back.  Harlow Asa RN, BSN, OCN Head & Neck Oncology Nurse [place] at [facility] Phone # [phone]  Fax # [phone]

## 2022-01-29 NOTE — Progress Notes (Signed)
Oncology Nurse Navigator Documentation   I left another voice mail with Raven Torres asking her to call me back to discuss how she is doing. I then called and spoke to her daughter, Raven Torres. She told me that her mom is not feeling well and having difficulty breathing at times. She has encouraged her to go to the Emergency Dept but [name] has declined. I voiced to Raven Torres that I thought it was appropriate for her to present to the ED as well and she plans to tell [name] my recommendations. She plans to see Dr. Al Pimple on 6/29 for a scheduled follow up. Raven Torres would like to be present on speaker phone if possible. At this time Raven Torres doesn't think her mom will agree to seeing PT, SLP, and nutrition next week due to her breathing difficulties but I will call her again on Monday to see how she is doing.   Hedda Slade RN, BSN, OCN Head & Neck Oncology Nurse Navigator [facility] at [facility] Phone # [phone]  Fax # [phone]

## 2022-01-30 ENCOUNTER — Inpatient Hospital Stay (HOSPITAL_COMMUNITY)
Admission: EM | Admit: 2022-01-30 | Discharge: 2022-02-07 | DRG: 177 | Disposition: A | Discharge status: Skilled Nursing Facility | Payer: Medicare Other | Attending: Internal Medicine | Admitting: Internal Medicine

## 2022-01-30 ENCOUNTER — Other Ambulatory Visit: Payer: Self-pay

## 2022-01-30 ENCOUNTER — Encounter (HOSPITAL_COMMUNITY): Payer: Self-pay

## 2022-01-30 ENCOUNTER — Emergency Department (HOSPITAL_COMMUNITY): Payer: Medicare Other

## 2022-01-30 DIAGNOSIS — Q2112 Patent foramen ovale: Secondary | ICD-10-CM

## 2022-01-30 DIAGNOSIS — R52 Pain, unspecified: Secondary | ICD-10-CM | POA: Diagnosis not present

## 2022-01-30 DIAGNOSIS — E43 Unspecified severe protein-calorie malnutrition: Secondary | ICD-10-CM | POA: Diagnosis not present

## 2022-01-30 DIAGNOSIS — I110 Hypertensive heart disease with heart failure: Secondary | ICD-10-CM | POA: Diagnosis present

## 2022-01-30 DIAGNOSIS — Z923 Personal history of irradiation: Secondary | ICD-10-CM | POA: Diagnosis not present

## 2022-01-30 DIAGNOSIS — E870 Hyperosmolality and hypernatremia: Secondary | ICD-10-CM | POA: Diagnosis present

## 2022-01-30 DIAGNOSIS — I672 Cerebral atherosclerosis: Secondary | ICD-10-CM | POA: Diagnosis not present

## 2022-01-30 DIAGNOSIS — J9601 Acute respiratory failure with hypoxia: Secondary | ICD-10-CM | POA: Diagnosis not present

## 2022-01-30 DIAGNOSIS — J189 Pneumonia, unspecified organism: Secondary | ICD-10-CM | POA: Diagnosis not present

## 2022-01-30 DIAGNOSIS — Z720 Tobacco use: Secondary | ICD-10-CM | POA: Diagnosis present

## 2022-01-30 DIAGNOSIS — R0689 Other abnormalities of breathing: Secondary | ICD-10-CM | POA: Diagnosis not present

## 2022-01-30 DIAGNOSIS — J9621 Acute and chronic respiratory failure with hypoxia: Secondary | ICD-10-CM | POA: Diagnosis not present

## 2022-01-30 DIAGNOSIS — Z7189 Other specified counseling: Secondary | ICD-10-CM

## 2022-01-30 DIAGNOSIS — R0602 Shortness of breath: Secondary | ICD-10-CM | POA: Diagnosis not present

## 2022-01-30 DIAGNOSIS — F17210 Nicotine dependence, cigarettes, uncomplicated: Secondary | ICD-10-CM | POA: Diagnosis present

## 2022-01-30 DIAGNOSIS — R1310 Dysphagia, unspecified: Secondary | ICD-10-CM | POA: Diagnosis present

## 2022-01-30 DIAGNOSIS — Z66 Do not resuscitate: Secondary | ICD-10-CM | POA: Diagnosis present

## 2022-01-30 DIAGNOSIS — Z79899 Other long term (current) drug therapy: Secondary | ICD-10-CM

## 2022-01-30 DIAGNOSIS — L8990 Pressure ulcer of unspecified site, unspecified stage: Secondary | ICD-10-CM | POA: Diagnosis present

## 2022-01-30 DIAGNOSIS — R29705 NIHSS score 5: Secondary | ICD-10-CM | POA: Diagnosis not present

## 2022-01-30 DIAGNOSIS — Z885 Allergy status to narcotic agent status: Secondary | ICD-10-CM

## 2022-01-30 DIAGNOSIS — G8194 Hemiplegia, unspecified affecting left nondominant side: Secondary | ICD-10-CM | POA: Diagnosis not present

## 2022-01-30 DIAGNOSIS — Z7401 Bed confinement status: Secondary | ICD-10-CM | POA: Diagnosis not present

## 2022-01-30 DIAGNOSIS — D6869 Other thrombophilia: Secondary | ICD-10-CM | POA: Diagnosis not present

## 2022-01-30 DIAGNOSIS — Z681 Body mass index (BMI) 19 or less, adult: Secondary | ICD-10-CM | POA: Diagnosis not present

## 2022-01-30 DIAGNOSIS — I639 Cerebral infarction, unspecified: Secondary | ICD-10-CM | POA: Diagnosis not present

## 2022-01-30 DIAGNOSIS — L89151 Pressure ulcer of sacral region, stage 1: Secondary | ICD-10-CM | POA: Diagnosis not present

## 2022-01-30 DIAGNOSIS — I700 Atherosclerosis of aorta: Secondary | ICD-10-CM | POA: Diagnosis not present

## 2022-01-30 DIAGNOSIS — I499 Cardiac arrhythmia, unspecified: Secondary | ICD-10-CM | POA: Diagnosis not present

## 2022-01-30 DIAGNOSIS — E7800 Pure hypercholesterolemia, unspecified: Secondary | ICD-10-CM | POA: Diagnosis not present

## 2022-01-30 DIAGNOSIS — I6502 Occlusion and stenosis of left vertebral artery: Secondary | ICD-10-CM | POA: Diagnosis not present

## 2022-01-30 DIAGNOSIS — Z8249 Family history of ischemic heart disease and other diseases of the circulatory system: Secondary | ICD-10-CM

## 2022-01-30 DIAGNOSIS — R64 Cachexia: Secondary | ICD-10-CM | POA: Diagnosis not present

## 2022-01-30 DIAGNOSIS — I63421 Cerebral infarction due to embolism of right anterior cerebral artery: Secondary | ICD-10-CM | POA: Diagnosis not present

## 2022-01-30 DIAGNOSIS — I69391 Dysphagia following cerebral infarction: Secondary | ICD-10-CM | POA: Diagnosis not present

## 2022-01-30 DIAGNOSIS — C321 Malignant neoplasm of supraglottis: Secondary | ICD-10-CM | POA: Diagnosis not present

## 2022-01-30 DIAGNOSIS — I6389 Other cerebral infarction: Secondary | ICD-10-CM | POA: Diagnosis not present

## 2022-01-30 DIAGNOSIS — Z743 Need for continuous supervision: Secondary | ICD-10-CM | POA: Diagnosis not present

## 2022-01-30 DIAGNOSIS — R918 Other nonspecific abnormal finding of lung field: Secondary | ICD-10-CM | POA: Diagnosis present

## 2022-01-30 DIAGNOSIS — I5031 Acute diastolic (congestive) heart failure: Secondary | ICD-10-CM | POA: Diagnosis not present

## 2022-01-30 DIAGNOSIS — Z8619 Personal history of other infectious and parasitic diseases: Secondary | ICD-10-CM

## 2022-01-30 DIAGNOSIS — I69354 Hemiplegia and hemiparesis following cerebral infarction affecting left non-dominant side: Secondary | ICD-10-CM | POA: Diagnosis not present

## 2022-01-30 DIAGNOSIS — Z8521 Personal history of malignant neoplasm of larynx: Secondary | ICD-10-CM

## 2022-01-30 DIAGNOSIS — Z888 Allergy status to other drugs, medicaments and biological substances status: Secondary | ICD-10-CM

## 2022-01-30 DIAGNOSIS — R509 Fever, unspecified: Secondary | ICD-10-CM | POA: Diagnosis not present

## 2022-01-30 DIAGNOSIS — R1312 Dysphagia, oropharyngeal phase: Secondary | ICD-10-CM | POA: Diagnosis not present

## 2022-01-30 DIAGNOSIS — B348 Other viral infections of unspecified site: Secondary | ICD-10-CM | POA: Diagnosis not present

## 2022-01-30 DIAGNOSIS — R2689 Other abnormalities of gait and mobility: Secondary | ICD-10-CM | POA: Diagnosis not present

## 2022-01-30 DIAGNOSIS — R059 Cough, unspecified: Secondary | ICD-10-CM | POA: Diagnosis not present

## 2022-01-30 DIAGNOSIS — Z800 Family history of malignant neoplasm of digestive organs: Secondary | ICD-10-CM

## 2022-01-30 DIAGNOSIS — Z9221 Personal history of antineoplastic chemotherapy: Secondary | ICD-10-CM

## 2022-01-30 DIAGNOSIS — R627 Adult failure to thrive: Secondary | ICD-10-CM | POA: Diagnosis not present

## 2022-01-30 DIAGNOSIS — R404 Transient alteration of awareness: Secondary | ICD-10-CM | POA: Diagnosis not present

## 2022-01-30 DIAGNOSIS — R069 Unspecified abnormalities of breathing: Secondary | ICD-10-CM | POA: Diagnosis not present

## 2022-01-30 DIAGNOSIS — R29810 Facial weakness: Secondary | ICD-10-CM | POA: Diagnosis not present

## 2022-01-30 DIAGNOSIS — I69891 Dysphagia following other cerebrovascular disease: Secondary | ICD-10-CM | POA: Diagnosis not present

## 2022-01-30 DIAGNOSIS — J432 Centrilobular emphysema: Secondary | ICD-10-CM | POA: Diagnosis present

## 2022-01-30 DIAGNOSIS — R54 Age-related physical debility: Secondary | ICD-10-CM | POA: Diagnosis not present

## 2022-01-30 DIAGNOSIS — L89152 Pressure ulcer of sacral region, stage 2: Secondary | ICD-10-CM | POA: Diagnosis not present

## 2022-01-30 DIAGNOSIS — J690 Pneumonitis due to inhalation of food and vomit: Principal | ICD-10-CM | POA: Diagnosis present

## 2022-01-30 DIAGNOSIS — R0600 Dyspnea, unspecified: Secondary | ICD-10-CM | POA: Diagnosis not present

## 2022-01-30 DIAGNOSIS — J441 Chronic obstructive pulmonary disease with (acute) exacerbation: Secondary | ICD-10-CM | POA: Diagnosis not present

## 2022-01-30 DIAGNOSIS — I63233 Cerebral infarction due to unspecified occlusion or stenosis of bilateral carotid arteries: Secondary | ICD-10-CM | POA: Diagnosis not present

## 2022-01-30 DIAGNOSIS — N200 Calculus of kidney: Secondary | ICD-10-CM | POA: Diagnosis not present

## 2022-01-30 DIAGNOSIS — Q283 Other malformations of cerebral vessels: Secondary | ICD-10-CM | POA: Diagnosis not present

## 2022-01-30 DIAGNOSIS — Z515 Encounter for palliative care: Secondary | ICD-10-CM | POA: Diagnosis not present

## 2022-01-30 DIAGNOSIS — Z808 Family history of malignant neoplasm of other organs or systems: Secondary | ICD-10-CM

## 2022-01-30 DIAGNOSIS — I2510 Atherosclerotic heart disease of native coronary artery without angina pectoris: Secondary | ICD-10-CM | POA: Diagnosis not present

## 2022-01-30 DIAGNOSIS — I69328 Other speech and language deficits following cerebral infarction: Secondary | ICD-10-CM | POA: Diagnosis not present

## 2022-01-30 DIAGNOSIS — I69828 Other speech and language deficits following other cerebrovascular disease: Secondary | ICD-10-CM | POA: Diagnosis not present

## 2022-01-30 DIAGNOSIS — M6281 Muscle weakness (generalized): Secondary | ICD-10-CM | POA: Diagnosis not present

## 2022-01-30 LAB — COMPREHENSIVE METABOLIC PANEL
ALT: 12 U/L (ref 0–44)
AST: 28 U/L (ref 15–41)
Albumin: 2.2 g/dL — ABNORMAL LOW (ref 3.5–5.0)
Alkaline Phosphatase: 39 U/L (ref 38–126)
Anion gap: 6 (ref 5–15)
BUN: 18 mg/dL (ref 8–23)
CO2: 26 mmol/L (ref 22–32)
Calcium: 6.7 mg/dL — ABNORMAL LOW (ref 8.9–10.3)
Chloride: 115 mmol/L — ABNORMAL HIGH (ref 98–111)
Creatinine, Ser: 0.46 mg/dL (ref 0.44–1.00)
GFR, Estimated: >60 mL/min (ref >60)
Glucose, Bld: 112 mg/dL — ABNORMAL HIGH (ref 70–99)
Potassium: 3.5 mmol/L (ref 3.5–5.1)
Sodium: 147 mmol/L — ABNORMAL HIGH (ref 135–145)
Total Bilirubin: 1 mg/dL (ref 0.3–1.2)
Total Protein: 5.2 g/dL — ABNORMAL LOW (ref 6.5–8.1)

## 2022-01-30 LAB — CBC
HCT: 45.7 % (ref 36.0–46.0)
Hemoglobin: 14.1 g/dL (ref 12.0–15.0)
MCH: 30.2 pg (ref 26.0–34.0)
MCHC: 30.9 g/dL (ref 30.0–36.0)
MCV: 97.9 fL (ref 80.0–100.0)
Platelets: 176 10*3/uL (ref 150–400)
RBC: 4.67 MIL/uL (ref 3.87–5.11)
RDW: 12.9 % (ref 11.5–15.5)
WBC: 10.8 10*3/uL — ABNORMAL HIGH (ref 4.0–10.5)
nRBC: 0 % (ref 0.0–0.2)

## 2022-01-30 LAB — BLOOD GAS, ARTERIAL
Acid-Base Excess: 16.7 mmol/L — ABNORMAL HIGH (ref 0.0–2.0)
Bicarbonate: 46.2 mmol/L — ABNORMAL HIGH (ref 20.0–28.0)
Drawn by: 54496
FIO2: 36 %
O2 Content: 4 L/min
O2 Saturation: 98.3 %
Patient temperature: 37
RATE: 94 resp/min
pCO2 arterial: 80 mmHg (ref 32–48)
pH, Arterial: 7.37 (ref 7.35–7.45)
pO2, Arterial: 87 mmHg (ref 83–108)

## 2022-01-30 LAB — RESPIRATORY PANEL BY PCR

## 2022-01-30 LAB — PROCALCITONIN: Procalcitonin: <0.1 ng/mL

## 2022-01-30 LAB — BLOOD GAS, VENOUS
Acid-base deficit: 11.2 mmol/L — ABNORMAL HIGH (ref 0.0–2.0)
Bicarbonate: 16.4 mmol/L — ABNORMAL LOW (ref 20.0–28.0)
O2 Saturation: 87.4 %
Patient temperature: 37
pCO2, Ven: 42 mmHg — ABNORMAL LOW (ref 44–60)
pH, Ven: 7.2 — ABNORMAL LOW (ref 7.25–7.43)
pO2, Ven: 57 mmHg — ABNORMAL HIGH (ref 32–45)

## 2022-01-30 LAB — MRSA NEXT GEN BY PCR, NASAL: MRSA by PCR Next Gen: NOT DETECTED

## 2022-01-30 MED ORDER — MELATONIN 5 MG PO TABS
5.0000 mg | ORAL_TABLET | ORAL | Status: AC
  Administered 2022-01-30: 5 mg via ORAL
  Filled 2022-01-30 (×2): qty 1

## 2022-01-30 MED ORDER — METHYLPREDNISOLONE SODIUM SUCC 125 MG IJ SOLR
60.0000 mg | Freq: Four times a day (QID) | INTRAMUSCULAR | Status: DC
  Administered 2022-01-30 – 2022-02-01 (×8): 60 mg via INTRAVENOUS
  Filled 2022-01-30 (×7): qty 2

## 2022-01-30 MED ORDER — LACTATED RINGERS IV BOLUS
500.0000 mL | Freq: Once | INTRAVENOUS | Status: AC
  Administered 2022-01-30: 500 mL via INTRAVENOUS

## 2022-01-30 MED ORDER — UMECLIDINIUM BROMIDE 62.5 MCG/ACT IN AEPB
1.0000 | INHALATION_SPRAY | Freq: Every day | RESPIRATORY_TRACT | Status: DC
  Administered 2022-02-01 – 2022-02-07 (×7): 1 via RESPIRATORY_TRACT
  Filled 2022-01-30: qty 7

## 2022-01-30 MED ORDER — ORAL CARE MOUTH RINSE: 15.0000 mL | OROMUCOSAL | Status: DC | PRN

## 2022-01-30 MED ORDER — ENOXAPARIN SODIUM 40 MG/0.4ML IJ SOSY
40.0000 mg | PREFILLED_SYRINGE | INTRAMUSCULAR | Status: DC
  Administered 2022-01-30 – 2022-02-06 (×8): 40 mg via SUBCUTANEOUS
  Filled 2022-01-30 (×8): qty 0.4

## 2022-01-30 MED ORDER — LIDOCAINE VISCOUS HCL 2 % MT SOLN
15.0000 mL | OROMUCOSAL | Status: DC | PRN
Start: 2022-01-30 — End: 2022-02-08

## 2022-01-30 MED ORDER — STERILE WATER FOR INJECTION IJ SOLN
INTRAMUSCULAR | Status: AC
  Administered 2022-01-30: 10 mL
  Filled 2022-01-30: qty 10

## 2022-01-30 MED ORDER — SODIUM CHLORIDE 0.9 % IV SOLN
1.0000 g | INTRAVENOUS | Status: DC
  Administered 2022-01-31 – 2022-02-03 (×4): 1 g via INTRAVENOUS
  Filled 2022-01-30 (×4): qty 10

## 2022-01-30 MED ORDER — IPRATROPIUM-ALBUTEROL 0.5-2.5 (3) MG/3ML IN SOLN
3.0000 mL | RESPIRATORY_TRACT | Status: DC
  Administered 2022-01-30: 3 mL via RESPIRATORY_TRACT
  Filled 2022-01-30: qty 3

## 2022-01-30 MED ORDER — BOOST PO LIQD
237.0000 mL | Freq: Three times a day (TID) | ORAL | Status: DC
Start: 2022-01-30 — End: 2022-01-31
  Filled 2022-01-30 (×2): qty 237

## 2022-01-30 MED ORDER — SODIUM CHLORIDE 0.9 % IV SOLN
1.0000 g | Freq: Once | INTRAVENOUS | Status: AC
  Administered 2022-01-30: 1 g via INTRAVENOUS
  Filled 2022-01-30: qty 10

## 2022-01-30 MED ORDER — ALBUTEROL SULFATE (2.5 MG/3ML) 0.083% IN NEBU
5.0000 mg | INHALATION_SOLUTION | Freq: Once | RESPIRATORY_TRACT | Status: AC
  Administered 2022-01-30: 5 mg via RESPIRATORY_TRACT
  Filled 2022-01-30: qty 6

## 2022-01-30 MED ORDER — ARFORMOTEROL TARTRATE 15 MCG/2ML IN NEBU
15.0000 ug | INHALATION_SOLUTION | Freq: Two times a day (BID) | RESPIRATORY_TRACT | Status: DC
  Administered 2022-01-30 – 2022-02-07 (×18): 15 ug via RESPIRATORY_TRACT
  Filled 2022-01-30 (×18): qty 2

## 2022-01-30 MED ORDER — IPRATROPIUM-ALBUTEROL 0.5-2.5 (3) MG/3ML IN SOLN: 3.0000 mL | Freq: Four times a day (QID) | RESPIRATORY_TRACT | Status: DC | PRN

## 2022-01-30 MED ORDER — IPRATROPIUM-ALBUTEROL 0.5-2.5 (3) MG/3ML IN SOLN
3.0000 mL | Freq: Three times a day (TID) | RESPIRATORY_TRACT | Status: DC
Start: 2022-01-30 — End: 2022-01-31
  Administered 2022-01-30 – 2022-01-31 (×4): 3 mL via RESPIRATORY_TRACT
  Filled 2022-01-30 (×4): qty 3

## 2022-01-30 MED ORDER — SODIUM CHLORIDE 0.9 % IV SOLN
500.0000 mg | INTRAVENOUS | Status: DC
  Administered 2022-01-30 – 2022-02-03 (×5): 500 mg via INTRAVENOUS
  Filled 2022-01-30 (×5): qty 5

## 2022-01-30 NOTE — ED Triage Notes (Signed)
Pt. BIB [facility] from home. Pt. C/o SOB for the past 8 months that has increased within the last 6 days.

## 2022-01-30 NOTE — H&P (Addendum)
History and Physical    Patient: Raven Torres DOB: 1956-01-17 DOA: 01/30/2022 DOS: the patient was seen and examined on 01/30/2022 PCP: Ladell Pier, MD  Patient coming from: Home  Chief Complaint:  Chief Complaint  Patient presents with   Respiratory Distress   HPI: Raven Torres is a 66 y.o. female with medical history of supraglottic cancer status postradiation, smoker, Torres, hypertension, high cholesterol here for worsening breathing over the past 6 days.  Reports she always has trouble breathing, does not wear oxygen at home, but worse over the past 6 days.  She reports she is coughing up more sputum, reports no wheezing.  She reports she has felt hot but no fever measured at home.  She reports she was also felt very weak.  Has had issues with aspiration in the past due to her throat cancer radiation, does aspirate a lot.  Denies any abdominal pain, nausea, vomiting, chest pain, lower extremity edema, syncope or presyncope.  Review of Systems: As mentioned in the history of present illness. All other systems reviewed and are negative. Past Medical History:  Diagnosis Date   Arthritis    Cataract    removed both eyes    Colitis    Torres (chronic obstructive pulmonary disease) ([place])    Hepatitis C    Hyperlipidemia    Hypertension    Shingles 03/27/2020   TB (tuberculosis)    treatment    Past Surgical History:  Procedure Laterality Date   ABDOMINAL HYSTERECTOMY     CATARACT EXTRACTION, BILATERAL     COLONOSCOPY     COLONOSCOPY  04/04/2020   DIRECT LARYNGOSCOPY N/A 06/26/2020   Procedure: DIRECT LARYNGOSCOPY AND BIOPSY;  Surgeon: Rozetta Nunnery, MD;  Location: [place];  Service: ENT;  Laterality: N/A;   IR GASTROSTOMY TUBE MOD SED  07/27/2020   IR GASTROSTOMY TUBE REMOVAL  04/17/2021   IR IMAGING GUIDED PORT INSERTION  07/27/2020   IR REMOVAL TUN ACCESS W/ PORT W/O FL MOD SED  03/20/2021   KNEE SURGERY Right    POLYPECTOMY  11/17/2008    HPP x 1    Social History:  reports that she has been smoking cigarettes. She started smoking about 52 years ago. She has a 51.00 pack-year smoking history. She has never used smokeless tobacco. She reports that she does not drink alcohol and does not use drugs.  Allergies  Allergen Reactions   Tudorza Pressair [Aclidinium Bromide] Itching   Amitriptyline Other (See Comments)    Eye spasms per pt   Codeine Nausea Only   Tramadol Nausea Only    Family History  Problem Relation Age of Onset   Heart disease Mother    Cancer Mother    Melanoma Mother    Esophageal cancer Father    Colon cancer Neg Hx    Colon polyps Neg Hx    Rectal cancer Neg Hx    Stomach cancer Neg Hx     Prior to Admission medications   Medication Sig Start Date End Date Taking? Authorizing Provider  albuterol (PROVENTIL) (2.5 MG/3ML) 0.083% nebulizer solution Take 3 mLs (2.5 mg total) by nebulization every 6 (six) hours as needed for wheezing or shortness of breath. 05/11/20  Yes Ladell Pier, MD  albuterol (VENTOLIN HFA) 108 (90 Base) MCG/ACT inhaler INHALE 2 PUFFS INTO THE LUNGS EVERY 6 HOURS AS NEEDED FOR WHEEZING OR SHORTNESS OF BREATH Patient taking differently: Inhale 2 puffs into the lungs every 6 (six) hours as  needed for wheezing or shortness of breath. 04/18/21  Yes Ladell Pier, MD  Glycopyrrolate-Formoterol (BEVESPI AEROSPHERE) 9-4.8 MCG/ACT AERO Inhale 2 puffs into the lungs 2 (two) times daily. 01/09/22  Yes Ladell Pier, MD  lactose free nutrition (BOOST) LIQD Take 237 mLs by mouth 3 (three) times daily between meals.   Yes [provider]  lidocaine (XYLOCAINE) 2 % solution Patient: Mix 1part 2% viscous lidocaine, 1part H20. Swallow 67m of diluted mixture, 354m before meals and at bedtime, up to QID Patient taking differently: Use as directed 15 mLs in the mouth or throat in the morning, at noon, in the evening, and at bedtime. Patient: Mix 1part 2% viscous lidocaine, 1part  H20. Swallow 1022mf diluted mixture, 78m78mefore meals and at bedtime, up to QID 07/24/20  Yes SquiEppie Gibson  varenicline (CHANTIX STARTING MONTH PAK) 0.5 MG X 11 & 1 MG X 42 tablet Take one 0.5 mg tablet by mouth once daily for 3 days, then increase to one 0.5 mg tablet twice daily for 4 days, then increase to one 1 mg tablet twice daily. Patient not taking: Reported on 01/30/2022 04/13/21   JohnLadell Pier    Physical Exam: Vitals:   01/30/22 0933 01/30/22 1000 01/30/22 1007 01/30/22 1045  BP:  125/64  (!) 149/72  Pulse:  (!) 101  (!) 101  Resp:  (!) 21  (!) 29  Temp:   98.2 F (36.8 C)   TempSrc:   Rectal   SpO2: 94% 96%  100%   Physical Exam Vitals and nursing note reviewed.  Constitutional:      Appearance: Normal appearance. She is cachectic. She is ill-appearing.  HENT:     Head: Normocephalic and atraumatic.     Mouth/Throat:     Mouth: Mucous membranes are dry.  Cardiovascular:     Rate and Rhythm: Normal rate and regular rhythm.  Pulmonary:     Effort: Respiratory distress present.     Breath sounds: Wheezing and rhonchi present.  Abdominal:     General: Abdomen is flat. There is no distension.     Palpations: Abdomen is soft. There is no mass.     Tenderness: There is no abdominal tenderness.  Musculoskeletal:     Right lower leg: No edema.     Left lower leg: No edema.  Skin:    General: Skin is warm and dry.     Capillary Refill: Capillary refill takes less than 2 seconds.  Neurological:     Mental Status: She is alert and oriented to person, place, and time.  Psychiatric:        Mood and Affect: Mood normal.        Behavior: Behavior normal.     Data Reviewed:     Latest Ref Rng & Units 01/30/2022    9:37 AM 07/19/2021   12:50 PM 02/08/2021   12:01 PM  CBC  WBC 4.0 - 10.5 K/uL 10.8  5.8  5.3   Hemoglobin 12.0 - 15.0 g/dL 14.1  13.5  12.4   Hematocrit 36.0 - 46.0 % 45.7  42.4  37.6   Platelets 150 - 400 K/uL 176  123  93       Latest  Ref Rng & Units 01/30/2022    9:37 AM 07/19/2021   12:50 PM 02/08/2021   12:01 PM  BMP  Glucose 70 - 99 mg/dL 112  86  99   BUN 8 - 23 mg/dL 18  15  16   Creatinine 0.44 - 1.00 mg/dL 0.46  0.79  0.78   Sodium 135 - 145 mmol/L 147  141  140   Potassium 3.5 - 5.1 mmol/L 3.5  4.0  4.7   Chloride 98 - 111 mmol/L 115  103  102   CO2 22 - 32 mmol/L '26  29  30   '$ Calcium 8.9 - 10.3 mg/dL 6.7  9.4  9.4    Chest x-ray: Left lower and middle lobe hazy infiltrates superimposed over nodular infiltrates.  Initial ABG with a PCO2 of 80, pH 7.37, bicarb 46.2  Assessment and Plan:  Raven Torres is a 66 y.o. female with medical history of supraglottic cancer status postradiation, smoker, Torres, hypertension, high cholesterol here for worsening breathing over the past 6 days found to have an community acquired pneumonia.  Raven Torres, acute Torres Current Smoker Patient reports worsening breathing over the past 6 days compared to her baseline difficulty breathing.  Does not wear oxygen at home.  Chest x-ray shows a patchy left-sided infiltrate, elevated white blood cell count, her symptoms points to pneumonia.  She does not have any other localizing signs of infection.  We will treat her for community-acquired pneumonia.  She also presents with bilateral wheezing consistent with Raven Torres.  She has a long smoking history and stopped a week or 2 ago though daughter reports may have been smoking this week as well.  She had prior PFTs in 2019 with FEV1 over FVC ratio of 4. -Strep pneumo antigen, Legionella antigen, RVP, blood cultures, Gram stain and culture of sputum, MRSA nares -Treat Raven Torres with IV Solu-Medrol (due to difficulty with p.o. intake) 60 mg every 6 hours, scheduled DuoNebs every 6 hours -continue home bevespi with formula alternative - Treat community-acquired pneumonia with ceftriaxone, and azithromycin  Cachexia Subglottic malignancy status post  radiation Patient and daughter report she has had very poor p.o. intake over the past several months leading to significant weight loss.  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 She has previously denied placement of G/J-tube for feeding.  Daughter and patient are open to a palliative care consult when she is more stable. -nutritional consult, ordered home boost -SLP consult -Consider hospice/palliative care consult once patient has improved  Hypernatremia Due to poor p.o. intake status post 500 cc LR in ED. -We will repeat BMP and give LR tomorrow if still elevated  Hep C with cirrhosis status post cure HTN-hold home medications HLD  DVT ppx: lovenox Diet: Thin clear liquids Advance Care Planning:   Code Status: DNR  Consults: SLP, nutrition Family Communication: d/w daughter  Severity of Illness: The appropriate patient status for this patient is INPATIENT. Inpatient status is judged to be reasonable and necessary in order to provide the required intensity of service to ensure the patient's safety. The patient's presenting symptoms, physical exam findings, and initial radiographic and laboratory data in the context of their chronic comorbidities is felt to place them at high risk for further clinical deterioration. Furthermore, it is not anticipated that the patient will be medically stable for discharge from the hospital within 2 midnights of admission.   * I certify that at the point of admission it is my clinical judgment that the patient will require inpatient hospital care spanning beyond 2 midnights from the point of admission due to high intensity of service, high risk for further deterioration and high frequency of surveillance required.*  Author: Lorelei Pont,  MD 01/30/2022 12:47 PM  For on call review [email or web address].

## 2022-01-30 NOTE — Progress Notes (Signed)
01/30/22 1200  SLP Visit Information  SLP Received On 01/30/22  Subjective  Subjective pt awake in bed, on high level of oxygen and not using NRB stating it makes her dizzy  Patient/Family Stated Goal to get water  General Information  Date of Onset 11/07/21  HPI 66 yo female adm to [facility] with respiratory difficulties, CXR concerning for pna.  Pt has complex medical hx including COPD COVID 19 09/2020, supralaryngeal cancer x/p chemo/XRT, continues to smoke, moderate to severe esophageal dysmotility diagnosed 11/2021 per esophagram, mild pharyngeal based dysphagia diagnosed as an OP 12/14/2021.  She has had a PEG in the past that was removed.  CXR concerning for left lung infectious pna and pt requiring NRB for respiratory support. Pt admits to weight loss ongoing but reports her swallowing is "better".  Swallow eval ordered.  Type of Study Bedside Swallow Evaluation  Previous Swallow Assessment mbs 12/2021, esophagram 11/2021  Diet Prior to this Study Dysphagia 3 (soft);Thin liquids  Temperature Spikes Noted N/A  Respiratory Status Nasal cannula;Other (comment) (HFNC)  Behavior/Cognition Alert  Oral Cavity Assessment Dry  Oral Care Completed by SLP No  Oral Cavity - Dentition Edentulous  Self-Feeding Abilities Total assist  Patient Positioning Upright in bed  Baseline Vocal Quality Hoarse;Low vocal intensity  Volitional Cough Weak;Congested  Volitional Swallow Able to elicit  Oral Assessment (Complete on admission/transfer/every shift)  Oral Assessment  (WDL) X  Lips Pink;Dry  Tongue Dry  Mucous Membrane(s) Pink  Saliva Insufficient  Level of Consciousness Alert  Nutritional status Dependent feeder  Oral Motor/Sensory Function  Overall Oral Motor/Sensory Function Generalized oral weakness  Thin Liquid  Thin Liquid Impaired  Presentation Spoon;Straw  Pharyngeal  Phase Impairments Suspected delayed Swallow;Multiple swallows;Cough - Immediate  Other Comments chin tuck not performed  adequately  Nectar Thick Liquid  Nectar Thick Liquid Impaired  Presentation Spoon  Pharyngeal Phase Impairments Cough - Immediate  Honey Thick Liquid  Honey Thick Liquid NT  Puree  Puree Impaired  Pharyngeal Phase Impairments Cough - Delayed  Solid  Solid NT  SLP - End of Session  Patient left in bed;with call bell/phone within reach  SLP Assessment  Clinical Impression Statement (ACUTE ONLY) Patient known to SLP from prior OP MBS *when she was well* Dec 14, 2021.  Her baseline dysphagia is due to her supraglottic lcancer tx effects including dysmotility and aspiration.  Currently pt is needing high levels of oxygen.  Baseline cough noted but cough also noted post-swallow of all boluses.  Her RR increases and her voice is dysphonic. Given respiratory demands and suspected exacerbation of baseline dysphagia as well as gross weakness not allowing productive cough, rec clears via tsp only.  Prior MBS exam revealed retention due to dysmotility - vallecular more than pyriform sinus.  She aspirated with thin and reflexive cough did not clear airway.  At this time, do not recommend repeat MBS -and advise only tsps of clears with chin tuck posture otherwise npo.  Would recommend consider palliative referral given pt's reported ongoing weight loss and new respiratory deficits.  Dependent on GOC, repeat MBS may be beneficial to assess given her sensorimotor deficits from iatrogenic effects of XRT for supraglottic cancer.  RN informed and sign created.  SLP Visit Diagnosis Dysphagia, oropharyngeal phase (R13.12)  Impact on safety and function Risk for inadequate nutrition/hydration;Severe aspiration risk  Other Related Risk Factors History of pneumonia;History of esophageal-related issues;Decreased management of secretions;Lethargy;Deconditioning;Decreased respiratory status  Swallow Evaluation Recommendations  Recommended Consults Other (Comment) (palliative consult)  SLP Diet Recommendations Thin liquid   Liquid Administration via Spoon  Medication Administration Via alternative means  Supervision Full supervision/cueing for compensatory strategies  Compensations Slow rate;Small sips/bites;Chin tuck  Postural Changes Seated upright at 90 degrees;Remain upright for at least 30 minutes after po intake  Treatment Plan  Oral Care Recommendations Oral care QID  Treatment Recommendations Therapy as outlined in treatment plan below  Assistance recommended at discharge Frequent or constant Supervision/Assistance  Functional Status Assessment Patient has had a recent decline in their functional status and/or demonstrates limited ability to make significant improvements in function in a reasonable and predictable amount of time  Speech Therapy Frequency (ACUTE ONLY) min 2x/week  Treatment Duration 1 week  Interventions Aspiration precaution training;Compensatory techniques;Patient/family education;Trials of upgraded texture/liquids  Prognosis  Prognosis for Safe Diet Advancement Guarded  Barriers to Reach Goals Time post onset;Severity of deficits  Individuals Consulted  Consulted and Agree with Results and Recommendations Patient;RN  Report Sent to  Referring physician  SLP Time Calculation  SLP Start Time (ACUTE ONLY) 1200  SLP Stop Time (ACUTE ONLY) 1225  SLP Time Calculation (min) (ACUTE ONLY) 25 min  SLP Evaluations  $ SLP Speech Visit 1 Visit  SLP Evaluations  $BSS Swallow 1 Procedure  [name], MS [facility] SLP Acute Rehab Services Office [phone] Pager [phone]

## 2022-01-30 NOTE — ED Notes (Signed)
Pt. O2 % decreased to 79%, nonrebreathing applied. Pt. Also continues to remove her [place], Pt. Reeducated on importance of [place]. Sats stabilized to 95%.

## 2022-01-30 NOTE — ED Provider Notes (Signed)
[facility] DEPT Provider Note   CSN: [id number] Arrival date & time: 01/30/22  0915     History  Chief Complaint  Patient presents with   Respiratory Distress    Raven Torres is a 66 y.o. female.  HPI 66 year old female history of COPD and lung cancer presents today with respiratory distress.  History is obtained from patient and from EMS.  EMS reports that daughter called out due to increased dyspnea.  They report that she has been increasingly distant over 6 months and worse over the past 6 days.  She has had some increasing cough.  When they arrived they found her sats to be in the 18s.  She was placed on nonrebreather.  Heart rate was approximately 100 and blood pressure was around 130.  In route she received albuterol 10 mg Solu-Medrol 125.    Home Medications Prior to Admission medications   Medication Sig Start Date End Date Taking? Authorizing Provider  albuterol (PROVENTIL) (2.5 MG/3ML) 0.083% nebulizer solution Take 3 mLs (2.5 mg total) by nebulization every 6 (six) hours as needed for wheezing or shortness of breath. 05/11/20   Ladell Pier, MD  albuterol (VENTOLIN HFA) 108 (90 Base) MCG/ACT inhaler INHALE 2 PUFFS INTO THE LUNGS EVERY 6 HOURS AS NEEDED FOR WHEEZING OR SHORTNESS OF BREATH 04/18/21   Ladell Pier, MD  Glycopyrrolate-Formoterol (BEVESPI AEROSPHERE) 9-4.8 MCG/ACT AERO Inhale 2 puffs into the lungs 2 (two) times daily. 01/09/22   Ladell Pier, MD  lactose free nutrition (BOOST) LIQD Take 237 mLs by mouth 3 (three) times daily between meals.    [provider]  lidocaine (XYLOCAINE) 2 % solution Patient: Mix 1part 2% viscous lidocaine, 1part H20. Swallow 7m of diluted mixture, 363m before meals and at bedtime, up to QID Patient taking differently: Use as directed 15 mLs in the mouth or throat in the morning, at noon, in the evening, and at bedtime. Patient: Mix 1part 2% viscous lidocaine, 1part H20.  Swallow 1031mf diluted mixture, 43m27mefore meals and at bedtime, up to QID 07/24/20   SquiEppie Gibson  varenicline (CHANTIX STARTING MONTH PAK) 0.5 MG X 11 & 1 MG X 42 tablet Take one 0.5 mg tablet by mouth once daily for 3 days, then increase to one 0.5 mg tablet twice daily for 4 days, then increase to one 1 mg tablet twice daily. 04/13/21   JohnLadell Pier      Allergies    TudoCaprice Renshawssair [aclidinium bromide], Amitriptyline, Codeine, and Tramadol    Review of Systems   Review of Systems  Physical Exam Updated Vital Signs BP (!) 149/72   Pulse (!) 101   Temp 98.2 F (36.8 C) (Rectal)   Resp (!) 29   SpO2 100%  Physical Exam Vitals reviewed.  Constitutional:      General: She is in acute distress.     Appearance: She is ill-appearing.     Comments: Cachectic female in some respiratory distress  HENT:     Head: Normocephalic.     Right Ear: External ear normal.     Left Ear: External ear normal.     Nose: Nose normal.     Mouth/Throat:     Pharynx: Oropharynx is clear.  Eyes:     Pupils: Pupils are equal, round, and reactive to light.  Cardiovascular:     Rate and Rhythm: Regular rhythm. Tachycardia present.     Pulses: Normal pulses.  Heart sounds: Normal heart sounds.  Pulmonary:     Effort: Respiratory distress present.     Breath sounds: Wheezing present.  Abdominal:     Palpations: Abdomen is soft.  Musculoskeletal:        General: No swelling. Normal range of motion.     Cervical back: Normal range of motion.     Right lower leg: No edema.     Left lower leg: No edema.  Skin:    General: Skin is warm and dry.     Capillary Refill: Capillary refill takes less than 2 seconds.  Neurological:     General: No focal deficit present.     Mental Status: She is alert.  Psychiatric:        Mood and Affect: Mood normal.     ED Results / Procedures / Treatments   Labs (all labs ordered are listed, but only abnormal results are displayed) Labs  Reviewed  BLOOD GAS, ARTERIAL - Abnormal; Notable for the following components:      Result Value   pCO2 arterial 80 (*)    Bicarbonate 46.2 (*)    Acid-Base Excess 16.7 (*)    All other components within normal limits  CBC - Abnormal; Notable for the following components:   WBC 10.8 (*)    All other components within normal limits  COMPREHENSIVE METABOLIC PANEL - Abnormal; Notable for the following components:   Sodium 147 (*)    Chloride 115 (*)    Glucose, Bld 112 (*)    Calcium 6.7 (*)    Total Protein 5.2 (*)    Albumin 2.2 (*)    All other components within normal limits    EKG EKG Interpretation  Date/Time:  Wednesday January 30 2022 09:45:49 EDT Ventricular Rate:  94 PR Interval:  117 QRS Duration: 88 QT Interval:  377 QTC Calculation: 472 R Axis:   84 Text Interpretation: Sinus rhythm Borderline short PR interval Biatrial enlargement RSR' in V1 or V2, probably normal variant Borderline T wave abnormalities No significant change since last tracing Confirmed by Pattricia Boss [phone]) on 01/30/2022 10:19:59 AM  Radiology DG Chest Port 1 View  Result Date: 01/30/2022 CLINICAL DATA:  A 66 year old female presents with cough and dyspnea. EXAM: PORTABLE CHEST 1 VIEW COMPARISON:  February 2022. FINDINGS: EKG leads project over the chest. Signs of loss of subcutaneous fat since previous imaging, cachectic appearing on the current study. Lungs are hyperinflated. Cardiomediastinal contours and hilar structures are stable. Patchy interstitial and airspace disease with subtle nodular component in the LEFT mid and lower chest. No visible pneumothorax. No gross signs of pleural effusion. On limited assessment there is no acute skeletal process. IMPRESSION: 1. Patchy interstitial and airspace disease with subtle nodular component in the LEFT mid and lower chest, findings are suspicious for pneumonia superimposed on pulmonary emphysema. Given nodular component would suggest follow-up PA and  lateral chest after resolution of symptoms or following therapy as appropriate. 2. Loss of subcutaneous fat compared to previous imaging is dramatic. 3. COPD. Electronically Signed   By: Zetta Bills M.D.   On: 01/30/2022 10:09    Procedures .Critical Care  Performed by: Pattricia Boss, MD Authorized by: Pattricia Boss, MD   Critical care provider statement:    Critical care time (minutes):  30   Critical care was necessary to treat or prevent imminent or life-threatening deterioration of the following conditions:  Respiratory failure   Critical care was time spent personally by me on the following  activities:  Development of treatment plan with patient or surrogate, discussions with consultants, evaluation of patient's response to treatment, examination of patient, ordering and review of laboratory studies, ordering and review of radiographic studies, ordering and performing treatments and interventions, pulse oximetry, re-evaluation of patient's condition and review of old charts     Medications Ordered in ED Medications  cefTRIAXone (ROCEPHIN) 1 g in sodium chloride 0.9 % 100 mL IVPB (1 g Intravenous New Bag/Given 01/30/22 1027)  azithromycin (ZITHROMAX) 500 mg in sodium chloride 0.9 % 250 mL IVPB (has no administration in time range)  lactated ringers bolus 500 mL (has no administration in time range)  albuterol (PROVENTIL) (2.5 MG/3ML) 0.083% nebulizer solution 5 mg (5 mg Nebulization Given 01/30/22 1004)    ED Course/ Medical Decision Making/ A&P Clinical Course as of 01/30/22 1053  Wed Jan 30, 2022  1017 This x-Maryssa Giampietro reviewed and interpreted and significant for left sided pneumonia Rocephin and Zithromax ordered  [DR]  1017 CBC(!) [DR]  1017 Comprehensive metabolic panel(!) CBC reviewed interpreted with mild leukocytosis [DR]  1017 Comprehensive metabolic panel(!) Complete metabolic panel reviewed interpreted and significant for hyponatremia at 147, hyperchloremia 115, normal renal  function, [DR]  1025 ABG reviewed interpreted and PCO2 is elevated at 80 with normal pH [DR]  60 No old ABG to compare [DR]    Clinical Course User Index [DR] Pattricia Boss, MD                           Medical Decision Making 67 year old female history of head neck cancer, COPD, tobacco abuse, coronary artery disease, presents today complaining of increased dyspnea.  She reports that she has ongoing chronic cough but has not really noted any change in this.  She has had increased weakness and cough over the past 6 days.  She denies any known fever had has had some chills. Differential diagnosis includes diseases of the lungs includes but is not limited to COPD exacerbation, pneumothorax, infectious lung etiologies, coronary artery disease, cardiac etiology such as arrhythmia, metabolic abnormalities On evaluation here patient with left sided infiltrate consistent with pneumonia Rocephin and Zithromax ordered Patient has some diffuse wheezing on her exam and prehospital received Solu-Medrol and albuterol as well as Altafed.  Here in the ED she is receiving albuterol She has a new oxygen demand  #1 respiratory distress likely secondary to combination of pneumonia and COPD exacerbation-Solu-Medrol, steroids, and bronchodilators given 2 elevated sodium at 147  Discussed with Dr. Posey Pronto who will see for admission   Amount and/or Complexity of Data Reviewed Labs: ordered. Decision-making details documented in ED Course. Radiology: ordered and independent interpretation performed. Decision-making details documented in ED Course.           Final Clinical Impression(s) / ED Diagnoses Final diagnoses:  Community acquired pneumonia of left lung, unspecified part of lung  Acute on chronic respiratory failure with hypoxia [facility])    Rx / DC Orders ED Discharge Orders     None         Pattricia Boss, MD 01/30/22 1053

## 2022-01-31 ENCOUNTER — Inpatient Hospital Stay (HOSPITAL_COMMUNITY): Payer: Medicare Other

## 2022-01-31 ENCOUNTER — Inpatient Hospital Stay: Payer: Medicare Other | Attending: Hematology and Oncology | Admitting: Hematology and Oncology

## 2022-01-31 DIAGNOSIS — L8990 Pressure ulcer of unspecified site, unspecified stage: Secondary | ICD-10-CM | POA: Diagnosis present

## 2022-01-31 DIAGNOSIS — J189 Pneumonia, unspecified organism: Secondary | ICD-10-CM

## 2022-01-31 DIAGNOSIS — L89152 Pressure ulcer of sacral region, stage 2: Secondary | ICD-10-CM | POA: Diagnosis not present

## 2022-01-31 LAB — CBC
HCT: 45.3 % (ref 36.0–46.0)
Hemoglobin: 14.2 g/dL (ref 12.0–15.0)
MCH: 30.4 pg (ref 26.0–34.0)
MCHC: 31.3 g/dL (ref 30.0–36.0)
MCV: 97 fL (ref 80.0–100.0)
Platelets: 174 10*3/uL (ref 150–400)
RBC: 4.67 MIL/uL (ref 3.87–5.11)
RDW: 12.7 % (ref 11.5–15.5)
WBC: 12.7 10*3/uL — ABNORMAL HIGH (ref 4.0–10.5)
nRBC: 0 % (ref 0.0–0.2)

## 2022-01-31 LAB — COMPREHENSIVE METABOLIC PANEL
ALT: 24 U/L (ref 0–44)
AST: 29 U/L (ref 15–41)
Albumin: 3.2 g/dL — ABNORMAL LOW (ref 3.5–5.0)
Alkaline Phosphatase: 58 U/L (ref 38–126)
Anion gap: 9 (ref 5–15)
BUN: 25 mg/dL — ABNORMAL HIGH (ref 8–23)
CO2: 34 mmol/L — ABNORMAL HIGH (ref 22–32)
Calcium: 9.6 mg/dL (ref 8.9–10.3)
Chloride: 103 mmol/L (ref 98–111)
Creatinine, Ser: 0.66 mg/dL (ref 0.44–1.00)
GFR, Estimated: >60 mL/min (ref >60)
Glucose, Bld: 143 mg/dL — ABNORMAL HIGH (ref 70–99)
Potassium: 4 mmol/L (ref 3.5–5.1)
Sodium: 146 mmol/L — ABNORMAL HIGH (ref 135–145)
Total Bilirubin: 0.7 mg/dL (ref 0.3–1.2)
Total Protein: 7.7 g/dL (ref 6.5–8.1)

## 2022-01-31 LAB — PROCALCITONIN: Procalcitonin: 0.13 ng/mL

## 2022-01-31 MED ORDER — STERILE WATER FOR INJECTION IJ SOLN
INTRAMUSCULAR | Status: AC
  Administered 2022-01-31: 10 mL
  Filled 2022-01-31: qty 10

## 2022-01-31 MED ORDER — ENSURE ENLIVE PO LIQD
237.0000 mL | Freq: Every day | ORAL | Status: DC
  Administered 2022-01-31 – 2022-02-07 (×30): 237 mL via ORAL

## 2022-01-31 MED ORDER — BOOST PLUS PO LIQD
237.0000 mL | Freq: Three times a day (TID) | ORAL | Status: DC
  Administered 2022-01-31: 237 mL via ORAL
  Filled 2022-01-31 (×3): qty 237

## 2022-01-31 MED ORDER — ALBUTEROL SULFATE (2.5 MG/3ML) 0.083% IN NEBU
2.5000 mg | INHALATION_SOLUTION | Freq: Two times a day (BID) | RESPIRATORY_TRACT | Status: DC
  Administered 2022-02-01 – 2022-02-02 (×3): 2.5 mg via RESPIRATORY_TRACT
  Filled 2022-01-31 (×3): qty 3

## 2022-01-31 MED ORDER — IOHEXOL 350 MG/ML SOLN
75.0000 mL | Freq: Once | INTRAVENOUS | Status: AC | PRN
  Administered 2022-01-31: 75 mL via INTRAVENOUS

## 2022-01-31 MED ORDER — ASPIRIN 81 MG PO TBEC
81.0000 mg | DELAYED_RELEASE_TABLET | Freq: Every day | ORAL | Status: DC
  Administered 2022-01-31 – 2022-02-01 (×2): 81 mg via ORAL
  Filled 2022-01-31 (×2): qty 1

## 2022-01-31 NOTE — Progress Notes (Signed)
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 Neuro exam remains same with left UE weakness . As per patient reporting , symptoms will be 27 hours at this time .  Plan  - Will check CTA head and neck , 2 D echo to look for any source of embolism . Cancer patient high risk for thromboembolism.  - will discuss with neurology after reports available for consult  - transferring to [facility] for stroke care is not going to change any outcome overnight , will stay at the current floor .  - neuro checks  - tele  - aspirin  - speech / PT/ OT already on board  - palliative consulted due to severe medical issues and failure ti thrive

## 2022-01-31 NOTE — Evaluation (Signed)
Physical Therapy Evaluation Patient Details Name: Raven Torres MRN: [id number] DOB: 1956-04-09 Today's Date: 01/31/2022  History of Present Illness  66 y.o. female with medical history of supraglottic cancer status postradiation, smoker, COPD, hypertension, high cholesterol here for worsening breathing over the past 6 days. Dx of PNA.  Clinical Impression  Pt admitted with above diagnosis. Pt ambulated 180' with min hand held assist of 1 for balance, SpO2 85% on room air walking, 88% on room air at rest.  Pt currently with functional limitations due to the deficits listed below (see PT Problem List). Pt will benefit from skilled PT to increase their independence and safety with mobility to allow discharge to the venue listed below.          Recommendations for follow up therapy are one component of a multi-disciplinary discharge planning process, led by the attending physician.  Recommendations may be updated based on patient status, additional functional criteria and insurance authorization.  Follow Up Recommendations Home health PT if family can provide 74* assist      Assistance Recommended at Discharge Intermittent Supervision/Assistance  Patient can return home with the following  A little help with walking and/or transfers;A little help with bathing/dressing/bathroom;Assistance with cooking/housework;Assist for transportation    Equipment Recommendations Rollator (4 wheels)  Recommendations for Other Services       Functional Status Assessment Patient has had a recent decline in their functional status and demonstrates the ability to make significant improvements in function in a reasonable and predictable amount of time.     Precautions / Restrictions Precautions Precautions: Other (comment) Precaution Comments: no falls in past 6 months; monitor O2 Restrictions Weight Bearing Restrictions: No      Mobility  Bed Mobility Overal bed mobility: Needs Assistance Bed  Mobility: Supine to Sit     Supine to sit: Min assist     General bed mobility comments: assist to raise trunk    Transfers Overall transfer level: Needs assistance Equipment used: 1 person hand held assist Transfers: Sit to/from Stand Sit to Stand: Min assist           General transfer comment: min A to power up/steady    Ambulation/Gait Ambulation/Gait assistance: Min assist Gait Distance (Feet): 180 Feet Assistive device: 1 person hand held assist Gait Pattern/deviations: Step-through pattern, Decreased stride length, Narrow base of support Gait velocity: decr     General Gait Details: min A for balance, SaO2 85% on room air walking, 88% on RA at rest  Stairs            Wheelchair Mobility    Modified Rankin (Stroke Patients Only)       Balance Overall balance assessment: Needs assistance   Sitting balance-Leahy Scale: Good     Standing balance support: Single extremity supported Standing balance-Leahy Scale: Poor Standing balance comment: relies on single UE support in standing                             Pertinent Vitals/Pain      Home Living Family/patient expects to be discharged to:: Private residence Living Arrangements: Children Available Help at Discharge: Family;Available PRN/intermittently   Home Access: Stairs to enter   Entrance Stairs-Number of Steps: 4   Home Layout: One level Home Equipment: Grab bars - tub/shower Additional Comments: daughter plans to get a shower seat    Prior Function Prior Level of Function : Independent/Modified Independent;Driving  Mobility Comments: walks without AD ADLs Comments: independent     Hand Dominance        Extremity/Trunk Assessment   Upper Extremity Assessment Upper Extremity Assessment: Defer to OT evaluation;LUE deficits/detail LUE Deficits / Details: L hand flaccid    Lower Extremity Assessment Lower Extremity Assessment: LLE  deficits/detail;RLE deficits/detail RLE Deficits / Details: knee ext +4/5 RLE Sensation: history of peripheral neuropathy LLE Deficits / Details: knee ext +4/5 LLE Sensation: history of peripheral neuropathy    Cervical / Trunk Assessment Cervical / Trunk Assessment: Kyphotic  Communication   Communication: HOH  Cognition Arousal/Alertness: Awake/alert Behavior During Therapy: WFL for tasks assessed/performed Overall Cognitive Status: Within Functional Limits for tasks assessed                                 General Comments: some mild memory issues        General Comments      Exercises     Assessment/Plan    PT Assessment Patient needs continued PT services  PT Problem List Decreased activity tolerance;Cardiopulmonary status limiting activity;Decreased mobility       PT Treatment Interventions Gait training;Therapeutic activities;Therapeutic exercise;Patient/family education;Functional mobility training    PT Goals (Current goals can be found in the Care Plan section)  Acute Rehab PT Goals Patient Stated Goal: gardening PT Goal Formulation: With patient Time For Goal Achievement: 02/14/22 Potential to Achieve Goals: Fair    Frequency Min 3X/week     Co-evaluation               AM-PAC PT "6 Clicks" Mobility  Outcome Measure Help needed turning from your back to your side while in a flat bed without using bedrails?: A Little Help needed moving from lying on your back to sitting on the side of a flat bed without using bedrails?: A Little Help needed moving to and from a bed to a chair (including a wheelchair)?: A Little Help needed standing up from a chair using your arms (e.g., wheelchair or bedside chair)?: A Lot Help needed to walk in hospital room?: A Little Help needed climbing 3-5 steps with a railing? : A Lot 6 Click Score: 16    End of Session Equipment Utilized During Treatment: Gait belt;Oxygen Activity Tolerance: Patient  limited by fatigue Patient left: in chair;with family/visitor present;with call bell/phone within reach Nurse Communication: Mobility status PT Visit Diagnosis: Difficulty in walking, not elsewhere classified (R26.2)    Time: 6387-5643 PT Time Calculation (min) (ACUTE ONLY): 33 min   Charges:   PT Evaluation $PT Eval Moderate Complexity: 1 Mod PT Treatments $Gait Training: 8-22 mins       Blondell Reveal Kistler PT 01/31/2022  Acute Rehabilitation Services  Office [phone]

## 2022-01-31 NOTE — Plan of Care (Signed)

## 2022-01-31 NOTE — Progress Notes (Signed)
Speech Language Pathology Treatment: Dysphagia  Patient Details Name: Raven Torres MRN: [id number] DOB: 01-09-1956 Today's Date: 01/31/2022 Time: 0254-2706 SLP Time Calculation (min) (ACUTE ONLY): 47 min  Assessment / Plan / Recommendation Clinical Impression  Pt seen today for skilled SLP to assess po tolerance, readiness for dietary advancement.  Pt willing to consume water, Ensure, and graham crackers.  Cough noted x3 during session - at baseline but also with intake.  Delayed cough noted with expectoration of viscous secretions - after swallowing Ensure.  SLP advised pt and her daughter to SLP role being to mitigate aspiration not prevent it. Reviewed swallow precautions using teach back , reviewed MBS, swallow exercises and provided written strategies.  Will follow for dysphagia management. PT needs cues to follow strategies, but states she knows her swallowing. Needs encouragement to keep oxygen on - placed water bottle and provided oral moisture for nares for comfort.    HPI HPI: 66 yo female adm to [facility] with respiratory difficulties, CXR concerning for pna.  Pt has complex medical hx including COPD COVID 19 09/2020, supralaryngeal cancer x/p chemo/XRT, continues to smoke, moderate to severe esophageal dysmotility diagnosed 11/2021 per esophagram, mild pharyngeal based dysphagia diagnosed as an OP 12/14/2021.  She has had a PEG in the past that was removed.  CXR concerning for left lung infectious pna and pt requiring NRB for respiratory support. Pt admits to weight loss ongoing but reports her swallowing is "better".  Swallow eval ordered.      SLP Plan  Continue with current plan of care      Recommendations for follow up therapy are one component of a multi-disciplinary discharge planning process, led by the attending physician.  Recommendations may be updated based on patient status, additional functional criteria and insurance authorization.    Recommendations  Diet  recommendations: Dysphagia 3 (mechanical soft);Thin liquid Liquids provided via: Straw Medication Administration: Crushed with puree Supervision: Patient able to self feed Compensations: Slow rate;Small sips/bites;Chin tuck;Follow solids with liquid Postural Changes and/or Swallow Maneuvers: Chin tuck                Oral Care Recommendations: Oral care QID Assistance recommended at discharge: Frequent or constant Supervision/Assistance SLP Visit Diagnosis: Dysphagia, oropharyngeal phase (R13.12) Plan: Continue with current plan of care         [name], MS [place] Office [phone] Pager [phone]   [name]  01/31/2022, 1:29 PM

## 2022-01-31 NOTE — Progress Notes (Signed)
Initial Nutrition Assessment  DOCUMENTATION CODES:   Severe malnutrition in context of chronic illness, Underweight  INTERVENTION:  - will d/c Boost Plus d/t chocolate only being available.  - will order Ensure Plus High Protein x5/day, each supplement provides 350 kcal and 20 grams of protein.  - if within [facility], recommend discussion about PEG placement to allow patient to consistently meet nutrition needs and to take the burden off of her to eat and drink sufficiently, especially during periods when she is sick.    NUTRITION DIAGNOSIS:   Severe Malnutrition related to chronic illness, cancer and cancer related treatments as evidenced by severe fat depletion, severe muscle depletion.  GOAL:   Patient will meet greater than or equal to 90% of their needs  MONITOR:   PO intake, Supplement acceptance, Labs, Weight trends  REASON FOR ASSESSMENT:   Malnutrition Screening Tool, Consult Assessment of nutrition requirement/status  ASSESSMENT:   66 year old female with medical history of supraglottic cancer s/p XRT, smoker, COPD, HTN, HLD, arthritis, and hepatitis C. she presented to the ED with 6 day history of worsened shortness of breath, increased sputum and coughing, and fever. She was feeling very weak. She had a PEG in the past but it has been removed. In the ED CXR showed advanced COPD changes and L middle lobe infiltrate. She was admitted for aspiration PNA and started on abx. She does not use oxygen at home.  Patient sitting up in bed with her daughter at bedside. Toward the end of RD visit, her daughter left and a close friend arrived. RD and SLP performing assessments on patient in tandem. Diet being advanced from CLD to Dysphagia 3, thin liquids.   Patient reports that she drinks 5 bottles of Ensure/day. They must be cold. She also drinks ice water and will eat things such as eggs prepared any way. She doesn't like beverages such as soda or sweet tea.   Patient shares that  her swallowing ability and voice quality decrease when she is sick. She shares that she does not take pill-form medications as she is afraid to swallow pills.   She shares that she does not sleep well. Shares that she is prescribed steroids outpatient; likely impacting ability to sleep. She often sleeps propped up on 10 pillows.   Weight yesterday was 85 lb and weight on 07/19/21 was 104 lb. This indicates 19 lb weight loss (18.3% body weight) in the past 6.5 months; significant for time frame.   Labs reviewed; Na: 146 mmol/l, BUN: 25 mg/dl. Medications reviewed; 60 mg solu-medrol QID.     NUTRITION - FOCUSED PHYSICAL EXAM:  Flowsheet Row Most Recent Value  Orbital Region Severe depletion  Upper Arm Region Severe depletion  Thoracic and Lumbar Region Unable to assess  Buccal Region Severe depletion  Temple Region Severe depletion  Clavicle Bone Region Severe depletion  Clavicle and Acromion Bone Region Severe depletion  Scapular Bone Region Severe depletion  Dorsal Hand Severe depletion  Patellar Region Severe depletion  Anterior Thigh Region Severe depletion  Posterior Calf Region Severe depletion  Edema (RD Assessment) None  Hair Reviewed  Eyes Reviewed  Mouth Reviewed  Skin Reviewed  Nails Reviewed       Diet Order:   Diet Order             DIET DYS 3 Room service appropriate? Yes; Fluid consistency: Thin  Diet effective now  EDUCATION NEEDS:   Education needs have been addressed  Skin:  Skin Assessment: Skin Integrity Issues: Skin Integrity Issues:: Stage I Stage I: bilateral coccyx  Last BM:  6/28 (Type 2 x1, medium amount)  Height:   Ht Readings from Last 1 Encounters:  01/30/22 '5\' 4"'$  (1.626 m)    Weight:   Wt Readings from Last 1 Encounters:  01/30/22 38.4 kg     BMI:  Body mass index is 14.53 kg/m.  Estimated Nutritional Needs:  Kcal:  1700-1900 kcal Protein:  80-95 grams Fluid:  >/= 1.7 L/day      Jarome Matin, MS, RD, LDN, CNSC Registered Dietitian II Inpatient Clinical Nutrition RD pager # and on-call/weekend pager # available in [facility]

## 2022-01-31 NOTE — Progress Notes (Signed)
Full progress note to follow.  Patient is doing better on respiratory standpoint.  On my examination interview, patient and daughter stated that she cannot use her left wrist since they inserted IV line yesterday in the ER. Patient has loss of extension of the left wrist.  No other neurological deficits.  Likely neuropraxia laying in the stretcher. This is started about 4 PM yesterday afternoon. I do not see any clinical documentation of left hand/wrist weakness. With her high risk factors, will check MRI of the brain to rule out any stroke.  Not an intervention candidate even if positive for stroke due to symptoms more than 12 hours.  Plan: MRI of the brain PT OT wrist splint and therapies.

## 2022-01-31 NOTE — Discharge Instructions (Signed)

## 2022-01-31 NOTE — Progress Notes (Signed)
PROGRESS NOTE    Raven Torres  JJO:[id number] DOB: [birth year] DOA: 01/30/2022 PCP: Ladell Pier, MD    Brief Narrative:  66 year old with history of supraglottic cancer status post radiation, smoker, COPD, hypertension, hyperlipidemia presented to the ER with about 6 days of worsening shortness of breath, coughing up more sputum and also subjective fever at home.  Felt very weak.  She does have chronic aspiration but currently on regular diet.  Used to have PEG tube but none now. In the emergency room, reportedly she was brought on nonrebreather.  WBC count normal.  Chest x-ray shows advanced COPD changes along with left middle lobe infiltrate.  Admitted with aspiration pneumonia and treated with antibiotics and oxygen.  Does not use oxygen at home.  6/29, and morning rounds as previous assessment patient has weakness of the left wrist started more than 12 hours ago.  Assessment & Plan:   Left middle lobe pneumonia, acute hypoxemic respiratory failure in a patient with underlying COPD, smoker: Started on Rocephin azithromycin, infective symptoms are fairly controlled. Chest physiotherapy, incentive spirometry, deep breathing exercises, sputum induction, mucolytic's and bronchodilators. Sputum cultures, blood cultures, Legionella and streptococcal antigen, pending. Supplemental oxygen to keep saturations more than 90%. Oxygen requirement improving. Significant history of COPD, started on IV steroids.  We will continue today. Speech therapy evaluation.  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 Poor clinical status, will need palliative care discussion. Advance diet as felt safe by speech therapy. Previously denied G-tube placement.  May benefit with palliation or hospice at home.  Hypernatremia: Sodium still elevated will will treat with half-normal saline.  Left wrist neuropraxia:  Possibly related to nerve injury/peripheral nerve injury on transportation in the hospital.  Less likely acute CNS process.  With her extensive risk factors, will check MRI of the brain to rule out any stroke. PT OT, wrist splint and exercises.  Pressure Injury 01/30/22 Coccyx Right;Left;Medial Stage 1 -  Intact skin with non-blanchable redness of a localized area usually over a bony prominence. Red, pink (Active)  01/30/22 1340  Location: Coccyx  Location Orientation: Right;Left;Medial  Staging: Stage 1 -  Intact skin with non-blanchable redness of a localized area usually over a bony prominence.  Wound Description (Comments): Red, pink  Present on Admission: Yes      DVT prophylaxis: enoxaparin (LOVENOX) injection 40 mg Start: 01/30/22 2200 Place TED hose Start: 01/30/22 1238   Code Status: DNR Family Communication: Daughter at bedside Disposition Plan: Status is: Inpatient Remains inpatient appropriate because: Hypoxia, IV antibiotics     Consultants:  Palliative  Procedures:  None  Antimicrobials:  Rocephin azithromycin 6/28----   Subjective: Patient was seen and examined.  Daughter at the bedside.  Oxygen requirement has improved.  Continues to have dry cough.  Afebrile.  She thinks she can swallow as he has been doing at home.  Complains of back of the neck pain. Patient told me that her left wrist does not move since they put an IV line on her left hand.  Objective: Vitals:   01/30/22 2006 01/30/22 2317 01/31/22 0356 01/31/22 0824  BP:  102/66 120/66   Pulse:  77 72   Resp:  20 (!) 21   Temp:  (!) 97.3 F (36.3 C) 97.6 F (36.4 C)   TempSrc:      SpO2: 97% 93% 98% 94%  Weight:      Height:        Intake/Output  Summary (Last 24 hours) at 01/31/2022 1314 Last data filed at 01/31/2022 1205 Gross per 24 hour  Intake 1351.9 ml  Output 300 ml  Net 1051.9 ml   Filed Weights   01/30/22 1326 01/30/22 1511  Weight: 38.4 kg 38.4 kg    Examination:  General  exam: Cachectic.  Frail debilitated.  Chronically sick looking. Respiratory system: Some conducted airway sounds.  Poor bilateral air entry at bases. SpO2: 94 % O2 Flow Rate (L/min): 4 L/min  Cardiovascular system: S1 & S2 heard, RRR. No pedal edema. Gastrointestinal system: Scaphoid abdomen.  Nontender.   Central nervous system: Alert and oriented.  Cranial nerves II through XII normal. Left upper extremity exam, wrist extension and flexion 0/5.  Elbow extension flexion 4/5.  Rest of the motor exam is normal. Pressure ulcer as above.   Data Reviewed: I have personally reviewed following labs and imaging studies  CBC: Recent Labs  Lab 01/30/22 0937 01/31/22 0457  WBC 10.8* 12.7*  HGB 14.1 14.2  HCT 45.7 45.3  MCV 97.9 97.0  PLT 176 326   Basic Metabolic Panel: Recent Labs  Lab 01/30/22 0937 01/31/22 0457  NA 147* 146*  K 3.5 4.0  CL 115* 103  CO2 26 34*  GLUCOSE 112* 143*  BUN 18 25*  CREATININE 0.46 0.66  CALCIUM 6.7* 9.6   GFR: Estimated Creatinine Clearance: 40.8 mL/min (by C-G formula based on SCr of 0.66 mg/dL). Liver Function Tests: Recent Labs  Lab 01/30/22 0937 01/31/22 0457  AST 28 29  ALT 12 24  ALKPHOS 39 58  BILITOT 1.0 0.7  PROT 5.2* 7.7  ALBUMIN 2.2* 3.2*   No results for input(s): "LIPASE", "AMYLASE" in the last 168 hours. No results for input(s): "AMMONIA" in the last 168 hours. Coagulation Profile: No results for input(s): "INR", "PROTIME" in the last 168 hours. Cardiac Enzymes: No results for input(s): "CKTOTAL", "CKMB", "CKMBINDEX", "TROPONINI" in the last 168 hours. BNP (last 3 results) No results for input(s): "PROBNP" in the last 8760 hours. HbA1C: No results for input(s): "HGBA1C" in the last 72 hours. CBG: No results for input(s): "GLUCAP" in the last 168 hours. Lipid Profile: No results for input(s): "CHOL", "HDL", "LDLCALC", "TRIG", "CHOLHDL", "LDLDIRECT" in the last 72 hours. Thyroid Function Tests: No results for input(s):  "TSH", "T4TOTAL", "FREET4", "T3FREE", "THYROIDAB" in the last 72 hours. Anemia Panel: No results for input(s): "VITAMINB12", "FOLATE", "FERRITIN", "TIBC", "IRON", "RETICCTPCT" in the last 72 hours. Sepsis Labs: Recent Labs  Lab 01/30/22 0937 01/31/22 0457  PROCALCITON <0.10 0.13    Recent Results (from the past 240 hour(s))  Culture, blood (Routine X 2) w Reflex to ID Panel     Status: None (Preliminary result)   Collection Time: 01/30/22 12:15 PM   Specimen: BLOOD  Result Value Ref Range Status   Specimen Description   Final    BLOOD BLOOD LEFT FOREARM Performed at [facility] [address], [place], [place] [address]    Special Requests   Final    BOTTLES DRAWN AEROBIC AND ANAEROBIC Blood Culture results may not be optimal due to an inadequate volume of blood received in culture bottles Performed at [facility] [address] [place], [place] [address]    Culture   Final    NO GROWTH < 24 HOURS Performed at [facility] [address], [place], [place] [address]    Report Status PENDING  Incomplete  Culture, blood (Routine X 2) w Reflex to ID  Panel     Status: None (Preliminary result)   Collection Time: 01/30/22 12:17 PM   Specimen: BLOOD  Result Value Ref Range Status   Specimen Description   Final    BLOOD LEFT ANTECUBITAL Performed at [facility] [address] [place], [place] [address]    Special Requests   Final    BOTTLES DRAWN AEROBIC ONLY Blood Culture adequate volume Performed at [facility] [address] [place], [place] [address]    Culture   Final    NO GROWTH < 24 HOURS Performed at [facility] [address], [place], [place] [address]    Report Status PENDING  Incomplete  Respiratory (~20 pathogens) panel by PCR     Status: Abnormal   Collection Time: 01/30/22 12:20 PM   Specimen: Nasopharyngeal Swab; Respiratory  Result Value Ref Range Status    Adenovirus NOT DETECTED NOT DETECTED Final   Coronavirus 229E NOT DETECTED NOT DETECTED Final    Comment: (NOTE) The Coronavirus on the Respiratory Panel, DOES NOT test for the novel  Coronavirus (2019 nCoV)    Coronavirus HKU1 NOT DETECTED NOT DETECTED Final   Coronavirus NL63 NOT DETECTED NOT DETECTED Final   Coronavirus OC43 NOT DETECTED NOT DETECTED Final   Metapneumovirus NOT DETECTED NOT DETECTED Final   Rhinovirus / Enterovirus NOT DETECTED NOT DETECTED Final   Influenza A NOT DETECTED NOT DETECTED Final   Influenza B NOT DETECTED NOT DETECTED Final   Parainfluenza Virus 1 NOT DETECTED NOT DETECTED Final   Parainfluenza Virus 2 NOT DETECTED NOT DETECTED Final   Parainfluenza Virus 3 NOT DETECTED NOT DETECTED Final   Parainfluenza Virus 4 DETECTED (A) NOT DETECTED Final   Respiratory Syncytial Virus NOT DETECTED NOT DETECTED Final   Bordetella pertussis NOT DETECTED NOT DETECTED Final   Bordetella Parapertussis NOT DETECTED NOT DETECTED Final   Chlamydophila pneumoniae NOT DETECTED NOT DETECTED Final   Mycoplasma pneumoniae NOT DETECTED NOT DETECTED Final    Comment: Performed at [facility], [place] [address], [place], [place] [address]  MRSA Next Gen by PCR, Nasal     Status: None   Collection Time: 01/30/22 12:20 PM   Specimen: Nasopharyngeal Swab; Nasal Swab  Result Value Ref Range Status   MRSA by PCR Next Gen NOT DETECTED NOT DETECTED Final    Comment: (NOTE) The GeneXpert MRSA Assay (FDA approved for NASAL specimens only), is one component of a comprehensive MRSA colonization surveillance program. It is not intended to diagnose MRSA infection nor to guide or monitor treatment for MRSA infections. Test performance is not FDA approved in patients less than 70 years old. Performed at [facility], [place] [address] [place], [place] [address]          Radiology Studies: [facility] Chest [place] 1 View  Result Date: 01/30/2022 CLINICAL DATA:  A  66 year old female presents with cough and dyspnea. EXAM: PORTABLE CHEST 1 VIEW COMPARISON:  February 2022. FINDINGS: EKG leads project over the chest. Signs of loss of subcutaneous fat since previous imaging, cachectic appearing on the current study. Lungs are hyperinflated. Cardiomediastinal contours and hilar structures are stable. Patchy interstitial and airspace disease with subtle nodular component in the LEFT mid and lower chest. No visible pneumothorax. No gross signs of pleural effusion. On limited assessment there is no acute skeletal process. IMPRESSION: 1. Patchy interstitial and airspace disease with subtle nodular component in the LEFT mid and lower chest, findings are suspicious for  pneumonia superimposed on pulmonary emphysema. Given nodular component would suggest follow-up PA and lateral chest after resolution of symptoms or following therapy as appropriate. 2. Loss of subcutaneous fat compared to previous imaging is dramatic. 3. COPD. Electronically Signed   By: Zetta Bills M.D.   On: 01/30/2022 10:09        Scheduled Meds:  arformoterol  15 mcg Nebulization BID   And   umeclidinium bromide  1 puff Inhalation Daily   enoxaparin (LOVENOX) injection  40 mg Subcutaneous Q24H   ipratropium-albuterol  3 mL Nebulization TID   lactose free nutrition  237 mL Oral TID BM   methylPREDNISolone (SOLU-MEDROL) injection  60 mg Intravenous Q6H   Continuous Infusions:  azithromycin 500 mg (01/31/22 1024)   cefTRIAXone (ROCEPHIN)  IV 1 g (01/31/22 1141)     LOS: 1 day    Time spent: 35 minutes    Barb Merino, MD [facility] Pager [phone]

## 2022-02-01 ENCOUNTER — Inpatient Hospital Stay (HOSPITAL_COMMUNITY): Payer: Medicare Other

## 2022-02-01 DIAGNOSIS — I6389 Other cerebral infarction: Secondary | ICD-10-CM | POA: Diagnosis not present

## 2022-02-01 DIAGNOSIS — R64 Cachexia: Secondary | ICD-10-CM | POA: Diagnosis not present

## 2022-02-01 DIAGNOSIS — Z515 Encounter for palliative care: Secondary | ICD-10-CM | POA: Diagnosis not present

## 2022-02-01 DIAGNOSIS — I639 Cerebral infarction, unspecified: Secondary | ICD-10-CM | POA: Diagnosis present

## 2022-02-01 DIAGNOSIS — Z7189 Other specified counseling: Secondary | ICD-10-CM

## 2022-02-01 DIAGNOSIS — J189 Pneumonia, unspecified organism: Secondary | ICD-10-CM | POA: Diagnosis not present

## 2022-02-01 DIAGNOSIS — L89152 Pressure ulcer of sacral region, stage 2: Secondary | ICD-10-CM | POA: Diagnosis not present

## 2022-02-01 LAB — ECHOCARDIOGRAM COMPLETE
AR max vel: 1.8 cm2
AV Area VTI: 1.73 cm2
AV Area mean vel: 1.77 cm2
AV Mean grad: 4 mmHg
AV Peak grad: 7.2 mmHg
Ao pk vel: 1.34 m/s
Area-P 1/2: 3.46 cm2
Height: 64 in
S' Lateral: 2.5 cm
Weight: 1354.51 oz

## 2022-02-01 LAB — LIPID PANEL
Cholesterol: 144 mg/dL (ref 0–200)
HDL: 40 mg/dL — ABNORMAL LOW (ref >40)
LDL Cholesterol: 91 mg/dL (ref 0–99)
Total CHOL/HDL Ratio: 3.6 RATIO
Triglycerides: 65 mg/dL (ref <150)
VLDL: 13 mg/dL (ref 0–40)

## 2022-02-01 LAB — PROCALCITONIN: Procalcitonin: <0.1 ng/mL

## 2022-02-01 MED ORDER — ALPRAZOLAM 0.25 MG PO TABS
0.2500 mg | ORAL_TABLET | Freq: Two times a day (BID) | ORAL | Status: DC | PRN
Start: 2022-02-01 — End: 2022-02-08
  Administered 2022-02-01 – 2022-02-07 (×10): 0.25 mg via ORAL
  Filled 2022-02-01 (×10): qty 1

## 2022-02-01 MED ORDER — PREDNISONE 20 MG PO TABS
40.0000 mg | ORAL_TABLET | Freq: Every day | ORAL | Status: DC
  Administered 2022-02-05 – 2022-02-07 (×3): 40 mg via ORAL
  Filled 2022-02-01 (×5): qty 2

## 2022-02-01 MED ORDER — METHYLPREDNISOLONE SODIUM SUCC 125 MG IJ SOLR
60.0000 mg | INTRAMUSCULAR | Status: DC
Start: 2022-02-02 — End: 2022-02-01

## 2022-02-01 NOTE — Progress Notes (Signed)
Echocardiogram 2D Echocardiogram has been performed.  Joette Catching 02/01/2022, 12:15 PM

## 2022-02-01 NOTE — Progress Notes (Addendum)
PROGRESS NOTE    UNIQUE SILLAS  ZHG:[id number] DOB: [birth year] DOA: 01/30/2022 PCP: Ladell Pier, MD    Brief Narrative:  66 year old with history of supraglottic cancer status post radiation, smoker, COPD, hypertension, hyperlipidemia presented to the ER with about 6 days of worsening shortness of breath, coughing up more sputum and also subjective fever at home.  Felt very weak.  She does have chronic aspiration but currently on regular diet.  Used to have PEG tube but none now. In the emergency room, reportedly she was brought on nonrebreather.  WBC count normal.  Chest x-ray shows advanced COPD changes along with left middle lobe infiltrate.  Admitted with aspiration pneumonia and treated with antibiotics and oxygen.  Does not use oxygen at home.  6/29, in the morning rounds patient has weakness of the left wrist started more than 12 hours ago. Investigated and found to have thromboembolic stroke as below.  Assessment & Plan:   Acute ischemic stroke: Unknown whether present on admission. Patient was not a TPA and vascular intervention candidate because of unknown duration of onset, reported symptom onset more than 12 hours in the hospital. Clinical findings, left upper extremity weakness distal more than proximal weakness. MRI of the brain, small acute infarct in the occipital lobe bilaterally cluster of acute infarcts in the right parietal lobe.  Chronic microvascular ischemic changes. CTA head/neck: Extensive atheromatous changes multifocal narrowing, suspected small filling defect along the posterior wall of the proximal cervical right ICA suspicious for thrombus. 2D echocardiogram, pending today. Antiplatelet therapy, none at home.  Will start on aspirin. LDL 90.  Severe protein calorie malnutrition.  Chance of more complication with a statin.  We will hold off on statin. Hemoglobin A1c, recently 4.8. History of extensive smoking.  Case discussed with neurology, Dr. Kerney Elbe.  Patient was out of intervention window on determining cause neurological deficit. She will be completing stroke work-up including MRI, CT angiograms.  2D echocardiogram pending.  Telemetry without evidence of arrhythmia.  See goal of care discussion below. Neurology recommended to continue aspirin.  If 2D echocardiogram does not show any thromboembolism, will need TEE.  Since she is not a scheduled to have more intervention or testing, no need to transfer to [facility]. Work with PT OT and speech.  Will need CIR versus SNF.    Left middle lobe pneumonia, acute hypoxemic respiratory failure in a patient with underlying COPD, smoker: Started on Rocephin azithromycin, some clinical improvement today. Chest physiotherapy, incentive spirometry, deep breathing exercises, sputum induction, mucolytic's and bronchodilators. Sputum cultures, blood cultures, Legionella and streptococcal antigen negative. Supplemental oxygen to keep saturations more than 90%.  On room air today. Significant history of COPD, started on IV steroids.  Start tapering off to oral steroids today.  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  Currently on dysphagia 3 diet. Poor clinical status, will need palliative care discussion. Advance diet as felt safe by speech therapy. Currently needing rehab.  May ultimately need hospice. Throat cancer treated with chemoradiation.  Currently not on active treatment.  Hypernatremia: Sodium still elevated will will treat with half-normal saline.  Recheck tomorrow morning.   Pressure Injury 01/30/22 Coccyx Right;Left;Medial Stage 1 -  Intact skin with non-blanchable redness of a localized area usually over a bony prominence. Red, pink (Active)  01/30/22 1340  Location: Coccyx  Location Orientation: Right;Left;Medial  Staging: Stage 1 -  Intact skin with non-blanchable redness of a  localized  area usually over a bony prominence.  Wound Description (Comments): Red, pink  Present on Admission: Yes   Goal of care discussion: Patient lives at home with her daughter, daughter herself is struggling with mental health issues and has trouble with decision making.  Patient makes her own decisions but currently debilitated with left upper extremity hemiplegia and ongoing illnesses. Patient tells me she cannot go home because her 46-year-old grandson should not see her so sick. She is open to idea for referral to SNF or rehab. Sister [name] at the bedside is designated contact person daughter not healthcare power of attorney. Agreeable to meet with palliative care team, may help documentation also and also designated healthcare power of attorney. No CPR already discussed and documented.  Addendum: 2D echocardiogram did not show any evidence of intracardiac thrombus.  Since patient is on room air now, I discussed with cardiology service and requested TEE.  Pending palliative discussion, TEE is scheduled for Monday, will keep n.p.o. past midnight.  DVT prophylaxis: enoxaparin (LOVENOX) injection 40 mg Start: 01/30/22 2200 Place TED hose Start: 01/30/22 1238   Code Status: DNR Family Communication: Sister at the bedside. Disposition Plan: Status is: Inpatient Remains inpatient appropriate because: Stroke work-up.  IV antibiotics.     Consultants:  Palliative Neurology, curbside  Procedures:  None  Antimicrobials:  Rocephin azithromycin 6/28----   Subjective:  Patient seen and examined.  Breathing has improved.  She does have some difficulty in her neck.  Currently on room air.Patient has some dry cough.  Afebrile. Very worried about unable to use left hand as she is left-hand dependent.  Sister [name] at the bedside.   Discussed about MRI findings, multiple strokes, right-sided stroke that caused weakness in the left hand.  She is agreeable for rehab options. She is  agreeable to talk with palliative care team.  Telemetry without events.  Objective: Vitals:   01/31/22 2121 02/01/22 0546 02/01/22 0748 02/01/22 0753  BP: 116/69 121/76    Pulse: 82 70    Resp: 18 20    Temp: 97.7 F (36.5 C) 97.9 F (36.6 C)    TempSrc:      SpO2: 96% 94% 95% 95%  Weight:      Height:        Intake/Output Summary (Last 24 hours) at 02/01/2022 1149 Last data filed at 01/31/2022 1359 Gross per 24 hour  Intake 587 ml  Output --  Net 587 ml   Filed Weights   01/30/22 1326 01/30/22 1511  Weight: 38.4 kg 38.4 kg    Examination:  General exam: Cachectic.  Frail debilitated.  Chronically sick looking. Respiratory system: Some conducted airway sounds.  Poor bilateral air entry bilateral.  She was on room air on my exam. Cardiovascular system: S1 & S2 heard, RRR. No pedal edema. Gastrointestinal system: Scaphoid abdomen.  Nontender.   Central nervous system: Alert and oriented.  Cranial nerves II through XII normal. Left upper extremity exam, wrist extension and flexion 0/5.  Elbow extension 4/5 Elbow flexion 3/5 Loss of superficial sensation on the left forearm. Rest of the motor exam is normal. Pressure ulcer as above.   Data Reviewed: I have personally reviewed following labs and imaging studies  CBC: Recent Labs  Lab 01/30/22 0937 01/31/22 0457  WBC 10.8* 12.7*  HGB 14.1 14.2  HCT 45.7 45.3  MCV 97.9 97.0  PLT 176 124   Basic Metabolic Panel: Recent Labs  Lab 01/30/22 0937 01/31/22 0457  NA 147* 146*  K 3.5  4.0  CL 115* 103  CO2 26 34*  GLUCOSE 112* 143*  BUN 18 25*  CREATININE 0.46 0.66  CALCIUM 6.7* 9.6   GFR: Estimated Creatinine Clearance: 40.8 mL/min (by C-G formula based on SCr of 0.66 mg/dL). Liver Function Tests: Recent Labs  Lab 01/30/22 0937 01/31/22 0457  AST 28 29  ALT 12 24  ALKPHOS 39 58  BILITOT 1.0 0.7  PROT 5.2* 7.7  ALBUMIN 2.2* 3.2*   No results for input(s): "LIPASE", "AMYLASE" in the last 168  hours. No results for input(s): "AMMONIA" in the last 168 hours. Coagulation Profile: No results for input(s): "INR", "PROTIME" in the last 168 hours. Cardiac Enzymes: No results for input(s): "CKTOTAL", "CKMB", "CKMBINDEX", "TROPONINI" in the last 168 hours. BNP (last 3 results) No results for input(s): "PROBNP" in the last 8760 hours. HbA1C: No results for input(s): "HGBA1C" in the last 72 hours. CBG: No results for input(s): "GLUCAP" in the last 168 hours. Lipid Profile: Recent Labs    02/01/22 0510  CHOL 144  HDL 40*  LDLCALC 91  TRIG 65  CHOLHDL 3.6   Thyroid Function Tests: No results for input(s): "TSH", "T4TOTAL", "FREET4", "T3FREE", "THYROIDAB" in the last 72 hours. Anemia Panel: No results for input(s): "VITAMINB12", "FOLATE", "FERRITIN", "TIBC", "IRON", "RETICCTPCT" in the last 72 hours. Sepsis Labs: Recent Labs  Lab 01/30/22 0937 01/31/22 0457 02/01/22 0510  PROCALCITON <0.10 0.13 <0.10    Recent Results (from the past 240 hour(s))  Culture, blood (Routine X 2) w Reflex to ID Panel     Status: None (Preliminary result)   Collection Time: 01/30/22 12:15 PM   Specimen: BLOOD  Result Value Ref Range Status   Specimen Description   Final    BLOOD BLOOD LEFT FOREARM Performed at [facility], [place] [address], [place], [name] [address]    Special Requests   Final    BOTTLES DRAWN AEROBIC AND ANAEROBIC Blood Culture results may not be optimal due to an inadequate volume of blood received in culture bottles Performed at [place] [address], [place], [place] [address]    Culture   Final    NO GROWTH < 24 HOURS Performed at [facility] [address] [place], [place] [address]    Report Status PENDING  Incomplete  Culture, blood (Routine X 2) w Reflex to ID Panel     Status: None (Preliminary result)   Collection Time: 01/30/22 12:17 PM   Specimen: BLOOD  Result Value Ref Range Status   Specimen  Description   Final    BLOOD LEFT ANTECUBITAL Performed at [facility] [address], [place], [place] [address]    Special Requests   Final    BOTTLES DRAWN AEROBIC ONLY Blood Culture adequate volume Performed at [facility] [address], [place], [place] [address]    Culture   Final    NO GROWTH < 24 HOURS Performed at [facility] [address], [place], [place] [address]    Report Status PENDING  Incomplete  Respiratory (~20 pathogens) panel by PCR     Status: Abnormal   Collection Time: 01/30/22 12:20 PM   Specimen: Nasopharyngeal Swab; Respiratory  Result Value Ref Range Status   Adenovirus NOT DETECTED NOT DETECTED Final   Coronavirus 229E NOT DETECTED NOT DETECTED Final    Comment: (NOTE) The Coronavirus on the Respiratory Panel, DOES NOT test for the novel  Coronavirus (2019 nCoV)  Coronavirus HKU1 NOT DETECTED NOT DETECTED Final   Coronavirus NL63 NOT DETECTED NOT DETECTED Final   Coronavirus OC43 NOT DETECTED NOT DETECTED Final   Metapneumovirus NOT DETECTED NOT DETECTED Final   Rhinovirus / Enterovirus NOT DETECTED NOT DETECTED Final   Influenza A NOT DETECTED NOT DETECTED Final   Influenza B NOT DETECTED NOT DETECTED Final   Parainfluenza Virus 1 NOT DETECTED NOT DETECTED Final   Parainfluenza Virus 2 NOT DETECTED NOT DETECTED Final   Parainfluenza Virus 3 NOT DETECTED NOT DETECTED Final   Parainfluenza Virus 4 DETECTED (A) NOT DETECTED Final   Respiratory Syncytial Virus NOT DETECTED NOT DETECTED Final   Bordetella pertussis NOT DETECTED NOT DETECTED Final   Bordetella Parapertussis NOT DETECTED NOT DETECTED Final   Chlamydophila pneumoniae NOT DETECTED NOT DETECTED Final   Mycoplasma pneumoniae NOT DETECTED NOT DETECTED Final    Comment: Performed at [facility], [place] [address], [place], [place] [address]  MRSA Next Gen by PCR, Nasal     Status: None   Collection Time: 01/30/22 12:20 PM   Specimen:  Nasopharyngeal Swab; Nasal Swab  Result Value Ref Range Status   MRSA by PCR Next Gen NOT DETECTED NOT DETECTED Final    Comment: (NOTE) The GeneXpert MRSA Assay (FDA approved for NASAL specimens only), is one component of a comprehensive MRSA colonization surveillance program. It is not intended to diagnose MRSA infection nor to guide or monitor treatment for MRSA infections. Test performance is not FDA approved in patients less than 56 years old. Performed at [facility], [place] [address] [place], [place] [address]          Radiology Studies: CT ANGIO HEAD NECK W WO CM  Result Date: 01/31/2022 CLINICAL DATA:  Follow-up examination for acute stroke. EXAM: CT ANGIOGRAPHY HEAD AND NECK TECHNIQUE: Multidetector CT imaging of the head and neck was performed using the standard protocol during bolus administration of intravenous contrast. Multiplanar CT image reconstructions and MIPs were obtained to evaluate the vascular anatomy. Carotid stenosis measurements (when applicable) are obtained utilizing NASCET criteria, using the distal internal carotid diameter as the denominator. RADIATION DOSE REDUCTION: This exam was performed according to the departmental dose-optimization program which includes automated exposure control, adjustment of the mA and/or kV according to patient size and/or use of iterative reconstruction technique. CONTRAST:  49m OMNIPAQUE IOHEXOL 350 MG/ML SOLN COMPARISON:  MRI from earlier the same day. FINDINGS: CT HEAD FINDINGS Brain: Age-related cerebral atrophy with chronic microvascular ischemic disease. Previously identified acute ischemic infarcts involving the posterior right frontoparietal region and occipital lobes, grossly stable. No associated hemorrhage or mass effect. No other acute large vessel territory infarct or hemorrhage. No midline shift. No hydrocephalus or extra-axial fluid collection. Vascular: No hyperdense vessel. Calcified  atherosclerosis present at the skull base. Skull: Scalp soft tissues and calvarium within normal limits. Sinuses: Air-fluid level noted within the left maxillary sinus. Mastoid air cells are clear. Orbits: Globes orbital soft tissues demonstrate no acute finding. Review of the MIP images confirms the above findings CTA NECK FINDINGS Aortic arch: Visualized aortic arch normal in caliber with standard branching pattern. Moderate to advanced atheromatous change about the arch itself. No high-grade stenosis about the origin the great vessels. Right carotid system: Right CCA patent without stenosis. Scattered calcified plaque about the right carotid bulb/proximal right ICA without significant stenosis. Occlusion versus severe stenosis at the origin of the right external carotid artery noted. Distally, there is a small focal filling defect along  the posterior wall of the proximal cervical right ICA (series 11, image 160), suspicious for thrombus. Finding favored to reflect changes due to a recently ruptured plaque, although a possible small focal dissection could also be considered. No significant stenosis. Right ICA patent distally without stenosis or other abnormality. Left carotid system: Left CCA patent without stenosis. Bulky calcified plaque about the left carotid bulb/proximal left ICA without hemodynamically significant greater than 50% stenosis. Severe stenosis versus occlusion noted at the left external carotid artery. Cervical left ICA patent distally without stenosis or other acute finding. Vertebral arteries: Both vertebral arteries arise from subclavian arteries. No significant proximal subclavian artery stenosis. Right vertebral artery dominant. Vertebral arteries patent without stenosis or dissection. Skeleton: No discrete or worrisome osseous lesions. Patient is edentulous. Other neck: No other acute soft tissue abnormality within the neck. Upper chest: Hyperinflation with severe emphysema. Visualized  upper chest demonstrates no other acute finding. Shotty subcentimeter mediastinal lymph nodes noted, which could be reactive. Review of the MIP images confirms the above findings CTA HEAD FINDINGS Anterior circulation: Petrous segments patent bilaterally. Atheromatous change seen throughout the carotid siphons with associated moderate multifocal narrowing. A1 segments, anterior communicating complex common anterior cerebral arteries widely patent. No M1 stenosis or occlusion. No proximal MCA branch occlusion. Distal MCA branches perfused and symmetric. Posterior circulation: Right vertebral artery dominant and patent to the vertebrobasilar junction without stenosis. Left vertebral artery hypoplastic. Superimposed atherosclerotic calcification about the proximal left V4 segment with associated moderate multifocal stenoses. Right PICA patent. Left PICA not well seen. Tiny fenestration noted at the vertebrobasilar junction. Basilar patent distally without stenosis. Superior cerebellar arteries patent bilaterally. Both PCAs primarily supplied via the basilar well perfused or distal aspects. Venous sinuses: Patent allowing for timing the contrast bolus. Anatomic variants: None significant.  No aneurysm. Review of the MIP images confirms the above findings IMPRESSION: CT HEAD IMPRESSION: 1. 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 No associated hemorrhage or mass effect. 2. No other new acute intracranial abnormality. 3. Underlying atrophy with chronic small vessel ischemic disease. CTA HEAD AND NECK IMPRESSION: 1. Small focal filling defect along the posterior wall of the proximal cervical right ICA, suspicious for thrombus. Finding favored to reflect changes due to a recently ruptured plaque, although a possible small focal dissection could also be considered. No significant stenosis. 2. Moderate atheromatous change about the skull base with associated  moderate multifocal narrowing about the siphons and about the proximal left V4 segment. No other hemodynamically significant or correctable stenosis. 3. Emphysema (ICD10-J43.9). Electronically Signed   By: Jeannine Boga M.D.   On: 01/31/2022 22:32   MR BRAIN WO CONTRAST  Result Date: 01/31/2022 CLINICAL DATA:  Acute neuro deficit. EXAM: MRI HEAD WITHOUT CONTRAST TECHNIQUE: Multiplanar, multiecho pulse sequences of the brain and surrounding structures were obtained without intravenous contrast. COMPARISON:  None Available. FINDINGS: Brain: Multiple areas of acute infarct are present. Small acute infarcts in the occipital lobe bilaterally. Patchy areas of acute infarct in the right parietal lobe. No associated hemorrhage. Ventricle size normal. Moderate chronic microvascular ischemic change in the white matter. No mass lesion. Vascular: Normal arterial flow voids in the skull base. Skull and upper cervical spine: No focal abnormality. Sinuses/Orbits: Mucosal edema paranasal sinuses. Left maxillary air-fluid level. Negative orbit Other: None IMPRESSION: Small acute infarcts in the occipital lobe bilaterally. Cluster of acute infarcts in the right parietal lobe. Findings suggest emboli given different vascular distributions. Moderate chronic microvascular ischemic change  in the white matter. No acute hemorrhage Electronically Signed   By: Franchot Gallo M.D.   On: 01/31/2022 16:59        Scheduled Meds:  albuterol  2.5 mg Nebulization BID   arformoterol  15 mcg Nebulization BID   And   umeclidinium bromide  1 puff Inhalation Daily   aspirin EC  81 mg Oral Daily   enoxaparin (LOVENOX) injection  40 mg Subcutaneous Q24H   feeding supplement  237 mL Oral 5 X Daily   [START ON 02/02/2022] predniSONE  40 mg Oral Q breakfast   Continuous Infusions:  azithromycin 500 mg (01/31/22 1024)   cefTRIAXone (ROCEPHIN)  IV Stopped (02/01/22 0931)     LOS: 2 days    Time spent: 35 minutes    Barb Merino, MD [facility] Pager [phone]

## 2022-02-01 NOTE — Evaluation (Signed)
Occupational Therapy Evaluation Patient Details Name: Raven Torres MRN: [id number] DOB: [birth year] Today's Date: 02/01/2022   History of Present Illness 66 y.o. female with medical history of supraglottic cancer status postradiation, smoker, COPD, hypertension, high cholesterol here for worsening breathing over the past 6 days. Dx of PNA. Found to have wrist drop on 6/29 and MRi positive for CVA.   Clinical Impression   [name] is a 66 year old woman who presents with above medical history. On evaluation she presents with decreased ROM, strength and coordination of left upper extremity, as well as generalized weakness, poor cardiopulmonary endurance and now on 2 L [place] and decreased activity tolerance. Patient needing increased assistance with ADLs, transfer limited today due to needing to go back to bed for echo, but patient min guard for transfer chair to bed. Patient anxious, reports some memory deficits and also had decreased attention span. No overt visual deficits found. Patient will benefit from skilled OT services while in hospital to improve deficits and learn compensatory strategies as needed in order to return to PLOF.      Recommendations for follow up therapy are one component of a multi-disciplinary discharge planning process, led by the attending physician.  Recommendations may be updated based on patient status, additional functional criteria and insurance authorization.   Follow Up Recommendations  Skilled nursing-short term rehab (<3 hours/day)    Assistance Recommended at Discharge Frequent or constant Supervision/Assistance  Patient can return home with the following A little help with walking and/or transfers;A little help with bathing/dressing/bathroom;Assistance with cooking/housework;Help with stairs or ramp for entrance;Direct supervision/assist for medications management;Direct supervision/assist for financial management    Functional Status Assessment   Patient has had a recent decline in their functional status and demonstrates the ability to make significant improvements in function in a reasonable and predictable amount of time.  Equipment Recommendations  Other (comment) (TBD)    Recommendations for Other Services       Precautions / Restrictions Precautions Precautions: Other (comment) Precaution Comments: monitor o2 sat Restrictions Weight Bearing Restrictions: No      Mobility Bed Mobility Overal bed mobility: Needs Assistance Bed Mobility: Sit to Supine     Supine to sit: Min guard     General bed mobility comments: min guard to return to supine    Transfers Overall transfer level: Needs assistance Equipment used: None Transfers: Sit to/from Stand Sit to Stand: Min guard           General transfer comment: min guard to stand and take steps to bed, then stand from bed and get higher up in the bed. Patient on 2 L [place].      Balance Overall balance assessment: Mild deficits observed, not formally tested   Sitting balance-Leahy Scale: Good     Standing balance support: Single extremity supported Standing balance-Leahy Scale: Fair                             ADL either performed or assessed with clinical judgement   ADL Overall ADL's : Needs assistance/impaired Eating/Feeding: Set up;Sitting   Grooming: Set up;Sitting   Upper Body Bathing: Minimal assistance;Sitting   Lower Body Bathing: Minimal assistance;Sit to/from stand   Upper Body Dressing : Minimal assistance;Sitting Upper Body Dressing Details (indicate cue type and reason): shirt with no fasteners Lower Body Dressing: Moderate assistance;Sit to/from stand   Toilet Transfer: Min guard;BSC/3in1   Toileting- Water quality scientist and Hygiene: Minimal  assistance;Sit to/from stand       Functional mobility during ADLs: Min guard General ADL Comments: Patient able to use right had to compensate but still needing min-mod assist.  Also limited by poor activity tolerance and breathing difficulties.     Vision Baseline Vision/History: 1 Wears glasses Additional Comments: Wears readers, no complaints of blurry vision, no complaints of double vision, motor control grossly intact, able to read menu, able to locate fingers and count in each quadrant     Perception     Praxis      Pertinent Vitals/Pain Pain Assessment Pain Assessment: No/denies pain     Hand Dominance Left   Extremity/Trunk Assessment Upper Extremity Assessment Upper Extremity Assessment: RUE deficits/detail;LUE deficits/detail RUE Deficits / Details: WFL ROM, grossly 4/5 strength - limited assessment as patient complaining of pain when therapist pressed for resistance training RUE Sensation: WNL RUE Coordination: WNL LUE Deficits / Details: WFL ROM of shoulder and elbow, 4-/5 shoulder and elbow strength (patient also complaining of discomfort with strength testing), grossly could supninate but poor pronation, could extend wrist when therapist stabilized forearm, trace movement in thumb but none in digits 2-5. LUE Coordination: decreased gross motor;decreased fine motor   Lower Extremity Assessment Lower Extremity Assessment: Defer to PT evaluation   Cervical / Trunk Assessment Cervical / Trunk Assessment: Normal   Communication Communication Communication: HOH   Cognition Arousal/Alertness: Awake/alert Behavior During Therapy: Anxious Overall Cognitive Status: Within Functional Limits for tasks assessed                                 General Comments: some mild memory issues, anxiety, poor attention     General Comments       Exercises     Shoulder Instructions      Home Living Family/patient expects to be discharged to:: Private residence Living Arrangements: Children Available Help at Discharge: Family;Available PRN/intermittently   Home Access: Stairs to enter Entrance Stairs-Number of Steps: 4   Home  Layout: One level         Biochemist, clinical: Standard     Home Equipment: Grab bars - tub/shower   Additional Comments: daughter plans to get a shower seat      Prior Functioning/Environment Prior Level of Function : Independent/Modified Independent;Driving             Mobility Comments: walks without AD ADLs Comments: independent        OT Problem List: Decreased strength;Decreased range of motion;Decreased activity tolerance;Decreased coordination;Decreased knowledge of use of DME or AE;Impaired UE functional use      OT Treatment/Interventions: Self-care/ADL training;Therapeutic exercise;Neuromuscular education;DME and/or AE instruction;Therapeutic activities;Balance training;Patient/family education    OT Goals(Current goals can be found in the care plan section) Acute Rehab OT Goals Patient Stated Goal: to improve left hand OT Goal Formulation: With patient Time For Goal Achievement: 02/15/22 Potential to Achieve Goals: Good  OT Frequency: Min 2X/week    Co-evaluation              AM-PAC OT "6 Clicks" Daily Activity     Outcome Measure Help from another person eating meals?: A Little Help from another person taking care of personal grooming?: A Little Help from another person toileting, which includes using toliet, bedpan, or urinal?: A Little Help from another person bathing (including washing, rinsing, drying)?: A Little Help from another person to put on and taking off regular upper body clothing?: A  Little Help from another person to put on and taking off regular lower body clothing?: A Lot 6 Click Score: 17   End of Session Equipment Utilized During Treatment: Oxygen Nurse Communication: Mobility status  Activity Tolerance: Patient tolerated treatment well Patient left: in bed;with call bell/phone within reach;with bed alarm set  OT Visit Diagnosis: Hemiplegia and hemiparesis Hemiplegia - Right/Left: Left Hemiplegia - dominant/non-dominant:  Dominant Hemiplegia - caused by: Cerebral infarction                Time: 2574-9355 OT Time Calculation (min): 29 min Charges:  OT General Charges $OT Visit: 1 Visit OT Evaluation $OT Eval Low Complexity: 1 Low OT Treatments $Therapeutic Activity: 8-22 mins  Kyi Romanello, OTR/L [place] [phone] Pager: [place] 02/01/2022, 1:12 PM

## 2022-02-01 NOTE — Plan of Care (Signed)

## 2022-02-01 NOTE — NC FL2 (Signed)
[place] LEVEL OF CARE SCREENING TOOL     IDENTIFICATION  Patient Name: Raven Torres Birthdate: Nov 25, 1955 Sex: female Admission Date (Current Location): 01/30/2022  [facility] and [place] Number:  Herbalist and Address:  [facility],  [address] [place], [place]      Provider Number: [id number]  Attending Physician Name and Address:  Barb Merino, MD  Relative Name and Phone Number:  Charnika, Herbst [phone]    Current Level of Care: Hospital Recommended Level of Care: [place] Prior Approval Number:    Date Approved/Denied:   PASRR Number: [id number]  Discharge Plan: SNF    Current Diagnoses: Patient Active Problem List   Diagnosis Date Noted   Acute ischemic stroke ([place]) 02/01/2022   Pressure injury of skin 01/31/2022   Pneumonia 01/30/2022   Protein-calorie malnutrition, severe 09/13/2020   Hypotension 09/12/2020   Tobacco dependence 08/11/2020   Malignant neoplasm of supraglottis ([place]) 07/13/2020   Herpes zoster without complication 85/46/2703   GOLD COPD III B 04/23/2018   Pruritic dermatitis 04/23/2018   High cholesterol 06/24/2017   Shortness of breath 06/04/2017   Coronary artery calcification 06/04/2017   Other fatigue 06/04/2017   Centrilobular emphysema ([place]) 05/15/2017   Atherosclerosis of arteries 05/15/2017   Poor memory 05/15/2017   Cigarette nicotine dependence without complication 50/04/3817   Lung nodules 04/16/2017   TB lung, latent 01/22/2017   Other cirrhosis of liver ([place]) 01/22/2017   Long-term use of high-risk medication 12/31/2016   Rheumatoid arthritis involving multiple sites with positive rheumatoid factor ([place]) 12/31/2016   Diabetes mellitus screening 11/19/2016   Fibromyalgia 05/15/2015   Rheumatoid arthritis ([place]) 08/22/2014   HTN (hypertension) 08/22/2014   Tobacco abuse 08/22/2014   Hepatic cirrhosis ([place]) 05/31/2014   Chronic hepatitis C virus  infection ([place]) 05/03/2014    Orientation RESPIRATION BLADDER Height & Weight     Self, Time, Situation, Place  O2 Continent Weight: 84 lb 10.5 oz (38.4 kg) Height:  '5\' 4"'$  (162.6 cm)  BEHAVIORAL SYMPTOMS/MOOD NEUROLOGICAL BOWEL NUTRITION STATUS      Continent    AMBULATORY STATUS COMMUNICATION OF NEEDS Skin   Limited Assist Verbally PU Stage and Appropriate Care PU Stage 1 Dressing: Daily                     Personal Care Assistance Level of Assistance  Bathing, Dressing, Feeding, Total care Bathing Assistance: Limited assistance Feeding assistance: Limited assistance Dressing Assistance: Limited assistance Total Care Assistance: Limited assistance   Functional Limitations Info  Sight, Hearing, Speech Sight Info: Adequate Hearing Info: Adequate Speech Info: Adequate    SPECIAL CARE FACTORS FREQUENCY  PT (By licensed PT), OT (By licensed OT)     PT Frequency: 5x/wk OT Frequency: 5x/wk            Contractures Contractures Info: Not present    Additional Factors Info  Code Status, Allergies Code Status Info: DNR Allergies Info: Caprice Renshaw Pressair (Aclidinium Bromide), Amitriptyline, Codeine, Tramadol           Current Medications (02/01/2022):  This is the current hospital active medication list Current Facility-Administered Medications  Medication Dose Route Frequency Provider Last Rate Last Admin   albuterol (PROVENTIL) (2.5 MG/3ML) 0.083% nebulizer solution 2.5 mg  2.5 mg Nebulization BID Barb Merino, MD   2.5 mg at 02/01/22 0748   ALPRAZolam (XANAX) tablet 0.25 mg  0.25 mg Oral BID PRN Barb Merino, MD   0.25 mg at  02/01/22 1101   arformoterol (BROVANA) nebulizer solution 15 mcg  15 mcg Nebulization BID Lorelei Pont, MD   15 mcg at 02/01/22 5364   And   umeclidinium bromide (INCRUSE ELLIPTA) 62.5 MCG/ACT 1 puff  1 puff Inhalation Daily Lorelei Pont, MD   1 puff at 02/01/22 0757   aspirin EC tablet 81 mg  81 mg Oral Daily Barb Merino, MD   81 mg  at 02/01/22 0924   azithromycin (ZITHROMAX) 500 mg in sodium chloride 0.9 % 250 mL IVPB  500 mg Intravenous Q24H Ulyess Blossom C, MD 250 mL/hr at 02/01/22 1350 500 mg at 02/01/22 1350   cefTRIAXone (ROCEPHIN) 1 g in sodium chloride 0.9 % 100 mL IVPB  1 g Intravenous Q24H Ulyess Blossom C, MD 200 mL/hr at 02/01/22 1300 Restarted at 02/01/22 1300   enoxaparin (LOVENOX) injection 40 mg  40 mg Subcutaneous Q24H Ulyess Blossom C, MD   40 mg at 01/31/22 2114   feeding supplement (ENSURE ENLIVE / ENSURE PLUS) liquid 237 mL  237 mL Oral 5 X Daily Barb Merino, MD   237 mL at 02/01/22 1100   lidocaine (XYLOCAINE) 2 % viscous mouth solution 15 mL  15 mL Mouth/Throat Q4H PRN Lorelei Pont, MD       Oral care mouth rinse  15 mL Mouth Rinse PRN Lorelei Pont, MD       [START ON 02/02/2022] predniSONE (DELTASONE) tablet 40 mg  40 mg Oral Q breakfast Barb Merino, MD         Discharge Medications: Please see discharge summary for a list of discharge medications.  Relevant Imaging Results:  Relevant Lab Results:   Additional Information SSN: [id number]  Vassie Moselle, LCSW

## 2022-02-01 NOTE — Consult Note (Signed)
Consultation Note Date: 02/01/2022   Patient Name: Raven Torres  DOB: September 21, 1955  MRN: [id number]  Age / Sex: 66 y.o., female  PCP: Raven Pier, MD Referring Physician: Barb Merino, MD  Reason for Consultation: Establishing goals of care  HPI/Patient Profile: 66 y.o. 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 Admitted with acute COPD exacerbation. Noted cachexia with significant weight loss and previously declined feeding tube. MRI with evidence of multiple small acute infarcts.   Clinical Assessment and Goals of Care: I met today with Raven Torres and no family/visitors at bedside. She is awake and alert. She has chronic aspiration risk with cough. She tells me that she has COPD and reflects that she has continued to smoke knowing that this is worsening her health problems. She is saddened by her stroke effects and concerned about weakness in her left hand as she tells me she is left handed. At this time she tells me that she is hopeful for rehabilitation and to continued not to smoke. She wishes to find her own small apartment after leaving rehab stay. She is already in contact with [employer] She currently lives with her daughter and grandson but she feels she needs her own space to relax and work on her own health.   We discussed concern for her declining health and declining weight. She is clear on her wishes for DNR (already in place). She also shares that she would NEVER want another feeding tube. She is hopeful that she can continue to work on her intake to try and improve her strength and health. She does not believe she needs hospice yet (she seemed open to the help of hospice in the  future). She tells me about her faith and [place]. She talks of having faith and strength and peace through nature like her grandmother.   All questions/concerns addressed. Emotional support provided.   Primary Decision Maker PATIENT She designates her sister Raven Torres as her Air traffic controller - will ask spiritual care to help with official HCPOA    SUMMARY OF RECOMMENDATIONS   - DNR - Hopeful for rehab - Outpatient palliative to follow - NO feeding tube  Code Status/Advance Care Planning: DNR   Symptom Management:  Per attending.   Palliative Prophylaxis:  Aspiration, Bowel Regimen, Delirium Protocol, and Frequent Pain Assessment  Prognosis:  Overall prognosis poor. Would be eligible for hospice services given failure to thrive and progressive weight loss when desired.   Discharge Planning: [facility] for rehab with Palliative care service follow-up      Primary Diagnoses: Present on Admission:  Pneumonia  Pressure injury of skin  Tobacco abuse   I have reviewed the medical record, interviewed the patient and family, and examined the patient. The following aspects are pertinent.  Past Medical History:  Diagnosis Date   Arthritis    Cataract    removed both eyes    Colitis    COPD (chronic obstructive pulmonary disease) ([place])    Hepatitis C    Hyperlipidemia    Hypertension    Shingles 03/27/2020   TB (tuberculosis)    treatment    Social History   Socioeconomic History   Marital status: Married    Spouse name: Not on file   Number of children: Not on file   Years of education: Not on file   Highest education level: Not on file  Occupational History   Not on file  Tobacco Use   Smoking status: Every Day    Packs/day: 1.00    Years: 51.00    Total pack years: 51.00    Types: Cigarettes    Start date: 71   Smokeless tobacco: Never   Tobacco comments:    4 cigs a day   Vaping Use   Vaping  Use: Never used  Substance and Sexual Activity   Alcohol use: No    Alcohol/week: 0.0 standard drinks of alcohol   Drug use: No   Sexual activity: Not Currently  Other Topics Concern   Not on file  Social History Narrative   Not on file   Social Determinants of Health   Financial Resource Strain: Not on file  Food Insecurity: No Food Insecurity (07/11/2020)   Hunger Vital Sign    Worried About Running Out of Food in the Last Year: Never true    Ran Out of Food in the Last Year: Never true  Transportation Needs: No Transportation Needs (07/11/2020)   PRAPARE - Hydrologist (Medical): No    Lack of Transportation (Non-Medical): No  Physical Activity: Not on file  Stress: Not on file  Social Connections: Not on file   Family History  Problem Relation Age of Onset   Heart disease Mother    Cancer Mother    Melanoma Mother    Esophageal cancer Father    Colon cancer Neg Hx    Colon polyps Neg Hx    Rectal cancer Neg Hx    Stomach cancer Neg Hx    Scheduled Meds:  albuterol  2.5 mg Nebulization BID   arformoterol  15 mcg Nebulization BID   And   umeclidinium bromide  1 puff Inhalation Daily   aspirin EC  81 mg Oral Daily   enoxaparin (LOVENOX) injection  40 mg Subcutaneous Q24H   feeding supplement  237 mL Oral 5 X Daily   [START ON 02/02/2022] predniSONE  40 mg Oral Q breakfast   Continuous Infusions:  azithromycin 500 mg (02/01/22 1350)   cefTRIAXone (ROCEPHIN)  IV 200 mL/hr at 02/01/22 1300   PRN Meds:.ALPRAZolam, lidocaine, mouth rinse Allergies  Allergen Reactions   Tudorza Pressair [Aclidinium Bromide] Itching   Amitriptyline Other (See Comments)    Eye spasms per pt   Codeine Nausea Only   Tramadol Nausea Only   Review of Systems  Constitutional:  Positive for activity change and unexpected weight change.    Physical Exam Vitals and nursing note reviewed.  Constitutional:      General: She is not in acute distress.     Appearance: She is cachectic. She is ill-appearing.  Cardiovascular:     Rate and Rhythm: Normal rate.  Pulmonary:     Effort: No tachypnea, accessory muscle usage or respiratory distress.  Abdominal:     General: Abdomen is  flat.  Neurological:     Mental Status: She is alert and oriented to person, place, and time.     Vital Signs: BP 121/76 (BP Location: Left Arm)   Pulse 70   Temp 97.9 F (36.6 C)   Resp 20   Ht '5\' 4"'  (1.626 m)   Wt 38.4 kg   SpO2 95%   BMI 14.53 kg/m  Pain Scale: 0-10   Pain Score: 0-No pain   SpO2: SpO2: 95 % O2 Device:SpO2: 95 % O2 Flow Rate: .O2 Flow Rate (L/min): 2 L/min (pt found on 2l [place])  IO: Intake/output summary: No intake or output data in the 24 hours ending 02/01/22 1444  LBM: Last BM Date : 01/30/22 Baseline Weight: Weight: 38.4 kg Most recent weight: Weight: 38.4 kg     Palliative Assessment/Data:     Time In: 1515  Time Total: 75 min  Greater than 50%  of this time was spent counseling and coordinating care related to the above assessment and plan.  Signed by: Vinie Sill, NP Palliative Medicine Team Pager # [phone] (M-F 8a-5p) Team Phone # [phone] (Nights/Weekends)

## 2022-02-01 NOTE — TOC Initial Note (Signed)
Transition of Care [facility]) - Initial/Assessment Note    Patient Details  Name: Raven Torres MRN: [id number] Date of Birth: September 16, 1955  Transition of Care [facility]) CM/SW Contact:    Raven Moselle, LCSW Phone Number: 02/01/2022, 3:17 PM  Clinical Narrative:                 Met with pt and sister at bedside to discuss discharge plans. Pt originally recommended for HHPT however, since further medical work up it was discovered pt had acute stroke and recommendation changed for SNF placement. Pt and sister agreeable to SNF placement for this pt and have no preference for facility at this time. Pt has been referred out for SNF and currently awaiting bed offers.   Expected Discharge Plan: Skilled Nursing Facility Barriers to Discharge: Continued Medical Work up   Patient Goals and CMS Choice Patient states their goals for this hospitalization and ongoing recovery are:: To return home   Choice offered to / list presented to : Patient, Sibling  Expected Discharge Plan and Services Expected Discharge Plan: [place] In-house Referral: Clinical Social Work Discharge Planning Services: CM Consult Post Acute Care Choice: [place] Living arrangements for the past 2 months: Single Family Home                                      Prior Living Arrangements/Services Living arrangements for the past 2 months: Single Family Home Lives with:: Self Patient language and need for interpreter reviewed:: Yes Do you feel safe going back to the place where you live?: Yes      Need for Family Participation in Patient Care: Yes (Comment) Care giver support system in place?: No (comment)   Criminal Activity/Legal Involvement Pertinent to Current Situation/Hospitalization: No - Comment as needed  Activities of Daily Living Home Assistive Devices/Equipment: None ADL Screening (condition at time of admission) Patient's cognitive ability adequate to safely complete  daily activities?: No Is the patient deaf or have difficulty hearing?: No Does the patient have difficulty seeing, even when wearing glasses/contacts?: No Does the patient have difficulty concentrating, remembering, or making decisions?: No Patient able to express need for assistance with ADLs?: Yes Does the patient have difficulty dressing or bathing?: Yes Independently performs ADLs?: Yes (appropriate for developmental age) Does the patient have difficulty walking or climbing stairs?: Yes Weakness of Legs: Both Weakness of Arms/Hands: Both  Permission Sought/Granted Permission sought to share information with : Facility Sport and exercise psychologist, Family Supports Permission granted to share information with : Yes, Verbal Permission Granted  Share Information with NAME: Raven Torres     Permission granted to share info w Relationship: Sister  Permission granted to share info w Contact Information: [phone]  Emotional Assessment Appearance:: Appears older than stated age Attitude/Demeanor/Rapport: Engaged, Gracious Affect (typically observed): Accepting, Hopeful Orientation: : Oriented to Self, Oriented to Place, Oriented to  Time, Oriented to Situation Alcohol / Substance Use: Tobacco Use Psych Involvement: No (comment)  Admission diagnosis:  Pneumonia [J18.9] Acute on chronic respiratory failure with hypoxia (HCC) [J96.21] Community acquired pneumonia of left lung, unspecified part of lung [J18.9] Patient Active Problem List   Diagnosis Date Noted   Acute ischemic stroke ([place]) 02/01/2022   Pressure injury of skin 01/31/2022   Pneumonia 01/30/2022   Protein-calorie malnutrition, severe 09/13/2020   Hypotension 09/12/2020   Tobacco dependence 08/11/2020   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  Herpes zoster without complication 02/33/4356   GOLD COPD III B 04/23/2018   Pruritic dermatitis 04/23/2018   High cholesterol 06/24/2017   Shortness of breath  06/04/2017   Coronary artery calcification 06/04/2017   Other fatigue 06/04/2017   Centrilobular emphysema ([place]) 05/15/2017   Atherosclerosis of arteries 05/15/2017   Poor memory 05/15/2017   Cigarette nicotine dependence without complication 86/16/8372   Lung nodules 04/16/2017   TB lung, latent 01/22/2017   Other cirrhosis of liver ([place]) 01/22/2017   Long-term use of high-risk medication 12/31/2016   Rheumatoid arthritis involving multiple sites with positive rheumatoid factor ([place]) 12/31/2016   Diabetes mellitus screening 11/19/2016   Fibromyalgia 05/15/2015   Rheumatoid arthritis ([place]) 08/22/2014   HTN (hypertension) 08/22/2014   Tobacco abuse 08/22/2014   Hepatic cirrhosis ([place]) 05/31/2014   Chronic hepatitis C virus infection ([place]) 05/03/2014   PCP:  Ladell Pier, MD Pharmacy:   [facility] [place], [address] AT [address] [address] Phone: [phone] Fax: [phone]     Social Determinants of Health (SDOH) Interventions    Readmission Risk Interventions     No data to display

## 2022-02-02 ENCOUNTER — Inpatient Hospital Stay (HOSPITAL_COMMUNITY): Payer: Medicare Other

## 2022-02-02 DIAGNOSIS — J189 Pneumonia, unspecified organism: Secondary | ICD-10-CM | POA: Diagnosis not present

## 2022-02-02 DIAGNOSIS — I639 Cerebral infarction, unspecified: Secondary | ICD-10-CM

## 2022-02-02 DIAGNOSIS — J9601 Acute respiratory failure with hypoxia: Secondary | ICD-10-CM | POA: Diagnosis present

## 2022-02-02 LAB — CBC WITH DIFFERENTIAL/PLATELET
Abs Immature Granulocytes: 0.05 10*3/uL (ref 0.00–0.07)
Basophils Absolute: 0 10*3/uL (ref 0.0–0.1)
Basophils Relative: 0 %
Eosinophils Absolute: 0 10*3/uL (ref 0.0–0.5)
Eosinophils Relative: 0 %
HCT: 42 % (ref 36.0–46.0)
Hemoglobin: 13.1 g/dL (ref 12.0–15.0)
Immature Granulocytes: 0 %
Lymphocytes Relative: 6 %
Lymphs Abs: 0.6 10*3/uL — ABNORMAL LOW (ref 0.7–4.0)
MCH: 30.3 pg (ref 26.0–34.0)
MCHC: 31.2 g/dL (ref 30.0–36.0)
MCV: 97.2 fL (ref 80.0–100.0)
Monocytes Absolute: 0.7 10*3/uL (ref 0.1–1.0)
Monocytes Relative: 6 %
Neutro Abs: 10.1 10*3/uL — ABNORMAL HIGH (ref 1.7–7.7)
Neutrophils Relative %: 88 %
Platelets: 169 10*3/uL (ref 150–400)
RBC: 4.32 MIL/uL (ref 3.87–5.11)
RDW: 12.5 % (ref 11.5–15.5)
WBC: 11.5 10*3/uL — ABNORMAL HIGH (ref 4.0–10.5)
nRBC: 0 % (ref 0.0–0.2)

## 2022-02-02 LAB — BASIC METABOLIC PANEL
Anion gap: <3 — ABNORMAL LOW (ref 5–15)
BUN: 30 mg/dL — ABNORMAL HIGH (ref 8–23)
CO2: 36 mmol/L — ABNORMAL HIGH (ref 22–32)
Calcium: 9.1 mg/dL (ref 8.9–10.3)
Chloride: 103 mmol/L (ref 98–111)
Creatinine, Ser: 0.52 mg/dL (ref 0.44–1.00)
GFR, Estimated: >60 mL/min (ref >60)
Glucose, Bld: 82 mg/dL (ref 70–99)
Potassium: 4.8 mmol/L (ref 3.5–5.1)
Sodium: 141 mmol/L (ref 135–145)

## 2022-02-02 LAB — PHOSPHORUS: Phosphorus: 2.9 mg/dL (ref 2.5–4.6)

## 2022-02-02 LAB — MAGNESIUM: Magnesium: 2.4 mg/dL (ref 1.7–2.4)

## 2022-02-02 LAB — HEMOGLOBIN A1C
Hgb A1c MFr Bld: 5 % (ref 4.8–5.6)
Mean Plasma Glucose: 96.8 mg/dL

## 2022-02-02 MED ORDER — BUDESONIDE 0.5 MG/2ML IN SUSP
0.5000 mg | Freq: Two times a day (BID) | RESPIRATORY_TRACT | Status: DC
Start: 2022-02-02 — End: 2022-02-08
  Administered 2022-02-02 – 2022-02-07 (×11): 0.5 mg via RESPIRATORY_TRACT
  Filled 2022-02-02 (×11): qty 2

## 2022-02-02 MED ORDER — STROKE: EARLY STAGES OF RECOVERY BOOK
Freq: Once | Status: AC
  Filled 2022-02-02: qty 1

## 2022-02-02 MED ORDER — ASPIRIN 81 MG PO CHEW
81.0000 mg | CHEWABLE_TABLET | Freq: Every day | ORAL | Status: DC
  Administered 2022-02-03 – 2022-02-07 (×5): 81 mg via ORAL
  Filled 2022-02-02 (×5): qty 1

## 2022-02-02 MED ORDER — ATORVASTATIN CALCIUM 40 MG PO TABS
40.0000 mg | ORAL_TABLET | Freq: Every day | ORAL | Status: DC
  Administered 2022-02-02 – 2022-02-06 (×5): 40 mg via ORAL
  Filled 2022-02-02 (×5): qty 1

## 2022-02-02 MED ORDER — IPRATROPIUM-ALBUTEROL 0.5-2.5 (3) MG/3ML IN SOLN
3.0000 mL | Freq: Four times a day (QID) | RESPIRATORY_TRACT | Status: DC
Start: 2022-02-02 — End: 2022-02-05
  Administered 2022-02-02 – 2022-02-04 (×8): 3 mL via RESPIRATORY_TRACT
  Filled 2022-02-02 (×9): qty 3

## 2022-02-02 MED ORDER — ALBUTEROL SULFATE (2.5 MG/3ML) 0.083% IN NEBU
2.5000 mg | INHALATION_SOLUTION | RESPIRATORY_TRACT | Status: DC | PRN
  Administered 2022-02-02: 2.5 mg via RESPIRATORY_TRACT
  Filled 2022-02-02: qty 3

## 2022-02-02 MED ORDER — ATORVASTATIN CALCIUM 40 MG PO TABS: 40.0000 mg | ORAL_TABLET | Freq: Every day | ORAL | Status: DC

## 2022-02-02 NOTE — Evaluation (Signed)
Clinical/Bedside Swallow Evaluation Patient Details  Name: Raven Torres MRN: [id number] Date of Birth: 1956-02-09  Today's Date: 02/02/2022 Time: SLP Start Time (ACUTE ONLY): 1500 SLP Stop Time (ACUTE ONLY): 1525 SLP Time Calculation (min) (ACUTE ONLY): 25 min  Past Medical History:  Past Medical History:  Diagnosis Date   Arthritis    Cataract    removed both eyes    Colitis    COPD (chronic obstructive pulmonary disease) ([place])    Hepatitis C    Hyperlipidemia    Hypertension    Shingles 03/27/2020   TB (tuberculosis)    treatment    Past Surgical History:  Past Surgical History:  Procedure Laterality Date   ABDOMINAL HYSTERECTOMY     CATARACT EXTRACTION, BILATERAL     COLONOSCOPY     COLONOSCOPY  04/04/2020   DIRECT LARYNGOSCOPY N/A 06/26/2020   Procedure: DIRECT LARYNGOSCOPY AND BIOPSY;  Surgeon: Rozetta Nunnery, MD;  Location: [place];  Service: ENT;  Laterality: N/A;   IR GASTROSTOMY TUBE MOD SED  07/27/2020   IR GASTROSTOMY TUBE REMOVAL  04/17/2021   IR IMAGING GUIDED PORT INSERTION  07/27/2020   IR REMOVAL TUN ACCESS W/ PORT W/O FL MOD SED  03/20/2021   KNEE SURGERY Right    POLYPECTOMY  11/17/2008   HPP x 1    HPI:  66 yo female adm to [facility] with respiratory difficulties, CXR concerning for pna.  Pt has complex medical hx including COPD COVID 19 09/2020, supralaryngeal cancer x/p chemo/XRT, continues to smoke, moderate to severe esophageal dysmotility diagnosed 11/2021 per esophagram, mild pharyngeal based dysphagia diagnosed as an OP 12/14/2021.  She has had a PEG in the past that was removed.  CXR concerning for left lung infectious pna and pt requiring NRB for respiratory support. Pt admits to weight loss ongoing but reports her swallowing is "better".  Bedside swallow ordered and completed 01/30/22. On 02/02/22, patient made NPO and BSE reordered.    Assessment / Plan / Recommendation  Clinical Impression  Patient's swallow function does not appear to have changed  significantly since initial bedside swallow evaluation that was completed on 01/30/22. In addition, likely not much change since MBS on 12/14/21. During today's evaluation, patient demonstrated recall and use of chin tuck posture with swallows of thin liquids. She exhibited prolonged mastication of soft solids but no overt s/s aspiration or penetration with any of the tested boluses. She is completely aphonic. After some sips of liquids she thought she could bring some phlegm up by coughing but was unsuccessful and attributes this to being dry from being NPO all day. SLP is recommending to start patient back on Dys 3 solids, thin liquids diet as before and will continue to monitor for toleration, use of swallow precautions and patient/family education. SLP Visit Diagnosis: Dysphagia, unspecified (R13.10)    Aspiration Risk  Risk for inadequate nutrition/hydration;Moderate aspiration risk    Diet Recommendation Thin liquid;Dysphagia 3 (Mech soft)   Liquid Administration via: Cup;Straw Medication Administration: Crushed with puree Supervision: Intermittent supervision to cue for compensatory strategies Compensations: Small sips/bites;Slow rate;Follow solids with liquid Postural Changes: Seated upright at 90 degrees;Remain upright for at least 30 minutes after po intake    Other  Recommendations Oral Care Recommendations: Oral care BID    Recommendations for follow up therapy are one component of a multi-disciplinary discharge planning process, led by the attending physician.  Recommendations may be updated based on patient status, additional functional criteria and insurance authorization.  Follow up Recommendations  Other (comment) (TBD)      Assistance Recommended at Discharge Frequent or constant Supervision/Assistance  Functional Status Assessment Patient has had a recent decline in their functional status and demonstrates the ability to make significant improvements in function in a reasonable  and predictable amount of time.  Frequency and Duration min 2x/week  1 week       Prognosis Prognosis for Safe Diet Advancement: Fair Barriers to Reach Goals: Time post onset;Severity of deficits      Swallow Study   General Date of Onset: 11/07/21 HPI: 66 yo female adm [facility] with respiratory difficulties, CXR concerning for pna.  Pt has complex medical hx including COPD COVID 19 09/2020, supralaryngeal cancer x/p chemo/XRT, continues to smoke, moderate to severe esophageal dysmotility diagnosed 11/2021 per esophagram, mild pharyngeal based dysphagia diagnosed as an OP 12/14/2021.  She has had a PEG in the past that was removed.  CXR concerning for left lung infectious pna and pt requiring NRB for respiratory support. Pt admits to weight loss ongoing but reports her swallowing is "better".  Bedside swallow ordered and completed 01/30/22. On 02/02/22, patient made NPO and BSE reordered.    Oral/Motor/Sensory Function Overall Oral Motor/Sensory Function: Within functional limits   Ice Chips     Thin Liquid Thin Liquid: Impaired Presentation: Straw;Self Fed Pharyngeal  Phase Impairments: Multiple swallows    Nectar Thick     Honey Thick     Puree Puree: Not tested   Solid     Solid: Impaired Oral Phase Impairments: Impaired mastication Oral Phase Functional Implications: Impaired mastication;Prolonged oral transit      Sonia Baller, MA, CCC-SLP Speech Therapy

## 2022-02-02 NOTE — Progress Notes (Signed)
Carotid artery duplex has been completed. Preliminary results can be found in CV Proc through chart review.   02/02/22 11:24 AM Raven Torres RVT

## 2022-02-02 NOTE — Consult Note (Cosign Needed)
Neurology Consultation  Reason for Consult: Strokes on MRI  Referring Physician: Dr. Starla Link  CC: Respiratory distress   History is obtained from:patient and medical record   HPI: Raven Torres is a 66 y.o. female with past medical history of supraglottic cancer status postradiation, smoker, COPD, hypertension, high cholesterol, hepatitis C who presented to the [facility] ED on 6/28 for worsening trouble breathing over the last week. On the morning of 6/29 she was noted to have left arm, left wrist weakness. MRI revealed acute bilateral infarcts. Neurology consulted for stroke workup   LKW: unknown  IV thrombolysis given?: no, outside window Premorbid modified Rankin scale (mRS):  0-Completely asymptomatic and back to baseline post-stroke  ROS: Full ROS was performed and is negative except as noted in the HPI.    Past Medical History:  Diagnosis Date   Arthritis    Cataract    removed both eyes    Colitis    COPD (chronic obstructive pulmonary disease) (HCC)    Hepatitis C    Hyperlipidemia    Hypertension    Shingles 03/27/2020   TB (tuberculosis)    treatment      Family History  Problem Relation Age of Onset   Heart disease Mother    Cancer Mother    Melanoma Mother    Esophageal cancer Father    Colon cancer Neg Hx    Colon polyps Neg Hx    Rectal cancer Neg Hx    Stomach cancer Neg Hx      Social History:   reports that she has been smoking cigarettes. She started smoking about 52 years ago. She has a 51.00 pack-year smoking history. She has never used smokeless tobacco. She reports that she does not drink alcohol and does not use drugs.  Medications  Current Facility-Administered Medications:    [START ON 02/03/2022]  stroke: early stages of recovery book, , Does not apply, Once, Beulah Gandy A, NP   albuterol (PROVENTIL) (2.5 MG/3ML) 0.083% nebulizer solution 2.5 mg, 2.5 mg, Nebulization, BID, Ghimire, Kuber, MD, 2.5 mg at 02/02/22 0828   ALPRAZolam (XANAX)  tablet 0.25 mg, 0.25 mg, Oral, BID PRN, Barb Merino, MD, 0.25 mg at 02/02/22 0210   arformoterol (BROVANA) nebulizer solution 15 mcg, 15 mcg, Nebulization, BID, 15 mcg at 02/02/22 0833 **AND** umeclidinium bromide (INCRUSE ELLIPTA) 62.5 MCG/ACT 1 puff, 1 puff, Inhalation, Daily, Ulyess Blossom C, MD, 1 puff at 02/02/22 0838   [START ON 02/03/2022] aspirin chewable tablet 81 mg, 81 mg, Oral, Daily, Beulah Gandy A, NP   azithromycin (ZITHROMAX) 500 mg in sodium chloride 0.9 % 250 mL IVPB, 500 mg, Intravenous, Q24H, Ulyess Blossom C, MD, Last Rate: 250 mL/hr at 02/02/22 1117, 500 mg at 02/02/22 1117   cefTRIAXone (ROCEPHIN) 1 g in sodium chloride 0.9 % 100 mL IVPB, 1 g, Intravenous, Q24H, Ulyess Blossom C, MD, Last Rate: 200 mL/hr at 02/01/22 1300, Restarted at 02/01/22 1300   enoxaparin (LOVENOX) injection 40 mg, 40 mg, Subcutaneous, Q24H, Ulyess Blossom C, MD, 40 mg at 02/01/22 2152   feeding supplement (ENSURE ENLIVE / ENSURE PLUS) liquid 237 mL, 237 mL, Oral, 5 X Daily, Ghimire, Kuber, MD, 237 mL at 02/01/22 2152   lidocaine (XYLOCAINE) 2 % viscous mouth solution 15 mL, 15 mL, Mouth/Throat, Q4H PRN, Lorelei Pont, MD   Oral care mouth rinse, 15 mL, Mouth Rinse, PRN, Ulyess Blossom C, MD   predniSONE (DELTASONE) tablet 40 mg, 40 mg, Oral, Q breakfast, Barb Merino, MD  Exam: Current vital signs: BP 107/63 (BP Location: Left Arm)   Pulse 71   Temp 97.8 F (36.6 C)   Resp 17   Ht '5\' 4"'$  (1.626 m)   Wt 38.4 kg   SpO2 94%   BMI 14.53 kg/m  Vital signs in last 24 hours: Temp:  [97.4 F (36.3 C)-98 F (36.7 C)] 97.8 F (36.6 C) (07/01 7619) Pulse Rate:  [71-81] 71 (07/01 0642) Resp:  [17-22] 17 (07/01 0642) BP: (107-115)/(63-74) 107/63 (07/01 5093) SpO2:  [88 %-98 %] 94 % (07/01 0828)  GENERAL: critically ill appearing elderly female, Awake, alert in NAD HEENT: - Normocephalic and atraumatic, dry mm LUNGS - Clear to auscultation bilaterally with no wheezes CV - S1S2 RRR, no m/r/g, equal  pulses bilaterally. ABDOMEN - Soft, nontender, nondistended with normoactive BS Ext: cool, well perfused, intact peripheral pulses, no edema  NEURO:  Mental Status: AA&Ox3  Language: speech is clear, hypophonic.  Naming, repetition, fluency, and comprehension intact. Cranial Nerves: PERRL, EOMI, visual fields full, mild left facial asymmetry at rest, however has symmetric smile, facial sensation intact, hearing intact, tongue/uvula/soft palate midline, normal sternocleidomastoid and trapezius muscle strength. No evidence of tongue atrophy or fibrillations Motor: Left upper extremity 4/5, wrist extension/flexion 0, no drift, Right upper 5/5, bilateral lowers 5/5 Tone: is decreased  Sensation- Intact to light touch bilaterally Coordination: FTN intact bilaterally, no ataxia in BLE. Gait- deferred  NIHSS 1a Level of Conscious.: 0 1b LOC Questions: 0 1c LOC Commands: 0 2 Best Gaze: 0 3 Visual: 0 4 Facial Palsy: 0 5a Motor Arm - left: 0 5b Motor Arm - Right: 0 6a Motor Leg - Left: 0 6b Motor Leg - Right: 0 7 Limb Ataxia: 0 8 Sensory: 0 9 Best Language: 0 10 Dysarthria: 0 11 Extinct. and Inatten.: 0 TOTAL: 0   Labs I have reviewed labs in epic and the results pertinent to this consultation are: LDL-c 91 HGBA1c-5.0  Imaging I have reviewed the images obtained:  MRI examination of the brain -Small acute infarcts in the occipital lobe bilaterally. Cluster of acute infarcts in the right parietal lobe. Findings suggest emboli given different vascular distributions. -Moderate chronic microvascular ischemic change in the white matter. No acute hemorrhage  CTA head/neck  1. Small focal filling defect along the posterior wall of the proximal cervical right ICA, suspicious for thrombus. Finding favored to reflect changes due to a recently ruptured plaque, although a possible small focal dissection could also be considered. No significant stenosis. 2. Moderate atheromatous change about  the skull base with associated moderate multifocal narrowing about the siphons and about the proximal left V4 segment. No other hemodynamically significant or correctable stenosis. 3. Emphysema (ICD10-J43.9).   Echo : EF 60-65%. No shunt  US carotid bilateral: pending   Assessment:   Raven Torres is a 66 y.o. female with past medical history of supraglottic cancer status postradiation, smoker, COPD, hypertension, high cholesterol, hepatitis C who presented to the [facility] ED on 6/28 for worsening trouble breathing over the last week. On the morning of 6/29 she was noted to have left arm, left wrist weakness. On exam today she is alert and awake. Speech is clear, hypophonic, no aphasia or dysarthria noted. PERRL, EOM intact, mild left facial droop at rest, symmetric when smiling, Left upper extremity 4/5 wrist flexion/extension 0/5, not able to wiggle fingers or grip. Sensation appears to be intact, no ataxia noted. Bilateral lowers 5/5  Acute right parietal ischemic infarct, due to cranial atherosclerotic  disease and chronic small vessel disease  Recommendations: As documented by MD.  Beulah Gandy DNP, ACNPC-AG   Attending addendum Patient seen and examined independently History obtained from the patient Daughter was in the room but was sleeping at this time unfortunately. Patient's RN was at the bedside Last known well at least 2 days or so ago. Outside the window for acute intervention I have personally reviewed imaging. MRI suggestive of embolic strokes in multiple vascular territories raising suspicion for a central source-given her malignancy, likely hypercoagulability. TEE was discussed with the day team and cardiology but given the location of her supraglottic cancer, high risk procedure. CT angiography also reviewed personally-given the fact that it has a small focal filling defect along the posterior wall of the proximal cervical right ICA suspicious for thrombus-likely from a  ruptured plaque versus dissection other considerations-I would recommend anticoagulation for stroke prevention my recommendations are as below.  Impression: Acute ischemic stroke-likely embolic secondary to hypercoagulability Small area of filling defect along the posterior wall of the proximal cervical right ICA-ruptured plaque versus dissection.  Recommendations: -Continue aspirin for now -In about 3 days-February 06, 2022, start either full dose Lovenox or Eliquis-would let the primary team discussed with oncology to see what they prefer based on the fact that stroke etiology is likely hypercoagulability secondary to cancer. -LDL goal is less than 70.  Given it is above goal, would recommend continuing atorvastatin 40 mg daily, that she is on. -Hemoglobin A1c is at goal 5.0 -PT OT speech therapy -Follow-up in stroke clinic in [place] -4 weeks after discharge.  Plan discussed with the patient, RN at bedside.  Plan was sent via staff message to Dr. Starla Link  -- Amie Portland, MD Neurologist [facility] Pager: [phone]

## 2022-02-02 NOTE — Plan of Care (Signed)

## 2022-02-02 NOTE — Progress Notes (Signed)
PROGRESS NOTE    Raven Torres  ZHG:[id number] DOB: [birth year] DOA: 01/30/2022 PCP: Ladell Pier, MD   Brief Narrative:  66 year old with history of supraglottic cancer status post radiation, smoker, COPD, hypertension, hyperlipidemia presented to the ER with about 6 days of worsening shortness of breath, coughing up more sputum and also subjective fever at home.  Felt very weak.  She does have chronic aspiration but currently on regular diet.  Used to have PEG tube but none now. In the emergency room, reportedly she was brought on nonrebreather.  WBC count normal.  Chest x-ray showed advanced COPD changes along with left middle lobe infiltrate.  Admitted with aspiration pneumonia and treated with antibiotics and oxygen.  Does not use oxygen at home.   On 01/31/2022, patient was found to have weakness of the left wrist which had started more than 12 hours ago.  She was found to have thromboembolic stroke as below.  Case was discussed with neurology.  Assessment & Plan:   Acute right parietal ischemic infarct -Most likely due to cranial atherosclerotic disease and chronic small vessel disease -Patient was found to have weakness of the left wrist on 01/31/2022 which had started more than 12 hours ago.  Case was discussed with neurology.  Patient was out of the window for any intervention.  Neurology recommended to complete stroke work-up at [facility] and recommended against transfer.  She was found to have infarct as above.  Currently on aspirin 81 mg daily. -CTA head/neck: Extensive atheromatous changes multifocal narrowing, suspected small filling defect along the posterior wall of the proximal cervical right ICA suspicious for thrombus. --LDL 91.  Start Lipitor.  Recent A1c 4.8. -Echo showed EF of 60 to 65% with grade 2 diastolic dysfunction.  Patient is scheduled for TEE on Monday but unclear if she would be a candidate for the same.  Will await cardiology input. -I have asked  neurology for formal consultation. -PT/OT following.  Recommend SNF.  TOC consult.  Acute respiratory failure with hypoxia Left sided community-acquired pneumonia COPD exacerbation -Currently intermittently requiring 2 L oxygen by nasal cannula.  Complete 5-day course of Rocephin and Zithromax. -Solu-Medrol has been switched to oral steroids.  Continue current nebs and inhalers  Subglottic malignancy status postradiation Chronic dysphagia and aspiration Cancer related cachexia/severe protein calorie malnutrition Generalized conditioning/debility -Diet as per SLP recommendations.  Overall prognosis is guarded to poor.  Nutrition following. -Palliative care following.  Patient will need outpatient palliative care follow-up.  Patient interested in SNF placement. -Throat cancer treated with chemoradiation: Currently not on active treatment  Hypernatremia -Resolved  Leukocytosis -Mild.  Monitor intermittently  Right/left/medial coccygeal stage I pressure injury: Present on admission -Agree with documentation as below.  Continue local wound care. Pressure Injury 01/30/22 Coccyx Right;Left;Medial Stage 1 -  Intact skin with non-blanchable redness of a localized area usually over a bony prominence. Red, pink (Active)  01/30/22 1340  Location: Coccyx  Location Orientation: Right;Left;Medial  Staging: Stage 1 -  Intact skin with non-blanchable redness of a localized area usually over a bony prominence.  Wound Description (Comments): Red, pink  Present on Admission: Yes   DVT prophylaxis: Lovenox Code Status: DNR Family Communication: None at bedside Disposition Plan: Status is: Inpatient Remains inpatient appropriate because: Of severity of illness.  Need for SNF placement  Consultants: Palliative care/neurology  Procedures: Echo  Antimicrobials: Rocephin and Zithromax from 01/22/2022 onwards   Subjective: Patient seen and examined at bedside.  Feels weak and is not  able to  produce any phlegm.  No overnight fever or vomiting reported.  Denies any chest pain or worsening shortness of breath.  Objective: Vitals:   02/01/22 2156 02/02/22 0642 02/02/22 0828 02/02/22 1228  BP: 112/67 107/63  119/65  Pulse: 73 71    Resp: (!) '22 17  20  '$ Temp: (!) 97.4 F (36.3 C) 97.8 F (36.6 C)  97.8 F (36.6 C)  TempSrc: Oral   Oral  SpO2: (!) 88% 98% 94% 96%  Weight:      Height:        Intake/Output Summary (Last 24 hours) at 02/02/2022 1350 Last data filed at 02/02/2022 1230 Gross per 24 hour  Intake 791.07 ml  Output --  Net 791.07 ml   Filed Weights   01/30/22 1326 01/30/22 1511  Weight: 38.4 kg 38.4 kg    Examination:  General exam: Appears calm and comfortable.  Looks extremely cachectic and chronically ill and deconditioned. Respiratory system: Bilateral decreased breath sounds at bases with some scattered crackles and intermittent tachypnea Cardiovascular system: S1 & S2 heard, Rate controlled Gastrointestinal system: Abdomen is nondistended, soft and nontender. Normal bowel sounds heard. Extremities: No cyanosis, clubbing, edema  Central nervous system: Alert and oriented. No focal neurological deficits.  Left upper extremity weakness present. Skin: No rashes, lesions or ulcers Psychiatry: Affect is flat.  No signs of agitation.  Data Reviewed: I have personally reviewed following labs and imaging studies  CBC: Recent Labs  Lab 01/30/22 0937 01/31/22 0457 02/02/22 0640  WBC 10.8* 12.7* 11.5*  NEUTROABS  --   --  10.1*  HGB 14.1 14.2 13.1  HCT 45.7 45.3 42.0  MCV 97.9 97.0 97.2  PLT 176 174 761   Basic Metabolic Panel: Recent Labs  Lab 01/30/22 0937 01/31/22 0457 02/02/22 0640  NA 147* 146* 141  K 3.5 4.0 4.8  CL 115* 103 103  CO2 26 34* 36*  GLUCOSE 112* 143* 82  BUN 18 25* 30*  CREATININE 0.46 0.66 0.52  CALCIUM 6.7* 9.6 9.1  MG  --   --  2.4  PHOS  --   --  2.9   GFR: Estimated Creatinine Clearance: 40.8 mL/min (by C-G  formula based on SCr of 0.52 mg/dL). Liver Function Tests: Recent Labs  Lab 01/30/22 0937 01/31/22 0457  AST 28 29  ALT 12 24  ALKPHOS 39 58  BILITOT 1.0 0.7  PROT 5.2* 7.7  ALBUMIN 2.2* 3.2*   No results for input(s): "LIPASE", "AMYLASE" in the last 168 hours. No results for input(s): "AMMONIA" in the last 168 hours. Coagulation Profile: No results for input(s): "INR", "PROTIME" in the last 168 hours. Cardiac Enzymes: No results for input(s): "CKTOTAL", "CKMB", "CKMBINDEX", "TROPONINI" in the last 168 hours. BNP (last 3 results) No results for input(s): "PROBNP" in the last 8760 hours. HbA1C: No results for input(s): "HGBA1C" in the last 72 hours. CBG: No results for input(s): "GLUCAP" in the last 168 hours. Lipid Profile: Recent Labs    02/01/22 0510  CHOL 144  HDL 40*  LDLCALC 91  TRIG 65  CHOLHDL 3.6   Thyroid Function Tests: No results for input(s): "TSH", "T4TOTAL", "FREET4", "T3FREE", "THYROIDAB" in the last 72 hours. Anemia Panel: No results for input(s): "VITAMINB12", "FOLATE", "FERRITIN", "TIBC", "IRON", "RETICCTPCT" in the last 72 hours. Sepsis Labs: Recent Labs  Lab 01/30/22 0937 01/31/22 0457 02/01/22 0510  PROCALCITON <0.10 0.13 <0.10    Recent Results (from the past 240 hour(s))  Culture, blood (Routine X 2)  w Reflex to ID Panel     Status: None (Preliminary result)   Collection Time: 01/30/22 12:15 PM   Specimen: BLOOD  Result Value Ref Range Status   Specimen Description   Final    BLOOD BLOOD LEFT FOREARM Performed at [facility] [address], [place], [place] [address]    Special Requests   Final    BOTTLES DRAWN AEROBIC AND ANAEROBIC Blood Culture results may not be optimal due to an inadequate volume of blood received in culture bottles Performed at [facility] [address], [place], [place] [address]    Culture   Final    NO GROWTH 3 DAYS Performed at [facility], [place] [address], [place], [place] [address]    Report Status PENDING  Incomplete  Culture, blood (Routine X 2) w Reflex to ID Panel     Status: None (Preliminary result)   Collection Time: 01/30/22 12:17 PM   Specimen: BLOOD  Result Value Ref Range Status   Specimen Description   Final    BLOOD LEFT ANTECUBITAL Performed at [facility] [address], [place], [place] [address]    Special Requests   Final    BOTTLES DRAWN AEROBIC ONLY Blood Culture adequate volume Performed at [facility] [address] [place], [place] [address]    Culture   Final    NO GROWTH 3 DAYS Performed at [facility], [place] [address] [place], [place] [address]    Report Status PENDING  Incomplete  Respiratory (~20 pathogens) panel by PCR     Status: Abnormal   Collection Time: 01/30/22 12:20 PM   Specimen: Nasopharyngeal Swab; Respiratory  Result Value Ref Range Status   Adenovirus NOT DETECTED NOT DETECTED Final   Coronavirus 229E NOT DETECTED NOT DETECTED Final    Comment: (NOTE) The Coronavirus on the Respiratory Panel, DOES NOT test for the novel  Coronavirus (2019 nCoV)    Coronavirus HKU1 NOT DETECTED NOT DETECTED Final   Coronavirus NL63 NOT DETECTED NOT DETECTED Final   Coronavirus OC43 NOT DETECTED NOT DETECTED Final   Metapneumovirus NOT DETECTED NOT DETECTED Final   Rhinovirus / Enterovirus NOT DETECTED NOT DETECTED Final   Influenza A NOT DETECTED NOT DETECTED Final   Influenza B NOT DETECTED NOT DETECTED Final   Parainfluenza Virus 1 NOT DETECTED NOT DETECTED Final   Parainfluenza Virus 2 NOT DETECTED NOT DETECTED Final   Parainfluenza Virus 3 NOT DETECTED NOT DETECTED Final   Parainfluenza Virus 4 DETECTED (A) NOT DETECTED Final   Respiratory Syncytial Virus NOT DETECTED NOT DETECTED Final   Bordetella pertussis NOT DETECTED NOT DETECTED Final   Bordetella Parapertussis NOT DETECTED NOT DETECTED Final   Chlamydophila pneumoniae NOT DETECTED NOT  DETECTED Final   Mycoplasma pneumoniae NOT DETECTED NOT DETECTED Final    Comment: Performed at [facility], [place]. [address], [place], [place] [address]  MRSA Next Gen by PCR, Nasal     Status: None   Collection Time: 01/30/22 12:20 PM   Specimen: Nasopharyngeal Swab; Nasal Swab  Result Value Ref Range Status   MRSA by PCR Next Gen NOT DETECTED NOT DETECTED Final    Comment: (NOTE) The GeneXpert MRSA Assay (FDA approved for NASAL specimens only), is one component of a comprehensive MRSA colonization surveillance program. It is not intended to diagnose MRSA infection nor to guide or monitor treatment for MRSA infections. Test performance is not FDA approved  in patients less than 18 years old. Performed at [facility], [place] [address] [place], [place] [address]          Radiology Studies: VAS US CAROTID  Result Date: 02/02/2022 Carotid Arterial Duplex Study Patient Name:  Raven Torres [facility]  Date of Exam:   02/02/2022 Medical Rec #: [id number]         Accession #:    [id number] Date of Birth: 09-May-1956         Patient Gender: F Patient Age:   64 years Exam Location:  [facility] Procedure:      VAS US CAROTID Referring Phys: Beulah Gandy --------------------------------------------------------------------------------  Indications:       CVA. Risk Factors:      Hypertension, Diabetes. Limitations        Today's exam was limited due to the body habitus of the                    patient, heavy calcification and the resulting shadowing, the                    patient's respiratory variation and patient positioning,                    patient movementm. Comparison Study:  No prior studies. Performing Technologist: Oliver Hum RVT  Examination Guidelines: A complete evaluation includes B-mode imaging, spectral Doppler, color Doppler, and power Doppler as needed of all accessible portions of each vessel. Bilateral testing is considered an integral part of a complete  examination. Limited examinations for reoccurring indications may be performed as noted.  Right Carotid Findings: +----------+-------+-------+--------+---------------------------------+--------+           PSV    EDV    StenosisPlaque Description               Comments           cm/s   cm/s                                                     +----------+-------+-------+--------+---------------------------------+--------+ CCA Prox  47     8              smooth, heterogenous and calcific         +----------+-------+-------+--------+---------------------------------+--------+ CCA Distal47     12             smooth and heterogenous                   +----------+-------+-------+--------+---------------------------------+--------+ ICA Prox  82     23             irregular, calcific and                                                   heterogenous                              +----------+-------+-------+--------+---------------------------------+--------+ ICA Distal62     17                                                       +----------+-------+-------+--------+---------------------------------+--------+  ECA       60     12                                                       +----------+-------+-------+--------+---------------------------------+--------+ +----------+--------+-------+--------+-------------------+           PSV cm/sEDV cmsDescribeArm Pressure (mmHG) +----------+--------+-------+--------+-------------------+ [id number]                                        +----------+--------+-------+--------+-------------------+ +---------+--------+--+--------+--+---------+ VertebralPSV cm/s65EDV cm/s14Antegrade +---------+--------+--+--------+--+---------+  Left Carotid Findings: +----------+-------+-------+--------+---------------------------------+--------+           PSV    EDV    StenosisPlaque Description               Comments            cm/s   cm/s                                                     +----------+-------+-------+--------+---------------------------------+--------+ CCA Prox  53     12             smooth and calcific                       +----------+-------+-------+--------+---------------------------------+--------+ CCA Distal51     14             smooth and heterogenous                   +----------+-------+-------+--------+---------------------------------+--------+ ICA Prox  147    21             irregular, heterogenous and                                               calcific                                  +----------+-------+-------+--------+---------------------------------+--------+ ICA Distal77     17                                                       +----------+-------+-------+--------+---------------------------------+--------+ ECA       65     15                                                       +----------+-------+-------+--------+---------------------------------+--------+ +----------+--------+--------+--------+-------------------+           PSV cm/sEDV cm/sDescribeArm Pressure (mmHG) +----------+--------+--------+--------+-------------------+ [id number]                                         +----------+--------+--------+--------+-------------------+ +---------+--------+--+--------+-+---------+  VertebralPSV cm/s27EDV cm/s7Antegrade +---------+--------+--+--------+-+---------+   Summary: Right Carotid: Velocities in the right ICA are consistent with a 1-39% stenosis. Left Carotid: Velocities in the left ICA are consistent with a 1-39% stenosis. Vertebrals: Bilateral vertebral arteries demonstrate antegrade flow. *See table(s) above for measurements and observations.     Preliminary    ECHOCARDIOGRAM COMPLETE  Result Date: 02/01/2022    ECHOCARDIOGRAM REPORT   Patient Name:   Raven Torres [facility] Date of Exam: 02/01/2022 Medical Rec  #:  [id number]        Height:       64.0 in Accession #:    [id number]       Weight:       84.7 lb Date of Birth:  January 17, 1956        BSA:          1.358 m Patient Age:    29 years         BP:           121/76 mmHg Patient Gender: F                HR:           73 bpm. Exam Location:  Inpatient Procedure: 2D Echo, Cardiac Doppler and Color Doppler Indications:    Stroke  History:        Patient has no prior history of Echocardiogram examinations.                 COPD; Risk Factors:Former Smoker, Hypertension and HLD.  Sonographer:    Joette Catching RCS Referring Phys: [id number] [facility]  Sonographer Comments: Image acquisition challenging due to patient body habitus. IMPRESSIONS  1. Left ventricular ejection fraction, by estimation, is 60 to 65%. The left ventricle has normal function. The left ventricle has no regional wall motion abnormalities. Left ventricular diastolic parameters are consistent with Grade II diastolic dysfunction (pseudonormalization). Elevated left ventricular end-diastolic pressure.  2. Right ventricular systolic function is normal. The right ventricular size is normal.  3. The mitral valve is abnormal. Trivial mitral valve regurgitation. No evidence of mitral stenosis. Moderate mitral annular calcification.  4. The aortic valve is tricuspid. There is mild calcification of the aortic valve. Aortic valve regurgitation is not visualized. Aortic valve sclerosis is present, with no evidence of aortic valve stenosis.  5. The inferior vena cava is normal in size with <50% respiratory variability, suggesting right atrial pressure of 8 mmHg. FINDINGS  Left Ventricle: Left ventricular ejection fraction, by estimation, is 60 to 65%. The left ventricle has normal function. The left ventricle has no regional wall motion abnormalities. The left ventricular internal cavity size was normal in size. There is  no left ventricular hypertrophy. Left ventricular diastolic parameters are consistent with Grade II  diastolic dysfunction (pseudonormalization). Elevated left ventricular end-diastolic pressure. Right Ventricle: The right ventricular size is normal. No increase in right ventricular wall thickness. Right ventricular systolic function is normal. Left Atrium: Left atrial size was normal in size. Right Atrium: Right atrial size was normal in size. Pericardium: Trivial pericardial effusion is present. The pericardial effusion is anterior to the right ventricle. Mitral Valve: The mitral valve is abnormal. There is moderate thickening of the mitral valve leaflet(s). There is moderate calcification of the mitral valve leaflet(s). Moderate mitral annular calcification. Trivial mitral valve regurgitation. No evidence of mitral valve stenosis. Tricuspid Valve: The tricuspid valve is normal in structure. Tricuspid valve regurgitation is not demonstrated. No evidence of tricuspid stenosis. Aortic Valve: The aortic  valve is tricuspid. There is mild calcification of the aortic valve. Aortic valve regurgitation is not visualized. Aortic valve sclerosis is present, with no evidence of aortic valve stenosis. Aortic valve mean gradient measures 4.0 mmHg. Aortic valve peak gradient measures 7.2 mmHg. Aortic valve area, by VTI measures 1.73 cm. Pulmonic Valve: The pulmonic valve was normal in structure. Pulmonic valve regurgitation is not visualized. No evidence of pulmonic stenosis. Aorta: The aortic root is normal in size and structure. Venous: The inferior vena cava is normal in size with less than 50% respiratory variability, suggesting right atrial pressure of 8 mmHg. IAS/Shunts: No atrial level shunt detected by color flow Doppler.  LEFT VENTRICLE PLAX 2D LVIDd:         4.00 cm   Diastology LVIDs:         2.50 cm   LV e' medial:    4.46 cm/s LV PW:         0.80 cm   LV E/e' medial:  23.3 LV IVS:        0.90 cm   LV e' lateral:   5.22 cm/s LVOT diam:     1.70 cm   LV E/e' lateral: 19.9 LV SV:         45 LV SV Index:   33 LVOT  Area:     2.27 cm  RIGHT VENTRICLE          IVC RV Basal diam:  2.10 cm  IVC diam: 1.90 cm RV Mid diam:    1.60 cm TAPSE (M-mode): 1.4 cm LEFT ATRIUM             Index        RIGHT ATRIUM          Index LA diam:        2.20 cm 1.62 cm/m   RA Area:     5.96 cm LA Vol (A2C):   19.2 ml 14.14 ml/m  RA Volume:   9.07 ml  6.68 ml/m LA Vol (A4C):   18.7 ml 13.77 ml/m LA Biplane Vol: 19.9 ml 14.66 ml/m  AORTIC VALVE                    PULMONIC VALVE AV Area (Vmax):    1.80 cm     PV Vmax:       1.11 m/s AV Area (Vmean):   1.77 cm     PV Peak grad:  4.9 mmHg AV Area (VTI):     1.73 cm AV Vmax:           134.00 cm/s AV Vmean:          89.600 cm/s AV VTI:            0.260 m AV Peak Grad:      7.2 mmHg AV Mean Grad:      4.0 mmHg LVOT Vmax:         106.00 cm/s LVOT Vmean:        69.750 cm/s LVOT VTI:          0.198 m LVOT/AV VTI ratio: 0.76  AORTA Ao Root diam: 2.60 cm MITRAL VALVE                TRICUSPID VALVE MV Area (PHT): 3.46 cm     TR Peak grad:   21.0 mmHg MV Decel Time: 219 msec     TR Vmax:        229.00 cm/s MV E velocity: 104.00  cm/s MV A velocity: 99.00 cm/s   SHUNTS MV E/A ratio:  1.05         Systemic VTI:  0.20 m                             Systemic Diam: 1.70 cm Jenkins Rouge MD Electronically signed by Jenkins Rouge MD Signature Date/Time: 02/01/2022/12:24:43 PM    Final    CT ANGIO HEAD NECK W WO CM  Result Date: 01/31/2022 CLINICAL DATA:  Follow-up examination for acute stroke. EXAM: CT ANGIOGRAPHY HEAD AND NECK TECHNIQUE: Multidetector CT imaging of the head and neck was performed using the standard protocol during bolus administration of intravenous contrast. Multiplanar CT image reconstructions and MIPs were obtained to evaluate the vascular anatomy. Carotid stenosis measurements (when applicable) are obtained utilizing NASCET criteria, using the distal internal carotid diameter as the denominator. RADIATION DOSE REDUCTION: This exam was performed according to the departmental  dose-optimization program which includes automated exposure control, adjustment of the mA and/or kV according to patient size and/or use of iterative reconstruction technique. CONTRAST:  73m OMNIPAQUE IOHEXOL 350 MG/ML SOLN COMPARISON:  MRI from earlier the same day. FINDINGS: CT HEAD FINDINGS Brain: Age-related cerebral atrophy with chronic microvascular ischemic disease. Previously identified acute ischemic infarcts involving the posterior right frontoparietal region and occipital lobes, grossly stable. No associated hemorrhage or mass effect. No other acute large vessel territory infarct or hemorrhage. No midline shift. No hydrocephalus or extra-axial fluid collection. Vascular: No hyperdense vessel. Calcified atherosclerosis present at the skull base. Skull: Scalp soft tissues and calvarium within normal limits. Sinuses: Air-fluid level noted within the left maxillary sinus. Mastoid air cells are clear. Orbits: Globes orbital soft tissues demonstrate no acute finding. Review of the MIP images confirms the above findings CTA NECK FINDINGS Aortic arch: Visualized aortic arch normal in caliber with standard branching pattern. Moderate to advanced atheromatous change about the arch itself. No high-grade stenosis about the origin the great vessels. Right carotid system: Right CCA patent without stenosis. Scattered calcified plaque about the right carotid bulb/proximal right ICA without significant stenosis. Occlusion versus severe stenosis at the origin of the right external carotid artery noted. Distally, there is a small focal filling defect along the posterior wall of the proximal cervical right ICA (series 11, image 160), suspicious for thrombus. Finding favored to reflect changes due to a recently ruptured plaque, although a possible small focal dissection could also be considered. No significant stenosis. Right ICA patent distally without stenosis or other abnormality. Left carotid system: Left CCA patent  without stenosis. Bulky calcified plaque about the left carotid bulb/proximal left ICA without hemodynamically significant greater than 50% stenosis. Severe stenosis versus occlusion noted at the left external carotid artery. Cervical left ICA patent distally without stenosis or other acute finding. Vertebral arteries: Both vertebral arteries arise from subclavian arteries. No significant proximal subclavian artery stenosis. Right vertebral artery dominant. Vertebral arteries patent without stenosis or dissection. Skeleton: No discrete or worrisome osseous lesions. Patient is edentulous. Other neck: No other acute soft tissue abnormality within the neck. Upper chest: Hyperinflation with severe emphysema. Visualized upper chest demonstrates no other acute finding. Shotty subcentimeter mediastinal lymph nodes noted, which could be reactive. Review of the MIP images confirms the above findings CTA HEAD FINDINGS Anterior circulation: Petrous segments patent bilaterally. Atheromatous change seen throughout the carotid siphons with associated moderate multifocal narrowing. A1 segments, anterior communicating complex common anterior cerebral arteries widely patent. No  M1 stenosis or occlusion. No proximal MCA branch occlusion. Distal MCA branches perfused and symmetric. Posterior circulation: Right vertebral artery dominant and patent to the vertebrobasilar junction without stenosis. Left vertebral artery hypoplastic. Superimposed atherosclerotic calcification about the proximal left V4 segment with associated moderate multifocal stenoses. Right PICA patent. Left PICA not well seen. Tiny fenestration noted at the vertebrobasilar junction. Basilar patent distally without stenosis. Superior cerebellar arteries patent bilaterally. Both PCAs primarily supplied via the basilar well perfused or distal aspects. Venous sinuses: Patent allowing for timing the contrast bolus. Anatomic variants: None significant.  No aneurysm. Review  of the MIP images confirms the above findings IMPRESSION: CT HEAD IMPRESSION: 1. 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 No associated hemorrhage or mass effect. 2. No other new acute intracranial abnormality. 3. Underlying atrophy with chronic small vessel ischemic disease. CTA HEAD AND NECK IMPRESSION: 1. Small focal filling defect along the posterior wall of the proximal cervical right ICA, suspicious for thrombus. Finding favored to reflect changes due to a recently ruptured plaque, although a possible small focal dissection could also be considered. No significant stenosis. 2. Moderate atheromatous change about the skull base with associated moderate multifocal narrowing about the siphons and about the proximal left V4 segment. No other hemodynamically significant or correctable stenosis. 3. Emphysema (ICD10-J43.9). Electronically Signed   By: Jeannine Boga M.D.   On: 01/31/2022 22:32   MR BRAIN WO CONTRAST  Result Date: 01/31/2022 CLINICAL DATA:  Acute neuro deficit. EXAM: MRI HEAD WITHOUT CONTRAST TECHNIQUE: Multiplanar, multiecho pulse sequences of the brain and surrounding structures were obtained without intravenous contrast. COMPARISON:  None Available. FINDINGS: Brain: Multiple areas of acute infarct are present. Small acute infarcts in the occipital lobe bilaterally. Patchy areas of acute infarct in the right parietal lobe. No associated hemorrhage. Ventricle size normal. Moderate chronic microvascular ischemic change in the white matter. No mass lesion. Vascular: Normal arterial flow voids in the skull base. Skull and upper cervical spine: No focal abnormality. Sinuses/Orbits: Mucosal edema paranasal sinuses. Left maxillary air-fluid level. Negative orbit Other: None IMPRESSION: Small acute infarcts in the occipital lobe bilaterally. Cluster of acute infarcts in the right parietal lobe. Findings suggest emboli given different  vascular distributions. Moderate chronic microvascular ischemic change in the white matter. No acute hemorrhage Electronically Signed   By: Franchot Gallo M.D.   On: 01/31/2022 16:59        Scheduled Meds:  [START ON 02/03/2022]  stroke: early stages of recovery book   Does not apply Once   albuterol  2.5 mg Nebulization BID   arformoterol  15 mcg Nebulization BID   And   umeclidinium bromide  1 puff Inhalation Daily   [START ON 02/03/2022] aspirin  81 mg Oral Daily   enoxaparin (LOVENOX) injection  40 mg Subcutaneous Q24H   feeding supplement  237 mL Oral 5 X Daily   predniSONE  40 mg Oral Q breakfast   Continuous Infusions:  azithromycin 500 mg (02/02/22 1117)   cefTRIAXone (ROCEPHIN)  IV 200 mL/hr at 02/01/22 1300          Raven Monterrosa, MD [facility] 02/02/2022, 1:50 PM

## 2022-02-02 NOTE — Progress Notes (Signed)
    Cardiology asked to perform TEE in the setting of CVA. 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 Speech evaluation noted. She would be high risk for esophageal perforation therefore not a good candidate for TEE. Admitting team updated regarding recommendations.   SignedReino Bellis, NP-C 02/02/2022, 11:44 AM

## 2022-02-03 DIAGNOSIS — Z720 Tobacco use: Secondary | ICD-10-CM

## 2022-02-03 DIAGNOSIS — J9601 Acute respiratory failure with hypoxia: Secondary | ICD-10-CM | POA: Diagnosis not present

## 2022-02-03 DIAGNOSIS — E43 Unspecified severe protein-calorie malnutrition: Secondary | ICD-10-CM

## 2022-02-03 DIAGNOSIS — C321 Malignant neoplasm of supraglottis: Secondary | ICD-10-CM | POA: Diagnosis not present

## 2022-02-03 DIAGNOSIS — J189 Pneumonia, unspecified organism: Secondary | ICD-10-CM | POA: Diagnosis not present

## 2022-02-03 NOTE — Progress Notes (Signed)
PROGRESS NOTE    Raven Torres  WCB:[id number] DOB: 07-21-1956 DOA: 01/30/2022 PCP: Ladell Pier, MD   Brief Narrative:  66 year old with history of supraglottic cancer status post radiation, smoker, COPD, hypertension, hyperlipidemia presented to the ER with about 6 days of worsening shortness of breath, coughing up more sputum and also subjective fever at home.  Felt very weak.  She does have chronic aspiration but currently on regular diet.  Used to have PEG tube but none now. In the emergency room, reportedly she was brought on nonrebreather.  WBC count normal.  Chest x-ray showed advanced COPD changes along with left middle lobe infiltrate.  Admitted with aspiration pneumonia and treated with antibiotics and oxygen.  Does not use oxygen at home.   On 01/31/2022, patient was found to have weakness of the left wrist which had started more than 12 hours ago.  She was found to have thromboembolic stroke as below.  Case was discussed with neurology.  Assessment & Plan:   Acute right parietal ischemic infarct -Likely embolic secondary to hypercoagulability -Patient was found to have weakness of the left wrist on 01/31/2022 which had started more than 12 hours ago.  Case was discussed with neurology.  Patient was out of the window for any intervention.  Neurology recommended to complete stroke work-up at [facility] and recommended against transfer.  She was found to have infarct as above.  Currently on aspirin 81 mg daily. -CTA head/neck: Extensive atheromatous changes multifocal narrowing, suspected small filling defect along the posterior wall of the proximal cervical right ICA suspicious for thrombus. --LDL 91.  Start Lipitor.  Recent A1c 4.8. -Echo showed EF of 60 to 65% with grade 2 diastolic dysfunction.  Patient is not a good candidate for TEE as per cardiology input and hence TEE has been canceled -Pulmonary vascular team evaluation appreciated: Recommend aspirin for now  and start full dose Lovenox or Eliquis from February 06, 2022 onwards after discussion with oncology.  Continue Lipitor.  Patient follow-up with neurology -PT/OT following.  Recommend SNF.  TOC consult.  Acute respiratory failure with hypoxia Left sided community-acquired pneumonia COPD exacerbation -Currently intermittently requiring 2 L oxygen by nasal cannula.  Complete 5-day course of Rocephin and Zithromax. -Solu-Medrol has been switched to oral steroids.  Continue current nebs and inhalers  Subglottic malignancy status postradiation Chronic dysphagia and aspiration Cancer related cachexia/severe protein calorie malnutrition Generalized conditioning/debility -Diet as per SLP recommendations.  Overall prognosis is guarded to poor.  Nutrition following. -Palliative care following.  Patient will need outpatient palliative care follow-up.  Patient interested in SNF placement.  TOC following. -Throat cancer treated with chemoradiation: Currently not on active treatment  Hypernatremia -Resolved  Leukocytosis -Mild.  Monitor intermittently  Right/left/medial coccygeal stage I pressure injury: Present on admission -Agree with documentation as below.  Continue local wound care. Pressure Injury 01/30/22 Coccyx Right;Left;Medial Stage 1 -  Intact skin with non-blanchable redness of a localized area usually over a bony prominence. Red, pink (Active)  01/30/22 1340  Location: Coccyx  Location Orientation: Right;Left;Medial  Staging: Stage 1 -  Intact skin with non-blanchable redness of a localized area usually over a bony prominence.  Wound Description (Comments): Red, pink  Present on Admission: Yes   DVT prophylaxis: Lovenox Code Status: DNR Family Communication: Daughter at bedside and sister on phone Disposition Plan: Status is: Inpatient Remains inpatient appropriate because: Of severity of illness.  Need for SNF placement  Consultants: Palliative care/neurology  Procedures:  Echo  Antimicrobials: Rocephin and Zithromax from 01/22/2022 onwards   Subjective: Patient seen and examined at bedside.  Poor historian.  Still complains of dry cough and inability to bring up phlegm.  Shortness of breath with exertion.  Oral intake is improving but still poor.  No overnight fever, vomiting, agitation reported. Objective: Vitals:   02/02/22 2151 02/03/22 0102 02/03/22 0618 02/03/22 0817  BP: (!) 98/51  108/60   Pulse: 69  85   Resp: 16  16   Temp: (!) 97.5 F (36.4 C)  (!) 97.5 F (36.4 C)   TempSrc: Oral  Oral   SpO2: 96% 92% (!) 81% 92%  Weight:      Height:        Intake/Output Summary (Last 24 hours) at 02/03/2022 0837 Last data filed at 02/03/2022 0618 Gross per 24 hour  Intake 837 ml  Output 2 ml  Net 835 ml    Filed Weights   01/30/22 1326 01/30/22 1511  Weight: 38.4 kg 38.4 kg    Examination:  General: On room air.  No distress.  Looks cachectic; extremely deconditioned. ENT/neck: No thyromegaly.  JVD is not elevated  respiratory: Decreased breath sounds at bases bilaterally with some crackles; no wheezing  CVS: S1-S2 heard, rate controlled currently Abdominal: Soft, nontender, slightly distended; no organomegaly, bowel sounds are heard Extremities: Trace lower extremity edema; no cyanosis  CNS: Awake and alert.  No focal neurologic deficit.  Left upper extremity weakness present.   Lymph: No obvious lymphadenopathy Skin: No obvious ecchymosis/lesions  psych: Not agitated currently.  Flat affect.   Musculoskeletal: No obvious joint swelling/deformity   Data Reviewed: I have personally reviewed following labs and imaging studies  CBC: Recent Labs  Lab 01/30/22 0937 01/31/22 0457 02/02/22 0640  WBC 10.8* 12.7* 11.5*  NEUTROABS  --   --  10.1*  HGB 14.1 14.2 13.1  HCT 45.7 45.3 42.0  MCV 97.9 97.0 97.2  PLT 176 174 606    Basic Metabolic Panel: Recent Labs  Lab 01/30/22 0937 01/31/22 0457 02/02/22 0640  NA 147* 146* 141  K 3.5  4.0 4.8  CL 115* 103 103  CO2 26 34* 36*  GLUCOSE 112* 143* 82  BUN 18 25* 30*  CREATININE 0.46 0.66 0.52  CALCIUM 6.7* 9.6 9.1  MG  --   --  2.4  PHOS  --   --  2.9    GFR: Estimated Creatinine Clearance: 40.8 mL/min (by C-G formula based on SCr of 0.52 mg/dL). Liver Function Tests: Recent Labs  Lab 01/30/22 0937 01/31/22 0457  AST 28 29  ALT 12 24  ALKPHOS 39 58  BILITOT 1.0 0.7  PROT 5.2* 7.7  ALBUMIN 2.2* 3.2*    No results for input(s): "LIPASE", "AMYLASE" in the last 168 hours. No results for input(s): "AMMONIA" in the last 168 hours. Coagulation Profile: No results for input(s): "INR", "PROTIME" in the last 168 hours. Cardiac Enzymes: No results for input(s): "CKTOTAL", "CKMB", "CKMBINDEX", "TROPONINI" in the last 168 hours. BNP (last 3 results) No results for input(s): "PROBNP" in the last 8760 hours. HbA1C: Recent Labs    02/02/22 0859  HGBA1C 5.0   CBG: No results for input(s): "GLUCAP" in the last 168 hours. Lipid Profile: Recent Labs    02/01/22 0510  CHOL 144  HDL 40*  LDLCALC 91  TRIG 65  CHOLHDL 3.6    Thyroid Function Tests: No results for input(s): "TSH", "T4TOTAL", "FREET4", "T3FREE", "THYROIDAB" in the last 72 hours. Anemia Panel: No  results for input(s): "VITAMINB12", "FOLATE", "FERRITIN", "TIBC", "IRON", "RETICCTPCT" in the last 72 hours. Sepsis Labs: Recent Labs  Lab 01/30/22 0937 01/31/22 0457 02/01/22 0510  PROCALCITON <0.10 0.13 <0.10     Recent Results (from the past 240 hour(s))  Culture, blood (Routine X 2) w Reflex to ID Panel     Status: None (Preliminary result)   Collection Time: 01/30/22 12:15 PM   Specimen: BLOOD  Result Value Ref Range Status   Specimen Description   Final    BLOOD BLOOD LEFT FOREARM Performed at [facility], [place] [address], [place], [place] [address]    Special Requests   Final    BOTTLES DRAWN AEROBIC AND ANAEROBIC Blood Culture results may not be optimal due to an  inadequate volume of blood received in culture bottles Performed at [facility] [address], [place], [place] [address]    Culture   Final    NO GROWTH 4 DAYS Performed at [facility], [place] [address] [place], [place] [address]    Report Status PENDING  Incomplete  Culture, blood (Routine X 2) w Reflex to ID Panel     Status: None (Preliminary result)   Collection Time: 01/30/22 12:17 PM   Specimen: BLOOD  Result Value Ref Range Status   Specimen Description   Final    BLOOD LEFT ANTECUBITAL Performed at [facility] [address] [place], [place] [address]    Special Requests   Final    BOTTLES DRAWN AEROBIC ONLY Blood Culture adequate volume Performed at [facility] [address], [place], [place] [address]    Culture   Final    NO GROWTH 4 DAYS Performed at [facility], [place] [address], [place], [place] [address]    Report Status PENDING  Incomplete  Respiratory (~20 pathogens) panel by PCR     Status: Abnormal   Collection Time: 01/30/22 12:20 PM   Specimen: Nasopharyngeal Swab; Respiratory  Result Value Ref Range Status   Adenovirus NOT DETECTED NOT DETECTED Final   Coronavirus 229E NOT DETECTED NOT DETECTED Final    Comment: (NOTE) The Coronavirus on the Respiratory Panel, DOES NOT test for the novel  Coronavirus (2019 nCoV)    Coronavirus HKU1 NOT DETECTED NOT DETECTED Final   Coronavirus NL63 NOT DETECTED NOT DETECTED Final   Coronavirus OC43 NOT DETECTED NOT DETECTED Final   Metapneumovirus NOT DETECTED NOT DETECTED Final   Rhinovirus / Enterovirus NOT DETECTED NOT DETECTED Final   Influenza A NOT DETECTED NOT DETECTED Final   Influenza B NOT DETECTED NOT DETECTED Final   Parainfluenza Virus 1 NOT DETECTED NOT DETECTED Final   Parainfluenza Virus 2 NOT DETECTED NOT DETECTED Final   Parainfluenza Virus 3 NOT DETECTED NOT DETECTED Final   Parainfluenza Virus 4 DETECTED (A) NOT DETECTED  Final   Respiratory Syncytial Virus NOT DETECTED NOT DETECTED Final   Bordetella pertussis NOT DETECTED NOT DETECTED Final   Bordetella Parapertussis NOT DETECTED NOT DETECTED Final   Chlamydophila pneumoniae NOT DETECTED NOT DETECTED Final   Mycoplasma pneumoniae NOT DETECTED NOT DETECTED Final    Comment: Performed at [facility], [place]. [address] [place], [place] [address]  MRSA Next Gen by PCR, Nasal     Status: None   Collection Time: 01/30/22 12:20 PM   Specimen: Nasopharyngeal Swab; Nasal Swab  Result Value Ref Range Status   MRSA by PCR Next Gen NOT DETECTED NOT DETECTED Final  Comment: (NOTE) The GeneXpert MRSA Assay (FDA approved for NASAL specimens only), is one component of a comprehensive MRSA colonization surveillance program. It is not intended to diagnose MRSA infection nor to guide or monitor treatment for MRSA infections. Test performance is not FDA approved in patients less than 45 years old. Performed at [facility], [place] [address] [place], [place] [address]          Radiology Studies: VAS US CAROTID  Result Date: 02/02/2022 Carotid Arterial Duplex Study Patient Name:  GRESIA ISIDORO [facility]  Date of Exam:   02/02/2022 Medical Rec #: [id number]         Accession #:    [id number] Date of Birth: 03/12/1956         Patient Gender: F Patient Age:   50 years Exam Location:  [facility] Procedure:      VAS US CAROTID Referring Phys: Beulah Gandy --------------------------------------------------------------------------------  Indications:       CVA. Risk Factors:      Hypertension, Diabetes. Limitations        Today's exam was limited due to the body habitus of the                    patient, heavy calcification and the resulting shadowing, the                    patient's respiratory variation and patient positioning,                    patient movementm. Comparison Study:  No prior studies. Performing Technologist: Oliver Hum RVT   Examination Guidelines: A complete evaluation includes B-mode imaging, spectral Doppler, color Doppler, and power Doppler as needed of all accessible portions of each vessel. Bilateral testing is considered an integral part of a complete examination. Limited examinations for reoccurring indications may be performed as noted.  Right Carotid Findings: +----------+-------+-------+--------+---------------------------------+--------+           PSV    EDV    StenosisPlaque Description               Comments           cm/s   cm/s                                                     +----------+-------+-------+--------+---------------------------------+--------+ CCA Prox  47     8              smooth, heterogenous and calcific         +----------+-------+-------+--------+---------------------------------+--------+ CCA Distal47     12             smooth and heterogenous                   +----------+-------+-------+--------+---------------------------------+--------+ ICA Prox  82     23             irregular, calcific and                                                   heterogenous                              +----------+-------+-------+--------+---------------------------------+--------+  ICA Distal62     17                                                       +----------+-------+-------+--------+---------------------------------+--------+ ECA       60     12                                                       +----------+-------+-------+--------+---------------------------------+--------+ +----------+--------+-------+--------+-------------------+           PSV cm/sEDV cmsDescribeArm Pressure (mmHG) +----------+--------+-------+--------+-------------------+ [id number]                                        +----------+--------+-------+--------+-------------------+ +---------+--------+--+--------+--+---------+ VertebralPSV cm/s65EDV  cm/s14Antegrade +---------+--------+--+--------+--+---------+  Left Carotid Findings: +----------+-------+-------+--------+---------------------------------+--------+           PSV    EDV    StenosisPlaque Description               Comments           cm/s   cm/s                                                     +----------+-------+-------+--------+---------------------------------+--------+ CCA Prox  53     12             smooth and calcific                       +----------+-------+-------+--------+---------------------------------+--------+ CCA Distal51     14             smooth and heterogenous                   +----------+-------+-------+--------+---------------------------------+--------+ ICA Prox  147    21             irregular, heterogenous and                                               calcific                                  +----------+-------+-------+--------+---------------------------------+--------+ ICA Distal77     17                                                       +----------+-------+-------+--------+---------------------------------+--------+ ECA       65     15                                                       +----------+-------+-------+--------+---------------------------------+--------+ +----------+--------+--------+--------+-------------------+  PSV cm/sEDV cm/sDescribeArm Pressure (mmHG) +----------+--------+--------+--------+-------------------+ [id number]                                         +----------+--------+--------+--------+-------------------+ +---------+--------+--+--------+-+---------+ VertebralPSV cm/s27EDV cm/s7Antegrade +---------+--------+--+--------+-+---------+   Summary: Right Carotid: Velocities in the right ICA are consistent with a 1-39% stenosis. Left Carotid: Velocities in the left ICA are consistent with a 1-39% stenosis. Vertebrals: Bilateral vertebral  arteries demonstrate antegrade flow. *See table(s) above for measurements and observations.     Preliminary    ECHOCARDIOGRAM COMPLETE  Result Date: 02/01/2022    ECHOCARDIOGRAM REPORT   Patient Name:   LYNELLE WEILER [facility] Date of Exam: 02/01/2022 Medical Rec #:  [id number]        Height:       64.0 in Accession #:    [id number]       Weight:       84.7 lb Date of Birth:  1956/03/19        BSA:          1.358 m Patient Age:    73 years         BP:           121/76 mmHg Patient Gender: F                HR:           73 bpm. Exam Location:  Inpatient Procedure: 2D Echo, Cardiac Doppler and Color Doppler Indications:    Stroke  History:        Patient has no prior history of Echocardiogram examinations.                 COPD; Risk Factors:Former Smoker, Hypertension and HLD.  Sonographer:    Joette Catching RCS Referring Phys: [id number] [facility]  Sonographer Comments: Image acquisition challenging due to patient body habitus. IMPRESSIONS  1. Left ventricular ejection fraction, by estimation, is 60 to 65%. The left ventricle has normal function. The left ventricle has no regional wall motion abnormalities. Left ventricular diastolic parameters are consistent with Grade II diastolic dysfunction (pseudonormalization). Elevated left ventricular end-diastolic pressure.  2. Right ventricular systolic function is normal. The right ventricular size is normal.  3. The mitral valve is abnormal. Trivial mitral valve regurgitation. No evidence of mitral stenosis. Moderate mitral annular calcification.  4. The aortic valve is tricuspid. There is mild calcification of the aortic valve. Aortic valve regurgitation is not visualized. Aortic valve sclerosis is present, with no evidence of aortic valve stenosis.  5. The inferior vena cava is normal in size with <50% respiratory variability, suggesting right atrial pressure of 8 mmHg. FINDINGS  Left Ventricle: Left ventricular ejection fraction, by estimation, is 60 to 65%. The left  ventricle has normal function. The left ventricle has no regional wall motion abnormalities. The left ventricular internal cavity size was normal in size. There is  no left ventricular hypertrophy. Left ventricular diastolic parameters are consistent with Grade II diastolic dysfunction (pseudonormalization). Elevated left ventricular end-diastolic pressure. Right Ventricle: The right ventricular size is normal. No increase in right ventricular wall thickness. Right ventricular systolic function is normal. Left Atrium: Left atrial size was normal in size. Right Atrium: Right atrial size was normal in size. Pericardium: Trivial pericardial effusion is present. The pericardial effusion is anterior to the right ventricle. Mitral Valve: The mitral valve is abnormal. There is moderate thickening of the mitral  valve leaflet(s). There is moderate calcification of the mitral valve leaflet(s). Moderate mitral annular calcification. Trivial mitral valve regurgitation. No evidence of mitral valve stenosis. Tricuspid Valve: The tricuspid valve is normal in structure. Tricuspid valve regurgitation is not demonstrated. No evidence of tricuspid stenosis. Aortic Valve: The aortic valve is tricuspid. There is mild calcification of the aortic valve. Aortic valve regurgitation is not visualized. Aortic valve sclerosis is present, with no evidence of aortic valve stenosis. Aortic valve mean gradient measures 4.0 mmHg. Aortic valve peak gradient measures 7.2 mmHg. Aortic valve area, by VTI measures 1.73 cm. Pulmonic Valve: The pulmonic valve was normal in structure. Pulmonic valve regurgitation is not visualized. No evidence of pulmonic stenosis. Aorta: The aortic root is normal in size and structure. Venous: The inferior vena cava is normal in size with less than 50% respiratory variability, suggesting right atrial pressure of 8 mmHg. IAS/Shunts: No atrial level shunt detected by color flow Doppler.  LEFT VENTRICLE PLAX 2D LVIDd:          4.00 cm   Diastology LVIDs:         2.50 cm   LV e' medial:    4.46 cm/s LV PW:         0.80 cm   LV E/e' medial:  23.3 LV IVS:        0.90 cm   LV e' lateral:   5.22 cm/s LVOT diam:     1.70 cm   LV E/e' lateral: 19.9 LV SV:         45 LV SV Index:   33 LVOT Area:     2.27 cm  RIGHT VENTRICLE          IVC RV Basal diam:  2.10 cm  IVC diam: 1.90 cm RV Mid diam:    1.60 cm TAPSE (M-mode): 1.4 cm LEFT ATRIUM             Index        RIGHT ATRIUM          Index LA diam:        2.20 cm 1.62 cm/m   RA Area:     5.96 cm LA Vol (A2C):   19.2 ml 14.14 ml/m  RA Volume:   9.07 ml  6.68 ml/m LA Vol (A4C):   18.7 ml 13.77 ml/m LA Biplane Vol: 19.9 ml 14.66 ml/m  AORTIC VALVE                    PULMONIC VALVE AV Area (Vmax):    1.80 cm     PV Vmax:       1.11 m/s AV Area (Vmean):   1.77 cm     PV Peak grad:  4.9 mmHg AV Area (VTI):     1.73 cm AV Vmax:           134.00 cm/s AV Vmean:          89.600 cm/s AV VTI:            0.260 m AV Peak Grad:      7.2 mmHg AV Mean Grad:      4.0 mmHg LVOT Vmax:         106.00 cm/s LVOT Vmean:        69.750 cm/s LVOT VTI:          0.198 m LVOT/AV VTI ratio: 0.76  AORTA Ao Root diam: 2.60 cm MITRAL VALVE  TRICUSPID VALVE MV Area (PHT): 3.46 cm     TR Peak grad:   21.0 mmHg MV Decel Time: 219 msec     TR Vmax:        229.00 cm/s MV E velocity: 104.00 cm/s MV A velocity: 99.00 cm/s   SHUNTS MV E/A ratio:  1.05         Systemic VTI:  0.20 m                             Systemic Diam: 1.70 cm Jenkins Rouge MD Electronically signed by Jenkins Rouge MD Signature Date/Time: 02/01/2022/12:24:43 PM    Final         Scheduled Meds:   stroke: early stages of recovery book   Does not apply Once   arformoterol  15 mcg Nebulization BID   And   umeclidinium bromide  1 puff Inhalation Daily   aspirin  81 mg Oral Daily   atorvastatin  40 mg Oral QHS   budesonide (PULMICORT) nebulizer solution  0.5 mg Nebulization BID   enoxaparin (LOVENOX) injection  40 mg Subcutaneous Q24H    feeding supplement  237 mL Oral 5 X Daily   ipratropium-albuterol  3 mL Nebulization Q6H   predniSONE  40 mg Oral Q breakfast   Continuous Infusions:  azithromycin 500 mg (02/02/22 1117)   cefTRIAXone (ROCEPHIN)  IV 1 g (02/02/22 1629)          Aline August, MD [facility] 02/03/2022, 8:37 AM

## 2022-02-03 NOTE — Progress Notes (Signed)
Provider Rory Percy, MD came to the beside to discuss with patient and daughter the findings on scans. Daughter was asleep at time of visit. Provider left hand written noted at bedside for daughter. Please see provider note for updates on plan of care. Will continue to monitor.

## 2022-02-03 NOTE — Progress Notes (Signed)
Telemetry box paused due to nursing staff trying to trouble issues with box. Batteries and leads were changed out multiple times throughout the night. Will continue to monitor.

## 2022-02-04 ENCOUNTER — Encounter (HOSPITAL_COMMUNITY)
Admission: EM | Disposition: A | Discharge status: Skilled Nursing Facility | Payer: Self-pay | Source: Home / Self Care | Attending: Internal Medicine

## 2022-02-04 ENCOUNTER — Inpatient Hospital Stay (HOSPITAL_COMMUNITY): Payer: Medicare Other

## 2022-02-04 ENCOUNTER — Encounter (HOSPITAL_COMMUNITY): Payer: Self-pay | Admitting: Internal Medicine

## 2022-02-04 DIAGNOSIS — I639 Cerebral infarction, unspecified: Secondary | ICD-10-CM | POA: Diagnosis not present

## 2022-02-04 DIAGNOSIS — C321 Malignant neoplasm of supraglottis: Secondary | ICD-10-CM | POA: Diagnosis not present

## 2022-02-04 DIAGNOSIS — Z7189 Other specified counseling: Secondary | ICD-10-CM | POA: Diagnosis not present

## 2022-02-04 DIAGNOSIS — R627 Adult failure to thrive: Secondary | ICD-10-CM | POA: Diagnosis not present

## 2022-02-04 DIAGNOSIS — J189 Pneumonia, unspecified organism: Secondary | ICD-10-CM | POA: Diagnosis not present

## 2022-02-04 DIAGNOSIS — Z515 Encounter for palliative care: Secondary | ICD-10-CM | POA: Diagnosis not present

## 2022-02-04 DIAGNOSIS — L89152 Pressure ulcer of sacral region, stage 2: Secondary | ICD-10-CM | POA: Diagnosis not present

## 2022-02-04 DIAGNOSIS — J9601 Acute respiratory failure with hypoxia: Secondary | ICD-10-CM | POA: Diagnosis not present

## 2022-02-04 LAB — CULTURE, BLOOD (ROUTINE X 2)
Culture: NO GROWTH
Culture: NO GROWTH
Special Requests: ADEQUATE

## 2022-02-04 SURGERY — ECHOCARDIOGRAM, TRANSESOPHAGEAL
Anesthesia: Monitor Anesthesia Care

## 2022-02-04 NOTE — Consult Note (Addendum)
[place]  [phone] [phone]    [place]  Referring MD:  Dr. 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  HPI: Ms. Raven Torres is a 66 year old female with a past medical history significant for COPD, hypertension, hyperlipidemia, history of supraglottic cancer status post chemoradiation with no evidence of disease following treatment, tobacco dependence.  She presented to the emergency department with difficulty breathing and coughing.  She was treated for a COPD exacerbation and community-acquired pneumonia.  She was having difficulty using her left wrist but did not have any other neurologic deficits.  This prompted brain imaging including MRI of the brain without contrast on 01/31/2022 which showed small acute infarcts in the occipital lobe bilaterally, cluster of acute infarcts in the right parietal lobe, findings suggest emboli given different vascular distributions.  CTA head and neck was performed on 01/31/2022 which showed underlying atrophy with chronic small vessel ischemic disease, previously identified right parietal and bilateral occipital infarcts with no associated hemorrhage or mass effect, small focal filling defect along the posterior wall of the proximal cervical right ICA suspicion for thrombus and findings are favored to reflect changes due to recently ruptured plaque.  The patient was outside the window for any intervention.  She has been seen by neurology.  Neurology has recommended aspirin 81 mg daily and anticoagulation with either full dose Lovenox or Eliquis.  TEE was recommended but she was considered high risk for esophageal perforation and not a good candidate for TEE.  This test was not performed.  Today, the patient reports ongoing difficulties dysphagia.  States pills get stuck in her  throat.  She has been working with speech therapy.  Continues to have left wrist weakness.  States that she is trying to compensate.  She is not having any headaches or dizziness.  Denies chest pain.  She still has shortness of breath and is wearing oxygen.  Denies abdominal pain, nausea, vomiting.  No bleeding reported.  Denies adenopathy.  Hematology was asked to the patient make recommendations regarding anticoagulation.  Past Medical History:  Diagnosis Date   Arthritis    Cataract    removed both eyes    Colitis    COPD (chronic obstructive pulmonary disease) (HCC)    Hepatitis C    Hyperlipidemia    Hypertension    Shingles 03/27/2020   TB (tuberculosis)    treatment   :  Past Surgical History:  Procedure Laterality Date   ABDOMINAL HYSTERECTOMY     CATARACT EXTRACTION, BILATERAL     COLONOSCOPY     COLONOSCOPY  04/04/2020   DIRECT LARYNGOSCOPY N/A 06/26/2020   Procedure: DIRECT LARYNGOSCOPY AND BIOPSY;  Surgeon: Rozetta Nunnery, MD;  Location: [facility];  Service: ENT;  Laterality: N/A;   IR GASTROSTOMY TUBE MOD SED  07/27/2020   IR GASTROSTOMY TUBE REMOVAL  04/17/2021   IR IMAGING GUIDED PORT INSERTION  07/27/2020   IR REMOVAL TUN ACCESS W/ PORT W/O FL MOD SED  03/20/2021   KNEE SURGERY Right    POLYPECTOMY  11/17/2008   HPP x 1   : CURRENT MEDS: Current Facility-Administered Medications  Medication Dose Route Frequency Provider Last Rate Last Admin   albuterol (PROVENTIL) (2.5 MG/3ML) 0.083% nebulizer solution 2.5 mg  2.5 mg Nebulization Q2H PRN Starla Link, Kshitiz, MD   2.5 mg at 02/02/22 1424   ALPRAZolam (XANAX) tablet 0.25 mg  0.25 mg Oral BID PRN Barb Merino, MD   0.25 mg at 02/03/22 2106   arformoterol (BROVANA) nebulizer solution 15 mcg  15 mcg Nebulization BID Lorelei Pont, MD   15 mcg at 02/04/22 1610   And   umeclidinium bromide (INCRUSE ELLIPTA) 62.5 MCG/ACT 1 puff  1 puff Inhalation Daily Lorelei Pont, MD   1 puff at 02/04/22 9604   aspirin chewable  tablet 81 mg  81 mg Oral Daily Beulah Gandy A, NP   81 mg at 02/04/22 1121   atorvastatin (LIPITOR) tablet 40 mg  40 mg Oral QHS Alekh, Kshitiz, MD   40 mg at 02/03/22 2106   budesonide (PULMICORT) nebulizer solution 0.5 mg  0.5 mg Nebulization BID Aline August, MD   0.5 mg at 02/04/22 0836   enoxaparin (LOVENOX) injection 40 mg  40 mg Subcutaneous Q24H Ulyess Blossom C, MD   40 mg at 02/03/22 2106   feeding supplement (ENSURE ENLIVE / ENSURE PLUS) liquid 237 mL  237 mL Oral 5 X Daily Barb Merino, MD   237 mL at 02/04/22 1121   ipratropium-albuterol (DUONEB) 0.5-2.5 (3) MG/3ML nebulizer solution 3 mL  3 mL Nebulization Q6H Alekh, Kshitiz, MD   3 mL at 02/04/22 0836   lidocaine (XYLOCAINE) 2 % viscous mouth solution 15 mL  15 mL Mouth/Throat Q4H PRN Lorelei Pont, MD       Oral care mouth rinse  15 mL Mouth Rinse PRN Lorelei Pont, MD       predniSONE (DELTASONE) tablet 40 mg  40 mg Oral Q breakfast Barb Merino, MD       Allergies  Allergen Reactions   Jaci Standard [Aclidinium Bromide] Itching   Amitriptyline Other (See Comments)    Eye spasms per pt   Codeine Nausea Only   Tramadol Nausea Only  : Family History  Problem Relation Age of Onset   Heart disease Mother    Cancer Mother    Melanoma Mother    Esophageal cancer Father    Colon cancer Neg Hx    Colon polyps Neg Hx    Rectal cancer Neg Hx    Stomach cancer Neg Hx   : Social History   Socioeconomic History   Marital status: Married    Spouse name: Not on file   Number of children: Not on file   Years of education: Not on file   Highest education level: Not on file  Occupational History   Not on file  Tobacco Use   Smoking status: Every Day    Packs/day: 1.00    Years: 51.00    Total pack years: 51.00    Types: Cigarettes    Start date: 22   Smokeless tobacco: Never   Tobacco comments:    4 cigs a day   Vaping Use   Vaping Use: Never used  Substance and Sexual Activity   Alcohol use: No     Alcohol/week: 0.0 standard drinks of alcohol   Drug use: No   Sexual activity: Not Currently  Other Topics Concern   Not on file  Social History Narrative   Not on file   Social Determinants of Health   Financial Resource Strain: Not on file  Food Insecurity: No Food Insecurity (07/11/2020)   Hunger Vital Sign    Worried About Running Out of Food in the Last Year: Never true    Ran Out of Food in the Last Year: Never true  Transportation Needs: No Transportation  Needs (07/11/2020)   PRAPARE - Hydrologist (Medical): No    Lack of Transportation (Non-Medical): No  Physical Activity: Not on file  Stress: Not on file  Social Connections: Not on file  Intimate Partner Violence: Not on file  :  REVIEW OF SYSTEMS:  A comprehensive 14 point review of systems was negative except as noted in the HPI.    Exam: Patient Vitals for the past 24 hrs:  BP Temp Temp src Pulse Resp SpO2  02/04/22 0836 -- -- -- -- -- 95 %  02/04/22 0640 (!) 103/56 97.7 F (36.5 C) Oral 70 -- 94 %  02/04/22 0259 -- -- -- -- -- 95 %  02/03/22 2127 125/67 97.6 F (36.4 C) Oral 80 -- 96 %  02/03/22 1946 -- -- -- -- -- 95 %  02/03/22 1900 -- -- -- -- -- 95 %  02/03/22 1417 (!) 89/58 98.1 F (36.7 C) Oral 74 17 96 %  02/03/22 1415 -- -- -- -- -- (!) 65 %    Physical Exam Vitals reviewed.  Constitutional:      Comments: Cachectic appearing  HENT:     Head: Normocephalic.     Mouth/Throat:     Pharynx: No oropharyngeal exudate or posterior oropharyngeal erythema.  Eyes:     General: No scleral icterus.    Conjunctiva/sclera: Conjunctivae normal.  Cardiovascular:     Rate and Rhythm: Normal rate.  Pulmonary:     Effort: Pulmonary effort is normal. No respiratory distress.  Abdominal:     Palpations: Abdomen is soft.     Tenderness: There is no abdominal tenderness.  Lymphadenopathy:     Cervical: No cervical adenopathy.  Skin:    General: Skin is warm and dry.   Neurological:     Mental Status: She is alert and oriented to person, place, and time.    LABS:  Lab Results  Component Value Date   WBC 11.5 (H) 02/02/2022   HGB 13.1 02/02/2022   HCT 42.0 02/02/2022   PLT 169 02/02/2022   GLUCOSE 82 02/02/2022   CHOL 144 02/01/2022   TRIG 65 02/01/2022   HDL 40 (L) 02/01/2022   LDLCALC 91 02/01/2022   ALT 24 01/31/2022   AST 29 01/31/2022   NA 141 02/02/2022   K 4.8 02/02/2022   CL 103 02/02/2022   CREATININE 0.52 02/02/2022   BUN 30 (H) 02/02/2022   CO2 36 (H) 02/02/2022   INR 1.0 07/27/2020   HGBA1C 5.0 02/02/2022    VAS US CAROTID  Result Date: 02/03/2022 Carotid Arterial Duplex Study Patient Name:  Raven Torres [facility]  Date of Exam:   02/02/2022 Medical Rec #: [id number]         Accession #:    [id number] Date of Birth: 1956-03-29         Patient Gender: F Patient Age:   89 years Exam Location:  [facility] Procedure:      VAS US CAROTID Referring Phys: Beulah Gandy --------------------------------------------------------------------------------  Indications:       CVA. Risk Factors:      Hypertension, Diabetes. Limitations        Today's exam was limited due to the body habitus of the                    patient, heavy calcification and the resulting shadowing, the  patient's respiratory variation and patient positioning,                    patient movementm. Comparison Study:  No prior studies. Performing Technologist: Oliver Hum RVT  Examination Guidelines: A complete evaluation includes B-mode imaging, spectral Doppler, color Doppler, and power Doppler as needed of all accessible portions of each vessel. Bilateral testing is considered an integral part of a complete examination. Limited examinations for reoccurring indications may be performed as noted.  Right Carotid Findings: +----------+-------+-------+--------+---------------------------------+--------+           PSV    EDV    StenosisPlaque Description                Comments           cm/s   cm/s                                                     +----------+-------+-------+--------+---------------------------------+--------+ CCA Prox  47     8              smooth, heterogenous and calcific         +----------+-------+-------+--------+---------------------------------+--------+ CCA Distal47     12             smooth and heterogenous                   +----------+-------+-------+--------+---------------------------------+--------+ ICA Prox  82     23             irregular, calcific and                                                   heterogenous                              +----------+-------+-------+--------+---------------------------------+--------+ ICA Distal62     17                                                       +----------+-------+-------+--------+---------------------------------+--------+ ECA       60     12                                                       +----------+-------+-------+--------+---------------------------------+--------+ +----------+--------+-------+--------+-------------------+           PSV cm/sEDV cmsDescribeArm Pressure (mmHG) +----------+--------+-------+--------+-------------------+ [id number]                                        +----------+--------+-------+--------+-------------------+ +---------+--------+--+--------+--+---------+ VertebralPSV cm/s65EDV cm/s14Antegrade +---------+--------+--+--------+--+---------+  Left Carotid Findings: +----------+-------+-------+--------+---------------------------------+--------+           PSV    EDV    StenosisPlaque Description  Comments           cm/s   cm/s                                                     +----------+-------+-------+--------+---------------------------------+--------+ CCA Prox  53     12             smooth and calcific                        +----------+-------+-------+--------+---------------------------------+--------+ CCA Distal51     14             smooth and heterogenous                   +----------+-------+-------+--------+---------------------------------+--------+ ICA Prox  147    21             irregular, heterogenous and                                               calcific                                  +----------+-------+-------+--------+---------------------------------+--------+ ICA Distal77     17                                                       +----------+-------+-------+--------+---------------------------------+--------+ ECA       65     15                                                       +----------+-------+-------+--------+---------------------------------+--------+ +----------+--------+--------+--------+-------------------+           PSV cm/sEDV cm/sDescribeArm Pressure (mmHG) +----------+--------+--------+--------+-------------------+ [id number]                                         +----------+--------+--------+--------+-------------------+ +---------+--------+--+--------+-+---------+ VertebralPSV cm/s27EDV cm/s7Antegrade +---------+--------+--+--------+-+---------+   Summary: Right Carotid: Velocities in the right ICA are consistent with a 1-39% stenosis. Left Carotid: Velocities in the left ICA are consistent with a 1-39% stenosis. Vertebrals: Bilateral vertebral arteries demonstrate antegrade flow. *See table(s) above for measurements and observations.  Electronically signed by Jamelle Haring on 02/03/2022 at 12:16:33 PM.    Final    ECHOCARDIOGRAM COMPLETE  Result Date: 02/01/2022    ECHOCARDIOGRAM REPORT   Patient Name:   Raven Torres [facility] Date of Exam: 02/01/2022 Medical Rec #:  [id number]        Height:       64.0 in Accession #:    [id number]       Weight:       84.7 lb Date of Birth:  12-21-1955        BSA:  1.358 m Patient Age:    1 years          BP:           121/76 mmHg Patient Gender: F                HR:           73 bpm. Exam Location:  Inpatient Procedure: 2D Echo, Cardiac Doppler and Color Doppler Indications:    Stroke  History:        Patient has no prior history of Echocardiogram examinations.                 COPD; Risk Factors:Former Smoker, Hypertension and HLD.  Sonographer:    Joette Catching RCS Referring Phys: [id number] [facility]  Sonographer Comments: Image acquisition challenging due to patient body habitus. IMPRESSIONS  1. Left ventricular ejection fraction, by estimation, is 60 to 65%. The left ventricle has normal function. The left ventricle has no regional wall motion abnormalities. Left ventricular diastolic parameters are consistent with Grade II diastolic dysfunction (pseudonormalization). Elevated left ventricular end-diastolic pressure.  2. Right ventricular systolic function is normal. The right ventricular size is normal.  3. The mitral valve is abnormal. Trivial mitral valve regurgitation. No evidence of mitral stenosis. Moderate mitral annular calcification.  4. The aortic valve is tricuspid. There is mild calcification of the aortic valve. Aortic valve regurgitation is not visualized. Aortic valve sclerosis is present, with no evidence of aortic valve stenosis.  5. The inferior vena cava is normal in size with <50% respiratory variability, suggesting right atrial pressure of 8 mmHg. FINDINGS  Left Ventricle: Left ventricular ejection fraction, by estimation, is 60 to 65%. The left ventricle has normal function. The left ventricle has no regional wall motion abnormalities. The left ventricular internal cavity size was normal in size. There is  no left ventricular hypertrophy. Left ventricular diastolic parameters are consistent with Grade II diastolic dysfunction (pseudonormalization). Elevated left ventricular end-diastolic pressure. Right Ventricle: The right ventricular size is normal. No increase in right  ventricular wall thickness. Right ventricular systolic function is normal. Left Atrium: Left atrial size was normal in size. Right Atrium: Right atrial size was normal in size. Pericardium: Trivial pericardial effusion is present. The pericardial effusion is anterior to the right ventricle. Mitral Valve: The mitral valve is abnormal. There is moderate thickening of the mitral valve leaflet(s). There is moderate calcification of the mitral valve leaflet(s). Moderate mitral annular calcification. Trivial mitral valve regurgitation. No evidence of mitral valve stenosis. Tricuspid Valve: The tricuspid valve is normal in structure. Tricuspid valve regurgitation is not demonstrated. No evidence of tricuspid stenosis. Aortic Valve: The aortic valve is tricuspid. There is mild calcification of the aortic valve. Aortic valve regurgitation is not visualized. Aortic valve sclerosis is present, with no evidence of aortic valve stenosis. Aortic valve mean gradient measures 4.0 mmHg. Aortic valve peak gradient measures 7.2 mmHg. Aortic valve area, by VTI measures 1.73 cm. Pulmonic Valve: The pulmonic valve was normal in structure. Pulmonic valve regurgitation is not visualized. No evidence of pulmonic stenosis. Aorta: The aortic root is normal in size and structure. Venous: The inferior vena cava is normal in size with less than 50% respiratory variability, suggesting right atrial pressure of 8 mmHg. IAS/Shunts: No atrial level shunt detected by color flow Doppler.  LEFT VENTRICLE PLAX 2D LVIDd:         4.00 cm   Diastology LVIDs:         2.50 cm  LV e' medial:    4.46 cm/s LV PW:         0.80 cm   LV E/e' medial:  23.3 LV IVS:        0.90 cm   LV e' lateral:   5.22 cm/s LVOT diam:     1.70 cm   LV E/e' lateral: 19.9 LV SV:         45 LV SV Index:   33 LVOT Area:     2.27 cm  RIGHT VENTRICLE          IVC RV Basal diam:  2.10 cm  IVC diam: 1.90 cm RV Mid diam:    1.60 cm TAPSE (M-mode): 1.4 cm LEFT ATRIUM             Index         RIGHT ATRIUM          Index LA diam:        2.20 cm 1.62 cm/m   RA Area:     5.96 cm LA Vol (A2C):   19.2 ml 14.14 ml/m  RA Volume:   9.07 ml  6.68 ml/m LA Vol (A4C):   18.7 ml 13.77 ml/m LA Biplane Vol: 19.9 ml 14.66 ml/m  AORTIC VALVE                    PULMONIC VALVE AV Area (Vmax):    1.80 cm     PV Vmax:       1.11 m/s AV Area (Vmean):   1.77 cm     PV Peak grad:  4.9 mmHg AV Area (VTI):     1.73 cm AV Vmax:           134.00 cm/s AV Vmean:          89.600 cm/s AV VTI:            0.260 m AV Peak Grad:      7.2 mmHg AV Mean Grad:      4.0 mmHg LVOT Vmax:         106.00 cm/s LVOT Vmean:        69.750 cm/s LVOT VTI:          0.198 m LVOT/AV VTI ratio: 0.76  AORTA Ao Root diam: 2.60 cm MITRAL VALVE                TRICUSPID VALVE MV Area (PHT): 3.46 cm     TR Peak grad:   21.0 mmHg MV Decel Time: 219 msec     TR Vmax:        229.00 cm/s MV E velocity: 104.00 cm/s MV A velocity: 99.00 cm/s   SHUNTS MV E/A ratio:  1.05         Systemic VTI:  0.20 m                             Systemic Diam: 1.70 cm Jenkins Rouge MD Electronically signed by Jenkins Rouge MD Signature Date/Time: 02/01/2022/12:24:43 PM    Final    CT ANGIO HEAD NECK W WO CM  Result Date: 01/31/2022 CLINICAL DATA:  Follow-up examination for acute stroke. EXAM: CT ANGIOGRAPHY HEAD AND NECK TECHNIQUE: Multidetector CT imaging of the head and neck was performed using the standard protocol during bolus administration of intravenous contrast. Multiplanar CT image reconstructions and MIPs were obtained to evaluate the vascular anatomy. Carotid stenosis measurements (when applicable) are obtained  utilizing NASCET criteria, using the distal internal carotid diameter as the denominator. RADIATION DOSE REDUCTION: This exam was performed according to the departmental dose-optimization program which includes automated exposure control, adjustment of the mA and/or kV according to patient size and/or use of iterative reconstruction technique. CONTRAST:   28m OMNIPAQUE IOHEXOL 350 MG/ML SOLN COMPARISON:  MRI from earlier the same day. FINDINGS: CT HEAD FINDINGS Brain: Age-related cerebral atrophy with chronic microvascular ischemic disease. Previously identified acute ischemic infarcts involving the posterior right frontoparietal region and occipital lobes, grossly stable. No associated hemorrhage or mass effect. No other acute large vessel territory infarct or hemorrhage. No midline shift. No hydrocephalus or extra-axial fluid collection. Vascular: No hyperdense vessel. Calcified atherosclerosis present at the skull base. Skull: Scalp soft tissues and calvarium within normal limits. Sinuses: Air-fluid level noted within the left maxillary sinus. Mastoid air cells are clear. Orbits: Globes orbital soft tissues demonstrate no acute finding. Review of the MIP images confirms the above findings CTA NECK FINDINGS Aortic arch: Visualized aortic arch normal in caliber with standard branching pattern. Moderate to advanced atheromatous change about the arch itself. No high-grade stenosis about the origin the great vessels. Right carotid system: Right CCA patent without stenosis. Scattered calcified plaque about the right carotid bulb/proximal right ICA without significant stenosis. Occlusion versus severe stenosis at the origin of the right external carotid artery noted. Distally, there is a small focal filling defect along the posterior wall of the proximal cervical right ICA (series 11, image 160), suspicious for thrombus. Finding favored to reflect changes due to a recently ruptured plaque, although a possible small focal dissection could also be considered. No significant stenosis. Right ICA patent distally without stenosis or other abnormality. Left carotid system: Left CCA patent without stenosis. Bulky calcified plaque about the left carotid bulb/proximal left ICA without hemodynamically significant greater than 50% stenosis. Severe stenosis versus occlusion noted at  the left external carotid artery. Cervical left ICA patent distally without stenosis or other acute finding. Vertebral arteries: Both vertebral arteries arise from subclavian arteries. No significant proximal subclavian artery stenosis. Right vertebral artery dominant. Vertebral arteries patent without stenosis or dissection. Skeleton: No discrete or worrisome osseous lesions. Patient is edentulous. Other neck: No other acute soft tissue abnormality within the neck. Upper chest: Hyperinflation with severe emphysema. Visualized upper chest demonstrates no other acute finding. Shotty subcentimeter mediastinal lymph nodes noted, which could be reactive. Review of the MIP images confirms the above findings CTA HEAD FINDINGS Anterior circulation: Petrous segments patent bilaterally. Atheromatous change seen throughout the carotid siphons with associated moderate multifocal narrowing. A1 segments, anterior communicating complex common anterior cerebral arteries widely patent. No M1 stenosis or occlusion. No proximal MCA branch occlusion. Distal MCA branches perfused and symmetric. Posterior circulation: Right vertebral artery dominant and patent to the vertebrobasilar junction without stenosis. Left vertebral artery hypoplastic. Superimposed atherosclerotic calcification about the proximal left V4 segment with associated moderate multifocal stenoses. Right PICA patent. Left PICA not well seen. Tiny fenestration noted at the vertebrobasilar junction. Basilar patent distally without stenosis. Superior cerebellar arteries patent bilaterally. Both PCAs primarily supplied via the basilar well perfused or distal aspects. Venous sinuses: Patent allowing for timing the contrast bolus. Anatomic variants: None significant.  No aneurysm. Review of the MIP images confirms the above findings IMPRESSION: CT HEAD IMPRESSION: 1. 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 No  associated hemorrhage or mass effect. 2. No other new acute intracranial abnormality. 3. Underlying atrophy with  chronic small vessel ischemic disease. CTA HEAD AND NECK IMPRESSION: 1. Small focal filling defect along the posterior wall of the proximal cervical right ICA, suspicious for thrombus. Finding favored to reflect changes due to a recently ruptured plaque, although a possible small focal dissection could also be considered. No significant stenosis. 2. Moderate atheromatous change about the skull base with associated moderate multifocal narrowing about the siphons and about the proximal left V4 segment. No other hemodynamically significant or correctable stenosis. 3. Emphysema (ICD10-J43.9). Electronically Signed   By: Jeannine Boga M.D.   On: 01/31/2022 22:32   MR BRAIN WO CONTRAST  Result Date: 01/31/2022 CLINICAL DATA:  Acute neuro deficit. EXAM: MRI HEAD WITHOUT CONTRAST TECHNIQUE: Multiplanar, multiecho pulse sequences of the brain and surrounding structures were obtained without intravenous contrast. COMPARISON:  None Available. FINDINGS: Brain: Multiple areas of acute infarct are present. Small acute infarcts in the occipital lobe bilaterally. Patchy areas of acute infarct in the right parietal lobe. No associated hemorrhage. Ventricle size normal. Moderate chronic microvascular ischemic change in the white matter. No mass lesion. Vascular: Normal arterial flow voids in the skull base. Skull and upper cervical spine: No focal abnormality. Sinuses/Orbits: Mucosal edema paranasal sinuses. Left maxillary air-fluid level. Negative orbit Other: None IMPRESSION: Small acute infarcts in the occipital lobe bilaterally. Cluster of acute infarcts in the right parietal lobe. Findings suggest emboli given different vascular distributions. Moderate chronic microvascular ischemic change in the white matter. No acute hemorrhage Electronically Signed   By: Franchot Gallo M.D.   On: 01/31/2022 16:59   DG  Chest Port 1 View  Result Date: 01/30/2022 CLINICAL DATA:  A 66 year old female presents with cough and dyspnea. EXAM: PORTABLE CHEST 1 VIEW COMPARISON:  February 2022. FINDINGS: EKG leads project over the chest. Signs of loss of subcutaneous fat since previous imaging, cachectic appearing on the current study. Lungs are hyperinflated. Cardiomediastinal contours and hilar structures are stable. Patchy interstitial and airspace disease with subtle nodular component in the LEFT mid and lower chest. No visible pneumothorax. No gross signs of pleural effusion. On limited assessment there is no acute skeletal process. IMPRESSION: 1. Patchy interstitial and airspace disease with subtle nodular component in the LEFT mid and lower chest, findings are suspicious for pneumonia superimposed on pulmonary emphysema. Given nodular component would suggest follow-up PA and lateral chest after resolution of symptoms or following therapy as appropriate. 2. Loss of subcutaneous fat compared to previous imaging is dramatic. 3. COPD. Electronically Signed   By: Zetta Bills M.D.   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  Admitted to the hospital for treatment for pneumonia/COPD exacerbation and was found to have acute CVA.  She has been started on 81 mg aspirin and neurology has recommended full dose Lovenox versus Eliquis.  1.  Acute right parietal ischemic infarct.  She will continue on aspirin 81 mg daily per neurology.  Neurology has recommended either full dose Lovenox or Eliquis.  Chart reviewed and discussed with patient.  We discussed taking an oral medication versus a subcu injection.  The patient is not keen to take  injections.  She does have some difficulty swallowing but feels that she could probably swallow the Eliquis tablet if crushed.  Our recommendation would be to proceed with Eliquis.   2.  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  PET scan following treatment showed a complete response to treatment and no concern for residual disease.  From our standpoint, she will continue on observation.  She currently has no clinical or radiographic evidence of disease recurrence.  She will need to reestablish with ENT as an outpatient for follow-up and surveillance.  3.  Cachexia/protein calorie malnutrition.  She has had persistent weight loss following treatment.  Thyroid function has been normal.  Recommend dietitian consult.  4.  Tobacco dependence.  Recommend smoking cessation.  Thank you for this referral.  Mikey Bussing, DNP, AGPCNP-BC, AOCNP   Attending Note  I personally saw the patient, reviewed the chart and examined the patient. The plan of care was discussed with the patient and the admitting team. I agree with the assessment and plan as documented above. Thank you very much for the consultation.  At this time, she is without any evidence of cancer recurrence. Last PET imaging EOT without any evidence of residual disease. I would recommend that she follow neurology recommendations for anticoagulation. She is more keen to take her Eliquis than lovenox.  For weight loss, agree with dietary consult outpatient. I will plan to see her after discharge in the outpatient setting.

## 2022-02-04 NOTE — Progress Notes (Signed)
PROGRESS NOTE    Raven Torres  JQZ:[id number] DOB: 1955/11/29 DOA: 01/30/2022 PCP: Ladell Pier, MD   Brief Narrative:  66 year old with history of supraglottic cancer status post radiation, smoker, COPD, hypertension, hyperlipidemia presented to the ER with about 6 days of worsening shortness of breath, coughing up more sputum and also subjective fever at home.  Felt very weak.  She does have chronic aspiration but currently on regular diet.  Used to have PEG tube but none now. In the emergency room, reportedly she was brought on nonrebreather.  WBC count normal.  Chest x-ray showed advanced COPD changes along with left middle lobe infiltrate.  Admitted with aspiration pneumonia and treated with antibiotics and oxygen.  Does not use oxygen at home.   On 01/31/2022, patient was found to have weakness of the left wrist which had started more than 12 hours ago.  She was found to have thromboembolic stroke as below.  Neurology was consulted.  Assessment & Plan:   Acute right parietal ischemic infarct -Likely embolic secondary to hypercoagulability -Patient was found to have weakness of the left wrist on 01/31/2022 which had started more than 12 hours ago.  Case was discussed with neurology.  Patient was out of the window for any intervention.  Neurology recommended to complete stroke work-up at [facility] and recommended against transfer.  She was found to have infarct as above.  Currently on aspirin 81 mg daily. -CTA head/neck: Extensive atheromatous changes multifocal narrowing, suspected small filling defect along the posterior wall of the proximal cervical right ICA suspicious for thrombus. -LDL 91.  Continue Lipitor.  Recent A1c 4.8. -Echo showed EF of 60 to 65% with grade 2 diastolic dysfunction.  Patient is not a good candidate for TEE as per cardiology input and hence TEE has been canceled -Pulmonary vascular team evaluation appreciated: Recommend aspirin for now and start  full dose Lovenox or Eliquis from February 06, 2022 onwards after discussion with oncology.  Continue Lipitor.  Outpatient follow-up with neurology -PT/OT following.  Recommend SNF.  TOC consult.  Acute respiratory failure with hypoxia Left sided community-acquired pneumonia COPD exacerbation -Currently intermittently requiring 2 L oxygen by nasal cannula.  Completed 5-day course of Rocephin and Zithromax. -Solu-Medrol has been switched to oral steroids.  Continue current nebs and inhalers  Subglottic malignancy status postradiation Chronic dysphagia and aspiration Cancer related cachexia/severe protein calorie malnutrition Generalized conditioning/debility -Diet as per SLP recommendations.  Overall prognosis is guarded to poor.  Nutrition following. -Palliative care following.  Patient will need outpatient palliative care follow-up.  Patient interested in SNF placement.  TOC following. -Throat cancer treated with chemoradiation: Currently not on active treatment  Hypernatremia -Resolved  Leukocytosis -Mild.  Monitor intermittently  Right/left/medial coccygeal stage I pressure injury: Present on admission -Agree with documentation as below.  Continue local wound care. Pressure Injury 01/30/22 Coccyx Right;Left;Medial Stage 1 -  Intact skin with non-blanchable redness of a localized area usually over a bony prominence. Red, pink (Active)  01/30/22 1340  Location: Coccyx  Location Orientation: Right;Left;Medial  Staging: Stage 1 -  Intact skin with non-blanchable redness of a localized area usually over a bony prominence.  Wound Description (Comments): Red, pink  Present on Admission: Yes   DVT prophylaxis: Lovenox Code Status: DNR Family Communication: Daughter at bedside and sister on phone on 02/03/22 Disposition Plan: Status is: Inpatient Remains inpatient appropriate because: Of severity of illness.  Need for SNF placement.  Patient is currently medically stable for SNF  placement.  Consultants: Palliative care/neurology  Procedures: Echo  Antimicrobials: Rocephin and Zithromax from 01/22/2022-02/03/22  Subjective: Patient seen and examined at bedside.  Poor historian.  No chest pain, fever, vomiting or agitation reported.  Still unable to bring up phlegm. Objective: Vitals:   02/03/22 1946 02/03/22 2127 02/04/22 0259 02/04/22 0640  BP:  125/67  (!) 103/56  Pulse:  80  70  Resp:      Temp:  97.6 F (36.4 C)  97.7 F (36.5 C)  TempSrc:  Oral  Oral  SpO2: 95% 96% 95% 94%  Weight:      Height:        Intake/Output Summary (Last 24 hours) at 02/04/2022 0723 Last data filed at 02/03/2022 1300 Gross per 24 hour  Intake 120 ml  Output --  Net 120 ml    Filed Weights   01/30/22 1326 01/30/22 1511  Weight: 38.4 kg 38.4 kg    Examination:  General: No acute distress.  On 2 L oxygen via nasal cannula.  Looks cachectic; extremely deconditioned. ENT/neck: No palpable neck masses or thyromegaly  respiratory: Bilateral decreased breath sounds at bases with scattered crackles  CVS: Rate is mostly controlled currently; S1 and S2 are heard Abdominal: Soft, nontender, still distended; no organomegaly, normal bowel sounds heard.   Extremities: No clubbing; mild lower extremity edema present CNS: Alert and oriented.  No focal neurologic deficit.  Left upper extremity weakness still present  lymph: No lymphadenopathy palpable  skin: No obvious rashes/petechiae  psych: Affect is extremely flat.  Not showing any signs of agitation at this moment  musculoskeletal: No obvious other joint swelling/deformity   Data Reviewed: I have personally reviewed following labs and imaging studies  CBC: Recent Labs  Lab 01/30/22 0937 01/31/22 0457 02/02/22 0640  WBC 10.8* 12.7* 11.5*  NEUTROABS  --   --  10.1*  HGB 14.1 14.2 13.1  HCT 45.7 45.3 42.0  MCV 97.9 97.0 97.2  PLT 176 174 102    Basic Metabolic Panel: Recent Labs  Lab 01/30/22 0937 01/31/22 0457  02/02/22 0640  NA 147* 146* 141  K 3.5 4.0 4.8  CL 115* 103 103  CO2 26 34* 36*  GLUCOSE 112* 143* 82  BUN 18 25* 30*  CREATININE 0.46 0.66 0.52  CALCIUM 6.7* 9.6 9.1  MG  --   --  2.4  PHOS  --   --  2.9    GFR: Estimated Creatinine Clearance: 40.8 mL/min (by C-G formula based on SCr of 0.52 mg/dL). Liver Function Tests: Recent Labs  Lab 01/30/22 0937 01/31/22 0457  AST 28 29  ALT 12 24  ALKPHOS 39 58  BILITOT 1.0 0.7  PROT 5.2* 7.7  ALBUMIN 2.2* 3.2*    No results for input(s): "LIPASE", "AMYLASE" in the last 168 hours. No results for input(s): "AMMONIA" in the last 168 hours. Coagulation Profile: No results for input(s): "INR", "PROTIME" in the last 168 hours. Cardiac Enzymes: No results for input(s): "CKTOTAL", "CKMB", "CKMBINDEX", "TROPONINI" in the last 168 hours. BNP (last 3 results) No results for input(s): "PROBNP" in the last 8760 hours. HbA1C: Recent Labs    02/02/22 0859  HGBA1C 5.0    CBG: No results for input(s): "GLUCAP" in the last 168 hours. Lipid Profile: No results for input(s): "CHOL", "HDL", "LDLCALC", "TRIG", "CHOLHDL", "LDLDIRECT" in the last 72 hours.  Thyroid Function Tests: No results for input(s): "TSH", "T4TOTAL", "FREET4", "T3FREE", "THYROIDAB" in the last 72 hours. Anemia Panel: No results for input(s): "VITAMINB12", "  FOLATE", "FERRITIN", "TIBC", "IRON", "RETICCTPCT" in the last 72 hours. Sepsis Labs: Recent Labs  Lab 01/30/22 0937 01/31/22 0457 02/01/22 0510  PROCALCITON <0.10 0.13 <0.10     Recent Results (from the past 240 hour(s))  Culture, blood (Routine X 2) w Reflex to ID Panel     Status: None (Preliminary result)   Collection Time: 01/30/22 12:15 PM   Specimen: BLOOD  Result Value Ref Range Status   Specimen Description   Final    BLOOD BLOOD LEFT FOREARM Performed at [facility], [place] [address] [place], [place] [address]    Special Requests   Final    BOTTLES DRAWN AEROBIC AND  ANAEROBIC Blood Culture results may not be optimal due to an inadequate volume of blood received in culture bottles Performed at [place] [address], [place], [place] [address]    Culture   Final    NO GROWTH 4 DAYS Performed at [facility], [place] [address], [place], [place] [address]    Report Status PENDING  Incomplete  Culture, blood (Routine X 2) w Reflex to ID Panel     Status: None (Preliminary result)   Collection Time: 01/30/22 12:17 PM   Specimen: BLOOD  Result Value Ref Range Status   Specimen Description   Final    BLOOD LEFT ANTECUBITAL Performed at [place] [address] [place], [place] [address]    Special Requests   Final    BOTTLES DRAWN AEROBIC ONLY Blood Culture adequate volume Performed at [place] [address], [place], [place] [address]    Culture   Final    NO GROWTH 4 DAYS Performed at [facility], [place] [address] [place], [place] [address]    Report Status PENDING  Incomplete  Respiratory (~20 pathogens) panel by PCR     Status: Abnormal   Collection Time: 01/30/22 12:20 PM   Specimen: Nasopharyngeal Swab; Respiratory  Result Value Ref Range Status   Adenovirus NOT DETECTED NOT DETECTED Final   Coronavirus 229E NOT DETECTED NOT DETECTED Final    Comment: (NOTE) The Coronavirus on the Respiratory Panel, DOES NOT test for the novel  Coronavirus (2019 nCoV)    Coronavirus HKU1 NOT DETECTED NOT DETECTED Final   Coronavirus NL63 NOT DETECTED NOT DETECTED Final   Coronavirus OC43 NOT DETECTED NOT DETECTED Final   Metapneumovirus NOT DETECTED NOT DETECTED Final   Rhinovirus / Enterovirus NOT DETECTED NOT DETECTED Final   Influenza A NOT DETECTED NOT DETECTED Final   Influenza B NOT DETECTED NOT DETECTED Final   Parainfluenza Virus 1 NOT DETECTED NOT DETECTED Final   Parainfluenza Virus 2 NOT DETECTED NOT DETECTED Final   Parainfluenza Virus 3 NOT DETECTED NOT  DETECTED Final   Parainfluenza Virus 4 DETECTED (A) NOT DETECTED Final   Respiratory Syncytial Virus NOT DETECTED NOT DETECTED Final   Bordetella pertussis NOT DETECTED NOT DETECTED Final   Bordetella Parapertussis NOT DETECTED NOT DETECTED Final   Chlamydophila pneumoniae NOT DETECTED NOT DETECTED Final   Mycoplasma pneumoniae NOT DETECTED NOT DETECTED Final    Comment: Performed at [facility], [place]. [address] [place], [place] [address]  MRSA Next Gen by PCR, Nasal     Status: None   Collection Time: 01/30/22 12:20 PM   Specimen: Nasopharyngeal Swab; Nasal Swab  Result Value Ref Range Status   MRSA by PCR Next Gen NOT DETECTED NOT DETECTED Final    Comment: (  NOTE) The GeneXpert MRSA Assay (FDA approved for NASAL specimens only), is one component of a comprehensive MRSA colonization surveillance program. It is not intended to diagnose MRSA infection nor to guide or monitor treatment for MRSA infections. Test performance is not FDA approved in patients less than 34 years old. Performed at [facility], [place] [address], [place], [place] [address]          Radiology Studies: VAS US CAROTID  Result Date: 02/03/2022 Carotid Arterial Duplex Study Patient Name:  Raven Torres [facility]  Date of Exam:   02/02/2022 Medical Rec #: [id number]         Accession #:    [id number] Date of Birth: November 04, 1955         Patient Gender: F Patient Age:   62 years Exam Location:  [facility] Procedure:      VAS US CAROTID Referring Phys: Beulah Gandy --------------------------------------------------------------------------------  Indications:       CVA. Risk Factors:      Hypertension, Diabetes. Limitations        Today's exam was limited due to the body habitus of the                    patient, heavy calcification and the resulting shadowing, the                    patient's respiratory variation and patient positioning,                    patient movementm. Comparison Study:  No  prior studies. Performing Technologist: Oliver Hum RVT  Examination Guidelines: A complete evaluation includes B-mode imaging, spectral Doppler, color Doppler, and power Doppler as needed of all accessible portions of each vessel. Bilateral testing is considered an integral part of a complete examination. Limited examinations for reoccurring indications may be performed as noted.  Right Carotid Findings: +----------+-------+-------+--------+---------------------------------+--------+           PSV    EDV    StenosisPlaque Description               Comments           cm/s   cm/s                                                     +----------+-------+-------+--------+---------------------------------+--------+ CCA Prox  47     8              smooth, heterogenous and calcific         +----------+-------+-------+--------+---------------------------------+--------+ CCA Distal47     12             smooth and heterogenous                   +----------+-------+-------+--------+---------------------------------+--------+ ICA Prox  82     23             irregular, calcific and                                                   heterogenous                              +----------+-------+-------+--------+---------------------------------+--------+  ICA Distal62     17                                                       +----------+-------+-------+--------+---------------------------------+--------+ ECA       60     12                                                       +----------+-------+-------+--------+---------------------------------+--------+ +----------+--------+-------+--------+-------------------+           PSV cm/sEDV cmsDescribeArm Pressure (mmHG) +----------+--------+-------+--------+-------------------+ [id number]                                        +----------+--------+-------+--------+-------------------+  +---------+--------+--+--------+--+---------+ VertebralPSV cm/s65EDV cm/s14Antegrade +---------+--------+--+--------+--+---------+  Left Carotid Findings: +----------+-------+-------+--------+---------------------------------+--------+           PSV    EDV    StenosisPlaque Description               Comments           cm/s   cm/s                                                     +----------+-------+-------+--------+---------------------------------+--------+ CCA Prox  53     12             smooth and calcific                       +----------+-------+-------+--------+---------------------------------+--------+ CCA Distal51     14             smooth and heterogenous                   +----------+-------+-------+--------+---------------------------------+--------+ ICA Prox  147    21             irregular, heterogenous and                                               calcific                                  +----------+-------+-------+--------+---------------------------------+--------+ ICA Distal77     17                                                       +----------+-------+-------+--------+---------------------------------+--------+ ECA       65     15                                                       +----------+-------+-------+--------+---------------------------------+--------+ +----------+--------+--------+--------+-------------------+  PSV cm/sEDV cm/sDescribeArm Pressure (mmHG) +----------+--------+--------+--------+-------------------+ [id number]                                         +----------+--------+--------+--------+-------------------+ +---------+--------+--+--------+-+---------+ VertebralPSV cm/s27EDV cm/s7Antegrade +---------+--------+--+--------+-+---------+   Summary: Right Carotid: Velocities in the right ICA are consistent with a 1-39% stenosis. Left Carotid: Velocities in the left ICA are  consistent with a 1-39% stenosis. Vertebrals: Bilateral vertebral arteries demonstrate antegrade flow. *See table(s) above for measurements and observations.  Electronically signed by Jamelle Haring on 02/03/2022 at 12:16:33 PM.    Final         Scheduled Meds:  arformoterol  15 mcg Nebulization BID   And   umeclidinium bromide  1 puff Inhalation Daily   aspirin  81 mg Oral Daily   atorvastatin  40 mg Oral QHS   budesonide (PULMICORT) nebulizer solution  0.5 mg Nebulization BID   enoxaparin (LOVENOX) injection  40 mg Subcutaneous Q24H   feeding supplement  237 mL Oral 5 X Daily   ipratropium-albuterol  3 mL Nebulization Q6H   predniSONE  40 mg Oral Q breakfast   Continuous Infusions:          Aline August, MD [facility] 02/04/2022, 7:23 AM

## 2022-02-04 NOTE — Progress Notes (Signed)
Chaplain engaged in an initial visit with Raven Torres.  Chaplain introduced herself and Raven Torres was grateful to be able to talk about God. Raven Torres expressed how God has been good to her.  Raven Torres believes in being joyful and finding positivity in life.   Chaplain talked with Raven Torres about the Scientist, physiological, Healthcare POA.  Raven Torres wasn't aware if she had done the paperwork or not.  Chaplain was able to speak with Raven Torres's sister in [place] who is coming in later today.    Chaplain prayed with Raven Torres and offered her support.     02/04/22 1200  Clinical Encounter Type  Visited With Patient  Visit Type Initial;Spiritual support;Social support  Referral From Palliative care team  Consult/Referral To Chaplain  Spiritual Encounters  Spiritual Needs Literature;Brochure

## 2022-02-04 NOTE — Progress Notes (Signed)
Speech Language Pathology Treatment: Dysphagia  Patient Details Name: Raven Torres MRN: [id number] DOB: 07-25-1956 Today's Date: 02/04/2022 Time: 4315-4008 SLP Time Calculation (min) (ACUTE ONLY): 15 min  Assessment / Plan / Recommendation Clinical Impression  Patient seen by SLP for skilled treatment focused on dysphagia goals. When SLP arrived in room, patient on phone with her daughter and looking over a list of SNF's that the social worker had printed out for her. She did not c/o any difficulties with her swallowing however she is an unreliable historian. SLP observed patient with straw sips of thin liquids (water) with patient exhibiting intermittent, infrequent throat clearing. Patient's swallow function is likely not significantly changed from her recent baseline (see 11/27/21 Esophagram and 12/14/21 MBS). SLP to s/o at this time but recommending f/u at SNF for continued patient/family education on swallow precautions.    HPI HPI: 66 yo female adm to [facility] with respiratory difficulties, CXR concerning for pna.  Pt has complex medical hx including COPD COVID 19 09/2020, supralaryngeal cancer x/p chemo/XRT, continues to smoke, moderate to severe esophageal dysmotility diagnosed 11/2021 per esophagram, mild pharyngeal based dysphagia diagnosed as an OP 12/14/2021.  She has had a PEG in the past that was removed.  CXR concerning for left lung infectious pna and pt requiring NRB for respiratory support. Pt admits to weight loss ongoing but reports her swallowing is "better".  Bedside swallow ordered and completed 01/30/22. On 02/02/22, patient made NPO and BSE reordered.      SLP Plan  Discharge SLP treatment due to (comment);All goals met      Recommendations for follow up therapy are one component of a multi-disciplinary discharge planning process, led by the attending physician.  Recommendations may be updated based on patient status, additional functional criteria and insurance authorization.     Recommendations  Diet recommendations: Dysphagia 3 (mechanical soft);Thin liquid Liquids provided via: Straw Medication Administration: Crushed with puree Supervision: Patient able to self feed Compensations: Small sips/bites;Slow rate;Follow solids with liquid Postural Changes and/or Swallow Maneuvers: Chin tuck                Oral Care Recommendations: Oral care BID Follow Up Recommendations: Skilled nursing-short term rehab (<3 hours/day) Assistance recommended at discharge: Frequent or constant Supervision/Assistance SLP Visit Diagnosis: Dysphagia, unspecified (R13.10) Plan: Discharge SLP treatment due to (comment);All goals met         Sonia Baller, MA, CCC-SLP Speech Therapy

## 2022-02-04 NOTE — Plan of Care (Signed)
  Problem: Education: Goal: Knowledge of disease or condition will improve Outcome: Progressing   Problem: Coping: Goal: Will verbalize positive feelings about self Outcome: Progressing Goal: Will identify appropriate support needs Outcome: Progressing   Problem: Self-Care: Goal: Ability to participate in self-care as condition permits will improve Outcome: Progressing   Problem: Ischemic Stroke/TIA Tissue Perfusion: Goal: Complications of ischemic stroke/TIA will be minimized Outcome: Progressing   Problem: Nutrition: Goal: Risk of aspiration will decrease Outcome: Not Progressing

## 2022-02-04 NOTE — Progress Notes (Signed)
Received phone call from [facility] regarding TEE and transport for patient via [facility].  Patient with no active NPO order, spoke with Dr. Starla Link who deferred to Cardiology.  This RN reached out to [place] who stated that Cardiology is not following patient as she is not a TEE candidate based on note from [facility] Cardiology NP Harlan Stains written on 7/1. This RN called back to [facility] to make them aware of cardiology's insight to not completing TEE and Butch Penny stated that it was correct and Ms. Sandfords procedure was cancelled for today.

## 2022-02-04 NOTE — Care Management Important Message (Signed)
Important Message  Patient Details IM Letter placed in Patients room. Name: Raven Torres MRN: [id number] Date of Birth: [birth year]   Medicare Important Message Given:  Yes     Kerin Salen 02/04/2022, 10:47 AM

## 2022-02-04 NOTE — TOC Progression Note (Addendum)
Transition of Care [facility]) - Progression Note    Patient Details  Name: Raven Torres MRN: [id number] Date of Birth: August 19, 1955  Transition of Care [facility]) CM/SW [place], [place] Phone Number: 02/04/2022, 12:58 PM  Clinical Narrative:    Spoke with pt's sister, [name] and reviewed SNF bed offers. Pt's sister has accepted bed offer for [facility] and requested pt have a private room if one is available. CSW has contacted [facility] regarding bed offer and currently awaiting to hear back.   [facility] has bed availability for pt to have a private room. Insurance Josem Kaufmann has been started and currently awaiting approval.    Expected Discharge Plan: [place] Barriers to Discharge: Continued Medical Work up  Expected Discharge Plan and Services Expected Discharge Plan: [place] In-house Referral: Clinical Social Work Discharge Planning Services: CM Consult Post Acute Care Choice: [place] Living arrangements for the past 2 months: Single Family Home                                       Social Determinants of Health (SDOH) Interventions    Readmission Risk Interventions     No data to display

## 2022-02-04 NOTE — Progress Notes (Signed)
Palliative:  HPI: 66 y.o. 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 Admitted with acute COPD exacerbation. Noted cachexia with significant weight loss and previously declined feeding tube. MRI with evidence of multiple small acute infarcts. Poor prognosis with overall failure to thrive.    I met with Raven Torres along with chaplain, Raven Torres. Raven Torres is making attempts to have HCPOA/Living Will completed. Raven Torres is alert and oriented (although forgetful at times and very distracted - difficult to maintain on subject of conversation). I speak with Raven Torres about her wishes and desires and completing paperwork. She is consistent in her wishes for DNR and no feeding tubes. She talks of having peace when it is her time. She continues to name sister, Raven Torres, as her Media planner and is trying to decide if she wishes for any other names for HCPOA as well. She plans to speak more with her sister, Raven Torres, about this. RN also reports that she had a long conversation with sister, Raven Torres, who voiced many concerns of her sister's care plan. Unfortunately [name] unable to speak with Raven Torres today but will call and speak with her tomorrow.   All questions/concerns addressed. Emotional support provided.   Exam: Alert, oriented although forgetful. Difficult to maintain on conversation but she is appropriate in her responses and answers. Cachectic, frail. No distress. Breathing regular but with cough. Abd flat. Moves all extremities.   Plan: - DNR - NO feeding tube - Outpatient palliative to follow at SNF rehab   35 min   Raven Sill, NP Palliative Medicine Team Pager [phone] (Please see [email or web address] for schedule) Team Phone [phone]    Greater than 50%  of this time was spent counseling and  coordinating care related to the above assessment and plan

## 2022-02-04 NOTE — Progress Notes (Signed)
Physical Therapy Treatment Patient Details Name: Raven Torres MRN: [id number] DOB: [birth year] Today's Date: 02/04/2022   History of Present Illness 66 y.o. female with medical history of supraglottic cancer status postradiation, smoker, COPD, hypertension, high cholesterol here for worsening breathing over the past 6 days. Dx of PNA. Found to have wrist drop on 6/29 and MRi positive for CVA.    PT Comments    Pt continues to participate fairly well. She fatigues and become dyspneic easily with activity. She did not feel comfortable mobilizing without O2 today so left on 2L [place] for ambulation-sats 94%. She remains unsteady. Recommendation is for ST SNF for rehab.     Recommendations for follow up therapy are one component of a multi-disciplinary discharge planning process, led by the attending physician.  Recommendations may be updated based on patient status, additional functional criteria and insurance authorization.  Follow Up Recommendations  Skilled nursing-short term rehab (<3 hours/day)     Assistance Recommended at Discharge Frequent or constant Supervision/Assistance  Patient can return home with the following A little help with walking and/or transfers;A little help with bathing/dressing/bathroom;Assistance with cooking/housework;Assist for transportation   Equipment Recommendations   (TBD at next venue)    Recommendations for Other Services       Precautions / Restrictions Precautions Precaution Comments: monitor o2 sat Restrictions Weight Bearing Restrictions: No     Mobility  Bed Mobility   Bed Mobility: Supine to Sit, Sit to Supine     Supine to sit: Supervision, HOB elevated Sit to supine: Supervision, HOB elevated   General bed mobility comments: Supv for safety.    Transfers Overall transfer level: Needs assistance Equipment used: None Transfers: Sit to/from Stand Sit to Stand: Min guard           General transfer comment: Min guard for  safety. Cues for safety, technique. Remained on [place] O2 at pt's request.    Ambulation/Gait Ambulation/Gait assistance: Min assist Gait Distance (Feet): 50 Feet (x2) Assistive device: None (vs hallway handrail) Gait Pattern/deviations: Step-through pattern, Decreased stride length       General Gait Details: Min A to steady intermittently. Slow, guarded gait. O2 94% on 2L O2. Dyspnea 2/4. Pt reported chest tightness with exertion. Seated rest break taken after ~50 feet.   Stairs             Wheelchair Mobility    Modified Rankin (Stroke Patients Only)       Balance           Standing balance support: Single extremity supported Standing balance-Leahy Scale: Fair                              Cognition Arousal/Alertness: Awake/alert Behavior During Therapy: Anxious Overall Cognitive Status: Within Functional Limits for tasks assessed                                 General Comments: some mild memory issues, anxiety, poor attention        Exercises      General Comments        Pertinent Vitals/Pain Pain Assessment Pain Assessment: No/denies pain    Home Living                          Prior Function  PT Goals (current goals can now be found in the care plan section) Progress towards PT goals: Progressing toward goals    Frequency    Min 3X/week      PT Plan Discharge plan needs to be updated    Co-evaluation              AM-PAC PT "6 Clicks" Mobility   Outcome Measure  Help needed turning from your back to your side while in a flat bed without using bedrails?: A Little Help needed moving from lying on your back to sitting on the side of a flat bed without using bedrails?: A Little Help needed moving to and from a bed to a chair (including a wheelchair)?: A Little Help needed standing up from a chair using your arms (e.g., wheelchair or bedside chair)?: A Little Help needed to walk in  hospital room?: A Little Help needed climbing 3-5 steps with a railing? : A Lot 6 Click Score: 17    End of Session Equipment Utilized During Treatment: Gait belt;Oxygen Activity Tolerance: Patient limited by fatigue Patient left: in bed;with call bell/phone within reach;with bed alarm set   PT Visit Diagnosis: Difficulty in walking, not elsewhere classified (R26.2)     Time: 3094-0768 PT Time Calculation (min) (ACUTE ONLY): 38 min  Charges:  $Gait Training: 23-37 mins                        Doreatha Massed, PT Acute Rehabilitation  Office: [phone] Pager: [phone]

## 2022-02-04 NOTE — Progress Notes (Signed)
Chaplain provided education on how to complete the Healthcare POA documents with Tula and her sister, [name].  A Chaplain can complete notarization of the paperwork tomorrow.  It is located in the cabinet above the sink.  Notary was not currently available.    Chaplain encouraged them both to continue talking with Palliative Care because they had questions about DNR, ventilators, pain medicine and more.  Chaplain worked to answer questions within her scope of practice.   Chaplain made sure several times that Raven Torres wanted to only put her sister, [name], down and she agreed that that was the best decision. Chaplain offered listening and support.     02/04/22 1600  Clinical Encounter Type  Visited With Patient and family together  Visit Type Follow-up;Social support

## 2022-02-05 ENCOUNTER — Inpatient Hospital Stay (HOSPITAL_COMMUNITY): Payer: Medicare Other

## 2022-02-05 DIAGNOSIS — I6389 Other cerebral infarction: Secondary | ICD-10-CM | POA: Diagnosis not present

## 2022-02-05 DIAGNOSIS — E43 Unspecified severe protein-calorie malnutrition: Secondary | ICD-10-CM | POA: Diagnosis not present

## 2022-02-05 DIAGNOSIS — Z7189 Other specified counseling: Secondary | ICD-10-CM | POA: Diagnosis not present

## 2022-02-05 DIAGNOSIS — Z515 Encounter for palliative care: Secondary | ICD-10-CM | POA: Diagnosis not present

## 2022-02-05 DIAGNOSIS — E870 Hyperosmolality and hypernatremia: Secondary | ICD-10-CM | POA: Diagnosis not present

## 2022-02-05 DIAGNOSIS — R627 Adult failure to thrive: Secondary | ICD-10-CM

## 2022-02-05 DIAGNOSIS — J9601 Acute respiratory failure with hypoxia: Secondary | ICD-10-CM | POA: Diagnosis not present

## 2022-02-05 DIAGNOSIS — C321 Malignant neoplasm of supraglottis: Secondary | ICD-10-CM | POA: Diagnosis not present

## 2022-02-05 DIAGNOSIS — J189 Pneumonia, unspecified organism: Secondary | ICD-10-CM | POA: Diagnosis not present

## 2022-02-05 MED ORDER — ATORVASTATIN CALCIUM 40 MG PO TABS: 40.0000 mg | ORAL_TABLET | Freq: Every day | ORAL | 0 refills | Status: DC

## 2022-02-05 MED ORDER — SODIUM CHLORIDE (PF) 0.9 % IJ SOLN
INTRAMUSCULAR | Status: AC
  Filled 2022-02-05: qty 50

## 2022-02-05 MED ORDER — APIXABAN 5 MG PO TABS: 5.0000 mg | ORAL_TABLET | Freq: Two times a day (BID) | ORAL | 0 refills | Status: DC

## 2022-02-05 MED ORDER — IOHEXOL 9 MG/ML PO SOLN
500.0000 mL | ORAL | Status: AC
  Administered 2022-02-05 (×2): 500 mL via ORAL

## 2022-02-05 MED ORDER — PREDNISONE 20 MG PO TABS: 40.0000 mg | ORAL_TABLET | Freq: Every day | ORAL | 0 refills | Status: AC

## 2022-02-05 MED ORDER — LIDOCAINE VISCOUS HCL 2 % MT SOLN: 15.0000 mL | Freq: Four times a day (QID) | OROMUCOSAL | Status: DC

## 2022-02-05 MED ORDER — GUAIFENESIN-DM 100-10 MG/5ML PO SYRP
5.0000 mL | ORAL_SOLUTION | ORAL | Status: DC | PRN
  Administered 2022-02-05 – 2022-02-06 (×2): 5 mL via ORAL
  Filled 2022-02-05 (×2): qty 5

## 2022-02-05 MED ORDER — IOHEXOL 300 MG/ML  SOLN
75.0000 mL | Freq: Once | INTRAMUSCULAR | Status: AC | PRN
  Administered 2022-02-05: 75 mL via INTRAVENOUS

## 2022-02-05 MED ORDER — ASPIRIN 81 MG PO CHEW: 81.0000 mg | CHEWABLE_TABLET | Freq: Every day | ORAL | 0 refills | Status: DC

## 2022-02-05 MED ORDER — CLOPIDOGREL BISULFATE 75 MG PO TABS: 75.0000 mg | ORAL_TABLET | Freq: Every day | ORAL | 0 refills | Status: DC

## 2022-02-05 MED ORDER — IPRATROPIUM-ALBUTEROL 0.5-2.5 (3) MG/3ML IN SOLN
3.0000 mL | Freq: Three times a day (TID) | RESPIRATORY_TRACT | Status: DC
  Administered 2022-02-05 – 2022-02-07 (×8): 3 mL via RESPIRATORY_TRACT
  Filled 2022-02-05 (×9): qty 3

## 2022-02-05 MED ORDER — ALPRAZOLAM 0.25 MG PO TABS: 0.2500 mg | ORAL_TABLET | Freq: Two times a day (BID) | ORAL | 0 refills | Status: DC | PRN

## 2022-02-05 MED ORDER — CLOPIDOGREL BISULFATE 75 MG PO TABS
75.0000 mg | ORAL_TABLET | Freq: Every day | ORAL | Status: DC
  Administered 2022-02-05 – 2022-02-07 (×3): 75 mg via ORAL
  Filled 2022-02-05 (×3): qty 1

## 2022-02-05 MED ORDER — ENSURE ENLIVE PO LIQD: 237.0000 mL | Freq: Every day | ORAL | Status: DC

## 2022-02-05 NOTE — Discharge Summary (Signed)
Physician Discharge Summary  Raven Torres UEA:[id number] DOB: 31-Oct-1955 DOA: 01/30/2022  PCP: Ladell Pier, MD  Admit date: 01/30/2022 Discharge date: 02/05/2022  Admitted From: Home Disposition: SNF  Recommendations for Outpatient Follow-up:  Follow up with SNF provider at earliest convenience Outpatient follow-up with oncology/palliative care/neurology Follow up in ED if symptoms worsen or new appear   Home Health: No Equipment/Devices: Oxygen via 2 L/min nasal cannula  Discharge Condition: Guarded  CODE STATUS: DNR  diet recommendation: As per SLP recommendations    Diet recommendations: Dysphagia 3 (mechanical soft);Thin liquid Liquids provided via: Straw Medication Administration: Crushed with puree Supervision: Patient able to self feed Compensations: Small sips/bites;Slow rate;Follow solids with liquid Postural Changes and/or Swallow Maneuvers: Chin tuck    Brief/Interim Summary: 66 year old with history of supraglottic cancer status post radiation, smoker, COPD, hypertension, hyperlipidemia presented to the ER with about 6 days of worsening shortness of breath, coughing up more sputum and also subjective fever at home.  Felt very weak.  She does have chronic aspiration but currently on regular diet.  Used to have PEG tube but none now. In the emergency room, reportedly she was brought on nonrebreather.  WBC count normal.  Chest x-ray showed advanced COPD changes along with left middle lobe infiltrate.  Admitted with aspiration pneumonia and treated with antibiotics and oxygen.  Does not use oxygen at home.   On 01/31/2022, patient was found to have weakness of the left wrist which had started more than 12 hours ago.  She was found to have thromboembolic stroke as below.  Neurology was consulted.  Neurology recommended to start anticoagulation from 02/06/2022 and outpatient follow-up with neurology.  Oncology recommended Eliquis for anticoagulation.  Overall prognosis  is very poor.  Palliative care has evaluated the patient.  Outpatient palliative care follow-up will be needed.  PT recommended SNF placement.  She will be discharged to SNF once bed is available.  Discharge Diagnoses:   Acute right parietal ischemic infarct -Likely embolic secondary to hypercoagulability -Patient was found to have weakness of the left wrist on 01/31/2022 which had started more than 12 hours ago.  Case was discussed with neurology.  Patient was out of the window for any intervention.  Neurology recommended to complete stroke work-up at [facility] and recommended against transfer.  She was found to have infarct as above.  Currently on aspirin 81 mg daily. -CTA head/neck: Extensive atheromatous changes multifocal narrowing, suspected small filling defect along the posterior wall of the proximal cervical right ICA suspicious for thrombus. -LDL 91.  Continue Lipitor.  Recent A1c 4.8. -Echo showed EF of 60 to 65% with grade 2 diastolic dysfunction.  Patient is not a good candidate for TEE as per cardiology input and hence TEE has been canceled -Neurology evaluation appreciated: Recommend aspirin for now and start full dose anticoagulation from February 06, 2022 onwards after discussion with oncology.  Oncology recommended Eliquis.  She will be started on Eliquis from 02/06/2022 onwards.  Aspirin can then be discontinued.  Continue Lipitor.  Outpatient follow-up with neurology -PT/OT following.  Recommend SNF.  Discharge to SNF once bed is available.  Acute respiratory failure with hypoxia Left sided community-acquired pneumonia COPD exacerbation -Currently intermittently requiring 2 L oxygen by nasal cannula.  Completed 5-day course of Rocephin and Zithromax. -Solu-Medrol has been switched to oral steroids.  Continue prednisone 40 mg daily for 7 days.  Continue inhaled regimen.  Outpatient follow-up with pulmonary.  Subglottic malignancy status postradiation Chronic dysphagia and  aspiration Cancer related cachexia/severe protein calorie malnutrition Generalized conditioning/debility -Diet as per SLP recommendations.  Overall prognosis is guarded to poor.  Nutrition following. -Palliative care following.  Patient will need outpatient palliative care follow-up.  Patient interested in SNF placement.  TOC following. -Throat cancer treated with chemoradiation: Currently not on active treatment -Outpatient follow-up with oncology.   Hypernatremia -Resolved  Leukocytosis -Mild.     Right/left/medial coccygeal stage I pressure injury: Present on admission -Agree with documentation as below.  Continue local wound care. Pressure Injury 01/30/22 Coccyx Right;Left;Medial Stage 1 -  Intact skin with non-blanchable redness of a localized area usually over a bony prominence. Red, pink (Active)  01/30/22 1340  Location: Coccyx  Location Orientation: Right;Left;Medial  Staging: Stage 1 -  Intact skin with non-blanchable redness of a localized area usually over a bony prominence.  Wound Description (Comments): Red, pink  Present on Admission: Yes    Discharge Instructions  Discharge Instructions     Amb Referral to Palliative Care   Complete by: As directed    Ambulatory referral to Hematology / Oncology   Complete by: As directed    Ambulatory referral to Neurology   Complete by: As directed    An appointment is requested in approximately: 2 weeks   Diet - low sodium heart healthy   Complete by: As directed    Increase activity slowly   Complete by: As directed    No wound care   Complete by: As directed       Allergies as of 02/05/2022       Reactions   Tudorza Pressair [aclidinium Bromide] Itching   Amitriptyline Other (See Comments)   Eye spasms per pt   Codeine Nausea Only   Tramadol Nausea Only        Medication List     STOP taking these medications    varenicline 0.5 MG X 11 & 1 MG X 42 tablet Commonly known as: Chantix Starting Month Pak        TAKE these medications    albuterol (2.5 MG/3ML) 0.083% nebulizer solution Commonly known as: PROVENTIL Take 3 mLs (2.5 mg total) by nebulization every 6 (six) hours as needed for wheezing or shortness of breath.   albuterol 108 (90 Base) MCG/ACT inhaler Commonly known as: VENTOLIN HFA INHALE 2 PUFFS INTO THE LUNGS EVERY 6 HOURS AS NEEDED FOR WHEEZING OR SHORTNESS OF BREATH   ALPRAZolam 0.25 MG tablet Commonly known as: XANAX Take 1 tablet (0.25 mg total) by mouth 2 (two) times daily as needed for anxiety.   apixaban 5 MG Tabs tablet Commonly known as: ELIQUIS Take 1 tablet (5 mg total) by mouth 2 (two) times daily. Start taking on: February 06, 2022   atorvastatin 40 MG tablet Commonly known as: LIPITOR Take 1 tablet (40 mg total) by mouth at bedtime.   Bevespi Aerosphere 9-4.8 MCG/ACT Aero Generic drug: Glycopyrrolate-Formoterol Inhale 2 puffs into the lungs 2 (two) times daily.   feeding supplement Liqd Take 237 mLs by mouth 5 (five) times daily. What changed: when to take this   lidocaine 2 % solution Commonly known as: XYLOCAINE Use as directed 15 mLs in the mouth or throat in the morning, at noon, in the evening, and at bedtime. Patient: Mix 1part 2% viscous lidocaine, 1part H20. Swallow 32m of diluted mixture, 375m before meals and at bedtime, up to QID   predniSONE 20 MG tablet Commonly known as: DELTASONE Take 2 tablets (40 mg total) by mouth daily with  breakfast for 7 days. Start taking on: February 06, 2022        Contact information for after-discharge care     Destination     [facility] Preferred SNF .   Service: Skilled Nursing Contact information: [place] [address] [phone]                    Allergies  Allergen Reactions   Jaci Standard [Aclidinium Bromide] Itching   Amitriptyline Other (See Comments)    Eye spasms per pt   Codeine Nausea Only   Tramadol Nausea Only     Consultations: Palliative care/neurology/oncology   Procedures/Studies: VAS US CAROTID  Result Date: 02/03/2022 Carotid Arterial Duplex Study Patient Name:  KERYN NESSLER [facility]  Date of Exam:   02/02/2022 Medical Rec #: [id number]         Accession #:    [id number] Date of Birth: 1955-08-07         Patient Gender: F Patient Age:   62 years Exam Location:  [facility] Procedure:      VAS US CAROTID Referring Phys: Beulah Gandy --------------------------------------------------------------------------------  Indications:       CVA. Risk Factors:      Hypertension, Diabetes. Limitations        Today's exam was limited due to the body habitus of the                    patient, heavy calcification and the resulting shadowing, the                    patient's respiratory variation and patient positioning,                    patient movementm. Comparison Study:  No prior studies. Performing Technologist: Oliver Hum RVT  Examination Guidelines: A complete evaluation includes B-mode imaging, spectral Doppler, color Doppler, and power Doppler as needed of all accessible portions of each vessel. Bilateral testing is considered an integral part of a complete examination. Limited examinations for reoccurring indications may be performed as noted.  Right Carotid Findings: +----------+-------+-------+--------+---------------------------------+--------+           PSV    EDV    StenosisPlaque Description               Comments           cm/s   cm/s                                                     +----------+-------+-------+--------+---------------------------------+--------+ CCA Prox  47     8              smooth, heterogenous and calcific         +----------+-------+-------+--------+---------------------------------+--------+ CCA Distal47     12             smooth and heterogenous                   +----------+-------+-------+--------+---------------------------------+--------+  ICA Prox  82     23             irregular, calcific and  heterogenous                              +----------+-------+-------+--------+---------------------------------+--------+ ICA Distal62     17                                                       +----------+-------+-------+--------+---------------------------------+--------+ ECA       60     12                                                       +----------+-------+-------+--------+---------------------------------+--------+ +----------+--------+-------+--------+-------------------+           PSV cm/sEDV cmsDescribeArm Pressure (mmHG) +----------+--------+-------+--------+-------------------+ [id number]                                        +----------+--------+-------+--------+-------------------+ +---------+--------+--+--------+--+---------+ VertebralPSV cm/s65EDV cm/s14Antegrade +---------+--------+--+--------+--+---------+  Left Carotid Findings: +----------+-------+-------+--------+---------------------------------+--------+           PSV    EDV    StenosisPlaque Description               Comments           cm/s   cm/s                                                     +----------+-------+-------+--------+---------------------------------+--------+ CCA Prox  53     12             smooth and calcific                       +----------+-------+-------+--------+---------------------------------+--------+ CCA Distal51     14             smooth and heterogenous                   +----------+-------+-------+--------+---------------------------------+--------+ ICA Prox  147    21             irregular, heterogenous and                                               calcific                                  +----------+-------+-------+--------+---------------------------------+--------+ ICA Distal77     17                                                        +----------+-------+-------+--------+---------------------------------+--------+ ECA       65     15                                                       +----------+-------+-------+--------+---------------------------------+--------+ +----------+--------+--------+--------+-------------------+  PSV cm/sEDV cm/sDescribeArm Pressure (mmHG) +----------+--------+--------+--------+-------------------+ [id number]                                         +----------+--------+--------+--------+-------------------+ +---------+--------+--+--------+-+---------+ VertebralPSV cm/s27EDV cm/s7Antegrade +---------+--------+--+--------+-+---------+   Summary: Right Carotid: Velocities in the right ICA are consistent with a 1-39% stenosis. Left Carotid: Velocities in the left ICA are consistent with a 1-39% stenosis. Vertebrals: Bilateral vertebral arteries demonstrate antegrade flow. *See table(s) above for measurements and observations.  Electronically signed by Jamelle Haring on 02/03/2022 at 12:16:33 PM.    Final    ECHOCARDIOGRAM COMPLETE  Result Date: 02/01/2022    ECHOCARDIOGRAM REPORT   Patient Name:   KENTRELL GUETTLER [facility] Date of Exam: 02/01/2022 Medical Rec #:  [id number]        Height:       64.0 in Accession #:    [id number]       Weight:       84.7 lb Date of Birth:  1956/07/18        BSA:          1.358 m Patient Age:    24 years         BP:           121/76 mmHg Patient Gender: F                HR:           73 bpm. Exam Location:  Inpatient Procedure: 2D Echo, Cardiac Doppler and Color Doppler Indications:    Stroke  History:        Patient has no prior history of Echocardiogram examinations.                 COPD; Risk Factors:Former Smoker, Hypertension and HLD.  Sonographer:    Joette Catching RCS Referring Phys: [id number] [facility]  Sonographer Comments: Image acquisition challenging due to patient body habitus. IMPRESSIONS  1. Left  ventricular ejection fraction, by estimation, is 60 to 65%. The left ventricle has normal function. The left ventricle has no regional wall motion abnormalities. Left ventricular diastolic parameters are consistent with Grade II diastolic dysfunction (pseudonormalization). Elevated left ventricular end-diastolic pressure.  2. Right ventricular systolic function is normal. The right ventricular size is normal.  3. The mitral valve is abnormal. Trivial mitral valve regurgitation. No evidence of mitral stenosis. Moderate mitral annular calcification.  4. The aortic valve is tricuspid. There is mild calcification of the aortic valve. Aortic valve regurgitation is not visualized. Aortic valve sclerosis is present, with no evidence of aortic valve stenosis.  5. The inferior vena cava is normal in size with <50% respiratory variability, suggesting right atrial pressure of 8 mmHg. FINDINGS  Left Ventricle: Left ventricular ejection fraction, by estimation, is 60 to 65%. The left ventricle has normal function. The left ventricle has no regional wall motion abnormalities. The left ventricular internal cavity size was normal in size. There is  no left ventricular hypertrophy. Left ventricular diastolic parameters are consistent with Grade II diastolic dysfunction (pseudonormalization). Elevated left ventricular end-diastolic pressure. Right Ventricle: The right ventricular size is normal. No increase in right ventricular wall thickness. Right ventricular systolic function is normal. Left Atrium: Left atrial size was normal in size. Right Atrium: Right atrial size was normal in size. Pericardium: Trivial pericardial effusion is present. The pericardial effusion is anterior to the right ventricle. Mitral Valve: The mitral valve  is abnormal. There is moderate thickening of the mitral valve leaflet(s). There is moderate calcification of the mitral valve leaflet(s). Moderate mitral annular calcification. Trivial mitral valve  regurgitation. No evidence of mitral valve stenosis. Tricuspid Valve: The tricuspid valve is normal in structure. Tricuspid valve regurgitation is not demonstrated. No evidence of tricuspid stenosis. Aortic Valve: The aortic valve is tricuspid. There is mild calcification of the aortic valve. Aortic valve regurgitation is not visualized. Aortic valve sclerosis is present, with no evidence of aortic valve stenosis. Aortic valve mean gradient measures 4.0 mmHg. Aortic valve peak gradient measures 7.2 mmHg. Aortic valve area, by VTI measures 1.73 cm. Pulmonic Valve: The pulmonic valve was normal in structure. Pulmonic valve regurgitation is not visualized. No evidence of pulmonic stenosis. Aorta: The aortic root is normal in size and structure. Venous: The inferior vena cava is normal in size with less than 50% respiratory variability, suggesting right atrial pressure of 8 mmHg. IAS/Shunts: No atrial level shunt detected by color flow Doppler.  LEFT VENTRICLE PLAX 2D LVIDd:         4.00 cm   Diastology LVIDs:         2.50 cm   LV e' medial:    4.46 cm/s LV PW:         0.80 cm   LV E/e' medial:  23.3 LV IVS:        0.90 cm   LV e' lateral:   5.22 cm/s LVOT diam:     1.70 cm   LV E/e' lateral: 19.9 LV SV:         45 LV SV Index:   33 LVOT Area:     2.27 cm  RIGHT VENTRICLE          IVC RV Basal diam:  2.10 cm  IVC diam: 1.90 cm RV Mid diam:    1.60 cm TAPSE (M-mode): 1.4 cm LEFT ATRIUM             Index        RIGHT ATRIUM          Index LA diam:        2.20 cm 1.62 cm/m   RA Area:     5.96 cm LA Vol (A2C):   19.2 ml 14.14 ml/m  RA Volume:   9.07 ml  6.68 ml/m LA Vol (A4C):   18.7 ml 13.77 ml/m LA Biplane Vol: 19.9 ml 14.66 ml/m  AORTIC VALVE                    PULMONIC VALVE AV Area (Vmax):    1.80 cm     PV Vmax:       1.11 m/s AV Area (Vmean):   1.77 cm     PV Peak grad:  4.9 mmHg AV Area (VTI):     1.73 cm AV Vmax:           134.00 cm/s AV Vmean:          89.600 cm/s AV VTI:            0.260 m AV Peak  Grad:      7.2 mmHg AV Mean Grad:      4.0 mmHg LVOT Vmax:         106.00 cm/s LVOT Vmean:        69.750 cm/s LVOT VTI:          0.198 m LVOT/AV VTI ratio: 0.76  AORTA Ao Root diam: 2.60 cm  MITRAL VALVE                TRICUSPID VALVE MV Area (PHT): 3.46 cm     TR Peak grad:   21.0 mmHg MV Decel Time: 219 msec     TR Vmax:        229.00 cm/s MV E velocity: 104.00 cm/s MV A velocity: 99.00 cm/s   SHUNTS MV E/A ratio:  1.05         Systemic VTI:  0.20 m                             Systemic Diam: 1.70 cm Jenkins Rouge MD Electronically signed by Jenkins Rouge MD Signature Date/Time: 02/01/2022/12:24:43 PM    Final    CT ANGIO HEAD NECK W WO CM  Result Date: 01/31/2022 CLINICAL DATA:  Follow-up examination for acute stroke. EXAM: CT ANGIOGRAPHY HEAD AND NECK TECHNIQUE: Multidetector CT imaging of the head and neck was performed using the standard protocol during bolus administration of intravenous contrast. Multiplanar CT image reconstructions and MIPs were obtained to evaluate the vascular anatomy. Carotid stenosis measurements (when applicable) are obtained utilizing NASCET criteria, using the distal internal carotid diameter as the denominator. RADIATION DOSE REDUCTION: This exam was performed according to the departmental dose-optimization program which includes automated exposure control, adjustment of the mA and/or kV according to patient size and/or use of iterative reconstruction technique. CONTRAST:  55m OMNIPAQUE IOHEXOL 350 MG/ML SOLN COMPARISON:  MRI from earlier the same day. FINDINGS: CT HEAD FINDINGS Brain: Age-related cerebral atrophy with chronic microvascular ischemic disease. Previously identified acute ischemic infarcts involving the posterior right frontoparietal region and occipital lobes, grossly stable. No associated hemorrhage or mass effect. No other acute large vessel territory infarct or hemorrhage. No midline shift. No hydrocephalus or extra-axial fluid collection. Vascular: No hyperdense  vessel. Calcified atherosclerosis present at the skull base. Skull: Scalp soft tissues and calvarium within normal limits. Sinuses: Air-fluid level noted within the left maxillary sinus. Mastoid air cells are clear. Orbits: Globes orbital soft tissues demonstrate no acute finding. Review of the MIP images confirms the above findings CTA NECK FINDINGS Aortic arch: Visualized aortic arch normal in caliber with standard branching pattern. Moderate to advanced atheromatous change about the arch itself. No high-grade stenosis about the origin the great vessels. Right carotid system: Right CCA patent without stenosis. Scattered calcified plaque about the right carotid bulb/proximal right ICA without significant stenosis. Occlusion versus severe stenosis at the origin of the right external carotid artery noted. Distally, there is a small focal filling defect along the posterior wall of the proximal cervical right ICA (series 11, image 160), suspicious for thrombus. Finding favored to reflect changes due to a recently ruptured plaque, although a possible small focal dissection could also be considered. No significant stenosis. Right ICA patent distally without stenosis or other abnormality. Left carotid system: Left CCA patent without stenosis. Bulky calcified plaque about the left carotid bulb/proximal left ICA without hemodynamically significant greater than 50% stenosis. Severe stenosis versus occlusion noted at the left external carotid artery. Cervical left ICA patent distally without stenosis or other acute finding. Vertebral arteries: Both vertebral arteries arise from subclavian arteries. No significant proximal subclavian artery stenosis. Right vertebral artery dominant. Vertebral arteries patent without stenosis or dissection. Skeleton: No discrete or worrisome osseous lesions. Patient is edentulous. Other neck: No other acute soft tissue abnormality within the neck. Upper chest: Hyperinflation with severe  emphysema. Visualized  upper chest demonstrates no other acute finding. Shotty subcentimeter mediastinal lymph nodes noted, which could be reactive. Review of the MIP images confirms the above findings CTA HEAD FINDINGS Anterior circulation: Petrous segments patent bilaterally. Atheromatous change seen throughout the carotid siphons with associated moderate multifocal narrowing. A1 segments, anterior communicating complex common anterior cerebral arteries widely patent. No M1 stenosis or occlusion. No proximal MCA branch occlusion. Distal MCA branches perfused and symmetric. Posterior circulation: Right vertebral artery dominant and patent to the vertebrobasilar junction without stenosis. Left vertebral artery hypoplastic. Superimposed atherosclerotic calcification about the proximal left V4 segment with associated moderate multifocal stenoses. Right PICA patent. Left PICA not well seen. Tiny fenestration noted at the vertebrobasilar junction. Basilar patent distally without stenosis. Superior cerebellar arteries patent bilaterally. Both PCAs primarily supplied via the basilar well perfused or distal aspects. Venous sinuses: Patent allowing for timing the contrast bolus. Anatomic variants: None significant.  No aneurysm. Review of the MIP images confirms the above findings IMPRESSION: CT HEAD IMPRESSION: 1. 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 No associated hemorrhage or mass effect. 2. No other new acute intracranial abnormality. 3. Underlying atrophy with chronic small vessel ischemic disease. CTA HEAD AND NECK IMPRESSION: 1. Small focal filling defect along the posterior wall of the proximal cervical right ICA, suspicious for thrombus. Finding favored to reflect changes due to a recently ruptured plaque, although a possible small focal dissection could also be considered. No significant stenosis. 2. Moderate atheromatous change about the skull  base with associated moderate multifocal narrowing about the siphons and about the proximal left V4 segment. No other hemodynamically significant or correctable stenosis. 3. Emphysema (ICD10-J43.9). Electronically Signed   By: Jeannine Boga M.D.   On: 01/31/2022 22:32   MR BRAIN WO CONTRAST  Result Date: 01/31/2022 CLINICAL DATA:  Acute neuro deficit. EXAM: MRI HEAD WITHOUT CONTRAST TECHNIQUE: Multiplanar, multiecho pulse sequences of the brain and surrounding structures were obtained without intravenous contrast. COMPARISON:  None Available. FINDINGS: Brain: Multiple areas of acute infarct are present. Small acute infarcts in the occipital lobe bilaterally. Patchy areas of acute infarct in the right parietal lobe. No associated hemorrhage. Ventricle size normal. Moderate chronic microvascular ischemic change in the white matter. No mass lesion. Vascular: Normal arterial flow voids in the skull base. Skull and upper cervical spine: No focal abnormality. Sinuses/Orbits: Mucosal edema paranasal sinuses. Left maxillary air-fluid level. Negative orbit Other: None IMPRESSION: Small acute infarcts in the occipital lobe bilaterally. Cluster of acute infarcts in the right parietal lobe. Findings suggest emboli given different vascular distributions. Moderate chronic microvascular ischemic change in the white matter. No acute hemorrhage Electronically Signed   By: Franchot Gallo M.D.   On: 01/31/2022 16:59   DG Chest Port 1 View  Result Date: 01/30/2022 CLINICAL DATA:  A 66 year old female presents with cough and dyspnea. EXAM: PORTABLE CHEST 1 VIEW COMPARISON:  February 2022. FINDINGS: EKG leads project over the chest. Signs of loss of subcutaneous fat since previous imaging, cachectic appearing on the current study. Lungs are hyperinflated. Cardiomediastinal contours and hilar structures are stable. Patchy interstitial and airspace disease with subtle nodular component in the LEFT mid and lower chest. No  visible pneumothorax. No gross signs of pleural effusion. On limited assessment there is no acute skeletal process. IMPRESSION: 1. Patchy interstitial and airspace disease with subtle nodular component in the LEFT mid and lower chest, findings are suspicious for pneumonia superimposed on pulmonary emphysema. Given nodular component would suggest  follow-up PA and lateral chest after resolution of symptoms or following therapy as appropriate. 2. Loss of subcutaneous fat compared to previous imaging is dramatic. 3. COPD. Electronically Signed   By: Zetta Bills M.D.   On: 01/30/2022 10:09      Subjective: Patient seen and examined at bedside.  Feels weak and wants to eat regular food.  Requesting to have her dietary restriction removed.  No overnight agitation, fever or vomiting reported.  Discharge Exam: Vitals:   02/05/22 0529 02/05/22 0737  BP:    Pulse:    Resp:    Temp: 97.8 F (36.6 C)   SpO2:  95%    General: Pt is alert, awake, not in acute distress.  Looks chronically ill and deconditioned.  Cachectic.  On 2 L oxygen via nasal cannula.  Looks anxious intermittently. Cardiovascular: rate controlled, S1/S2 + Respiratory: bilateral decreased breath sounds at bases with scattered crackles Abdominal: Soft, NT, ND, bowel sounds + Extremities: no edema, no cyanosis    The results of significant diagnostics from this hospitalization (including imaging, microbiology, ancillary and laboratory) are listed below for reference.     Microbiology: Recent Results (from the past 240 hour(s))  Culture, blood (Routine X 2) w Reflex to ID Panel     Status: None   Collection Time: 01/30/22 12:15 PM   Specimen: BLOOD  Result Value Ref Range Status   Specimen Description   Final    BLOOD BLOOD LEFT FOREARM Performed at [facility] [address], [place], [place] [address]    Special Requests   Final    BOTTLES DRAWN AEROBIC AND ANAEROBIC Blood Culture results may not be  optimal due to an inadequate volume of blood received in culture bottles Performed at [facility] [address], [place], [place] [address]    Culture   Final    NO GROWTH 5 DAYS Performed at [facility], [place] [address], [employer], [place] [address]    Report Status 02/04/2022 FINAL  Final  Culture, blood (Routine X 2) w Reflex to ID Panel     Status: None   Collection Time: 01/30/22 12:17 PM   Specimen: BLOOD  Result Value Ref Range Status   Specimen Description   Final    BLOOD LEFT ANTECUBITAL Performed at [facility] [address], [place], [place] [address]    Special Requests   Final    BOTTLES DRAWN AEROBIC ONLY Blood Culture adequate volume Performed at [facility] [address], [place], [place] [address]    Culture   Final    NO GROWTH 5 DAYS Performed at [facility], [place] [name] [place],  [address]    Report Status 02/04/2022 FINAL  Final  Respiratory (~20 pathogens) panel by PCR     Status: Abnormal   Collection Time: 01/30/22 12:20 PM   Specimen: Nasopharyngeal Swab; Respiratory  Result Value Ref Range Status   Adenovirus NOT DETECTED NOT DETECTED Final   Coronavirus 229E NOT DETECTED NOT DETECTED Final    Comment: (NOTE) The Coronavirus on the Respiratory Panel, DOES NOT test for the novel  Coronavirus (2019 nCoV)    Coronavirus HKU1 NOT DETECTED NOT DETECTED Final   Coronavirus NL63 NOT DETECTED NOT DETECTED Final   Coronavirus OC43 NOT DETECTED NOT DETECTED Final   Metapneumovirus NOT DETECTED NOT DETECTED Final   Rhinovirus / Enterovirus NOT DETECTED NOT DETECTED Final   Influenza A NOT DETECTED NOT  DETECTED Final   Influenza B NOT DETECTED NOT DETECTED Final   Parainfluenza Virus 1 NOT DETECTED NOT DETECTED Final   Parainfluenza Virus 2 NOT DETECTED NOT DETECTED Final   Parainfluenza Virus 3 NOT DETECTED NOT DETECTED Final   Parainfluenza Virus 4 DETECTED (A) NOT  DETECTED Final   Respiratory Syncytial Virus NOT DETECTED NOT DETECTED Final   Bordetella pertussis NOT DETECTED NOT DETECTED Final   Bordetella Parapertussis NOT DETECTED NOT DETECTED Final   Chlamydophila pneumoniae NOT DETECTED NOT DETECTED Final   Mycoplasma pneumoniae NOT DETECTED NOT DETECTED Final    Comment: Performed at [facility], [place] [address], [place], [place] [address]  MRSA Next Gen by PCR, Nasal     Status: None   Collection Time: 01/30/22 12:20 PM   Specimen: Nasopharyngeal Swab; Nasal Swab  Result Value Ref Range Status   MRSA by PCR Next Gen NOT DETECTED NOT DETECTED Final    Comment: (NOTE) The GeneXpert MRSA Assay (FDA approved for NASAL specimens only), is one component of a comprehensive MRSA colonization surveillance program. It is not intended to diagnose MRSA infection nor to guide or monitor treatment for MRSA infections. Test performance is not FDA approved in patients less than 27 years old. Performed at [facility], [place] [address] [place], [place] [address]      Labs: BNP (last 3 results) No results for input(s): "BNP" in the last 8760 hours. Basic Metabolic Panel: Recent Labs  Lab 01/30/22 0937 01/31/22 0457 02/02/22 0640  NA 147* 146* 141  K 3.5 4.0 4.8  CL 115* 103 103  CO2 26 34* 36*  GLUCOSE 112* 143* 82  BUN 18 25* 30*  CREATININE 0.46 0.66 0.52  CALCIUM 6.7* 9.6 9.1  MG  --   --  2.4  PHOS  --   --  2.9   Liver Function Tests: Recent Labs  Lab 01/30/22 0937 01/31/22 0457  AST 28 29  ALT 12 24  ALKPHOS 39 58  BILITOT 1.0 0.7  PROT 5.2* 7.7  ALBUMIN 2.2* 3.2*   No results for input(s): "LIPASE", "AMYLASE" in the last 168 hours. No results for input(s): "AMMONIA" in the last 168 hours. CBC: Recent Labs  Lab 01/30/22 0937 01/31/22 0457 02/02/22 0640  WBC 10.8* 12.7* 11.5*  NEUTROABS  --   --  10.1*  HGB 14.1 14.2 13.1  HCT 45.7 45.3 42.0  MCV 97.9 97.0 97.2  PLT 176 174 169   Cardiac  Enzymes: No results for input(s): "CKTOTAL", "CKMB", "CKMBINDEX", "TROPONINI" in the last 168 hours. BNP: Invalid input(s): "POCBNP" CBG: No results for input(s): "GLUCAP" in the last 168 hours. D-Dimer No results for input(s): "DDIMER" in the last 72 hours. Hgb A1c No results for input(s): "HGBA1C" in the last 72 hours. Lipid Profile No results for input(s): "CHOL", "HDL", "LDLCALC", "TRIG", "CHOLHDL", "LDLDIRECT" in the last 72 hours. Thyroid function studies No results for input(s): "TSH", "T4TOTAL", "T3FREE", "THYROIDAB" in the last 72 hours.  Invalid input(s): "FREET3" Anemia work up No results for input(s): "VITAMINB12", "FOLATE", "FERRITIN", "TIBC", "IRON", "RETICCTPCT" in the last 72 hours. Urinalysis    Component Value Date/Time   COLORURINE YELLOW 09/12/2020 2230   APPEARANCEUR Clear 04/13/2021 1207   LABSPEC 1.028 09/12/2020 2230   PHURINE 5.0 09/12/2020 2230   GLUCOSEU Negative 04/13/2021 1207   HGBUR NEGATIVE 09/12/2020 2230   BILIRUBINUR Negative 04/13/2021 1207   KETONESUR negative 03/01/2021 0920   KETONESUR 20 (A) 09/12/2020 2230   PROTEINUR  Negative 04/13/2021 1207   PROTEINUR 30 (A) 09/12/2020 2230   UROBILINOGEN 0.2 03/01/2021 0920   NITRITE Negative 04/13/2021 1207   NITRITE NEGATIVE 09/12/2020 2230   LEUKOCYTESUR Negative 04/13/2021 1207   LEUKOCYTESUR NEGATIVE 09/12/2020 2230   Sepsis Labs Recent Labs  Lab 01/30/22 0937 01/31/22 0457 02/02/22 0640  WBC 10.8* 12.7* 11.5*   Microbiology Recent Results (from the past 240 hour(s))  Culture, blood (Routine X 2) w Reflex to ID Panel     Status: None   Collection Time: 01/30/22 12:15 PM   Specimen: BLOOD  Result Value Ref Range Status   Specimen Description   Final    BLOOD BLOOD LEFT FOREARM Performed at [facility], [place] [address], [place], [place] [address]    Special Requests   Final    BOTTLES DRAWN AEROBIC AND ANAEROBIC Blood Culture results may not be optimal due to  an inadequate volume of blood received in culture bottles Performed at [facility] [address], [place], [place] [address]    Culture   Final    NO GROWTH 5 DAYS Performed at [facility], [place] [address] [place], [place] [address]    Report Status 02/04/2022 FINAL  Final  Culture, blood (Routine X 2) w Reflex to ID Panel     Status: None   Collection Time: 01/30/22 12:17 PM   Specimen: BLOOD  Result Value Ref Range Status   Specimen Description   Final    BLOOD LEFT ANTECUBITAL Performed at [facility] [address], [place], [place] [address]    Special Requests   Final    BOTTLES DRAWN AEROBIC ONLY Blood Culture adequate volume Performed at [facility] [address] [place], [place] [address]    Culture   Final    NO GROWTH 5 DAYS Performed at [facility], [place] [address] [place], [place] [address]    Report Status 02/04/2022 FINAL  Final  Respiratory (~20 pathogens) panel by PCR     Status: Abnormal   Collection Time: 01/30/22 12:20 PM   Specimen: Nasopharyngeal Swab; Respiratory  Result Value Ref Range Status   Adenovirus NOT DETECTED NOT DETECTED Final   Coronavirus 229E NOT DETECTED NOT DETECTED Final    Comment: (NOTE) The Coronavirus on the Respiratory Panel, DOES NOT test for the novel  Coronavirus (2019 nCoV)    Coronavirus HKU1 NOT DETECTED NOT DETECTED Final   Coronavirus NL63 NOT DETECTED NOT DETECTED Final   Coronavirus OC43 NOT DETECTED NOT DETECTED Final   Metapneumovirus NOT DETECTED NOT DETECTED Final   Rhinovirus / Enterovirus NOT DETECTED NOT DETECTED Final   Influenza A NOT DETECTED NOT DETECTED Final   Influenza B NOT DETECTED NOT DETECTED Final   Parainfluenza Virus 1 NOT DETECTED NOT DETECTED Final   Parainfluenza Virus 2 NOT DETECTED NOT DETECTED Final   Parainfluenza Virus 3 NOT DETECTED NOT DETECTED Final   Parainfluenza Virus 4 DETECTED (A) NOT DETECTED Final    Respiratory Syncytial Virus NOT DETECTED NOT DETECTED Final   Bordetella pertussis NOT DETECTED NOT DETECTED Final   Bordetella Parapertussis NOT DETECTED NOT DETECTED Final   Chlamydophila pneumoniae NOT DETECTED NOT DETECTED Final   Mycoplasma pneumoniae NOT DETECTED NOT DETECTED Final    Comment: Performed at [facility], [place] [address], [place], [place] [address]  MRSA Next Gen by PCR, Nasal     Status: None   Collection Time: 01/30/22 12:20 PM  Specimen: Nasopharyngeal Swab; Nasal Swab  Result Value Ref Range Status   MRSA by PCR Next Gen NOT DETECTED NOT DETECTED Final    Comment: (NOTE) The GeneXpert MRSA Assay (FDA approved for NASAL specimens only), is one component of a comprehensive MRSA colonization surveillance program. It is not intended to diagnose MRSA infection nor to guide or monitor treatment for MRSA infections. Test performance is not FDA approved in patients less than 9 years old. Performed at [facility], [place] [address] [place], [place] [address]      Time coordinating discharge: 35 minutes  SIGNED:   Aline August, MD  [facility] 02/05/2022, 9:51 AM

## 2022-02-05 NOTE — Progress Notes (Signed)
Brief Neuro Update:   Briefly, Ms. Raven Torres is a [phone] with supraglottic ca s/p chemo + rad in 2022, p/w SOB + cough, developed L hand weakness inpatient and found to have BL embolic appearing infarcts. Strokes were initially felt by our team to be secondary to hypercoag state from cancer. Per oncology notes, she is without any evidence of cancer at this time. Cardiology declined TEE citing poor candidacy. 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 - No indication for Anticoagulation at this time. Would recommend dual antiplatelet with Aspirin '81mg'$  daily along with plavix '75mg'$  daily x 21 days, followed by Aspirin '81mg'$  daily alone. - CT Chest, abdomen and pelvis for occult malignancy screen - Repeat limited TTE Bubble study to evaluate for PFO. - BL Lower ext venous dopplers to evaluate for DVTs. - Recommend outpatient cardiac monitor. Outpatient amb referral to cardiology ordered and card master notified over secure chat. - follow up with stroke clinic outpatient. - further workup based on above.  [place] Pager Number [phone]

## 2022-02-05 NOTE — Progress Notes (Signed)
OT Cancellation Note  Patient Details Name: Raven Torres MRN: [id number] DOB: 07-05-1956   Cancelled Treatment:    Reason Eval/Treat Not Completed: Other (comment) Patient is in process of drinking contrast at this time. Patient requested that therapy hold off at this time. OT to continue to follow and check back as schedule will allow.  Jackelyn Poling OTR/L, [facility] Office# [phone] Pager# [phone]  02/05/2022, 12:25 PM

## 2022-02-05 NOTE — Progress Notes (Addendum)
Palliative:  HPI: 66 y.o. 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 Admitted with acute COPD exacerbation. Noted cachexia with significant weight loss and previously declined feeding tube. MRI with evidence of multiple small acute infarcts. Poor prognosis with overall failure to thrive.    Patient is drinking contrast for preparation of CT. She gives permission for me to speak with her sister, Raven Torres. I was able to spoke with sister, Raven Torres. I reviewed with Raven Torres my conversations with her sister and Raven Torres's stated wishes and desires she shared with me including DNR and NO feeding tube. Raven Torres confirms that her sister is consistent with her wishes expressed to her. Raven Torres expresses concern for her sister's mentation and she reports that she is usually much more calm and easier to follow - we question steroids vs stroke effecting her responses. Raven Torres does share that her sister has been somewhat forgetful even prior to admission and need encouraging and assistance. Raven Torres would like a more comprehensive mental assessment in the near future.   Raven Torres and I have a long discussion about Raven Torres's poor prognosis and path forward. Plans for SNF rehab and Raven Torres is trying to help her find housing. We discussed ideal placement would likely be ALF. I encouraged Raven Torres to work with SNF rehab as well as Medicaid/DSS to see if she can transition to ALF. Unless she does not improve in SNF rehab she may need higher level of care. Best case scenario ALF would be appropriate given her frailty and risk of fall, stroke, aspiration. We also discussed hospice as an option to provide additional support. I do believe Raven Torres would qualify for hospice services (not hospice facility currently). I have shared this  with [name] as well. Raven understands. Raven Torres shares that she just wants to support her sister so that she can live out whatever time she has left in a good and healthy environment where she can be happy and have the care she needs.   All questions/concerns addressed. Emotional support provided.   Exam: Alert. Cachectic, frail. No distress.   Plan: - DNR - NO feeding tube - Outpatient palliative to follow at SNF rehab - Consideration of hospice in the near future - Needs improved living situation after SNF - ALF would be ideal  60 min  Raven Sill, NP Palliative Medicine Team Pager [phone] (Please see [email or web address] for schedule) Team Phone [phone]    Greater than 50%  of this time was spent counseling and coordinating care related to the above assessment and plan

## 2022-02-05 NOTE — Progress Notes (Signed)
Chaplain went to check on the status of Raven Torres's HCPOA paperwork.  She is preparing for a CT scan and may not be available once we gather witnesses and notary.  Chaplain will follow up and try to get paperwork notarized tomorrow.  [address] [place] Pager, [phone]

## 2022-02-05 NOTE — TOC Progression Note (Addendum)
Transition of Care [facility]) - Progression Note    Patient Details  Name: Raven Torres MRN: [id number] Date of Birth: 24-Sep-1955  Transition [facility] CM/SW [place], [place] Phone Number: 02/05/2022, 10:20 AM  Clinical Narrative:    Pt accepted to [facility] and has insurance approval from 02/04/2022 to 02/07/2022. Pt to discharge once cleared by neurology.    Update 1500: CSW spoke with pt's sister, Tiffeny Minchew and discussed recommendation for palliative care to continue to follow this pt on an outpatient basis. Pt's family have chosen [facility] for palliative care services. Referral has been made for palliative care.    Expected Discharge Plan: [place] Barriers to Discharge: Continued Medical Work up  Expected Discharge Plan and Services Expected Discharge Plan: [place] In-house Referral: Clinical Social Work Discharge Planning Services: CM Consult Post Acute Care Choice: [place] Living arrangements for the past 2 months: Single Family Home Expected Discharge Date: 02/05/22                                     Social Determinants of Health (SDOH) Interventions    Readmission Risk Interventions    02/05/2022   10:01 AM  Readmission Risk Prevention Plan  Post Dischage Appt Complete  Medication Screening Complete  Transportation Screening Complete

## 2022-02-06 ENCOUNTER — Inpatient Hospital Stay (HOSPITAL_COMMUNITY): Payer: Medicare Other

## 2022-02-06 ENCOUNTER — Telehealth: Payer: Self-pay | Admitting: Hematology and Oncology

## 2022-02-06 DIAGNOSIS — R918 Other nonspecific abnormal finding of lung field: Secondary | ICD-10-CM | POA: Diagnosis present

## 2022-02-06 DIAGNOSIS — B348 Other viral infections of unspecified site: Secondary | ICD-10-CM | POA: Diagnosis present

## 2022-02-06 DIAGNOSIS — C321 Malignant neoplasm of supraglottis: Secondary | ICD-10-CM | POA: Diagnosis not present

## 2022-02-06 DIAGNOSIS — J189 Pneumonia, unspecified organism: Secondary | ICD-10-CM | POA: Diagnosis not present

## 2022-02-06 DIAGNOSIS — R52 Pain, unspecified: Secondary | ICD-10-CM

## 2022-02-06 DIAGNOSIS — J9601 Acute respiratory failure with hypoxia: Secondary | ICD-10-CM | POA: Diagnosis not present

## 2022-02-06 LAB — CREATININE, SERUM
Creatinine, Ser: 0.49 mg/dL (ref 0.44–1.00)
GFR, Estimated: >60 mL/min (ref >60)

## 2022-02-06 NOTE — Progress Notes (Signed)
[facility]) Hospital Liaison note:  Notified by Normajean Baxter of request for [facility] Palliative Care services. Will continue to follow for disposition.  Please call with any outpatient palliative questions or concerns.  Thank you for the opportunity to participate in this patient's care.  Thank you, Lorelee Market, LPN [facility] Liaison [phone]

## 2022-02-06 NOTE — Progress Notes (Signed)
Chaplain met with Raven Torres and her daughter, Raven Torres, and also spoke with [name], her sister by phone.  Chaplain assisted [name] in getting her HCPOA notarized.  Copies were made and instructions were given to [name].  A copy was also placed in her paper chart and scanned and sent to [email or web address].  [address] [place] Pager, [phone]

## 2022-02-06 NOTE — Progress Notes (Signed)
Brief Neuro update:  Additional workup ordered yesterday is mostly completed. CT Chest with a 24m spiculated nodule in Right lower lobe that primary team is looking into. TTE with a small PFO with preliminary doppler read negative to PFO. Full read is pending.  If the noted nodule is not a malignancy, then would still recommend Aspirin '81mg'$  daily along with plavix '75mg'$  daily x 21 days, followed by Aspirin '81mg'$  daily alone for cryptogenic stroke. Outpatient loop recorder and cards master notified. Also will review the full read on lower ext dopplers.  [place]ager Number [phone]

## 2022-02-06 NOTE — Telephone Encounter (Signed)
Scheduled appointment per provider. Left message with appointment details.  

## 2022-02-06 NOTE — Progress Notes (Signed)
Occupational Therapy Treatment Patient Details Name: Raven Torres MRN: [id number] DOB: 1956-07-30 Today's Date: 02/06/2022   History of present illness 66 y.o. female with medical history of supraglottic cancer status postradiation, smoker, COPD, hypertension, high cholesterol here for worsening breathing over the past 6 days. Dx of PNA. Found to have wrist drop on 6/29 and MRi positive for CVA.   OT comments  Patient back in bed and s/p breathing treatment. At the beginning of therapist treatment - family walked in with soup for the patient which impaired patient's attention to task. Therapist had patient work on wrist extension and attempting to activate fingers with different positioning. She is grossly able to control wrist and forearm movement with some stabilization for abnormal muscle recruitment. Trace finger flexion noted but no functional movement. Patient exhibits abnormal muscle recruitment of elbow flexors when attempting to use hand. Therapist educated patient on neuromuscular re-education principles and to NOT allow elbow or other extraneous movement when attempting to work her wrist and fingers. Patient almost immediately reports getting frustrated when working with her hand. Therapist again educated patient on the need to work on hand movement - despite frustration. Limited treatment today. Cont POC and will follow.   Recommendations for follow up therapy are one component of a multi-disciplinary discharge planning process, led by the attending physician.  Recommendations may be updated based on patient status, additional functional criteria and insurance authorization.    Follow Up Recommendations  Skilled nursing-short term rehab (<3 hours/day)    Assistance Recommended at Discharge Frequent or constant Supervision/Assistance  Patient can return home with the following  A little help with walking and/or transfers;A little help with bathing/dressing/bathroom;Assistance with  cooking/housework;Help with stairs or ramp for entrance;Direct supervision/assist for medications management;Direct supervision/assist for financial management   Equipment Recommendations   (defer)    Recommendations for Other Services      Precautions / Restrictions Precautions Precautions: Other (comment);Fall Precaution Comments: monitor o2 sat Restrictions Weight Bearing Restrictions: No       Mobility Bed Mobility                    Transfers                         Balance                                           ADL either performed or assessed with clinical judgement   ADL                                              Extremity/Trunk Assessment Upper Extremity Assessment LUE Deficits / Details: Grossly functional shoulder and elbow ROM, more consistent ability to flex/extend wrist, trace finger movement - abnormal muscle recruitment with attempts to use hand LUE Sensation: WNL LUE Coordination: decreased gross motor;decreased fine motor            Vision Baseline Vision/History: 1 Wears glasses Vision Assessment?: No apparent visual deficits   Perception     Praxis      Cognition Arousal/Alertness: Awake/alert Behavior During Therapy: Anxious Overall Cognitive Status: Within Functional Limits for tasks assessed  Exercises Other Exercises Other Exercises: wrist extension x 20 with therapist stabilizing forearm (keeping it from supination), able to supinate and pronate, with attempts at finger movement therapist positioned and stabilized in multiple ways but with trace flexsion noted in [place].    Shoulder Instructions       General Comments      Pertinent Vitals/ Pain       Pain Assessment Pain Assessment: No/denies pain  Home Living                                          Prior Functioning/Environment               Frequency  Min 2X/week        Progress Toward Goals  OT Goals(current goals can now be found in the care plan section)  Progress towards OT goals: OT to reassess next treatment  Acute Rehab OT Goals Patient Stated Goal: to improve left hand OT Goal Formulation: With patient Time For Goal Achievement: 02/15/22 Potential to Achieve Goals: Good  Plan Discharge plan remains appropriate    Co-evaluation                 AM-PAC OT "6 Clicks" Daily Activity     Outcome Measure   Help from another person eating meals?: A Little Help from another person taking care of personal grooming?: A Little Help from another person toileting, which includes using toliet, bedpan, or urinal?: A Little Help from another person bathing (including washing, rinsing, drying)?: A Little Help from another person to put on and taking off regular upper body clothing?: A Little Help from another person to put on and taking off regular lower body clothing?: A Little 6 Click Score: 18    End of Session Equipment Utilized During Treatment: Oxygen  OT Visit Diagnosis: Hemiplegia and hemiparesis Hemiplegia - Right/Left: Left Hemiplegia - dominant/non-dominant: Dominant Hemiplegia - caused by: Cerebral infarction   Activity Tolerance Patient tolerated treatment well   Patient Left in bed;with call bell/phone within reach;with bed alarm set;with family/visitor present   Nurse Communication Mobility status        Time: 2500-3704 OT Time Calculation (min): 14 min  Charges: OT General Charges $OT Visit: 1 Visit OT Treatments $Neuromuscular Re-education: 8-22 mins  Derl Barrow, OTR/L [place]  Office [phone] Pager: [place] 02/06/2022, 4:00 PM

## 2022-02-06 NOTE — Progress Notes (Signed)
PROGRESS NOTE    Raven Torres  YJE:[id number] DOB: 04-20-1956 DOA: 01/30/2022 PCP: Ladell Pier, MD     Brief Narrative:  66 year old WF PMHx supraglottic cancer s/p radiation, tobacco abuse, COPD, essential HTN, HLD.  Presented to the ER with about 6 days of worsening shortness of breath, coughing up more sputum and also subjective fever at home.  Felt very weak.  She does have chronic aspiration but currently on regular diet.  Used to have PEG tube but none now. In the emergency room, reportedly she was brought on nonrebreather.  WBC count normal.  Chest x-ray showed advanced COPD changes along with left middle lobe infiltrate.  Admitted with aspiration pneumonia and treated with antibiotics and oxygen.  Does not use oxygen at home.   On 01/31/2022, patient was found to have weakness of the left wrist which had started more than 12 hours ago.  She was found to have - as below.  Neurology was consulted.   Subjective: A/O x4, afebrile overnight   Assessment & Plan: Covid vaccination;   Principal Problem:   Pneumonia Active Problems:   Tobacco abuse   Malignant neoplasm of supraglottis (HCC)   Protein-calorie malnutrition, severe   Pressure injury of skin   Acute ischemic stroke (HCC)   Acute respiratory failure with hypoxia (HCC)   Multiple lung nodules on CT   Infection due to parainfluenza virus 4  Acute RIGHT parietal ischemic infarct -Likely embolic secondary to hypercoagulability -Patient was found to have weakness of the left wrist on 01/31/2022 which had started more than 12 hours ago.  Case was discussed with neurology.  Patient was out of the window for any intervention.  -Neurology recommended to complete stroke work-up at [facility] and recommended against transfer.   -She was found to have infarct  -Currently on aspirin 81 mg daily. -CTA head/neck: Extensive atheromatous changes multifocal narrowing, suspected small filling defect along the  posterior wall of the proximal cervical right ICA suspicious for thrombus. -LDL 91.   -Lipitor 40 mg daily - 7/1 hemoglobin A1c= 5.0 -Echo showed EF of 60 to 65% with grade 2 diastolic dysfunction.  Patient is not a good candidate for TEE as per cardiology input and hence TEE has been canceled -Pulmonary vascular team;  Recommend aspirin for now and start full dose Lovenox or Eliquis from February 06, 2022 onwards after discussion with oncology.  -ontinue Lipitor.   -7/5 discussed case with Neurology, dependent upon findings of BLE Doppler patient will be started on full dose anticoagulation vs DAPT  -PT/OT following.  Recommend SNF.  TOC consult.   Acute respiratory failure with hypoxia/CAP/COPD exacerbation/positive parainfluenza virus 4 -Left sided community-acquired pneumonia -Prednisone 40 mg daily - Completed 5-day course antibiotics - Flutter valve - Incentive spirometry - DuoNeb QID  Multiple lung nodules/RLL lung nodule - Patient has multiple lung nodules which in this high risk patient would meet criteria for suspicion of malignancy.  Especially RLL 1.2 cm spiculated nodule - Given patient's severe emphysema, and other core morbidities would not recommend even attempting to biopsy. - 7/5 discuss with PCCM in A.m.  Acute Diastolic CHF - 6/37 echocardiogram consistent with acute diastolic CHF see results below - Strict in and out - Daily weight - Given patient's current BP and debilitated state do not believe patient will tolerate normal CHF medication.  Subglottic malignancy s/p XRT  -Chronic dysphagia and aspiration -Cancer related cachexia/severe protein calorie malnutrition -Generalized conditioning/debility -Diet as per SLP recommendations.  Overall prognosis  is guarded to poor.  Nutrition following. -Palliative care following.  Patient will need outpatient palliative care follow-up.  Patient interested in SNF placement.  TOC following. -Throat cancer treated with  chemoradiation: Currently not on active treatment   Hypernatremia -Resolved  Leukocytosis -Patient currently on steroids, negative bands, negative left shift, negative fever.  Will monitor closely   Right/left/medial coccygeal stage I pressure injury:  -Present on admission -Agree with documentation as below.  Continue local wound care. Pressure Injury 01/30/22 Coccyx Right;Left;Medial Stage 1 -  Intact skin with non-blanchable redness of a localized area usually over a bony prominence. Red, pink (Active)  01/30/22 1340  Location: Coccyx  Location Orientation: Right;Left;Medial  Staging: Stage 1 -  Intact skin with non-blanchable redness of a localized area usually over a bony prominence.  Wound Description (Comments): Red, pink  Present on Admission: Yes  Dressing Type Foam - Lift dressing to assess site every shift 02/05/22 2120   Severe protein calorie malnutrition - Patient eating well, sister to continue to encourage patient to eat     Mobility Assessment (last 72 hours)     Mobility Assessment     Row Name 02/06/22 1715 02/06/22 1558 02/06/22 1121 02/04/22 1211 02/04/22 0830   Does patient have an order for bedrest or is patient medically unstable No - Continue assessment -- -- -- No - Continue assessment   What is the highest level of mobility based on the progressive mobility assessment? Level 5 (Walks with assist in room/hall) - Balance while stepping forward/back and can walk in room with assist - Complete Level 5 (Walks with assist in room/hall) - Balance while stepping forward/back and can walk in room with assist - Complete Level 5 (Walks with assist in room/hall) - Balance while stepping forward/back and can walk in room with assist - Complete Level 5 (Walks with assist in room/hall) - Balance while stepping forward/back and can walk in room with assist - Complete Level 4 (Walks with assist in room) - Balance while marching in place and cannot step forward and back -  Complete   Is the above level different from baseline mobility prior to current illness? Yes - Recommend PT order -- -- -- Yes - Recommend PT order                 DVT prophylaxis: Lovenox Code Status: DNR Family Communication: 7/5 sister at bedside for discussion of plan of care all questions answered Status is: Inpatient    Dispo: The patient is from: Home              Anticipated d/c is to: SNF              Anticipated d/c date is: 1 day              Patient currently is not medically stable to d/c.      Consultants:  Oncology Neurology   Procedures/Significant Events:  6/30 Echocardiogram Left Ventricle: Left ventricular ejection fraction, by estimation, is 60  to 65%. The left ventricle has normal function. The left ventricle has no  regional wall motion abnormalities. The left ventricular internal cavity  size was normal in size. There is   no left ventricular hypertrophy. Left ventricular diastolic parameters  are consistent with Grade II diastolic dysfunction (pseudonormalization).  Elevated left ventricular end-diastolic pressure.   Right Ventricle: The right ventricular size is normal. No increase in  right ventricular wall thickness. Right ventricular systolic function is  normal.  Left Atrium: Left atrial size was normal in size.   Right Atrium: Right atrial size was normal in size.   Pericardium: Trivial pericardial effusion is present. The pericardial  effusion is anterior to the right ventricle.   Mitral Valve: The mitral valve is abnormal. There is moderate thickening  of the mitral valve leaflet(s). There is moderate calcification of the  mitral valve leaflet(s). Moderate mitral annular calcification. Trivial  mitral valve regurgitation. No  evidence of mitral valve stenosis.   Tricuspid Valve: The tricuspid valve is normal in structure. Tricuspid  valve regurgitation is not demonstrated. No evidence of tricuspid  stenosis.   Aortic  Valve: The aortic valve is tricuspid. There is mild calcification  of the aortic valve. Aortic valve regurgitation is not visualized. Aortic  valve sclerosis is present, with no evidence of aortic valve stenosis.  Aortic valve mean gradient measures  4.0 mmHg. Aortic valve peak gradient measures 7.2 mmHg. Aortic valve area,  by VTI measures 1.73 cm.   Pulmonic Valve: The pulmonic valve was normal in structure. Pulmonic valve  regurgitation is not visualized. No evidence of pulmonic stenosis.   Aorta: The aortic root is normal in size and structure.   Venous: The inferior vena cava is normal in size with less than 50%  respiratory variability, suggesting right atrial pressure of 8 mmHg.   IAS/Shunts: No atrial level shunt detected by color flow Doppler.   7/4 CT chest abdomen pelvis W contrast: IMPRESSION: 1. 12 mm irregular spiculated right lower lobe pulmonary nodule. While this may be infectious/inflammatory, primary bronchogenic neoplasm could certainly have this appearance. Close follow-up recommended. 2. Peripheral patchy and nodular airspace disease in the left lower lobe is probably infectious/inflammatory with atypical etiology a distinct consideration. Consider follow-up CT chest in 3 months after therapy to evaluate for resolution. 12 mm right lower lobe pulmonary nodule could be reassessed at that time as well. 3. No evidence for metastatic disease in the abdomen or pelvis. 4. 3 mm nonobstructing right renal stone. 5. Aortic Atherosclerosis (ICD10-I70.0) and Emphysema (ICD10-J43.9).   I have personally reviewed and interpreted all radiology studies and my findings are as above.  VENTILATOR SETTINGS: Nasal cannula 7/5 Flow 2 L/min SPO2 99%   Cultures 6/28 respiratory virus panel positive parainfluenza virus 4    Antimicrobials: Anti-infectives (From admission, onward)    Start     Ordered Stop   01/31/22 1000  cefTRIAXone (ROCEPHIN) 1 g in sodium chloride  0.9 % 100 mL IVPB  Status:  Discontinued        01/30/22 1300 02/03/22 1536   01/30/22 1030  cefTRIAXone (ROCEPHIN) 1 g in sodium chloride 0.9 % 100 mL IVPB        01/30/22 1017 01/30/22 1109   01/30/22 1030  azithromycin (ZITHROMAX) 500 mg in sodium chloride 0.9 % 250 mL IVPB  Status:  Discontinued        01/30/22 1017 02/03/22 1536         Devices    LINES / TUBES:      Continuous Infusions:   Objective: Vitals:   02/06/22 0718 02/06/22 0719 02/06/22 0720 02/06/22 1418  BP:      Pulse:      Resp:      Temp:      TempSrc:      SpO2: 99% 99% 99% 99%  Weight:      Height:        Intake/Output Summary (Last 24 hours) at 02/06/2022 1758 Last data filed  at 02/05/2022 2200 Gross per 24 hour  Intake 600 ml  Output 300 ml  Net 300 ml   Filed Weights   01/30/22 1326 01/30/22 1511  Weight: 38.4 kg 38.4 kg    Examination:  General: A/O x4, positive acute respiratory distress, cachectic Eyes: negative scleral hemorrhage, negative anisocoria, negative icterus ENT: Negative Runny nose, negative gingival bleeding, Neck:  Negative scars, masses, torticollis, lymphadenopathy, JVD Lungs: tachypneic, decreased air movement bilaterally, positive bilateral rhonchi, positive expiratory wheezes.  Cardiovascular: Regular rate and rhythm without murmur gallop or rub normal S1 and S2 Abdomen: negative abdominal pain, nondistended, positive soft, bowel sounds, no rebound, no ascites, no appreciable mass Extremities: No significant cyanosis, clubbing, or edema bilateral lower extremities Skin: Negative rashes, lesions, ulcers Psychiatric:  Negative depression, negative anxiety, negative fatigue, negative mania  Central nervous system:  Cranial nerves II through XII intact, tongue/uvula midline, all extremities muscle strength 5/5, sensation intact throughout, negative dysarthria, negative expressive aphasia, negative receptive aphasia.  .     Data Reviewed: Care during the described  time interval was provided by me .  I have reviewed this patient's available data, including medical history, events of note, physical examination, and all test results as part of my evaluation.  CBC: Recent Labs  Lab 01/31/22 0457 02/02/22 0640  WBC 12.7* 11.5*  NEUTROABS  --  10.1*  HGB 14.2 13.1  HCT 45.3 42.0  MCV 97.0 97.2  PLT 174 867   Basic Metabolic Panel: Recent Labs  Lab 01/31/22 0457 02/02/22 0640 02/06/22 0511  NA 146* 141  --   K 4.0 4.8  --   CL 103 103  --   CO2 34* 36*  --   GLUCOSE 143* 82  --   BUN 25* 30*  --   CREATININE 0.66 0.52 0.49  CALCIUM 9.6 9.1  --   MG  --  2.4  --   PHOS  --  2.9  --    GFR: Estimated Creatinine Clearance: 40.8 mL/min (by C-G formula based on SCr of 0.49 mg/dL). Liver Function Tests: Recent Labs  Lab 01/31/22 0457  AST 29  ALT 24  ALKPHOS 58  BILITOT 0.7  PROT 7.7  ALBUMIN 3.2*   No results for input(s): "LIPASE", "AMYLASE" in the last 168 hours. No results for input(s): "AMMONIA" in the last 168 hours. Coagulation Profile: No results for input(s): "INR", "PROTIME" in the last 168 hours. Cardiac Enzymes: No results for input(s): "CKTOTAL", "CKMB", "CKMBINDEX", "TROPONINI" in the last 168 hours. BNP (last 3 results) No results for input(s): "PROBNP" in the last 8760 hours. HbA1C: No results for input(s): "HGBA1C" in the last 72 hours. CBG: No results for input(s): "GLUCAP" in the last 168 hours. Lipid Profile: No results for input(s): "CHOL", "HDL", "LDLCALC", "TRIG", "CHOLHDL", "LDLDIRECT" in the last 72 hours. Thyroid Function Tests: No results for input(s): "TSH", "T4TOTAL", "FREET4", "T3FREE", "THYROIDAB" in the last 72 hours. Anemia Panel: No results for input(s): "VITAMINB12", "FOLATE", "FERRITIN", "TIBC", "IRON", "RETICCTPCT" in the last 72 hours. Sepsis Labs: Recent Labs  Lab 01/31/22 0457 02/01/22 0510  PROCALCITON 0.13 <0.10    Recent Results (from the past 240 hour(s))  Culture, blood  (Routine X 2) w Reflex to ID Panel     Status: None   Collection Time: 01/30/22 12:15 PM   Specimen: BLOOD  Result Value Ref Range Status   Specimen Description   Final    BLOOD BLOOD LEFT FOREARM Performed at [facility]  [address] [place], [place] [address]    Special Requests   Final    BOTTLES DRAWN AEROBIC AND ANAEROBIC Blood Culture results may not be optimal due to an inadequate volume of blood received in culture bottles Performed at [facility] [address], [place], [place] [address]    Culture   Final    NO GROWTH 5 DAYS Performed at [facility], [place] [address] [place], [place] [address]    Report Status 02/04/2022 FINAL  Final  Culture, blood (Routine X 2) w Reflex to ID Panel     Status: None   Collection Time: 01/30/22 12:17 PM   Specimen: BLOOD  Result Value Ref Range Status   Specimen Description   Final    BLOOD LEFT ANTECUBITAL Performed at [facility] [name] [place], [place] [address]    Special Requests   Final    BOTTLES DRAWN AEROBIC ONLY Blood Culture adequate volume Performed at [facility] [address] [place], [place] [address]    Culture   Final    NO GROWTH 5 DAYS Performed at [facility], [place] [address] [place], [place] [address]    Report Status 02/04/2022 FINAL  Final  Respiratory (~20 pathogens) panel by PCR     Status: Abnormal   Collection Time: 01/30/22 12:20 PM   Specimen: Nasopharyngeal Swab; Respiratory  Result Value Ref Range Status   Adenovirus NOT DETECTED NOT DETECTED Final   Coronavirus 229E NOT DETECTED NOT DETECTED Final    Comment: (NOTE) The Coronavirus on the Respiratory Panel, DOES NOT test for the novel  Coronavirus (2019 nCoV)    Coronavirus HKU1 NOT DETECTED NOT DETECTED Final   Coronavirus NL63 NOT DETECTED NOT DETECTED Final   Coronavirus OC43 NOT DETECTED NOT DETECTED Final   Metapneumovirus NOT  DETECTED NOT DETECTED Final   Rhinovirus / Enterovirus NOT DETECTED NOT DETECTED Final   Influenza A NOT DETECTED NOT DETECTED Final   Influenza B NOT DETECTED NOT DETECTED Final   Parainfluenza Virus 1 NOT DETECTED NOT DETECTED Final   Parainfluenza Virus 2 NOT DETECTED NOT DETECTED Final   Parainfluenza Virus 3 NOT DETECTED NOT DETECTED Final   Parainfluenza Virus 4 DETECTED (A) NOT DETECTED Final   Respiratory Syncytial Virus NOT DETECTED NOT DETECTED Final   Bordetella pertussis NOT DETECTED NOT DETECTED Final   Bordetella Parapertussis NOT DETECTED NOT DETECTED Final   Chlamydophila pneumoniae NOT DETECTED NOT DETECTED Final   Mycoplasma pneumoniae NOT DETECTED NOT DETECTED Final    Comment: Performed at [facility], [place] [address], [place], [place] [address]  MRSA Next Gen by PCR, Nasal     Status: None   Collection Time: 01/30/22 12:20 PM   Specimen: Nasopharyngeal Swab; Nasal Swab  Result Value Ref Range Status   MRSA by PCR Next Gen NOT DETECTED NOT DETECTED Final    Comment: (NOTE) The GeneXpert MRSA Assay (FDA approved for NASAL specimens only), is one component of a comprehensive MRSA colonization surveillance program. It is not intended to diagnose MRSA infection nor to guide or monitor treatment for MRSA infections. Test performance is not FDA approved in patients less than 65 years old. Performed at [facility], [place] [address], [place], [place] [address]          Radiology Studies: VAS [place] LOWER EXTREMITY VENOUS (DVT)  Result Date: 02/06/2022  Lower Venous DVT Study Patient Name:  ANDROMEDA POPPEN [facility]  Date  of Exam:   02/06/2022 Medical Rec #: [id number]         Accession #:    [id number] Date of Birth: 1955-12-01         Patient Gender: F Patient Age:   66 years Exam Location:  [facility] Procedure:      VAS [place] LOWER EXTREMITY VENOUS (DVT) Referring Phys: Donnetta Simpers  --------------------------------------------------------------------------------  Indications: Pain - per MD order (no pain per patient).  Comparison Study: No previous exams Performing Technologist: Jody Hill RVT, RDMS  Examination Guidelines: A complete evaluation includes B-mode imaging, spectral Doppler, color Doppler, and power Doppler as needed of all accessible portions of each vessel. Bilateral testing is considered an integral part of a complete examination. Limited examinations for reoccurring indications may be performed as noted. The reflux portion of the exam is performed with the patient in reverse Trendelenburg.  +---------+---------------+---------+-----------+----------+--------------+ RIGHT    CompressibilityPhasicitySpontaneityPropertiesThrombus Aging +---------+---------------+---------+-----------+----------+--------------+ CFV      Full           Yes      Yes                                 +---------+---------------+---------+-----------+----------+--------------+ SFJ      Full                                                        +---------+---------------+---------+-----------+----------+--------------+ FV Prox  Full           Yes      Yes                                 +---------+---------------+---------+-----------+----------+--------------+ FV Mid   Full           Yes      Yes                                 +---------+---------------+---------+-----------+----------+--------------+ FV DistalFull           Yes      Yes                                 +---------+---------------+---------+-----------+----------+--------------+ PFV      Full                                                        +---------+---------------+---------+-----------+----------+--------------+ POP      Full           Yes      Yes                                 +---------+---------------+---------+-----------+----------+--------------+ PTV      Full                                                         +---------+---------------+---------+-----------+----------+--------------+  PERO     Full                                                        +---------+---------------+---------+-----------+----------+--------------+   +---------+---------------+---------+-----------+----------+--------------+ LEFT     CompressibilityPhasicitySpontaneityPropertiesThrombus Aging +---------+---------------+---------+-----------+----------+--------------+ CFV      Full           Yes      Yes                                 +---------+---------------+---------+-----------+----------+--------------+ SFJ      Full                                                        +---------+---------------+---------+-----------+----------+--------------+ FV Prox  Full           Yes      Yes                                 +---------+---------------+---------+-----------+----------+--------------+ FV Mid   Full           Yes      Yes                                 +---------+---------------+---------+-----------+----------+--------------+ FV DistalFull           Yes      Yes                                 +---------+---------------+---------+-----------+----------+--------------+ PFV      Full                                                        +---------+---------------+---------+-----------+----------+--------------+ POP      Full           Yes      Yes                                 +---------+---------------+---------+-----------+----------+--------------+ PTV      Full                                                        +---------+---------------+---------+-----------+----------+--------------+ PERO     Full                                                        +---------+---------------+---------+-----------+----------+--------------+  Summary: BILATERAL: - No evidence of deep vein thrombosis seen in  the lower extremities, bilaterally. -No evidence of popliteal cyst, bilaterally.   *See table(s) above for measurements and observations. Electronically signed by Harold Barban MD on 02/06/2022 at 5:58:22 PM.    Final    CT CHEST ABDOMEN PELVIS W CONTRAST  Result Date: 02/05/2022 CLINICAL DATA:  History of head neck cancer. Concern for occult malignancy. * Tracking Code: BO * EXAM: CT CHEST, ABDOMEN, AND PELVIS WITH CONTRAST TECHNIQUE: Multidetector CT imaging of the chest, abdomen and pelvis was performed following the standard protocol during bolus administration of intravenous contrast. RADIATION DOSE REDUCTION: This exam was performed according to the departmental dose-optimization program which includes automated exposure control, adjustment of the mA and/or kV according to patient size and/or use of iterative reconstruction technique. CONTRAST:  22m OMNIPAQUE IOHEXOL 300 MG/ML  SOLN COMPARISON:  Abdomen/pelvis CT 09/22/2008 FINDINGS: CT CHEST FINDINGS Cardiovascular: The heart size is normal. No substantial pericardial effusion. Coronary artery calcification is evident. Moderate atherosclerotic calcification is noted in the wall of the thoracic aorta. Mediastinum/Nodes: No mediastinal lymphadenopathy. There is no hilar lymphadenopathy. The esophagus has normal imaging features. There is no axillary lymphadenopathy. Lungs/Pleura: Centrilobular and paraseptal emphysema evident. Irregular spiculated right lower lobe nodule identified on image 112/4 measures 12 mm. Peripheral patchy and nodular airspace disease in the left lower lobe is probably infectious/inflammatory with atypical etiology a distinct consideration. No pleural effusion. Musculoskeletal: No worrisome lytic or sclerotic osseous abnormality. CT ABDOMEN PELVIS FINDINGS Hepatobiliary: No suspicious focal abnormality within the liver parenchyma. There is no evidence for gallstones, gallbladder wall thickening, or pericholecystic fluid. No  intrahepatic or extrahepatic biliary dilation. Pancreas: No focal mass lesion. No dilatation of the main duct. No intraparenchymal cyst. No peripancreatic edema. Spleen: No splenomegaly. No focal mass lesion. Adrenals/Urinary Tract: No adrenal nodule or mass. 3 mm nonobstructing stone identified upper pole right kidney. Vascular calcification noted in the hilum of the left kidney. No evidence for hydroureter. The urinary bladder appears normal for the degree of distention. Stomach/Bowel: Stomach is unremarkable. No gastric wall thickening. No evidence of outlet obstruction. Duodenum is normally positioned as is the ligament of Treitz. No small bowel wall thickening. No small bowel dilatation. The appendix is not well visualized, but there is no edema or inflammation in the region of the cecum. No gross colonic mass. No colonic wall thickening. Vascular/Lymphatic: There is moderate atherosclerotic calcification of the abdominal aorta without aneurysm. There is no gastrohepatic or hepatoduodenal ligament lymphadenopathy. No retroperitoneal or mesenteric lymphadenopathy. No pelvic sidewall lymphadenopathy. Reproductive: Unremarkable. Other: No intraperitoneal free fluid. Musculoskeletal: No worrisome lytic or sclerotic osseous abnormality. IMPRESSION: 1. 12 mm irregular spiculated right lower lobe pulmonary nodule. While this may be infectious/inflammatory, primary bronchogenic neoplasm could certainly have this appearance. Close follow-up recommended. 2. Peripheral patchy and nodular airspace disease in the left lower lobe is probably infectious/inflammatory with atypical etiology a distinct consideration. Consider follow-up CT chest in 3 months after therapy to evaluate for resolution. 12 mm right lower lobe pulmonary nodule could be reassessed at that time as well. 3. No evidence for metastatic disease in the abdomen or pelvis. 4. 3 mm nonobstructing right renal stone. 5. Aortic Atherosclerosis (ICD10-I70.0) and  Emphysema (ICD10-J43.9). Electronically Signed   By: EMisty StanleyM.D.   On: 02/05/2022 14:37   ECHOCARDIOGRAM LIMITED BUBBLE STUDY  Result Date: 02/05/2022    ECHOCARDIOGRAM LIMITED REPORT   [facility]   [facility]/2022 Medical  Rec #:  [id number]        Height:       64.0 in Accession #:    [id number]       Weight:       84.7 lb Date of Birth:  July 12, 1956        BSA:          1.358 m Patient Age:    12 years         BP:           120/61 mmHg Patient Gender: F                HR:           83 bpm. Exam Location:  Inpatient Procedure: Limited Echo and Saline Contrast Bubble Study Indications:    Stroke i63.9  History:        Patient has prior history of Echocardiogram examinations, most                 recent 02/01/2022. COPD; Risk Factors:Hypertension, Diabetes and                 Dyslipidemia.  Sonographer:    Raquel Sarna Senior RDCS Referring Phys: [id number] [facility]  Sonographer Comments: Limited for bubble study, performed from subcostal IMPRESSIONS  1. Left ventricular ejection fraction, by estimation, is 65 to 70%. The left ventricle has normal function.  2. Evidence of atrial level shunting detected by color flow Doppler. Agitated saline contrast bubble study was positive with shunting observed within 3-6 cardiac cycles suggestive of interatrial shunt. There is a small patent foramen ovale. FINDINGS  Left Ventricle: Left ventricular ejection fraction, by estimation, is 65 to 70%. The left ventricle has normal function. IAS/Shunts: Evidence of atrial level shunting detected by color flow Doppler. Agitated saline contrast was given intravenously to evaluate for intracardiac shunting. Agitated saline contrast bubble study was positive with shunting observed within 3-6 cardiac cycles suggestive of interatrial shunt. A small patent foramen ovale is detected. Candee Furbish MD Electronically signed by Candee Furbish MD Signature Date/Time: 02/05/2022/1:15:55 PM    Final         Scheduled  Meds:  arformoterol  15 mcg Nebulization BID   And   umeclidinium bromide  1 puff Inhalation Daily   aspirin  81 mg Oral Daily   atorvastatin  40 mg Oral QHS   budesonide (PULMICORT) nebulizer solution  0.5 mg Nebulization BID   clopidogrel  75 mg Oral Daily   enoxaparin (LOVENOX) injection  40 mg Subcutaneous Q24H   feeding supplement  237 mL Oral 5 X Daily   ipratropium-albuterol  3 mL Nebulization TID   predniSONE  40 mg Oral Q breakfast   Continuous Infusions:   LOS: 7 days    Time spent:40 min    Raven Torres, Geraldo Docker, MD [facility]   If 7PM-7AM, please contact night-coverage 02/06/2022, 5:58 PM

## 2022-02-06 NOTE — Progress Notes (Signed)
Physical Therapy Treatment Patient Details Name: Raven Torres MRN: [id number] DOB: [birth year] Today's Date: 02/06/2022   History of Present Illness 66 y.o. female with medical history of supraglottic cancer status postradiation, smoker, COPD, hypertension, high cholesterol here for worsening breathing over the past 6 days. Dx of PNA. Found to have wrist drop on 6/29 and MRi positive for CVA.    PT Comments    Pt supine in bed with O2NC removed, agreeable to be seen and to mobilize without O2.  Pt modified independent for bed mobility, min assist for transfer, and min guard for ambulation in hallway 107fx2 with seated rest break. SpO2 monitored during ambulation and dropped to 86%, had pt sit and performed pursed lip breathing for 388m and SpO2 rose to 91%, pt completed second bout of 5066fmbulation and returned to recliner in room, replaced [place] with 2LO2; educated pt that she may require O2 upon discharge. Educated pt on the importance of pacing activity to allow for return of breath, pt verbalized understanding. Pt was mobilizing L hand with R including wrist flexion/extension stretching and using small water bottle to maintain intrinsic plus hand, encouraged pt to continue and emphasized the importance of activity with L hand during acute phase of stroke recovery. Discharge destination remains appropriate, we will continue to follow acutely.   Recommendations for follow up therapy are one component of a multi-disciplinary discharge planning process, led by the attending physician.  Recommendations may be updated based on patient status, additional functional criteria and insurance authorization.  Follow Up Recommendations  Skilled nursing-short term rehab (<3 hours/day)     Assistance Recommended at Discharge Frequent or constant Supervision/Assistance  Patient can return home with the following A little help with walking and/or transfers;A little help with  bathing/dressing/bathroom;Assistance with cooking/housework;Assist for transportation;Help with stairs or ramp for entrance   Equipment Recommendations   (TBD at next venue)    Recommendations for Other Services       Precautions / Restrictions Precautions Precautions: Other (comment);Fall Precaution Comments: monitor o2 sat Restrictions Weight Bearing Restrictions: No     Mobility  Bed Mobility Overal bed mobility: Modified Independent Bed Mobility: Supine to Sit     Supine to sit: Modified independent (Device/Increase time)     General bed mobility comments: Increased time    Transfers Overall transfer level: Needs assistance Equipment used: None Transfers: Sit to/from Stand Sit to Stand: Min assist           General transfer comment: Pt used 1HHA to stand up with very mild min assist, mostly steadying.    Ambulation/Gait Ambulation/Gait assistance: Min guard Gait Distance (Feet): 100 Feet (two 95f43futs) Assistive device: None (vs hallway handrail) Gait Pattern/deviations: Step-through pattern, Decreased stride length, Narrow base of support Gait velocity: decr     General Gait Details: min guard for safety only, no physical assist provided or overt LOB ntoed. Slow, guarded gait. O2 ranged 86-91% on RA, provided seated rest break after 95ft44fting ~3min 64mn SpO2 dropped below 88% and SpO2 rose to 91% and pt completed second bout of 95ft u6f light handrail steadying with L hand. Dyspnea 2/4. Pt reported chest tightness with exertion and was coughing but unable to clear secretions, pt requesting decongestant, RN notified. Placed pt back on 2LO2 via [place] when returned to room.   Stairs             Wheelchair Mobility    Modified Rankin (Stroke Patients Only)       Balance  Overall balance assessment: Mild deficits observed, not formally tested   Sitting balance-Leahy Scale: Good     Standing balance support: Single extremity  supported Standing balance-Leahy Scale: Fair Standing balance comment: relies on single UE support in standing                            Cognition Arousal/Alertness: Awake/alert Behavior During Therapy: Anxious Overall Cognitive Status: Within Functional Limits for tasks assessed                                 General Comments: Pt has some anxiety        Exercises      General Comments        Pertinent Vitals/Pain Pain Assessment Pain Assessment: No/denies pain    Home Living                          Prior Function            PT Goals (current goals can now be found in the care plan section) Acute Rehab PT Goals Patient Stated Goal: gardening PT Goal Formulation: With patient Time For Goal Achievement: 02/14/22 Potential to Achieve Goals: Fair Progress towards PT goals: Progressing toward goals    Frequency    Min 3X/week      PT Plan Current plan remains appropriate    Co-evaluation              AM-PAC PT "6 Clicks" Mobility   Outcome Measure  Help needed turning from your back to your side while in a flat bed without using bedrails?: A Little Help needed moving from lying on your back to sitting on the side of a flat bed without using bedrails?: A Little Help needed moving to and from a bed to a chair (including a wheelchair)?: A Little Help needed standing up from a chair using your arms (e.g., wheelchair or bedside chair)?: A Little Help needed to walk in hospital room?: A Little Help needed climbing 3-5 steps with a railing? : A Lot 6 Click Score: 17    End of Session Equipment Utilized During Treatment: Gait belt (Oxygen removed for session but replaced at end of session) Activity Tolerance: Patient limited by fatigue Patient left: with call bell/phone within reach;in chair;with chair alarm set Nurse Communication: Mobility status PT Visit Diagnosis: Difficulty in walking, not elsewhere classified  (R26.2)     Time: 7322-0254 PT Time Calculation (min) (ACUTE ONLY): 21 min  Charges:  $Gait Training: 8-22 mins                    Coolidge Breeze, PT, DPT [facility] Department Office: [phone] Pager: [phone]  Coolidge Breeze 02/06/2022, 11:26 AM

## 2022-02-06 NOTE — Progress Notes (Signed)
BLE venous duplex has been completed.   Results can be found under chart review under CV PROC. 02/06/2022 10:32 AM Cassius Cullinane RVT, RDMS

## 2022-02-06 NOTE — Progress Notes (Signed)
Pt instructed on use of Flutter Valve.  Pt demonstrated good effort and technique.

## 2022-02-07 ENCOUNTER — Telehealth: Payer: Self-pay | Admitting: Student

## 2022-02-07 DIAGNOSIS — Z789 Other specified health status: Secondary | ICD-10-CM | POA: Diagnosis not present

## 2022-02-07 DIAGNOSIS — I639 Cerebral infarction, unspecified: Secondary | ICD-10-CM | POA: Diagnosis not present

## 2022-02-07 DIAGNOSIS — J189 Pneumonia, unspecified organism: Secondary | ICD-10-CM | POA: Diagnosis not present

## 2022-02-07 DIAGNOSIS — Z7401 Bed confinement status: Secondary | ICD-10-CM | POA: Diagnosis not present

## 2022-02-07 DIAGNOSIS — J984 Other disorders of lung: Secondary | ICD-10-CM | POA: Diagnosis not present

## 2022-02-07 DIAGNOSIS — J9621 Acute and chronic respiratory failure with hypoxia: Secondary | ICD-10-CM

## 2022-02-07 DIAGNOSIS — R069 Unspecified abnormalities of breathing: Secondary | ICD-10-CM | POA: Diagnosis not present

## 2022-02-07 DIAGNOSIS — Z720 Tobacco use: Secondary | ICD-10-CM | POA: Diagnosis not present

## 2022-02-07 DIAGNOSIS — J690 Pneumonitis due to inhalation of food and vomit: Secondary | ICD-10-CM | POA: Diagnosis not present

## 2022-02-07 DIAGNOSIS — J441 Chronic obstructive pulmonary disease with (acute) exacerbation: Secondary | ICD-10-CM | POA: Diagnosis not present

## 2022-02-07 DIAGNOSIS — I69354 Hemiplegia and hemiparesis following cerebral infarction affecting left non-dominant side: Secondary | ICD-10-CM | POA: Diagnosis not present

## 2022-02-07 DIAGNOSIS — B348 Other viral infections of unspecified site: Secondary | ICD-10-CM | POA: Diagnosis not present

## 2022-02-07 DIAGNOSIS — R609 Edema, unspecified: Secondary | ICD-10-CM | POA: Diagnosis not present

## 2022-02-07 DIAGNOSIS — R918 Other nonspecific abnormal finding of lung field: Secondary | ICD-10-CM

## 2022-02-07 DIAGNOSIS — M6281 Muscle weakness (generalized): Secondary | ICD-10-CM | POA: Diagnosis not present

## 2022-02-07 DIAGNOSIS — R1312 Dysphagia, oropharyngeal phase: Secondary | ICD-10-CM | POA: Diagnosis not present

## 2022-02-07 DIAGNOSIS — I69391 Dysphagia following cerebral infarction: Secondary | ICD-10-CM | POA: Diagnosis not present

## 2022-02-07 DIAGNOSIS — Z743 Need for continuous supervision: Secondary | ICD-10-CM | POA: Diagnosis not present

## 2022-02-07 DIAGNOSIS — C321 Malignant neoplasm of supraglottis: Secondary | ICD-10-CM | POA: Diagnosis not present

## 2022-02-07 DIAGNOSIS — K219 Gastro-esophageal reflux disease without esophagitis: Secondary | ICD-10-CM | POA: Diagnosis not present

## 2022-02-07 DIAGNOSIS — I5031 Acute diastolic (congestive) heart failure: Secondary | ICD-10-CM | POA: Diagnosis not present

## 2022-02-07 DIAGNOSIS — R2689 Other abnormalities of gait and mobility: Secondary | ICD-10-CM | POA: Diagnosis not present

## 2022-02-07 DIAGNOSIS — I69828 Other speech and language deficits following other cerebrovascular disease: Secondary | ICD-10-CM | POA: Diagnosis not present

## 2022-02-07 DIAGNOSIS — E785 Hyperlipidemia, unspecified: Secondary | ICD-10-CM | POA: Diagnosis not present

## 2022-02-07 DIAGNOSIS — I69891 Dysphagia following other cerebrovascular disease: Secondary | ICD-10-CM | POA: Diagnosis not present

## 2022-02-07 DIAGNOSIS — E43 Unspecified severe protein-calorie malnutrition: Secondary | ICD-10-CM | POA: Diagnosis not present

## 2022-02-07 DIAGNOSIS — Z515 Encounter for palliative care: Secondary | ICD-10-CM | POA: Diagnosis not present

## 2022-02-07 DIAGNOSIS — F17200 Nicotine dependence, unspecified, uncomplicated: Secondary | ICD-10-CM | POA: Diagnosis not present

## 2022-02-07 DIAGNOSIS — I69328 Other speech and language deficits following cerebral infarction: Secondary | ICD-10-CM | POA: Diagnosis not present

## 2022-02-07 DIAGNOSIS — R531 Weakness: Secondary | ICD-10-CM | POA: Diagnosis not present

## 2022-02-07 DIAGNOSIS — K1230 Oral mucositis (ulcerative), unspecified: Secondary | ICD-10-CM | POA: Diagnosis not present

## 2022-02-07 DIAGNOSIS — R404 Transient alteration of awareness: Secondary | ICD-10-CM | POA: Diagnosis not present

## 2022-02-07 DIAGNOSIS — R110 Nausea: Secondary | ICD-10-CM | POA: Diagnosis not present

## 2022-02-07 DIAGNOSIS — G4700 Insomnia, unspecified: Secondary | ICD-10-CM | POA: Diagnosis not present

## 2022-02-07 LAB — CBC
HCT: 42.7 % (ref 36.0–46.0)
Hemoglobin: 13.4 g/dL (ref 12.0–15.0)
MCH: 30.3 pg (ref 26.0–34.0)
MCHC: 31.4 g/dL (ref 30.0–36.0)
MCV: 96.6 fL (ref 80.0–100.0)
Platelets: 180 10*3/uL (ref 150–400)
RBC: 4.42 MIL/uL (ref 3.87–5.11)
RDW: 12.8 % (ref 11.5–15.5)
WBC: 10.5 10*3/uL (ref 4.0–10.5)
nRBC: 0 % (ref 0.0–0.2)

## 2022-02-07 LAB — COMPREHENSIVE METABOLIC PANEL
ALT: 28 U/L (ref 0–44)
AST: 31 U/L (ref 15–41)
Albumin: 2.7 g/dL — ABNORMAL LOW (ref 3.5–5.0)
Alkaline Phosphatase: 48 U/L (ref 38–126)
Anion gap: 4 — ABNORMAL LOW (ref 5–15)
BUN: 29 mg/dL — ABNORMAL HIGH (ref 8–23)
CO2: 36 mmol/L — ABNORMAL HIGH (ref 22–32)
Calcium: 9 mg/dL (ref 8.9–10.3)
Chloride: 103 mmol/L (ref 98–111)
Creatinine, Ser: 0.44 mg/dL (ref 0.44–1.00)
GFR, Estimated: >60 mL/min (ref >60)
Glucose, Bld: 92 mg/dL (ref 70–99)
Potassium: 4.2 mmol/L (ref 3.5–5.1)
Sodium: 143 mmol/L (ref 135–145)
Total Bilirubin: 0.4 mg/dL (ref 0.3–1.2)
Total Protein: 6 g/dL — ABNORMAL LOW (ref 6.5–8.1)

## 2022-02-07 LAB — MAGNESIUM: Magnesium: 2.1 mg/dL (ref 1.7–2.4)

## 2022-02-07 LAB — PHOSPHORUS: Phosphorus: 2.5 mg/dL (ref 2.5–4.6)

## 2022-02-07 MED ORDER — GUAIFENESIN-DM 100-10 MG/5ML PO SYRP: 5.0000 mL | ORAL_SOLUTION | ORAL | 0 refills | Status: DC | PRN

## 2022-02-07 MED ORDER — ASPIRIN 81 MG PO CHEW: 81.0000 mg | CHEWABLE_TABLET | Freq: Every day | ORAL | 0 refills | Status: DC

## 2022-02-07 NOTE — Care Management Important Message (Signed)
Important Message  Patient Details IM Letter given to the Patient. Name: Raven Torres MRN: [id number] Date of Birth: 07-Dec-1955   Medicare Important Message Given:  Yes     Kerin Salen 02/07/2022, 10:36 AM

## 2022-02-07 NOTE — Telephone Encounter (Signed)
Can we set up clinic follow up with me in about 10 weeks to review CT Chest (ordered to be done in 8 weeks)?

## 2022-02-07 NOTE — Consult Note (Signed)
   [facility] Inpatient Consult   02/07/2022  GABREILLE DARDIS Dec 23, 1955 [id number]  [place] [facility] [[facility]] Patient: [employer]  *Remote review for length of stay, patient is at [facility]  Primary Care Provider:  Ladell Pier, MD, [facility]   If the patient goes to a [facility] affiliated facility then, patient can be followed by [place] [facility] RN with traditional Medicare. At the time of this review patient is for [facility].   Plan:   [facility] PAC RN can follow for any known or needs for transitional care needs for returning to post facility care or complex disease management.  For questions or referrals, please contact:   Natividad Brood, RN BSN [facility] Liaison  [phone] business mobile phone Toll free office [phone]  Fax number: [phone] [email or web address]

## 2022-02-07 NOTE — Discharge Summary (Addendum)
Physician Discharge Summary  CLAUDEAN LEAVELLE GUR:[id number] DOB: July 23, 1956 DOA: 01/30/2022  PCP: Ladell Pier, MD  Admit date: 01/30/2022 Discharge date: 02/07/2022  Time spent: 35 minutes  Recommendations for Outpatient Follow-up:    Acute RIGHT parietal ischemic infarct -Likely embolic secondary to hypercoagulability -Patient was found to have weakness of the left wrist on 01/31/2022 which had started more than 12 hours ago.  Case was discussed with neurology.  Patient was out of the window for any intervention.  -Neurology recommended to complete stroke work-up at [facility] and recommended against transfer.   -She was found to have infarct  -Currently on aspirin 81 mg daily. -CTA head/neck: Extensive atheromatous changes multifocal narrowing, suspected small filling defect along the posterior wall of the proximal cervical right ICA suspicious for thrombus. -LDL 91.   -Lipitor 40 mg daily - 7/1 hemoglobin A1c= 5.0 -Echo showed EF of 60 to 65% with grade 2 diastolic dysfunction.  Patient is not a good candidate for TEE as per cardiology input and hence TEE has been canceled -Pulmonary vascular team;  Recommend aspirin for now and start full dose Lovenox or Eliquis from February 06, 2022 onwards after discussion with oncology.  -ontinue Lipitor.   -Given that patient's bilateral lower extremity Doppler negative for DVT Aspirin and Plavix for 3 weeks followed by Aspirin '81mg'$  daily alone, per Neurology -Follow-up with [facility] in 8 weeks.   Acute respiratory failure with hypoxia/CAP/COPD exacerbation/positive parainfluenza virus 4 -Left sided community-acquired pneumonia -Prednisone 40 mg daily - Completed 5-day course antibiotics - Flutter valve - Incentive spirometry - DuoNeb QID   Multiple lung nodules/RLL lung nodule - Patient has multiple lung nodules which in this high risk patient would meet criteria for suspicion of malignancy.  Especially RLL  1.2 cm spiculated nodule - Given patient's severe emphysema, and other core morbidities would not recommend even attempting to biopsy. - Discussed case with Dr. Leslye Peer PCCM, and he concurs patient IS NOT a good candidate for lung biopsy.  Patient to follow-up as outpatient in 10 weeks to review repeat CT chest. - Dr. Leslye Peer PCCM has ordered follow-up chest CT in 8 weeks -Scheduled consult (pt needs to complete DPR for our office) on 9/14 with NM. Reminder mailed and appt should be printed on discharge papers as well.  [facility]   Acute Diastolic CHF - 2/83 echocardiogram consistent with acute diastolic CHF see results below - Strict in and out +6.0 L - Daily weight Filed Weights   01/30/22 1326 01/30/22 1511 02/07/22 0500  Weight: 38.4 kg 38.4 kg 38.2 kg  - Given patient's current BP and debilitated state do not believe patient will tolerate normal CHF medication.   Subglottic malignancy s/p XRT  -Chronic dysphagia and aspiration -Cancer related cachexia/severe protein calorie malnutrition -Generalized conditioning/debility -Diet as per SLP recommendations.  Overall prognosis is guarded to poor.  . -Palliative care following.  Patient will need outpatient palliative care follow-up.   -Throat cancer treated with chemoradiation: Currently not on active treatment   Hypernatremia -Resolved  Leukocytosis -Patient currently on steroids, negative bands, negative left shift, negative fever.     Right/left/medial coccygeal stage I pressure injury:  -Present on admission -Continue local wound care.  Severe protein calorie malnutrition - Patient eating well, sister to continue to encourage patient to eat      Discharge Diagnoses:  Principal Problem:   Pneumonia Active Problems:   Tobacco abuse   Malignant neoplasm of supraglottis ([place])  Protein-calorie malnutrition, severe   Pressure injury of skin   Acute ischemic stroke (HCC)   Acute  respiratory failure with hypoxia (HCC)   Multiple lung nodules on CT   Infection due to parainfluenza virus 4   Discharge Condition: Guarded  Diet recommendation: Full  Filed Weights   01/30/22 1326 01/30/22 1511 02/07/22 0500  Weight: 38.4 kg 38.4 kg 38.2 kg    History of present illness:  66 year old WF PMHx supraglottic cancer s/p radiation, tobacco abuse, COPD, essential HTN, HLD.   Presented to the ER with about 6 days of worsening shortness of breath, coughing up more sputum and also subjective fever at home.  Felt very weak.  She does have chronic aspiration but currently on regular diet.  Used to have PEG tube but none now. In the emergency room, reportedly she was brought on nonrebreather.  WBC count normal.  Chest x-ray showed advanced COPD changes along with left middle lobe infiltrate.  Admitted with aspiration pneumonia and treated with antibiotics and oxygen.  Does not use oxygen at home.   On 01/31/2022, patient was found to have weakness of the left wrist which had started more than 12 hours ago.  She was found to have - as below.  Neurology was consulted.    Hospital Course:  See above   Procedures: 6/30 Echocardiogram Left Ventricle: LVEF= 60 to 65%. No regional wall motion abnormalities.  -Left ventricular diastolic parameters are consistent with Grade II diastolic dysfunction (pseudonormalization).  -Mitral Valve: The mitral valve is abnormal. Moderate thickening of the mitral valve leaflet(s). Moderate calcification of the mitral valve leaflet(s).  7/4 CT chest abdomen pelvis W contrast: IMPRESSION: 1. 12 mm irregular spiculated right lower lobe pulmonary nodule. While this may be infectious/inflammatory, primary bronchogenic neoplasm could certainly have this appearance. Close follow-up recommended. 2. Peripheral patchy and nodular airspace disease in the left lower lobe is probably infectious/inflammatory with atypical etiology a distinct consideration.  Consider follow-up CT chest in 3 months after therapy to evaluate for resolution. 12 mm right lower lobe pulmonary nodule could be reassessed at that time as well. 3. No evidence for metastatic disease in the abdomen or pelvis. 4. 3 mm nonobstructing right renal stone. 5. Aortic Atherosclerosis (ICD10-I70.0) and Emphysema (ICD10-J43.9).   Consultations: Oncology Neurology   Cultures  6/28 respiratory virus panel positive parainfluenza virus 4   Antibiotics Anti-infectives (From admission, onward)    Start     Ordered Stop   01/31/22 1000  cefTRIAXone (ROCEPHIN) 1 g in sodium chloride 0.9 % 100 mL IVPB  Status:  Discontinued        01/30/22 1300 02/03/22 1536   01/30/22 1030  cefTRIAXone (ROCEPHIN) 1 g in sodium chloride 0.9 % 100 mL IVPB        01/30/22 1017 01/30/22 1109   01/30/22 1030  azithromycin (ZITHROMAX) 500 mg in sodium chloride 0.9 % 250 mL IVPB  Status:  Discontinued        01/30/22 1017 02/03/22 1536         Discharge Exam: Vitals:   02/07/22 0715 02/07/22 0846 02/07/22 0847 02/07/22 0848  BP:      Pulse:      Resp:      Temp:      TempSrc:      SpO2: 94% 94% 94% 94%  Weight:      Height:        General: A/O x4, positive acute respiratory distress, cachectic Eyes: negative scleral hemorrhage, negative anisocoria, negative icterus  ENT: Negative Runny nose, negative gingival bleeding, Neck:  Negative scars, masses, torticollis, lymphadenopathy, JVD Lungs: tachypneic, decreased air movement bilaterally, positive bilateral rhonchi, positive expiratory wheezes.   Discharge Instructions  Discharge Instructions     Amb Referral to Palliative Care   Complete by: As directed    Ambulatory referral to Cardiology   Complete by: As directed    Implantable loop recorder placement for cryptogenic stroke   Ambulatory referral to Hematology / Oncology   Complete by: As directed    Ambulatory referral to Neurology   Complete by: As directed    An appointment  is requested in approximately: 2 weeks   Diet - low sodium heart healthy   Complete by: As directed    Increase activity slowly   Complete by: As directed    No wound care   Complete by: As directed       Allergies as of 02/07/2022       Reactions   Tudorza Pressair [aclidinium Bromide] Itching   Amitriptyline Other (See Comments)   Eye spasms per pt   Codeine Nausea Only   Tramadol Nausea Only        Medication List     STOP taking these medications    varenicline 0.5 MG X 11 & 1 MG X 42 tablet Commonly known as: Chantix Starting Month Pak       TAKE these medications    albuterol (2.5 MG/3ML) 0.083% nebulizer solution Commonly known as: PROVENTIL Take 3 mLs (2.5 mg total) by nebulization every 6 (six) hours as needed for wheezing or shortness of breath.   albuterol 108 (90 Base) MCG/ACT inhaler Commonly known as: VENTOLIN HFA INHALE 2 PUFFS INTO THE LUNGS EVERY 6 HOURS AS NEEDED FOR WHEEZING OR SHORTNESS OF BREATH   ALPRAZolam 0.25 MG tablet Commonly known as: XANAX Take 1 tablet (0.25 mg total) by mouth 2 (two) times daily as needed for anxiety.   aspirin 81 MG chewable tablet Chew 1 tablet (81 mg total) by mouth daily. Start taking on: February 08, 2022   atorvastatin 40 MG tablet Commonly known as: LIPITOR Take 1 tablet (40 mg total) by mouth at bedtime.   Bevespi Aerosphere 9-4.8 MCG/ACT Aero Generic drug: Glycopyrrolate-Formoterol Inhale 2 puffs into the lungs 2 (two) times daily.   clopidogrel 75 MG tablet Commonly known as: PLAVIX Take 1 tablet (75 mg total) by mouth daily.   feeding supplement Liqd Take 237 mLs by mouth 5 (five) times daily. What changed: when to take this   guaiFENesin-dextromethorphan 100-10 MG/5ML syrup Commonly known as: ROBITUSSIN DM Take 5 mLs by mouth every 4 (four) hours as needed for cough.   lidocaine 2 % solution Commonly known as: XYLOCAINE Use as directed 15 mLs in the mouth or throat in the morning, at noon, in  the evening, and at bedtime. Patient: Mix 1part 2% viscous lidocaine, 1part H20. Swallow 74m of diluted mixture, 320m before meals and at bedtime, up to QID   predniSONE 20 MG tablet Commonly known as: DELTASONE Take 2 tablets (40 mg total) by mouth daily with breakfast for 7 days.       Allergies  Allergen Reactions   Tudorza Pressair [Aclidinium Bromide] Itching   Amitriptyline Other (See Comments)    Eye spasms per pt   Codeine Nausea Only   Tramadol Nausea Only    Contact information for after-discharge care     Destination     [facility] Preferred SNF .  Service: Skilled Nursing Contact information: [address] [place] [phone]                      The results of significant diagnostics from this hospitalization (including imaging, microbiology, ancillary and laboratory) are listed below for reference.    Significant Diagnostic Studies: VAS [place] LOWER EXTREMITY VENOUS (DVT)  Result Date: 02/06/2022  Lower Venous DVT Study Patient Name:  SHAIANN MCMANAMON [facility]  Date of Exam:   02/06/2022 Medical Rec #: [id number]         Accession #:    [id number] Date of Birth: 03-Dec-1955         Patient Gender: F Patient Age:   82 years Exam Location:  [facility] Procedure:      VAS [place] LOWER EXTREMITY VENOUS (DVT) Referring Phys: Donnetta Simpers --------------------------------------------------------------------------------  Indications: Pain - per MD order (no pain per patient).  Comparison Study: No previous exams Performing Technologist: Jody Hill RVT, RDMS  Examination Guidelines: A complete evaluation includes B-mode imaging, spectral Doppler, color Doppler, and power Doppler as needed of all accessible portions of each vessel. Bilateral testing is considered an integral part of a complete examination. Limited examinations for reoccurring indications may be performed as noted. The reflux portion of the exam is performed  with the patient in reverse Trendelenburg.  +---------+---------------+---------+-----------+----------+--------------+ RIGHT    CompressibilityPhasicitySpontaneityPropertiesThrombus Aging +---------+---------------+---------+-----------+----------+--------------+ CFV      Full           Yes      Yes                                 +---------+---------------+---------+-----------+----------+--------------+ SFJ      Full                                                        +---------+---------------+---------+-----------+----------+--------------+ FV Prox  Full           Yes      Yes                                 +---------+---------------+---------+-----------+----------+--------------+ FV Mid   Full           Yes      Yes                                 +---------+---------------+---------+-----------+----------+--------------+ FV DistalFull           Yes      Yes                                 +---------+---------------+---------+-----------+----------+--------------+ PFV      Full                                                        +---------+---------------+---------+-----------+----------+--------------+ POP      Full  Yes      Yes                                 +---------+---------------+---------+-----------+----------+--------------+ PTV      Full                                                        +---------+---------------+---------+-----------+----------+--------------+ PERO     Full                                                        +---------+---------------+---------+-----------+----------+--------------+   +---------+---------------+---------+-----------+----------+--------------+ LEFT     CompressibilityPhasicitySpontaneityPropertiesThrombus Aging +---------+---------------+---------+-----------+----------+--------------+ CFV      Full           Yes      Yes                                  +---------+---------------+---------+-----------+----------+--------------+ SFJ      Full                                                        +---------+---------------+---------+-----------+----------+--------------+ FV Prox  Full           Yes      Yes                                 +---------+---------------+---------+-----------+----------+--------------+ FV Mid   Full           Yes      Yes                                 +---------+---------------+---------+-----------+----------+--------------+ FV DistalFull           Yes      Yes                                 +---------+---------------+---------+-----------+----------+--------------+ PFV      Full                                                        +---------+---------------+---------+-----------+----------+--------------+ POP      Full           Yes      Yes                                 +---------+---------------+---------+-----------+----------+--------------+ PTV      Full                                                        +---------+---------------+---------+-----------+----------+--------------+  PERO     Full                                                        +---------+---------------+---------+-----------+----------+--------------+     Summary: BILATERAL: - No evidence of deep vein thrombosis seen in the lower extremities, bilaterally. -No evidence of popliteal cyst, bilaterally.   *See table(s) above for measurements and observations. Electronically signed by Harold Barban MD on 02/06/2022 at 5:58:22 PM.    Final    CT CHEST ABDOMEN PELVIS W CONTRAST  Result Date: 02/05/2022 CLINICAL DATA:  History of head neck cancer. Concern for occult malignancy. * Tracking Code: BO * EXAM: CT CHEST, ABDOMEN, AND PELVIS WITH CONTRAST TECHNIQUE: Multidetector CT imaging of the chest, abdomen and pelvis was performed following the standard protocol during bolus administration of intravenous  contrast. RADIATION DOSE REDUCTION: This exam was performed according to the departmental dose-optimization program which includes automated exposure control, adjustment of the mA and/or kV according to patient size and/or use of iterative reconstruction technique. CONTRAST:  60m OMNIPAQUE IOHEXOL 300 MG/ML  SOLN COMPARISON:  Abdomen/pelvis CT 09/22/2008 FINDINGS: CT CHEST FINDINGS Cardiovascular: The heart size is normal. No substantial pericardial effusion. Coronary artery calcification is evident. Moderate atherosclerotic calcification is noted in the wall of the thoracic aorta. Mediastinum/Nodes: No mediastinal lymphadenopathy. There is no hilar lymphadenopathy. The esophagus has normal imaging features. There is no axillary lymphadenopathy. Lungs/Pleura: Centrilobular and paraseptal emphysema evident. Irregular spiculated right lower lobe nodule identified on image 112/4 measures 12 mm. Peripheral patchy and nodular airspace disease in the left lower lobe is probably infectious/inflammatory with atypical etiology a distinct consideration. No pleural effusion. Musculoskeletal: No worrisome lytic or sclerotic osseous abnormality. CT ABDOMEN PELVIS FINDINGS Hepatobiliary: No suspicious focal abnormality within the liver parenchyma. There is no evidence for gallstones, gallbladder wall thickening, or pericholecystic fluid. No intrahepatic or extrahepatic biliary dilation. Pancreas: No focal mass lesion. No dilatation of the main duct. No intraparenchymal cyst. No peripancreatic edema. Spleen: No splenomegaly. No focal mass lesion. Adrenals/Urinary Tract: No adrenal nodule or mass. 3 mm nonobstructing stone identified upper pole right kidney. Vascular calcification noted in the hilum of the left kidney. No evidence for hydroureter. The urinary bladder appears normal for the degree of distention. Stomach/Bowel: Stomach is unremarkable. No gastric wall thickening. No evidence of outlet obstruction. Duodenum is  normally positioned as is the ligament of Treitz. No small bowel wall thickening. No small bowel dilatation. The appendix is not well visualized, but there is no edema or inflammation in the region of the cecum. No gross colonic mass. No colonic wall thickening. Vascular/Lymphatic: There is moderate atherosclerotic calcification of the abdominal aorta without aneurysm. There is no gastrohepatic or hepatoduodenal ligament lymphadenopathy. No retroperitoneal or mesenteric lymphadenopathy. No pelvic sidewall lymphadenopathy. Reproductive: Unremarkable. Other: No intraperitoneal free fluid. Musculoskeletal: No worrisome lytic or sclerotic osseous abnormality. IMPRESSION: 1. 12 mm irregular spiculated right lower lobe pulmonary nodule. While this may be infectious/inflammatory, primary bronchogenic neoplasm could certainly have this appearance. Close follow-up recommended. 2. Peripheral patchy and nodular airspace disease in the left lower lobe is probably infectious/inflammatory with atypical etiology a distinct consideration. Consider follow-up CT chest in 3 months after therapy to evaluate for resolution. 12 mm right lower lobe pulmonary nodule could be reassessed at that time as well. 3. No evidence for  metastatic disease in the abdomen or pelvis. 4. 3 mm nonobstructing right renal stone. 5. Aortic Atherosclerosis (ICD10-I70.0) and Emphysema (ICD10-J43.9). Electronically Signed   By: Misty Stanley M.D.   On: 02/05/2022 14:37   ECHOCARDIOGRAM LIMITED BUBBLE STUDY  Result Date: 02/05/2022    ECHOCARDIOGRAM LIMITED REPORT   Patient Name:   MEITAL RIEHL [facility] Date of Exam: 02/05/2022 Medical Rec #:  [id number]        Height:       64.0 in Accession #:    [id number]       Weight:       84.7 lb Date of Birth:  02-Aug-1956        BSA:          1.358 m Patient Age:    37 years         BP:           120/61 mmHg Patient Gender: F                HR:           83 bpm. Exam Location:  Inpatient Procedure: Limited Echo and Saline  Contrast Bubble Study Indications:    Stroke i63.9  History:        Patient has prior history of Echocardiogram examinations, most                 recent 02/01/2022. COPD; Risk Factors:Hypertension, Diabetes and                 Dyslipidemia.  Sonographer:    Raquel Sarna Senior RDCS Referring Phys: [id number] [facility]  Sonographer Comments: Limited for bubble study, performed from subcostal IMPRESSIONS  1. Left ventricular ejection fraction, by estimation, is 65 to 70%. The left ventricle has normal function.  2. Evidence of atrial level shunting detected by color flow Doppler. Agitated saline contrast bubble study was positive with shunting observed within 3-6 cardiac cycles suggestive of interatrial shunt. There is a small patent foramen ovale. FINDINGS  Left Ventricle: Left ventricular ejection fraction, by estimation, is 65 to 70%. The left ventricle has normal function. IAS/Shunts: Evidence of atrial level shunting detected by color flow Doppler. Agitated saline contrast was given intravenously to evaluate for intracardiac shunting. Agitated saline contrast bubble study was positive with shunting observed within 3-6 cardiac cycles suggestive of interatrial shunt. A small patent foramen ovale is detected. Candee Furbish MD Electronically signed by Candee Furbish MD Signature Date/Time: 02/05/2022/1:15:55 PM    Final    VAS US CAROTID  Result Date: 02/03/2022 Carotid Arterial Duplex Study Patient Name:  AASHRITHA MIEDEMA [facility]  Date of Exam:   02/02/2022 Medical Rec #: [id number]         Accession #:    [id number] Date of Birth: Oct 18, 1955         Patient Gender: F Patient Age:   20 years Exam Location:  [facility] Procedure:      VAS US CAROTID Referring Phys: Beulah Gandy --------------------------------------------------------------------------------  Indications:       CVA. Risk Factors:      Hypertension, Diabetes. Limitations        Today's exam was limited due to the body habitus of the                    patient,  heavy calcification and the resulting shadowing, the                    patient's respiratory  variation and patient positioning,                    patient movementm. Comparison Study:  No prior studies. Performing Technologist: Oliver Hum RVT  Examination Guidelines: A complete evaluation includes B-mode imaging, spectral Doppler, color Doppler, and power Doppler as needed of all accessible portions of each vessel. Bilateral testing is considered an integral part of a complete examination. Limited examinations for reoccurring indications may be performed as noted.  Right Carotid Findings: +----------+-------+-------+--------+---------------------------------+--------+           PSV    EDV    StenosisPlaque Description               Comments           cm/s   cm/s                                                     +----------+-------+-------+--------+---------------------------------+--------+ CCA Prox  47     8              smooth, heterogenous and calcific         +----------+-------+-------+--------+---------------------------------+--------+ CCA Distal47     12             smooth and heterogenous                   +----------+-------+-------+--------+---------------------------------+--------+ ICA Prox  82     23             irregular, calcific and                                                   heterogenous                              +----------+-------+-------+--------+---------------------------------+--------+ ICA Distal62     17                                                       +----------+-------+-------+--------+---------------------------------+--------+ ECA       60     12                                                       +----------+-------+-------+--------+---------------------------------+--------+ +----------+--------+-------+--------+-------------------+           PSV cm/sEDV cmsDescribeArm Pressure (mmHG)  +----------+--------+-------+--------+-------------------+ [id number]                                        +----------+--------+-------+--------+-------------------+ +---------+--------+--+--------+--+---------+ VertebralPSV cm/s65EDV cm/s14Antegrade +---------+--------+--+--------+--+---------+  Left Carotid Findings: +----------+-------+-------+--------+---------------------------------+--------+           PSV    EDV    StenosisPlaque Description  Comments           cm/s   cm/s                                                     +----------+-------+-------+--------+---------------------------------+--------+ CCA Prox  53     12             smooth and calcific                       +----------+-------+-------+--------+---------------------------------+--------+ CCA Distal51     14             smooth and heterogenous                   +----------+-------+-------+--------+---------------------------------+--------+ ICA Prox  147    21             irregular, heterogenous and                                               calcific                                  +----------+-------+-------+--------+---------------------------------+--------+ ICA Distal77     17                                                       +----------+-------+-------+--------+---------------------------------+--------+ ECA       65     15                                                       +----------+-------+-------+--------+---------------------------------+--------+ +----------+--------+--------+--------+-------------------+           PSV cm/sEDV cm/sDescribeArm Pressure (mmHG) +----------+--------+--------+--------+-------------------+ [id number]                                         +----------+--------+--------+--------+-------------------+ +---------+--------+--+--------+-+---------+ VertebralPSV cm/s27EDV cm/s7Antegrade  +---------+--------+--+--------+-+---------+   Summary: Right Carotid: Velocities in the right ICA are consistent with a 1-39% stenosis. Left Carotid: Velocities in the left ICA are consistent with a 1-39% stenosis. Vertebrals: Bilateral vertebral arteries demonstrate antegrade flow. *See table(s) above for measurements and observations.  Electronically signed by Jamelle Haring on 02/03/2022 at 12:16:33 PM.    Final    ECHOCARDIOGRAM COMPLETE  Result Date: 02/01/2022    ECHOCARDIOGRAM REPORT   Patient Name:   TENISE STETLER [facility] Date of Exam: 02/01/2022 Medical Rec #:  [id number]        Height:       64.0 in Accession #:    [id number]       Weight:       84.7 lb Date of Birth:  1956/07/19        BSA:  1.358 m Patient Age:    51 years         BP:           121/76 mmHg Patient Gender: F                HR:           73 bpm. Exam Location:  Inpatient Procedure: 2D Echo, Cardiac Doppler and Color Doppler Indications:    Stroke  History:        Patient has no prior history of Echocardiogram examinations.                 COPD; Risk Factors:Former Smoker, Hypertension and HLD.  Sonographer:    Joette Catching RCS Referring Phys: [id number] [facility]  Sonographer Comments: Image acquisition challenging due to patient body habitus. IMPRESSIONS  1. Left ventricular ejection fraction, by estimation, is 60 to 65%. The left ventricle has normal function. The left ventricle has no regional wall motion abnormalities. Left ventricular diastolic parameters are consistent with Grade II diastolic dysfunction (pseudonormalization). Elevated left ventricular end-diastolic pressure.  2. Right ventricular systolic function is normal. The right ventricular size is normal.  3. The mitral valve is abnormal. Trivial mitral valve regurgitation. No evidence of mitral stenosis. Moderate mitral annular calcification.  4. The aortic valve is tricuspid. There is mild calcification of the aortic valve. Aortic valve regurgitation is not  visualized. Aortic valve sclerosis is present, with no evidence of aortic valve stenosis.  5. The inferior vena cava is normal in size with <50% respiratory variability, suggesting right atrial pressure of 8 mmHg. FINDINGS  Left Ventricle: Left ventricular ejection fraction, by estimation, is 60 to 65%. The left ventricle has normal function. The left ventricle has no regional wall motion abnormalities. The left ventricular internal cavity size was normal in size. There is  no left ventricular hypertrophy. Left ventricular diastolic parameters are consistent with Grade II diastolic dysfunction (pseudonormalization). Elevated left ventricular end-diastolic pressure. Right Ventricle: The right ventricular size is normal. No increase in right ventricular wall thickness. Right ventricular systolic function is normal. Left Atrium: Left atrial size was normal in size. Right Atrium: Right atrial size was normal in size. Pericardium: Trivial pericardial effusion is present. The pericardial effusion is anterior to the right ventricle. Mitral Valve: The mitral valve is abnormal. There is moderate thickening of the mitral valve leaflet(s). There is moderate calcification of the mitral valve leaflet(s). Moderate mitral annular calcification. Trivial mitral valve regurgitation. No evidence of mitral valve stenosis. Tricuspid Valve: The tricuspid valve is normal in structure. Tricuspid valve regurgitation is not demonstrated. No evidence of tricuspid stenosis. Aortic Valve: The aortic valve is tricuspid. There is mild calcification of the aortic valve. Aortic valve regurgitation is not visualized. Aortic valve sclerosis is present, with no evidence of aortic valve stenosis. Aortic valve mean gradient measures 4.0 mmHg. Aortic valve peak gradient measures 7.2 mmHg. Aortic valve area, by VTI measures 1.73 cm. Pulmonic Valve: The pulmonic valve was normal in structure. Pulmonic valve regurgitation is not visualized. No evidence of  pulmonic stenosis. Aorta: The aortic root is normal in size and structure. Venous: The inferior vena cava is normal in size with less than 50% respiratory variability, suggesting right atrial pressure of 8 mmHg. IAS/Shunts: No atrial level shunt detected by color flow Doppler.  LEFT VENTRICLE PLAX 2D LVIDd:         4.00 cm   Diastology LVIDs:         2.50 cm  LV e' medial:    4.46 cm/s LV PW:         0.80 cm   LV E/e' medial:  23.3 LV IVS:        0.90 cm   LV e' lateral:   5.22 cm/s LVOT diam:     1.70 cm   LV E/e' lateral: 19.9 LV SV:         45 LV SV Index:   33 LVOT Area:     2.27 cm  RIGHT VENTRICLE          IVC RV Basal diam:  2.10 cm  IVC diam: 1.90 cm RV Mid diam:    1.60 cm TAPSE (M-mode): 1.4 cm LEFT ATRIUM             Index        RIGHT ATRIUM          Index LA diam:        2.20 cm 1.62 cm/m   RA Area:     5.96 cm LA Vol (A2C):   19.2 ml 14.14 ml/m  RA Volume:   9.07 ml  6.68 ml/m LA Vol (A4C):   18.7 ml 13.77 ml/m LA Biplane Vol: 19.9 ml 14.66 ml/m  AORTIC VALVE                    PULMONIC VALVE AV Area (Vmax):    1.80 cm     PV Vmax:       1.11 m/s AV Area (Vmean):   1.77 cm     PV Peak grad:  4.9 mmHg AV Area (VTI):     1.73 cm AV Vmax:           134.00 cm/s AV Vmean:          89.600 cm/s AV VTI:            0.260 m AV Peak Grad:      7.2 mmHg AV Mean Grad:      4.0 mmHg LVOT Vmax:         106.00 cm/s LVOT Vmean:        69.750 cm/s LVOT VTI:          0.198 m LVOT/AV VTI ratio: 0.76  AORTA Ao Root diam: 2.60 cm MITRAL VALVE                TRICUSPID VALVE MV Area (PHT): 3.46 cm     TR Peak grad:   21.0 mmHg MV Decel Time: 219 msec     TR Vmax:        229.00 cm/s MV E velocity: 104.00 cm/s MV A velocity: 99.00 cm/s   SHUNTS MV E/A ratio:  1.05         Systemic VTI:  0.20 m                             Systemic Diam: 1.70 cm Jenkins Rouge MD Electronically signed by Jenkins Rouge MD Signature Date/Time: 02/01/2022/12:24:43 PM    Final    CT ANGIO HEAD NECK W WO CM  Result Date:  01/31/2022 CLINICAL DATA:  Follow-up examination for acute stroke. EXAM: CT ANGIOGRAPHY HEAD AND NECK TECHNIQUE: Multidetector CT imaging of the head and neck was performed using the standard protocol during bolus administration of intravenous contrast. Multiplanar CT image reconstructions and MIPs were obtained to evaluate the vascular anatomy. Carotid stenosis measurements (when applicable) are obtained  utilizing NASCET criteria, using the distal internal carotid diameter as the denominator. RADIATION DOSE REDUCTION: This exam was performed according to the departmental dose-optimization program which includes automated exposure control, adjustment of the mA and/or kV according to patient size and/or use of iterative reconstruction technique. CONTRAST:  68m OMNIPAQUE IOHEXOL 350 MG/ML SOLN COMPARISON:  MRI from earlier the same day. FINDINGS: CT HEAD FINDINGS Brain: Age-related cerebral atrophy with chronic microvascular ischemic disease. Previously identified acute ischemic infarcts involving the posterior right frontoparietal region and occipital lobes, grossly stable. No associated hemorrhage or mass effect. No other acute large vessel territory infarct or hemorrhage. No midline shift. No hydrocephalus or extra-axial fluid collection. Vascular: No hyperdense vessel. Calcified atherosclerosis present at the skull base. Skull: Scalp soft tissues and calvarium within normal limits. Sinuses: Air-fluid level noted within the left maxillary sinus. Mastoid air cells are clear. Orbits: Globes orbital soft tissues demonstrate no acute finding. Review of the MIP images confirms the above findings CTA NECK FINDINGS Aortic arch: Visualized aortic arch normal in caliber with standard branching pattern. Moderate to advanced atheromatous change about the arch itself. No high-grade stenosis about the origin the great vessels. Right carotid system: Right CCA patent without stenosis. Scattered calcified plaque about the right  carotid bulb/proximal right ICA without significant stenosis. Occlusion versus severe stenosis at the origin of the right external carotid artery noted. Distally, there is a small focal filling defect along the posterior wall of the proximal cervical right ICA (series 11, image 160), suspicious for thrombus. Finding favored to reflect changes due to a recently ruptured plaque, although a possible small focal dissection could also be considered. No significant stenosis. Right ICA patent distally without stenosis or other abnormality. Left carotid system: Left CCA patent without stenosis. Bulky calcified plaque about the left carotid bulb/proximal left ICA without hemodynamically significant greater than 50% stenosis. Severe stenosis versus occlusion noted at the left external carotid artery. Cervical left ICA patent distally without stenosis or other acute finding. Vertebral arteries: Both vertebral arteries arise from subclavian arteries. No significant proximal subclavian artery stenosis. Right vertebral artery dominant. Vertebral arteries patent without stenosis or dissection. Skeleton: No discrete or worrisome osseous lesions. Patient is edentulous. Other neck: No other acute soft tissue abnormality within the neck. Upper chest: Hyperinflation with severe emphysema. Visualized upper chest demonstrates no other acute finding. Shotty subcentimeter mediastinal lymph nodes noted, which could be reactive. Review of the MIP images confirms the above findings CTA HEAD FINDINGS Anterior circulation: Petrous segments patent bilaterally. Atheromatous change seen throughout the carotid siphons with associated moderate multifocal narrowing. A1 segments, anterior communicating complex common anterior cerebral arteries widely patent. No M1 stenosis or occlusion. No proximal MCA branch occlusion. Distal MCA branches perfused and symmetric. Posterior circulation: Right vertebral artery dominant and patent to the vertebrobasilar  junction without stenosis. Left vertebral artery hypoplastic. Superimposed atherosclerotic calcification about the proximal left V4 segment with associated moderate multifocal stenoses. Right PICA patent. Left PICA not well seen. Tiny fenestration noted at the vertebrobasilar junction. Basilar patent distally without stenosis. Superior cerebellar arteries patent bilaterally. Both PCAs primarily supplied via the basilar well perfused or distal aspects. Venous sinuses: Patent allowing for timing the contrast bolus. Anatomic variants: None significant.  No aneurysm. Review of the MIP images confirms the above findings IMPRESSION: CT HEAD IMPRESSION: 1. 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 No associated hemorrhage or mass effect. 2. No other new acute intracranial abnormality. 3. Underlying atrophy with chronic  small vessel ischemic disease. CTA HEAD AND NECK IMPRESSION: 1. Small focal filling defect along the posterior wall of the proximal cervical right ICA, suspicious for thrombus. Finding favored to reflect changes due to a recently ruptured plaque, although a possible small focal dissection could also be considered. No significant stenosis. 2. Moderate atheromatous change about the skull base with associated moderate multifocal narrowing about the siphons and about the proximal left V4 segment. No other hemodynamically significant or correctable stenosis. 3. Emphysema (ICD10-J43.9). Electronically Signed   By: Jeannine Boga M.D.   On: 01/31/2022 22:32   MR BRAIN WO CONTRAST  Result Date: 01/31/2022 CLINICAL DATA:  Acute neuro deficit. EXAM: MRI HEAD WITHOUT CONTRAST TECHNIQUE: Multiplanar, multiecho pulse sequences of the brain and surrounding structures were obtained without intravenous contrast. COMPARISON:  None Available. FINDINGS: Brain: Multiple areas of acute infarct are present. Small acute infarcts in the occipital lobe  bilaterally. Patchy areas of acute infarct in the right parietal lobe. No associated hemorrhage. Ventricle size normal. Moderate chronic microvascular ischemic change in the white matter. No mass lesion. Vascular: Normal arterial flow voids in the skull base. Skull and upper cervical spine: No focal abnormality. Sinuses/Orbits: Mucosal edema paranasal sinuses. Left maxillary air-fluid level. Negative orbit Other: None IMPRESSION: Small acute infarcts in the occipital lobe bilaterally. Cluster of acute infarcts in the right parietal lobe. Findings suggest emboli given different vascular distributions. Moderate chronic microvascular ischemic change in the white matter. No acute hemorrhage Electronically Signed   By: Franchot Gallo M.D.   On: 01/31/2022 16:59   DG Chest Port 1 View  Result Date: 01/30/2022 CLINICAL DATA:  A 66 year old female presents with cough and dyspnea. EXAM: PORTABLE CHEST 1 VIEW COMPARISON:  February 2022. FINDINGS: EKG leads project over the chest. Signs of loss of subcutaneous fat since previous imaging, cachectic appearing on the current study. Lungs are hyperinflated. Cardiomediastinal contours and hilar structures are stable. Patchy interstitial and airspace disease with subtle nodular component in the LEFT mid and lower chest. No visible pneumothorax. No gross signs of pleural effusion. On limited assessment there is no acute skeletal process. IMPRESSION: 1. Patchy interstitial and airspace disease with subtle nodular component in the LEFT mid and lower chest, findings are suspicious for pneumonia superimposed on pulmonary emphysema. Given nodular component would suggest follow-up PA and lateral chest after resolution of symptoms or following therapy as appropriate. 2. Loss of subcutaneous fat compared to previous imaging is dramatic. 3. COPD. Electronically Signed   By: Zetta Bills M.D.   On: 01/30/2022 10:09    Microbiology: Recent Results (from the past 240 hour(s))  Culture,  blood (Routine X 2) w Reflex to ID Panel     Status: None   Collection Time: 01/30/22 12:15 PM   Specimen: BLOOD  Result Value Ref Range Status   Specimen Description   Final    BLOOD BLOOD LEFT FOREARM Performed at [facility] [address], [place], [place] [address]    Special Requests   Final    BOTTLES DRAWN AEROBIC AND ANAEROBIC Blood Culture results may not be optimal due to an inadequate volume of blood received in culture bottles Performed at [facility] [address], [place], [place] [address]    Culture   Final    NO GROWTH 5 DAYS Performed at [facility], [place] [address], [place], [place] [address]    Report Status 02/04/2022 FINAL  Final  Culture, blood (Routine X 2) w Reflex  to ID Panel     Status: None   Collection Time: 01/30/22 12:17 PM   Specimen: BLOOD  Result Value Ref Range Status   Specimen Description   Final    BLOOD LEFT ANTECUBITAL Performed at [facility] [address] [place], [place] [address]    Special Requests   Final    BOTTLES DRAWN AEROBIC ONLY Blood Culture adequate volume Performed at [facility] [address], [place], [place] [address]    Culture   Final    NO GROWTH 5 DAYS Performed at [facility], [place] [address], [place], [place] [address]    Report Status 02/04/2022 FINAL  Final  Respiratory (~20 pathogens) panel by PCR     Status: Abnormal   Collection Time: 01/30/22 12:20 PM   Specimen: Nasopharyngeal Swab; Respiratory  Result Value Ref Range Status   Adenovirus NOT DETECTED NOT DETECTED Final   Coronavirus 229E NOT DETECTED NOT DETECTED Final    Comment: (NOTE) The Coronavirus on the Respiratory Panel, DOES NOT test for the novel  Coronavirus (2019 nCoV)    Coronavirus HKU1 NOT DETECTED NOT DETECTED Final   Coronavirus NL63 NOT DETECTED NOT DETECTED Final   Coronavirus OC43 NOT DETECTED NOT DETECTED Final   Metapneumovirus  NOT DETECTED NOT DETECTED Final   Rhinovirus / Enterovirus NOT DETECTED NOT DETECTED Final   Influenza A NOT DETECTED NOT DETECTED Final   Influenza B NOT DETECTED NOT DETECTED Final   Parainfluenza Virus 1 NOT DETECTED NOT DETECTED Final   Parainfluenza Virus 2 NOT DETECTED NOT DETECTED Final   Parainfluenza Virus 3 NOT DETECTED NOT DETECTED Final   Parainfluenza Virus 4 DETECTED (A) NOT DETECTED Final   Respiratory Syncytial Virus NOT DETECTED NOT DETECTED Final   Bordetella pertussis NOT DETECTED NOT DETECTED Final   Bordetella Parapertussis NOT DETECTED NOT DETECTED Final   Chlamydophila pneumoniae NOT DETECTED NOT DETECTED Final   Mycoplasma pneumoniae NOT DETECTED NOT DETECTED Final    Comment: Performed at [facility], [place] [address] [place], [place] [address]  MRSA Next Gen by PCR, Nasal     Status: None   Collection Time: 01/30/22 12:20 PM   Specimen: Nasopharyngeal Swab; Nasal Swab  Result Value Ref Range Status   MRSA by PCR Next Gen NOT DETECTED NOT DETECTED Final    Comment: (NOTE) The GeneXpert MRSA Assay (FDA approved for NASAL specimens only), is one component of a comprehensive MRSA colonization surveillance program. It is not intended to diagnose MRSA infection nor to guide or monitor treatment for MRSA infections. Test performance is not FDA approved in patients less than 56 years old. Performed at [facility], [place] [address] [place], [place] [address]      Labs: Basic Metabolic Panel: Recent Labs  Lab 02/02/22 0640 02/06/22 0511 02/07/22 0612  NA 141  --  143  K 4.8  --  4.2  CL 103  --  103  CO2 36*  --  36*  GLUCOSE 82  --  92  BUN 30*  --  29*  CREATININE 0.52 0.49 0.44  CALCIUM 9.1  --  9.0  MG 2.4  --  2.1  PHOS 2.9  --  2.5   Liver Function Tests: Recent Labs  Lab 02/07/22 0612  AST 31  ALT 28  ALKPHOS 48  BILITOT 0.4  PROT 6.0*  ALBUMIN 2.7*   No results for input(s): "LIPASE", "AMYLASE" in the last 168  hours. No results for input(s): "AMMONIA" in the last 168 hours. CBC: Recent Labs  Lab 02/02/22 0640 02/07/22 0612  WBC 11.5* 10.5  NEUTROABS 10.1*  --   HGB 13.1 13.4  HCT 42.0 42.7  MCV 97.2 96.6  PLT 169 180   Cardiac Enzymes: No results for input(s): "CKTOTAL", "CKMB", "CKMBINDEX", "TROPONINI" in the last 168 hours. BNP: BNP (last 3 results) No results for input(s): "BNP" in the last 8760 hours.  ProBNP (last 3 results) No results for input(s): "PROBNP" in the last 8760 hours.  CBG: No results for input(s): "GLUCAP" in the last 168 hours.     Signed:  Dia Crawford, MD [facility]

## 2022-02-07 NOTE — Telephone Encounter (Signed)
Scheduled consult (pt needs to complete DPR for our office) on 9/14 with NM. Reminder mailed and appt should be printed on discharge papers as well.

## 2022-02-07 NOTE — Progress Notes (Signed)
Brief Neuro Update:  Lower ext dopplers with no DVTs.  No anticoagulation, unless malignancy is confirmed. From a stroke standpoint, recommend discharge on Aspirin '81mg'$  daily along with plavix '75mg'$  daily for 21 days, followed by Aspirin '81mg'$  daily alone. Outpatient loop recorder ordered.  We will signoff. She should follow up with Dr. Antony Contras with [facility] in about 3 months.

## 2022-02-07 NOTE — TOC Transition Note (Signed)
Transition of Care [facility]) - CM/SW Discharge Note   Patient Details  Name: Raven Torres MRN: [id number] Date of Birth: 10-18-1955  [facility] CM/SW Contact:  Vassie Moselle, LCSW Phone Number: 02/07/2022, 1:21 PM   Clinical Narrative:    Pt to transfer to [facility] room 101. Nurses to call report to [phone]. [facility] has been called. Pt's sister [name] has been made aware of transfer.    Final next level of care: Skilled Nursing Facility Barriers to Discharge: Barriers Resolved   Patient Goals and CMS Choice Patient states their goals for this hospitalization and ongoing recovery are:: To return home   Choice offered to / list presented to : Patient, Sibling  Discharge Placement PASRR number recieved: 02/01/22            Patient [facility] bed at: [facility] Patient to be transferred to facility by: [facility] Name of family member notified: [name] Patient and family notified of of transfer: 02/07/22  Discharge Plan and Services In-house Referral: Clinical Social Work Discharge Planning Services: CM Consult Post Acute Care Choice: [place]          DME Arranged: N/A DME Agency: NA                  Social Determinants of Health (SDOH) Interventions     Readmission Risk Interventions    02/07/2022    1:20 PM 02/05/2022   10:01 AM  Readmission Risk Prevention Plan  Post Dischage Appt  Complete  Medication Screening  Complete  Transportation Screening Complete Complete  PCP or Specialist Appt within 5-7 Days Complete   Home Care Screening Complete   Medication Review (RN CM) Complete

## 2022-02-07 NOTE — Progress Notes (Signed)
SATURATION QUALIFICATIONS: (This note is used to comply with regulatory documentation for home oxygen)  Patient Saturations on Room Air at Rest = 94%  Patient Saturations on Room Air while Ambulating = 89%  Patient Saturations on 2 Liters of oxygen while Ambulating = 97%  Please briefly explain why patient needs home oxygen:

## 2022-02-08 DIAGNOSIS — E785 Hyperlipidemia, unspecified: Secondary | ICD-10-CM | POA: Diagnosis not present

## 2022-02-08 DIAGNOSIS — J441 Chronic obstructive pulmonary disease with (acute) exacerbation: Secondary | ICD-10-CM | POA: Diagnosis not present

## 2022-02-08 DIAGNOSIS — M6281 Muscle weakness (generalized): Secondary | ICD-10-CM | POA: Diagnosis not present

## 2022-02-11 DIAGNOSIS — E43 Unspecified severe protein-calorie malnutrition: Secondary | ICD-10-CM | POA: Diagnosis not present

## 2022-02-11 DIAGNOSIS — I69354 Hemiplegia and hemiparesis following cerebral infarction affecting left non-dominant side: Secondary | ICD-10-CM | POA: Diagnosis not present

## 2022-02-11 DIAGNOSIS — R531 Weakness: Secondary | ICD-10-CM | POA: Diagnosis not present

## 2022-02-11 DIAGNOSIS — R110 Nausea: Secondary | ICD-10-CM | POA: Diagnosis not present

## 2022-02-11 DIAGNOSIS — J984 Other disorders of lung: Secondary | ICD-10-CM | POA: Diagnosis not present

## 2022-02-11 DIAGNOSIS — I5031 Acute diastolic (congestive) heart failure: Secondary | ICD-10-CM | POA: Diagnosis not present

## 2022-02-11 DIAGNOSIS — J441 Chronic obstructive pulmonary disease with (acute) exacerbation: Secondary | ICD-10-CM | POA: Diagnosis not present

## 2022-02-11 DIAGNOSIS — K219 Gastro-esophageal reflux disease without esophagitis: Secondary | ICD-10-CM | POA: Diagnosis not present

## 2022-02-11 DIAGNOSIS — G4700 Insomnia, unspecified: Secondary | ICD-10-CM | POA: Diagnosis not present

## 2022-02-12 ENCOUNTER — Encounter: Payer: Self-pay | Admitting: Hematology and Oncology

## 2022-02-12 DIAGNOSIS — M6281 Muscle weakness (generalized): Secondary | ICD-10-CM | POA: Diagnosis not present

## 2022-02-12 DIAGNOSIS — J441 Chronic obstructive pulmonary disease with (acute) exacerbation: Secondary | ICD-10-CM | POA: Diagnosis not present

## 2022-02-13 ENCOUNTER — Other Ambulatory Visit: Payer: Self-pay | Admitting: *Deleted

## 2022-02-13 NOTE — Patient Outreach (Signed)
Per [place] eligible member currently resides in [facility].  Screened for potential care coordination/care management services as a benefit of Raven Torres's [employer], [employer].  Raven Torres admitted to SNF on 02/07/22  after hospitalization.  Facility site visit to [facility] skilled nursing facility. Met with [name] at bedside to discuss potential [facility] care coordination needs. Raven Torres states her sister is working on her Artist. Advised Probation officer to contact her sister Raven Torres at [phone] to discuss further. States sister Raven Torres is now her HCPOA.  Will follow up with [facility] regarding potential [facility] needs.    Raven Rolling, MSN, Raven Torres,Raven Torres [facility] Acute Care Coordinator [phone] [facility]) [phone]  (Toll free office)

## 2022-02-14 DIAGNOSIS — I69354 Hemiplegia and hemiparesis following cerebral infarction affecting left non-dominant side: Secondary | ICD-10-CM | POA: Diagnosis not present

## 2022-02-14 DIAGNOSIS — E43 Unspecified severe protein-calorie malnutrition: Secondary | ICD-10-CM | POA: Diagnosis not present

## 2022-02-14 DIAGNOSIS — R531 Weakness: Secondary | ICD-10-CM | POA: Diagnosis not present

## 2022-02-14 DIAGNOSIS — J441 Chronic obstructive pulmonary disease with (acute) exacerbation: Secondary | ICD-10-CM | POA: Diagnosis not present

## 2022-02-14 DIAGNOSIS — J984 Other disorders of lung: Secondary | ICD-10-CM | POA: Diagnosis not present

## 2022-02-14 DIAGNOSIS — I5031 Acute diastolic (congestive) heart failure: Secondary | ICD-10-CM | POA: Diagnosis not present

## 2022-02-15 DIAGNOSIS — R609 Edema, unspecified: Secondary | ICD-10-CM | POA: Diagnosis not present

## 2022-02-15 DIAGNOSIS — K1230 Oral mucositis (ulcerative), unspecified: Secondary | ICD-10-CM | POA: Diagnosis not present

## 2022-02-18 DIAGNOSIS — I69354 Hemiplegia and hemiparesis following cerebral infarction affecting left non-dominant side: Secondary | ICD-10-CM | POA: Diagnosis not present

## 2022-02-18 DIAGNOSIS — E43 Unspecified severe protein-calorie malnutrition: Secondary | ICD-10-CM | POA: Diagnosis not present

## 2022-02-18 DIAGNOSIS — J441 Chronic obstructive pulmonary disease with (acute) exacerbation: Secondary | ICD-10-CM | POA: Diagnosis not present

## 2022-02-18 DIAGNOSIS — J984 Other disorders of lung: Secondary | ICD-10-CM | POA: Diagnosis not present

## 2022-02-18 DIAGNOSIS — R531 Weakness: Secondary | ICD-10-CM | POA: Diagnosis not present

## 2022-02-18 DIAGNOSIS — I5031 Acute diastolic (congestive) heart failure: Secondary | ICD-10-CM | POA: Diagnosis not present

## 2022-02-19 DIAGNOSIS — K219 Gastro-esophageal reflux disease without esophagitis: Secondary | ICD-10-CM | POA: Diagnosis not present

## 2022-02-19 DIAGNOSIS — K1230 Oral mucositis (ulcerative), unspecified: Secondary | ICD-10-CM | POA: Diagnosis not present

## 2022-02-19 DIAGNOSIS — R609 Edema, unspecified: Secondary | ICD-10-CM | POA: Diagnosis not present

## 2022-02-19 DIAGNOSIS — G4700 Insomnia, unspecified: Secondary | ICD-10-CM | POA: Diagnosis not present

## 2022-02-20 ENCOUNTER — Non-Acute Institutional Stay: Payer: Medicare Other | Admitting: Internal Medicine

## 2022-02-20 ENCOUNTER — Other Ambulatory Visit: Payer: Self-pay | Admitting: *Deleted

## 2022-02-20 ENCOUNTER — Encounter: Payer: Self-pay | Admitting: Internal Medicine

## 2022-02-20 ENCOUNTER — Telehealth: Payer: Self-pay | Admitting: Internal Medicine

## 2022-02-20 ENCOUNTER — Encounter: Payer: Self-pay | Admitting: Hematology and Oncology

## 2022-02-20 VITALS — BP 123/65 | HR 71 | Temp 97.5°F | Resp 18 | Wt 93.0 lb

## 2022-02-20 DIAGNOSIS — Z789 Other specified health status: Secondary | ICD-10-CM

## 2022-02-20 DIAGNOSIS — E43 Unspecified severe protein-calorie malnutrition: Secondary | ICD-10-CM | POA: Diagnosis not present

## 2022-02-20 DIAGNOSIS — J690 Pneumonitis due to inhalation of food and vomit: Secondary | ICD-10-CM | POA: Diagnosis not present

## 2022-02-20 DIAGNOSIS — R918 Other nonspecific abnormal finding of lung field: Secondary | ICD-10-CM | POA: Diagnosis not present

## 2022-02-20 DIAGNOSIS — Z515 Encounter for palliative care: Secondary | ICD-10-CM | POA: Diagnosis not present

## 2022-02-20 DIAGNOSIS — F17200 Nicotine dependence, unspecified, uncomplicated: Secondary | ICD-10-CM | POA: Diagnosis not present

## 2022-02-20 DIAGNOSIS — C321 Malignant neoplasm of supraglottis: Secondary | ICD-10-CM

## 2022-02-20 NOTE — Telephone Encounter (Signed)
Raven Torres from [facility] called needing an appointment for patient, 14 days from discharge. Patient will be discharged on Saturday, July 21. No appointments were available. She would like a call back please to see if we can get this patient in.

## 2022-02-20 NOTE — Progress Notes (Signed)
[facility] Palliative Care Consult Note Telephone: [phone]  Fax: [phone]   Date of encounter: 02/20/22 9:30 AM PATIENT NAME: Raven Torres [address] [name] [address]   [phone] (home)  DOB: 1956/01/05 MRN: [id number] PRIMARY CARE PROVIDER:    Ladell Pier, MD,  [address] [place] [address] [phone]  REFERRING PROVIDER:   [facility]. [facility]  RESPONSIBLE PARTY:    Contact Information     Name Relation Home Work Mobile   Raven Torres Sister   [phone]   Raven Torres Daughter [phone]  [phone]   Raven Torres [phone]  [phone]        I met face to face with patient and family in [facility] skilled nursing facility. Palliative Care was asked to follow this patient by consultation request of  Dr. Sherril Torres to address advance care planning and complex medical decision making. This is the initial visit.                                     ASSESSMENT AND PLAN / RECOMMENDATIONS:   Advance Care Planning/Goals of Care: Goals include to maximize quality of life and symptom management. Patient/health care surrogate gave his/her permission to discuss.Our advance care planning conversation included a discussion about:    The value and importance of advance care planning  Experiences with loved ones who have been seriously ill or have died  Exploration of personal, cultural or spiritual beliefs that might influence medical decisions  Exploration of goals of care in the event of a sudden injury or illness  Identification  of a healthcare agent--sister, Raven Torres [phone]  Review and updating or creation of an  advance directive document . Decision not to resuscitate or to de-escalate disease focused treatments due to poor prognosis. CODE STATUS:  DNR  Symptom Management/Plan: 1. Aspiration pneumonia due to gastric secretions, unspecified laterality,  unspecified part of lung ([place]) -resolved with abx -at risk for recurrence, provided education about this, aspiration precautions, was receiving ST here at [facility] and recommend continued [place]  2. Multiple lung nodules on CT -noted during admission--?primary or secondary, for f/u imaging over time  3. 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 Protein-calorie malnutrition, severe -she lost weight down to 80 lbs from 99 lbs while living with her daughter previously as she did not have adequate meals available--I've asked our social work to get involved due to this need and to help with level of care--ideally she should be in ALF but this had not been arranged at facility  5. Tobacco dependence -ongoing prior to admission; however, she hopes to continue cessation at home  6. Decreased independence with activities of daily living -definitely needs assistance with cleaning and cooking, also some with bathing and dressing due to her left hand weakness from the stroke she had -pt's POA/sister notes that pt's daughter will not be able to help her due to her own mental illness  -pt does not want to move in with her sister b/c she doesn't want to live [place] where her grandson lives (with her dtr) -FL-2 being completed to get into local ALF -if she goes home first, needs HH PT, OT, ST and caregiver (?PCS)  7.  Palliative care by specialist -we will follow pt at home -she was not ready mentally for hospice care though her weight loss appears to qualify her  Follow up Palliative Care Visit: Palliative care will continue to follow for complex medical decision making, advance care planning, and clarification of goals. Follow-up at home upon discharge.  This visit was coded based on medical decision making (MDM).  48 minutes spent on ACP b/w  discussions with patient, MOST completion done, and discussion about palliative, disposition and our social worker to make such arrangements to accommodate pt goals.  PPS: 40%  HOSPICE ELIGIBILITY/DIAGNOSIS: yes/abnormal wt loss  Chief Complaint: initial palliative consult at snf  HISTORY OF PRESENT ILLNESS:  Raven Torres is a 66 y.o. 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  She has some chronic dyspnea, but had begun coughing up more sputum, feeling warm, and weak.  She'd admitted to prior aspiration concerns amid her XRT for her throat cancer.  ED notes indicated cachexia and ill appearance, respiratory distress (NRB mask in use), wheezing, rhonchi, tachypnea and tachycardia.  She had mild leukocytosis, hypernatremia and CXR showed LLL and ML hazy infiltrates superimposed over nodular infiltrates.  She was admitted and treated for CAP and COPD exacerbation with empiric abx, solumedrol and duonebs.  In terms of her cancer, her daughter reported very poor intake x several months with significant weight loss.  She declined PEG feeding previously (another note says she had one at one time?).  Speech therapy was consulted during hospitalization and she was placed on a dysphagia 3/mechanical soft diet with thin liquids via straw, meds crushed in puree, self feeding with small bites and sips and following solids with liquid, use of chin tuck.  She was rehydrated.  Her stay was complicated by an acute right parietal suspected to be embolic stroke event due to hypercoagulability--she had left wrist weakness. .  CTA showed possible proximal right  ICA thrombus and extensive atheromatous changes with multifocal narrowing.  TEE could not be performed, but TTE showed EF 60-65% with grade 2 diastolic dysfunction.    She was treated with baby asa until eliquis began 02/06/22.  Notes from the hospital also indicate she had a stage 1 coccygeal pressure injury noted 6/28.  She was seen by the palliative care team at the hospital.  These notes indicate patient was experiencing some forgetfulness prior to the admission, but was markedly different since the admission (?stroke, steroids/other causes of delirium superimposed).  They discussed long-term care including ALF if patient improved while at SNF vs SNF if she did not.  Hospice was also touched upon there.    She is now on a liberalized diet with thin liquids and ensure high protein supplement shakes as much as 5 times per day.  She continues to receive PT, OT, ST.  Nursing notes indicated she's been alert and oriented x 2 with them.    Weight has oscillated:  89.6 lbs on 7/7 and 93 lbs 7/17, but pitting edema of lower extremities was mentioned.    History obtained from review of EMR, discussion with primary team, and interview with family, facility staff/caregiver and/or Ms.  Wellborn.  I reviewed available labs, medications, imaging, studies and related documents from the EMR.  Records reviewed and summarized above.   ROS Review of Systemssee hpi   Physical Exam: Vitals:   02/20/22 1555  BP: 123/65  Pulse: 71  Resp: 18  Temp: (!) 97.5 F (36.4 C)  SpO2: 90%   Filed Weights   02/20/22 1555  Weight: 93 lb (42.2 kg)    Body mass index is 15.96 kg/m.  Physical Exam Constitutional:      Appearance: She is ill-appearing.     Comments: Frail   HENT:     Head: Normocephalic and atraumatic.  Neck:     Comments: Hoarseness chronically Cardiovascular:     Rate and Rhythm: Normal rate and regular rhythm.  Pulmonary:     Effort: Pulmonary effort is normal.     Breath sounds: Rhonchi  present.  Abdominal:     General: Bowel sounds are normal.  Musculoskeletal:        General: Normal range of motion.     Comments: 4/5 strength in grip of left vs wnl for other extremities, using cane to ambulate though doing minimally here and requiring some help to bathe and dress  Neurological:     General: No focal deficit present.     Mental Status: She is alert and oriented to person, place, and time.     CURRENT PROBLEM LIST:  Patient Active Problem List   Diagnosis Date Noted   Multiple lung nodules on CT 02/06/2022   Infection due to parainfluenza virus 4 02/06/2022   Acute respiratory failure with hypoxia ([place]) 02/02/2022   Acute ischemic stroke ([place]) 02/01/2022   Pressure injury of skin 01/31/2022   Pneumonia 01/30/2022   Protein-calorie malnutrition, severe 09/13/2020   Hypotension 09/12/2020   Tobacco dependence 08/11/2020   Malignant neoplasm of supraglottis ([place]) 07/13/2020   Herpes zoster without complication 22/97/9892   GOLD COPD III B 04/23/2018   Pruritic dermatitis 04/23/2018   High cholesterol 06/24/2017   Shortness of breath 06/04/2017   Coronary artery calcification 06/04/2017   Other fatigue 06/04/2017   Centrilobular emphysema ([place]) 05/15/2017   Atherosclerosis of arteries 05/15/2017   Poor memory 05/15/2017   Cigarette nicotine dependence without complication 11/94/1740   Lung nodules 04/16/2017   TB lung, 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/22/2017   Other cirrhosis of liver ([place]) 01/22/2017   Long-term use of high-risk medication 12/31/2016   Rheumatoid arthritis involving multiple sites with positive rheumatoid factor ([place]) 12/31/2016   Diabetes mellitus screening 11/19/2016   Fibromyalgia 05/15/2015   Rheumatoid arthritis ([place]) 08/22/2014   HTN (hypertension) 08/22/2014   Tobacco abuse 08/22/2014   Hepatic cirrhosis ([place]) 05/31/2014   Chronic hepatitis C virus infection ([place]) 05/03/2014   PAST MEDICAL HISTORY:  Active Ambulatory Problems    Diagnosis Date Noted    Chronic hepatitis C virus infection ([place]) 05/03/2014   Hepatic cirrhosis ([place]) 05/31/2014   Rheumatoid arthritis ([place]) 08/22/2014   HTN (hypertension) 08/22/2014   Tobacco abuse 08/22/2014   Fibromyalgia 05/15/2015   Diabetes mellitus screening 11/19/2016   TB lung, latent 01/22/2017   Lung nodules 04/16/2017   Centrilobular emphysema ([place]) 05/15/2017   Atherosclerosis of arteries 05/15/2017   Poor memory 05/15/2017   Shortness of breath 06/04/2017   Coronary artery calcification 06/04/2017   Other fatigue 06/04/2017   High cholesterol 06/24/2017   GOLD COPD III B 04/23/2018   Pruritic dermatitis 04/23/2018   Herpes zoster without complication 81/44/8185   Malignant neoplasm of supraglottis ([place]) 07/13/2020  Tobacco dependence 08/11/2020   Hypotension 09/12/2020   Protein-calorie malnutrition, severe 09/13/2020   Long-term use of high-risk medication 12/31/2016   Other cirrhosis of liver ([place]) 01/22/2017   Rheumatoid arthritis involving multiple sites with positive rheumatoid factor ([place]) 12/31/2016   Cigarette nicotine dependence without complication 88/82/8003   Pneumonia 01/30/2022   Pressure injury of skin 01/31/2022   Acute ischemic stroke ([place]) 02/01/2022   Acute respiratory failure with hypoxia ([place]) 02/02/2022   Multiple lung nodules on CT 02/06/2022   Infection due to parainfluenza virus 4 02/06/2022   Resolved Ambulatory Problems    Diagnosis Date Noted   Elevated BP 08/23/2014   Past Medical History:  Diagnosis Date   Arthritis    Cataract    Colitis    COPD (chronic obstructive pulmonary disease) ([place])    Hepatitis C    Hyperlipidemia    Hypertension    Shingles 03/27/2020   TB (tuberculosis)    SOCIAL HX:  Social History   Tobacco Use   Smoking status: Every Day    Packs/day: 1.00    Years: 51.00    Total pack years: 51.00    Types: Cigarettes    Start date: 25   Smokeless tobacco: Never   Tobacco comments:    4 cigs a day   Substance  Use Topics   Alcohol use: No    Alcohol/week: 0.0 standard drinks of alcohol   FAMILY HX:  Family History  Problem Relation Age of Onset   Heart disease Mother    Cancer Mother    Melanoma Mother    Esophageal cancer Father    Colon cancer Neg Hx    Colon polyps Neg Hx    Rectal cancer Neg Hx    Stomach cancer Neg Hx       ALLERGIES:  Allergies  Allergen Reactions   Tudorza Pressair [Aclidinium Bromide] Itching   Amitriptyline Other (See Comments)    Eye spasms per pt   Codeine Nausea Only   Tramadol Nausea Only     PERTINENT MEDICATIONS:   MED NYSTATIN 100,000 UNIT/ML SUSP- ( Magic Mouthwash) Swish and spit 5 ml by mouth every 6 hours x 7 days for mouth pain DeMarchi, William 02/15/2022 12:24 PM MED ASPIRIN 81 MG CHEWABLE TABLET:Chew 1 tablet by mouth daily DeMarchi, William 02/15/2022 10:11 AM MED MELATONIN 3 MG TABLET TAKE 1 TABLET BY MOUTH AT BEDTIME FOR SLEEP DeMarchi, William 02/12/2022 8:00 PM MED CALCIUM ANTACID 500 MG CHW TAB TAKE 2 TABLETS (1000MG) BY MOUTH EVERY 6 HOURS AS NEEDED FOR GERD DeMarchi, William 02/12/2022 11:00 AM MED ONDANSETRON HCL 4 MG TABLET TAKE 1 TABLET BY MOUTH EVERY 6 HOURS AS NEEDED FOR NAUSEA DeMarchi, William 02/12/2022 11:00 AM MED MUCUS RELIEF ER 600 MG TABLET TAKE 1 TABLET BY MOUTH TWICE A DAY FOR CONGESTION (DO NOT CRUSH) DeMarchi, William 02/11/2022 8:00 PM MED ATORVASTATIN 40 MG TABLET TAKE 1 TABLET BY MOUTH AT BEDTIME DeMarchi, William 02/08/2022 8:00 PM MED LIDOCAINE 2% VISCOUS SOLN (MIX 15ML OF LIDOCAINE WITH 15ML OF WATER) TAKE 10 ML BY MOUTH OF DILUTED MIXTURE BEFORE EACH MEAL AT BEDTIME DeMarchi, William 02/08/2022 12:00 PM MED ALBUTEROL SUL 2.5 MG/3 ML SOLN INHALE 1 VIAL VIA NEBULIZER EVERY 6 HOURS AS NEEDED FOR WHEEZING OR SHORTNESS OF BREATH DeMarchi, William 02/08/2022 10:00 AM MED ALBUTEROL HFA 90 MCG INHALER INHALE 2 PUFFS BY MOUTH EVERY 6 HOURS AS NEEDED WHEEZING OR SHORTNESS OF BREATH DeMarchi,  William 02/08/2022 10:00 AM MED GERI-TUSSIN DM 200-20 MG/10 ML  TAKE 5 ML BY MOUTH EVERY 4 HOURS AS NEEDED FOR COUGH DeMarchi, William 02/08/2022 10:00 AM MED Constipation (3 of 4): If not relieved by Biscodyl suppository, give disposable Saline Enema rectally X 1 dose/24 hrs as needed (Do not use constipation standing orders for residents with renal failure/CFR less than 30. Contact MD for orders)(Physician Or Madison Hickman 02/07/2022 3:26 PM MED Constipation (1 of 4): If no BM in 3 days, give 30 cc Milk of Magnesium p.o. x 1 dose in 24 hours as needed (Do not use standing constipation orders for residents with renal failure CFR less than 30. Contact MD for orders) (Physician Order) Madison Hickman 02/07/2022 3:25 PM MED Constipation (2 of 4): If not relieved by MOM, give 10 mg Bisacodyl suppositiory rectally X 1 dose in 24 hours as needed (Do not use constipation standing orders for residents with renal failure/CFR less than 30. Contact MD for orders) (Physician Order) Madison Hickman 02/07/2022 3:25 PM MED BEVESPI AEROSPHERE INHALER: Inhale 2 puffs into lungs twice times daily for COPD DeMarchi, Gwyndolyn Saxon 02/07/2022 1:57 PM MED ALPRAZOLAM 0.25 MG TABLET:Give 1 tablet by mouth twice daily as needed for anxiety x 14 days DeMarchi, Gwyndolyn Saxon 02/07/2022 1:50 PM  Thank you for the opportunity to participate in the care of Ms. Muench.  The palliative care team will continue to follow. Please call our office at [phone] if we can be of additional assistance.   Thresea Doble, DO ,   COVID-19 PATIENT SCREENING TOOL Asked and negative response unless otherwise noted:  Have you had symptoms of covid, tested positive or been in contact with someone with symptoms/positive test in the past 5-10 days?

## 2022-02-20 NOTE — Patient Outreach (Signed)
THN Post- Acute Care Coordinator follow up. Per [place] eligible member currently resides in  [facility].  Screening for potential [facility] care management/care coordination services as a benefit of [employer] plan and PCP.  Facility site visit to [facility] skilled nursing facility. Met with Marita Kansas, SNF SW. Marita Kansas reports Mrs. Kopke will transition home with daughter [name]. Marita Kansas states she will make outpatient palliative referral post SNF.  Will have home health services as well.   Spoke with Mrs. Overstreet at bedside. Mrs. Corson confirms she is returning home. States she will return home on Saturday. Advises Probation officer to contact her sister [name] to discuss further plans or needs.   Will plan outreach to Mrs. Bissonette's sister [name].   Marthenia Rolling, MSN, RN,BSN [place] Acute Care Coordinator [phone] [facility]) [phone]  (Toll free office)

## 2022-02-21 ENCOUNTER — Other Ambulatory Visit: Payer: Medicare Other

## 2022-02-21 DIAGNOSIS — I5031 Acute diastolic (congestive) heart failure: Secondary | ICD-10-CM | POA: Diagnosis not present

## 2022-02-21 DIAGNOSIS — R531 Weakness: Secondary | ICD-10-CM | POA: Diagnosis not present

## 2022-02-21 DIAGNOSIS — J984 Other disorders of lung: Secondary | ICD-10-CM | POA: Diagnosis not present

## 2022-02-21 DIAGNOSIS — J441 Chronic obstructive pulmonary disease with (acute) exacerbation: Secondary | ICD-10-CM | POA: Diagnosis not present

## 2022-02-21 DIAGNOSIS — Z515 Encounter for palliative care: Secondary | ICD-10-CM

## 2022-02-21 DIAGNOSIS — E43 Unspecified severe protein-calorie malnutrition: Secondary | ICD-10-CM | POA: Diagnosis not present

## 2022-02-21 DIAGNOSIS — I69354 Hemiplegia and hemiparesis following cerebral infarction affecting left non-dominant side: Secondary | ICD-10-CM | POA: Diagnosis not present

## 2022-02-21 NOTE — Progress Notes (Signed)
COMMUNITY PALLIATIVE CARE SW NOTE  PATIENT NAME: Raven Torres DOB: 1956-07-04 MRN: [id number]  PRIMARY CARE PROVIDER: Ladell Pier, MD  RESPONSIBLE PARTY:  Acct ID - Guarantor Home Phone Work Phone Relationship Acct Type  [id number] - Crabtree,[name] [phone]  Self P/F     [address], [name], [place] [address]   SOCIAL WORK TELEPHONIC VISIT  Per request of Dr. Mariea Clonts, SW completed a follow-up  telephonic call and spoke with Raven Torres's sister-[name]. [name] advised that patient is scheduled to be discharged from [facility] on Saturday. She would like patient to be discharged to and AL. SW provided her two resources for AL's ([place]). [name] called [facility] and they have a bed and she would like to move forward with placement there. SW attempted to call [employer], but she and her counterpart are out today.SW left a message for [place] requesting assistance with completing the FL2 SW consulted with [facility] community Barbette Or to assist with coordinating this possible placement.  *Dr. Mariea Clonts updated.   [address] [place], [place]

## 2022-02-21 NOTE — Telephone Encounter (Signed)
Called Raven Torres back she was not in office yet but front desk took down the information about appt

## 2022-02-22 ENCOUNTER — Non-Acute Institutional Stay: Payer: Self-pay | Admitting: Internal Medicine

## 2022-02-22 DIAGNOSIS — K1230 Oral mucositis (ulcerative), unspecified: Secondary | ICD-10-CM | POA: Diagnosis not present

## 2022-02-22 DIAGNOSIS — C321 Malignant neoplasm of supraglottis: Secondary | ICD-10-CM | POA: Diagnosis not present

## 2022-02-22 DIAGNOSIS — R609 Edema, unspecified: Secondary | ICD-10-CM | POA: Diagnosis not present

## 2022-02-25 ENCOUNTER — Other Ambulatory Visit: Payer: Self-pay

## 2022-02-25 ENCOUNTER — Other Ambulatory Visit: Payer: Self-pay | Admitting: *Deleted

## 2022-02-25 ENCOUNTER — Telehealth: Payer: Self-pay

## 2022-02-25 ENCOUNTER — Other Ambulatory Visit: Payer: Medicare Other

## 2022-02-25 DIAGNOSIS — Z515 Encounter for palliative care: Secondary | ICD-10-CM

## 2022-02-25 DIAGNOSIS — I5031 Acute diastolic (congestive) heart failure: Secondary | ICD-10-CM

## 2022-02-25 NOTE — Patient Outreach (Addendum)
THN Post- Acute Care Coordinator follow up.   Mrs. Raven Torres transitioned from [facility] on 02/23/22. Telephone call made sister Raven Torres (sister/HCPOA) [phone] to discuss potential THN needs. Patient identifiers confirmed. Raven Torres endorses Raven Torres was originally going go to ALF but decided to go home instead. Raven Torres states Raven Torres will have [facility] palliative follow up. Not sure of home health agency name at this time. Writer has reached out to SNF SW to inquire. Will await response.  Raven Torres states Raven Torres is home with her daughter. Explained [place] Management services. Raven Torres denies having any [facility] Care Management needs at this time since Raven Torres will have [place] palliative services and home health. Raven Torres is interested in [facility] mobile meals program and possible transportation needs. Agreeable to writer making referral to [place] for Heart- Friendly mobile meals and possible transportation needs. Raven Torres is primary contact at [phone].  Confirmed address in system is correct.   Will make referral to [place] for Heart-Friendly mobile meals and possible transportation needs as a benefit of [employer].   Addendum 1500: Raven Torres, SNF SW with [facility] indicates [facility] home [facility] was arranged. States PCP follow up with Dr. Wynetta Emery scheduled for 03/11/22 at 1410 pm. Telephone call made to [facility] (sister/HCPOA) to make aware of home health agency.    Raven Rolling, MSN, RN,BSN [place] Acute Care Coordinator [phone] [facility]) [phone]  (Toll free office)

## 2022-02-25 NOTE — Progress Notes (Signed)
COMMUNITY PALLIATIVE CARE SW NOTE  PATIENT NAME: Raven Torres DOB: 24-Oct-1955 MRN: [id number]  PRIMARY CARE PROVIDER: Ladell Pier, MD  RESPONSIBLE PARTY:  Acct ID - Guarantor Home Phone Work Phone Relationship Acct Type  [id number] - Fukuda,FA831 [phone]  Self P/F     [address], [name],  [address]   SOCIAL WORK TELEPHONIC ENCOUNTER  SW completed FL2 and telephoned patient's sister-[name] to advise and confirm fax number of facility she was seeking to place patient in. [name] advised that patient is no longer going to an assisted living. Patient will remain at home with her daughter with palliative care services. Her daughter advised that she also spoke with [name] who will order mobile meals for patient. Although she was told that therapy services was ordered through [place], she has not heard anything from them. SW provided education to [name] regarding palliative care services and role in patient's care, the support/resources provided and assessment for hospice when appropriate. [name] verbalized understanding. SW provided supportive counseling and active listening to the sister as she discussed patient needs and brief medical history.  SW provided education regarding PCS services, which [name] requested SW assist with the application  of this service. SW to follow-up.  SW will follow-up with PCS application, and provide ongoing support and resources when appropriate.   [address] [place], [place]

## 2022-02-25 NOTE — Telephone Encounter (Signed)
Attempted to contact patient to schedule a Palliative Care consult appointment. No answer left a message to return call.  

## 2022-02-25 NOTE — Telephone Encounter (Signed)
   Telephone encounter was:  Successful.  02/25/2022 Name: Raven Torres MRN: [id number] DOB: 1956/05/24  Raven Torres is a 67 y.o. year old female who is a primary care patient of Ladell Pier, MD . The community resource team was consulted for assistance with  moms meals  Care guide performed the following interventions: Patient provided with information about care guide support team and interviewed to confirm resource needs.Patient and daughter stated patient was discharged from SNF and needing moms meals.  Follow Up Plan:  No further follow up planned at this time. The patient has been provided with needed resources.    [place], Care Management  [phone] [address], [place], [place] [address] Phone: [phone] Email: [email or web address]

## 2022-02-28 DIAGNOSIS — Z8619 Personal history of other infectious and parasitic diseases: Secondary | ICD-10-CM | POA: Diagnosis not present

## 2022-02-28 DIAGNOSIS — Z9181 History of falling: Secondary | ICD-10-CM | POA: Diagnosis not present

## 2022-02-28 DIAGNOSIS — J441 Chronic obstructive pulmonary disease with (acute) exacerbation: Secondary | ICD-10-CM | POA: Diagnosis not present

## 2022-02-28 DIAGNOSIS — Z7982 Long term (current) use of aspirin: Secondary | ICD-10-CM | POA: Diagnosis not present

## 2022-02-28 DIAGNOSIS — Z8521 Personal history of malignant neoplasm of larynx: Secondary | ICD-10-CM | POA: Diagnosis not present

## 2022-02-28 DIAGNOSIS — Z7902 Long term (current) use of antithrombotics/antiplatelets: Secondary | ICD-10-CM | POA: Diagnosis not present

## 2022-02-28 DIAGNOSIS — E43 Unspecified severe protein-calorie malnutrition: Secondary | ICD-10-CM | POA: Diagnosis not present

## 2022-02-28 DIAGNOSIS — G8194 Hemiplegia, unspecified affecting left nondominant side: Secondary | ICD-10-CM | POA: Diagnosis not present

## 2022-02-28 DIAGNOSIS — M1990 Unspecified osteoarthritis, unspecified site: Secondary | ICD-10-CM | POA: Diagnosis not present

## 2022-02-28 DIAGNOSIS — R911 Solitary pulmonary nodule: Secondary | ICD-10-CM | POA: Diagnosis not present

## 2022-02-28 DIAGNOSIS — I10 Essential (primary) hypertension: Secondary | ICD-10-CM | POA: Diagnosis not present

## 2022-02-28 DIAGNOSIS — K529 Noninfective gastroenteritis and colitis, unspecified: Secondary | ICD-10-CM | POA: Diagnosis not present

## 2022-02-28 DIAGNOSIS — F17210 Nicotine dependence, cigarettes, uncomplicated: Secondary | ICD-10-CM | POA: Diagnosis not present

## 2022-02-28 DIAGNOSIS — E785 Hyperlipidemia, unspecified: Secondary | ICD-10-CM | POA: Diagnosis not present

## 2022-03-03 DIAGNOSIS — Z8521 Personal history of malignant neoplasm of larynx: Secondary | ICD-10-CM | POA: Diagnosis not present

## 2022-03-03 DIAGNOSIS — I10 Essential (primary) hypertension: Secondary | ICD-10-CM | POA: Diagnosis not present

## 2022-03-03 DIAGNOSIS — Z9181 History of falling: Secondary | ICD-10-CM | POA: Diagnosis not present

## 2022-03-03 DIAGNOSIS — Z7902 Long term (current) use of antithrombotics/antiplatelets: Secondary | ICD-10-CM | POA: Diagnosis not present

## 2022-03-03 DIAGNOSIS — M1990 Unspecified osteoarthritis, unspecified site: Secondary | ICD-10-CM | POA: Diagnosis not present

## 2022-03-03 DIAGNOSIS — E43 Unspecified severe protein-calorie malnutrition: Secondary | ICD-10-CM | POA: Diagnosis not present

## 2022-03-03 DIAGNOSIS — Z7982 Long term (current) use of aspirin: Secondary | ICD-10-CM | POA: Diagnosis not present

## 2022-03-03 DIAGNOSIS — K529 Noninfective gastroenteritis and colitis, unspecified: Secondary | ICD-10-CM | POA: Diagnosis not present

## 2022-03-03 DIAGNOSIS — E785 Hyperlipidemia, unspecified: Secondary | ICD-10-CM | POA: Diagnosis not present

## 2022-03-03 DIAGNOSIS — G8194 Hemiplegia, unspecified affecting left nondominant side: Secondary | ICD-10-CM | POA: Diagnosis not present

## 2022-03-03 DIAGNOSIS — Z8619 Personal history of other infectious and parasitic diseases: Secondary | ICD-10-CM | POA: Diagnosis not present

## 2022-03-03 DIAGNOSIS — J441 Chronic obstructive pulmonary disease with (acute) exacerbation: Secondary | ICD-10-CM | POA: Diagnosis not present

## 2022-03-03 DIAGNOSIS — R911 Solitary pulmonary nodule: Secondary | ICD-10-CM | POA: Diagnosis not present

## 2022-03-03 DIAGNOSIS — F17210 Nicotine dependence, cigarettes, uncomplicated: Secondary | ICD-10-CM | POA: Diagnosis not present

## 2022-03-04 ENCOUNTER — Encounter: Payer: Self-pay | Admitting: *Deleted

## 2022-03-04 NOTE — Progress Notes (Signed)
Received a request from Dr. Hollace Kinnier for home health PT/OT/ST since patient has discharged home from SNF facility. [facility] has agreed to service patient. Sent home health order to [facility] with [place].

## 2022-03-05 DIAGNOSIS — F17210 Nicotine dependence, cigarettes, uncomplicated: Secondary | ICD-10-CM | POA: Diagnosis not present

## 2022-03-05 DIAGNOSIS — J441 Chronic obstructive pulmonary disease with (acute) exacerbation: Secondary | ICD-10-CM | POA: Diagnosis not present

## 2022-03-05 DIAGNOSIS — Z7982 Long term (current) use of aspirin: Secondary | ICD-10-CM | POA: Diagnosis not present

## 2022-03-05 DIAGNOSIS — K529 Noninfective gastroenteritis and colitis, unspecified: Secondary | ICD-10-CM | POA: Diagnosis not present

## 2022-03-05 DIAGNOSIS — Z7902 Long term (current) use of antithrombotics/antiplatelets: Secondary | ICD-10-CM | POA: Diagnosis not present

## 2022-03-05 DIAGNOSIS — E43 Unspecified severe protein-calorie malnutrition: Secondary | ICD-10-CM | POA: Diagnosis not present

## 2022-03-05 DIAGNOSIS — Z9181 History of falling: Secondary | ICD-10-CM | POA: Diagnosis not present

## 2022-03-05 DIAGNOSIS — E785 Hyperlipidemia, unspecified: Secondary | ICD-10-CM | POA: Diagnosis not present

## 2022-03-05 DIAGNOSIS — M1990 Unspecified osteoarthritis, unspecified site: Secondary | ICD-10-CM | POA: Diagnosis not present

## 2022-03-05 DIAGNOSIS — R911 Solitary pulmonary nodule: Secondary | ICD-10-CM | POA: Diagnosis not present

## 2022-03-05 DIAGNOSIS — I10 Essential (primary) hypertension: Secondary | ICD-10-CM | POA: Diagnosis not present

## 2022-03-05 DIAGNOSIS — Z8521 Personal history of malignant neoplasm of larynx: Secondary | ICD-10-CM | POA: Diagnosis not present

## 2022-03-05 DIAGNOSIS — Z8619 Personal history of other infectious and parasitic diseases: Secondary | ICD-10-CM | POA: Diagnosis not present

## 2022-03-05 DIAGNOSIS — G8194 Hemiplegia, unspecified affecting left nondominant side: Secondary | ICD-10-CM | POA: Diagnosis not present

## 2022-03-07 DIAGNOSIS — R911 Solitary pulmonary nodule: Secondary | ICD-10-CM | POA: Diagnosis not present

## 2022-03-07 DIAGNOSIS — Z7982 Long term (current) use of aspirin: Secondary | ICD-10-CM | POA: Diagnosis not present

## 2022-03-07 DIAGNOSIS — E785 Hyperlipidemia, unspecified: Secondary | ICD-10-CM | POA: Diagnosis not present

## 2022-03-07 DIAGNOSIS — J441 Chronic obstructive pulmonary disease with (acute) exacerbation: Secondary | ICD-10-CM | POA: Diagnosis not present

## 2022-03-07 DIAGNOSIS — M1990 Unspecified osteoarthritis, unspecified site: Secondary | ICD-10-CM | POA: Diagnosis not present

## 2022-03-07 DIAGNOSIS — I10 Essential (primary) hypertension: Secondary | ICD-10-CM | POA: Diagnosis not present

## 2022-03-07 DIAGNOSIS — Z7902 Long term (current) use of antithrombotics/antiplatelets: Secondary | ICD-10-CM | POA: Diagnosis not present

## 2022-03-07 DIAGNOSIS — G8194 Hemiplegia, unspecified affecting left nondominant side: Secondary | ICD-10-CM | POA: Diagnosis not present

## 2022-03-07 DIAGNOSIS — Z8619 Personal history of other infectious and parasitic diseases: Secondary | ICD-10-CM | POA: Diagnosis not present

## 2022-03-07 DIAGNOSIS — F17210 Nicotine dependence, cigarettes, uncomplicated: Secondary | ICD-10-CM | POA: Diagnosis not present

## 2022-03-07 DIAGNOSIS — K529 Noninfective gastroenteritis and colitis, unspecified: Secondary | ICD-10-CM | POA: Diagnosis not present

## 2022-03-07 DIAGNOSIS — Z8521 Personal history of malignant neoplasm of larynx: Secondary | ICD-10-CM | POA: Diagnosis not present

## 2022-03-07 DIAGNOSIS — E43 Unspecified severe protein-calorie malnutrition: Secondary | ICD-10-CM | POA: Diagnosis not present

## 2022-03-07 DIAGNOSIS — Z9181 History of falling: Secondary | ICD-10-CM | POA: Diagnosis not present

## 2022-03-11 ENCOUNTER — Other Ambulatory Visit: Payer: Medicare Other | Admitting: Hospice

## 2022-03-11 ENCOUNTER — Encounter: Payer: Self-pay | Admitting: Internal Medicine

## 2022-03-11 ENCOUNTER — Ambulatory Visit: Payer: Medicare Other | Attending: Internal Medicine | Admitting: Internal Medicine

## 2022-03-11 VITALS — BP 118/76 | HR 86 | Temp 98.1°F | Ht 66.0 in | Wt 96.4 lb

## 2022-03-11 DIAGNOSIS — E43 Unspecified severe protein-calorie malnutrition: Secondary | ICD-10-CM

## 2022-03-11 DIAGNOSIS — Z09 Encounter for follow-up examination after completed treatment for conditions other than malignant neoplasm: Secondary | ICD-10-CM | POA: Diagnosis not present

## 2022-03-11 DIAGNOSIS — I6930 Unspecified sequelae of cerebral infarction: Secondary | ICD-10-CM | POA: Diagnosis not present

## 2022-03-11 DIAGNOSIS — R233 Spontaneous ecchymoses: Secondary | ICD-10-CM | POA: Diagnosis not present

## 2022-03-11 DIAGNOSIS — R918 Other nonspecific abnormal finding of lung field: Secondary | ICD-10-CM | POA: Diagnosis not present

## 2022-03-11 DIAGNOSIS — F419 Anxiety disorder, unspecified: Secondary | ICD-10-CM

## 2022-03-11 DIAGNOSIS — I5030 Unspecified diastolic (congestive) heart failure: Secondary | ICD-10-CM | POA: Diagnosis not present

## 2022-03-11 MED ORDER — ALPRAZOLAM 0.25 MG PO TABS: 0.2500 mg | ORAL_TABLET | Freq: Two times a day (BID) | ORAL | 0 refills | Status: DC | PRN

## 2022-03-11 NOTE — Patient Instructions (Addendum)
I have sent refill on the alprazolam to your pharmacy Once you are done with the clopidogrel, we will not refill that.  Continue with taking the aspirin daily.

## 2022-03-11 NOTE — Progress Notes (Signed)
Patient ID: Raven Torres, female    DOB: 04-04-1956  MRN: [id number]  CC: Hospitalization Follow-up   Subjective: Raven Torres is a 66 y.o. female who presents for hosp f/u Her concerns today include:  Patient with history of supraglottic laryngeal CA (status post chemoradiation with end of treatment PET/CT with complete response), HTN, cured hepatitis C with cirrhosis, adjustment disorder, HL, tobacco dependence, and rheumatoid arthritis/fibromyalgia (Dr. Posey Pronto at [facility]) , memory decline (MMSE 05/2019 was 30/30), COPD GOLD III, lung nodules (Dr. Vaughan Browner), coronary atherosclerosis  Patient hospitalized 6/26 - 02/07/2022 with acute right parietal ischemic infarct, acute respiratory failure with hypoxia secondary to CAP/COPD exacerbation/+ parainfluenza virus and acute diastolic CHF. -Stroke thought to be embolic secondary to hypercoagulability.  She had weakness in the left wrist.  Findings on studies during hospitalization are copied below from hospital discharge: CTA head/neck: Extensive atheromatous changes multifocal narrowing, suspected small filling defect along the posterior wall of the proximal cervical right ICA suspicious for thrombus. -LDL 91.   -Lipitor 40 mg daily - 7/1 hemoglobin A1c= 5.0 -Echo showed EF of 60 to 65% with grade 2 diastolic dysfunction.  Small patent foramen ovale.  Patient is not a good candidate for TEE as per cardiology input and hence TEE has been canceled -Pulmonary vascular team;  Recommend aspirin for now and start full dose Lovenox or Eliquis from February 06, 2022 onwards after discussion with oncology.  -ontinue Lipitor.   -Given that patient's bilateral lower extremity Doppler negative for DVT Aspirin and Plavix for 3 weeks followed by Aspirin '81mg'$  daily alone, per Neurology -Follow-up with [facility] in 8 weeks.  She was treated with antibiotics and DuoNebs for the pneumonia and COPD. Found to have multiple lung nodules/RLL lung  nodule.  Not a good candidate for biopsy.  Patient to follow-up with Dr. Verlee Monte with follow-up CT in 8 weeks.  Severe protein calorie malnutrition persist and coccygeal stage I pressure ulcers that was present on admission.  She received local wound care. Sent to [facility].  Dischg from rehab last wk. Went back home to stay with her daughter and grandchildren Pt feels her memory was affected by stroke and has loss use of her dominant hand.  Getting better with occupational therapy.  She is LT hand.   Home OT and home nurse came out to her house last wk Complains of easy bruising on the upper extremities since being on Plavix and aspirin Seen by Dr. Mariea Clonts 02/20/2022 while at the SNF.  Pt is DNR.  Patient confirms this with me today. She has cut back but still smoking.  Having a lot of anxiety and feeling overwhelm about her overall diagnoses and not being able to do some of the things she would like to do.  Dischg on low dose Alprazolam.  Would like to continue with it or something else to help keep the anxiety level down. Has bottle of Plavix with 4 more pills, empty bottle of Liptior 40 mg and empty bottle of Alprazolam Daughter has been doing the cooking.  She has been getting in 2-3 meals a day and 4-5 cans Ensure a day.    Patient Active Problem List   Diagnosis Date Noted   Multiple lung nodules on CT 02/06/2022   Infection due to parainfluenza virus 4 02/06/2022   Acute respiratory failure with hypoxia ([place]) 02/02/2022   Acute ischemic stroke ([place]) 02/01/2022   Pressure injury of skin 01/31/2022   Pneumonia 01/30/2022   Protein-calorie  malnutrition, severe 09/13/2020   Hypotension 09/12/2020   Tobacco dependence 08/11/2020   Malignant neoplasm of supraglottis ([place]) 07/13/2020   Herpes zoster without complication 53/66/4403   GOLD COPD III B 04/23/2018   Pruritic dermatitis 04/23/2018   High cholesterol 06/24/2017   Shortness of breath 06/04/2017   Coronary artery calcification  06/04/2017   Other fatigue 06/04/2017   Centrilobular emphysema ([place]) 05/15/2017   Atherosclerosis of arteries 05/15/2017   Poor memory 05/15/2017   Cigarette nicotine dependence without complication 47/42/5956   Lung nodules 04/16/2017   TB lung, latent 01/22/2017   Other cirrhosis of liver ([place]) 01/22/2017   Long-term use of high-risk medication 12/31/2016   Rheumatoid arthritis involving multiple sites with positive rheumatoid factor ([place]) 12/31/2016   Diabetes mellitus screening 11/19/2016   Fibromyalgia 05/15/2015   Rheumatoid arthritis ([place]) 08/22/2014   HTN (hypertension) 08/22/2014   Tobacco abuse 08/22/2014   Hepatic cirrhosis ([place]) 05/31/2014   Chronic hepatitis C virus infection ([place]) 05/03/2014     Current Outpatient Medications on File Prior to Visit  Medication Sig Dispense Refill   albuterol (PROVENTIL) (2.5 MG/3ML) 0.083% nebulizer solution Take 3 mLs (2.5 mg total) by nebulization every 6 (six) hours as needed for wheezing or shortness of breath. 150 mL 1   albuterol (VENTOLIN HFA) 108 (90 Base) MCG/ACT inhaler INHALE 2 PUFFS INTO THE LUNGS EVERY 6 HOURS AS NEEDED FOR WHEEZING OR SHORTNESS OF BREATH (Patient taking differently: Inhale 2 puffs into the lungs every 6 (six) hours as needed for wheezing or shortness of breath.) 8.5 g 3   ALPRAZolam (XANAX) 0.25 MG tablet Take 1 tablet (0.25 mg total) by mouth 2 (two) times daily as needed for anxiety. 10 tablet 0   aspirin 81 MG chewable tablet Chew 1 tablet (81 mg total) by mouth daily. 30 tablet 0   atorvastatin (LIPITOR) 40 MG tablet Take 1 tablet (40 mg total) by mouth at bedtime. 30 tablet 0   clopidogrel (PLAVIX) 75 MG tablet Take 1 tablet (75 mg total) by mouth daily. 20 tablet 0   feeding supplement (ENSURE ENLIVE / ENSURE PLUS) LIQD Take 237 mLs by mouth 5 (five) times daily.     Glycopyrrolate-Formoterol (BEVESPI AEROSPHERE) 9-4.8 MCG/ACT AERO Inhale 2 puffs into the lungs 2 (two) times daily. 10.7 g 5    guaiFENesin-dextromethorphan (ROBITUSSIN DM) 100-10 MG/5ML syrup Take 5 mLs by mouth every 4 (four) hours as needed for cough. 118 mL 0   lidocaine (XYLOCAINE) 2 % solution Use as directed 15 mLs in the mouth or throat in the morning, at noon, in the evening, and at bedtime. Patient: Mix 1part 2% viscous lidocaine, 1part H20. Swallow 82m of diluted mixture, 351m before meals and at bedtime, up to QID     No current facility-administered medications on file prior to visit.    Allergies  Allergen Reactions   Tudorza Pressair [Aclidinium Bromide] Itching   Amitriptyline Other (See Comments)    Eye spasms per pt   Codeine Nausea Only   Tramadol Nausea Only    Social History   Socioeconomic History   Marital status: Married    Spouse name: Not on file   Number of children: Not on file   Years of education: Not on file   Highest education level: Not on file  Occupational History   Not on file  Tobacco Use   Smoking status: Every Day    Packs/day: 1.00    Years: 51.00    Total pack years: 51.00  Types: Cigarettes    Start date: 70   Smokeless tobacco: Never   Tobacco comments:    4 cigs a day   Vaping Use   Vaping Use: Never used  Substance and Sexual Activity   Alcohol use: No    Alcohol/week: 0.0 standard drinks of alcohol   Drug use: No   Sexual activity: Not Currently  Other Topics Concern   Not on file  Social History Narrative   Not on file   Social Determinants of Health   Financial Resource Strain: Not on file  Food Insecurity: No Food Insecurity (07/11/2020)   Hunger Vital Sign    Worried About Running Out of Food in the Last Year: Never true    Ran Out of Food in the Last Year: Never true  Transportation Needs: No Transportation Needs (07/11/2020)   PRAPARE - Hydrologist (Medical): No    Lack of Transportation (Non-Medical): No  Physical Activity: Not on file  Stress: Not on file  Social Connections: Not on file   Intimate Partner Violence: Not on file    Family History  Problem Relation Age of Onset   Heart disease Mother    Cancer Mother    Melanoma Mother    Esophageal cancer Father    Colon cancer Neg Hx    Colon polyps Neg Hx    Rectal cancer Neg Hx    Stomach cancer Neg Hx     Past Surgical History:  Procedure Laterality Date   ABDOMINAL HYSTERECTOMY     CATARACT EXTRACTION, BILATERAL     COLONOSCOPY     COLONOSCOPY  04/04/2020   DIRECT LARYNGOSCOPY N/A 06/26/2020   Procedure: DIRECT LARYNGOSCOPY AND BIOPSY;  Surgeon: Rozetta Nunnery, MD;  Location: [facility];  Service: ENT;  Laterality: N/A;   IR GASTROSTOMY TUBE MOD SED  07/27/2020   IR GASTROSTOMY TUBE REMOVAL  04/17/2021   IR IMAGING GUIDED PORT INSERTION  07/27/2020   IR REMOVAL TUN ACCESS W/ PORT W/O FL MOD SED  03/20/2021   KNEE SURGERY Right    POLYPECTOMY  11/17/2008   HPP x 1     ROS: Review of Systems Negative except as stated above  PHYSICAL EXAM: BP 118/76   Pulse 86   Temp 98.1 F (36.7 C) (Oral)   Ht '5\' 6"'$  (1.676 m)   Wt 96 lb 6.4 oz (43.7 kg)   SpO2 96%   BMI 15.56 kg/m   Wt Readings from Last 3 Encounters:  03/11/22 96 lb 6.4 oz (43.7 kg)  02/20/22 93 lb (42.2 kg)  02/07/22 84 lb 3.5 oz (38.2 kg)    Physical Exam  General appearance -frail and chronically ill appearing elderly female.  She is in NAD. Mental status -a little forgetful. Chest -breath sounds decreased at the bases but no wheezes or crackles heard. Heart - normal rate, regular rhythm, normal S1, S2, no murmurs, rubs, clicks or gallops Neurological -she has wasting of muscles in both hands.  Grip on the left is 3/5.  5/5 on the right.  Power proximal and distal upper extremities 5/5 bilaterally. Extremities -no lower extremity edema Skin: Resolving scattered ecchymosis on both arms    03/11/2022    1:47 PM 04/13/2021   10:52 AM 12/11/2020   11:41 AM 08/11/2020    9:02 AM  GAD 7 : Generalized Anxiety Score  Nervous, Anxious, on  Edge 2 0 1 1  Control/stop worrying 2 0 1 0  Worry too  much - different things 2 0 0 0  Trouble relaxing 1 0 0 1  Restless 0 0 0 0  Easily annoyed or irritable 2 0 0 2  Afraid - awful might happen 1 0 0 0  Total GAD 7 Score 10 0 2 4         Latest Ref Rng & Units 02/07/2022    6:12 AM 02/06/2022    5:11 AM 02/02/2022    6:40 AM  CMP  Glucose 70 - 99 mg/dL 92   82   BUN 8 - 23 mg/dL 29   30   Creatinine 0.44 - 1.00 mg/dL 0.44  0.49  0.52   Sodium 135 - 145 mmol/L 143   141   Potassium 3.5 - 5.1 mmol/L 4.2   4.8   Chloride 98 - 111 mmol/L 103   103   CO2 22 - 32 mmol/L 36   36   Calcium 8.9 - 10.3 mg/dL 9.0   9.1   Total Protein 6.5 - 8.1 g/dL 6.0     Total Bilirubin 0.3 - 1.2 mg/dL 0.4     Alkaline Phos 38 - 126 U/L 48     AST 15 - 41 U/L 31     ALT 0 - 44 U/L 28      Lipid Panel     Component Value Date/Time   CHOL 144 02/01/2022 0510   CHOL 133 06/02/2020 0845   TRIG 65 02/01/2022 0510   HDL 40 (L) 02/01/2022 0510   HDL 67 06/02/2020 0845   CHOLHDL 3.6 02/01/2022 0510   VLDL 13 02/01/2022 0510   LDLCALC 91 02/01/2022 0510   LDLCALC 50 06/02/2020 0845    CBC    Component Value Date/Time   WBC 10.5 02/07/2022 0612   RBC 4.42 02/07/2022 0612   HGB 13.4 02/07/2022 0612   HGB 13.5 07/19/2021 1250   HGB 13.8 06/02/2020 0845   HCT 42.7 02/07/2022 0612   HCT 39.7 06/02/2020 0845   PLT 180 02/07/2022 0612   PLT 123 (L) 07/19/2021 1250   PLT 114 (L) 06/02/2020 0845   MCV 96.6 02/07/2022 0612   MCV 92 06/02/2020 0845   MCH 30.3 02/07/2022 0612   MCHC 31.4 02/07/2022 0612   RDW 12.8 02/07/2022 0612   RDW 13.0 06/02/2020 0845   LYMPHSABS 0.6 (L) 02/02/2022 0640   LYMPHSABS 2.9 11/19/2016 1223   MONOABS 0.7 02/02/2022 0640   EOSABS 0.0 02/02/2022 0640   EOSABS 0.2 11/19/2016 1223   BASOSABS 0.0 02/02/2022 0640   BASOSABS 0.0 11/19/2016 1223    ASSESSMENT AND PLAN: 1. Hospital discharge follow-up   2. History of cerebrovascular accident (CVA) with residual  deficit Patient will finish off the follow-up tablets of Plavix and then continue on baby aspirin alone.  Keep upcoming appointment with neurology.  3. Multiple lung nodules on CT Patient has follow-up appointment with pulmonary and repeat CT scan.  However patient states she would not want to have any surgery or biopsy done.  4. Severe protein-calorie malnutrition ([place]) Encouraged her to continue trying to get in her 3 meals a day supplemented with boost or Ensure. - Comprehensive metabolic panel  5. Diastolic congestive heart failure, unspecified HF chronicity (HCC) Stable.  Has upcoming appointment with cardiology. - Comprehensive metabolic panel  6. Anxiety I think it is reasonable to keep her on a low-dose of Xanax to use as needed to help with her anxiety.  [place] controlled substance reporting system reviewed. -  ALPRAZolam (XANAX) 0.25 MG tablet; Take 1 tablet (0.25 mg total) by mouth 2 (two) times daily as needed for anxiety.  Dispense: 30 tablet; Refill: 0  7. Easy bruising Likely due to Plavix. - CBC     Patient was given the opportunity to ask questions.  Patient verbalized understanding of the plan and was able to repeat key elements of the plan.   This documentation was completed using Radio producer.  Any transcriptional errors are unintentional.  No orders of the defined types were placed in this encounter.    Requested Prescriptions    No prescriptions requested or ordered in this encounter    No follow-ups on file.  Karle Plumber, MD, FACP

## 2022-03-12 ENCOUNTER — Institutional Professional Consult (permissible substitution): Payer: Medicare Other | Admitting: Internal Medicine

## 2022-03-12 ENCOUNTER — Encounter: Payer: Self-pay | Admitting: Hematology and Oncology

## 2022-03-12 LAB — COMPREHENSIVE METABOLIC PANEL
ALT: 17 IU/L (ref 0–32)
AST: 28 IU/L (ref 0–40)
Albumin/Globulin Ratio: 1.3 (ref 1.2–2.2)
Albumin: 3.8 g/dL — ABNORMAL LOW (ref 3.9–4.9)
Alkaline Phosphatase: 60 IU/L (ref 44–121)
BUN/Creatinine Ratio: 13 (ref 12–28)
BUN: 8 mg/dL (ref 8–27)
Bilirubin Total: 0.3 mg/dL (ref 0.0–1.2)
CO2: 28 mmol/L (ref 20–29)
Calcium: 9.4 mg/dL (ref 8.7–10.3)
Chloride: 104 mmol/L (ref 96–106)
Creatinine, Ser: 0.64 mg/dL (ref 0.57–1.00)
Globulin, Total: 3 g/dL (ref 1.5–4.5)
Glucose: 76 mg/dL (ref 70–99)
Potassium: 4.1 mmol/L (ref 3.5–5.2)
Sodium: 143 mmol/L (ref 134–144)
Total Protein: 6.8 g/dL (ref 6.0–8.5)
eGFR: 96 mL/min/{1.73_m2} (ref >59)

## 2022-03-12 LAB — CBC
Hematocrit: 39.4 % (ref 34.0–46.6)
Hemoglobin: 13.3 g/dL (ref 11.1–15.9)
MCH: 30.5 pg (ref 26.6–33.0)
MCHC: 33.8 g/dL (ref 31.5–35.7)
MCV: 90 fL (ref 79–97)
Platelets: 139 10*3/uL — ABNORMAL LOW (ref 150–450)
RBC: 4.36 x10E6/uL (ref 3.77–5.28)
RDW: 13.6 % (ref 11.7–15.4)
WBC: 5.6 10*3/uL (ref 3.4–10.8)

## 2022-03-13 DIAGNOSIS — Z7902 Long term (current) use of antithrombotics/antiplatelets: Secondary | ICD-10-CM | POA: Diagnosis not present

## 2022-03-13 DIAGNOSIS — E43 Unspecified severe protein-calorie malnutrition: Secondary | ICD-10-CM | POA: Diagnosis not present

## 2022-03-13 DIAGNOSIS — G8194 Hemiplegia, unspecified affecting left nondominant side: Secondary | ICD-10-CM | POA: Diagnosis not present

## 2022-03-13 DIAGNOSIS — Z8521 Personal history of malignant neoplasm of larynx: Secondary | ICD-10-CM | POA: Diagnosis not present

## 2022-03-13 DIAGNOSIS — F17210 Nicotine dependence, cigarettes, uncomplicated: Secondary | ICD-10-CM | POA: Diagnosis not present

## 2022-03-13 DIAGNOSIS — K529 Noninfective gastroenteritis and colitis, unspecified: Secondary | ICD-10-CM | POA: Diagnosis not present

## 2022-03-13 DIAGNOSIS — I10 Essential (primary) hypertension: Secondary | ICD-10-CM | POA: Diagnosis not present

## 2022-03-13 DIAGNOSIS — R911 Solitary pulmonary nodule: Secondary | ICD-10-CM | POA: Diagnosis not present

## 2022-03-13 DIAGNOSIS — E785 Hyperlipidemia, unspecified: Secondary | ICD-10-CM | POA: Diagnosis not present

## 2022-03-13 DIAGNOSIS — Z9181 History of falling: Secondary | ICD-10-CM | POA: Diagnosis not present

## 2022-03-13 DIAGNOSIS — M1990 Unspecified osteoarthritis, unspecified site: Secondary | ICD-10-CM | POA: Diagnosis not present

## 2022-03-13 DIAGNOSIS — J441 Chronic obstructive pulmonary disease with (acute) exacerbation: Secondary | ICD-10-CM | POA: Diagnosis not present

## 2022-03-13 DIAGNOSIS — Z8619 Personal history of other infectious and parasitic diseases: Secondary | ICD-10-CM | POA: Diagnosis not present

## 2022-03-13 DIAGNOSIS — Z7982 Long term (current) use of aspirin: Secondary | ICD-10-CM | POA: Diagnosis not present

## 2022-03-14 ENCOUNTER — Other Ambulatory Visit: Payer: Medicare Other | Admitting: Hospice

## 2022-03-14 DIAGNOSIS — R911 Solitary pulmonary nodule: Secondary | ICD-10-CM | POA: Diagnosis not present

## 2022-03-14 DIAGNOSIS — F17210 Nicotine dependence, cigarettes, uncomplicated: Secondary | ICD-10-CM | POA: Diagnosis not present

## 2022-03-14 DIAGNOSIS — Z7982 Long term (current) use of aspirin: Secondary | ICD-10-CM | POA: Diagnosis not present

## 2022-03-14 DIAGNOSIS — Z7902 Long term (current) use of antithrombotics/antiplatelets: Secondary | ICD-10-CM | POA: Diagnosis not present

## 2022-03-14 DIAGNOSIS — G8194 Hemiplegia, unspecified affecting left nondominant side: Secondary | ICD-10-CM | POA: Diagnosis not present

## 2022-03-14 DIAGNOSIS — Z8619 Personal history of other infectious and parasitic diseases: Secondary | ICD-10-CM | POA: Diagnosis not present

## 2022-03-14 DIAGNOSIS — J441 Chronic obstructive pulmonary disease with (acute) exacerbation: Secondary | ICD-10-CM | POA: Diagnosis not present

## 2022-03-14 DIAGNOSIS — Z9181 History of falling: Secondary | ICD-10-CM | POA: Diagnosis not present

## 2022-03-14 DIAGNOSIS — K529 Noninfective gastroenteritis and colitis, unspecified: Secondary | ICD-10-CM | POA: Diagnosis not present

## 2022-03-14 DIAGNOSIS — I10 Essential (primary) hypertension: Secondary | ICD-10-CM | POA: Diagnosis not present

## 2022-03-14 DIAGNOSIS — Z8521 Personal history of malignant neoplasm of larynx: Secondary | ICD-10-CM | POA: Diagnosis not present

## 2022-03-14 DIAGNOSIS — E785 Hyperlipidemia, unspecified: Secondary | ICD-10-CM | POA: Diagnosis not present

## 2022-03-14 DIAGNOSIS — E43 Unspecified severe protein-calorie malnutrition: Secondary | ICD-10-CM | POA: Diagnosis not present

## 2022-03-14 DIAGNOSIS — M1990 Unspecified osteoarthritis, unspecified site: Secondary | ICD-10-CM | POA: Diagnosis not present

## 2022-03-18 ENCOUNTER — Other Ambulatory Visit: Payer: Medicare Other | Admitting: Hospice

## 2022-03-18 DIAGNOSIS — Z515 Encounter for palliative care: Secondary | ICD-10-CM | POA: Diagnosis not present

## 2022-03-18 DIAGNOSIS — F17200 Nicotine dependence, unspecified, uncomplicated: Secondary | ICD-10-CM | POA: Diagnosis not present

## 2022-03-18 DIAGNOSIS — I69351 Hemiplegia and hemiparesis following cerebral infarction affecting right dominant side: Secondary | ICD-10-CM | POA: Diagnosis not present

## 2022-03-18 DIAGNOSIS — E43 Unspecified severe protein-calorie malnutrition: Secondary | ICD-10-CM | POA: Diagnosis not present

## 2022-03-18 DIAGNOSIS — C321 Malignant neoplasm of supraglottis: Secondary | ICD-10-CM

## 2022-03-18 NOTE — Progress Notes (Signed)
Raven Torres Consult Note Telephone: [phone]  Fax: [phone]  PATIENT NAME: Raven Torres [address] Raven Torres [place] [address] [phone] (home)  DOB: May 04, 1956 MRN: [id number]  PRIMARY CARE PROVIDER:    Ladell Pier, MD,  [place] [address] [phone]  REFERRING PROVIDER:   Ladell Pier, MD [address] [place],  [place] [address] [phone]  RESPONSIBLE PARTY:    Contact Information     Name Relation Home Work Mobile   [place] Sister   [phone]   Raven Torres Daughter [phone]  [phone]   Raven Torres [phone]  [phone]       TELEHEALTH VISIT STATEMENT Due to the COVID-19 crisis, this visit was done via telemedicine from my office and it was initiated and consent by this patient and or family.  I connected with patient OR PROXY by a telephone/video  and verified that [name] speaking with the correct person. I discussed the limitations of evaluation and management by telemedicine. The patient expressed understanding and agreed to proceed. Palliative Care was asked to follow this patient to address advance care planning, complex medical decision making and goals of care clarification.   Visit consisted of counseling and education dealing with the complex and emotionally intense issues of symptom management and palliative care in the setting of serious and potentially life-threatening illness. Palliative care team will continue to support patient, patient's family, and medical team.   ASSESSMENT AND / RECOMMENDATIONS:   CODE STATUS: Patient affirmed she is a Do Not Resuscitate.   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  Lung nodules present.  Continue surveillance with Oncology as planned Protein-calorie malnutrition, severe Current weight is 96 Ibs up from 88 2 months ago,  Height 5 feet 6 inches, albumin 3.10 January 2022. Continue Ensure TID.  Patient reports appetite is improving.  If appetite decreases, initiate mirtazapine 7.5 mg daily at bedtime to help boost appetite.  Routine CBC CMP. Tobacco dependence: smoking cessation discussed. She agree to cut back and finally stop smoking. Validation provided. Right side hemiparesis: secondary to CVA. PT/OT is ongoing.She reports PT is working with her in using weights and this is helpful.  Anxiety: Continue Xanax.  Follow up: Palliative care will continue to follow for complex medical decision making, advance care planning, and clarification of goals. Return 6 weeks or prn. Encouraged to call provider sooner with any concerns.     HOSPICE ELIGIBILITY/DIAGNOSIS: TBD  Chief Complaint: Follow-up visit  HISTORY OF PRESENT ILLNESS:  Raven Torres is a 66 y.o. year old female  with multiple morbidities requiring close monitoring and with high risk of complications and  mortality: Malignant epiglottis cancer, status post chemoradiation, right side hemiparesis related to recent CVA, anxiety, tobacco dependence, severe protein caloric malnutrition.  History obtained from review of EMR, discussion with primary team, caregiver, family and/or Raven Torres.  Review and summarization of Epic records shows history from other than patient. Rest of 10 point ROS asked and negative.  Independent interpretation of tests and reviewed as needed, available labs, patient records, imaging, studies and related documents from the EMR.  Recent Labs  Lab 03/11/22 1439  WBC 5.6  HGB 13.3  HCT 39.4  PLT  139*  MCV 90   Recent Labs  Lab 03/11/22 1439  NA 143  K 4.1  CL 104  CO2 28  BUN 8  CREATININE 0.64  GLUCOSE 76   Latest GFR by Cockcroft  Gault (not valid in AKI or ESRD) Estimated Creatinine Clearance: 46.4 mL/min (by C-G formula based on SCr of 0.64 mg/dL). Recent Labs  Lab 03/11/22 1439  AST 28  ALT 17  ALKPHOS 60   No components found for: "ALB" No results for input(s): "APTT", "INR" in the last 168 hours.  Invalid input(s): "PTPATIENT" No results for input(s): "BNP", "PROBNP" in the last 168 hours.   PAST MEDICAL HISTORY:  Active Ambulatory Problems    Diagnosis Date Noted   Chronic hepatitis C virus infection ([place]) 05/03/2014   Hepatic cirrhosis ([place]) 05/31/2014   Rheumatoid arthritis ([place]) 08/22/2014   HTN (hypertension) 08/22/2014   Tobacco abuse 08/22/2014   Fibromyalgia 05/15/2015   Diabetes mellitus screening 11/19/2016   TB lung, latent 01/22/2017   Lung nodules 04/16/2017   Centrilobular emphysema ([place]) 05/15/2017   Atherosclerosis of arteries 05/15/2017   Poor memory 05/15/2017   Shortness of breath 06/04/2017   Coronary artery calcification 06/04/2017   Other fatigue 06/04/2017   High cholesterol 06/24/2017   GOLD COPD III B 04/23/2018   Pruritic dermatitis 04/23/2018   Herpes zoster without complication 09/81/1914   Malignant neoplasm of supraglottis ([place]) 07/13/2020   Tobacco dependence 08/11/2020   Hypotension 09/12/2020   Protein-calorie malnutrition, severe 09/13/2020   Long-term use of high-risk medication 12/31/2016   Other cirrhosis of liver ([place]) 01/22/2017   Rheumatoid arthritis involving multiple sites with positive rheumatoid factor (HCC) 12/31/2016   Cigarette nicotine dependence without complication 78/29/5621   Pneumonia 01/30/2022   Pressure injury of skin 01/31/2022   Acute ischemic stroke ([place]) 02/01/2022   Acute respiratory failure with hypoxia ([place]) 02/02/2022   Multiple lung nodules on CT 02/06/2022   Infection due to parainfluenza virus 4 02/06/2022   Resolved Ambulatory Problems    Diagnosis Date Noted   Elevated BP 08/23/2014   Past Medical History:   Diagnosis Date   Arthritis    Cataract    CHF (congestive heart failure) ([place])    Colitis    COPD (chronic obstructive pulmonary disease) ([place])    Hepatitis C    Hyperlipidemia    Hypertension    Shingles 03/27/2020   TB (tuberculosis)     SOCIAL HX:  Social History   Tobacco Use   Smoking status: Every Day    Packs/day: 1.00    Years: 51.00    Total pack years: 51.00    Types: Cigarettes    Start date: 31   Smokeless tobacco: Never   Tobacco comments:    4 cigs a day   Substance Use Topics   Alcohol use: No    Alcohol/week: 0.0 standard drinks of alcohol     FAMILY HX:  Family History  Problem Relation Age of Onset   Heart disease Mother    Cancer Mother    Melanoma Mother    Esophageal cancer Father    Colon cancer Neg Hx    Colon polyps Neg Hx    Rectal cancer Neg Hx    Stomach cancer Neg Hx       ALLERGIES:  Allergies  Allergen Reactions   Tudorza Pressair [Aclidinium Bromide] Itching   Amitriptyline Other (See Comments)    Eye spasms per pt   Codeine Nausea Only   Tramadol Nausea  Only      PERTINENT MEDICATIONS:  Outpatient Encounter Medications as of 03/18/2022  Medication Sig   albuterol (PROVENTIL) (2.5 MG/3ML) 0.083% nebulizer solution Take 3 mLs (2.5 mg total) by nebulization every 6 (six) hours as needed for wheezing or shortness of breath.   albuterol (VENTOLIN HFA) 108 (90 Base) MCG/ACT inhaler INHALE 2 PUFFS INTO THE LUNGS EVERY 6 HOURS AS NEEDED FOR WHEEZING OR SHORTNESS OF BREATH (Patient taking differently: Inhale 2 puffs into the lungs every 6 (six) hours as needed for wheezing or shortness of breath.)   ALPRAZolam (XANAX) 0.25 MG tablet Take 1 tablet (0.25 mg total) by mouth 2 (two) times daily as needed for anxiety.   aspirin 81 MG chewable tablet Chew 1 tablet (81 mg total) by mouth daily.   atorvastatin (LIPITOR) 40 MG tablet Take 1 tablet (40 mg total) by mouth at bedtime.   clopidogrel (PLAVIX) 75 MG tablet Take 1 tablet (75 mg  total) by mouth daily.   feeding supplement (ENSURE ENLIVE / ENSURE PLUS) LIQD Take 237 mLs by mouth 5 (five) times daily.   Glycopyrrolate-Formoterol (BEVESPI AEROSPHERE) 9-4.8 MCG/ACT AERO Inhale 2 puffs into the lungs 2 (two) times daily.   guaiFENesin-dextromethorphan (ROBITUSSIN DM) 100-10 MG/5ML syrup Take 5 mLs by mouth every 4 (four) hours as needed for cough.   lidocaine (XYLOCAINE) 2 % solution Use as directed 15 mLs in the mouth or throat in the morning, at noon, in the evening, and at bedtime. Patient: Mix 1part 2% viscous lidocaine, 1part H20. Swallow 70m of diluted mixture, 320m before meals and at bedtime, up to QID   No facility-administered encounter medications on file as of 03/18/2022.     Thank you for the opportunity to participate in the care of Ms. Masullo.  The palliative care team will continue to follow. Please call our office at [phone] we can be of additional assistance.   Note: Portions of this note were generated with DrLobbyistDictation errors may occur despite best attempts at proofreading.  LiTeodoro SprayNP

## 2022-03-26 ENCOUNTER — Ambulatory Visit (INDEPENDENT_AMBULATORY_CARE_PROVIDER_SITE_OTHER): Payer: Medicare Other | Admitting: Internal Medicine

## 2022-03-26 ENCOUNTER — Encounter: Payer: Self-pay | Admitting: Internal Medicine

## 2022-03-26 VITALS — BP 112/58 | HR 64 | Ht 66.0 in | Wt 96.0 lb

## 2022-03-26 DIAGNOSIS — I2584 Coronary atherosclerosis due to calcified coronary lesion: Secondary | ICD-10-CM | POA: Diagnosis not present

## 2022-03-26 DIAGNOSIS — I2510 Atherosclerotic heart disease of native coronary artery without angina pectoris: Secondary | ICD-10-CM

## 2022-03-26 DIAGNOSIS — E785 Hyperlipidemia, unspecified: Secondary | ICD-10-CM

## 2022-03-26 MED ORDER — ATORVASTATIN CALCIUM 40 MG PO TABS: 40.0000 mg | ORAL_TABLET | Freq: Every day | ORAL | 0 refills | Status: DC

## 2022-03-26 MED ORDER — CLOPIDOGREL BISULFATE 75 MG PO TABS: 75.0000 mg | ORAL_TABLET | Freq: Every day | ORAL | 0 refills | Status: DC

## 2022-03-26 NOTE — Patient Instructions (Addendum)
Medication Instructions:  Your physician recommends that you continue on your current medications as directed. Please refer to the Current Medication list given to you today.  *If you need a refill on your cardiac medications before your next appointment, please call your pharmacy* NO CHANGES TO [place]  Lab Work: None ordered.  If you have labs (blood work) drawn today and your tests are completely normal, you will receive your results only by: [place] (if you have MyChart) OR A paper copy in the mail If you have any lab test that is abnormal or we need to change your treatment, we will call you to review the results.  Testing/Procedures: None ordered.  Follow-Up: PLEASE SCHEDULE A LOOP IMPLANT APPOINTMENT WITH DR. GREGG TAYLOR AROUND NOON IN THE NEXT FEW WEEKS.    Important Information About Sugar      Implantable Loop Recorder Placement  An implantable loop recorder is a small electronic device that is placed under the skin of your chest. The device records the electrical activity of your heart over a long period of time. Your health care provider can download these recordings to monitor your heart. You may need an implantable loop recorder if you have periods of abnormal heart activity (arrhythmias) or unexplained fainting (syncope). The recorder can be left in place for 1 year or longer. Tell a health care provider about: Any allergies you have. All medicines you are taking, including vitamins, herbs, eye drops, creams, and over-the-counter medicines. Any problems you or family members have had with anesthetic medicines. Any bleeding problems you have. Any surgeries you have had. Any medical conditions you have. Whether you are pregnant or may be pregnant. What are the risks? Generally, this is a safe procedure. However, problems may occur, including: Infection. Bleeding. Allergic reactions to anesthetic medicines. Damage to nerves or blood  vessels. Failure of the device to work. This could require another surgery to replace it. What happens before the procedure?  You may have a physical exam, blood tests, and imaging tests of your heart, such as a chest X-ray. Follow instructions from your health care provider about eating or drinking restrictions. Ask your health care provider about: Changing or stopping your regular medicines. This is especially important if you are taking diabetes medicines or blood thinners. Taking medicines such as aspirin and ibuprofen. These medicines can thin your blood. Do not take these medicines unless your health care provider tells you to take them. Taking over-the-counter medicines, vitamins, herbs, and supplements. Ask your health care provider how your surgical site will be marked or identified. Ask your health care provider what steps will be taken to help prevent infection. These may include: Removing hair at the surgery site. Washing skin with a germ-killing soap. Plan to have someone take you home from the hospital or clinic. Plan to have a responsible adult care for you for at least 24 hours after you leave the hospital or clinic. This is important. Do not use any products that contain nicotine or tobacco, such as cigarettes and e-cigarettes. If you need help quitting, ask your health care provider. What happens during the procedure? An IV will be inserted into one of your veins. You may be given one or more of the following: A medicine to help you relax (sedative). A medicine to numb the area (local anesthetic). A small incision will be made on the left side of your upper chest. A pocket will be created under your skin. The device will be placed in  the pocket. The incision will be closed with stitches (sutures) or adhesive strips. A bandage (dressing) will be placed over the incision. The procedure may vary among health care providers and hospitals. What happens after the  procedure? Your blood pressure, heart rate, breathing rate, and blood oxygen level will be monitored until you leave the hospital or clinic. You may be able to go home on the day of your surgery. Before you go home: Your health care provider will program your recorder. You will learn how to trigger your device with a handheld activator. You will learn how to send recordings to your health care provider. You will get an ID card for your device, and you will be told when to use it. Do not drive for 24 hours if you were given a sedative during your procedure. Summary An implantable loop recorder is a small electronic device that is placed under the skin of your chest to monitor your heart over a long period of time. The recorder can be left in place for 1 year or longer. Plan to have someone take you home from the hospital or clinic. This information is not intended to replace advice given to you by your health care provider. Make sure you discuss any questions you have with your health care provider. Document Revised: 10/02/2021 Document Reviewed: 11/21/2020 Elsevier Patient Education  [employer].

## 2022-03-26 NOTE — Progress Notes (Signed)
HPI Raven Torres is referred today to consider ILR insertion. She is a pleasant 66yo woman with a h/o tobacco abuse, ongoing, HTN, dyslipidemia, and diastolic heart failure. She has coronary calcium but no evidence of obstructive CAD. She has had a nice recovery from her stroke with minimal residual.  Allergies  Allergen Reactions   Tudorza Pressair [Aclidinium Bromide] Itching   Amitriptyline Other (See Comments)    Eye spasms per pt   Codeine Nausea Only   Tramadol Nausea Only     Current Outpatient Medications  Medication Sig Dispense Refill   albuterol (PROVENTIL) (2.5 MG/3ML) 0.083% nebulizer solution Take 3 mLs (2.5 mg total) by nebulization every 6 (six) hours as needed for wheezing or shortness of breath. 150 mL 1   albuterol (VENTOLIN HFA) 108 (90 Base) MCG/ACT inhaler INHALE 2 PUFFS INTO THE LUNGS EVERY 6 HOURS AS NEEDED FOR WHEEZING OR SHORTNESS OF BREATH (Patient taking differently: Inhale 2 puffs into the lungs every 6 (six) hours as needed for wheezing or shortness of breath.) 8.5 g 3   ALPRAZolam (XANAX) 0.25 MG tablet Take 1 tablet (0.25 mg total) by mouth 2 (two) times daily as needed for anxiety. 30 tablet 0   aspirin 81 MG chewable tablet Chew 1 tablet (81 mg total) by mouth daily. 30 tablet 0   feeding supplement (ENSURE ENLIVE / ENSURE PLUS) LIQD Take 237 mLs by mouth 5 (five) times daily.     Glycopyrrolate-Formoterol (BEVESPI AEROSPHERE) 9-4.8 MCG/ACT AERO Inhale 2 puffs into the lungs 2 (two) times daily. 10.7 g 5   lidocaine (XYLOCAINE) 2 % solution Use as directed 15 mLs in the mouth or throat in the morning, at noon, in the evening, and at bedtime. Patient: Mix 1part 2% viscous lidocaine, 1part H20. Swallow 80m of diluted mixture, 374m before meals and at bedtime, up to QID     atorvastatin (LIPITOR) 40 MG tablet Take 1 tablet (40 mg total) by mouth at bedtime. 30 tablet 0   clopidogrel (PLAVIX) 75 MG tablet Take 1 tablet (75 mg total) by mouth daily. 20  tablet 0   guaiFENesin-dextromethorphan (ROBITUSSIN DM) 100-10 MG/5ML syrup Take 5 mLs by mouth every 4 (four) hours as needed for cough. (Patient not taking: Reported on 03/26/2022) 118 mL 0   No current facility-administered medications for this visit.     Past Medical History:  Diagnosis Date   Arthritis    Cataract    removed both eyes    CHF (congestive heart failure) [place]   Colitis    COPD (chronic obstructive [place] disease) [place]   Hepatitis C    Hyperlipidemia    Hypertension    Shingles 03/27/2020   TB (tuberculosis)    treatment     ROS:   All systems reviewed and negative except as noted in the HPI.   Past Surgical History:  Procedure Laterality Date   ABDOMINAL HYSTERECTOMY     CATARACT EXTRACTION, BILATERAL     COLONOSCOPY     COLONOSCOPY  04/04/2020   DIRECT LARYNGOSCOPY N/A 06/26/2020   Procedure: DIRECT LARYNGOSCOPY AND BIOPSY;  Surgeon: NeRozetta NunneryMD;  [facility] Service: [facility];  Laterality: N/A;   IR GASTROSTOMY TUBE MOD SED  07/27/2020   IR GASTROSTOMY TUBE REMOVAL  04/17/2021   IR IMAGING GUIDED PORT INSERTION  07/27/2020   IR REMOVAL TUN ACCESS W/ PORT W/O FL MOD SED  03/20/2021   KNEE SURGERY Right    POLYPECTOMY  11/17/2008   HPP x 1      Family History  Problem Relation Age of Onset   Heart disease Mother    Cancer Mother    Melanoma Mother    Esophageal cancer Father    Colon cancer Neg Hx    Colon polyps Neg Hx    Rectal cancer Neg Hx    Stomach cancer Neg Hx      Social History   Socioeconomic History   Marital status: Married    Spouse name: Not on file   Number of children: Not on file   Years of education: Not on file   Highest education level: Not on file  Occupational History   Not on file  Tobacco Use   Smoking status: Every Day    Packs/day: 1.00    Years: 51.00    Total pack years: 51.00    Types: Cigarettes    Start date: 28   Smokeless tobacco: Never   Tobacco comments:    4 cigs  a day   Vaping Use   Vaping Use: Never used  Substance and Sexual Activity   Alcohol use: No    Alcohol/week: 0.0 standard drinks of alcohol   Drug use: No   Sexual activity: Not Currently  Other Topics Concern   Not on file  Social History Narrative   Not on file   Social Determinants of Health   Financial Resource Strain: Not on file  Food Insecurity: No Food Insecurity (07/11/2020)   Hunger Vital Sign    Worried About Running Out of Food in the Last Year: Never true    Ran Out of Food in the Last Year: Never true  Transportation Needs: No Transportation Needs (07/11/2020)   PRAPARE - Hydrologist (Medical): No    Lack of Transportation (Non-Medical): No  Physical Activity: Not on file  Stress: Not on file  Social Connections: Not on file  Intimate Partner Violence: Not on file     BP (!) 112/58   Pulse 64   Ht '5\' 6"'$  (1.676 m)   Wt 96 lb (43.5 kg)   SpO2 95%   BMI 15.49 kg/m   Physical Exam:  Well appearing NAD HEENT: Unremarkable Neck:  No JVD, no thyromegally Lymphatics:  No adenopathy Back:  No CVA tenderness Lungs:  Clear with decreased breath sounds. HEART:  Regular rate rhythm, no murmurs, no rubs, no clicks Abd:  soft, positive bowel sounds, no organomegally, no rebound, no guarding Ext:  2 plus pulses, no edema, no cyanosis, no clubbing Skin:  No rashes no nodules Neuro:  CN II through XII intact, motor grossly intact  Assess/Plan:  Cryptogenic stroke - I have discussed the indications for ILR insertion. She would like to return in a couple of weeks. "I want to think about a bit." I explained the indication for insertion of the ILR and she has no additional questions. Dyspnea - this is multifactorial but mostly due to COPD. She has not required oxygen however.  Carleene Overlie Kaytelynn Scripter,MD

## 2022-03-27 ENCOUNTER — Telehealth: Payer: Self-pay | Admitting: Hematology and Oncology

## 2022-03-27 NOTE — Telephone Encounter (Signed)
Contacted patient to scheduled appointments. Left message with appointment details and a call back number if patient had any questions or could not accommodate the time we provided.   

## 2022-03-29 ENCOUNTER — Telehealth: Payer: Self-pay | Admitting: Emergency Medicine

## 2022-03-29 NOTE — Telephone Encounter (Signed)
Copied from [place] [phone]. Topic: Appointment Scheduling - Scheduling Inquiry for Clinic >> Mar 29, 2022  2:37 PM Erskine Squibb wrote: Reason for CRM: Amy with [facility] called in stating they did not make any visits to the home this week per family request. They are going to close out her file but if there are any other issues or anything else needs to be done just send a new referral and they would love to help. Please assist further if necessary

## 2022-04-01 ENCOUNTER — Other Ambulatory Visit: Payer: Self-pay

## 2022-04-01 ENCOUNTER — Inpatient Hospital Stay: Payer: Medicare Other | Attending: Hematology and Oncology | Admitting: Hematology and Oncology

## 2022-04-01 VITALS — BP 101/52 | HR 74 | Temp 97.7°F | Resp 18 | Ht 66.0 in | Wt 96.2 lb

## 2022-04-01 DIAGNOSIS — Z7982 Long term (current) use of aspirin: Secondary | ICD-10-CM | POA: Diagnosis not present

## 2022-04-01 DIAGNOSIS — Z931 Gastrostomy status: Secondary | ICD-10-CM | POA: Diagnosis not present

## 2022-04-01 DIAGNOSIS — R634 Abnormal weight loss: Secondary | ICD-10-CM

## 2022-04-01 DIAGNOSIS — Z923 Personal history of irradiation: Secondary | ICD-10-CM | POA: Insufficient documentation

## 2022-04-01 DIAGNOSIS — F17210 Nicotine dependence, cigarettes, uncomplicated: Secondary | ICD-10-CM | POA: Diagnosis not present

## 2022-04-01 DIAGNOSIS — C321 Malignant neoplasm of supraglottis: Secondary | ICD-10-CM | POA: Diagnosis not present

## 2022-04-01 DIAGNOSIS — Z7902 Long term (current) use of antithrombotics/antiplatelets: Secondary | ICD-10-CM | POA: Insufficient documentation

## 2022-04-01 DIAGNOSIS — R911 Solitary pulmonary nodule: Secondary | ICD-10-CM | POA: Diagnosis not present

## 2022-04-01 DIAGNOSIS — Z79899 Other long term (current) drug therapy: Secondary | ICD-10-CM | POA: Diagnosis not present

## 2022-04-01 DIAGNOSIS — Z8673 Personal history of transient ischemic attack (TIA), and cerebral infarction without residual deficits: Secondary | ICD-10-CM | POA: Insufficient documentation

## 2022-04-01 NOTE — Progress Notes (Signed)
[facility] CONSULT NOTE  Patient Care Team: Ladell Pier, MD as PCP - General (Internal Medicine) Debara Pickett Nadean Corwin, MD as PCP - Cardiology (Cardiology) Eppie Gibson, MD as Consulting Physician (Radiation Oncology) Benay Pike, MD as Consulting Physician (Hematology and Oncology) Malmfelt, Stephani Police, RN as Oncology Nurse Navigator  CHIEF COMPLAINTS/PURPOSE OF CONSULTATION:   Supraglottic cancer   ASSESSMENT & PLAN:   This is a very pleasant 66 year old female patient with past medical history significant for hypertension, rheumatoid arthritis, hepatitis C induced cirrhosis  s/p chemo radiation with weekly cisplatin for 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 End of treatment PET/CT with complete response, no concern for residual disease.   Physical examination and review of systems today without any clinical concerns for disease.   I encouraged her to reestablish with ENT for follow-up and surveillance. Her last ENT appointment was in all April 2023 with Dr. Roseanna Rainbow and she was recommended to come back for follow-up in 4 to 6 months At this time there is no clinical concern for recurrence of the laryngeal cancer.  She was however encouraged to keep up with ENT surveillance and she acknowledged.  2.  Right lower lobe pulmonary nodule, noted on CT chest abdomen pelvis while she was hospitalized.  12 mm irregular spiculated right lower lobe pulmonary nodule concerning for primary bronchogenic neoplasm.  CT chest was recommended in 3 months which has now been scheduled for September 7 and she has a follow-up with Dr. Verlee Monte.  She is certainly high risk for primary bronchogenic neoplasm given her smoking history.  She will not be a good candidate for chemotherapy unfortunately unless her weight continues to improve.  If this is indeed a primary neoplasm and it is a solitary nodule, she may be considered for SBRT.  3 Weight loss or inability to  gain, She is at 96 pounds today.  She is not hypothyroid, this has been tested multiple times.  She continues to report intake of at least 1500 kcal a day but fails to gain much weight.  We have discussed in the past about considering an endocrinology visit for inability to gain weight evaluation however she was quite overwhelmed with the recent stroke, inpatient admission and multiple follow-ups including neurology, cardiology.  She was encouraged to continue to stay in touch with our nutritionist and reconsider endocrinology evaluation.  4. Encouraged smoking cessation.   Thank you for consulting [place] in the care of this patient.  Please do not hesitate to contact us with any additional questions or concerns.  HISTORY OF PRESENTING ILLNESS:   Raven Torres 66 y.o. female is here because of new diagnosis of laryngeal cancer  Oncology History Overview Note   Raven Torres is a 66 y.o. female who presents for evaluation of weight loss and enlarged right neck node.  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 She has had a approximate 30 pound weight loss over the past 4 months.  She is having no airway problems or hoarseness.GRACEE Torres is a 66 y.o. female who presents for evaluation of weight loss and enlarged right neck node. She has had a approximate 30 pound weight loss over the past 4 months.  She has no hoarseness or airway problems. On fiberoptic laryngoscopy she has abnormality of the laryngeal surface of the  epiglottis and is taken to the operating room for direct laryngoscopy and biopsy.  06/16/2020 2.0 x 3.2 x 2.5 cm supraglottic laryngeal mass involving the epiglottis and extending along the aryepiglottic folds bilaterally. Tumor extends into the pre-epiglottic fat and likely into the vallecula. Tumor extends into the upper  thyroid cartilage bilaterally, abutting and possibly extending through the outer cortex (particularly on the left). This almost certainly reflects a supraglottic laryngeal squamous cell carcinoma  06/26/2020, she had laryngoscopy which showed there was a friable ulcerative tumor involving predominantly the  supraglottic area above the vocal cords as the vocal cords were clear to evaluation.  This extended up the laryngeal surface of epiglottis, more on the right side. A direct laryngoscopy was also performed  of the piriform sinuses which were clear bilaterally.  06/28/2020  1. The supraglottic mass has a maximum SUV of 12.5 and the enlarged right level II lymph node has a maximum SUV of 16.9, both compatible with malignancy. No findings of metastatic disease to the chest, abdomen, pelvis, or regional skeleton. 2. New lingular ground-glass opacity compared to 01/20/2020 with very low-grade activity, probably from alveolitis or low-grade atypical infection. 3. Aortic Atherosclerosis (ICD10-I70.0) and Emphysema (ICD10-J43.9). 4. Airway thickening is present, suggesting bronchitis or reactive airways disease. 5. 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 6.  Prominent stool throughout the colon favors constipation.  Her case was discussed in the tumor board, and radiology review suggested that there is no definitive evidence of T4 involvement, hence plan was to consider concurrent chemoradiation and thus medical oncology referral.  She completed 5 weekly cycles of cisplatin 08/2020.    Malignant neoplasm of supraglottis ([place])  07/10/2020 Cancer Staging   Staging form: Larynx - Supraglottis, AJCC 8th Edition - Clinical stage from 07/10/2020: Stage IVA (cT3, cN2a, cM0) - Signed by Eppie Gibson, MD on 07/14/2020   07/13/2020 Initial Diagnosis   Laryngeal cancer ([place])   07/25/2020 - 08/25/2020 Chemotherapy   Patient is on  Treatment Plan : HEAD/NECK Cisplatin q7d     End of treatment PET scan showed no residual hypermetabolic supraglottic tissue.No evidence of residual disease.  Interval history  She is here for FU with her daughter today.  Since her last visit, she had strokes and was inpatient, then discharged to a rehab.  She is recovering well except for some weakness of the left wrist and some memory changes.  She continues to smoke but has cut down on her smoking.  She tells me that she continues to eat well, drinks about 4 of the boost plus a day however does not seem to gain much weight.  She is understandably overwhelmed with all appointments, had some questions about taking both aspirin and Plavix.  She has a neurology appointment coming up tomorrow.  She last saw Dr. Roseanna Rainbow from ENT in April 2023.  Rest of the pertinent 10 point ROS reviewed and negative  MEDICAL HISTORY:  Past Medical History:  Diagnosis Date   Arthritis    Cataract    removed both eyes    CHF (congestive heart failure) ([place])    Colitis    COPD (chronic obstructive pulmonary disease) ([place])    Hepatitis C    Hyperlipidemia    Hypertension    Shingles 03/27/2020   TB (tuberculosis)    treatment     SURGICAL HISTORY: Past Surgical History:  Procedure Laterality Date   ABDOMINAL HYSTERECTOMY     CATARACT EXTRACTION, BILATERAL  COLONOSCOPY     COLONOSCOPY  04/04/2020   DIRECT LARYNGOSCOPY N/A 06/26/2020   Procedure: DIRECT LARYNGOSCOPY AND BIOPSY;  Surgeon: Rozetta Nunnery, MD;  Location: [facility];  Service: ENT;  Laterality: N/A;   IR GASTROSTOMY TUBE MOD SED  07/27/2020   IR GASTROSTOMY TUBE REMOVAL  04/17/2021   IR IMAGING GUIDED PORT INSERTION  07/27/2020   IR REMOVAL TUN ACCESS W/ PORT W/O FL MOD SED  03/20/2021   KNEE SURGERY Right    POLYPECTOMY  11/17/2008   HPP x 1     SOCIAL HISTORY: Social History   Socioeconomic History   Marital status: Married    Spouse name: Not on file   Number of  children: Not on file   Years of education: Not on file   Highest education level: Not on file  Occupational History   Not on file  Tobacco Use   Smoking status: Every Day    Packs/day: 1.00    Years: 51.00    Total pack years: 51.00    Types: Cigarettes    Start date: 37   Smokeless tobacco: Never   Tobacco comments:    4 cigs a day   Vaping Use   Vaping Use: Never used  Substance and Sexual Activity   Alcohol use: No    Alcohol/week: 0.0 standard drinks of alcohol   Drug use: No   Sexual activity: Not Currently  Other Topics Concern   Not on file  Social History Narrative   Not on file   Social Determinants of Health   Financial Resource Strain: Not on file  Food Insecurity: No Food Insecurity (07/11/2020)   Hunger Vital Sign    Worried About Running Out of Food in the Last Year: Never true    Ran Out of Food in the Last Year: Never true  Transportation Needs: No Transportation Needs (07/11/2020)   PRAPARE - Hydrologist (Medical): No    Lack of Transportation (Non-Medical): No  Physical Activity: Not on file  Stress: Not on file  Social Connections: Not on file  Intimate Partner Violence: Not on file    FAMILY HISTORY: Family History  Problem Relation Age of Onset   Heart disease Mother    Cancer Mother    Melanoma Mother    Esophageal cancer Father    Colon cancer Neg Hx    Colon polyps Neg Hx    Rectal cancer Neg Hx    Stomach cancer Neg Hx     ALLERGIES:  is allergic to [place] pressair [aclidinium bromide], amitriptyline, codeine, and tramadol.  MEDICATIONS:  Current Outpatient Medications  Medication Sig Dispense Refill   albuterol (PROVENTIL) (2.5 MG/3ML) 0.083% nebulizer solution Take 3 mLs (2.5 mg total) by nebulization every 6 (six) hours as needed for wheezing or shortness of breath. 150 mL 1   albuterol (VENTOLIN HFA) 108 (90 Base) MCG/ACT inhaler INHALE 2 PUFFS INTO THE LUNGS EVERY 6 HOURS AS NEEDED FOR WHEEZING  OR SHORTNESS OF BREATH (Patient taking differently: Inhale 2 puffs into the lungs every 6 (six) hours as needed for wheezing or shortness of breath.) 8.5 g 3   ALPRAZolam (XANAX) 0.25 MG tablet Take 1 tablet (0.25 mg total) by mouth 2 (two) times daily as needed for anxiety. 30 tablet 0   aspirin 81 MG chewable tablet Chew 1 tablet (81 mg total) by mouth daily. 30 tablet 0   atorvastatin (LIPITOR) 40 MG tablet Take 1 tablet (40 mg total)  by mouth at bedtime. 30 tablet 0   clopidogrel (PLAVIX) 75 MG tablet Take 1 tablet (75 mg total) by mouth daily. 20 tablet 0   feeding supplement (ENSURE ENLIVE / ENSURE PLUS) LIQD Take 237 mLs by mouth 5 (five) times daily.     Glycopyrrolate-Formoterol (BEVESPI AEROSPHERE) 9-4.8 MCG/ACT AERO Inhale 2 puffs into the lungs 2 (two) times daily. 10.7 g 5   guaiFENesin-dextromethorphan (ROBITUSSIN DM) 100-10 MG/5ML syrup Take 5 mLs by mouth every 4 (four) hours as needed for cough. (Patient not taking: Reported on 03/26/2022) 118 mL 0   lidocaine (XYLOCAINE) 2 % solution Use as directed 15 mLs in the mouth or throat in the morning, at noon, in the evening, and at bedtime. Patient: Mix 1part 2% viscous lidocaine, 1part H20. Swallow 55m of diluted mixture, 316m before meals and at bedtime, up to QID     No current facility-administered medications for this visit.    PHYSICAL EXAMINATION: ECOG PERFORMANCE STATUS: 0 - Asymptomatic  There were no vitals filed for this visit.   Physical Exam Constitutional:      Appearance: Normal appearance.  HENT:     Head: Normocephalic and atraumatic.     Mouth/Throat:     Pharynx: No oropharyngeal exudate or posterior oropharyngeal erythema.  Eyes:     Extraocular Movements: Extraocular movements intact.     Pupils: Pupils are equal, round, and reactive to light.  Cardiovascular:     Rate and Rhythm: Normal rate and regular rhythm.     Pulses: Normal pulses.     Heart sounds: Normal heart sounds.  Pulmonary:      Effort: Pulmonary effort is normal.     Breath sounds: Normal breath sounds.  Abdominal:     General: Abdomen is flat. There is no distension.     Palpations: Abdomen is soft. There is no mass.     Tenderness: There is no abdominal tenderness.     Comments: . Marland Kitchen  Musculoskeletal:        General: No swelling or tenderness. Normal range of motion.     Cervical back: Normal range of motion and neck supple. Tenderness: rad changes, no ulceration.  Lymphadenopathy:     Cervical: No cervical adenopathy (No palpable cervical lymphadenopathy).  Skin:    General: Skin is warm and dry.  Neurological:     General: No focal deficit present.     Mental Status: She is alert.  Psychiatric:        Mood and Affect: Mood normal.       LABORATORY DATA:  I have reviewed the data as listed Lab Results  Component Value Date   WBC 5.6 03/11/2022   HGB 13.3 03/11/2022   HCT 39.4 03/11/2022   MCV 90 03/11/2022   PLT 139 (L) 03/11/2022     Chemistry      Component Value Date/Time   NA 143 03/11/2022 1439   K 4.1 03/11/2022 1439   CL 104 03/11/2022 1439   CO2 28 03/11/2022 1439   BUN 8 03/11/2022 1439   CREATININE 0.64 03/11/2022 1439   CREATININE 0.79 07/19/2021 1250   CREATININE 0.70 02/26/2016 1143      Component Value Date/Time   CALCIUM 9.4 03/11/2022 1439   ALKPHOS 60 03/11/2022 1439   AST 28 03/11/2022 1439   AST 27 02/08/2021 1201   ALT 17 03/11/2022 1439   ALT 12 02/08/2021 1201   BILITOT 0.3 03/11/2022 1439   BILITOT 0.4 02/08/2021 1201  RADIOGRAPHIC STUDIES: I have personally reviewed the radiological images as listed and agreed with the findings in the report. No results found.   All questions were answered. The patient knows to call the clinic with any problems, questions or concerns.  I spent 30 minutes in the care of this patient including history physical review of records, counseling and coordination of care.   Benay Pike, MD 04/01/2022 8:49 AM

## 2022-04-02 ENCOUNTER — Telehealth: Payer: Self-pay | Admitting: Hematology and Oncology

## 2022-04-02 NOTE — Telephone Encounter (Signed)
Contacted patient to scheduled appointments. Patient is aware of appointments that are scheduled.   

## 2022-04-03 ENCOUNTER — Encounter: Payer: Self-pay | Admitting: Diagnostic Neuroimaging

## 2022-04-03 ENCOUNTER — Ambulatory Visit (INDEPENDENT_AMBULATORY_CARE_PROVIDER_SITE_OTHER): Payer: Medicare Other | Admitting: Diagnostic Neuroimaging

## 2022-04-03 VITALS — BP 85/58 | HR 74 | Ht 66.0 in | Wt 97.5 lb

## 2022-04-03 DIAGNOSIS — I63413 Cerebral infarction due to embolism of bilateral middle cerebral arteries: Secondary | ICD-10-CM | POA: Diagnosis not present

## 2022-04-03 NOTE — Patient Instructions (Signed)
-   completed dual antiplatelet with Aspirin '81mg'$  daily along with plavix '75mg'$  daily x 21 days; now ok to use aspirin '81mg'$  daily alone.  - continue atorvastatin

## 2022-04-03 NOTE — Progress Notes (Signed)
[facility]  PATIENT: Raven Torres DOB: 03-Mar-1956  REFERRING CLINICIAN: Aline August, MD HISTORY FROM: PATIENT  REASON FOR VISIT: NEW CONSULT    HISTORICAL  CHIEF COMPLAINT:  Chief Complaint  Patient presents with   New Patient (Initial Visit)    Pt states she is doing pretty good. She states she has been really tired lastly. She reports her left hand has been affected the most which is her dominant hand. She reports numbness and tingling sensation through out. Room 6 alone    HISTORY OF PRESENT ILLNESS:   66 year old female here for evaluation of stroke.  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  Abnormal lesion on lung CT scan, concerning for malignancy.  Protein calorie malnutrition.  Tobacco dependence.  Patient presented to hospital in July 2023 for shortness of breath, fever, cough, numbness and weakness of the left hand.  Patient was found to have right parietal ischemic infarction.  Concern for hypercoagulable state associated with malignancy or other cause.  Stroke work-up was completed.  Also had acute respiratory failure, related to COPD and community-acquired pneumonia.  Also had acute on chronic heart failure.  Since that time patient is doing well.  She is back at home.  She is taking care of herself.  Overall she is in good spirits.   REVIEW OF SYSTEMS: Full 14 system review of systems performed and negative with exception of:   ALLERGIES: Allergies  Allergen Reactions   Tudorza Pressair [Aclidinium Bromide] Itching   Amitriptyline Other (See Comments)    Eye spasms per pt   Codeine Nausea Only   Tramadol Nausea Only    HOME MEDICATIONS: Outpatient Medications Prior to Visit  Medication Sig Dispense Refill   albuterol (PROVENTIL) (2.5 MG/3ML) 0.083% nebulizer solution Take 3 mLs (2.5 mg total) by nebulization every 6 (six) hours as needed for wheezing or shortness of  breath. 150 mL 1   albuterol (VENTOLIN HFA) 108 (90 Base) MCG/ACT inhaler INHALE 2 PUFFS INTO THE LUNGS EVERY 6 HOURS AS NEEDED FOR WHEEZING OR SHORTNESS OF BREATH (Patient taking differently: Inhale 2 puffs into the lungs every 6 (six) hours as needed for wheezing or shortness of breath.) 8.5 g 3   ALPRAZolam (XANAX) 0.25 MG tablet Take 1 tablet (0.25 mg total) by mouth 2 (two) times daily as needed for anxiety. 30 tablet 0   aspirin 81 MG chewable tablet Chew 1 tablet (81 mg total) by mouth daily. 30 tablet 0   atorvastatin (LIPITOR) 40 MG tablet Take 1 tablet (40 mg total) by mouth at bedtime. 30 tablet 0   feeding supplement (ENSURE ENLIVE / ENSURE PLUS) LIQD Take 237 mLs by mouth 5 (five) times daily.     Glycopyrrolate-Formoterol (BEVESPI AEROSPHERE) 9-4.8 MCG/ACT AERO Inhale 2 puffs into the lungs 2 (two) times daily. 10.7 g 5   lidocaine (XYLOCAINE) 2 % solution Use as directed 15 mLs in the mouth or throat in the morning, at noon, in the evening, and at bedtime. Patient: Mix 1part 2% viscous lidocaine, 1part H20. Swallow 58m of diluted mixture, 362m before meals and at bedtime, up to QID     clopidogrel (PLAVIX) 75 MG tablet Take 1 tablet (75 mg total) by mouth daily. 20 tablet 0   No facility-administered medications prior to visit.    PAST MEDICAL HISTORY: Past Medical History:  Diagnosis Date   Arthritis    Cataract    removed both eyes  CHF (congestive heart failure) (HCC)    Colitis    COPD (chronic obstructive pulmonary disease) ([place])    Hepatitis C    Hyperlipidemia    Hypertension    Shingles 03/27/2020   TB (tuberculosis)    treatment     PAST SURGICAL HISTORY: Past Surgical History:  Procedure Laterality Date   ABDOMINAL HYSTERECTOMY     CATARACT EXTRACTION, BILATERAL     COLONOSCOPY     COLONOSCOPY  04/04/2020   DIRECT LARYNGOSCOPY N/A 06/26/2020   Procedure: DIRECT LARYNGOSCOPY AND BIOPSY;  Surgeon: Rozetta Nunnery, MD;  Location: [facility] OR;  Service:  ENT;  Laterality: N/A;   IR GASTROSTOMY TUBE MOD SED  07/27/2020   IR GASTROSTOMY TUBE REMOVAL  04/17/2021   IR IMAGING GUIDED PORT INSERTION  07/27/2020   IR REMOVAL TUN ACCESS W/ PORT W/O FL MOD SED  03/20/2021   KNEE SURGERY Right    POLYPECTOMY  11/17/2008   HPP x 1     FAMILY HISTORY: Family History  Problem Relation Age of Onset   Heart disease Mother    Cancer Mother    Melanoma Mother    Esophageal cancer Father    Colon cancer Neg Hx    Colon polyps Neg Hx    Rectal cancer Neg Hx    Stomach cancer Neg Hx     SOCIAL HISTORY: Social History   Socioeconomic History   Marital status: Married    Spouse name: Not on file   Number of children: Not on file   Years of education: Not on file   Highest education level: Not on file  Occupational History   Not on file  Tobacco Use   Smoking status: Every Day    Packs/day: 1.00    Years: 51.00    Total pack years: 51.00    Types: Cigarettes    Start date: 58   Smokeless tobacco: Never   Tobacco comments:    4 cigs a day   Vaping Use   Vaping Use: Never used  Substance and Sexual Activity   Alcohol use: No    Alcohol/week: 0.0 standard drinks of alcohol   Drug use: No   Sexual activity: Not Currently  Other Topics Concern   Not on file  Social History Narrative   Not on file   Social Determinants of Health   Financial Resource Strain: Not on file  Food Insecurity: No Food Insecurity (07/11/2020)   Hunger Vital Sign    Worried About Running Out of Food in the Last Year: Never true    Ran Out of Food in the Last Year: Never true  Transportation Needs: No Transportation Needs (07/11/2020)   PRAPARE - Hydrologist (Medical): No    Lack of Transportation (Non-Medical): No  Physical Activity: Not on file  Stress: Not on file  Social Connections: Not on file  Intimate Partner Violence: Not on file     PHYSICAL EXAM  GENERAL EXAM/CONSTITUTIONAL: Vitals:  Vitals:   04/03/22  1022  BP: (!) 85/58  Pulse: 74  Weight: 97 lb 8 oz (44.2 kg)  Height: '5\' 6"'$  (1.676 m)   Body mass index is 15.74 kg/m. Wt Readings from Last 3 Encounters:  04/03/22 97 lb 8 oz (44.2 kg)  04/01/22 96 lb 3.2 oz (43.6 kg)  03/26/22 96 lb (43.5 kg)   Patient is in no distress; FRAIL APPEARING; HOARSE VOICE; COUGHING  CARDIOVASCULAR: Examination of carotid arteries is normal; no carotid  bruits Regular rate and rhythm, no murmurs Examination of peripheral vascular system by observation and palpation is normal  EYES: Ophthalmoscopic exam of optic discs and posterior segments is normal; no papilledema or hemorrhages No results found.  MUSCULOSKELETAL: Gait, strength, tone, movements noted in Neurologic exam below  NEUROLOGIC: MENTAL STATUS:     01/11/2021   10:01 AM 05/07/2019   11:02 AM 07/22/2017    4:42 PM  MMSE - Mini Mental State Exam  Orientation to time '5 5 5  '$ Orientation to Place '5 5 5  '$ Registration '3 3 3  '$ Attention/ Calculation '5 5 5  '$ Recall '3 3 2  '$ Language- name 2 objects '2 2 2  '$ Language- repeat '1 1 1  '$ Language- follow 3 step command '3 3 3  '$ Language- read & follow direction '1 1 1  '$ Write a sentence '1 1 1  '$ Copy design '1 1 1  '$ Total score '30 30 29   '$ awake, alert, oriented to person, place and time recent and remote memory intact normal attention and concentration language fluent, comprehension intact, naming intact fund of knowledge appropriate  CRANIAL NERVE:  2nd - no papilledema on fundoscopic exam 2nd, 3rd, 4th, 6th - pupils equal and reactive to light, visual fields full to confrontation, extraocular muscles intact, no nystagmus 5th - facial sensation symmetric 7th - facial strength symmetric 8th - hearing intact 9th - palate elevates symmetrically, uvula midline 11th - shoulder shrug symmetric 12th - tongue protrusion midline  MOTOR:  normal bulk and tone, full strength in the BUE, BLE; EXCEPT DECR IN LEFT HAND ABDUCTION  SENSORY:  normal and  symmetric to light touch, temperature, vibration; DECR IN LEFT HAND  COORDINATION:  finger-nose-finger, fine finger movements normal  REFLEXES:  deep tendon reflexes TRACE and symmetric  GAIT/STATION:  narrow based gait     DIAGNOSTIC DATA (LABS, IMAGING, TESTING) - I reviewed patient records, labs, notes, testing and imaging myself where available.  Lab Results  Component Value Date   WBC 5.6 03/11/2022   HGB 13.3 03/11/2022   HCT 39.4 03/11/2022   MCV 90 03/11/2022   PLT 139 (L) 03/11/2022      Component Value Date/Time   NA 143 03/11/2022 1439   K 4.1 03/11/2022 1439   CL 104 03/11/2022 1439   CO2 28 03/11/2022 1439   GLUCOSE 76 03/11/2022 1439   GLUCOSE 92 02/07/2022 0612   BUN 8 03/11/2022 1439   CREATININE 0.64 03/11/2022 1439   CREATININE 0.79 07/19/2021 1250   CREATININE 0.70 02/26/2016 1143   CALCIUM 9.4 03/11/2022 1439   PROT 6.8 03/11/2022 1439   ALBUMIN 3.8 (L) 03/11/2022 1439   AST 28 03/11/2022 1439   AST 27 02/08/2021 1201   ALT 17 03/11/2022 1439   ALT 12 02/08/2021 1201   ALKPHOS 60 03/11/2022 1439   BILITOT 0.3 03/11/2022 1439   BILITOT 0.4 02/08/2021 1201   GFRNONAA >60 02/07/2022 0612   GFRNONAA >60 07/19/2021 1250   GFRNONAA >89 08/23/2014 1506   GFRAA 74 06/02/2020 0845   GFRAA >89 08/23/2014 1506   Lab Results  Component Value Date   CHOL 144 02/01/2022   HDL 40 (L) 02/01/2022   LDLCALC 91 02/01/2022   TRIG 65 02/01/2022   CHOLHDL 3.6 02/01/2022   Lab Results  Component Value Date   HGBA1C 5.0 02/02/2022   Lab Results  Component Value Date   ZOXWRUEA54 098 06/02/2020   Lab Results  Component Value Date   TSH 2.522 07/19/2021  01/31/22 MRI brain  - Small acute infarcts in the occipital lobe bilaterally. Cluster of acute infarcts in the right parietal lobe. Findings suggest emboli given different vascular distributions. - Moderate chronic microvascular ischemic change in the white matter. No acute  hemorrhage  01/31/22 CT HEAD IMPRESSION: 1. 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 No associated hemorrhage or mass effect. 2. No other new acute intracranial abnormality. 3. Underlying atrophy with chronic small vessel ischemic disease.   01/31/22 CTA HEAD AND NECK IMPRESSION: 1. Small focal filling defect along the posterior wall of the proximal cervical right ICA, suspicious for thrombus. Finding favored to reflect changes due to a recently ruptured plaque, although a possible small focal dissection could also be considered. No significant stenosis. 2. Moderate atheromatous change about the skull base with associated moderate multifocal narrowing about the siphons and about the proximal left V4 segment. No other hemodynamically significant or correctable stenosis. 3. Emphysema (ICD10-J43.9).    ASSESSMENT AND PLAN  66 y.o. year old female here with:   Dx:  1. Cerebrovascular accident (CVA) due to bilateral embolism of middle cerebral arteries ([place])       PLAN:  85F with supraglottic ca s/p chemo + rad in 2022, p/w SOB + cough, developed L hand weakness inpatient and found to have BL embolic appearing infarcts. Strokes were initially felt by our team to be secondary to hypercoag state from cancer. 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    BILATERAL STROKES (right parietal, bilateral occipital) --> possible causes include intracranial atherosclerosis, cardioembolic source, hypercoag state assoc with malignancy  - consider implanted loop recorder (if patient would agree for anticoagulation)  - completed dual antiplatelet with Aspirin '81mg'$  daily along with plavix '75mg'$  daily x 21 days; now ok to use aspirin '81mg'$  daily alone  - continue atorvastatin  Return for pending test results, pending if symptoms worsen or fail to  improve, return to PCP.    Penni Bombard, MD 1/76/1607, 37:10 AM Certified in Neurology, Neurophysiology and Neuroimaging  [facility] [address], [place], [place] [address] [phone]

## 2022-04-03 NOTE — Telephone Encounter (Signed)
Noted.______FYI.------DD,RMA

## 2022-04-11 ENCOUNTER — Ambulatory Visit (HOSPITAL_COMMUNITY): Admission: RE | Admit: 2022-04-11 | Payer: Medicare Other | Source: Ambulatory Visit

## 2022-04-12 ENCOUNTER — Ambulatory Visit: Payer: Self-pay | Admitting: Licensed Clinical Social Worker

## 2022-04-12 NOTE — Patient Outreach (Signed)
  Care Coordination   04/12/2022 Name: Raven Torres MRN: [id number] DOB: 09-14-1955   Care Coordination Outreach Attempts:  An unsuccessful telephone outreach was attempted today to offer the patient information about available care coordination services as a benefit of their health plan.   Follow Up Plan:  Additional outreach attempts will be made to offer the patient care coordination information and services.   Encounter Outcome:  No Answer  Care Coordination Interventions Activated:  No   Care Coordination Interventions:  No, not indicated    Christa See, MSW, [email or web address] Phone [phone] 4:10 PM

## 2022-04-16 ENCOUNTER — Ambulatory Visit: Payer: Medicare Other | Admitting: Internal Medicine

## 2022-04-16 ENCOUNTER — Telehealth: Payer: Self-pay | Admitting: Student

## 2022-04-16 NOTE — Telephone Encounter (Signed)
She'll need to have Super D CT Chest prior to clinic visit. Is it still possible to have this done before her appointment 9/14? Otherwise would postpone clinic visit.  Thanks!

## 2022-04-16 NOTE — Progress Notes (Deleted)
Synopsis: Referred for *** by Ladell Pier, MD  Subjective:   PATIENT ID: Raven Torres GENDER: female DOB: 05/01/1956, MRN: [id number]  No chief complaint on file.  66yF with past medical history significant for hypertension, rheumatoid arthritis, hepatitis C induced cirrhosis  s/p chemo radiation with weekly cisplatin for 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  Recent admission for AECOPD, CAP, acute right parietal and bilateral occipital infarcts now on aspirin 81 mg daily  Otherwise pertinent review of systems is negative.  Past Medical History:  Diagnosis Date   Arthritis    Cataract    removed both eyes    CHF (congestive heart failure) (HCC)    Colitis    COPD (chronic obstructive pulmonary disease) ([place])    Hepatitis C    Hyperlipidemia    Hypertension    Shingles 03/27/2020   TB (tuberculosis)    treatment      Family History  Problem Relation Age of Onset   Heart disease Mother    Cancer Mother    Melanoma Mother    Esophageal cancer Father    Colon cancer Neg Hx    Colon polyps Neg Hx    Rectal cancer Neg Hx    Stomach cancer Neg Hx      Past Surgical History:  Procedure Laterality Date   ABDOMINAL HYSTERECTOMY     CATARACT EXTRACTION, BILATERAL     COLONOSCOPY     COLONOSCOPY  04/04/2020   DIRECT LARYNGOSCOPY N/A 06/26/2020   Procedure: DIRECT LARYNGOSCOPY AND BIOPSY;  Surgeon: Rozetta Nunnery, MD;  Location: [facility] OR;  Service: ENT;  Laterality: N/A;   IR GASTROSTOMY TUBE MOD SED  07/27/2020   IR GASTROSTOMY TUBE REMOVAL  04/17/2021   IR IMAGING GUIDED PORT INSERTION  07/27/2020   IR REMOVAL TUN ACCESS W/ PORT W/O FL MOD SED  03/20/2021   KNEE SURGERY Right    POLYPECTOMY  11/17/2008   HPP x 1     Social History   Socioeconomic History   Marital status: Married    Spouse name: Not on file   Number of children: Not on file   Years of education: Not on file   Highest education level: Not on file   Occupational History   Not on file  Tobacco Use   Smoking status: Every Day    Packs/day: 1.00    Years: 51.00    Total pack years: 51.00    Types: Cigarettes    Start date: 2   Smokeless tobacco: Never   Tobacco comments:    4 cigs a day   Vaping Use   Vaping Use: Never used  Substance and Sexual Activity   Alcohol use: No    Alcohol/week: 0.0 standard drinks of alcohol   Drug use: No   Sexual activity: Not Currently  Other Topics Concern   Not on file  Social History Narrative   Not on file   Social Determinants of Health   Financial Resource Strain: Not on file  Food Insecurity: No Food Insecurity (07/11/2020)   Hunger Vital Sign    Worried About Running Out of Food in the Last Year: Never true    Ran Out of Food in the Last Year: Never true  Transportation Needs: No Transportation Needs (07/11/2020)   PRAPARE - Hydrologist (Medical): No    Lack of Transportation (Non-Medical): No  Physical Activity: Not on file  Stress: Not on file  Social Connections: Not on file  Intimate Partner Violence: Not on file     Allergies  Allergen Reactions   Tudorza Pressair [Aclidinium Bromide] Itching   Amitriptyline Other (See Comments)    Eye spasms per pt   Codeine Nausea Only   Tramadol Nausea Only     Outpatient Medications Prior to Visit  Medication Sig Dispense Refill   albuterol (PROVENTIL) (2.5 MG/3ML) 0.083% nebulizer solution Take 3 mLs (2.5 mg total) by nebulization every 6 (six) hours as needed for wheezing or shortness of breath. 150 mL 1   albuterol (VENTOLIN HFA) 108 (90 Base) MCG/ACT inhaler INHALE 2 PUFFS INTO THE LUNGS EVERY 6 HOURS AS NEEDED FOR WHEEZING OR SHORTNESS OF BREATH (Patient taking differently: Inhale 2 puffs into the lungs every 6 (six) hours as needed for wheezing or shortness of breath.) 8.5 g 3   ALPRAZolam (XANAX) 0.25 MG tablet Take 1 tablet (0.25 mg total) by mouth 2 (two) times daily as needed for anxiety.  30 tablet 0   aspirin 81 MG chewable tablet Chew 1 tablet (81 mg total) by mouth daily. 30 tablet 0   atorvastatin (LIPITOR) 40 MG tablet Take 1 tablet (40 mg total) by mouth at bedtime. 30 tablet 0   feeding supplement (ENSURE ENLIVE / ENSURE PLUS) LIQD Take 237 mLs by mouth 5 (five) times daily.     Glycopyrrolate-Formoterol (BEVESPI AEROSPHERE) 9-4.8 MCG/ACT AERO Inhale 2 puffs into the lungs 2 (two) times daily. 10.7 g 5   lidocaine (XYLOCAINE) 2 % solution Use as directed 15 mLs in the mouth or throat in the morning, at noon, in the evening, and at bedtime. Patient: Mix 1part 2% viscous lidocaine, 1part H20. Swallow 66m of diluted mixture, 377m before meals and at bedtime, up to QID     No facility-administered medications prior to visit.       Objective:   Physical Exam:  General appearance: [age].o[age] female, NAD, conversant  Eyes: anicteric sclerae; PERRL, tracking appropriately HENT: NCAT; MMM Neck: Trachea midline; no lymphadenopathy, no JVD Lungs: CTAB, no crackles, no wheeze, with normal respiratory effort CV: RRR, no murmur  Abdomen: Soft, non-tender; non-distended, BS present  Extremities: No peripheral edema, warm Skin: Normal turgor and texture; no rash Psych: Appropriate affect Neuro: Alert and oriented to person and place, no focal deficit     There were no vitals filed for this visit.   on *** LPM *** RA BMI Readings from Last 3 Encounters:  04/03/22 15.74 kg/m  04/01/22 15.53 kg/m  03/26/22 15.49 kg/m   Wt Readings from Last 3 Encounters:  04/03/22 97 lb 8 oz (44.2 kg)  04/01/22 96 lb 3.2 oz (43.6 kg)  03/26/22 96 lb (43.5 kg)     CBC    Component Value Date/Time   WBC 5.6 03/11/2022 1439   WBC 10.5 02/07/2022 0612   RBC 4.36 03/11/2022 1439   RBC 4.42 02/07/2022 0612   HGB 13.3 03/11/2022 1439   HCT 39.4 03/11/2022 1439   PLT 139 (L) 03/11/2022 1439   MCV 90 03/11/2022 1439   MCH 30.5 03/11/2022 1439   MCH 30.3 02/07/2022 0612   MCHC  33.8 03/11/2022 1439   MCHC 31.4 02/07/2022 0612   RDW 13.6 03/11/2022 1439   LYMPHSABS 0.6 (L) 02/02/2022 0640   LYMPHSABS 2.9 11/19/2016 1223   MONOABS 0.7 02/02/2022 0640   EOSABS 0.0 02/02/2022 0640   EOSABS 0.2 11/19/2016 1223   BASOSABS 0.0 02/02/2022 0640   BASOSABS 0.0 11/19/2016 1223    ***  Chest Imaging: ***  Pulmonary Functions Testing Results:    Latest Ref Rng & Units 03/25/2018    9:52 AM  PFT Results  FVC-Pre L 1.84   FVC-Predicted Pre % 55   FVC-Post L 2.17   FVC-Predicted Post % 65   Pre FEV1/FVC % % 44   Post FEV1/FCV % % 46   FEV1-Pre L 0.80   FEV1-Predicted Pre % 31   FEV1-Post L 1.00   DLCO uncorrected ml/min/mmHg 12.03   DLCO UNC% % 46   DLVA Predicted % 58   TLC L 7.26   TLC % Predicted % 139   RV % Predicted % 234     FeNO: ***  Pathology: ***  Echocardiogram: ***  Heart Catheterization: ***    Assessment & Plan:    Plan:      Maryjane Hurter, MD [facility] 04/16/2022 6:00 PM

## 2022-04-18 ENCOUNTER — Institutional Professional Consult (permissible substitution): Payer: Medicare Other | Admitting: Student

## 2022-04-25 ENCOUNTER — Telehealth: Payer: Self-pay | Admitting: Diagnostic Neuroimaging

## 2022-04-25 NOTE — Telephone Encounter (Signed)
Pt's daughter called stating that the pt's headaches are getting more frequent and intense but short but pt is worried because her veins in her eyes keep bursting and she is having the bloodshot look in her eyes. Please advise.

## 2022-04-25 NOTE — Telephone Encounter (Signed)
Contacted pt daughter back, she report more frequent headaches now and bloodshot eyes. Pt thinks she is having another stroke and bleeding from the brain. Daughter feels her mom is having anxiety and just wanted her neurologist be aware and see if there is anything to advise for the headaches. She rq call be sent to Dr Leta Baptist and not work upon his return next week.  I did inform her if pt does have worsening symptoms, any other stroke like symptoms such as facial drooping, onset numbness, loss of vision, slurred speech or anything to seek emergency assistance. She verbally understood and was appreciative.

## 2022-05-02 ENCOUNTER — Encounter (HOSPITAL_COMMUNITY): Payer: Self-pay

## 2022-05-02 ENCOUNTER — Ambulatory Visit (HOSPITAL_COMMUNITY)
Admission: RE | Admit: 2022-05-02 | Discharge: 2022-05-02 | Disposition: A | Discharge status: Home / Self Care | Payer: Medicare Other | Source: Ambulatory Visit | Attending: Student | Admitting: Student

## 2022-05-02 DIAGNOSIS — I700 Atherosclerosis of aorta: Secondary | ICD-10-CM | POA: Diagnosis not present

## 2022-05-02 DIAGNOSIS — R918 Other nonspecific abnormal finding of lung field: Secondary | ICD-10-CM | POA: Insufficient documentation

## 2022-05-02 DIAGNOSIS — R911 Solitary pulmonary nodule: Secondary | ICD-10-CM | POA: Diagnosis not present

## 2022-05-02 DIAGNOSIS — J439 Emphysema, unspecified: Secondary | ICD-10-CM | POA: Diagnosis not present

## 2022-05-10 ENCOUNTER — Encounter: Payer: Self-pay | Admitting: Internal Medicine

## 2022-05-10 ENCOUNTER — Ambulatory Visit: Payer: Medicare Other | Attending: Internal Medicine | Admitting: Internal Medicine

## 2022-05-10 VITALS — BP 121/66 | HR 72 | Ht 66.0 in | Wt 96.8 lb

## 2022-05-10 DIAGNOSIS — F17200 Nicotine dependence, unspecified, uncomplicated: Secondary | ICD-10-CM | POA: Diagnosis not present

## 2022-05-10 DIAGNOSIS — L299 Pruritus, unspecified: Secondary | ICD-10-CM

## 2022-05-10 DIAGNOSIS — I6930 Unspecified sequelae of cerebral infarction: Secondary | ICD-10-CM

## 2022-05-10 DIAGNOSIS — H6990 Unspecified Eustachian tube disorder, unspecified ear: Secondary | ICD-10-CM

## 2022-05-10 DIAGNOSIS — E43 Unspecified severe protein-calorie malnutrition: Secondary | ICD-10-CM | POA: Diagnosis not present

## 2022-05-10 DIAGNOSIS — Z2821 Immunization not carried out because of patient refusal: Secondary | ICD-10-CM | POA: Diagnosis not present

## 2022-05-10 DIAGNOSIS — R9389 Abnormal findings on diagnostic imaging of other specified body structures: Secondary | ICD-10-CM

## 2022-05-10 NOTE — Patient Instructions (Signed)
Purchase hydrocortisone cream 1% over-the-counter.  You can use a little on a Q-tip and apply to the entry of both ear canal about once or twice a week to help decrease itching.  You can purchase Sudafed over-the-counter and use once a week as needed to help decrease the sensation that you have in the ears.

## 2022-05-10 NOTE — Progress Notes (Signed)
Patient ID: Raven Torres, female    DOB: Jun 25, 1956  MRN: [id number]  CC: Hypertension   Subjective: Raven Torres is a 66 y.o. female who presents for chronic ds management Her concerns today include:  Patient with history of supraglottic laryngeal CA (status post chemoradiation with end of treatment PET/CT with complete response), HTN, cured hepatitis C with cirrhosis, adjustment disorder, HL, tob dep, and rheumatoid arthritis/fibromyalgia (Dr. Posey Pronto at [facility]) , memory decline (MMSE 05/2019 was 30/30), COPD GOLD III, lung nodules (Dr. 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  Currently on ASA only Did a few home PT sessions and then stopped it.  Did not feel she needed it anymore.  Reports that the strength in the left wrist is a lot better.  She is doing home exercises herself including repetitive lifting a 5 pound weight.  She saw the cardiologist.  Decided against placement of implanted loop recorder.  She also saw neurologist Dr. Leta Baptist the end of August.  Wgh stable since her last visit with me in August.  She tells me that she still tries to eat as much as she can.  She has been fitted already for new dentures and waiting for them to come in.  She thinks that will help in allowing her to chew certain foods.  She continues to supplement her meals with shakes.  Complains of intermittent itching in both ears.  Reports that sometimes she can hear herself breathing in both ears.  Patient continues to smoke. She had repeat CT scan of the chest for follow-up on lung nodules.  This was read as: IMPRESSION: 1. Waxing and waning peribronchovascular ground-glass nodularity and nodular consolidation when compared  with 02/05/2022, findings indicative of an atypical/fungal infectious etiology. 2. Right renal stone. 3. Aortic atherosclerosis (ICD10-I70.0). Coronary artery calcification. 4.  Emphysema (ICD10-J43.9). She was supposed to have a follow-up appointment with pulmonologist Dr. Verlee Monte after having the CT but has not heard back from his office for appt.  HM: declines flu vaccine Patient Active Problem List   Diagnosis Date Noted   Hyperlipidemia 03/26/2022   Multiple lung nodules on CT 02/06/2022   Infection due to parainfluenza virus 4 02/06/2022   Acute respiratory failure with hypoxia (HCC) 02/02/2022   Acute ischemic stroke ([place]) 02/01/2022   Pressure injury of skin 01/31/2022   Pneumonia 01/30/2022   Protein-calorie malnutrition, severe 09/13/2020   Hypotension 09/12/2020   Tobacco dependence 08/11/2020   Malignant neoplasm of supraglottis ([place]) 07/13/2020   Herpes zoster without complication 76/81/1572   GOLD COPD III B 04/23/2018   Pruritic dermatitis 04/23/2018   High cholesterol 06/24/2017   Shortness of breath 06/04/2017   Coronary artery calcification 06/04/2017   Other fatigue 06/04/2017   Centrilobular emphysema ([place]) 05/15/2017   Atherosclerosis of arteries 05/15/2017   Poor memory 05/15/2017   Cigarette nicotine dependence without complication 62/10/5595   Lung nodules 04/16/2017   TB lung, latent 01/22/2017   Other cirrhosis of liver ([place]) 01/22/2017   Long-term use of high-risk medication 12/31/2016   Rheumatoid arthritis involving multiple sites with positive rheumatoid factor ([place]) 12/31/2016   Diabetes mellitus screening 11/19/2016   Fibromyalgia 05/15/2015   Rheumatoid arthritis ([place]) 08/22/2014   HTN (hypertension) 08/22/2014   Tobacco abuse 08/22/2014  Hepatic cirrhosis ([place]) 05/31/2014   Chronic hepatitis C virus infection ([place]) 05/03/2014     Current Outpatient Medications on File Prior to Visit  Medication Sig Dispense Refill   albuterol  (PROVENTIL) (2.5 MG/3ML) 0.083% nebulizer solution Take 3 mLs (2.5 mg total) by nebulization every 6 (six) hours as needed for wheezing or shortness of breath. 150 mL 1   albuterol (VENTOLIN HFA) 108 (90 Base) MCG/ACT inhaler INHALE 2 PUFFS INTO THE LUNGS EVERY 6 HOURS AS NEEDED FOR WHEEZING OR SHORTNESS OF BREATH (Patient taking differently: Inhale 2 puffs into the lungs every 6 (six) hours as needed for wheezing or shortness of breath.) 8.5 g 3   ALPRAZolam (XANAX) 0.25 MG tablet Take 1 tablet (0.25 mg total) by mouth 2 (two) times daily as needed for anxiety. 30 tablet 0   aspirin 81 MG chewable tablet Chew 1 tablet (81 mg total) by mouth daily. 30 tablet 0   atorvastatin (LIPITOR) 40 MG tablet Take 1 tablet (40 mg total) by mouth at bedtime. 30 tablet 0   feeding supplement (ENSURE ENLIVE / ENSURE PLUS) LIQD Take 237 mLs by mouth 5 (five) times daily.     Glycopyrrolate-Formoterol (BEVESPI AEROSPHERE) 9-4.8 MCG/ACT AERO Inhale 2 puffs into the lungs 2 (two) times daily. 10.7 g 5   lidocaine (XYLOCAINE) 2 % solution Use as directed 15 mLs in the mouth or throat in the morning, at noon, in the evening, and at bedtime. Patient: Mix 1part 2% viscous lidocaine, 1part H20. Swallow 29m of diluted mixture, 348m before meals and at bedtime, up to QID     No current facility-administered medications on file prior to visit.    Allergies  Allergen Reactions   Tudorza Pressair [Aclidinium Bromide] Itching   Amitriptyline Other (See Comments)    Eye spasms per pt   Codeine Nausea Only   Tramadol Nausea Only    Social History   Socioeconomic History   Marital status: Married    Spouse name: Not on file   Number of children: Not on file   Years of education: Not on file   Highest education level: Not on file  Occupational History   Not on file  Tobacco Use   Smoking status: Every Day    Packs/day: 1.00    Years: 51.00    Total pack years: 51.00    Types: Cigarettes    Start date: [birth year]  Smokeless tobacco: Never   Tobacco comments:    4 cigs a day   Vaping Use   Vaping Use: Never used  Substance and Sexual Activity   Alcohol use: No    Alcohol/week: 0.0 standard drinks of alcohol   Drug use: No   Sexual activity: Not Currently  Other Topics Concern   Not on file  Social History Narrative   Not on file   Social Determinants of Health   Financial Resource Strain: Not on file  Food Insecurity: No Food Insecurity (07/11/2020)   Hunger Vital Sign    Worried About Running Out of Food in the Last Year: Never true    Ran Out of Food in the Last Year: Never true  Transportation Needs: No Transportation Needs (07/11/2020)   PRAPARE - TrHydrologistMedical): No    Lack of Transportation (Non-Medical): No  Physical Activity: Not on file  Stress: Not on file  Social Connections: Not on file  Intimate Partner Violence: Not on file    Family History  Problem  Relation Age of Onset   Heart disease Mother    Cancer Mother    Melanoma Mother    Esophageal cancer Father    Colon cancer Neg Hx    Colon polyps Neg Hx    Rectal cancer Neg Hx    Stomach cancer Neg Hx     Past Surgical History:  Procedure Laterality Date   ABDOMINAL HYSTERECTOMY     CATARACT EXTRACTION, BILATERAL     COLONOSCOPY     COLONOSCOPY  04/04/2020   DIRECT LARYNGOSCOPY N/A 06/26/2020   Procedure: DIRECT LARYNGOSCOPY AND BIOPSY;  Surgeon: Rozetta Nunnery, MD;  Location[facility];  Service: ENT;  Laterality: N/A;   IR GASTROSTOMY TUBE MOD SED  07/27/2020   IR GASTROSTOMY TUBE REMOVAL  04/17/2021   IR IMAGING GUIDED PORT INSERTION  07/27/2020   IR REMOVAL TUN ACCESS W/ PORT W/O FL MOD SED  03/20/2021   KNEE SURGERY Right    POLYPECTOMY  11/17/2008   HPP x 1     ROS: Review of Systems Negative except as stated above  PHYSICAL EXAM: BP 121/66   Pulse 72   Ht '5\' 6"'$  (1.676 m)   Wt 96 lb 12.8 oz (43.9 kg)   SpO2 94%   BMI 15.62 kg/m   Wt Readings from Last 3  Encounters:  05/10/22 96 lb 12.8 oz (43.9 kg)  04/03/22 97 lb 8 oz (44.2 kg)  04/01/22 96 lb 3.2 oz (43.6 kg)    Physical Exam  General appearance -frail, underweight, elderly Caucasian female in NAD Mental status -patient is a bit forgetful. Neck - supple, no significant adenopathy Chest -breath sounds decreased bilaterally without wheezes or crackles. Heart - normal rate, regular rhythm, normal S1, S2, no murmurs, rubs, clicks or gallops Extremities -no lower extremity edema. Neuro: Grip 5/5 on the right, 4/5 on the left. Patient ambulates unassisted.     Latest Ref Rng & Units 03/11/2022    2:39 PM 02/07/2022    6:12 AM 02/06/2022    5:11 AM  CMP  Glucose 70 - 99 mg/dL 76  92    BUN 8 - 27 mg/dL 8  29    Creatinine 0.57 - 1.00 mg/dL 0.64  0.44  0.49   Sodium 134 - 144 mmol/L 143  143    Potassium 3.5 - 5.2 mmol/L 4.1  4.2    Chloride 96 - 106 mmol/L 104  103    CO2 20 - 29 mmol/L 28  36    Calcium 8.7 - 10.3 mg/dL 9.4  9.0    Total Protein 6.0 - 8.5 g/dL 6.8  6.0    Total Bilirubin 0.0 - 1.2 mg/dL 0.3  0.4    Alkaline Phos 44 - 121 IU/L 60  48    AST 0 - 40 IU/L 28  31    ALT 0 - 32 IU/L 17  28     Lipid Panel     Component Value Date/Time   CHOL 144 02/01/2022 0510   CHOL 133 06/02/2020 0845   TRIG 65 02/01/2022 0510   HDL 40 (L) 02/01/2022 0510   HDL 67 06/02/2020 0845   CHOLHDL 3.6 02/01/2022 0510   VLDL 13 02/01/2022 0510   LDLCALC 91 02/01/2022 0510   LDLCALC 50 06/02/2020 0845    CBC    Component Value Date/Time   WBC 5.6 03/11/2022 1439   WBC 10.5 02/07/2022 0612   RBC 4.36 03/11/2022 1439   RBC 4.42 02/07/2022 0612  HGB 13.3 03/11/2022 1439   HCT 39.4 03/11/2022 1439   PLT 139 (L) 03/11/2022 1439   MCV 90 03/11/2022 1439   MCH 30.5 03/11/2022 1439   MCH 30.3 02/07/2022 0612   MCHC 33.8 03/11/2022 1439   MCHC 31.4 02/07/2022 0612   RDW 13.6 03/11/2022 1439   LYMPHSABS 0.6 (L) 02/02/2022 0640   LYMPHSABS 2.9 11/19/2016 1223   MONOABS 0.7  02/02/2022 0640   EOSABS 0.0 02/02/2022 0640   EOSABS 0.2 11/19/2016 1223   BASOSABS 0.0 02/02/2022 0640   BASOSABS 0.0 11/19/2016 1223    ASSESSMENT AND PLAN: 1. Severe protein-calorie malnutrition ([place]) Patient encouraged to continue trying to eat smaller but more frequent meals and supplementing meals with shakes.  She hopes to get her new pair of dentures soon.  2. History of cerebrovascular accident (CVA) with residual deficit Continue aspirin atorvastatin.  Patient declines placement of planted loop recorder.  Strongly advised to discontinue smoking.  3. Abnormal CT of the chest I will send an inbox message to Dr.Meier to see about getting her in for f/u CT results  4. Tobacco dependence Strongly advised to quit smoking.  5. Influenza vaccination declined Recommended.  Patient declined.  6. Itching of ear Recommend using over-the-counter hydrocortisone cream on a Q-tip and applying a little to the entrance of the ear canal once or twice a week as needed  7. Disorder of Eustachian tube, unspecified laterality Recommend using Sudafed once a week as needed.    Patient was given the opportunity to ask questions.  Patient verbalized understanding of the plan and was able to repeat key elements of the plan.   This documentation was completed using Radio producer.  Any transcriptional errors are unintentional.  No orders of the defined types were placed in this encounter.    Requested Prescriptions    No prescriptions requested or ordered in this encounter    Return in about 4 months (around 09/10/2022).  Karle Plumber, MD, FACP

## 2022-05-10 NOTE — Progress Notes (Signed)
Pt needs RF on Atorvastatin '40mg'$ .

## 2022-05-13 NOTE — Telephone Encounter (Signed)
Can we schedule appointment with me next available? Reviewed CT and I agree - infectious/inflammatory process far more likely than cancer. Happy to discuss at next appointment.

## 2022-05-13 NOTE — Telephone Encounter (Signed)
Pt has had this done

## 2022-05-13 NOTE — Telephone Encounter (Signed)
-----   Message from Ladell Pier, MD sent at 05/10/2022  9:50 PM EDT ----- [name] the PCP for this patient.  You recently ordered a repeat CAT scan of her chest for follow-up on lung nodules.  She was supposed to have a follow-up appointment with you after she had the CAT scan done.  However she has not received an appointment from your office.  I told her I would touch base with you to make sure that you have reviewed the CAT scan report and to determine need for follow-up.  Thank you.

## 2022-05-20 ENCOUNTER — Telehealth: Payer: Self-pay

## 2022-05-20 NOTE — Telephone Encounter (Signed)
(  2:10 pm) PC SW completed a follow-up call to patient's daughter who was requesting a visit with patient. RN/SW team will see patient on 03/28/22 @ 9am.

## 2022-05-24 ENCOUNTER — Ambulatory Visit: Payer: Medicare Other | Attending: Internal Medicine

## 2022-05-24 DIAGNOSIS — Z0000 Encounter for general adult medical examination without abnormal findings: Secondary | ICD-10-CM

## 2022-05-24 NOTE — Patient Instructions (Signed)

## 2022-05-24 NOTE — Progress Notes (Deleted)
Subjective:   Raven Torres is a 66 y.o. female who presents for Medicare Annual (Subsequent) preventive examination.  I connected with  JIZEL CHEEKS on 05/24/22 by a {Video Enabled:26378::"video and audio"} enabled telemedicine application and verified that [name] speaking with the correct person using two identifiers.  {Patient Location:[phone]::"Home"}  {Provider Location:[id number]::"Home Office"}  I discussed the limitations of evaluation and management by telemedicine. The patient expressed understanding and agreed to proceed.         Objective:    There were no vitals filed for this visit. There is no height or weight on file to calculate BMI.     01/30/2022    9:31 AM 01/11/2021   10:00 AM 10/10/2020    3:13 PM 09/12/2020    5:23 PM 09/12/2020    2:28 PM 09/07/2020    9:44 AM 09/05/2020    9:49 AM  Advanced Directives  Does Patient Have a Medical Advance Directive? No No No No Unable to assess, patient is non-responsive or altered mental status No No  Does patient want to make changes to medical advance directive?    No - Patient declined No - Patient declined No - Patient declined   Would patient like information on creating a medical advance directive? No - Patient declined No - Patient declined No - Patient declined No - Patient declined  No - Patient declined No - Patient declined    Current Medications (verified) Outpatient Encounter Medications as of 05/24/2022  Medication Sig   albuterol (PROVENTIL) (2.5 MG/3ML) 0.083% nebulizer solution Take 3 mLs (2.5 mg total) by nebulization every 6 (six) hours as needed for wheezing or shortness of breath.   albuterol (VENTOLIN HFA) 108 (90 Base) MCG/ACT inhaler INHALE 2 PUFFS INTO THE LUNGS EVERY 6 HOURS AS NEEDED FOR WHEEZING OR SHORTNESS OF BREATH (Patient taking differently: Inhale 2 puffs into the lungs every 6 (six) hours as needed for wheezing or shortness of breath.)   ALPRAZolam (XANAX) 0.25 MG tablet Take 1 tablet  (0.25 mg total) by mouth 2 (two) times daily as needed for anxiety.   aspirin 81 MG chewable tablet Chew 1 tablet (81 mg total) by mouth daily.   atorvastatin (LIPITOR) 40 MG tablet Take 1 tablet (40 mg total) by mouth at bedtime.   feeding supplement (ENSURE ENLIVE / ENSURE PLUS) LIQD Take 237 mLs by mouth 5 (five) times daily.   Glycopyrrolate-Formoterol (BEVESPI AEROSPHERE) 9-4.8 MCG/ACT AERO Inhale 2 puffs into the lungs 2 (two) times daily.   lidocaine (XYLOCAINE) 2 % solution Use as directed 15 mLs in the mouth or throat in the morning, at noon, in the evening, and at bedtime. Patient: Mix 1part 2% viscous lidocaine, 1part H20. Swallow 54m of diluted mixture, 332m before meals and at bedtime, up to QID   No facility-administered encounter medications on file as of 05/24/2022.    Allergies (verified) Tudorza pressair [aclidinium bromide], Amitriptyline, Codeine, and Tramadol   History: Past Medical History:  Diagnosis Date   Arthritis    Cataract    removed both eyes    CHF (congestive heart failure) (HCC)    Colitis    COPD (chronic obstructive pulmonary disease) ([place]   Hepatitis C    Hyperlipidemia    Hypertension    Shingles 03/27/2020   TB (tuberculosis)    treatment    Past Surgical History:  Procedure Laterality Date   ABDOMINAL HYSTERECTOMY     CATARACT EXTRACTION, BILATERAL     COLONOSCOPY  COLONOSCOPY  04/04/2020   DIRECT LARYNGOSCOPY N/A 06/26/2020   Procedure: DIRECT LARYNGOSCOPY AND BIOPSY;  Surgeon: Rozetta Nunnery, MD;  Location: [facility];  Service: ENT;  Laterality: N/A;   IR GASTROSTOMY TUBE MOD SED  07/27/2020   IR GASTROSTOMY TUBE REMOVAL  04/17/2021   IR IMAGING GUIDED PORT INSERTION  07/27/2020   IR REMOVAL TUN ACCESS W/ PORT W/O FL MOD SED  03/20/2021   KNEE SURGERY Right    POLYPECTOMY  11/17/2008   HPP x 1    Family History  Problem Relation Age of Onset   Heart disease Mother    Cancer Mother    Melanoma Mother    Esophageal  cancer Father    Colon cancer Neg Hx    Colon polyps Neg Hx    Rectal cancer Neg Hx    Stomach cancer Neg Hx    Social History   Socioeconomic History   Marital status: Married    Spouse name: Not on file   Number of children: Not on file   Years of education: Not on file   Highest education level: Not on file  Occupational History   Not on file  Tobacco Use   Smoking status: Every Day    Packs/day: 1.00    Years: 51.00    Total pack years: 51.00    Types: Cigarettes    Start date: 67   Smokeless tobacco: Never   Tobacco comments:    4 cigs a day   Vaping Use   Vaping Use: Never used  Substance and Sexual Activity   Alcohol use: No    Alcohol/week: 0.0 standard drinks of alcohol   Drug use: No   Sexual activity: Not Currently  Other Topics Concern   Not on file  Social History Narrative   Not on file   Social Determinants of Health   Financial Resource Strain: Not on file  Food Insecurity: No Food Insecurity (07/11/2020)   Hunger Vital Sign    Worried About Running Out of Food in the Last Year: Never true    Ran Out of Food in the Last Year: Never true  Transportation Needs: No Transportation Needs (07/11/2020)   PRAPARE - Hydrologist (Medical): No    Lack of Transportation (Non-Medical): No  Physical Activity: Not on file  Stress: Not on file  Social Connections: Not on file    Tobacco Counseling Ready to quit: Not Answered Counseling given: Not Answered Tobacco comments: 4 cigs a day    Clinical Intake:                 Diabetic?***         Activities of Daily Living    01/30/2022    4:57 PM  In your present state of health, do you have any difficulty performing the following activities:  Hearing? 0  Vision? 0  Difficulty concentrating or making decisions? 0  Walking or climbing stairs? 1  Dressing or bathing? 1  Doing errands, shopping? 1    Patient Care Team: Ladell Pier, MD as PCP -  General (Internal Medicine) Debara Pickett Nadean Corwin, MD as PCP - Cardiology (Cardiology) Eppie Gibson, MD as Consulting Physician (Radiation Oncology) Benay Pike, MD as Consulting Physician (Hematology and Oncology) [place], Stephani Police, RN as Oncology Nurse Navigator  Indicate any recent Medical Services you may have received from other than [facility] providers in the past year (date may be approximate).     Assessment:  This is a routine wellness examination for [facility].  Hearing/Vision screen No results found.  Dietary issues and exercise activities discussed:     Goals Addressed   None   Depression Screen    03/11/2022    1:46 PM 04/13/2021   10:52 AM 01/11/2021   10:00 AM 12/11/2020   11:40 AM 08/11/2020    9:02 AM 06/08/2020   11:16 AM 01/07/2020    8:41 AM  PHQ 2/9 Scores  PHQ - 2 Score 1 0 0 1 1 0 0  PHQ- 9 Score '3   4  1     '$ Fall Risk    05/10/2022   11:10 AM 03/11/2022    1:46 PM 04/13/2021   10:52 AM 01/11/2021   10:00 AM 12/11/2020    9:43 AM  Fall Risk   Falls in the past year? 0 0 0 0 0  Number falls in past yr: 0 0 0 0 0  Injury with Fall? 0 0 0 0 0  Risk for fall due to : No Fall Risks No Fall Risks No Fall Risks    Follow up Falls evaluation completed Falls evaluation completed       FALL RISK PREVENTION PERTAINING TO THE HOME:  Any stairs in or around the home? {YES/NO:21197} If so, are there any without handrails? {YES/NO:21197} Home free of loose throw rugs in walkways, pet beds, electrical cords, etc? {YES/NO:21197} Adequate lighting in your home to reduce risk of falls? {YES/NO:21197}  ASSISTIVE DEVICES UTILIZED TO PREVENT FALLS:  Life alert? {YES/NO:21197} Use of a cane, walker or w/c? {YES/NO:21197} Grab bars in the bathroom? {YES/NO:21197} Shower chair or bench in shower? {YES/NO:21197} Elevated toilet seat or a handicapped toilet? {YES/NO:21197}  TIMED UP AND GO:  Was the test performed? {YES/NO:21197}.  Length of time to ambulate 10 feet: *** sec.    {Appearance of NUUV:2536644}  Cognitive Function:    01/11/2021   10:01 AM 05/07/2019   11:02 AM 07/22/2017    4:42 PM  MMSE - Mini Mental State Exam  Orientation to time '5 5 5  '$ Orientation to Place '5 5 5  '$ Registration '3 3 3  '$ Attention/ Calculation '5 5 5  '$ Recall '3 3 2  '$ Language- name 2 objects '2 2 2  '$ Language- repeat '1 1 1  '$ Language- follow 3 step command '3 3 3  '$ Language- read & follow direction '1 1 1  '$ Write a sentence '1 1 1  '$ Copy design '1 1 1  '$ Total score '30 30 29        '$ Immunizations Immunization History  Administered Date(s) Administered   Hepatitis A, Adult 05/03/2014, 10/27/2014   Influenza, Seasonal, Injecte, Preservative Fre 05/03/2014, 05/15/2015   Influenza,inj,Quad PF,6+ Mos 05/03/2014, 05/15/2015, 05/15/2017, 04/23/2018, 05/07/2019, 04/13/2021   Influenza-Unspecified 04/27/2020   PFIZER(Purple Top)SARS-COV-2 Vaccination 10/03/2019, 11/02/2019, 07/13/2020   Pneumococcal Conjugate-13 05/07/2019   Pneumococcal Polysaccharide-23 11/19/2016   Tdap 11/19/2016   Zoster Recombinat (Shingrix) 06/02/2020    {TDAP status:2101805}  {Flu Vaccine status:2101806}  {Pneumococcal vaccine status:2101807}  {Covid-19 vaccine status:2101808}  Qualifies for Shingles Vaccine? {YES/NO:21197}  Zostavax completed {YES/NO:21197}  {Shingrix Completed?:2101804}  Screening Tests Health Maintenance  Topic Date Due   DEXA SCAN  Never done   Zoster Vaccines- Shingrix (2 of 2) 07/28/2020   COVID-19 Vaccine (4 - Pfizer risk series) 09/07/2020   INFLUENZA VACCINE  11/03/2022 (Originally 03/05/2022)   Pneumonia Vaccine 107+ Years old (3 - PPSV23 or PCV20) 01/10/2023 (Originally 11/19/2021)   MAMMOGRAM  01/10/2023 (Originally 07/05/2021)  TETANUS/TDAP  11/20/2026   COLONOSCOPY (Pts 45-78yr Insurance coverage will need to be confirmed)  04/04/2030   Hepatitis C Screening  Completed   HPV VACCINES  Aged Out    Health Maintenance  Health Maintenance Due  Topic Date Due   DEXA  SCAN  Never done   Zoster Vaccines- Shingrix (2 of 2) 07/28/2020   COVID-19 Vaccine (4 - Pfizer risk series) 09/07/2020    {Colorectal cancer screening:2101809}  {Mammogram status:21018020}  {Bone Density status:21018021}  Lung Cancer Screening: (Low Dose CT Chest recommended if Age 91-80 years, 30 pack-year currently smoking OR have quit w/in 15years.) {DOES NOT does:27190::"does not"} qualify.   Lung Cancer Screening Referral: ***  Additional Screening:  Hepatitis C Screening: {DOES NOT does:27190::"does not"} qualify; Completed ***  Vision Screening: Recommended annual ophthalmology exams for early detection of glaucoma and other disorders of the eye. Is the patient up to date with their annual eye exam?  {YES/NO:21197} Who is the provider or what is the name of the office in which the patient attends annual eye exams? *** If pt is not established with a provider, would they like to be referred to a provider to establish care? {YES/NO:21197}.   Dental Screening: Recommended annual dental exams for proper oral hygiene  Community Resource Referral / Chronic Care Management: CRR required this visit?  {YES/NO:21197}  CCM required this visit?  {YES/NO:21197}     Plan:     I have personally reviewed and noted the following in the patient's chart:   Medical and social history Use of alcohol, tobacco or illicit drugs  Current medications and supplements including opioid prescriptions. {Opioid [phone]} Functional ability and status Nutritional status Physical activity Advanced directives List of other physicians Hospitalizations, surgeries, and ER visits in previous 12 months Vitals Screenings to include cognitive, depression, and falls Referrals and appointments  In addition, I have reviewed and discussed with patient certain preventive protocols, quality metrics, and best practice recommendations. A written personalized care plan for preventive services  as well as general preventive health recommendations were provided to patient.     BCarilyn Goodpasture RN   05/24/2022   Nurse Notes: ***

## 2022-05-28 ENCOUNTER — Other Ambulatory Visit: Payer: Medicare Other | Admitting: *Deleted

## 2022-05-28 ENCOUNTER — Other Ambulatory Visit: Payer: Medicare Other

## 2022-05-28 DIAGNOSIS — Z515 Encounter for palliative care: Secondary | ICD-10-CM

## 2022-05-29 NOTE — Progress Notes (Signed)
COMMUNITY PALLIATIVE CARE SW NOTE  PATIENT NAME: Raven Torres DOB: Aug 09, 1955 MRN: [id number]  PRIMARY CARE PROVIDER: Ladell Pier, MD  RESPONSIBLE PARTY:  Acct ID - Guarantor Home Phone Work Phone Relationship Acct Type  [id number] - [name],[name] [phone]  Self P/F     [address], [name], [place] [address]     Palliative Care SOCIAL WORK Initial Home visit  PC SW and RN-Raven Torres completed an initial palliative care visit with patient at her home. Her daughter-Raven Torres was present with her. The team provided an overview of new criteria for palliative care services to patient and her daughter. Patient and daughter verbalized understanding. Patient and her daughter provided a status update on patient's last hospitalization.   Patient reported that since her stroke, she has been more forgetful. However, patient pleasantly engaged with the team. She relied on her daughter to help fill in gaps of her story when trying to share a timeline of events regarding her medical status. Patient report that she has "aching" to her neck, left shoulder blade and arm. She reports that it will usually go away when she lays down, but if discomfort persists she will take a Tylenol or aspirin. Patient also reported neuropathy in her toes. Her appetite is fair. Patient report that she has difficulty eating due not having her dentures. She is waiting for them to arrive any day as she had to have them refitted due to weight loss. Patient report that she is currently 98 lbs, which is significantly down from 6 months ago and down from a healthy weight a year ago of around 216 lbs. Patient report that she drinks 4/5 Boost a day and is eating more softer foods. She reported no swallowing issues. Patient ambulates independently. She does report some dizziness if she gets up too fast. She also tires easily with exertion where she has to stop and take a rest. Patient report that she is napping more throughout  the day. She is independent for her ADL's. And she continues to drive herself to appointments, to shop and go out to eat with friends. Patient continues to smoke 4 to 6 cigarettes per day. Patient report some shortness or breath and uses her inhalers 2x/day.   Patient [place], [place]. She received her GED. She retired from an [employer] She has been widowed from her 31th husband 5 years. Her daughter Raven Torres is her only child and she lives in the home with [facility]. Patient's goal is to continue to be independent as long as possible and remain in her home with her family.   Patient has a follow-up appointment with her doctor ([facility]) in December.  SOCIAL HX:  Social History   Tobacco Use   Smoking status: Every Day    Packs/day: 1.00    Years: 51.00    Total pack years: 51.00    Types: Cigarettes    Start date: 14   Smokeless tobacco: Never   Tobacco comments:    4 cigs a day   Substance Use Topics   Alcohol use: No    Alcohol/week: 0.0 standard drinks of alcohol    CODE STATUS: DNR ADVANCED DIRECTIVES: Yes MOST FORM COMPLETE:  Yes HOSPICE EDUCATION PROVIDED: No  Duration of visit and documentation: 60 minutes  Raven Vultaggio, LCSW

## 2022-06-03 ENCOUNTER — Telehealth: Payer: Self-pay | Admitting: Student

## 2022-06-03 NOTE — Telephone Encounter (Signed)
Can we set up clinic appointment to review CT Chest results, next available?

## 2022-06-03 NOTE — Telephone Encounter (Signed)
LMTCB. Pt will need consult with NM as she has not been established within our practice.

## 2022-06-13 ENCOUNTER — Encounter: Payer: Self-pay | Admitting: Internal Medicine

## 2022-06-13 ENCOUNTER — Telehealth: Payer: Self-pay | Admitting: Internal Medicine

## 2022-06-13 NOTE — Telephone Encounter (Signed)
Raven Torres calling [facility] is calling to request an order for hospice care per the daughter's request. Please advise CB- [phone]  Fax- [phone]

## 2022-06-14 ENCOUNTER — Telehealth: Payer: Self-pay | Admitting: Internal Medicine

## 2022-06-14 NOTE — Telephone Encounter (Signed)
Spoke to [place] with [facility] at [phone].  She states she will reach out to family today, there may be a possibility that they can get by there today.   Gave patient's name and phone number to [facility]. No other information request by Judeen Hammans since information is in a shared electronic medical record (EPIC).   Daughter, who is listed in on DPR as Crystal goes by [employer] She was made aware that [facility] was consulted and would reach out to them. Shared phone number with Crystal for [facility] as well. Advised to call office back if needed or send MyChart message to Dr. Wynetta Emery.

## 2022-06-14 NOTE — Telephone Encounter (Signed)
Noted  

## 2022-07-24 ENCOUNTER — Ambulatory Visit: Payer: Medicare Other | Admitting: Hematology and Oncology

## 2022-08-22 ENCOUNTER — Encounter: Payer: Self-pay | Admitting: Hematology and Oncology

## 2022-09-12 ENCOUNTER — Ambulatory Visit: Payer: 59 | Attending: Internal Medicine | Admitting: Internal Medicine

## 2022-09-12 ENCOUNTER — Encounter: Payer: Self-pay | Admitting: Internal Medicine

## 2022-09-12 VITALS — BP 114/65 | HR 71 | Ht 66.0 in | Wt 93.0 lb

## 2022-09-12 DIAGNOSIS — J432 Centrilobular emphysema: Secondary | ICD-10-CM

## 2022-09-12 DIAGNOSIS — Z85819 Personal history of malignant neoplasm of unspecified site of lip, oral cavity, and pharynx: Secondary | ICD-10-CM

## 2022-09-12 DIAGNOSIS — M0579 Rheumatoid arthritis with rheumatoid factor of multiple sites without organ or systems involvement: Secondary | ICD-10-CM | POA: Diagnosis not present

## 2022-09-12 DIAGNOSIS — F17200 Nicotine dependence, unspecified, uncomplicated: Secondary | ICD-10-CM

## 2022-09-12 DIAGNOSIS — E43 Unspecified severe protein-calorie malnutrition: Secondary | ICD-10-CM

## 2022-09-12 DIAGNOSIS — C321 Malignant neoplasm of supraglottis: Secondary | ICD-10-CM

## 2022-09-12 DIAGNOSIS — Z515 Encounter for palliative care: Secondary | ICD-10-CM

## 2022-09-12 NOTE — Progress Notes (Signed)
Patient ID: Raven Torres, female    DOB: March 22, 1956  MRN: [id number]  CC: Follow-up (Malnutrition f/u. [name] a handicap sticker form. )   Subjective: Raven Torres is a 67 y.o. female who presents for chronic ds management Her concerns today include:  Patient with history of supraglottic laryngeal CA (status post chemoradiation with end of treatment PET/CT with complete response), HTN, cured hepatitis C with cirrhosis, adjustment disorder, HL, tob dep, and rheumatoid arthritis/fibromyalgia (Dr. Posey Pronto at [facility]) , memory decline (MMSE 05/2019 was 30/30), COPD GOLD III, lung nodules (Dr. 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  She appreciates the care she is receiving from them.  She is here today by herself.  She drove. She remains underweight.  States that she is eating as much as she can.  She avoids eating meats and bread as they tend to get hung up in the throat.  Drinks 4 to 5 cans of boost a day. In regards to her breathing/COPD, she uses nebulizer treatment twice a day and albuterol inhaler twice a day.  Needs refill on Bevespi inhaler.  Still smoking.  1 pack lasts about 3 to 4 days.  Finding it difficult to quit. I had referred her to pulmonary Dr. Verlee Monte on last visit for follow-up on abnormal CT of the chest that showed some peribronchovascular groundglass nodularity and nodular consolidation.  They tried calling her several times and leaving messages without any return call so the referral was closed out.  Patient does not recall whether she received message or not. In regards to her RA, states that she has not had any big flareups.  She takes half a tablet of Vicodin as needed.  She has been having some  discomfort over the left shoulder, her lower back and soreness in the left hand.  She attributes the latter more so to her stroke last year.   Patient Active Problem List   Diagnosis Date Noted   Hyperlipidemia 03/26/2022   Multiple lung nodules on CT 02/06/2022   Infection due to parainfluenza virus 4 02/06/2022   Acute respiratory failure with hypoxia (HCC) 02/02/2022   Acute ischemic stroke ([place]) 02/01/2022   Pressure injury of skin 01/31/2022   Pneumonia 01/30/2022   Protein-calorie malnutrition, severe 09/13/2020   Hypotension 09/12/2020   Tobacco dependence 08/11/2020   Malignant neoplasm of supraglottis ([place]) 07/13/2020   Herpes zoster without complication [id number]   GOLD COPD III B 04/23/2018   Pruritic dermatitis 04/23/2018   High cholesterol 06/24/2017   Shortness of breath 06/04/2017   Coronary artery calcification 06/04/2017   Other fatigue 06/04/2017   Centrilobular emphysema ([place]) 05/15/2017   Atherosclerosis of arteries 05/15/2017   Poor memory 05/15/2017   Cigarette nicotine dependence without complication [id number]   Lung nodules 04/16/2017   TB lung, latent 01/22/2017   Other cirrhosis of liver ([place]) 01/22/2017   Long-term use of high-risk medication 12/31/2016   Rheumatoid arthritis involving multiple sites with positive rheumatoid factor ([place]) 12/31/2016   Diabetes mellitus screening 11/19/2016   Fibromyalgia 05/15/2015   Rheumatoid arthritis ([place]) 08/22/2014   HTN (hypertension) 08/22/2014   Tobacco abuse 08/22/2014   Hepatic cirrhosis ([place]) 05/31/2014   Chronic hepatitis C virus infection ([place]) 05/03/2014     Current Outpatient Medications on File Prior to Visit  Medication Sig Dispense Refill   albuterol (PROVENTIL) (2.5 MG/3ML) 0.083% nebulizer solution Take 3 mLs (2.5 mg total) by nebulization every 6 (six) hours as needed for wheezing or shortness of breath. 150 mL 1   albuterol (VENTOLIN HFA) 108 (90 Base) MCG/ACT inhaler INHALE 2 PUFFS INTO  THE LUNGS EVERY 6 HOURS AS NEEDED FOR WHEEZING OR SHORTNESS OF BREATH (Patient taking differently: Inhale 2 puffs into the lungs every 6 (six) hours as needed for wheezing or shortness of breath.) 8.5 g 3   ALPRAZolam (XANAX) 0.25 MG tablet Take 1 tablet (0.25 mg total) by mouth 2 (two) times daily as needed for anxiety. 30 tablet 0   atorvastatin (LIPITOR) 40 MG tablet Take 1 tablet (40 mg total) by mouth at bedtime. 30 tablet 0   feeding supplement (ENSURE ENLIVE / ENSURE PLUS) LIQD Take 237 mLs by mouth 5 (five) times daily.     Glycopyrrolate-Formoterol (BEVESPI AEROSPHERE) 9-4.8 MCG/ACT AERO Inhale 2 puffs into the lungs 2 (two) times daily. 10.7 g 5   lidocaine (XYLOCAINE) 2 % solution Use as directed 15 mLs in the mouth or throat in the morning, at noon, in the evening, and at bedtime. Patient: Mix 1part 2% viscous lidocaine, 1part H20. Swallow 97m of diluted mixture, 375m before meals and at bedtime, up to QID     aspirin 81 MG chewable tablet Chew 1 tablet (81 mg total) by mouth daily. (Patient not taking: Reported on 09/12/2022) 30 tablet 0   No current facility-administered medications on file prior to visit.    Allergies  Allergen Reactions   Tudorza Pressair [Aclidinium Bromide] Itching   Amitriptyline Other (See Comments)    Eye spasms per pt   Codeine Nausea Only   Tramadol Nausea Only    Social History   Socioeconomic History   Marital status: Married    Spouse name: Not on file   Number of children: Not on file   Years of education: Not on file   Highest education level: Not on file  Occupational History   Not on file  Tobacco Use   Smoking status: Every Day    Packs/day: 1.00    Years: 51.00    Total pack years: 51.00    Types: Cigarettes    Start date: 1962 Smokeless tobacco: Never   Tobacco comments:    4 cigs a day   Vaping Use   Vaping Use: Never used  Substance and Sexual Activity   Alcohol use: No    Alcohol/week: 0.0 standard drinks of alcohol    Drug use: No   Sexual activity: Not Currently  Other Topics Concern   Not on file  Social History Narrative   Not on file   Social Determinants of Health   Financial Resource Strain: Not on file  Food Insecurity: No Food Insecurity (07/11/2020)   Hunger Vital Sign    Worried About Running Out of Food in the Last Year: Never true    Ran Out of Food in the Last Year: Never true  Transportation Needs: No Transportation Needs (07/11/2020)   PRAPARE - TrHydrologistMedical): No    Lack of Transportation (Non-Medical): No  Physical Activity: Not on file  Stress: Not on file  Social Connections: Not on file  Intimate Partner Violence: Not on file    Family History  Problem Relation Age of Onset   Heart disease Mother    Cancer Mother    Melanoma Mother  Esophageal cancer Father    Colon cancer Neg Hx    Colon polyps Neg Hx    Rectal cancer Neg Hx    Stomach cancer Neg Hx     Past Surgical History:  Procedure Laterality Date   ABDOMINAL HYSTERECTOMY     CATARACT EXTRACTION, BILATERAL     COLONOSCOPY     COLONOSCOPY  04/04/2020   DIRECT LARYNGOSCOPY N/A 06/26/2020   Procedure: DIRECT LARYNGOSCOPY AND BIOPSY;  Surgeon: Rozetta Nunnery, MD;  Location: [facility];  Service: ENT;  Laterality: N/A;   IR GASTROSTOMY TUBE MOD SED  07/27/2020   IR GASTROSTOMY TUBE REMOVAL  04/17/2021   IR IMAGING GUIDED PORT INSERTION  07/27/2020   IR REMOVAL TUN ACCESS W/ PORT W/O FL MOD SED  03/20/2021   KNEE SURGERY Right    POLYPECTOMY  11/17/2008   HPP x 1     ROS: Review of Systems Negative except as stated above  PHYSICAL EXAM: BP 114/65 (BP Location: Left Arm, Patient Position: Sitting, Cuff Size: Small)   Pulse 71   Ht 5' 6"$  (1.676 m)   Wt 93 lb (42.2 kg)   BMI 15.01 kg/m   Wt Readings from Last 3 Encounters:  09/12/22 93 lb (42.2 kg)  05/10/22 96 lb 12.8 oz (43.9 kg)  04/03/22 97 lb 8 oz (44.2 kg)    Physical Exam  General appearance -elderly  Caucasian female in NAD.  She has a very raspy voice.  She appears underweight and malnourished. Mental status -patient answers questions appropriately.  She has poor memory. Mouth -oral mucosa slightly dry.  Mucus noted at the back of the throat Neck - supple, no significant adenopathy Chest -breath sounds are decreased bilaterally but no wheezes or rhonchi is heard Heart - normal rate, regular rhythm, normal S1, S2, no murmurs, rubs, clicks or gallops Extremities -no lower extremity edema.      Latest Ref Rng & Units 03/11/2022    2:39 PM 02/07/2022    6:12 AM 02/06/2022    5:11 AM  CMP  Glucose 70 - 99 mg/dL 76  92    BUN 8 - 27 mg/dL 8  29    Creatinine 0.57 - 1.00 mg/dL 0.64  0.44  0.49   Sodium 134 - 144 mmol/L 143  143    Potassium 3.5 - 5.2 mmol/L 4.1  4.2    Chloride 96 - 106 mmol/L 104  103    CO2 20 - 29 mmol/L 28  36    Calcium 8.7 - 10.3 mg/dL 9.4  9.0    Total Protein 6.0 - 8.5 g/dL 6.8  6.0    Total Bilirubin 0.0 - 1.2 mg/dL 0.3  0.4    Alkaline Phos 44 - 121 IU/L 60  48    AST 0 - 40 IU/L 28  31    ALT 0 - 32 IU/L 17  28     Lipid Panel     Component Value Date/Time   CHOL 144 02/01/2022 0510   CHOL 133 06/02/2020 0845   TRIG 65 02/01/2022 0510   HDL 40 (L) 02/01/2022 0510   HDL 67 06/02/2020 0845   CHOLHDL 3.6 02/01/2022 0510   VLDL 13 02/01/2022 0510   LDLCALC 91 02/01/2022 0510   LDLCALC 50 06/02/2020 0845    CBC    Component Value Date/Time   WBC 5.6 03/11/2022 1439   WBC 10.5 02/07/2022 0612   RBC 4.36 03/11/2022 1439   RBC 4.42 02/07/2022 0612  HGB 13.3 03/11/2022 1439   HCT 39.4 03/11/2022 1439   PLT 139 (L) 03/11/2022 1439   MCV 90 03/11/2022 1439   MCH 30.5 03/11/2022 1439   MCH 30.3 02/07/2022 0612   MCHC 33.8 03/11/2022 1439   MCHC 31.4 02/07/2022 0612   RDW 13.6 03/11/2022 1439   LYMPHSABS 0.6 (L) 02/02/2022 0640   LYMPHSABS 2.9 11/19/2016 1223   MONOABS 0.7 02/02/2022 0640   EOSABS 0.0 02/02/2022 0640   EOSABS 0.2 11/19/2016 1223    BASOSABS 0.0 02/02/2022 0640   BASOSABS 0.0 11/19/2016 1223    ASSESSMENT AND PLAN:  1. Severe protein-calorie malnutrition ([place]) She will continue to boost/Ensure shakes to supplement meals.  2. Centrilobular emphysema ([place]) Refill sent on Bevespi.  Continue nebulizer treatments as needed.  3. Rheumatoid arthritis involving multiple sites with positive rheumatoid factor (HCC) Currently not on any DMARD without significant symptoms  4. Hx of Throat Cancer ([place]) Poor over all functional status and nutritional status.  On Hospice  5. Tobacco dependence Continue to encourage her to try to quit.  6. Hospice care patient   Patient was given the opportunity to ask questions.  Patient verbalized understanding of the plan and was able to repeat key elements of the plan.   This documentation was completed using Radio producer.  Any transcriptional errors are unintentional.  No orders of the defined types were placed in this encounter.    Requested Prescriptions    No prescriptions requested or ordered in this encounter    No follow-ups on file.  Karle Plumber, MD, FACP

## 2022-10-25 ENCOUNTER — Encounter: Payer: Self-pay | Admitting: Hematology and Oncology

## 2022-10-28 ENCOUNTER — Telehealth: Payer: Self-pay | Admitting: Internal Medicine

## 2022-10-28 NOTE — Telephone Encounter (Signed)
Copied from [place] [phone]. Topic: General - Inquiry >> Oct 28, 2022  2:30 PM Yolanda T wrote: Reason for CRM: Katharine Look from [facility] called stated the hospice provider can not be the attending provider and need to see if Dr Wynetta Emery would be willing to be her attending provider. They are aware that she has an upcoming appt in June. Please call Katharine Look at [phone]

## 2022-10-30 NOTE — Telephone Encounter (Signed)
noted 

## 2023-01-09 ENCOUNTER — Ambulatory Visit: Payer: 59 | Attending: Internal Medicine | Admitting: Internal Medicine

## 2023-01-09 ENCOUNTER — Encounter: Payer: Self-pay | Admitting: Internal Medicine

## 2023-01-09 ENCOUNTER — Encounter: Payer: Self-pay | Admitting: Hematology and Oncology

## 2023-01-09 VITALS — BP 110/63 | HR 87 | Temp 97.8°F | Ht 66.0 in | Wt 87.0 lb

## 2023-01-09 DIAGNOSIS — J432 Centrilobular emphysema: Secondary | ICD-10-CM

## 2023-01-09 DIAGNOSIS — F17200 Nicotine dependence, unspecified, uncomplicated: Secondary | ICD-10-CM | POA: Diagnosis not present

## 2023-01-09 DIAGNOSIS — Z515 Encounter for palliative care: Secondary | ICD-10-CM | POA: Diagnosis not present

## 2023-01-09 DIAGNOSIS — E43 Unspecified severe protein-calorie malnutrition: Secondary | ICD-10-CM

## 2023-01-09 DIAGNOSIS — R918 Other nonspecific abnormal finding of lung field: Secondary | ICD-10-CM

## 2023-01-09 DIAGNOSIS — K7469 Other cirrhosis of liver: Secondary | ICD-10-CM | POA: Diagnosis not present

## 2023-01-09 MED ORDER — ALBUTEROL SULFATE HFA 108 (90 BASE) MCG/ACT IN AERS: 2.0000 | INHALATION_SPRAY | Freq: Four times a day (QID) | RESPIRATORY_TRACT | 6 refills | Status: DC | PRN

## 2023-01-09 NOTE — Progress Notes (Signed)
Patient ID: Raven Torres, female    DOB: 1955/10/12  MRN: [id number]  CC: Weight Loss (Weight loss f/u. Vickey Sages about easily bruising /No to shingles vax. )   Subjective: Raven Torres is a 68 y.o. female who presents for chronic ds management Her concerns today include:  Patient with history of supraglottic laryngeal CA (status post chemoradiation with end of treatment PET/CT with complete response), HTN, cured hepatitis C with cirrhosis, adjustment disorder, HL, tob dep, and rheumatoid arthritis/fibromyalgia (Dr. Allena Katz at [facility]) , memory decline (MMSE 05/2019 was 30/30), COPD GOLD III, lung nodules (Dr. 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  She drove her by herself Still with Hospice; they checked on her 2x/wk.   Has been living with sister for 2-3wks, plans to move to a senior's community.  Previously living with her daughter; reports that her daughter is bipolar and recently had a psychotic breakdown and pt could not deal with it -Wgh down 6 lbs even though she reports she is eating better.  Not able to wear lower dentures because gum has receded; dentist said he would have to place screws in jaw bone to get dentures to stay in place.  Pt states too invasive for her Eating mainly soft foods; Ensure supplement as well   "Breathing is not great" Still smoking but really wants to quit.  Would like to try Chantix. Using Bevespi and albuterol inhalers Takes a half a tablet of the night getting occasionally if she has any pains.  Takes a Xanax 0.5 mg at bedtime which helps with sleep and decrease his anxiety for Patient with history of hepatitis C induced cirrhosis.  Order for hepatitis C.  Not interested in pursuing ultrasound for [facility] screening at  this time. I did her about the lung nodules seen on CAT scan of the chest last year.  I will try to get her in with pulmonary.  Not interested in follow-up CT at this time. Patient Active Problem List   Diagnosis Date Noted   Hyperlipidemia 03/26/2022   Multiple lung nodules on CT 02/06/2022   Infection due to parainfluenza virus 4 02/06/2022   Acute ischemic stroke (HCC) 02/01/2022   Pressure injury of skin 01/31/2022   Pneumonia 01/30/2022   Protein-calorie malnutrition, severe 09/13/2020   Hypotension 09/12/2020   Tobacco dependence 08/11/2020   Malignant neoplasm of supraglottis (HCC) 07/13/2020   Herpes zoster without complication 03/24/2020   GOLD COPD III B 04/23/2018   Pruritic dermatitis 04/23/2018   High cholesterol 06/24/2017   Shortness of breath 06/04/2017   Coronary artery calcification 06/04/2017   Other fatigue 06/04/2017   Centrilobular emphysema (HCC) 05/15/2017   Atherosclerosis of arteries 05/15/2017   Poor memory 05/15/2017   Cigarette nicotine dependence without complication 05/15/2017   Lung nodules 04/16/2017   TB lung, latent 01/22/2017   Other cirrhosis of liver (HCC) 01/22/2017   Long-term use of high-risk medication 12/31/2016   Rheumatoid arthritis involving multiple sites with positive rheumatoid factor (HCC) 12/31/2016   Diabetes mellitus screening 11/19/2016   Fibromyalgia 05/15/2015   Rheumatoid arthritis (HCC) 08/22/2014   HTN (hypertension) 08/22/2014   Tobacco abuse 08/22/2014   Hepatic cirrhosis (HCC) 05/31/2014   Chronic hepatitis C virus infection (HCC) 05/03/2014     Current Outpatient Medications on File Prior to Visit  Medication Sig Dispense  Refill   albuterol (PROVENTIL) (2.5 MG/3ML) 0.083% nebulizer solution Take 3 mLs (2.5 mg total) by nebulization every 6 (six) hours as needed for wheezing or shortness of breath. 150 mL 1   albuterol (VENTOLIN HFA) 108 (90 Base) MCG/ACT inhaler INHALE 2 PUFFS INTO THE LUNGS EVERY 6 HOURS AS  NEEDED FOR WHEEZING OR SHORTNESS OF BREATH (Patient taking differently: Inhale 2 puffs into the lungs every 6 (six) hours as needed for wheezing or shortness of breath.) 8.5 g 3   ALPRAZolam (XANAX) 0.25 MG tablet Take 1 tablet (0.25 mg total) by mouth 2 (two) times daily as needed for anxiety. 30 tablet 0   aspirin 81 MG chewable tablet Chew 1 tablet (81 mg total) by mouth daily. 30 tablet 0   atorvastatin (LIPITOR) 40 MG tablet Take 1 tablet (40 mg total) by mouth at bedtime. 30 tablet 0   cyclobenzaprine (FLEXERIL) 5 MG tablet Take 5 mg by mouth 3 (three) times daily as needed for muscle spasms.     feeding supplement (ENSURE ENLIVE / ENSURE PLUS) LIQD Take 237 mLs by mouth 5 (five) times daily.     Glycopyrrolate-Formoterol (BEVESPI AEROSPHERE) 9-4.8 MCG/ACT AERO Inhale 2 puffs into the lungs 2 (two) times daily. 10.7 g 5   HYDROcodone-acetaminophen (NORCO/VICODIN) 5-325 MG tablet Take 1 tablet by mouth every 6 (six) hours as needed for moderate pain.     lidocaine (XYLOCAINE) 2 % solution Use as directed 15 mLs in the mouth or throat in the morning, at noon, in the evening, and at bedtime. Patient: Mix 1part 2% viscous lidocaine, 1part H20. Swallow 10mL of diluted mixture, before meals and at bedtime, up to QID     No current facility-administered medications on file prior to visit.    Allergies  Allergen Reactions   Tudorza Pressair [Aclidinium Bromide] Itching   Amitriptyline Other (See Comments)    Eye spasms per pt   Codeine Nausea Only   Tramadol Nausea Only    Social History   Socioeconomic History   Marital status: Married    Spouse name: Not on file   Number of children: Not on file   Years of education: Not on file   Highest education level: Not on file  Occupational History   Not on file  Tobacco Use   Smoking status: Every Day    Packs/day: 1.00    Years: 51.00    Additional pack years: 0.00    Total pack years: 51.00    Types: Cigarettes    Start date:  45   Smokeless tobacco: Never   Tobacco comments:    4 cigs a day   Vaping Use   Vaping Use: Never used  Substance and Sexual Activity   Alcohol use: No    Alcohol/week: 0.0 standard drinks of alcohol   Drug use: No   Sexual activity: Not Currently  Other Topics Concern   Not on file  Social History Narrative   Not on file   Social Determinants of Health   Financial Resource Strain: Not on file  Food Insecurity: No Food Insecurity (07/11/2020)   Hunger Vital Sign    Worried About Running Out of Food in the Last Year: Never true    Ran Out of Food in the Last Year: Never true  Transportation Needs: No Transportation Needs (07/11/2020)   PRAPARE - Administrator, Civil Service (Medical): No    Lack of Transportation (Non-Medical): No  Physical Activity: Not on file  Stress: Not on file  Social Connections: Not on file  Intimate Partner Violence: Not on file    Family History  Problem Relation Age of Onset   Heart disease Mother    Cancer Mother    Melanoma Mother    Esophageal cancer Father    Colon cancer Neg Hx    Colon polyps Neg Hx    Rectal cancer Neg Hx    Stomach cancer Neg Hx     Past Surgical History:  Procedure Laterality Date   ABDOMINAL HYSTERECTOMY     CATARACT EXTRACTION, BILATERAL     COLONOSCOPY     COLONOSCOPY  04/04/2020   DIRECT LARYNGOSCOPY N/A 06/26/2020   Procedure: DIRECT LARYNGOSCOPY AND BIOPSY;  Surgeon: Drema Halon, MD;  Location: [facility] OR;  Service: ENT;  Laterality: N/A;   IR GASTROSTOMY TUBE MOD SED  07/27/2020   IR GASTROSTOMY TUBE REMOVAL  04/17/2021   IR IMAGING GUIDED PORT INSERTION  07/27/2020   IR REMOVAL TUN ACCESS W/ PORT W/O FL MOD SED  03/20/2021   KNEE SURGERY Right    POLYPECTOMY  11/17/2008   HPP x 1     ROS: Review of Systems Negative except as stated above  PHYSICAL EXAM: BP 110/63 (BP Location: Left Arm, Patient Position: Sitting, Cuff Size: Normal)   Pulse 87   Temp 97.8 F (36.6 C)  (Oral)   Ht 5\' 6"  (1.676 m)   Wt 87 lb (39.5 kg)   SpO2 93%   BMI 14.04 kg/m   Wt Readings from Last 3 Encounters:  01/09/23 87 lb (39.5 kg)  09/12/22 93 lb (42.2 kg)  05/10/22 96 lb 12.8 oz (43.9 kg)  Pox is on RA  Physical Exam  General appearance -patient appears significantly underweight for height with wasting.  She has moderate hoarseness which is not new. Mental status -patient is a little forgetful at times but answers most questions appropriately. Chest -breath sounds significantly decreased but equal on both sides. Heart - normal rate, regular rhythm, normal S1, S2, no murmurs, rubs, clicks or gallops Extremities -no lower extremity edema.      Latest Ref Rng & Units 03/11/2022    2:39 PM 02/07/2022    6:12 AM 02/06/2022    5:11 AM  CMP  Glucose 70 - 99 mg/dL 76  92    BUN 8 - 27 mg/dL 8  29    Creatinine 1.61 - 1.00 mg/dL 0.96  0.45  4.09   Sodium 134 - 144 mmol/L 143  143    Potassium 3.5 - 5.2 mmol/L 4.1  4.2    Chloride 96 - 106 mmol/L 104  103    CO2 20 - 29 mmol/L 28  36    Calcium 8.7 - 10.3 mg/dL 9.4  9.0    Total Protein 6.0 - 8.5 g/dL 6.8  6.0    Total Bilirubin 0.0 - 1.2 mg/dL 0.3  0.4    Alkaline Phos 44 - 121 IU/L 60  48    AST 0 - 40 IU/L 28  31    ALT 0 - 32 IU/L 17  28     Lipid Panel     Component Value Date/Time   CHOL 144 02/01/2022 0510   CHOL 133 06/02/2020 0845   TRIG 65 02/01/2022 0510   HDL 40 (L) 02/01/2022 0510   HDL 67 06/02/2020 0845   CHOLHDL 3.6 02/01/2022 0510   VLDL 13 02/01/2022 0510   LDLCALC 91 02/01/2022 0510   LDLCALC  50 06/02/2020 0845    CBC    Component Value Date/Time   WBC 5.6 03/11/2022 1439   WBC 10.5 02/07/2022 0612   RBC 4.36 03/11/2022 1439   RBC 4.42 02/07/2022 0612   HGB 13.3 03/11/2022 1439   HCT 39.4 03/11/2022 1439   PLT 139 (L) 03/11/2022 1439   MCV 90 03/11/2022 1439   MCH 30.5 03/11/2022 1439   MCH 30.3 02/07/2022 0612   MCHC 33.8 03/11/2022 1439   MCHC 31.4 02/07/2022 0612   RDW 13.6  03/11/2022 1439   LYMPHSABS 0.6 (L) 02/02/2022 0640   LYMPHSABS 2.9 11/19/2016 1223   MONOABS 0.7 02/02/2022 0640   EOSABS 0.0 02/02/2022 0640   EOSABS 0.2 11/19/2016 1223   BASOSABS 0.0 02/02/2022 0640   BASOSABS 0.0 11/19/2016 1223    ASSESSMENT AND PLAN: 1. Severe protein-calorie malnutrition (HCC) Encouraged her to continue to try to eat as much as she can.  Continue Ensure shakes supplements. - CBC - Comprehensive metabolic panel  2. Hospice care patient   3. Other cirrhosis of liver (HCC) Previous cured from hep C.  Currently on hospice and does not wish to pursue ultrasound for [facility] surveillance.  4. Tobacco dependence Patient is wanting to quit and would like to try Chantix.  Prescription sent to her pharmacy for starter pack.  5. Centrilobular emphysema (HCC) Continue current inhaler as mentioned above. - albuterol (VENTOLIN HFA) 108 (90 Base) MCG/ACT inhaler; Inhale 2 puffs into the lungs every 6 (six) hours as needed for wheezing or shortness of breath.  Dispense: 8.5 g; Refill: 6  6. Multiple lung nodules on CT Patient wants to hold off on repeat scan or referral to pulmonary at this time.    Patient was given the opportunity to ask questions.  Patient verbalized understanding of the plan and was able to repeat key elements of the plan.   This documentation was completed using Paediatric nurse.  Any transcriptional errors are unintentional.  No orders of the defined types were placed in this encounter.    Requested Prescriptions    No prescriptions requested or ordered in this encounter    No follow-ups on file.  Jonah Blue, MD, FACP

## 2023-01-09 NOTE — Patient Instructions (Signed)
I will send a prescription to your pharmacy for the Chantix to help with smoking cessation.

## 2023-01-10 ENCOUNTER — Encounter: Payer: Self-pay | Admitting: Internal Medicine

## 2023-01-10 LAB — COMPREHENSIVE METABOLIC PANEL
ALT: 14 IU/L (ref 0–32)
AST: 32 IU/L (ref 0–40)
Albumin/Globulin Ratio: 1.5 (ref 1.2–2.2)
Albumin: 4.5 g/dL (ref 3.9–4.9)
Alkaline Phosphatase: 81 IU/L (ref 44–121)
BUN/Creatinine Ratio: 14 (ref 12–28)
BUN: 12 mg/dL (ref 8–27)
Bilirubin Total: 0.5 mg/dL (ref 0.0–1.2)
CO2: 26 mmol/L (ref 20–29)
Calcium: 10 mg/dL (ref 8.7–10.3)
Chloride: 98 mmol/L (ref 96–106)
Creatinine, Ser: 0.87 mg/dL (ref 0.57–1.00)
Globulin, Total: 3.1 g/dL (ref 1.5–4.5)
Glucose: 81 mg/dL (ref 70–99)
Potassium: 4.3 mmol/L (ref 3.5–5.2)
Sodium: 140 mmol/L (ref 134–144)
Total Protein: 7.6 g/dL (ref 6.0–8.5)
eGFR: 72 mL/min/{1.73_m2} (ref >59)

## 2023-01-10 LAB — CBC
Hematocrit: 50 % — ABNORMAL HIGH (ref 34.0–46.6)
Hemoglobin: 17 g/dL — ABNORMAL HIGH (ref 11.1–15.9)
MCH: 30.4 pg (ref 26.6–33.0)
MCHC: 34 g/dL (ref 31.5–35.7)
MCV: 89 fL (ref 79–97)
Platelets: 178 10*3/uL (ref 150–450)
RBC: 5.59 x10E6/uL — ABNORMAL HIGH (ref 3.77–5.28)
RDW: 13.2 % (ref 11.7–15.4)
WBC: 10.3 10*3/uL (ref 3.4–10.8)

## 2023-01-10 MED ORDER — VARENICLINE TARTRATE (STARTER) 0.5 MG X 11 & 1 MG X 42 PO TBPK: ORAL_TABLET | ORAL | 0 refills | Status: DC

## 2023-01-11 ENCOUNTER — Telehealth: Payer: Self-pay | Admitting: Internal Medicine

## 2023-01-11 NOTE — Telephone Encounter (Signed)
PC placed to pt yesterday evening around 6:20 p.m to go over lab results.  Pt informed that her RBC count with Hb are elev.  Likely due to intermittent hypoxia.  Pox on RA in the office was 93.  Pt does have supplement O2 at home through Hospice but states she has not been using it.  Advise to use it when she is ambulatory.  She expressed understanding.

## 2023-03-12 ENCOUNTER — Encounter: Payer: Self-pay | Admitting: Hematology and Oncology

## 2023-04-08 ENCOUNTER — Ambulatory Visit: Payer: 59 | Attending: Internal Medicine

## 2023-05-13 ENCOUNTER — Encounter: Payer: Self-pay | Admitting: Internal Medicine

## 2023-05-13 ENCOUNTER — Ambulatory Visit: Payer: 59 | Attending: Internal Medicine | Admitting: Internal Medicine

## 2023-05-13 VITALS — BP 125/66 | HR 87 | Temp 97.8°F | Ht 66.0 in | Wt 89.0 lb

## 2023-05-13 DIAGNOSIS — Z23 Encounter for immunization: Secondary | ICD-10-CM

## 2023-05-13 DIAGNOSIS — J432 Centrilobular emphysema: Secondary | ICD-10-CM

## 2023-05-13 DIAGNOSIS — F17210 Nicotine dependence, cigarettes, uncomplicated: Secondary | ICD-10-CM | POA: Diagnosis not present

## 2023-05-13 DIAGNOSIS — F17200 Nicotine dependence, unspecified, uncomplicated: Secondary | ICD-10-CM

## 2023-05-13 DIAGNOSIS — E43 Unspecified severe protein-calorie malnutrition: Secondary | ICD-10-CM

## 2023-05-13 DIAGNOSIS — R918 Other nonspecific abnormal finding of lung field: Secondary | ICD-10-CM

## 2023-05-13 DIAGNOSIS — R221 Localized swelling, mass and lump, neck: Secondary | ICD-10-CM

## 2023-05-13 DIAGNOSIS — L989 Disorder of the skin and subcutaneous tissue, unspecified: Secondary | ICD-10-CM

## 2023-05-13 NOTE — Patient Instructions (Signed)
We have referred you to a dermatologist for the mole on your right cheek.  We have ordered a CAT scan of your neck  to evaluate the knot felt on the right side of your neck.  We have ordered a CAT scan of your chest to follow up on the lung nodules.

## 2023-05-13 NOTE — Progress Notes (Unsigned)
Patient ID: Raven Torres, female    DOB: [birth year]  MRN: [id number]  CC: Follow-up (Malnutrition f/u. Hinda Glatter on R side of throat - previously had cancer there./Mole on R cheek /Yes to shingles vax. Yes to flu vax.)   Subjective: Raven Torres is a 67 y.o. female who presents for chronic ds management. Her concerns today include:  Patient with history of supraglottic laryngeal CA (status post chemoradiation with end of treatment PET/CT with complete response), HTN, cured hepatitis C with cirrhosis, adjustment disorder, HL, tob dep, and rheumatoid arthritis/fibromyalgia (Dr. Allena Katz at [facility]) , memory decline (MMSE 05/2019 was 30/30), COPD GOLD III, lung nodules (Dr. 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  They come out every Thur Noticed knot on RT anterolateral neck x 3 wks.  A little sore. Concern that she may have recurence of CA.  Would like to have it checked out. Would want to be treated if it is a recurrence. Remains underwgh/malnourish.  Has to be careful when swallowing to prevent aspiration.  Not able to wear her lower dentures and would need surgery to lower jaw to get them to fit properly.  Pt not interested in that.  Tries to eat as much as she can; gets tired of eating soft foods and has to take small bites.  Still drinking about 4 cans of Ensure a day to supplement what she eats. Recently started on Trazodone by hospice to help her sleep.  Also takes Xanax 0.25 mg at nights to decrease anxiety. Takes 1/2 pain pill of Norco if she gets hurting in neck.  Does not like to take it.   Now living alone in elderly assisted living. Poor memory since have CVA last year.  COPD/Tob/lung nodules: uses her neb 2 x/day  and Bevespi  inhaler Still smoking and trying to quit but finds it hard to do so; down to 1 pk a wk.  Does not recall if she picked up Chantix prescribe on last visit.   I discussed with her on last visit whether she wanted to have CAT scan done to follow-up on lung nodules.  She declined at that time.  However in light of the fact that we we will be doing CAT scan to evaluate neck mass, she is agreeable to having CAT scan of the chest done at the same time.  Has mole/sore on RT cheek x 1 yr.  No increase in size.  Has a scab and is irritating  HM:  yes to flu, PCV 23 and 2nd Shingles vaccine.  Does not wish to do any more MMG.     Patient Active Problem List   Diagnosis Date Noted   Hospice care patient 01/09/2023   Hyperlipidemia 03/26/2022   Multiple lung nodules on CT 02/06/2022   Infection due to parainfluenza virus 4 02/06/2022   Acute ischemic stroke (HCC) 02/01/2022   Pressure injury of skin 01/31/2022   Pneumonia 01/30/2022   Protein-calorie malnutrition, severe 09/13/2020   Hypotension 09/12/2020   Tobacco dependence 08/11/2020   Malignant neoplasm of supraglottis (HCC) 07/13/2020   Herpes zoster without complication 03/24/2020   GOLD COPD III B 04/23/2018   Pruritic dermatitis 04/23/2018   High cholesterol 06/24/2017   Shortness of breath 06/04/2017   Coronary artery calcification 06/04/2017   Other fatigue 06/04/2017  Centrilobular emphysema (HCC) 05/15/2017   Atherosclerosis of arteries 05/15/2017   Poor memory 05/15/2017   Cigarette nicotine dependence without complication 05/15/2017   Lung nodules 04/16/2017   TB lung, latent 01/22/2017   Other cirrhosis of liver (HCC) 01/22/2017   Long-term use of high-risk medication 12/31/2016   Rheumatoid arthritis involving multiple sites with positive rheumatoid factor (HCC) 12/31/2016   Diabetes mellitus screening 11/19/2016   Fibromyalgia 05/15/2015   Rheumatoid arthritis (HCC) 08/22/2014   HTN (hypertension) 08/22/2014   Tobacco  abuse 08/22/2014   Hepatic cirrhosis (HCC) 05/31/2014   Chronic hepatitis C virus infection (HCC) 05/03/2014     Current Outpatient Medications on File Prior to Visit  Medication Sig Dispense Refill   albuterol (PROVENTIL) (2.5 MG/3ML) 0.083% nebulizer solution Take 3 mLs (2.5 mg total) by nebulization every 6 (six) hours as needed for wheezing or shortness of breath. 150 mL 1   albuterol (VENTOLIN HFA) 108 (90 Base) MCG/ACT inhaler Inhale 2 puffs into the lungs every 6 (six) hours as needed for wheezing or shortness of breath. 8.5 g 6   ALPRAZolam (XANAX) 0.25 MG tablet Take 1 tablet (0.25 mg total) by mouth 2 (two) times daily as needed for anxiety. 30 tablet 0   aspirin 81 MG chewable tablet Chew 1 tablet (81 mg total) by mouth daily. 30 tablet 0   atorvastatin (LIPITOR) 40 MG tablet Take 1 tablet (40 mg total) by mouth at bedtime. 30 tablet 0   cyclobenzaprine (FLEXERIL) 5 MG tablet Take 5 mg by mouth 3 (three) times daily as needed for muscle spasms.     feeding supplement (ENSURE ENLIVE / ENSURE PLUS) LIQD Take 237 mLs by mouth 5 (five) times daily.     Glycopyrrolate-Formoterol (BEVESPI AEROSPHERE) 9-4.8 MCG/ACT AERO Inhale 2 puffs into the lungs 2 (two) times daily. 10.7 g 5   HYDROcodone-acetaminophen (NORCO/VICODIN) 5-325 MG tablet Take 1 tablet by mouth every 6 (six) hours as needed for moderate pain.     lidocaine (XYLOCAINE) 2 % solution Use as directed 15 mLs in the mouth or throat in the morning, at noon, in the evening, and at bedtime. Patient: Mix 1part 2% viscous lidocaine, 1part H20. Swallow 10mL of diluted mixture, before meals and at bedtime, up to QID     Varenicline Tartrate, Starter, (CHANTIX STARTING MONTH PAK) 0.5 MG X 11 & 1 MG X 42 TBPK PO 0.5 mg daily for day 1-3 then BID for days 4-7 then 1 mg BID 1 each 0   No current facility-administered medications on file prior to visit.    Allergies  Allergen Reactions   Tudorza Pressair [Aclidinium Bromide] Itching    Amitriptyline Other (See Comments)    Eye spasms per pt   Codeine Nausea Only   Tramadol Nausea Only    Social History   Socioeconomic History   Marital status: Married    Spouse name: Not on file   Number of children: Not on file   Years of education: Not on file   Highest education level: Not on file  Occupational History   Not on file  Tobacco Use   Smoking status: Every Day    Current packs/day: 1.00    Average packs/day: 1 pack/day for 53.8 years (53.8 ttl pk-yrs)    Types: Cigarettes    Start date: 77   Smokeless tobacco: Never   Tobacco comments:    4 cigs a day   Vaping Use   Vaping status: Never Used  Substance and Sexual  Activity   Alcohol use: No    Alcohol/week: 0.0 standard drinks of alcohol   Drug use: No   Sexual activity: Not Currently  Other Topics Concern   Not on file  Social History Narrative   Not on file   Social Determinants of Health   Financial Resource Strain: Low Risk  (05/13/2023)   Overall Financial Resource Strain (CARDIA)    Difficulty of Paying Living Expenses: Not hard at all  Food Insecurity: No Food Insecurity (05/13/2023)   Hunger Vital Sign    Worried About Running Out of Food in the Last Year: Never true    Ran Out of Food in the Last Year: Never true  Transportation Needs: No Transportation Needs (05/13/2023)   PRAPARE - Administrator, Civil Service (Medical): No    Lack of Transportation (Non-Medical): No  Physical Activity: Inactive (05/13/2023)   Exercise Vital Sign    Days of Exercise per Week: 0 days    Minutes of Exercise per Session: 0 min  Stress: Stress Concern Present (05/13/2023)   [employer] of Occupational Health - Occupational Stress Questionnaire    Feeling of Stress : Rather much  Social Connections: Moderately Isolated (05/13/2023)   Social Connection and Isolation Panel [NHANES]    Frequency of Communication with Friends and Family: More than three times a week    Frequency of Social  Gatherings with Friends and Family: More than three times a week    Attends Religious Services: More than 4 times per year    Active Member of [employer] or Organizations: No    Attends Banker Meetings: Never    Marital Status: Widowed  Intimate Partner Violence: Not At Risk (05/13/2023)   Humiliation, Afraid, Rape, and Kick questionnaire    Fear of Current or Ex-Partner: No    Emotionally Abused: No    Physically Abused: No    Sexually Abused: No    Family History  Problem Relation Age of Onset   Heart disease Mother    Cancer Mother    Melanoma Mother    Esophageal cancer Father    Colon cancer Neg Hx    Colon polyps Neg Hx    Rectal cancer Neg Hx    Stomach cancer Neg Hx     Past Surgical History:  Procedure Laterality Date   ABDOMINAL HYSTERECTOMY     CATARACT EXTRACTION, BILATERAL     COLONOSCOPY     COLONOSCOPY  04/04/2020   DIRECT LARYNGOSCOPY N/A 06/26/2020   Procedure: DIRECT LARYNGOSCOPY AND BIOPSY;  Surgeon: Drema Halon, MD;  Location: [facility] OR;  Service: ENT;  Laterality: N/A;   IR GASTROSTOMY TUBE MOD SED  07/27/2020   IR GASTROSTOMY TUBE REMOVAL  04/17/2021   IR IMAGING GUIDED PORT INSERTION  07/27/2020   IR REMOVAL TUN ACCESS W/ PORT W/O FL MOD SED  03/20/2021   KNEE SURGERY Right    POLYPECTOMY  11/17/2008   HPP x 1     ROS: Review of Systems Negative except as stated above  PHYSICAL EXAM: BP 125/66 (BP Location: Left Arm, Patient Position: Sitting, Cuff Size: Small)   Pulse 87   Temp 97.8 F (36.6 C) (Oral)   Ht 5\' 6"  (1.676 m)   Wt 89 lb (40.4 kg)   SpO2 94%   BMI 14.36 kg/m   Wt Readings from Last 3 Encounters:  05/13/23 89 lb (40.4 kg)  01/09/23 87 lb (39.5 kg)  09/12/22 93 lb (42.2 kg)  Physical Exam  General appearance - pt chronically ill and gaunt appearing Mental status - oriented to person and place.  Expresses decrease memory since CVA Mouth - slightly dry oral mucosa Neck - no LN.  Nodular lesion about size  of blueberry felt about midway down RT side of trachea Chest -breath sounds decreased.  No wheezes or crackles heard Heart -regular rate and rhythm. Extremities -no edema.  Significant loss of muscle mass. Skin: Small raised scabbed over lesion noted on right cheek.  See picture below.     Latest Ref Rng & Units 05/13/2023   11:37 AM 01/09/2023    1:54 PM 03/11/2022    2:39 PM  CMP  Glucose 70 - 99 mg/dL 95  81  76   BUN 8 - 27 mg/dL 13  12  8    Creatinine 0.57 - 1.00 mg/dL 3.22  0.25  4.27   Sodium 134 - 144 mmol/L 144  140  143   Potassium 3.5 - 5.2 mmol/L 5.0  4.3  4.1   Chloride 96 - 106 mmol/L 100  98  104   CO2 20 - 29 mmol/L 30  26  28    Calcium 8.7 - 10.3 mg/dL 06.2  37.6  9.4   Total Protein 6.0 - 8.5 g/dL  7.6  6.8   Total Bilirubin 0.0 - 1.2 mg/dL  0.5  0.3   Alkaline Phos 44 - 121 IU/L  81  60   AST 0 - 40 IU/L  32  28   ALT 0 - 32 IU/L  14  17    Lipid Panel     Component Value Date/Time   CHOL 144 02/01/2022 0510   CHOL 133 06/02/2020 0845   TRIG 65 02/01/2022 0510   HDL 40 (L) 02/01/2022 0510   HDL 67 06/02/2020 0845   CHOLHDL 3.6 02/01/2022 0510   VLDL 13 02/01/2022 0510   LDLCALC 91 02/01/2022 0510   LDLCALC 50 06/02/2020 0845    CBC    Component Value Date/Time   WBC 10.3 01/09/2023 1354   WBC 10.5 02/07/2022 0612   RBC 5.59 (H) 01/09/2023 1354   RBC 4.42 02/07/2022 0612   HGB 17.0 (H) 01/09/2023 1354   HCT 50.0 (H) 01/09/2023 1354   PLT 178 01/09/2023 1354   MCV 89 01/09/2023 1354   MCH 30.4 01/09/2023 1354   MCH 30.3 02/07/2022 0612   MCHC 34.0 01/09/2023 1354   MCHC 31.4 02/07/2022 0612   RDW 13.2 01/09/2023 1354   LYMPHSABS 0.6 (L) 02/02/2022 0640   LYMPHSABS 2.9 11/19/2016 1223   MONOABS 0.7 02/02/2022 0640   EOSABS 0.0 02/02/2022 0640   EOSABS 0.2 11/19/2016 1223   BASOSABS 0.0 02/02/2022 0640   BASOSABS 0.0 11/19/2016 1223    ASSESSMENT AND PLAN: 1. Neck mass Patient with history of supraglottic laryngeal CA diagnosed in 2021 and  has undergone treatment.  Now has new small nodular mass palpated on the right side of the trachea.  Patient currently on hospice.  Discussed with her today about whether she would want to pursue this in light of the fact that she is on hospice.  Also if this is recurrence, would she want to pursue additional treatment and question of whether she would be eligible for additional treatment given significant malnutrition.  Patient indicates that she wants to know whether it is recurrence and would want to be treated if it is.  I have submitted order for CAT scan of the neck. - CT  SOFT TISSUE NECK W CONTRAST; Future  2. Severe protein-calorie malnutrition (HCC) Patient with failure to gain weight since being diagnosed and treated for laryngeal CA.  On hospice.  Not interested in considering PEG tube - Basic Metabolic Panel  3. Tobacco dependence Strongly advised her to quit.  4. Centrilobular emphysema (HCC) Continue nebulizer treatments and Bevespi inhaler  5. Lesion of face She would like to have this checked out by the dermatologist.  Referral submitted - Ambulatory referral to Dermatology  6. Lung nodules - CT Chest Wo Contrast; Future  7. Encounter for immunization - Flu Vaccine Trivalent High Dose (Fluad)  8. Need for vaccination against Streptococcus pneumoniae   Patient was given the opportunity to ask questions.  Patient verbalized understanding of the plan and was able to repeat key elements of the plan.   This documentation was completed using Paediatric nurse.  Any transcriptional errors are unintentional.  Orders Placed This Encounter  Procedures   CT SOFT TISSUE NECK W CONTRAST   CT Chest Wo Contrast   Flu Vaccine Trivalent High Dose (Fluad)   Pneumococcal polysaccharide vaccine 23-valent greater than or equal to 2yo subcutaneous/IM   Basic Metabolic Panel   Ambulatory referral to Dermatology     Requested Prescriptions    No prescriptions  requested or ordered in this encounter    Return in about 4 months (around 09/13/2023).  Jonah Blue, MD, FACP

## 2023-05-14 LAB — BASIC METABOLIC PANEL
BUN/Creatinine Ratio: 18 (ref 12–28)
BUN: 13 mg/dL (ref 8–27)
CO2: 30 mmol/L — ABNORMAL HIGH (ref 20–29)
Calcium: 10.1 mg/dL (ref 8.7–10.3)
Chloride: 100 mmol/L (ref 96–106)
Creatinine, Ser: 0.74 mg/dL (ref 0.57–1.00)
Glucose: 95 mg/dL (ref 70–99)
Potassium: 5 mmol/L (ref 3.5–5.2)
Sodium: 144 mmol/L (ref 134–144)
eGFR: 88 mL/min/{1.73_m2} (ref >59)

## 2023-06-17 ENCOUNTER — Ambulatory Visit: Payer: 59 | Attending: Internal Medicine

## 2023-08-21 ENCOUNTER — Emergency Department (HOSPITAL_COMMUNITY): Payer: 59

## 2023-08-21 ENCOUNTER — Encounter (HOSPITAL_COMMUNITY): Payer: Self-pay | Admitting: Emergency Medicine

## 2023-08-21 ENCOUNTER — Encounter: Payer: Self-pay | Admitting: Hematology and Oncology

## 2023-08-21 ENCOUNTER — Inpatient Hospital Stay (HOSPITAL_COMMUNITY)
Admission: EM | Admit: 2023-08-21 | Discharge: 2023-08-22 | DRG: 871 | Disposition: A | Discharge status: Hospice | Payer: 59 | Attending: Student | Admitting: Student

## 2023-08-21 DIAGNOSIS — C321 Malignant neoplasm of supraglottis: Secondary | ICD-10-CM | POA: Diagnosis present

## 2023-08-21 DIAGNOSIS — J432 Centrilobular emphysema: Secondary | ICD-10-CM | POA: Diagnosis present

## 2023-08-21 DIAGNOSIS — E785 Hyperlipidemia, unspecified: Secondary | ICD-10-CM | POA: Diagnosis present

## 2023-08-21 DIAGNOSIS — Z885 Allergy status to narcotic agent status: Secondary | ICD-10-CM

## 2023-08-21 DIAGNOSIS — Z9841 Cataract extraction status, right eye: Secondary | ICD-10-CM

## 2023-08-21 DIAGNOSIS — J441 Chronic obstructive pulmonary disease with (acute) exacerbation: Secondary | ICD-10-CM | POA: Diagnosis present

## 2023-08-21 DIAGNOSIS — F17210 Nicotine dependence, cigarettes, uncomplicated: Secondary | ICD-10-CM | POA: Diagnosis present

## 2023-08-21 DIAGNOSIS — Z79899 Other long term (current) drug therapy: Secondary | ICD-10-CM

## 2023-08-21 DIAGNOSIS — Z8249 Family history of ischemic heart disease and other diseases of the circulatory system: Secondary | ICD-10-CM

## 2023-08-21 DIAGNOSIS — Z66 Do not resuscitate: Secondary | ICD-10-CM | POA: Diagnosis present

## 2023-08-21 DIAGNOSIS — Z515 Encounter for palliative care: Secondary | ICD-10-CM

## 2023-08-21 DIAGNOSIS — J9621 Acute and chronic respiratory failure with hypoxia: Secondary | ICD-10-CM | POA: Diagnosis not present

## 2023-08-21 DIAGNOSIS — J189 Pneumonia, unspecified organism: Principal | ICD-10-CM | POA: Diagnosis present

## 2023-08-21 DIAGNOSIS — E43 Unspecified severe protein-calorie malnutrition: Secondary | ICD-10-CM | POA: Diagnosis present

## 2023-08-21 DIAGNOSIS — R918 Other nonspecific abnormal finding of lung field: Secondary | ICD-10-CM | POA: Diagnosis present

## 2023-08-21 DIAGNOSIS — Z888 Allergy status to other drugs, medicaments and biological substances status: Secondary | ICD-10-CM

## 2023-08-21 DIAGNOSIS — Z1152 Encounter for screening for COVID-19: Secondary | ICD-10-CM

## 2023-08-21 DIAGNOSIS — Z9842 Cataract extraction status, left eye: Secondary | ICD-10-CM

## 2023-08-21 DIAGNOSIS — Z7982 Long term (current) use of aspirin: Secondary | ICD-10-CM

## 2023-08-21 DIAGNOSIS — A419 Sepsis, unspecified organism: Secondary | ICD-10-CM | POA: Diagnosis not present

## 2023-08-21 DIAGNOSIS — J440 Chronic obstructive pulmonary disease with acute lower respiratory infection: Secondary | ICD-10-CM | POA: Diagnosis present

## 2023-08-21 DIAGNOSIS — I10 Essential (primary) hypertension: Secondary | ICD-10-CM | POA: Diagnosis present

## 2023-08-21 DIAGNOSIS — J9622 Acute and chronic respiratory failure with hypercapnia: Secondary | ICD-10-CM | POA: Diagnosis present

## 2023-08-21 DIAGNOSIS — J9601 Acute respiratory failure with hypoxia: Principal | ICD-10-CM

## 2023-08-21 LAB — I-STAT VENOUS BLOOD GAS, ED
Acid-Base Excess: 8 mmol/L — ABNORMAL HIGH (ref 0.0–2.0)
Bicarbonate: 39.9 mmol/L — ABNORMAL HIGH (ref 20.0–28.0)
Calcium, Ion: 1.2 mmol/L (ref 1.15–1.40)
HCT: 50 % — ABNORMAL HIGH (ref 36.0–46.0)
Hemoglobin: 17 g/dL — ABNORMAL HIGH (ref 12.0–15.0)
O2 Saturation: 64 %
Potassium: 4.1 mmol/L (ref 3.5–5.1)
Sodium: 138 mmol/L (ref 135–145)
TCO2: 43 mmol/L — ABNORMAL HIGH (ref 22–32)
pCO2, Ven: 87.4 mm[Hg] (ref 44–60)
pH, Ven: 7.267 (ref 7.25–7.43)
pO2, Ven: 40 mm[Hg] (ref 32–45)

## 2023-08-21 LAB — COMPREHENSIVE METABOLIC PANEL
ALT: 19 U/L (ref 0–44)
AST: 37 U/L (ref 15–41)
Albumin: 3.7 g/dL (ref 3.5–5.0)
Alkaline Phosphatase: 58 U/L (ref 38–126)
Anion gap: 9 (ref 5–15)
BUN: 14 mg/dL (ref 8–23)
CO2: 31 mmol/L (ref 22–32)
Calcium: 9.1 mg/dL (ref 8.9–10.3)
Chloride: 97 mmol/L — ABNORMAL LOW (ref 98–111)
Creatinine, Ser: 0.75 mg/dL (ref 0.44–1.00)
GFR, Estimated: >60 mL/min (ref >60)
Glucose, Bld: 116 mg/dL — ABNORMAL HIGH (ref 70–99)
Potassium: 4.1 mmol/L (ref 3.5–5.1)
Sodium: 137 mmol/L (ref 135–145)
Total Bilirubin: 0.6 mg/dL (ref 0.0–1.2)
Total Protein: 7.6 g/dL (ref 6.5–8.1)

## 2023-08-21 LAB — CBC
HCT: 49.8 % — ABNORMAL HIGH (ref 36.0–46.0)
Hemoglobin: 16.1 g/dL — ABNORMAL HIGH (ref 12.0–15.0)
MCH: 30.5 pg (ref 26.0–34.0)
MCHC: 32.3 g/dL (ref 30.0–36.0)
MCV: 94.3 fL (ref 80.0–100.0)
Platelets: 186 10*3/uL (ref 150–400)
RBC: 5.28 MIL/uL — ABNORMAL HIGH (ref 3.87–5.11)
RDW: 13.7 % (ref 11.5–15.5)
WBC: 12.5 10*3/uL — ABNORMAL HIGH (ref 4.0–10.5)
nRBC: 0 % (ref 0.0–0.2)

## 2023-08-21 LAB — TROPONIN I (HIGH SENSITIVITY)
Troponin I (High Sensitivity): 15 ng/L (ref <18)
Troponin I (High Sensitivity): 20 ng/L — ABNORMAL HIGH (ref <18)

## 2023-08-21 LAB — RESP PANEL BY RT-PCR (RSV, FLU A&B, COVID)  RVPGX2
Influenza A by PCR: NEGATIVE
Influenza B by PCR: NEGATIVE
Resp Syncytial Virus by PCR: NEGATIVE
SARS Coronavirus 2 by RT PCR: NEGATIVE

## 2023-08-21 LAB — BRAIN NATRIURETIC PEPTIDE: B Natriuretic Peptide: 235.4 pg/mL — ABNORMAL HIGH (ref 0.0–100.0)

## 2023-08-21 MED ORDER — SODIUM CHLORIDE 0.9 % IV SOLN
1.0000 g | Freq: Once | INTRAVENOUS | Status: AC
  Administered 2023-08-21: 1 g via INTRAVENOUS
  Filled 2023-08-21: qty 10

## 2023-08-21 MED ORDER — ALBUTEROL SULFATE (2.5 MG/3ML) 0.083% IN NEBU
2.5000 mg | INHALATION_SOLUTION | Freq: Once | RESPIRATORY_TRACT | Status: AC
Start: 2023-08-21 — End: 2023-08-21
  Administered 2023-08-21: 2.5 mg via RESPIRATORY_TRACT
  Filled 2023-08-21: qty 3

## 2023-08-21 MED ORDER — SODIUM CHLORIDE 0.9 % IV SOLN
500.0000 mg | Freq: Once | INTRAVENOUS | Status: AC
  Administered 2023-08-21: 500 mg via INTRAVENOUS
  Filled 2023-08-21: qty 5

## 2023-08-21 MED ORDER — SODIUM CHLORIDE 0.9 % IV BOLUS
500.0000 mL | Freq: Once | INTRAVENOUS | Status: AC
  Administered 2023-08-22: 500 mL via INTRAVENOUS

## 2023-08-21 MED ORDER — METHYLPREDNISOLONE SODIUM SUCC 125 MG IJ SOLR
125.0000 mg | Freq: Once | INTRAMUSCULAR | Status: AC
  Administered 2023-08-21: 125 mg via INTRAVENOUS
  Filled 2023-08-21: qty 2

## 2023-08-21 MED ORDER — SODIUM CHLORIDE 0.9 % IV SOLN: INTRAVENOUS | Status: DC

## 2023-08-21 NOTE — ED Triage Notes (Signed)
Pt BIB by EMS for SOB x 1 hour. 70% upon EMS arrival to home, unable to tolerate BiPAP or NRB. Pt able to speak in short sentences and hold a mask over her face. Pt states she wears oxygen as needed. Lung sounds diminished, rales noted upon auscultation.

## 2023-08-21 NOTE — ED Notes (Signed)
RT consulted

## 2023-08-21 NOTE — ED Notes (Signed)
RT at bedside with EDP

## 2023-08-21 NOTE — ED Notes (Signed)
Pt is currently on bipap, very anxious; does not like wearing mask. visitor at bedside. Encouragement and support provided. EDP Zackowski aware.

## 2023-08-21 NOTE — ED Notes (Signed)
EDP Zackowski notified of pt low bps.

## 2023-08-21 NOTE — ED Notes (Signed)
RT called to evaluate pt. Dr. Deretha Emory notified at bedside to assess.

## 2023-08-21 NOTE — ED Notes (Signed)
Hospitalist at bedside 

## 2023-08-21 NOTE — ED Notes (Signed)
PT continues to try and remove the bi-pap. EDP at bedside explaining the importance of leaving it on. Pt has agreed to try harder to leave it on.

## 2023-08-21 NOTE — ED Provider Notes (Addendum)
[place] EMERGENCY DEPARTMENT AT [facility] Provider Note   CSN: [id number] Arrival date & time: 08/21/23  1737     History  Chief Complaint  Patient presents with   Respiratory Distress    Raven Torres is a 68 y.o. female.  Patient brought in by EMS from home patient states that she been short of breath for an hour.  Patient oxygen sats were 70% upon EMS arrival.  Patient was attempted to be started on their CPAP but patient did not tolerated arrived in 100% nonrebreather.  I wanted to see patient she was on 4 L of oxygen sats were about 89% heart rate 104 temp 97.4 respirations 28.  Patient not moving air well at all difficulty with speaking.  Patient is a DNR paperwork with her.  Patient's past medical history significant for tuberculosis COPD hypertension hyperlipidemia congestive heart failure.  Past surgical history significant for abdominal hysterectomy patient still an everyday smoker a pack a day.       Home Medications Prior to Admission medications   Medication Sig Start Date End Date Taking? Authorizing Provider  albuterol (PROVENTIL) (2.5 MG/3ML) 0.083% nebulizer solution Take 3 mLs (2.5 mg total) by nebulization every 6 (six) hours as needed for wheezing or shortness of breath. 05/11/20   Marcine Matar, MD  albuterol (VENTOLIN HFA) 108 (90 Base) MCG/ACT inhaler Inhale 2 puffs into the lungs every 6 (six) hours as needed for wheezing or shortness of breath. 01/09/23   Marcine Matar, MD  ALPRAZolam Prudy Feeler) 0.25 MG tablet Take 1 tablet (0.25 mg total) by mouth 2 (two) times daily as needed for anxiety. 03/11/22   Marcine Matar, MD  aspirin 81 MG chewable tablet Chew 1 tablet (81 mg total) by mouth daily. 02/08/22   Drema Dallas, MD  atorvastatin (LIPITOR) 40 MG tablet Take 1 tablet (40 mg total) by mouth at bedtime. 03/26/22   Marinus Maw, MD  cyclobenzaprine (FLEXERIL) 5 MG tablet Take 5 mg by mouth 3 (three) times daily as needed for muscle  spasms.    [provider]  feeding supplement (ENSURE ENLIVE / ENSURE PLUS) LIQD Take 237 mLs by mouth 5 (five) times daily. 02/05/22   Glade Lloyd, MD  Glycopyrrolate-Formoterol (BEVESPI AEROSPHERE) 9-4.8 MCG/ACT AERO Inhale 2 puffs into the lungs 2 (two) times daily. 01/09/22   Marcine Matar, MD  HYDROcodone-acetaminophen (NORCO/VICODIN) 5-325 MG tablet Take 1 tablet by mouth every 6 (six) hours as needed for moderate pain.    [provider]  lidocaine (XYLOCAINE) 2 % solution Use as directed 15 mLs in the mouth or throat in the morning, at noon, in the evening, and at bedtime. Patient: Mix 1part 2% viscous lidocaine, 1part H20. Swallow 10mL of diluted mixture, before meals and at bedtime, up to QID 02/05/22   Glade Lloyd, MD  Varenicline Tartrate, Starter, (CHANTIX STARTING MONTH PAK) 0.5 MG X 11 & 1 MG X 42 TBPK PO 0.5 mg daily for day 1-3 then BID for days 4-7 then 1 mg BID 01/10/23   Marcine Matar, MD      Allergies    Carlos American pressair [aclidinium bromide], Amitriptyline, Codeine, and Tramadol    Review of Systems   Review of Systems  Constitutional:  Negative for chills and fever.  HENT:  Negative for ear pain and sore throat.   Eyes:  Negative for pain and visual disturbance.  Respiratory:  Positive for shortness of breath. Negative for cough.  Cardiovascular:  Negative for chest pain, palpitations and leg swelling.  Gastrointestinal:  Negative for abdominal pain and vomiting.  Genitourinary:  Negative for dysuria and hematuria.  Musculoskeletal:  Negative for arthralgias and back pain.  Skin:  Negative for color change and rash.  Neurological:  Negative for seizures and syncope.  All other systems reviewed and are negative.   Physical Exam Updated Vital Signs BP 106/60   Pulse 95   Temp (!) 97.5 F (36.4 C) (Oral)   Resp (!) 25   SpO2 96%  Physical Exam Vitals and nursing note reviewed.  Constitutional:      General: She is not in acute  distress.    Appearance: She is well-developed. She is ill-appearing. She is not diaphoretic.  HENT:     Head: Normocephalic and atraumatic.  Eyes:     Extraocular Movements: Extraocular movements intact.     Conjunctiva/sclera: Conjunctivae normal.     Pupils: Pupils are equal, round, and reactive to light.  Cardiovascular:     Rate and Rhythm: Normal rate and regular rhythm.     Heart sounds: No murmur heard. Pulmonary:     Effort: Respiratory distress present.     Breath sounds: Normal breath sounds. No wheezing, rhonchi or rales.     Comments: Not moving air well at all bilaterally Abdominal:     Palpations: Abdomen is soft.     Tenderness: There is no abdominal tenderness.  Musculoskeletal:        General: No swelling.     Cervical back: Normal range of motion and neck supple.  Skin:    General: Skin is warm and dry.     Capillary Refill: Capillary refill takes less than 2 seconds.  Neurological:     General: No focal deficit present.     Mental Status: She is alert and oriented to person, place, and time.  Psychiatric:        Mood and Affect: Mood normal.     ED Results / Procedures / Treatments   Labs (all labs ordered are listed, but only abnormal results are displayed) Labs Reviewed  COMPREHENSIVE METABOLIC PANEL - Abnormal; Notable for the following components:      Result Value   Chloride 97 (*)    Glucose, Bld 116 (*)    All other components within normal limits  CBC - Abnormal; Notable for the following components:   WBC 12.5 (*)    RBC 5.28 (*)    Hemoglobin 16.1 (*)    HCT 49.8 (*)    All other components within normal limits  BRAIN NATRIURETIC PEPTIDE - Abnormal; Notable for the following components:   B Natriuretic Peptide 235.4 (*)    All other components within normal limits  I-STAT VENOUS BLOOD GAS, ED - Abnormal; Notable for the following components:   pCO2, Ven 87.4 (*)    Bicarbonate 39.9 (*)    TCO2 43 (*)    Acid-Base Excess 8.0 (*)     HCT 50.0 (*)    Hemoglobin 17.0 (*)    All other components within normal limits  RESP PANEL BY RT-PCR (RSV, FLU A&B, COVID)  RVPGX2  TROPONIN I (HIGH SENSITIVITY)  TROPONIN I (HIGH SENSITIVITY)    EKG EKG Interpretation Date/Time:  Thursday August 21 2023 18:52:56 EST Ventricular Rate:  110 PR Interval:  161 QRS Duration:  80 QT Interval:  361 QTC Calculation: 489 R Axis:   74  Text Interpretation: Sinus tachycardia Multiple premature complexes, vent & supraven  Biatrial enlargement RSR' in V1 or V2, right VCD or RVH ST elevation, consider anterior injury Borderline prolonged QT interval Artifact in lead(s) V3 V4 V5 V6 Confirmed by Vanetta Mulders [phone]) on 08/21/2023 7:38:32 PM  Radiology DG Chest Port 1 View Result Date: 08/21/2023 CLINICAL DATA:  Shortness of breath, respiratory distress EXAM: PORTABLE CHEST 1 VIEW COMPARISON:  CT chest dated 04/24/2022 FINDINGS: Mild patchy right lower lung and left lower lobe opacities, suspicious for pneumonia. No pleural effusion or pneumothorax. The heart is normal in size.  Thoracic aortic atherosclerosis. IMPRESSION: Bilateral lower lung pneumonia. Electronically Signed   By: Charline Bills M.D.   On: 08/21/2023 21:07    Procedures Procedures    Medications Ordered in ED Medications  0.9 %  sodium chloride infusion (has no administration in time range)  albuterol (PROVENTIL) (2.5 MG/3ML) 0.083% nebulizer solution 2.5 mg (2.5 mg Nebulization Given 08/21/23 2004)  methylPREDNISolone sodium succinate (SOLU-MEDROL) 125 mg/2 mL injection 125 mg (125 mg Intravenous Given 08/21/23 2030)    ED Course/ Medical Decision Making/ A&P                                 Medical Decision Making Amount and/or Complexity of Data Reviewed Labs: ordered. Radiology: ordered.  Risk Prescription drug management. Decision regarding hospitalization.   CRITICAL CARE Performed by: Vanetta Mulders Total critical care time: 45 minutes Critical care  time was exclusive of separately billable procedures and treating other patients. Critical care was necessary to treat or prevent imminent or life-threatening deterioration. Critical care was time spent personally by me on the following activities: development of treatment plan with patient and/or surrogate as well as nursing, discussions with consultants, evaluation of patient's response to treatment, examination of patient, obtaining history from patient or surrogate, ordering and performing treatments and interventions, ordering and review of laboratory studies, ordering and review of radiographic studies, pulse oximetry and re-evaluation of patient's condition.  Patient really needs BiPAP.  Had long discussion with her she is going to give it a try.  Concerned that she will tire out plus she is moving air very poorly.  Will start her on BiPAP.  Will give a nebulizer treatment.  Will get labs.  Will get venous blood gas.  As stated patient is a DNR as we do not have much choice but to try to push the BiPAP.  Intubation is not an option.   Patient refused the BiPAP with respiratory therapy she did try it said it was suffocating her.  I had a long conversation with her and her family member saying that she is got to do this that will make her better it will not hurt her.  She is now trying very hard with this.  Patient's venous blood gas pCO2 was 87 pH was 7.26 pO2 was 40.  Complete metabolic panel sodium 137 glucose 116 renal function normal LFTs normal white count 12.5 hemoglobin 16.9 platelets 187.  Patient's initial troponin was 15.  Delta pending.  CBC had a white count of 12.5 hemoglobin 16.1 platelets 186.  Respiratory panel negative.  BNP up to 235.  Chest x-ray bilateral lower lung pneumonia.  That would explain a lot of her respiratory failure.  Based on this we will start antibiotics.  Discussed with hospitalist Lyda Perone who will admit.    Final Clinical Impression(s) / ED  Diagnoses Final diagnoses:  Acute respiratory failure with hypoxia (HCC)  COPD exacerbation (  [facility])  Multifocal pneumonia    Rx / DC Orders ED Discharge Orders     None         Vanetta Mulders, MD 08/21/23 1813    Vanetta Mulders, MD 08/21/23 Lauretta Chester    Vanetta Mulders, MD 08/21/23 2119    Vanetta Mulders, MD 08/21/23 2224

## 2023-08-21 NOTE — Progress Notes (Signed)
   08/21/23 2000  Vent Select  Invasive or Noninvasive Noninvasive  Adult Vent Y  Adult Ventilator Settings  Vent Type Servo i  Vent Mode PCV;BIPAP  FiO2 (%) 60 %  IPAP 15 cmH20  PEEP 5 cmH20  Adult Ventilator Measurements  Peak Airway Pressure 14 L/min  Mean Airway Pressure 8 cmH20  Resp Rate Spontaneous 15 br/min  Resp Rate Total 33 br/min  Exhaled Vt 510 mL  Measured Ve 13.5 L  Auto PEEP 0 cmH20  Total PEEP 5 cmH20  Adult Ventilator Alarms  Alarms On Y  Ve High Alarm 40 L/min  Ve Low Alarm 5 L/min  Resp Rate High Alarm 42 br/min  Resp Rate Low Alarm 8  PEEP Low Alarm 2 cmH2O  Press High Alarm 20 cmH2O  T Apnea 20 sec(s)  VAP Prevention  HOB> 30 Degrees Y   Breathing tx given pt wants to remove bipap changed pt to PCV/Bipap for more comfort.

## 2023-08-21 NOTE — ED Notes (Signed)
RT called for albuterol, pt on Bi-pap, CCMD called as well.

## 2023-08-22 ENCOUNTER — Encounter (HOSPITAL_COMMUNITY): Payer: Self-pay | Admitting: Internal Medicine

## 2023-08-22 DIAGNOSIS — Z79899 Other long term (current) drug therapy: Secondary | ICD-10-CM | POA: Diagnosis not present

## 2023-08-22 DIAGNOSIS — Z7982 Long term (current) use of aspirin: Secondary | ICD-10-CM | POA: Diagnosis not present

## 2023-08-22 DIAGNOSIS — C321 Malignant neoplasm of supraglottis: Secondary | ICD-10-CM | POA: Diagnosis not present

## 2023-08-22 DIAGNOSIS — Z66 Do not resuscitate: Secondary | ICD-10-CM | POA: Diagnosis present

## 2023-08-22 DIAGNOSIS — J189 Pneumonia, unspecified organism: Secondary | ICD-10-CM | POA: Diagnosis present

## 2023-08-22 DIAGNOSIS — Z515 Encounter for palliative care: Secondary | ICD-10-CM | POA: Diagnosis not present

## 2023-08-22 DIAGNOSIS — R918 Other nonspecific abnormal finding of lung field: Secondary | ICD-10-CM | POA: Diagnosis not present

## 2023-08-22 DIAGNOSIS — J9621 Acute and chronic respiratory failure with hypoxia: Secondary | ICD-10-CM | POA: Diagnosis present

## 2023-08-22 DIAGNOSIS — I10 Essential (primary) hypertension: Secondary | ICD-10-CM | POA: Diagnosis present

## 2023-08-22 DIAGNOSIS — J432 Centrilobular emphysema: Secondary | ICD-10-CM | POA: Diagnosis not present

## 2023-08-22 DIAGNOSIS — Z888 Allergy status to other drugs, medicaments and biological substances status: Secondary | ICD-10-CM | POA: Diagnosis not present

## 2023-08-22 DIAGNOSIS — J440 Chronic obstructive pulmonary disease with acute lower respiratory infection: Secondary | ICD-10-CM | POA: Diagnosis present

## 2023-08-22 DIAGNOSIS — F17210 Nicotine dependence, cigarettes, uncomplicated: Secondary | ICD-10-CM | POA: Diagnosis present

## 2023-08-22 DIAGNOSIS — A419 Sepsis, unspecified organism: Secondary | ICD-10-CM | POA: Diagnosis present

## 2023-08-22 DIAGNOSIS — Z9842 Cataract extraction status, left eye: Secondary | ICD-10-CM | POA: Diagnosis not present

## 2023-08-22 DIAGNOSIS — Z885 Allergy status to narcotic agent status: Secondary | ICD-10-CM | POA: Diagnosis not present

## 2023-08-22 DIAGNOSIS — J9622 Acute and chronic respiratory failure with hypercapnia: Secondary | ICD-10-CM

## 2023-08-22 DIAGNOSIS — Z1152 Encounter for screening for COVID-19: Secondary | ICD-10-CM | POA: Diagnosis not present

## 2023-08-22 DIAGNOSIS — E43 Unspecified severe protein-calorie malnutrition: Secondary | ICD-10-CM | POA: Diagnosis present

## 2023-08-22 DIAGNOSIS — Z8249 Family history of ischemic heart disease and other diseases of the circulatory system: Secondary | ICD-10-CM | POA: Diagnosis not present

## 2023-08-22 DIAGNOSIS — E785 Hyperlipidemia, unspecified: Secondary | ICD-10-CM | POA: Diagnosis present

## 2023-08-22 DIAGNOSIS — J441 Chronic obstructive pulmonary disease with (acute) exacerbation: Secondary | ICD-10-CM | POA: Diagnosis present

## 2023-08-22 DIAGNOSIS — Z9841 Cataract extraction status, right eye: Secondary | ICD-10-CM | POA: Diagnosis not present

## 2023-08-22 LAB — BLOOD CULTURE ID PANEL (REFLEXED) - BCID2

## 2023-08-22 LAB — RESPIRATORY PANEL BY PCR

## 2023-08-22 LAB — I-STAT ARTERIAL BLOOD GAS, ED
Acid-Base Excess: 3 mmol/L — ABNORMAL HIGH (ref 0.0–2.0)
Bicarbonate: 35 mmol/L — ABNORMAL HIGH (ref 20.0–28.0)
Calcium, Ion: 1.29 mmol/L (ref 1.15–1.40)
HCT: 43 % (ref 36.0–46.0)
Hemoglobin: 14.6 g/dL (ref 12.0–15.0)
O2 Saturation: 97 %
Patient temperature: 97.7
Potassium: 4.5 mmol/L (ref 3.5–5.1)
Sodium: 140 mmol/L (ref 135–145)
TCO2: 38 mmol/L — ABNORMAL HIGH (ref 22–32)
pCO2 arterial: 95.1 mm[Hg] (ref 32–48)
pH, Arterial: 7.171 — CL (ref 7.35–7.45)
pO2, Arterial: 122 mm[Hg] — ABNORMAL HIGH (ref 83–108)

## 2023-08-22 LAB — BASIC METABOLIC PANEL
Anion gap: 7 (ref 5–15)
BUN: 15 mg/dL (ref 8–23)
CO2: 27 mmol/L (ref 22–32)
Calcium: 8.3 mg/dL — ABNORMAL LOW (ref 8.9–10.3)
Chloride: 103 mmol/L (ref 98–111)
Creatinine, Ser: 0.57 mg/dL (ref 0.44–1.00)
GFR, Estimated: >60 mL/min (ref >60)
Glucose, Bld: 125 mg/dL — ABNORMAL HIGH (ref 70–99)
Potassium: 5 mmol/L (ref 3.5–5.1)
Sodium: 137 mmol/L (ref 135–145)

## 2023-08-22 LAB — CBC
HCT: 46.2 % — ABNORMAL HIGH (ref 36.0–46.0)
Hemoglobin: 14.7 g/dL (ref 12.0–15.0)
MCH: 30.4 pg (ref 26.0–34.0)
MCHC: 31.8 g/dL (ref 30.0–36.0)
MCV: 95.5 fL (ref 80.0–100.0)
Platelets: 108 10*3/uL — ABNORMAL LOW (ref 150–400)
RBC: 4.84 MIL/uL (ref 3.87–5.11)
RDW: 13.6 % (ref 11.5–15.5)
WBC: 13.7 10*3/uL — ABNORMAL HIGH (ref 4.0–10.5)
nRBC: 0 % (ref 0.0–0.2)

## 2023-08-22 LAB — HIV ANTIBODY (ROUTINE TESTING W REFLEX): HIV Screen 4th Generation wRfx: NONREACTIVE

## 2023-08-22 MED ORDER — MORPHINE SULFATE (CONCENTRATE) 10 MG /0.5 ML PO SOLN: 10.0000 mg | ORAL | 0 refills | Status: DC | PRN

## 2023-08-22 MED ORDER — HALOPERIDOL LACTATE 2 MG/ML PO CONC
0.5000 mg | ORAL | Status: DC | PRN
  Administered 2023-08-22: 0.5 mg via SUBLINGUAL
  Filled 2023-08-22: qty 0.25

## 2023-08-22 MED ORDER — MORPHINE SULFATE (PF) 2 MG/ML IV SOLN: 2.0000 mg | INTRAVENOUS | Status: DC | PRN

## 2023-08-22 MED ORDER — POLYVINYL ALCOHOL 1.4 % OP SOLN: 1.0000 [drp] | Freq: Four times a day (QID) | OPHTHALMIC | Status: DC | PRN

## 2023-08-22 MED ORDER — MORPHINE SULFATE (PF) 2 MG/ML IV SOLN: 1.0000 mg | INTRAVENOUS | Status: DC | PRN

## 2023-08-22 MED ORDER — LACTATED RINGERS IV SOLN: INTRAVENOUS | Status: DC

## 2023-08-22 MED ORDER — ONDANSETRON HCL 4 MG/2ML IJ SOLN: 4.0000 mg | Freq: Four times a day (QID) | INTRAMUSCULAR | Status: DC | PRN

## 2023-08-22 MED ORDER — GLYCOPYRROLATE 1 MG PO TABS: 1.0000 mg | ORAL_TABLET | ORAL | Status: DC | PRN

## 2023-08-22 MED ORDER — ACETAMINOPHEN 650 MG RE SUPP: 650.0000 mg | Freq: Four times a day (QID) | RECTAL | Status: DC | PRN

## 2023-08-22 MED ORDER — HALOPERIDOL 0.5 MG PO TABS: 0.5000 mg | ORAL_TABLET | ORAL | Status: DC | PRN

## 2023-08-22 MED ORDER — ALBUTEROL SULFATE (2.5 MG/3ML) 0.083% IN NEBU: 2.5000 mg | INHALATION_SOLUTION | RESPIRATORY_TRACT | Status: DC | PRN

## 2023-08-22 MED ORDER — ENOXAPARIN SODIUM 40 MG/0.4ML IJ SOSY: 40.0000 mg | PREFILLED_SYRINGE | INTRAMUSCULAR | Status: DC

## 2023-08-22 MED ORDER — GLYCOPYRROLATE 0.2 MG/ML IJ SOLN: 0.2000 mg | INTRAMUSCULAR | Status: DC | PRN

## 2023-08-22 MED ORDER — ONDANSETRON 4 MG PO TBDP: 4.0000 mg | ORAL_TABLET | Freq: Four times a day (QID) | ORAL | Status: DC | PRN

## 2023-08-22 MED ORDER — PREDNISONE 20 MG PO TABS: 40.0000 mg | ORAL_TABLET | Freq: Every day | ORAL | Status: DC

## 2023-08-22 MED ORDER — LORAZEPAM 2 MG/ML IJ SOLN
0.5000 mg | INTRAMUSCULAR | Status: DC | PRN
  Administered 2023-08-22: 0.5 mg via INTRAVENOUS
  Filled 2023-08-22: qty 1

## 2023-08-22 MED ORDER — HALOPERIDOL LACTATE 5 MG/ML IJ SOLN: 0.5000 mg | INTRAMUSCULAR | Status: DC | PRN

## 2023-08-22 MED ORDER — MOMETASONE FURO-FORMOTEROL FUM 200-5 MCG/ACT IN AERO: 2.0000 | INHALATION_SPRAY | Freq: Two times a day (BID) | RESPIRATORY_TRACT | Status: DC

## 2023-08-22 MED ORDER — BIOTENE DRY MOUTH MT LIQD: 15.0000 mL | OROMUCOSAL | Status: DC | PRN

## 2023-08-22 MED ORDER — GLYCOPYRROLATE 1 MG/5ML IJ SOLN: 2.5000 mL | Freq: Three times a day (TID) | INTRAMUSCULAR | 0 refills | Status: DC

## 2023-08-22 MED ORDER — ACETAMINOPHEN 325 MG PO TABS: 650.0000 mg | ORAL_TABLET | Freq: Four times a day (QID) | ORAL | Status: DC | PRN

## 2023-08-22 MED ORDER — SODIUM CHLORIDE 0.9 % IV SOLN: 500.0000 mg | INTRAVENOUS | Status: DC

## 2023-08-22 MED ORDER — SODIUM CHLORIDE 0.9 % IV SOLN: 2.0000 g | INTRAVENOUS | Status: DC

## 2023-08-22 NOTE — ED Notes (Signed)
DO Gardner given results of ABG.

## 2023-08-22 NOTE — Progress Notes (Signed)
OT Cancellation Note  Patient Details Name: Raven Torres MRN: [id number] DOB: [birth year]   Cancelled Treatment:    Reason Eval/Treat Not Completed: Other (comment) (Plan is home with Hospice. OT signing off.)  Xavier Fournier,HILLARY 08/22/2023, 1:59 PM Luisa Dago, OT/L   Acute OT Clinical Specialist Acute Rehabilitation Services Pager [phone] Office [phone]

## 2023-08-22 NOTE — ED Notes (Signed)
DO Julian Reil made aware of BP.

## 2023-08-22 NOTE — ED Notes (Signed)
Micro called to add on respiratory panel.

## 2023-08-22 NOTE — ED Notes (Signed)
DO Gardner at bedside speaking with family following change in mental status.

## 2023-08-22 NOTE — ED Notes (Signed)
[facility] at bedside  

## 2023-08-22 NOTE — ED Notes (Signed)
MD at bedside, speaking to family.

## 2023-08-22 NOTE — Assessment & Plan Note (Signed)
Repeat CT ordered by PCP for future. Deferring CT neck, chest for the moment until pt more stable clinically from a respiratory standpoint.

## 2023-08-22 NOTE — ED Notes (Signed)
Trinna Post, RN called RT to get stat ABG per inpatient provider

## 2023-08-22 NOTE — Progress Notes (Signed)
PT Cancellation Note  Patient Details Name: Raven Torres MRN: [id number] DOB: [birth year]   Cancelled Treatment:    Reason Eval/Treat Not Completed: PT screened, no needs identified, will sign off (GOC are now comfort based. Pt active wtih Hospice at home. Will sign off at this time.)   Renaldo Fiddler PT, DPT Acute Rehabilitation Services Office [phone]  08/22/23 12:18 PM

## 2023-08-22 NOTE — Progress Notes (Signed)
Per family request, RT placed pt back on BiPAP. Pt appears comfortable with SVS at this time. RN notified.

## 2023-08-22 NOTE — ED Notes (Signed)
 CCMD notified. Pt on monitor.

## 2023-08-22 NOTE — ED Notes (Signed)
D/c instructions were given to transport and family. IV was d/c and new set of vital signs taken. Pt is being transported via stretched back home

## 2023-08-22 NOTE — ED Notes (Signed)
Pt found sitting at end of stretcher with 2 family members at bedside, nasal canula off; sats low 80s on RA; family states they just helped pt to use bedpan; pt wanting to get up, states she wants to go home; nasal canula placed back on pt, informed that she could not get up at this time and that we are waiting on transport to take pt home; re-educated pt and family of importance of keeping nasal canula on pt

## 2023-08-22 NOTE — Progress Notes (Signed)
RT at bedside to assess pt. Pt currently on salter. BiPAP on stby at bedside. Family currently at bedside.

## 2023-08-22 NOTE — Progress Notes (Signed)
RN called RT stating pt not tolerating BiPAP at this time. Pt continues to pull mask off. RN removed pt from BiPAP and placed pt on high flow device (salter). Pt tolerating well at this time.

## 2023-08-22 NOTE — ED Notes (Signed)
MD at bedside now discussing plan of care

## 2023-08-22 NOTE — Progress Notes (Signed)
Rt called to pt room for abg results in chart

## 2023-08-22 NOTE — Progress Notes (Signed)
     Referral received for Raven Torres: goals of care discussion. Chart reviewed. Patient is an Physicist, medical hospice patient. Spoke to hospice liaison. They will provide care and reach out to PMT as needed.  Thank you for your referral.   Sarina Ser, NP Palliative Medicine Team  Team Phone # [phone]   NO CHARGE

## 2023-08-22 NOTE — ED Notes (Addendum)
Pt daughter took pt off oxygen and got her up tp try and pee, pt O2 dropped to 56, I place pt on NRB and pt O2 came back up. Educated pt and family of risks and benefits of getting pt out of bed and removing O2. Pt is now back on 2L [place] 02 say is now 96%

## 2023-08-22 NOTE — Progress Notes (Addendum)
   08/22/23 0114  Oxygen Therapy/Pulse Ox  O2 Device HFNC  O2 Flow Rate (L/min) 6 L/min   Pt is on HFNC 6L bipap on standby per md order

## 2023-08-22 NOTE — Progress Notes (Addendum)
[facility] ED05 [facility] Liaison Note  This is a current hospice patient with [employer].  Visited with patient, patients daughter, sister, brother and ex-brother in law at bedside.  Patient expresses she is not ready to go to heaven. understands her disease process, limited options for treatment and expresses she does not want to be on a ventilator and does not desire to remain in the hospital.  We discuss comfort focused care, which she is in agreement with but she is struggling with the decision of returning home or transferring to [facility].  Family and patient continue to have discussions and will let me know when discharge disposition is decided.  Addendum: 1110: Patient and family have opted to return home with hospice support. DME ordered [facility], wheelchair and 2 wheeled walker.  1250: [facility]  has been called, pt is 6th on the list.  Please call with any hospice related questions or concerns.  Thank you, Haynes Bast, BSN, [facility]  [phone]

## 2023-08-22 NOTE — Assessment & Plan Note (Addendum)
Appears to be multifocal / bilateral PNA on CXR COVID, FLU, RSV neg Required rescue BIPAP initially for respiratory distress + hypercapnic failure, didn't tolerate BIPAP well at all, ultimately demanded BIPAP off after a couple of hours.  Tried HFNC but retained more CO2 and became lethargic. Discussed with family and HCPOA over phone: code status = DNI/DNR, they confirmed DNI status and desire with understanding that this illness likely will be terminal without intubation. With that said, even with intubation, I suspect pt will probably not do great as she is so malnourished and frail at baseline. They understand pt likely not going to survive this illness, one sister at bedside, remaining family en-route to hospital. Ill leave the abx orders in for the moment, (next dose isnt due until late this evening anyhow), otherwise pt essentially comfort measures at this point. Also put in for pal care consult. PCCM also saw pt as well and spoke with family.   PNA pathway Rocephin / azithro PRN ativan if needed, though pt pretty lethargic right now. Will put in PRN morphine if needed as well for air hunger Prednisone daily (got solumedrol in ED) if she wakes up  Scheduled LABA and INH steroid PRN SABA Prognosis is poor

## 2023-08-22 NOTE — H&P (Signed)
History and Physical    Patient: Raven Torres ZOX:[id number] DOB: 10-14-1955 DOA: 08/21/2023 DOS: the patient was seen and examined on 08/22/2023 PCP: Marcine Matar, MD  Patient coming from: Home  Chief Complaint:  Chief Complaint  Patient presents with   Respiratory Distress   HPI: KRIMSON FRANZONE is a 68 y.o. 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 Severe protein calorie malnutrition.  Pt is supposedly on hospice currently.  Pt in to ED this evening in respiratory distress.  Brought in by EMS from home patient states that she been short of breath for an hour. Patient oxygen sats were 70% upon EMS arrival.  Tolerated NIPPV very poorly in ED, now on HFNC.  CXR suggests bilateral PNA.   Review of Systems: As mentioned in the history of present illness. All other systems reviewed and are negative. Past Medical History:  Diagnosis Date   Arthritis    Cataract    removed both eyes    CHF (congestive heart failure) (HCC)    Colitis    COPD (chronic obstructive pulmonary disease) (HCC)    Hepatitis C    Hyperlipidemia    Hypertension    Shingles 03/27/2020   TB (tuberculosis)    treatment    Past Surgical History:  Procedure Laterality Date   ABDOMINAL HYSTERECTOMY     CATARACT EXTRACTION, BILATERAL     COLONOSCOPY     COLONOSCOPY  04/04/2020   DIRECT LARYNGOSCOPY N/A 06/26/2020   Procedure: DIRECT LARYNGOSCOPY AND BIOPSY;  Surgeon: Drema Halon, MD;  Location: [facility] OR;  Service: ENT;  Laterality: N/A;   IR GASTROSTOMY TUBE MOD SED  07/27/2020   IR GASTROSTOMY TUBE REMOVAL  04/17/2021   IR IMAGING GUIDED PORT INSERTION  07/27/2020   IR REMOVAL TUN ACCESS W/ PORT W/O FL MOD SED  03/20/2021   KNEE SURGERY Right    POLYPECTOMY  11/17/2008   HPP x 1    Social History:  reports that she has been smoking cigarettes. She started smoking about 54 years ago. She has a 54 pack-year smoking  history. She has never used smokeless tobacco. She reports that she does not drink alcohol and does not use drugs.  Allergies  Allergen Reactions   Tudorza Pressair [Aclidinium Bromide] Itching   Amitriptyline Other (See Comments)    Eye spasms per pt   Codeine Nausea Only   Tramadol Nausea Only    Family History  Problem Relation Age of Onset   Heart disease Mother    Cancer Mother    Melanoma Mother    Esophageal cancer Father    Colon cancer Neg Hx    Colon polyps Neg Hx    Rectal cancer Neg Hx    Stomach cancer Neg Hx     Prior to Admission medications   Medication Sig Start Date End Date Taking? Authorizing Provider  albuterol (VENTOLIN HFA) 108 (90 Base) MCG/ACT inhaler Inhale 2 puffs into the lungs every 6 (six) hours as needed for wheezing or shortness of breath. 01/09/23  Yes Marcine Matar, MD  ALPRAZolam Prudy Feeler) 0.5 MG tablet Take 0.5 mg by mouth every 4 (four) hours as needed. 08/14/23  Yes [provider]  aspirin 81 MG chewable tablet Chew 1 tablet (81 mg total) by mouth daily. 02/08/22  Yes Drema Dallas, MD  traZODone (DESYREL) 50 MG tablet Take 50 mg by mouth at bedtime. 08/14/23  Yes [provider]  TUSSIN DM MAX 20-400 MG/20ML LIQD Take 20 mLs by mouth in the morning and at bedtime. 07/18/23  Yes [provider]  albuterol (PROVENTIL) (2.5 MG/3ML) 0.083% nebulizer solution Take 3 mLs (2.5 mg total) by nebulization every 6 (six) hours as needed for wheezing or shortness of breath. 05/11/20   Marcine Matar, MD  cyclobenzaprine (FLEXERIL) 5 MG tablet Take 5 mg by mouth 3 (three) times daily as needed for muscle spasms.    [provider]  feeding supplement (ENSURE ENLIVE / ENSURE PLUS) LIQD Take 237 mLs by mouth 5 (five) times daily. 02/05/22   Glade Lloyd, MD  Varenicline Tartrate, Starter, (CHANTIX STARTING MONTH PAK) 0.5 MG X 11 & 1 MG X 42 TBPK PO 0.5 mg daily for day 1-3 then BID for days 4-7 then 1 mg BID Patient not  taking: Reported on 08/21/2023 01/10/23   Marcine Matar, MD    Physical Exam: Vitals:   08/22/23 0145 08/22/23 0150 08/22/23 0200 08/22/23 0215  BP: (!) 87/48  106/62 (!) 90/47  Pulse: 88 91 97 91  Resp: (!) 23 (!) 23 (!) 23 (!) 24  Temp:      TempSrc:      SpO2: 97% 95% 94% 91%   Constitutional: thin and emaciated, lethargic Respiratory: Wet BS bilaterally, crackles. Cardiovascular: Regular rate and rhythm, no murmurs / rubs / gallops. No extremity edema. 2+ pedal pulses. No carotid bruits.  Abdomen: no tenderness, no masses palpated. No hepatosplenomegaly. Bowel sounds positive. Neurologic: Originally AAOx3, MAE, normal verbal status, but neurologic status has deteriorated over past couple of hours as pt has become more lethargic with CO2 retention.  Data Reviewed:    Labs on Admission: I have personally reviewed following labs and imaging studies  CBC: Recent Labs  Lab 08/21/23 1811 08/21/23 1819  WBC 12.5*  --   HGB 16.1* 17.0*  HCT 49.8* 50.0*  MCV 94.3  --   PLT 186  --    Basic Metabolic Panel: Recent Labs  Lab 08/21/23 1811 08/21/23 1819  NA 137 138  K 4.1 4.1  CL 97*  --   CO2 31  --   GLUCOSE 116*  --   BUN 14  --   CREATININE 0.75  --   CALCIUM 9.1  --    GFR: CrCl cannot be calculated (Unknown ideal weight.). Liver Function Tests: Recent Labs  Lab 08/21/23 1811  AST 37  ALT 19  ALKPHOS 58  BILITOT 0.6  PROT 7.6  ALBUMIN 3.7   No results for input(s): "LIPASE", "AMYLASE" in the last 168 hours. No results for input(s): "AMMONIA" in the last 168 hours. Coagulation Profile: No results for input(s): "INR", "PROTIME" in the last 168 hours. Cardiac Enzymes: No results for input(s): "CKTOTAL", "CKMB", "CKMBINDEX", "TROPONINI" in the last 168 hours. BNP (last 3 results) No results for input(s): "PROBNP" in the last 8760 hours. HbA1C: No results for input(s): "HGBA1C" in the last 72 hours. CBG: No results for input(s): "GLUCAP" in the last  168 hours. Lipid Profile: No results for input(s): "CHOL", "HDL", "LDLCALC", "TRIG", "CHOLHDL", "LDLDIRECT" in the last 72 hours. Thyroid Function Tests: No results for input(s): "TSH", "T4TOTAL", "FREET4", "T3FREE", "THYROIDAB" in the last 72 hours. Anemia Panel: No results for input(s): "VITAMINB12", "FOLATE", "FERRITIN", "TIBC", "IRON", "RETICCTPCT" in the last 72 hours. Urine analysis:    Component Value Date/Time   COLORURINE YELLOW 09/12/2020 2230   APPEARANCEUR Clear 04/13/2021 1207   LABSPEC 1.028 09/12/2020 2230   PHURINE  5.0 09/12/2020 2230   GLUCOSEU Negative 04/13/2021 1207   HGBUR NEGATIVE 09/12/2020 2230   BILIRUBINUR Negative 04/13/2021 1207   KETONESUR negative 03/01/2021 0920   KETONESUR 20 (A) 09/12/2020 2230   PROTEINUR Negative 04/13/2021 1207   PROTEINUR 30 (A) 09/12/2020 2230   UROBILINOGEN 0.2 03/01/2021 0920   NITRITE Negative 04/13/2021 1207   NITRITE NEGATIVE 09/12/2020 2230   LEUKOCYTESUR Negative 04/13/2021 1207   LEUKOCYTESUR NEGATIVE 09/12/2020 2230    Radiological Exams on Admission: DG Chest Port 1 View Result Date: 08/21/2023 CLINICAL DATA:  Shortness of breath, respiratory distress EXAM: PORTABLE CHEST 1 VIEW COMPARISON:  CT chest dated 04/24/2022 FINDINGS: Mild patchy right lower lung and left lower lobe opacities, suspicious for pneumonia. No pleural effusion or pneumothorax. The heart is normal in size.  Thoracic aortic atherosclerosis. IMPRESSION: Bilateral lower lung pneumonia. Electronically Signed   By: Charline Bills M.D.   On: 08/21/2023 21:07    EKG: Independently reviewed.   Assessment and Plan: * Acute on chronic respiratory failure with hypoxia and hypercapnia (HCC) Appears to be multifocal / bilateral PNA on CXR COVID, FLU, RSV neg Required rescue BIPAP initially for respiratory distress + hypercapnic failure, didn't tolerate BIPAP well at all, ultimately demanded BIPAP off after a couple of hours.  Tried HFNC but retained  more CO2 and became lethargic. Discussed with family and HCPOA over phone: code status = DNI/DNR, they confirmed DNI status and desire with understanding that this illness likely will be terminal without intubation. With that said, even with intubation, I suspect pt will probably not do great as she is so malnourished and frail at baseline. They understand pt likely not going to survive this illness, one sister at bedside, remaining family en-route to hospital. Ill leave the abx orders in for the moment, (next dose isnt due until late this evening anyhow), otherwise pt essentially comfort measures at this point. Also put in for pal care consult. PCCM also saw pt as well and spoke with family.   PNA pathway Rocephin / azithro PRN ativan if needed, though pt pretty lethargic right now. Will put in PRN morphine if needed as well for air hunger Prednisone daily (got solumedrol in ED) if she wakes up  Scheduled LABA and INH steroid PRN SABA Prognosis is poor  Malignant neoplasm of supraglottis (HCC) Repeat CT ordered by PCP for future. Deferring CT neck, chest for the moment until pt more stable clinically from a respiratory standpoint.      Advance Care Planning:   Code Status: Limited: Do not attempt resuscitation (DNR) -DNR-LIMITED -Do Not Intubate/DNI    Consults: PCCM, pal care consult put into epic as well  Family Communication: Sister at bedside, spoke to second sister who identified herself as POA / Product manager over phone.  Severity of Illness: The appropriate patient status for this patient is INPATIENT. Inpatient status is judged to be reasonable and necessary in order to provide the required intensity of service to ensure the patient's safety. The patient's presenting symptoms, physical exam findings, and initial radiographic and laboratory data in the context of their chronic comorbidities is felt to place them at high risk for further clinical deterioration. Furthermore, it is not  anticipated that the patient will be medically stable for discharge from the hospital within 2 midnights of admission.   * I certify that at the point of admission it is my clinical judgment that the patient will require inpatient hospital care spanning beyond 2 midnights from the point of admission  due to high intensity of service, high risk for further deterioration and high frequency of surveillance required.*  Author: Hillary Bow., DO 08/22/2023 2:31 AM  For on call review [email or web address].

## 2023-08-22 NOTE — Discharge Summary (Addendum)
Physician Discharge Summary   Patient: Raven Torres MRN: [id number] DOB: 22-Aug-1955  Admit date:     08/21/2023  Discharge date: 08/22/23  Discharge Physician: Marolyn Haller   PCP: Marcine Matar, MD     Discharge Diagnoses: Principal Problem:   Acute on chronic respiratory failure with hypoxia and hypercapnia (HCC) Active Problems:   Lung nodules   Centrilobular emphysema (HCC)   Malignant neoplasm of supraglottis (HCC)  Resolved Problems:   * No resolved hospital problems. *  Hospital Course: No notes on file  Assessment and Plan: * Acute on chronic respiratory failure with hypoxia and hypercapnia (HCC) Sepsis due to bilateral lower lobe pna  Appeared to  have multifocal / bilateral PNA on CXR COVID, FLU, RSV neg. She was tachycardic, tachypneic, and had leukocytosis. She was started on IV antibiotics and required BIPAP as noted per below.  Required rescue BIPAP initially for respiratory distress + hypercapnic failure, didn't tolerate BIPAP well, ultimately took BIPAP off after a couple of hours.  Tried HFNC but retained more CO2 and became lethargic. Admitting team Discussed with family and HCPOA over phone: code status = DNI/DNR, they confirmed DNI status and desire with understanding that this illness likely will be terminal without intubation. Patient eventually became more awake and was able to participate in conversations regarding her care. Her sister, daughter, brother, and close family friend were at bedside along with hospitalist and hospice team. Patient was trialed on BIPAP again and did not tolerate this.   The patient along with her family ultimately decided that she should be made comfort care and discharge home. Appreciate the assistance of the hospice team in helping to accommodate patient and her family.    Malignant neoplasm of supraglottis (HCC) Repeat CT ordered by PCP for future.         Consultants: Palliative/hospice  Procedures  performed: None  Disposition:  Home with hospice  Diet recommendation:  Regular diet DISCHARGE MEDICATION: Allergies as of 08/22/2023       Reactions   Tudorza Pressair [aclidinium Bromide] Itching   Amitriptyline Other (See Comments)   Eye spasms per pt   Codeine Nausea Only   Tramadol Nausea Only        Medication List     STOP taking these medications    aspirin 81 MG chewable tablet   feeding supplement Liqd   traZODone 50 MG tablet Commonly known as: DESYREL   Tussin DM Max 20-400 MG/20ML Liqd Generic drug: Dextromethorphan-guaiFENesin   Varenicline Tartrate (Starter) 0.5 MG X 11 & 1 MG X 42 Tbpk Commonly known as: Chantix Starting Month Pak       TAKE these medications    albuterol (2.5 MG/3ML) 0.083% nebulizer solution Commonly known as: PROVENTIL Take 3 mLs (2.5 mg total) by nebulization every 6 (six) hours as needed for wheezing or shortness of breath.   albuterol 108 (90 Base) MCG/ACT inhaler Commonly known as: VENTOLIN HFA Inhale 2 puffs into the lungs every 6 (six) hours as needed for wheezing or shortness of breath.   ALPRAZolam 0.5 MG tablet Commonly known as: XANAX Take 0.5 mg by mouth every 4 (four) hours as needed.   cyclobenzaprine 5 MG tablet Commonly known as: FLEXERIL Take 5 mg by mouth 3 (three) times daily as needed for muscle spasms.   Glycopyrrolate 1 MG/5ML Soln Inject 2.5 mLs as directed 3 (three) times daily.   morphine CONCENTRATE 10 mg / 0.5 ml concentrated solution Take 0.5 mLs (10 mg total) by  mouth every 2 (two) hours as needed for severe pain (pain score 7-10) or shortness of breath.        Discharge Exam: There were no vitals filed for this visit. Physical Exam  Constitutional: In no distress. Cachectic, chronically ill appearing  Cardiovascular: Normal rate, regular rhythm. No lower extremity edema  Pulmonary: Non labored breathing on room air, no wheezing or rales.   Abdominal: Soft. Normal bowel sounds. Non  distended and non tender  Skin: Skin is warm and dry.    Condition at discharge: serious  The results of significant diagnostics from this hospitalization (including imaging, microbiology, ancillary and laboratory) are listed below for reference.   Imaging Studies: DG Chest Port 1 View Result Date: 08/21/2023 CLINICAL DATA:  Shortness of breath, respiratory distress EXAM: PORTABLE CHEST 1 VIEW COMPARISON:  CT chest dated 04/24/2022 FINDINGS: Mild patchy right lower lung and left lower lobe opacities, suspicious for pneumonia. No pleural effusion or pneumothorax. The heart is normal in size.  Thoracic aortic atherosclerosis. IMPRESSION: Bilateral lower lung pneumonia. Electronically Signed   By: Charline Bills M.D.   On: 08/21/2023 21:07    Microbiology: Results for orders placed or performed during the hospital encounter of 08/21/23  Resp panel by RT-PCR (RSV, Flu A&B, Covid) Anterior Nasal Swab     Status: None   Collection Time: 08/21/23  6:05 PM   Specimen: Anterior Nasal Swab  Result Value Ref Range Status   SARS Coronavirus 2 by RT PCR NEGATIVE NEGATIVE Final   Influenza A by PCR NEGATIVE NEGATIVE Final   Influenza B by PCR NEGATIVE NEGATIVE Final    Comment: (NOTE) The Xpert Xpress SARS-CoV-2/FLU/RSV plus assay is intended as an aid in the diagnosis of influenza from Nasopharyngeal swab specimens and should not be used as a sole basis for treatment. Nasal washings and aspirates are unacceptable for Xpert Xpress SARS-CoV-2/FLU/RSV testing.  Fact Sheet for Patients: BloggerCourse.com  Fact Sheet for Healthcare Providers: SeriousBroker.it  This test is not yet approved or cleared by the [place] FDA and has been authorized for detection and/or diagnosis of SARS-CoV-2 by FDA under an Emergency Use Authorization (EUA). This EUA will remain in effect (meaning this test can be used) for the duration of the COVID-19  declaration under Section 564(b)(1) of the Act, 21 U.S.C. section 360bbb-3(b)(1), unless the authorization is terminated or revoked.     Resp Syncytial Virus by PCR NEGATIVE NEGATIVE Final    Comment: (NOTE) Fact Sheet for Patients: BloggerCourse.com  Fact Sheet for Healthcare Providers: SeriousBroker.it  This test is not yet approved or cleared by the [place] FDA and has been authorized for detection and/or diagnosis of SARS-CoV-2 by FDA under an Emergency Use Authorization (EUA). This EUA will remain in effect (meaning this test can be used) for the duration of the COVID-19 declaration under Section 564(b)(1) of the Act, 21 U.S.C. section 360bbb-3(b)(1), unless the authorization is terminated or revoked.  Performed at [facility], [address] [place], [place] [address]   Respiratory (~20 pathogens) panel by PCR     Status: None   Collection Time: 08/21/23  6:05 PM   Specimen: Nasopharyngeal Swab; Respiratory  Result Value Ref Range Status   Adenovirus NOT DETECTED NOT DETECTED Final   Coronavirus 229E NOT DETECTED NOT DETECTED Final    Comment: (NOTE) The Coronavirus on the Respiratory Panel, DOES NOT test for the novel  Coronavirus (2019 nCoV)    Coronavirus HKU1 NOT DETECTED NOT DETECTED Final  Coronavirus NL63 NOT DETECTED NOT DETECTED Final   Coronavirus OC43 NOT DETECTED NOT DETECTED Final   Metapneumovirus NOT DETECTED NOT DETECTED Final   Rhinovirus / Enterovirus NOT DETECTED NOT DETECTED Final   Influenza A NOT DETECTED NOT DETECTED Final   Influenza B NOT DETECTED NOT DETECTED Final   Parainfluenza Virus 1 NOT DETECTED NOT DETECTED Final   Parainfluenza Virus 2 NOT DETECTED NOT DETECTED Final   Parainfluenza Virus 3 NOT DETECTED NOT DETECTED Final   Parainfluenza Virus 4 NOT DETECTED NOT DETECTED Final   Respiratory Syncytial Virus NOT DETECTED NOT DETECTED Final   Bordetella pertussis NOT  DETECTED NOT DETECTED Final   Bordetella Parapertussis NOT DETECTED NOT DETECTED Final   Chlamydophila pneumoniae NOT DETECTED NOT DETECTED Final   Mycoplasma pneumoniae NOT DETECTED NOT DETECTED Final    Comment: Performed at [facility], [address], [place], [place] [address]  Culture, blood (Routine X 2) w Reflex to ID Panel     Status: None (Preliminary result)   Collection Time: 08/21/23  9:21 PM   Specimen: BLOOD LEFT FOREARM  Result Value Ref Range Status   Specimen Description BLOOD LEFT FOREARM  Final   Special Requests   Final    BOTTLES DRAWN AEROBIC AND ANAEROBIC Blood Culture results may not be optimal due to an inadequate volume of blood received in culture bottles   Culture   Final    NO GROWTH < 12 HOURS Performed at [facility], [address], [place], [place] [address]    Report Status PENDING  Incomplete  Culture, blood (Routine X 2) w Reflex to ID Panel     Status: None (Preliminary result)   Collection Time: 08/21/23 11:27 PM   Specimen: BLOOD RIGHT FOREARM  Result Value Ref Range Status   Specimen Description BLOOD RIGHT FOREARM  Final   Special Requests   Final    BOTTLES DRAWN AEROBIC AND ANAEROBIC Blood Culture adequate volume   Culture   Final    NO GROWTH < 12 HOURS Performed at [facility], [address] [place], [place] [address]    Report Status PENDING  Incomplete    Labs: CBC: Recent Labs  Lab 08/21/23 1811 08/21/23 1819 08/22/23 0258 08/22/23 0537  WBC 12.5*  --   --  13.7*  HGB 16.1* 17.0* 14.6 14.7  HCT 49.8* 50.0* 43.0 46.2*  MCV 94.3  --   --  95.5  PLT 186  --   --  108*   Basic Metabolic Panel: Recent Labs  Lab 08/21/23 1811 08/21/23 1819 08/22/23 0258 08/22/23 0537  NA 137 138 140 137  K 4.1 4.1 4.5 5.0  CL 97*  --   --  103  CO2 31  --   --  27  GLUCOSE 116*  --   --  125*  BUN 14  --   --  15  CREATININE 0.75  --   --  0.57  CALCIUM 9.1  --   --  8.3*   Liver Function Tests: Recent Labs   Lab 08/21/23 1811  AST 37  ALT 19  ALKPHOS 58  BILITOT 0.6  PROT 7.6  ALBUMIN 3.7   CBG: No results for input(s): "GLUCAP" in the last 168 hours.  Discharge time spent: greater than 30 minutes.  Signed: Marolyn Haller, MD [facility] 08/22/2023

## 2023-08-24 LAB — CULTURE, BLOOD (ROUTINE X 2)

## 2023-08-25 ENCOUNTER — Telehealth: Payer: Self-pay

## 2023-08-25 NOTE — Transitions of Care (Post Inpatient/ED Visit) (Signed)
   08/25/2023  Name: JOULES PRUDENCIO MRN: [id number] DOB: 1956/07/13  Today's TOC FU Call Status: Today's TOC FU Call Status:: Unsuccessful Call (1st Attempt) Unsuccessful Call (1st Attempt) Date: 08/25/23  Attempted to reach the patient regarding the most recent Inpatient/ED visit.  Follow Up Plan: Additional outreach attempts will be made to reach the patient to complete the Transitions of Care (Post Inpatient/ED visit) call.   Alyse Low, RN, BA, [facility] CRRN [facility] Coordinator, Transition of Care Ph # [phone]

## 2023-08-26 ENCOUNTER — Telehealth: Payer: Self-pay

## 2023-08-26 LAB — CULTURE, BLOOD (ROUTINE X 2)
Culture: NO GROWTH
Special Requests: ADEQUATE

## 2023-08-26 NOTE — Transitions of Care (Post Inpatient/ED Visit) (Signed)
   08/21/2023  Name: Raven Torres MRN: [id number] DOB: 1955-12-28  Today's TOC FU Call Status: Today's TOC FU Call Status:: Successful TOC FU Call Completed TOC FU Call Complete Date: 08/15/2023 Patient's Name and Date of Birth confirmed.  Transition Care Management Follow-up Telephone Call Date of Discharge: 08/22/23 Discharge Facility: [facility]) Type of Discharge: Inpatient Admission Primary Inpatient Discharge Diagnosis:: Acute Respiratory Failure w/ hypoxia  Outcome of call Patient's sister, Raven Torres confirmed that patient and family had opted for hospice care and that patient, Raven Torres. Raven Torres was no longer verbally communicating and was comfortably transitioning from this life.  Alyse Low, RN, BA, [facility], CRRN [facility] Coordinator, Transition of Care Ph # [phone]

## 2023-09-06 DEATH — deceased

## 2023-09-30 ENCOUNTER — Telehealth: Payer: Self-pay | Admitting: Internal Medicine

## 2023-09-30 NOTE — Telephone Encounter (Signed)
 Copied from CRM [id number]. Topic: General - Deceased Patient >> 2023/10/20  5:24 PM Thomes Dinning wrote: Name of caller: Tyson Babinski [phone]  Date of death: 2023/09/16   Name of funeral home: [employer] [facility]  Phone number of funeral home: [phone]  Provider that needs to sign form: N/A  Timeline for signing: N/A

## 2023-10-01 NOTE — Telephone Encounter (Signed)
 Pt is deceased. Please change status in system and cancel any future appointments.

## 2023-10-01 NOTE — Telephone Encounter (Signed)
 Phone call returned to patient's sister Meriam Sprague today.  I extended my condolences to her and her family.  I inquired whether I needed to sign the death certificate.  She said that the death certificate was already completed.  Patient died under hospice care and was cremated.

## 2024-01-27 NOTE — Progress Notes (Unsigned)
 Created in error
